# Patient Record
Sex: Female | Born: 1935 | Race: White | Hispanic: No | Smoking: Never smoker
Health system: Southern US, Community
[De-identification: ages and names within clinical notes are randomized; demographics above are authoritative.]

## PROBLEM LIST (undated history)

## (undated) DIAGNOSIS — M6281 Muscle weakness (generalized): Secondary | ICD-10-CM

## (undated) DIAGNOSIS — J9610 Chronic respiratory failure, unspecified whether with hypoxia or hypercapnia: Secondary | ICD-10-CM

## (undated) DIAGNOSIS — F0390 Unspecified dementia without behavioral disturbance: Secondary | ICD-10-CM

## (undated) DIAGNOSIS — K317 Polyp of stomach and duodenum: Secondary | ICD-10-CM

## (undated) DIAGNOSIS — F32A Depression, unspecified: Secondary | ICD-10-CM

## (undated) DIAGNOSIS — R41841 Cognitive communication deficit: Secondary | ICD-10-CM

## (undated) DIAGNOSIS — K219 Gastro-esophageal reflux disease without esophagitis: Secondary | ICD-10-CM

## (undated) DIAGNOSIS — E7800 Pure hypercholesterolemia, unspecified: Secondary | ICD-10-CM

## (undated) DIAGNOSIS — F329 Major depressive disorder, single episode, unspecified: Secondary | ICD-10-CM

## (undated) DIAGNOSIS — J159 Unspecified bacterial pneumonia: Secondary | ICD-10-CM

## (undated) DIAGNOSIS — I4891 Unspecified atrial fibrillation: Principal | ICD-10-CM

## (undated) DIAGNOSIS — G608 Other hereditary and idiopathic neuropathies: Secondary | ICD-10-CM

## (undated) DIAGNOSIS — K5790 Diverticulosis of intestine, part unspecified, without perforation or abscess without bleeding: Secondary | ICD-10-CM

## (undated) DIAGNOSIS — E559 Vitamin D deficiency, unspecified: Secondary | ICD-10-CM

## (undated) DIAGNOSIS — M0520 Rheumatoid vasculitis with rheumatoid arthritis of unspecified site: Secondary | ICD-10-CM

## (undated) DIAGNOSIS — M810 Age-related osteoporosis without current pathological fracture: Secondary | ICD-10-CM

## (undated) DIAGNOSIS — J449 Chronic obstructive pulmonary disease, unspecified: Secondary | ICD-10-CM

## (undated) DIAGNOSIS — K648 Other hemorrhoids: Secondary | ICD-10-CM

## (undated) DIAGNOSIS — I10 Essential (primary) hypertension: Secondary | ICD-10-CM

## (undated) DIAGNOSIS — Z9981 Dependence on supplemental oxygen: Secondary | ICD-10-CM

## (undated) DIAGNOSIS — F411 Generalized anxiety disorder: Secondary | ICD-10-CM

## (undated) DIAGNOSIS — G629 Polyneuropathy, unspecified: Secondary | ICD-10-CM

## (undated) DIAGNOSIS — IMO0002 Reserved for concepts with insufficient information to code with codable children: Secondary | ICD-10-CM

## (undated) DIAGNOSIS — M549 Dorsalgia, unspecified: Secondary | ICD-10-CM

## (undated) DIAGNOSIS — E119 Type 2 diabetes mellitus without complications: Secondary | ICD-10-CM

## (undated) DIAGNOSIS — G4700 Insomnia, unspecified: Secondary | ICD-10-CM

## (undated) DIAGNOSIS — C801 Malignant (primary) neoplasm, unspecified: Secondary | ICD-10-CM

## (undated) DIAGNOSIS — K760 Fatty (change of) liver, not elsewhere classified: Secondary | ICD-10-CM

## (undated) DIAGNOSIS — D649 Anemia, unspecified: Secondary | ICD-10-CM

## (undated) DIAGNOSIS — M797 Fibromyalgia: Secondary | ICD-10-CM

## (undated) HISTORY — DX: Polyp of stomach and duodenum: K31.7

## (undated) HISTORY — DX: Vitamin D deficiency, unspecified: E55.9

## (undated) HISTORY — DX: Other hereditary and idiopathic neuropathies: G60.8

## (undated) HISTORY — DX: Unspecified bacterial pneumonia: J15.9

## (undated) HISTORY — PX: ABDOMINAL SURGERY: SHX537

## (undated) HISTORY — DX: Diverticulosis of intestine, part unspecified, without perforation or abscess without bleeding: K57.90

## (undated) HISTORY — PX: ABDOMINAL HYSTERECTOMY: SHX81

## (undated) HISTORY — PX: UPPER GASTROINTESTINAL ENDOSCOPY: SHX188

## (undated) HISTORY — DX: Generalized anxiety disorder: F41.1

## (undated) HISTORY — DX: Other hemorrhoids: K64.8

## (undated) HISTORY — DX: Gastro-esophageal reflux disease without esophagitis: K21.9

## (undated) HISTORY — DX: Fatty (change of) liver, not elsewhere classified: K76.0

## (undated) HISTORY — DX: Reserved for concepts with insufficient information to code with codable children: IMO0002

---

## 2000-09-12 ENCOUNTER — Encounter (HOSPITAL_COMMUNITY): Admission: RE | Admit: 2000-09-12 | Discharge: 2000-10-12 | Payer: Self-pay | Admitting: Neurological Surgery

## 2000-10-16 ENCOUNTER — Encounter (HOSPITAL_COMMUNITY): Admission: RE | Admit: 2000-10-16 | Discharge: 2000-11-15 | Payer: Self-pay | Admitting: Neurological Surgery

## 2000-12-02 ENCOUNTER — Emergency Department (HOSPITAL_COMMUNITY): Admission: EM | Admit: 2000-12-02 | Discharge: 2000-12-02 | Payer: Self-pay | Admitting: Emergency Medicine

## 2000-12-05 ENCOUNTER — Ambulatory Visit (HOSPITAL_COMMUNITY): Admission: RE | Admit: 2000-12-05 | Discharge: 2000-12-05 | Payer: Self-pay | Admitting: Family Medicine

## 2000-12-05 ENCOUNTER — Encounter: Payer: Self-pay | Admitting: Family Medicine

## 2001-01-31 ENCOUNTER — Encounter: Payer: Self-pay | Admitting: Family Medicine

## 2001-01-31 ENCOUNTER — Ambulatory Visit (HOSPITAL_COMMUNITY): Admission: RE | Admit: 2001-01-31 | Discharge: 2001-01-31 | Payer: Self-pay | Admitting: Family Medicine

## 2001-02-12 ENCOUNTER — Other Ambulatory Visit: Admission: RE | Admit: 2001-02-12 | Discharge: 2001-02-12 | Payer: Self-pay | Admitting: Family Medicine

## 2001-04-14 HISTORY — PX: COLONOSCOPY: SHX174

## 2001-04-17 ENCOUNTER — Ambulatory Visit (HOSPITAL_COMMUNITY): Admission: RE | Admit: 2001-04-17 | Discharge: 2001-04-17 | Payer: Self-pay | Admitting: Internal Medicine

## 2002-05-08 ENCOUNTER — Ambulatory Visit (HOSPITAL_COMMUNITY): Admission: RE | Admit: 2002-05-08 | Discharge: 2002-05-08 | Payer: Self-pay | Admitting: Family Medicine

## 2002-05-08 ENCOUNTER — Encounter: Payer: Self-pay | Admitting: Family Medicine

## 2003-01-20 ENCOUNTER — Ambulatory Visit (HOSPITAL_COMMUNITY): Admission: RE | Admit: 2003-01-20 | Discharge: 2003-01-20 | Payer: Self-pay | Admitting: Family Medicine

## 2003-09-19 ENCOUNTER — Ambulatory Visit (HOSPITAL_COMMUNITY): Admission: RE | Admit: 2003-09-19 | Discharge: 2003-09-19 | Payer: Self-pay | Admitting: Family Medicine

## 2003-12-11 ENCOUNTER — Ambulatory Visit (HOSPITAL_COMMUNITY): Admission: RE | Admit: 2003-12-11 | Discharge: 2003-12-11 | Payer: Self-pay | Admitting: *Deleted

## 2006-03-06 ENCOUNTER — Ambulatory Visit (HOSPITAL_COMMUNITY): Admission: RE | Admit: 2006-03-06 | Discharge: 2006-03-06 | Payer: Self-pay | Admitting: Family Medicine

## 2006-03-09 ENCOUNTER — Ambulatory Visit (HOSPITAL_COMMUNITY): Admission: RE | Admit: 2006-03-09 | Discharge: 2006-03-09 | Payer: Self-pay | Admitting: Family Medicine

## 2006-04-25 ENCOUNTER — Ambulatory Visit (HOSPITAL_COMMUNITY): Admission: RE | Admit: 2006-04-25 | Discharge: 2006-04-25 | Payer: Self-pay | Admitting: Family Medicine

## 2006-06-29 ENCOUNTER — Emergency Department (HOSPITAL_COMMUNITY): Admission: EM | Admit: 2006-06-29 | Discharge: 2006-06-29 | Payer: Self-pay | Admitting: Emergency Medicine

## 2006-07-04 ENCOUNTER — Emergency Department (HOSPITAL_COMMUNITY): Admission: EM | Admit: 2006-07-04 | Discharge: 2006-07-04 | Payer: Self-pay | Admitting: Emergency Medicine

## 2006-07-05 ENCOUNTER — Ambulatory Visit: Payer: Self-pay | Admitting: Orthopedic Surgery

## 2006-07-26 ENCOUNTER — Ambulatory Visit (HOSPITAL_COMMUNITY): Admission: RE | Admit: 2006-07-26 | Discharge: 2006-07-26 | Payer: Self-pay | Admitting: Family Medicine

## 2007-01-04 ENCOUNTER — Ambulatory Visit (HOSPITAL_COMMUNITY): Admission: RE | Admit: 2007-01-04 | Discharge: 2007-01-04 | Payer: Self-pay | Admitting: Family Medicine

## 2007-02-11 ENCOUNTER — Emergency Department (HOSPITAL_COMMUNITY): Admission: EM | Admit: 2007-02-11 | Discharge: 2007-02-11 | Payer: Self-pay | Admitting: Emergency Medicine

## 2007-06-01 ENCOUNTER — Ambulatory Visit (HOSPITAL_COMMUNITY): Admission: RE | Admit: 2007-06-01 | Discharge: 2007-06-01 | Payer: Self-pay | Admitting: Family Medicine

## 2008-03-19 ENCOUNTER — Ambulatory Visit (HOSPITAL_COMMUNITY): Admission: RE | Admit: 2008-03-19 | Discharge: 2008-03-19 | Payer: Self-pay | Admitting: Family Medicine

## 2008-04-12 ENCOUNTER — Inpatient Hospital Stay (HOSPITAL_COMMUNITY): Admission: EM | Admit: 2008-04-12 | Discharge: 2008-04-15 | Payer: Self-pay | Admitting: Emergency Medicine

## 2008-06-11 ENCOUNTER — Ambulatory Visit (HOSPITAL_COMMUNITY): Admission: RE | Admit: 2008-06-11 | Discharge: 2008-06-11 | Payer: Self-pay | Admitting: Ophthalmology

## 2008-07-02 ENCOUNTER — Ambulatory Visit (HOSPITAL_COMMUNITY): Admission: RE | Admit: 2008-07-02 | Discharge: 2008-07-02 | Payer: Self-pay | Admitting: Ophthalmology

## 2008-11-16 ENCOUNTER — Inpatient Hospital Stay (HOSPITAL_COMMUNITY): Admission: EM | Admit: 2008-11-16 | Discharge: 2008-11-19 | Payer: Self-pay | Admitting: Pulmonary Disease

## 2008-11-16 ENCOUNTER — Ambulatory Visit: Payer: Self-pay | Admitting: Pulmonary Disease

## 2008-12-11 ENCOUNTER — Ambulatory Visit (HOSPITAL_COMMUNITY): Admission: RE | Admit: 2008-12-11 | Discharge: 2008-12-11 | Payer: Self-pay | Admitting: Rheumatology

## 2008-12-17 ENCOUNTER — Encounter: Admission: RE | Admit: 2008-12-17 | Discharge: 2008-12-17 | Payer: Self-pay | Admitting: Unknown Physician Specialty

## 2008-12-30 ENCOUNTER — Ambulatory Visit: Admission: RE | Admit: 2008-12-30 | Discharge: 2008-12-30 | Payer: Self-pay | Admitting: Internal Medicine

## 2009-07-06 ENCOUNTER — Emergency Department (HOSPITAL_COMMUNITY): Admission: EM | Admit: 2009-07-06 | Discharge: 2009-07-06 | Payer: Self-pay | Admitting: Emergency Medicine

## 2009-07-07 ENCOUNTER — Emergency Department (HOSPITAL_COMMUNITY): Admission: EM | Admit: 2009-07-07 | Discharge: 2009-07-07 | Payer: Self-pay | Admitting: Emergency Medicine

## 2009-07-23 ENCOUNTER — Ambulatory Visit (HOSPITAL_COMMUNITY): Admission: RE | Admit: 2009-07-23 | Discharge: 2009-07-23 | Payer: Self-pay | Admitting: Internal Medicine

## 2009-07-23 ENCOUNTER — Ambulatory Visit (HOSPITAL_COMMUNITY)
Admission: RE | Admit: 2009-07-23 | Discharge: 2009-07-23 | Payer: Self-pay | Source: Home / Self Care | Admitting: Internal Medicine

## 2009-08-03 ENCOUNTER — Ambulatory Visit (HOSPITAL_COMMUNITY)
Admission: RE | Admit: 2009-08-03 | Discharge: 2009-08-03 | Payer: Self-pay | Source: Home / Self Care | Admitting: Internal Medicine

## 2009-08-05 ENCOUNTER — Ambulatory Visit: Payer: Self-pay | Admitting: Thoracic Surgery

## 2009-10-06 ENCOUNTER — Encounter: Admission: RE | Admit: 2009-10-06 | Discharge: 2009-10-06 | Payer: Self-pay | Admitting: Thoracic Surgery

## 2009-10-06 ENCOUNTER — Ambulatory Visit: Payer: Self-pay | Admitting: Thoracic Surgery

## 2009-10-22 ENCOUNTER — Emergency Department (HOSPITAL_COMMUNITY): Admission: EM | Admit: 2009-10-22 | Discharge: 2009-10-22 | Payer: Self-pay | Admitting: Emergency Medicine

## 2010-02-16 ENCOUNTER — Encounter: Admission: RE | Admit: 2010-02-16 | Discharge: 2010-02-16 | Payer: Self-pay | Admitting: Thoracic Surgery

## 2010-02-16 ENCOUNTER — Ambulatory Visit: Payer: Self-pay | Admitting: Thoracic Surgery

## 2010-04-03 ENCOUNTER — Encounter: Payer: Self-pay | Admitting: Thoracic Surgery

## 2010-04-04 ENCOUNTER — Encounter: Payer: Self-pay | Admitting: Internal Medicine

## 2010-04-05 ENCOUNTER — Encounter: Payer: Self-pay | Admitting: Rheumatology

## 2010-04-11 ENCOUNTER — Inpatient Hospital Stay (HOSPITAL_COMMUNITY)
Admission: EM | Admit: 2010-04-11 | Discharge: 2010-04-16 | DRG: 871 | Disposition: A | Discharge status: Skilled Nursing Facility | Payer: MEDICARE | Attending: Emergency Medicine | Admitting: Emergency Medicine

## 2010-04-11 DIAGNOSIS — G92 Toxic encephalopathy: Secondary | ICD-10-CM | POA: Diagnosis present

## 2010-04-11 DIAGNOSIS — I10 Essential (primary) hypertension: Secondary | ICD-10-CM | POA: Diagnosis not present

## 2010-04-11 DIAGNOSIS — J449 Chronic obstructive pulmonary disease, unspecified: Secondary | ICD-10-CM | POA: Diagnosis present

## 2010-04-11 DIAGNOSIS — J189 Pneumonia, unspecified organism: Secondary | ICD-10-CM | POA: Diagnosis present

## 2010-04-11 DIAGNOSIS — R748 Abnormal levels of other serum enzymes: Secondary | ICD-10-CM | POA: Diagnosis present

## 2010-04-11 DIAGNOSIS — R791 Abnormal coagulation profile: Secondary | ICD-10-CM | POA: Diagnosis present

## 2010-04-11 DIAGNOSIS — G8929 Other chronic pain: Secondary | ICD-10-CM | POA: Diagnosis present

## 2010-04-11 DIAGNOSIS — IMO0002 Reserved for concepts with insufficient information to code with codable children: Secondary | ICD-10-CM

## 2010-04-11 DIAGNOSIS — E2749 Other adrenocortical insufficiency: Secondary | ICD-10-CM | POA: Diagnosis present

## 2010-04-11 DIAGNOSIS — M069 Rheumatoid arthritis, unspecified: Secondary | ICD-10-CM | POA: Diagnosis present

## 2010-04-11 DIAGNOSIS — D649 Anemia, unspecified: Secondary | ICD-10-CM | POA: Diagnosis not present

## 2010-04-11 DIAGNOSIS — D696 Thrombocytopenia, unspecified: Secondary | ICD-10-CM | POA: Diagnosis not present

## 2010-04-11 DIAGNOSIS — K5900 Constipation, unspecified: Secondary | ICD-10-CM | POA: Diagnosis not present

## 2010-04-11 DIAGNOSIS — R5381 Other malaise: Secondary | ICD-10-CM | POA: Diagnosis present

## 2010-04-11 DIAGNOSIS — G929 Unspecified toxic encephalopathy: Secondary | ICD-10-CM | POA: Diagnosis present

## 2010-04-11 DIAGNOSIS — E876 Hypokalemia: Secondary | ICD-10-CM | POA: Diagnosis present

## 2010-04-11 DIAGNOSIS — A419 Sepsis, unspecified organism: Principal | ICD-10-CM | POA: Diagnosis present

## 2010-04-11 DIAGNOSIS — J4489 Other specified chronic obstructive pulmonary disease: Secondary | ICD-10-CM | POA: Diagnosis present

## 2010-04-11 LAB — CBC
Hemoglobin: 13.8 g/dL (ref 12.0–15.0)
MCH: 29.9 pg (ref 26.0–34.0)
MCV: 91.8 fL (ref 78.0–100.0)
RBC: 4.61 MIL/uL (ref 3.87–5.11)

## 2010-04-11 LAB — CARDIAC PANEL(CRET KIN+CKTOT+MB+TROPI)
CK, MB: 1 ng/mL (ref 0.3–4.0)
CK, MB: 1.7 ng/mL (ref 0.3–4.0)
Relative Index: INVALID (ref 0.0–2.5)
Total CK: 38 U/L (ref 7–177)
Troponin I: 0.22 ng/mL — ABNORMAL HIGH (ref 0.00–0.06)
Troponin I: 0.17 ng/mL — ABNORMAL HIGH (ref 0.00–0.06)
Troponin I: 0.08 ng/mL — ABNORMAL HIGH (ref 0.00–0.06)

## 2010-04-11 LAB — COMPREHENSIVE METABOLIC PANEL
AST: 26 U/L (ref 0–37)
BUN: 21 mg/dL (ref 6–23)
CO2: 30 mEq/L (ref 19–32)
Chloride: 95 mEq/L — ABNORMAL LOW (ref 96–112)
Creatinine, Ser: 1.51 mg/dL — ABNORMAL HIGH (ref 0.4–1.2)
GFR calc Af Amer: 40 mL/min — ABNORMAL LOW (ref >60)
GFR calc non Af Amer: 33 mL/min — ABNORMAL LOW (ref >60)
Total Bilirubin: 1 mg/dL (ref 0.3–1.2)

## 2010-04-11 LAB — URINALYSIS, ROUTINE W REFLEX MICROSCOPIC
Nitrite: NEGATIVE
Protein, ur: NEGATIVE mg/dL
Urine Glucose, Fasting: NEGATIVE mg/dL
Urobilinogen, UA: 0.2 mg/dL (ref 0.0–1.0)

## 2010-04-11 LAB — DIFFERENTIAL
Basophils Relative: 0 % (ref 0–1)
Lymphocytes Relative: 14 % (ref 12–46)
Monocytes Relative: 3 % (ref 3–12)
Neutro Abs: 10.6 10*3/uL — ABNORMAL HIGH (ref 1.7–7.7)

## 2010-04-11 LAB — PROCALCITONIN: Procalcitonin: 6.54 ng/mL

## 2010-04-11 LAB — APTT: aPTT: 27 seconds (ref 24–37)

## 2010-04-11 LAB — PROTIME-INR
INR: 1.01 (ref 0.00–1.49)
Prothrombin Time: 13.5 seconds (ref 11.6–15.2)

## 2010-04-11 LAB — LACTIC ACID, PLASMA: Lactic Acid, Venous: 1.7 mmol/L (ref 0.5–2.2)

## 2010-04-12 LAB — DIFFERENTIAL
Basophils Absolute: 0 10*3/uL (ref 0.0–0.1)
Basophils Relative: 0 % (ref 0–1)
Eosinophils Absolute: 0 10*3/uL (ref 0.0–0.7)
Neutro Abs: 9.8 10*3/uL — ABNORMAL HIGH (ref 1.7–7.7)
Neutrophils Relative %: 91 % — ABNORMAL HIGH (ref 43–77)

## 2010-04-12 LAB — CBC
Hemoglobin: 9.9 g/dL — ABNORMAL LOW (ref 12.0–15.0)
Platelets: 112 10*3/uL — ABNORMAL LOW (ref 150–400)
RBC: 3.25 MIL/uL — ABNORMAL LOW (ref 3.87–5.11)

## 2010-04-12 LAB — BASIC METABOLIC PANEL
Chloride: 108 mEq/L (ref 96–112)
GFR calc non Af Amer: 41 mL/min — ABNORMAL LOW (ref >60)
Glucose, Bld: 159 mg/dL — ABNORMAL HIGH (ref 70–99)
Potassium: 4.1 mEq/L (ref 3.5–5.1)
Sodium: 138 mEq/L (ref 135–145)

## 2010-04-12 LAB — CARDIAC PANEL(CRET KIN+CKTOT+MB+TROPI)
CK, MB: 2.6 ng/mL (ref 0.3–4.0)
Total CK: 34 U/L (ref 7–177)

## 2010-04-12 LAB — GLUCOSE, CAPILLARY

## 2010-04-13 LAB — CBC
HCT: 29.2 % — ABNORMAL LOW (ref 36.0–46.0)
MCHC: 33.6 g/dL (ref 30.0–36.0)
MCV: 90.1 fL (ref 78.0–100.0)
RDW: 15.8 % — ABNORMAL HIGH (ref 11.5–15.5)

## 2010-04-13 LAB — DIFFERENTIAL
Eosinophils Relative: 1 % (ref 0–5)
Lymphocytes Relative: 17 % (ref 12–46)
Lymphs Abs: 1.4 10*3/uL (ref 0.7–4.0)
Monocytes Absolute: 0.5 10*3/uL (ref 0.1–1.0)
Monocytes Relative: 6 % (ref 3–12)

## 2010-04-13 LAB — BASIC METABOLIC PANEL
BUN: 15 mg/dL (ref 6–23)
Chloride: 113 mEq/L — ABNORMAL HIGH (ref 96–112)
Glucose, Bld: 118 mg/dL — ABNORMAL HIGH (ref 70–99)
Potassium: 3.2 mEq/L — ABNORMAL LOW (ref 3.5–5.1)

## 2010-04-13 LAB — GLUCOSE, CAPILLARY

## 2010-04-13 LAB — URINE CULTURE

## 2010-04-14 LAB — CBC
HCT: 31 % — ABNORMAL LOW (ref 36.0–46.0)
Hemoglobin: 10.3 g/dL — ABNORMAL LOW (ref 12.0–15.0)
MCH: 30 pg (ref 26.0–34.0)
MCHC: 33.2 g/dL (ref 30.0–36.0)
RBC: 3.43 MIL/uL — ABNORMAL LOW (ref 3.87–5.11)

## 2010-04-14 LAB — BASIC METABOLIC PANEL
CO2: 25 mEq/L (ref 19–32)
GFR calc non Af Amer: 46 mL/min — ABNORMAL LOW (ref >60)
Glucose, Bld: 87 mg/dL (ref 70–99)
Potassium: 4.1 mEq/L (ref 3.5–5.1)
Sodium: 142 mEq/L (ref 135–145)

## 2010-04-15 ENCOUNTER — Inpatient Hospital Stay (HOSPITAL_COMMUNITY)
Admission: EM | Admit: 2010-04-15 | Discharge: 2010-04-15 | Disposition: A | Discharge status: Home / Self Care | Payer: MEDICARE | Source: Home / Self Care

## 2010-04-15 ENCOUNTER — Encounter (HOSPITAL_COMMUNITY): Payer: Self-pay

## 2010-04-15 LAB — CBC
MCH: 30.4 pg (ref 26.0–34.0)
MCHC: 34 g/dL (ref 30.0–36.0)
MCV: 89.5 fL (ref 78.0–100.0)
Platelets: 118 10*3/uL — ABNORMAL LOW (ref 150–400)
RDW: 16.2 % — ABNORMAL HIGH (ref 11.5–15.5)

## 2010-04-15 LAB — DIFFERENTIAL
Basophils Relative: 0 % (ref 0–1)
Eosinophils Absolute: 0.2 10*3/uL (ref 0.0–0.7)
Eosinophils Relative: 2 % (ref 0–5)
Lymphs Abs: 1.2 10*3/uL (ref 0.7–4.0)
Monocytes Absolute: 0.5 10*3/uL (ref 0.1–1.0)
Monocytes Relative: 7 % (ref 3–12)
Neutrophils Relative %: 73 % (ref 43–77)

## 2010-04-15 LAB — BASIC METABOLIC PANEL
CO2: 28 mEq/L (ref 19–32)
Calcium: 9.2 mg/dL (ref 8.4–10.5)
Chloride: 106 mEq/L (ref 96–112)
Glucose, Bld: 96 mg/dL (ref 70–99)
Sodium: 141 mEq/L (ref 135–145)

## 2010-04-15 LAB — HEPATIC FUNCTION PANEL
ALT: 15 U/L (ref 0–35)
AST: 21 U/L (ref 0–37)
Alkaline Phosphatase: 36 U/L — ABNORMAL LOW (ref 39–117)
Bilirubin, Direct: 0.1 mg/dL (ref 0.0–0.3)
Total Bilirubin: 0.5 mg/dL (ref 0.3–1.2)

## 2010-04-15 LAB — VITAMIN B12: Vitamin B-12: 558 pg/mL (ref 211–911)

## 2010-04-16 ENCOUNTER — Inpatient Hospital Stay
Admission: AD | Admit: 2010-04-16 | Discharge: 2010-05-07 | Disposition: A | Discharge status: Home / Self Care | Payer: MEDICARE | Source: Ambulatory Visit | Attending: Internal Medicine | Admitting: Internal Medicine

## 2010-04-16 DIAGNOSIS — R52 Pain, unspecified: Principal | ICD-10-CM

## 2010-04-16 LAB — DIFFERENTIAL
Basophils Absolute: 0.1 10*3/uL (ref 0.0–0.1)
Lymphocytes Relative: 27 % (ref 12–46)
Lymphs Abs: 1.9 10*3/uL (ref 0.7–4.0)
Neutro Abs: 4.1 10*3/uL (ref 1.7–7.7)

## 2010-04-16 LAB — BASIC METABOLIC PANEL
Calcium: 9.7 mg/dL (ref 8.4–10.5)
GFR calc Af Amer: 55 mL/min — ABNORMAL LOW (ref >60)
GFR calc non Af Amer: 45 mL/min — ABNORMAL LOW (ref >60)
Glucose, Bld: 90 mg/dL (ref 70–99)
Potassium: 3.9 mEq/L (ref 3.5–5.1)
Sodium: 141 mEq/L (ref 135–145)

## 2010-04-16 LAB — CBC
HCT: 33 % — ABNORMAL LOW (ref 36.0–46.0)
Hemoglobin: 11 g/dL — ABNORMAL LOW (ref 12.0–15.0)
MCV: 89.4 fL (ref 78.0–100.0)
RBC: 3.69 MIL/uL — ABNORMAL LOW (ref 3.87–5.11)
WBC: 6.9 10*3/uL (ref 4.0–10.5)

## 2010-04-16 LAB — IRON AND TIBC
Saturation Ratios: 21 % (ref 20–55)
TIBC: 230 ug/dL — ABNORMAL LOW (ref 250–470)

## 2010-04-16 NOTE — Discharge Summary (Signed)
[name], [name]            ACCOUNT NO.:  [id number]  MEDICAL RECORD NO.:  [id number]           PATIENT TYPE:  I  LOCATION:  [id number]                          FACILITY:  [facility]  PHYSICIAN:  Rexene Alberts, M.D.    DATE OF BIRTH:  [birth year]  DATE OF ADMISSION:  04/11/2010 DATE OF DISCHARGE:  02/03/2012LH                         DISCHARGE SUMMARY-REFERRING   DISCHARGE DIAGNOSES: 1. Right lung pneumonia. 2. Septic shock. 3. Adrenal crisis. 4. Elevated troponin I, likely secondary to acute illnesses.  The 2-D     echocardiogram on March 26, 2010 revealed no regional wall motion     abnormalities. 5. Hypokalemia. 6. Constipation. 7. Hypertension. 8. Thrombocytopenia, likely secondary to the infection.  The     patient's platelet count was 164.  It fell to a nadir of 112.  It     was 140 prior to discharge. 9. Normocytic anemia.  The patient's anemia panel revealed a total     iron of 49, TIBC of 230, percent saturation of 21, vitamin B12 of     558, folate of greater than 20, and ferritin of 100.  The patient's     hemoglobin was 11.0 prior to discharge. 10.Elevated D-dimer, secondary to sepsis.  Bilateral lower extremity     venous Doppler was negative for deep vein thrombosis.  DISCHARGE MEDICATIONS: 1. Ceftin 500 mg b.i.d. for 4 more days. 2. Azithromycin 250 mg daily for 4 more days. 3. Vitamin C 500 mg daily. 4. Aspirin 81 mg daily. 5. Dulcolax suppository per rectum daily as needed for constipation. 6. Vitamin D3 100 units daily. 7. Vitamin B12 100 mcg daily. 8. Cymbalta 60 mg daily. 9. Fentanyl patch 25 mg every 72 hours. 10.Ferrous sulfate 325 mg daily. 11.Advair Diskus 250/50 one puff b.i.d. 12.Folic acid 1 mg daily. 13.HCTZ 25 mg daily. 14.Lorazepam 0.5 mg b.i.d. 15.Multivitamin 1 tablet daily. 16.Benicar 40 mg daily. 17.Protonix 40 mg b.i.d. 18.MiraLax laxative 17 grams daily. 19.Prednisone 5 mg daily. 20.Lyrica 75 mg b.i.d. 21.Crestor 20 mg at  bedtime. 22.Florastor 250 mg 1 capsule daily. 23.Methotrexate 2.5 mg 5 tablets weekly on Thursdays. 24.Trazodone 50 mg 1 or 2 tablets at bedtime p.r.n. for insomnia. 25.ProAir 2 puffs every 4 hours as needed for shortness of breath and     wheezing. 26.Hydrocodone/APAP 5/500 mg 1 to 2 tablets every 4 hours as needed     for pain. 27.Senokot-S 2 tablets at bedtime.  DISCHARGE DISPOSITION:  The patient was discharged to [facility] in improved and stable condition on April 16, 2010.  CONSULTATIONS:  None.  PROCEDURES PERFORMED: 1. Chest x-ray on April 15, 2010.  The results revealed persistent     asymmetric bibasilar infiltrates or edema, right worse than left. 2. Abdominal x-ray on April 15, 2010.  The results revealed     nonobstructive bowel gas pattern with moderate proximal colonic     fecal material.  No free air. 3. Left upper extremity PICC line on April 13, 2010.  Removed on the     date of discharge, April 16, 2010. 4. 2-D echocardiogram on March 26, 2010.  The results revealed left  ventricular cavity size was normal.  Wall thickness was increased     in the pattern of mild LVH.  Systolic function was normal.     Ejection fraction in the range of 55-60%.  Wall motion was normal.     There was no regional wall motion abnormalities.  Mild aortic valve     stenosis. 5. Bilateral lower extremity venous ultrasound on March 26, 2010.     The results revealed negative exam for deep vein thromboses in both     bilateral lower extremities.  HISTORY OF PRESENT ILLNESS:  The patient is a 77 year old woman with a past medical history significant for rheumatoid arthritis, chronic pain syndrome, hypertension, and COPD.  She presented to the emergency department on April 11, 2010 with a chief complaint of generalized weakness.  When she was initially evaluated, she was noted to be borderline febrile with a temperature of 99.2 and hypotensive with a blood pressure  of 87/47.  She was tachycardic with a heart rate of 124 beats per minute.  She was oxygenating 91%.  Her EKG revealed sinus tachycardia with PACs, nonspecific ST abnormalities, and with a heart rate of 122 beats per minute.  Her chest x-ray revealed patchy opacities in the right mid lung and right lung base, suspicious for pneumonia. Her white blood cell count was elevated at 12.8.  Her lactic acid was within normal limits at 1.7.  Her troponin I was elevated at 0.22.  She was admitted for further evaluation and management.  HOSPITAL COURSE: 1. PNEUMONIA, SEPTIC SHOCK, AND ADRENAL CRISIS.  Blood cultures were     ordered on admission.  They remained without growth.  She received     intravenous Rocephin and azithromycin in the emergency department.     She also received a bolus of IV fluids.  Following admission, she     was started on vancomycin and cefepime empirically.  Intravenous     hydrocortisone was added for probable adrenal crisis in the     setting of chronic prednisone therapy.  IV fluid hydration was     administered.  During the first 24 hours to 36 hours of the     hospitalization, the patient's blood pressure improved     significantly.  Benicar and HCTZ, which had been withheld, were     eventually restarted.  The hydrocortisone was tapered off in favor     of prednisone, which was eventually tapered down to her prehospital     dose of 5 mg daily.  A follow-up chest x-ray     revealed persistent infiltrates.  She remained     afebrile.  Her white blood cell count normalized to 6.9.  Her     strength improved.  However, she was somewhat deconditioned.  She     received a total of 5 days of antibiotic treatment with intravenous     vancomycin and cefepime initially, then azithromycin and Ceftin for     the past few days.  She was discharged on 4 more days of     Ceftin and azithromycin. 2. HYPERTENSION.  The patient was hypotensive on admission as stated     above.   However, her blood pressure quickly rebounded.  She became     hypertensive.  Benicar and HCTZ were restarted at their prehospital     doses.  The patient is still moderately hypertensive.  Over the     next several days, her blood pressure will need to be  monitored     closely.  She may require an additional antihypertensive     medication. 3. ELEVATED TROPONIN I.  The patient's troponin I was elevated on     admission.  Her CK and CK-MB were both within normal limits.  Her     troponin I did eventually normalized to 0.06.  A 2-D echocardiogram     was ordered for evaluation.  It revealed no regional wall motion     abnormalities.  Her left ventricular systolic function was well     within normal limits.  The elevated troponin I was likely the     consequence of septic shock. 4. ELEVATED D-DIMER.  The patient's D-dimer was elevated.  This was     thought to be the consequence of sepsis.  However, to rule out DVT,     bilateral lower extremity venous ultrasound was ordered.  The     results were negative for bilateral DVT. 5. THROMBOCYTOPENIA.  The patient's platelet count was 164 on     admission.  It fell to 112.  It rebounded to 140 prior to     discharge.  The thrombocytopenia was likely secondary to the     dilutional effects of the IV fluids, sepsis, and possibly     medication side effects.  Her platelet count will need to be     monitored periodically. 6. NORMOCYTIC ANEMIA.  The patient's hemoglobin was 13.8 on admission.     It fell gradually to 10.3 and then rebounded to 11.0 prior to     discharge.  An anemia panel was ordered.  The results were dictated     above.  The patient was maintained on iron and vitamin therapy. 7. RHEUMATOID ARTHRITIS.  The patient was treated with stress doses of     intravenous steroids initially.  She was tapered to prednisone 5 mg     daily, which is her usual home dose.  Methotrexate was withheld     during the hospital course.  However, it  will be restarted     following hospital discharge. 8. MILD ABDOMINAL PAIN, likely secondary to constipation.  The patient     complained of nonspecific abdominal discomfort.  Her lipase was     within normal limits at 29.  Her liver transaminases were within     normal limits.  The x-ray of her abdomen revealed constipation.     She was started on laxatives.  She was given a suppository.     Rexene Alberts, M.D.     DF/MEDQ  D:  04/16/2010  T:  04/16/2010  Job:  [id number]  cc:   Delphina Cahill, M.D. [phone]  Electronically Signed by Rexene Alberts M.D. on 04/16/2010 04:26:10 PM

## 2010-04-17 LAB — CULTURE, BLOOD (ROUTINE X 2)
Culture: NO GROWTH
Culture: NO GROWTH

## 2010-04-27 ENCOUNTER — Ambulatory Visit (HOSPITAL_COMMUNITY): Payer: MEDICARE | Attending: Internal Medicine

## 2010-04-27 DIAGNOSIS — M25519 Pain in unspecified shoulder: Secondary | ICD-10-CM | POA: Insufficient documentation

## 2010-04-27 DIAGNOSIS — M545 Low back pain, unspecified: Secondary | ICD-10-CM | POA: Insufficient documentation

## 2010-04-27 DIAGNOSIS — M51379 Other intervertebral disc degeneration, lumbosacral region without mention of lumbar back pain or lower extremity pain: Secondary | ICD-10-CM | POA: Insufficient documentation

## 2010-04-27 DIAGNOSIS — M5137 Other intervertebral disc degeneration, lumbosacral region: Secondary | ICD-10-CM | POA: Insufficient documentation

## 2010-05-09 NOTE — H&P (Signed)
[name], Becky            ACCOUNT NO.:  [id number]  MEDICAL RECORD NO.:  [id number]          PATIENT TYPE:  INP  LOCATION:  IC10                          FACILITY:  [facility]  PHYSICIAN:  Taivon Haroon L. Conley Canal, MDDATE OF BIRTH:  Jan 23, 1929  DATE OF ADMISSION:  04/11/2010 DATE OF DISCHARGE:  LH                             HISTORY & PHYSICAL   CHIEF COMPLAINT:  Weakness.  HISTORY OF PRESENT ILLNESS:  [name] is a 77 year old white female with multiple medical problems including rheumatoid arthritis, maintained on steroids and methotrexate.  She is brought to the emergency room by her daughter who provides most of the history.  Also I have reviewed E-chart and some history is gleaned from old records.  The patient reportedly was in her usual state of health until last night. The daughter reports that her mother reportedly did not feel well today. The patient lives alone.  When the daughter arrived, she was still in bed this morning which is unusual for her.  She seemed confused and sleepy.  She also had substernal chest pain that started last night and currently is gone.  The patient is currently quite groggy and unable to provide much history.  She does report that the pain is worse with inspiration currently but cannot qualify it further.  She has had a cough with productive sputum.  She is unable to tell me any further with respect to sputum quantity or quality.  She had chills starting last night.  She feels extremely weak.  She usually uses a walker but was unable to do that today.  She required assistance to even stand.  She had hypotension with systolic in the [id number] and tachycardia into 120s on arrival in the emergency room.  She had a fentanyl patch which has been removed.  She was found to have pneumonia and has received 2 liters of saline.  Her blood pressure is currently 90/40.  She takes 5 mg of prednisone daily.  She has a history of pneumonia with severe  septic shock just over a year ago.  Her code status is full.  She has received Rocephin and azithromycin in the emergency room.  She also had a slightly elevated troponin at 0.12 on cardiac markers.  Her EKG shows no acute changes.  PAST MEDICAL HISTORY:  As above.  Also hypertension, dyslipidemia, gastroesophageal reflux disease, COPD, anxiety, fibromyalgia, cataracts, chronic back and knee pain due to rheumatoid arthritis.  Hypertension.  MEDICATIONS: 1. Cymbalta 60 mg p.o. daily. 2. Benicar HCT 40/25 mg a day. 3. Crestor 20 mg a day. 4. Prednisone 5 mg a day. 5. Trazodone 100 mg nightly. 6. Lyrica 75 mg twice a day. 7. Lorazepam 0.5 mg twice a day and as needed. 8. Protonix 40 mg twice a day. 9. Methotrexate 2.5 mg 5 tablets each Thursday. 10.Advair 250/50, 1 puff b.i.d. 11.Albuterol inhaler 2 puffs every 4 hours as needed for wheezing. 12.Vitamin B12. 13.Vitamin C. 14.Vitamin D3. 15.Iron. 16.Folic acid. 17.Multivitamin. 18.Aspirin 81 mg a day. 19.Probiotics. 20.Duragesic 25 mcg every 3 days. 21.Vicodin as needed which is usually at least once a day.  She has a reported allergy  to Thor.  SOCIAL HISTORY:  Reviewed and as per previous H&P.  She still lives alone but she has elder care involved and her daughter checks on her frequently.  Her code status is full but she would not want to be maintained on life prolonging measures if terminally ill.  She does not drink or smoke.  FAMILY HISTORY:  Reviewed and as per previous.  REVIEW OF SYSTEMS:  Reviewed as above, otherwise difficult due to the patient's somnolence.  PHYSICAL EXAMINATION:  VITAL SIGNS:  Temperature is 99.2, blood pressure initially 87/47, dropped to a low of 72/44 and currently 90/40.  Heart rate initially 120, currently 95, respiratory rate 24, oxygen saturation initially 91% on room air.  Currently 97% on 2 liters nasal cannula. GENERAL:  The patient is sleepy but arousable elderly  female. HEENT:  Pupils are equal, round, reactive to light.  Slightly dry mucous membranes. NECK:  Supple.  No lymphadenopathy. LUNGS:  Clear to auscultation bilaterally without wheezes, rhonchi, or rales. CARDIOVASCULAR:  Regular rate and rhythm without murmurs, gallops, or rubs. ABDOMEN:  Obese, soft, nontender. GU:  She has Foley catheter draining clear yellow urine. RECTAL:  Deferred. EXTREMITIES:  No clubbing, cyanosis, or edema.  Pulses are intact. NEUROLOGIC:  She is somnolent but arousable.  She is moving all extremities and has no obvious cranial nerve deficits other than her chronic hearing impairment.  She will answer an occasional question and follow an occasional command, though not reliably.  SKIN:  No rash.  She does have a skin tear on her left forearm.  LABORATORY DATA:  White blood cell count is 12,800.  The rest of her CBC is unremarkable.  Sodium 141, potassium 3.4, chloride 95, bicarbonate 30, glucose 119, BUN 21, creatinine 1.51.  Liver function tests normal. Venous lactic acid normal.  Myoglobin 297, CPK-MB less than 1.  Troponin 0.12 on cardiac markers.  Urinalysis shows a specific gravity 1.020, hazy, otherwise normal.  EKG shows sinus tachycardia with a rate of 122, PACs and wandering baseline.  Nonspecific changes.  Chest x-ray shows patchy opacity in right mid lung and right lung base.  ASSESSMENT/PLAN: 1. Pneumonia:  In the setting of immunosuppression, I will give her     broad spectrum antibiotics in the form of vancomycin, cefepime, and     azithromycin.  Blood cultures have been drawn.  She will be     admitted to the intensive care unit.  I will check a pro calcitonin     level. 2. Septic shock, blood pressure currently slightly improved, though     borderline:  I will give another liter of saline and hydrocortisone     100 mg IV.  She will be monitored in the intensive care unit.  Her     antihypertensives of course will be held.  Keep her  fentanyl patch     off. 3. Rheumatoid arthritis with chronic immunosuppression and chronic     steroids, see above. 4. Toxic encephalopathy.  No evidence of focal findings. 5. Hypokalemia.  This will be repleted IV. 6. Hypertension. 7. Chest pain with abnormal troponin:  I will continue the patient's     aspirin.  She has no chest pain currently.  I will cycle cardiac     enzymes and get an echocardiogram to look for wall motion     abnormality.  Her chest pain may be due to the pneumonia.  I will     repeat an EKG.  Her  hypotension precludes the use of beta-blockers.     For now I will give DVT prophylaxis, but should she rule in, switch     to ACS dose Lovenox. 8. Gastroesophageal reflux disease.  Continue proton pump inhibitor. 9. Hyperlipidemia.  Continue Crestor. 10.Anxiety with chronic benzodiazepines:  She will get lorazepam as     needed. 11.Fibromyalgia. 12.History of cataracts.  Total critical care time is 65 minutes.     Job Holtsclaw L. Conley Canal, MD     CLS/MEDQ  D:  04/11/2010  T:  04/12/2010  Job:  [id number]  cc:   Delphina Cahill, M.D. [phone]  Electronically Signed by Doree Barthel MD on 05/09/2010 09:25:47 PM

## 2010-05-11 ENCOUNTER — Other Ambulatory Visit (HOSPITAL_COMMUNITY): Payer: Self-pay | Admitting: Internal Medicine

## 2010-05-11 ENCOUNTER — Ambulatory Visit (HOSPITAL_COMMUNITY)
Admission: RE | Admit: 2010-05-11 | Discharge: 2010-05-11 | Disposition: A | Discharge status: Home / Self Care | Payer: MEDICARE | Source: Ambulatory Visit | Attending: Internal Medicine | Admitting: Internal Medicine

## 2010-05-11 DIAGNOSIS — J189 Pneumonia, unspecified organism: Secondary | ICD-10-CM

## 2010-05-11 DIAGNOSIS — R0602 Shortness of breath: Secondary | ICD-10-CM | POA: Insufficient documentation

## 2010-05-11 DIAGNOSIS — R918 Other nonspecific abnormal finding of lung field: Secondary | ICD-10-CM | POA: Insufficient documentation

## 2010-06-01 LAB — DIFFERENTIAL
Basophils Relative: 0 % (ref 0–1)
Eosinophils Relative: 2 % (ref 0–5)
Monocytes Absolute: 0.6 10*3/uL (ref 0.1–1.0)
Monocytes Relative: 7 % (ref 3–12)
Neutro Abs: 6.9 10*3/uL (ref 1.7–7.7)

## 2010-06-01 LAB — BASIC METABOLIC PANEL
CO2: 30 mEq/L (ref 19–32)
Calcium: 9.5 mg/dL (ref 8.4–10.5)
Chloride: 104 mEq/L (ref 96–112)
GFR calc Af Amer: 48 mL/min — ABNORMAL LOW (ref >60)
Glucose, Bld: 144 mg/dL — ABNORMAL HIGH (ref 70–99)
Potassium: 3.4 mEq/L — ABNORMAL LOW (ref 3.5–5.1)
Sodium: 140 mEq/L (ref 135–145)

## 2010-06-01 LAB — CBC
HCT: 33.6 % — ABNORMAL LOW (ref 36.0–46.0)
Hemoglobin: 11.5 g/dL — ABNORMAL LOW (ref 12.0–15.0)
MCHC: 34.3 g/dL (ref 30.0–36.0)
MCV: 91.2 fL (ref 78.0–100.0)
RBC: 3.68 MIL/uL — ABNORMAL LOW (ref 3.87–5.11)
RDW: 15.4 % (ref 11.5–15.5)

## 2010-06-01 LAB — POCT CARDIAC MARKERS
CKMB, poc: <1 ng/mL — ABNORMAL LOW (ref 1.0–8.0)
Troponin i, poc: <0.05 ng/mL (ref 0.00–0.09)

## 2010-06-18 LAB — TSH: TSH: 0.618 u[IU]/mL (ref 0.350–4.500)

## 2010-06-18 LAB — BLOOD GAS, ARTERIAL
Acid-Base Excess: 3.8 mmol/L — ABNORMAL HIGH (ref 0.0–2.0)
Bicarbonate: 28.5 mEq/L — ABNORMAL HIGH (ref 20.0–24.0)
O2 Saturation: 94.4 %
TCO2: 26.1 mmol/L (ref 0–100)
pO2, Arterial: 68.6 mmHg — ABNORMAL LOW (ref 80.0–100.0)

## 2010-06-18 LAB — URINALYSIS, ROUTINE W REFLEX MICROSCOPIC
Glucose, UA: 250 mg/dL — AB
Hgb urine dipstick: NEGATIVE
Protein, ur: NEGATIVE mg/dL

## 2010-06-18 LAB — BASIC METABOLIC PANEL
BUN: 17 mg/dL (ref 6–23)
CO2: 27 mEq/L (ref 19–32)
CO2: 31 mEq/L (ref 19–32)
Calcium: 8.8 mg/dL (ref 8.4–10.5)
Calcium: 9.7 mg/dL (ref 8.4–10.5)
Creatinine, Ser: 1.19 mg/dL (ref 0.4–1.2)
Creatinine, Ser: 1.43 mg/dL — ABNORMAL HIGH (ref 0.4–1.2)
GFR calc Af Amer: 53 mL/min — ABNORMAL LOW (ref >60)
Glucose, Bld: 327 mg/dL — ABNORMAL HIGH (ref 70–99)

## 2010-06-18 LAB — COMPREHENSIVE METABOLIC PANEL
ALT: 19 U/L (ref 0–35)
ALT: 19 U/L (ref 0–35)
AST: 18 U/L (ref 0–37)
Alkaline Phosphatase: 45 U/L (ref 39–117)
BUN: 32 mg/dL — ABNORMAL HIGH (ref 6–23)
CO2: 31 mEq/L (ref 19–32)
CO2: 28 mEq/L (ref 19–32)
Calcium: 8.4 mg/dL (ref 8.4–10.5)
GFR calc Af Amer: 42 mL/min — ABNORMAL LOW (ref >60)
GFR calc non Af Amer: 28 mL/min — ABNORMAL LOW (ref >60)
GFR calc non Af Amer: 35 mL/min — ABNORMAL LOW (ref >60)
Glucose, Bld: 162 mg/dL — ABNORMAL HIGH (ref 70–99)
Potassium: 3.6 mEq/L (ref 3.5–5.1)
Sodium: 136 mEq/L (ref 135–145)
Sodium: 136 mEq/L (ref 135–145)

## 2010-06-18 LAB — CBC
HCT: 34.6 % — ABNORMAL LOW (ref 36.0–46.0)
Hemoglobin: 11.9 g/dL — ABNORMAL LOW (ref 12.0–15.0)
Hemoglobin: 10.5 g/dL — ABNORMAL LOW (ref 12.0–15.0)
MCHC: 34.3 g/dL (ref 30.0–36.0)
MCHC: 32.7 g/dL (ref 30.0–36.0)
MCHC: 33 g/dL (ref 30.0–36.0)
MCHC: 33.1 g/dL (ref 30.0–36.0)
Platelets: 159 10*3/uL (ref 150–400)
RBC: 3.81 MIL/uL — ABNORMAL LOW (ref 3.87–5.11)
RBC: 2.63 MIL/uL — ABNORMAL LOW (ref 3.87–5.11)
RBC: 3.59 MIL/uL — ABNORMAL LOW (ref 3.87–5.11)
RDW: 16.6 % — ABNORMAL HIGH (ref 11.5–15.5)
RDW: 16.7 % — ABNORMAL HIGH (ref 11.5–15.5)
WBC: 16.7 10*3/uL — ABNORMAL HIGH (ref 4.0–10.5)

## 2010-06-18 LAB — DIFFERENTIAL
Basophils Absolute: 0.1 10*3/uL (ref 0.0–0.1)
Basophils Relative: 1 % (ref 0–1)
Eosinophils Absolute: 0.1 10*3/uL (ref 0.0–0.7)
Eosinophils Absolute: 0.1 10*3/uL (ref 0.0–0.7)
Eosinophils Relative: 1 % (ref 0–5)
Lymphs Abs: 1.2 10*3/uL (ref 0.7–4.0)
Monocytes Relative: 2 % — ABNORMAL LOW (ref 3–12)
Neutrophils Relative %: 85 % — ABNORMAL HIGH (ref 43–77)

## 2010-06-18 LAB — D-DIMER, QUANTITATIVE
D-Dimer, Quant: 2.59 ug/mL-FEU — ABNORMAL HIGH (ref 0.00–0.48)
D-Dimer, Quant: 3.08 ug/mL-FEU — ABNORMAL HIGH (ref 0.00–0.48)

## 2010-06-18 LAB — GLUCOSE, CAPILLARY
Glucose-Capillary: 127 mg/dL — ABNORMAL HIGH (ref 70–99)
Glucose-Capillary: 148 mg/dL — ABNORMAL HIGH (ref 70–99)
Glucose-Capillary: 151 mg/dL — ABNORMAL HIGH (ref 70–99)
Glucose-Capillary: 186 mg/dL — ABNORMAL HIGH (ref 70–99)

## 2010-06-18 LAB — TYPE AND SCREEN
ABO/RH(D): O POS
Antibody Screen: NEGATIVE

## 2010-06-18 LAB — STREP PNEUMONIAE URINARY ANTIGEN: Strep Pneumo Urinary Antigen: NEGATIVE

## 2010-06-18 LAB — CULTURE, BLOOD (ROUTINE X 2): Culture: NO GROWTH

## 2010-06-18 LAB — LEGIONELLA ANTIGEN, URINE

## 2010-06-18 LAB — CARBOXYHEMOGLOBIN: Carboxyhemoglobin: 2.3 % — ABNORMAL HIGH (ref 0.5–1.5)

## 2010-06-18 LAB — CARDIAC PANEL(CRET KIN+CKTOT+MB+TROPI)
CK, MB: 1.1 ng/mL (ref 0.3–4.0)
Relative Index: INVALID (ref 0.0–2.5)
Total CK: 29 U/L (ref 7–177)

## 2010-06-18 LAB — URINE CULTURE: Colony Count: NO GROWTH

## 2010-06-18 LAB — LACTIC ACID, PLASMA: Lactic Acid, Venous: 1.7 mmol/L (ref 0.5–2.2)

## 2010-06-18 LAB — POCT I-STAT 3, ART BLOOD GAS (G3+)
O2 Saturation: 94 %
TCO2: 27 mmol/L (ref 0–100)

## 2010-06-24 LAB — BASIC METABOLIC PANEL
BUN: 17 mg/dL (ref 6–23)
CO2: 31 mEq/L (ref 19–32)
Chloride: 99 mEq/L (ref 96–112)
Glucose, Bld: 133 mg/dL — ABNORMAL HIGH (ref 70–99)
Potassium: 3.6 mEq/L (ref 3.5–5.1)
Sodium: 139 mEq/L (ref 135–145)

## 2010-06-28 LAB — DIFFERENTIAL
Basophils Absolute: 0 10*3/uL (ref 0.0–0.1)
Eosinophils Absolute: 0 10*3/uL (ref 0.0–0.7)
Eosinophils Relative: 1 % (ref 0–5)
Lymphocytes Relative: 9 % — ABNORMAL LOW (ref 12–46)
Lymphs Abs: 0.9 10*3/uL (ref 0.7–4.0)
Neutrophils Relative %: 86 % — ABNORMAL HIGH (ref 43–77)

## 2010-06-28 LAB — COMPREHENSIVE METABOLIC PANEL
ALT: 15 U/L (ref 0–35)
AST: 19 U/L (ref 0–37)
CO2: 25 mEq/L (ref 19–32)
Calcium: 9.2 mg/dL (ref 8.4–10.5)
Chloride: 98 mEq/L (ref 96–112)
Creatinine, Ser: 1.69 mg/dL — ABNORMAL HIGH (ref 0.4–1.2)
GFR calc non Af Amer: 29 mL/min — ABNORMAL LOW (ref >60)
Glucose, Bld: 168 mg/dL — ABNORMAL HIGH (ref 70–99)
Total Bilirubin: 1 mg/dL (ref 0.3–1.2)

## 2010-06-28 LAB — URINALYSIS, ROUTINE W REFLEX MICROSCOPIC
Bilirubin Urine: NEGATIVE
Ketones, ur: NEGATIVE mg/dL
Nitrite: NEGATIVE
Urobilinogen, UA: 0.2 mg/dL (ref 0.0–1.0)
pH: 5 (ref 5.0–8.0)

## 2010-06-28 LAB — CBC
HCT: 35.8 % — ABNORMAL LOW (ref 36.0–46.0)
Hemoglobin: 11.9 g/dL — ABNORMAL LOW (ref 12.0–15.0)
MCHC: 33.2 g/dL (ref 30.0–36.0)
MCV: 91.9 fL (ref 78.0–100.0)
RBC: 3.9 MIL/uL (ref 3.87–5.11)
WBC: 9.9 10*3/uL (ref 4.0–10.5)

## 2010-06-28 LAB — SEDIMENTATION RATE: Sed Rate: 31 mm/hr — ABNORMAL HIGH (ref 0–22)

## 2010-06-29 LAB — DIFFERENTIAL
Basophils Absolute: 0 10*3/uL (ref 0.0–0.1)
Eosinophils Relative: 0 % (ref 0–5)
Lymphocytes Relative: 7 % — ABNORMAL LOW (ref 12–46)
Lymphs Abs: 0.8 10*3/uL (ref 0.7–4.0)
Neutro Abs: 9.2 10*3/uL — ABNORMAL HIGH (ref 1.7–7.7)
Neutrophils Relative %: 88 % — ABNORMAL HIGH (ref 43–77)

## 2010-06-29 LAB — BASIC METABOLIC PANEL
BUN: 37 mg/dL — ABNORMAL HIGH (ref 6–23)
Calcium: 8.9 mg/dL (ref 8.4–10.5)
Creatinine, Ser: 1.59 mg/dL — ABNORMAL HIGH (ref 0.4–1.2)
GFR calc non Af Amer: 31 mL/min — ABNORMAL LOW (ref >60)
Glucose, Bld: 158 mg/dL — ABNORMAL HIGH (ref 70–99)

## 2010-06-29 LAB — CBC
Platelets: 162 10*3/uL (ref 150–400)
RDW: 15.5 % (ref 11.5–15.5)
WBC: 10.4 10*3/uL (ref 4.0–10.5)

## 2010-06-29 LAB — CK: Total CK: 17 U/L (ref 7–177)

## 2010-07-27 NOTE — Group Therapy Note (Signed)
[name], VANDENBOSCH            ACCOUNT NO.:  [id number]   MEDICAL RECORD NO.:  [id number]          PATIENT TYPE:  INP   LOCATION:  A322                          FACILITY:  [facility]   PHYSICIAN:  Angus G. Everette Rank, MD   DATE OF BIRTH:  27-Jan-1929   DATE OF PROCEDURE:  DATE OF DISCHARGE:                                 PROGRESS NOTE   This patient was admitted with pain in her joints, hands, shoulders, and  back.  She does have a history of rheumatoid arthritis, degenerative  disk disease, and possible fibromyalgia.  She states she did not rest  through the night, but continues to have some pain in her fingers, her  neck, and back.  She has been started on prednisone 60 mg daily.   OBJECTIVE:  VITAL SIGNS:  Blood blood pressure 127/66, respirations 18,  pulse 66, and temperature 97.4.  LUNGS:  Clear to P and A.  HEART:  Regular rhythm.  ABDOMEN:  No palpable organs or masses.  MUSCULOSKELETAL:  The patient does have tenderness and enlargement of  muscle groups with thighs, legs, joint tenderness, and hands.   LABORATORY DATA:  BUN 37, creatinine 1.59, and glucose 158.   ASSESSMENT:  The patient was admitted what was thought to be myalgia.  She does have a history of rheumatoid arthritis, recent oral  candidiasis, gastroesophageal reflux disease, insomnia, and depression.   PLAN:  To continue with physical therapy.  Continue same dose of  prednisone.  Continue her other meds.  We will obtain a CPK to rule out  possibility of side effects from Crestor.      Angus G. Everette Rank, MD  Electronically Signed     AGM/MEDQ  D:  04/14/2008  T:  04/14/2008  Job:  [id number]

## 2010-07-27 NOTE — Group Therapy Note (Signed)
[name], [name]            ACCOUNT NO.:  [id number]   MEDICAL RECORD NO.:  [id number]          PATIENT TYPE:  INP   LOCATION:  A322                          FACILITY:  [facility]   PHYSICIAN:  Angus G. Everette Rank, MD   DATE OF BIRTH:  1929/03/03   DATE OF PROCEDURE:  DATE OF DISCHARGE:                                 PROGRESS NOTE   This patient was admitted with generalized pain.  She does have  rheumatoid arthritis, fibromyalgia, and osteoporosis.  She does have a  history of hypertension and dyslipidemia.   OBJECTIVE:  VITAL SIGNS:  Blood pressure 127/66, respiration 18, pulse  66, and temperature 97.4.  LUNGS:  Diminished breath sounds bilaterally.  HEART:  Regular rhythm.  ABDOMEN:  No palpable organs or masses.   She does have slight elevation of glucose of 158, BUN of 37, and  creatinine 1.59.   ASSESSMENT:  The patient does have history of rheumatoid arthritis and  was admitted with fibromyalgia like syndrome.   PLAN:  To continue current regimen.      Angus G. Everette Rank, MD  Electronically Signed     AGM/MEDQ  D:  04/14/2008  T:  04/14/2008  Job:  [id number]

## 2010-07-27 NOTE — Letter (Signed)
October 06, 2009   Delphina Cahill, [place],  [place] [address]   Re:  [name], ROSENKRANTZ                  DOB:  [birth year]   Dear Thedore Mins,   I saw the patient back in the office today and repeated her 32-month CT  scan and the right upper lobe lesion that was mildly metabolic on PET  scan has markedly resolved.  It has at least 80-90% resolved, so this is  probably an inflammatory process.  Her blood pressure was 126/74, pulse  76, respirations 18, and sats were 92%.  I plan to see her back again in  4 months with a chest x-ray for final check.   Nicanor Alcon, M.D.  Electronically Signed   DPB/MEDQ  D:  10/06/2009  T:  10/07/2009  Job:  [id number]

## 2010-07-27 NOTE — Group Therapy Note (Signed)
[name], TALLMADGE            ACCOUNT NO.:  [id number]   MEDICAL RECORD NO.:  [id number]          PATIENT TYPE:  INP   LOCATION:  A322                          FACILITY:  [facility]   PHYSICIAN:  Angus G. McInnis, MD   DATE OF BIRTH:  1928-06-08   DATE OF PROCEDURE:  DATE OF DISCHARGE:                                 PROGRESS NOTE   This patient was admitted for pain control, had pain over most of the  body, mainly in her back, shoulders, does have rheumatoid arthritis,  remains on low-dose prednisone, had recent oral candidiasis.  Actually,  she is feeling some better.   OBJECTIVE:  VITAL SIGNS:  Blood pressure 127/74, respirations 18, pulse  69, temperature 97.6.  LUNGS:  Clear to P&A.  HEART:  Regular rhythm.  ABDOMEN:  No palpable organs or masses.  MUSCULOSKELETAL:  The patient has some tenderness over the hands and  muscle pain.   ASSESSMENT:  The patient does have a history of rheumatoid arthritis,  degenerative disk disease, osteoporosis, recent oral candidiasis.   PLAN:  Continue current regimen.      Angus G. Everette Rank, MD  Electronically Signed     AGM/MEDQ  D:  04/15/2008  T:  04/15/2008  Job:  [id number]

## 2010-07-27 NOTE — H&P (Signed)
[name], [name]            ACCOUNT NO.:  [id number]   MEDICAL RECORD NO.:  [id number]          PATIENT TYPE:  INP   LOCATION:  [id number]                          FACILITY:  [facility]   PHYSICIAN:  Delphina Cahill, M.D.        DATE OF BIRTH:  [birth year]   DATE OF ADMISSION:  04/12/2008  DATE OF DISCHARGE:  LH                              HISTORY & PHYSICAL   PRIMARY DOCTOR:  Angus G. Everette Rank, MD   RHEUMATOLOGIST:  Michael Litter, MD   ENT:  Onnie Graham, MD   CHIEF COMPLAINT:  Pain all over and difficulty with ambulation secondary  to pain.   HISTORY OF PRESENT ILLNESS:  Ms. Scordato is a pleasant 77 year old  white female who has had some chronic health issues including rheumatoid  arthritis, but states she is having muscle aches and cramping for  sometime.  This worsened over the last 2-3 days.  No acute change in her  medications, but her daughter states there have difficulty getting her  up out of bed due to pain in her legs and arms.  She has had a little  bit of swelling as well, but biggest issue was this pain.  No specific  joint pain noted per her report.  She denies any chest pains.  No  problems with shortness of breath.  No problems with bowel or bladder.  Apparently, she has recently seen Dr. Justine Null on January 14 and continues  the same low-dose of prednisone which is much lower than previous what  she was on.  Nothing has made this better or worse over the last several  days.  Her daughter was concerned about her not being able to get up and  was brought into the emergency department for further assessment.   PAST MEDICAL HISTORY:  1. Significant for rheumatoid arthritis.  2. Degenerative disk disease.  3. Osteoporosis.  4. Hoarseness felt secondary to recent diagnosis of oral candidiasis.  5. Anemia, unknown disease.  6. COPD.  7. Hypertension.  8. Hyperlipidemia.  9. Gastroesophageal reflux disease.  10.Insomnia.  11.Anxiety depression.   MEDICATIONS:  1. Lexapro  10 mg once daily.  2. Triamterene and hydrochlorothiazide 37.5/25 once daily.  3. Benicar HCT 20 mg/12.5 once daily.  4. Crestor 20 mg once daily.  5. Protonix 40 mg once daily.  6. Ambien 10 mg at night.  7. Hydrocodone and acetaminophen 5/500 1 tablet b.i.d. p.r.n.  8. Ativan 0.5 mg q.6 h p.r.n. for anxiety.  9. Advair 250/50 b.i.d.  10.Fluconazole 100 mg once daily.  11.ProAir HFA q.6 h p.r.n. for shortness of breath, wheezing.  12.Aspirin 81 mg once daily.   ALLERGIES:  To Lipitor, nystatin, and Niaspan.  Lipitor caused muscle  pains, Niaspan caused flushing.   PAST SURGICAL HISTORY:  1. History of partial hysterectomy.  2. Gangrene of stomach, status post surgery in 1983.  3. Colonoscopy done in 2003 with polyp removal.   FAMILY HISTORY:  Mother died at 44 of kidney cancer, father died at 49  of MI, 1 brother died of cancer unknown type, another brother died of  pneumonia related to leukemia.   SOCIAL HISTORY:  She is widowed as of June, lives by herself, quit  smoking in 1982.  No alcohol or other drug use.  Has 4 living children  and 2 deceased, used a walker to get around with.   REVIEW OF SYSTEMS:  The patient states while she is lying there now, she  is not having any aches and pains.  Denies any chest pain.  No problems  with breathing.  No other neurologic complaints.  Does have drooling of  her right foot occasionally has a cramping and states that she does have  cramping in her legs and arms occasionally.   PHYSICAL EXAM:  VITAL SIGNS:  Temperature is 98.7, blood pressure  110/66, pulse 65, respirations 18, sating 95% on room air.  GENERAL:  This is a obese white female lying in bed in no acute  distress.  HEENT:  Unremarkable.  No JVD.  No thyromegaly.  LUNGS:  Good.  Clear to auscultation.  No rhonchi or wheezing.  HEART:  Regular rate and rhythm.  ABDOMEN:  Protuberant, soft, nondistended, positive bowel sounds.  EXTREMITIES:  Pain to palpation in the large  muscle groups both in her  thigh and her leg as well as her upper arms and lower arms.  No specific  joint pain.  No effusions noted in joint, 2+ pulses in all extremities.  NEUROLOGIC:  The patient is alert, oriented x3.  No deficits noted.  Does have generalized weakness secondary to her pain.   LABORATORY DATA:  CBC shows white count 9.9, hemoglobin 11.9, platelet  count 154.  BMET shows sodium 134, potassium 4.0, chloride 98, CO2 25,  glucose 168, BUN 29, creatinine of 1.69, bili of 1.0, alk phos 46, SGOT  19, SGPT 15, total protein of 5.9, albumin of 3.2, calcium of 9.2, sed  rate mildly elevated at 31.  UA was negative.   IMPRESSION:  This is a 77 year old obese white female with multiple pain  in her arms, legs limiting her movement.   ASSESSMENT/PLAN:  1. Myalgia.  It appears to be this as more of the case than arthralgia      which was previously stated.  She has muscle pain to palpation.      Question whether it is related to Crestor or question of mildly low      potassium just intramuscularly, although her lab work shows that it      is normal.  We will increase her prednisone for inflammation      acutely given that the mildly elevated sed rate it seems to be this      is more related to fibromyalgia-type picture around than rheumatoid      arthritis, although rheumatoid arthritis can cause this, and she      does have acute nodularities in her muscle groups.  It does not      appear that be that is the main cause.   Rheumatoid arthritis as mentioned above and feel this is stable, has  recently seen Dr. Justine Null and not feel this is a major issue causing her  problem.   Oral candidiasis.  She apparently just saw the ENT doctor looked at her  vocal cord and noted to have significant amounts of patches of white  yeast and will start her on Diflucan at that time, but continued to have  some of the hoarseness for now.  Unclear exactly how long she needs to  be treated with  the  Diflucan per Dr. Redmond Baseman' orders.   Hypertension.  She had had mildly low blood pressure when she came to  the floor, but we will hold her triamterene and hydrochlorothiazide  since she is also on Benicar HCT as well.   Gastroesophageal reflux disease.  We will continue on the Protonix.   Insomnia.  Will continue on Ambien   Anxiety, depression.  Will continue on Lexapro and p.r.n. Ativan.  This  may be contributing to some of her issues well may need to be up  titrated on this dose.   COPD.  Appears to be under control.  Continue on the Provera and Advair  Diskus as needed.   DISPOSITION:  The patient will be admitted and get further lab test.  She does have some renal insufficiency and started on some fluids.  Question whether or not having great p.o. intake at home or more  diminished secondary to her pain.  Likely, we will need to get physical  therapy both either in home or in the hospital to work with the patient.  Unclear exactly what was causing this muscle pains, but was severely  debilitated on admission and affect her quality of life.      Delphina Cahill, M.D.  Electronically Signed     ZH/MEDQ  D:  04/13/2008  T:  04/14/2008  Job:  [id number]

## 2010-07-27 NOTE — Letter (Signed)
February 16, 2010   Delphina Cahill, MD  [address] [place],  [place] [address]   Re:  [name], VOSBURGH                  DOB:  12/29/1928   Dear [name]:   I saw the patient back in the office today.  Medically, she has been  stable according to her and her family.  Her blood pressure was 115/79,  pulse 89, respirations 18, and sats were 95%.  Chest x-ray showed  resolution of the peripheral opacity, so I think this was inflammatory  and there is no reason for [place] to see her again anymore.  I will see her  back again if any other lung problems developed.   Nicanor Alcon, M.D.  Electronically Signed   DPB/MEDQ  D:  02/16/2010  T:  02/17/2010  Job:  [id number]

## 2010-07-27 NOTE — Letter (Signed)
Aug 05, 2009   Delphina Cahill, MD  [address] [place],  [place] [address]   Re:  SAINT, GOTTFRIED                  DOB:  June 28, 1928   Dear Thedore Mins,   I saw the patient today, I appreciate the referral.   This is a 77 year old Caucasian female who is having multiple medical  problems including rheumatoid arthritis, fibromyalgia, insomnia,  depression, anxiety, multiple skin cancers, chronic obstructive  pulmonary disease.  She was recently found to have a right upper lobe  lesion that was somewhat spiculated and was thought to be a possible non-  small cell lung cancer.  PET scan was done that showed just mild  metabolic activity and thought this may be an infectious process.  He is  referred here for evaluation.  She has quit smoking in 1982.  She has  had no hemoptysis, fever, chills or excessive sputum.  In September  2010, she was admitted for sepsis and pneumonia and she also has been  treated for hoarseness by Dr. Melida Quitter with some type of fungal  infection.   MEDICATION:  Cymbalta 60 mg in the morning, Benicar 40/25 once a day,  Protonix 40 mg twice a day, lorazepam 0.5 twice a day and p.r.n.  hydrocodone 7.5/5 one every 4 hours, Klonopin 300 mg twice a day,  methotrexate 12.5 mg once a week, she was also on prednisone but that  has been stopped, Crestor 20 mg a day, trazodone 100 mg at bedtime.  She  takes Advair and ProAir.  She recently had a fall in April and according  to the family has been much more debilitated since the fall with in  times of possible confusion.  She also has acid reflux,  hypercholesterolemia, anemia, cataracts and hypertension.   FAMILY HISTORY:  Noncontributory.   SOCIAL HISTORY:  She is a widow.  Does not drink alcohol.   ALLERGIES:  Niaspan and Lipitor cause myalgias.   REVIEW OF SYSTEMS:  CONSTITUTIONAL:  She is 212,  5 foot 2 inches.  GENERAL:  Weights has been stable.  She has shortness of breath with  exertion, lying flat.  CARDIAC:   No angina.  PULMONARY:  She has asthma and wheezing.  GI:  She has peptic ulcer disease reflux, hiatal hernia, abdominal pain,  constipation.  GU:  No kidney disease, dysuria or frequent urination.  VASCULAR:  She has got pain in her legs with walking and she has no DVT  or TIAs.  NEUROLOGIC:  He has dizziness and headaches.  MUSCULOSKELETAL:  Arthritis and joint pain.  PSYCHIATRIC:  Depression or nervous.  EYE/ENT:  No change in eyesight and hearing.  She has had decreased  hearing.  HEMATOLOGIC:  She has anemia.  No problems with bleeding or clotting  disorders.   PHYSICAL EXAMINATION:  General:  She is a ill-appearing Caucasian female  that is in a wheelchair.  Vital Signs:  Her blood pressure is 112/65,  pulse 94, respirations 18, sats were 90%.  HEENT:  Head is atraumatic.  Ears with decreased hearing.  Nose, there is no septal deviation.  Throat, uvula is in the midline in the face and at the right eye, there  is a what said to be a skin cancer and another area of skin cancer above  her left lip.  Chest:  Clear to auscultation and percussion.  Heart:  Regular sinus rhythm.  Abdomen:  Soft.  There is no hepatosplenomegaly.  Extremities:  Pulses 2+.  There is 1+ edema.  No clubbing.  I think Ms.  Baskette has multiple medical problems, problems as far as the chest  lesion since he has only borderline uptake on her PET.  I would just  think we can follow this with a CT scan in 2 months and I will see her  back at that time.  I am somewhat concerned because of being on  methotrexate and makes her acceptable some types of fungal infections,  and this does not improve in 2 months.  Then, I would definitely  recommend doing either a bronchoscopy and or needle biopsy.   I appreciate the opportunity of seeing the patient.    Sincerely,   Nicanor Alcon, M.D.  Electronically Signed   DPB/MEDQ  D:  08/05/2009  T:  08/06/2009  Job:  [id number]

## 2010-07-30 NOTE — Op Note (Signed)
[facility]  Patient:    [name], [name] Visit Number: [id number] MRN: [id number]          Service Type: END Location: DAY Attending Physician:  Bridgette Habermann Dictated by:   Garfield Cornea, M.D. Proc. Date: 04/17/01 Admit Date:  04/17/2001   CC:         Marjean Donna, M.D.   Operative Report  PROCEDURE:  Colonoscopy and snare polypectomy.  INDICATIONS FOR PROCEDURE:  The patient is a 77 year old lady with occasional hematochezia.  Colonoscopy is now being done to further evaluate rectal bleeding.  This approach has been discussed with [name].  The potential risks, benefits and alternatives have been reviewed and all questions answered.  She is agreeable.  Please see documentation in the medical record for more information.  DESCRIPTION OF PROCEDURE:  Oxygen saturation, blood pressure, pulse and respirations were monitored throughout the entire procedure.  Conscious sedation:  Versed 3 mg IV in divided doses, Demerol 50 mg IV in divided doses.  Cetacaine spray for topical oropharyngeal anesthesia.  Instrument:  Olympus videochip colonoscope.  FINDINGS:  Digital rectal examination revealed no abnormalities.  ENDOSCOPIC FINDINGS:  Prep was good.  RECTAL:  Examination of the rectal mucosa including retroflexed view  of the anal verge revealed internal hemorrhoids only.  COLON:  The colonic mucosa was surveyed from the rectosigmoid junction through the left, transverse and right colon to the appendiceal orifice, ileocecal valve and cecum.  These structures were well seen and photographed for the record.  The patient was noted to have two 5 mm polyps on stalks in the mid descending and sigmoid which were removed with snare cautery and recovered. The only other abnormalities again were pancolonic diverticula.  From the level of the cecum and ileocecal valve, the scope was slowly and cautiously withdrawn and all previously mentioned  mucosal surfaces were again seen.  No other abnormalities were observed.  The patient tolerated the procedure well and was reactive to endoscopy.  IMPRESSION:  1. Internal hemorrhoids; otherwise normal rectum. 2. Pancolonic diverticula. 3. Polyps and stalks in the sigmoid and descending colon were removed with    snare cautery.  The remainder of the colonic mucosa appeared normal.  I suspect the patient has had trivial bleeding from hemorrhoids.  RECOMMENDATIONS: 1. No aspirin or arthritis medications for 10 days. 2. Diverticulosis literature. 3. Daily Metamucil or Citrucel fiber supplement. 4. Anusol HC suppositories one per rectum at bedtime x 10 days. 5. Follow up on pathology. 6. Further recommendations to follow.  RECOMMENDATIONS: Dictated by:   Garfield Cornea, M.D. Attending Physician:  Bridgette Habermann DD:  04/17/01 TD:  04/17/01 Job: [id number]

## 2010-07-30 NOTE — Discharge Summary (Signed)
[name], [name]            ACCOUNT NO.:  [id number]   MEDICAL RECORD NO.:  [id number]          PATIENT TYPE:  INP   LOCATION:  A322                          FACILITY:  [facility]   PHYSICIAN:  Angus G. Everette Rank, MD   DATE OF BIRTH:  05-23-1928   DATE OF ADMISSION:  04/12/2008  DATE OF DISCHARGE:  02/02/2010LH                               DISCHARGE SUMMARY   DIAGNOSES:  1. Fibromyalgia.  2. Rheumatoid arthritis.  3. Oral candidiasis.  4. Hypertension.  5. Gastroesophageal reflux disease.  6. Insomnia.  7. Depression.   CONDITION:  Stable and improved at the time of her discharge.   This 77 year old white female has had chronic rheumatoid arthritis,  began having muscle aches, which had worsened 2-3 days prior to  admission.  She is having difficulty getting up out of bed because of  pain in her legs and arms.  She had recently seen Dr. Justine Null on March 27, 2008, and had remained on low-dose prednisone.   PHYSICAL EXAMINATION:  GENERAL:  On admission, obese white female.  VITAL SIGNS:  Blood pressure 110/66, respiration 18, pulse 65.  HEENT:  Eyes PERRLA.  TMs negative.  Oropharynx benign.  LUNGS:  Clear to P&A.  HEART:  Regular rhythm, no murmurs.  ABDOMEN:  No palpable organs or masses.  EXTREMITIES:  Pain to palpation in large muscle groups over the thigh  and leg and in the upper extremities.  NEUROLOGIC:  No focal deficit.   LABORATORY DATA:  Admission CBC, WBC 9900, hemoglobin 11.9, hematocrit  35.8.  Subsequent CBC on April 14, 2008, WBC 10,400 with hemoglobin  10.8, and hematocrit 31.7.  Chemistries, sodium 134, potassium 4,  chloride 98, CO2 of 25, glucose 168, BUN 29, creatinine 1.69.  Subsequent chemistries on April 14, 2008, sodium 135, potassium 4.6,  chloride 103, CO2 of 25, glucose 158, BUN 37, creatinine 1.59, GFR 31.  Liver enzymes, SGOT 19, SGPT 15, alkaline phosphatase 46, bilirubin 1,  CK 17.  Urinalysis negative.   HOSPITAL COURSE:  The patient  at the time of admission was started on  low-sodium diet, normal saline at 75 mL/hour.  She was continued on  Lexapro 10 mg daily, triamterene/hydrochlorothiazide 37.5/25 mg daily,  Benicar HCT 20/12.5 mg daily, Crestor 20 mg daily, pantoprazole 40 mg  daily, Ambien 10 mg at bedtime, hydrocodone/acetaminophen 5/500 every 6  hours p.r.n. for pain, lorazepam 0.5 mg every 6 hours p.r.n. for  anxiety, albuterol inhaler 2 puffs q.6 h. p.r.n. for shortness of  breath, Advair Diskus 250/50 one b.i.d., fluconazole 100 mg daily,  aspirin 81 mg daily, prednisone 60 mg p.o. daily.  The patient did have  CK done, which was negative.  The patient symptomatically improved  during the hospital stay.  She did have some evidence of renal  insufficiency and was started on intravenous fluids, this was monitored.  Creatinine came down to 1.59 and BUN 37.  It was felt she had a known  rheumatoid arthritis and degenerative disk disease, possible  fibromyalgia, the pain lessened gradually and she was able to be  ambulated with help, states she remained  in the hospital for 3 days  before discharge.   She was discharged on the following medications,  1. Lexapro 10 mg daily.  2. Benicar HCT 20/12.5 mg daily.  3. Crestor 20 mg daily.  4. Protonix 40 mg daily.  5. Ambien 10 mg at bedtime p.r.n.  6. Ativan 0.5 mg as needed.  7. Advair Diskus 250/50 b.i.d.  8. ProAir HFA every 4 hours as needed.  9. Aspirin 81 mg daily.  10.Duke's mixture swish a tablespoon p.o. t.i.d.  11.Prednisone 10 mg daily.  12.Vicodin ES 1 every 4 hours p.r.n. for pain.   The patient was asked to return to the office for followup.  The  patient's condition was stable at the time of discharge.      Angus G. Everette Rank, MD  Electronically Signed     AGM/MEDQ  D:  04/24/2008  T:  04/24/2008  Job:  [id number]

## 2010-07-30 NOTE — Procedures (Signed)
[name], [name]            ACCOUNT NO.:  [id number]   MEDICAL RECORD NO.:  [id number]          PATIENT TYPE:  OUT   LOCATION:  RAD                           FACILITY:  [facility]   PHYSICIAN:  Jacqulyn Ducking, M.D.  DATE OF BIRTH:  1928/12/25   DATE OF PROCEDURE:  DATE OF DISCHARGE:                                  ECHOCARDIOGRAM   REFERRING PHYSICIANS:  1.  Dr. Starr Sinclair. McInnis.  2.  Dr. Scarlett Presto.   CLINICAL DATA:  A 77 year old woman with dyspnea, palpitations, and  hypertension.   M-MODE:  1.  Aorta 2.5.  2.  Left atrium 4.5.  3.  Septum 1.4.  4.  Posterior wall 1.3.  5.  LV diastole 5.0.  6.  LV systole 2.5.   FINDINGS:  1.  Technically-adequate echocardiographic study.  2.  Mild left atrial enlargement; normal right atrial size.  3.  Normal right ventricular size and function; RVH present.  4.  Mild aortic valvular sclerosis with normal function.  5.  Normal tricuspid valve.  6.  Minimal mitral valve calcifications; mild-to-moderate annular      calcification; minimal mitral regurgitation.  7.  Normal IVC.  8.  Normal abdominal aorta.  9.  Normal internal dimension of the left ventricle; mild concentric LVH.      Normal regional and global LV systolic function.     Robe   RR/MEDQ  D:  12/11/2003  T:  12/11/2003  Job:  [id number]

## 2010-09-20 ENCOUNTER — Emergency Department (HOSPITAL_COMMUNITY): Payer: Medicare Other

## 2010-09-20 ENCOUNTER — Emergency Department (HOSPITAL_COMMUNITY)
Admission: EM | Admit: 2010-09-20 | Discharge: 2010-09-20 | Disposition: A | Discharge status: Home / Self Care | Payer: Medicare Other | Attending: Emergency Medicine | Admitting: Emergency Medicine

## 2010-09-20 ENCOUNTER — Encounter (HOSPITAL_COMMUNITY): Payer: Self-pay

## 2010-09-20 DIAGNOSIS — E7800 Pure hypercholesterolemia, unspecified: Secondary | ICD-10-CM | POA: Insufficient documentation

## 2010-09-20 DIAGNOSIS — K219 Gastro-esophageal reflux disease without esophagitis: Secondary | ICD-10-CM | POA: Insufficient documentation

## 2010-09-20 DIAGNOSIS — F411 Generalized anxiety disorder: Secondary | ICD-10-CM | POA: Insufficient documentation

## 2010-09-20 DIAGNOSIS — W010XXA Fall on same level from slipping, tripping and stumbling without subsequent striking against object, initial encounter: Secondary | ICD-10-CM | POA: Insufficient documentation

## 2010-09-20 DIAGNOSIS — Z79899 Other long term (current) drug therapy: Secondary | ICD-10-CM | POA: Insufficient documentation

## 2010-09-20 DIAGNOSIS — F329 Major depressive disorder, single episode, unspecified: Secondary | ICD-10-CM | POA: Insufficient documentation

## 2010-09-20 DIAGNOSIS — S82899A Other fracture of unspecified lower leg, initial encounter for closed fracture: Secondary | ICD-10-CM | POA: Insufficient documentation

## 2010-09-20 DIAGNOSIS — J4489 Other specified chronic obstructive pulmonary disease: Secondary | ICD-10-CM | POA: Insufficient documentation

## 2010-09-20 DIAGNOSIS — F3289 Other specified depressive episodes: Secondary | ICD-10-CM | POA: Insufficient documentation

## 2010-09-20 DIAGNOSIS — J449 Chronic obstructive pulmonary disease, unspecified: Secondary | ICD-10-CM | POA: Insufficient documentation

## 2010-09-20 DIAGNOSIS — G4700 Insomnia, unspecified: Secondary | ICD-10-CM | POA: Insufficient documentation

## 2010-09-20 DIAGNOSIS — I10 Essential (primary) hypertension: Secondary | ICD-10-CM | POA: Insufficient documentation

## 2010-09-20 DIAGNOSIS — M069 Rheumatoid arthritis, unspecified: Secondary | ICD-10-CM | POA: Insufficient documentation

## 2010-09-20 DIAGNOSIS — S82839A Other fracture of upper and lower end of unspecified fibula, initial encounter for closed fracture: Secondary | ICD-10-CM | POA: Diagnosis present

## 2010-09-20 HISTORY — DX: Dorsalgia, unspecified: M54.9

## 2010-09-20 HISTORY — DX: Depression, unspecified: F32.A

## 2010-09-20 HISTORY — DX: Major depressive disorder, single episode, unspecified: F32.9

## 2010-09-20 HISTORY — DX: Chronic obstructive pulmonary disease, unspecified: J44.9

## 2010-09-20 HISTORY — DX: Malignant (primary) neoplasm, unspecified: C80.1

## 2010-09-20 HISTORY — DX: Pure hypercholesterolemia, unspecified: E78.00

## 2010-09-20 HISTORY — DX: Essential (primary) hypertension: I10

## 2010-09-20 HISTORY — DX: Fibromyalgia: M79.7

## 2010-09-20 HISTORY — DX: Insomnia, unspecified: G47.00

## 2010-09-20 HISTORY — DX: Anemia, unspecified: D64.9

## 2010-09-20 HISTORY — DX: Rheumatoid vasculitis with rheumatoid arthritis of unspecified site: M05.20

## 2010-09-20 MED ORDER — OXYCODONE-ACETAMINOPHEN 5-325 MG PO TABS: 1.0000 | ORAL_TABLET | ORAL | Status: AC | PRN

## 2010-09-20 MED ORDER — OXYCODONE-ACETAMINOPHEN 5-325 MG PO TABS
1.0000 | ORAL_TABLET | Freq: Once | ORAL | Status: AC
  Administered 2010-09-20: 1 via ORAL
  Filled 2010-09-20 (×2): qty 1

## 2010-09-20 MED ORDER — ONDANSETRON 8 MG PO TBDP
8.0000 mg | ORAL_TABLET | Freq: Once | ORAL | Status: AC
  Administered 2010-09-20: 8 mg via ORAL
  Filled 2010-09-20 (×2): qty 1

## 2010-09-20 MED ORDER — OXYCODONE-ACETAMINOPHEN 5-325 MG PO TABS: 1.0000 | ORAL_TABLET | Freq: Once | ORAL | Status: DC

## 2010-09-20 NOTE — ED Provider Notes (Addendum)
History     Chief Complaint  Patient presents with  . Fall   HPI Comments: Pt fell yesterday while stepping out of the shower onto a wet floor; c/o increased pain, bruising, and swelling to left ankle as well as mild bruising and soreness to left knee. Admits to ha currently but states it is from not sleeping well last night.  Patient is a 77 y.o. female presenting with fall. The history is provided by the patient.  Fall The accident occurred yesterday. Incident: slipped while getting out of shower. Distance fallen: from standing. She landed on a hard floor. There was no blood loss. Pain location: left ankle. The pain is moderate. She was not ambulatory at the scene. Associated symptoms include headaches. Pertinent negatives include no visual change, no numbness, no abdominal pain, no nausea, no vomiting and no loss of consciousness. Associated symptoms comments: No head injury, neck pain, back pain, hip pain, dizziness or weakness.    Past Medical History  Diagnosis Date  . Acid reflux   . Anemia   . Anxiety   . Back pain   . COPD (chronic obstructive pulmonary disease)   . Depression   . Fibromyalgia   . Hypercholesteremia   . Hypertension   . Insomnia   . Rheumatoid arteritis   . Cancer     Past Surgical History  Procedure Date  . Abdominal surgery     History reviewed. No pertinent family history.  History  Substance Use Topics  . Smoking status: Never Smoker   . Smokeless tobacco: Not on file  . Alcohol Use: No    OB History    Grav Para Term Preterm Abortions TAB SAB Ect Mult Living                  Review of Systems  HENT: Negative for neck pain.   Gastrointestinal: Negative for nausea, vomiting and abdominal pain.  Musculoskeletal: Negative for back pain.  Skin: Negative for wound.  Neurological: Positive for headaches. Negative for loss of consciousness and numbness.  All other systems reviewed and are negative.    Physical Exam  BP 112/56  Pulse  66  Temp(Src) 98.4 F (36.9 C) (Oral)  Resp 16  Ht 5\' 2"  (1.575 m)  Wt 214 lb (97.07 kg)  BMI 39.14 kg/m2  SpO2 92%  Physical Exam CONSTITUTIONAL: Well developed/well nourished HEAD AND FACE: Normocephalic/atraumatic EYES: EOMI/PERRL ENMT: Mucous membranes moist NECK: supple no meningeal signs SPINE:entire spine nontender CV: S1/S2 noted, no murmurs/rubs/gallops noted LUNGS: Lungs are clear to auscultation bilaterally, no apparent distress ABDOMEN: soft, nontender, no rebound or guarding GU:no cva tenderness NEURO: Pt is awake/alert, moves all extremitiesx4 EXTREMITIES: pulses normal; NVI; bruising and tenderness to left patella; bruising, tenderness, and swelling to left ankle; no open skin; limited ROM left ankle d/t pain; FROM left knee and hip without pain. The right LE is unremarkable SKIN: warm, color normal PSYCH: no abnormalities of mood noted.  ED Course  Procedures Written by Caryl Bis acting as scribe for Dr. Christy Gentles.   MDM Nursing notes reviewed and considered in documentation xrays reviewed and considered D/w dr Luna Glasgow, splint and will see tomorrow  Pt stable at this time, no other injuries noted/reported She has walker at home, and will remain NWB until seen by Luna Glasgow D/w family Pt feels comfortable with plan  The splint was applied by nursing to left LE      Sharyon Cable, MD 09/20/10 1321  I personally performed the  services described in this documentation, which was scribed in my presence. The recorded information has been reviewed and considered.    Sharyon Cable, MD 10/14/10 [phone]

## 2010-09-20 NOTE — ED Notes (Signed)
Pt presents with left ankle/ leg swelling. Pt fell yesterday. Pt denies LOC.

## 2010-12-21 LAB — COMPREHENSIVE METABOLIC PANEL
ALT: 13
Albumin: 2.8 — ABNORMAL LOW
Alkaline Phosphatase: 67
Chloride: 106
Glucose, Bld: 142 — ABNORMAL HIGH
Potassium: 3.9
Sodium: 136
Total Bilirubin: 0.4
Total Protein: 6.3

## 2010-12-21 LAB — URINALYSIS, ROUTINE W REFLEX MICROSCOPIC
Nitrite: NEGATIVE
Protein, ur: NEGATIVE
Specific Gravity, Urine: 1.015
Urobilinogen, UA: 0.2

## 2010-12-21 LAB — DIFFERENTIAL
Basophils Absolute: 0
Basophils Relative: 0
Eosinophils Absolute: 0.1 — ABNORMAL LOW
Neutro Abs: 7.7
Neutrophils Relative %: 83 — ABNORMAL HIGH

## 2010-12-21 LAB — CBC
Hemoglobin: 10.6 — ABNORMAL LOW
RBC: 4.06
RDW: 14.3
WBC: 9.3

## 2010-12-31 ENCOUNTER — Other Ambulatory Visit (HOSPITAL_COMMUNITY): Payer: Self-pay | Admitting: Internal Medicine

## 2010-12-31 DIAGNOSIS — N6310 Unspecified lump in the right breast, unspecified quadrant: Secondary | ICD-10-CM

## 2011-01-05 ENCOUNTER — Other Ambulatory Visit (HOSPITAL_COMMUNITY): Payer: Self-pay | Admitting: Internal Medicine

## 2011-01-05 DIAGNOSIS — R1011 Right upper quadrant pain: Secondary | ICD-10-CM

## 2011-01-07 ENCOUNTER — Ambulatory Visit (HOSPITAL_COMMUNITY)
Admission: RE | Admit: 2011-01-07 | Discharge: 2011-01-07 | Disposition: A | Discharge status: Home / Self Care | Payer: Medicare Other | Source: Ambulatory Visit | Attending: Internal Medicine | Admitting: Internal Medicine

## 2011-01-07 DIAGNOSIS — D7389 Other diseases of spleen: Secondary | ICD-10-CM | POA: Insufficient documentation

## 2011-01-07 DIAGNOSIS — R9389 Abnormal findings on diagnostic imaging of other specified body structures: Secondary | ICD-10-CM | POA: Insufficient documentation

## 2011-01-07 DIAGNOSIS — Z9884 Bariatric surgery status: Secondary | ICD-10-CM | POA: Insufficient documentation

## 2011-01-07 DIAGNOSIS — R933 Abnormal findings on diagnostic imaging of other parts of digestive tract: Secondary | ICD-10-CM | POA: Insufficient documentation

## 2011-01-07 DIAGNOSIS — R1011 Right upper quadrant pain: Secondary | ICD-10-CM

## 2011-01-07 DIAGNOSIS — R1012 Left upper quadrant pain: Secondary | ICD-10-CM | POA: Insufficient documentation

## 2011-01-07 MED ORDER — IOHEXOL 300 MG/ML  SOLN
100.0000 mL | Freq: Once | INTRAMUSCULAR | Status: AC | PRN
  Administered 2011-01-07: 100 mL via INTRAVENOUS

## 2011-01-12 ENCOUNTER — Ambulatory Visit (HOSPITAL_COMMUNITY)
Admission: RE | Admit: 2011-01-12 | Discharge: 2011-01-12 | Disposition: A | Discharge status: Home / Self Care | Payer: Medicare Other | Source: Ambulatory Visit | Attending: Internal Medicine | Admitting: Internal Medicine

## 2011-01-12 DIAGNOSIS — N6310 Unspecified lump in the right breast, unspecified quadrant: Secondary | ICD-10-CM

## 2011-01-12 DIAGNOSIS — N63 Unspecified lump in unspecified breast: Secondary | ICD-10-CM | POA: Insufficient documentation

## 2011-05-18 ENCOUNTER — Ambulatory Visit (HOSPITAL_COMMUNITY)
Admission: RE | Admit: 2011-05-18 | Discharge: 2011-05-18 | Disposition: A | Discharge status: Home / Self Care | Payer: Medicare Other | Source: Ambulatory Visit | Attending: Internal Medicine | Admitting: Internal Medicine

## 2011-05-18 ENCOUNTER — Other Ambulatory Visit (HOSPITAL_COMMUNITY): Payer: Self-pay | Admitting: Internal Medicine

## 2011-05-18 DIAGNOSIS — R05 Cough: Secondary | ICD-10-CM | POA: Insufficient documentation

## 2011-05-18 DIAGNOSIS — R0989 Other specified symptoms and signs involving the circulatory and respiratory systems: Secondary | ICD-10-CM | POA: Insufficient documentation

## 2011-05-18 DIAGNOSIS — IMO0001 Reserved for inherently not codable concepts without codable children: Secondary | ICD-10-CM

## 2011-05-18 DIAGNOSIS — R059 Cough, unspecified: Secondary | ICD-10-CM

## 2011-08-11 ENCOUNTER — Encounter: Payer: Self-pay | Admitting: Internal Medicine

## 2011-09-08 ENCOUNTER — Encounter: Payer: Self-pay | Admitting: Internal Medicine

## 2011-09-08 ENCOUNTER — Ambulatory Visit (AMBULATORY_SURGERY_CENTER): Payer: Medicare Other | Admitting: Internal Medicine

## 2011-09-08 ENCOUNTER — Ambulatory Visit (INDEPENDENT_AMBULATORY_CARE_PROVIDER_SITE_OTHER): Payer: Medicare Other | Admitting: Internal Medicine

## 2011-09-08 VITALS — BP 146/106 | HR 90 | Temp 99.4°F | Resp 25 | Ht 60.0 in | Wt 215.0 lb

## 2011-09-08 VITALS — BP 134/80 | HR 64 | Ht 60.0 in | Wt 215.0 lb

## 2011-09-08 DIAGNOSIS — R109 Unspecified abdominal pain: Secondary | ICD-10-CM

## 2011-09-08 DIAGNOSIS — R1010 Upper abdominal pain, unspecified: Secondary | ICD-10-CM

## 2011-09-08 DIAGNOSIS — K319 Disease of stomach and duodenum, unspecified: Secondary | ICD-10-CM

## 2011-09-08 DIAGNOSIS — R933 Abnormal findings on diagnostic imaging of other parts of digestive tract: Secondary | ICD-10-CM

## 2011-09-08 DIAGNOSIS — K317 Polyp of stomach and duodenum: Secondary | ICD-10-CM

## 2011-09-08 HISTORY — DX: Polyp of stomach and duodenum: K31.7

## 2011-09-08 MED ORDER — SODIUM CHLORIDE 0.9 % IV SOLN: 500.0000 mL | INTRAVENOUS | Status: DC

## 2011-09-08 NOTE — Patient Instructions (Addendum)
YOU HAD AN ENDOSCOPIC PROCEDURE TODAY AT THE [facility]: Refer to the procedure report that was given to you for any specific questions about what was found during the examination.  If the procedure report does not answer your questions, please call your gastroenterologist to clarify.  If you requested that your care partner not be given the details of your procedure findings, then the procedure report has been included in a sealed envelope for you to review at your convenience later.  YOU SHOULD EXPECT: Some feelings of bloating in the abdomen. Passage of more gas than usual.  Walking can help get rid of the air that was put into your GI tract during the procedure and reduce the bloating. If you had a lower endoscopy (such as a colonoscopy or flexible sigmoidoscopy) you may notice spotting of blood in your stool or on the toilet paper. If you underwent a bowel prep for your procedure, then you may not have a normal bowel movement for a few days.  DIET: Your first meal following the procedure should be a light meal and then it is ok to progress to your normal diet.  A half-sandwich or bowl of soup is an example of a good first meal.  Heavy or fried foods are harder to digest and may make you feel nauseous or bloated.  Likewise meals heavy in dairy and vegetables can cause extra gas to form and this can also increase the bloating.  Drink plenty of fluids but you should avoid alcoholic beverages for 24 hours.  ACTIVITY: Your care partner should take you home directly after the procedure.  You should plan to take it easy, moving slowly for the rest of the day.  You can resume normal activity the day after the procedure however you should NOT DRIVE or use heavy machinery for 24 hours (because of the sedation medicines used during the test).    SYMPTOMS TO REPORT IMMEDIATELY: A gastroenterologist can be reached at any hour.  During normal business hours, 8:30 AM to 5:00 PM Monday through Friday,  call [phone].  After hours and on weekends, please call the GI answering service at [phone] who will take a message and have the physician on call contact you.   Following lower endoscopy (colonoscopy or flexible sigmoidoscopy):  Excessive amounts of blood in the stool  Significant tenderness or worsening of abdominal pains  Swelling of the abdomen that is new, acute  Fever of 100F or higher  Following upper endoscopy (EGD)  Vomiting of blood or coffee ground material  New chest pain or pain under the shoulder blades  Painful or persistently difficult swallowing  New shortness of breath  Fever of 100F or higher  Black, tarry-looking stools  FOLLOW UP: If any biopsies were taken you will be contacted by phone or by letter within the next 1-3 weeks.  Call your gastroenterologist if you have not heard about the biopsies in 3 weeks.  Our staff will call the home number listed on your records the next business day following your procedure to check on you and address any questions or concerns that you may have at that time regarding the information given to you following your procedure. This is a courtesy call and so if there is no answer at the home number and we have not heard from you through the emergency physician on call, we will assume that you have returned to your regular daily activities without incident.  SIGNATURES/CONFIDENTIALITY: You and/or your care   partner have signed paperwork which will be entered into your electronic medical record.  These signatures attest to the fact that that the information above on your After Visit Summary has been reviewed and is understood.  Full responsibility of the confidentiality of this discharge information lies with you and/or your care-partner.    Await biopsy results.

## 2011-09-08 NOTE — Patient Instructions (Addendum)
You have been scheduled for an endoscopy with propofol. Please follow written instructions given to you at your visit today.  

## 2011-09-08 NOTE — Progress Notes (Signed)
Subjective:    Patient ID: [name], female    DOB: Oct 05, 1928, 78 y.o.   MRN: [id number] Referred by: Wende Neighbors, MD  HPI This is a very pleasant elderly white woman here with her daughter. She's had months, going back to the fall of 2012, ALT operative nominal pain. It occurs in variable spots but it is upper. It mostly seems to start in the left upper quadrant radiating over to the epigastrium and into the right upper quadrant. It can be associated with movements. She has a lot of back pain as well. It does not seem to be associated with eating but she's not sure. Many years ago she had peptic ulcer surgery, and a recent CT scan in October 2012 demonstrated thickening of the anastomosis though they have thought she had bariatric surgery says sometimes it feels like a knot tear. Pressing and will help. It can be very sharp and disturbing or more of an ache or a grabbing pain. Her appetite is off somewhat. She has chronic constipation, she uses MiraLax and stool softeners but is not taking her MiraLax every day. She moves her bowels about every other day.  She also has some intermittent heartburn or reflux and belching.  Last colonoscopy in 2003 with 2 diminutive polyps. Subsequent to that her 66 year old daughter died of colon cancer. Allergies  Allergen Reactions  . Lipitor (Atorvastatin Calcium) Other (See Comments)  . Niaspan (Niacin Er) Other (See Comments)  . Other     Band Aids    Outpatient Prescriptions Prior to Visit  Medication Sig Dispense Refill  . Albuterol Sulfate (PROAIR HFA IN) Inhale into the lungs.      Marland Kitchen aspirin 81 MG tablet Take 81 mg by mouth daily.        . cyanocobalamin 100 MCG tablet Take 100 mcg by mouth daily.      . DULoxetine (CYMBALTA) 60 MG capsule Take 60 mg by mouth daily.        . ferrous gluconate (FERGON) 325 MG tablet Take 325 mg by mouth 2 (two) times daily.      . Fluticasone-Salmeterol (ADVAIR DISKUS) 250-50 MCG/DOSE AEPB Inhale 1 puff into  the lungs every 12 (twelve) hours.      . folic acid (FOLVITE) 1 MG tablet Take 1 mg by mouth daily.      Marland Kitchen HYDROcodone-acetaminophen (VICODIN) 5-500 MG per tablet Take 1 tablet by mouth every 6 (six) hours as needed.        Marland Kitchen LORazepam (ATIVAN) 0.5 MG tablet Take 0.5 mg by mouth every 8 (eight) hours.        . methotrexate (RHEUMATREX) 2.5 MG tablet Take 2.5 mg by mouth as directed. Caution:Chemotherapy. Protect from light.      . nebivolol (BYSTOLIC) 5 MG tablet Take 5 mg by mouth daily.        . pantoprazole (PROTONIX) 40 MG tablet Take 40 mg by mouth daily.        . predniSONE (DELTASONE) 5 MG tablet Take 5 mg by mouth daily.        . pregabalin (LYRICA) 50 MG capsule Take 50 mg by mouth 3 (three) times daily.        . Probiotic Product (PROBIOTIC FORMULA PO) Take by mouth.      . rosuvastatin (CRESTOR) 20 MG tablet Take 10 mg by mouth daily.       . vitamin C (ASCORBIC ACID) 500 MG tablet Take 500 mg by mouth daily.      Marland Kitchen  Cholecalciferol (VITAMIN D3) 1000 UNITS CHEW Chew by mouth.      Mariane Baumgarten Sodium (COLACE PO) Take by mouth.       Past Medical History  Diagnosis Date  . Anemia   . Back pain   . COPD (chronic obstructive pulmonary disease)   . Depression   . Fibromyalgia   . Hypercholesteremia   . Hypertension   . Insomnia   . Rheumatoid arteritis   . Cancer     Skin   . Vitamin d deficiency   . Hereditary peripheral neuropathy   . Generalized anxiety disorder   . GERD (gastroesophageal reflux disease)   . Bacterial pneumonia   . Fatty liver   . Hx of colonic polyps   . Internal hemorrhoids   . Diverticulosis    Past Surgical History  Procedure Date  . Abdominal surgery   . Abdominal hysterectomy   . Colonoscopy 04-2001    internal hemorrhoids, panocolonic diverticulosis, and colon polyps    History   Social History  . Marital Status: Widowed    Spouse Name: N/A    Number of Children: 6  . Years of Education: N/A   Occupational History  . Retired     Social History Main Topics  . Smoking status: Never Smoker   . Smokeless tobacco: Never Used  . Alcohol Use: No  . Drug Use: No  . Sexually Active: None   Other Topics Concern  . None   Social History Narrative   Daily caffeine    Family History  Problem Relation Age of Onset  . Colon cancer Daughter   . Heart disease Father   . Kidney disease Mother      Review of Systems This is positive for anxiety, arthritis and back pain, depression symptoms, fatigue, hearing difficulty, night sweats, some dyspnea, pedal edema and muscle pains or cramps. All other symptoms were systems are reviewed negative or as reflected in the history of present illness.    Objective:   Physical Exam General Elderly chronically ill, in no acute distress Eyes:  anicteric. ENT:   Mouth and posterior pharynx free of lesions. + dentures Neck:   supple w/o thyromegaly or mass.  Lungs: Clear to auscultation bilaterally. Heart:  S1S2, no rubs, murmurs, gallops. Abdomen:  soft, tender in LUQ and xiphoid (chestwall), no hepatosplenomegaly, hernia, or mass and BS+. The abdominal wall was not tender when tensed. Lymph:  no cervical or supraclavicular adenopathy. Extremities:   Trace edema Neuro:  A&O x 3.  Psych:  appropriate mood and  Affect.   Data Reviewed: Colonoscopy report from 2003. CT scan including imaging review from October 2012. Dr. Juel Burrow recent office notes.       Assessment & Plan:   1. Upper abdominal pain   2. Abnormal CT scan, stomach with thickened gastroenterostomy October 2012   The cause of her symptoms is not entirely clear. There are some features that suggest as a musculoskeletal component I believe that is so. She may have osteochondritis. Given the thickening seen on the CT scan and the location of her pain I think an upper endoscopy is prudent. I've explained the risks benefits and indications as well as alternatives to the patient and her daughter and they agreed to  proceed. This will be done this afternoon. Further plans pending that.  She also has a family history of colon cancer. However she is 20 and a little bit debilitated. Her constipation is chronic, there are no lower GI symptoms  that would necessitate a colonoscopy but we will keep this in mind as a possibility. Neither she nor her daughter were pushing for a colonoscopy at this time.   I appreciate the opportunity to care for this patient.  CC: Wende Neighbors, MD

## 2011-09-08 NOTE — Progress Notes (Signed)
Patient did not experience any of the following events: a burn prior to discharge; a fall within the facility; wrong site/side/patient/procedure/implant event; or a hospital transfer or hospital admission upon discharge from the facility. (G8907) Patient did not have preoperative order for IV antibiotic SSI prophylaxis. (G8918)  

## 2011-09-08 NOTE — Op Note (Signed)
[facility] [address]. [place], [place]  [id number]  ENDOSCOPY PROCEDURE REPORT  PATIENT:  Becky, Dunn  MR#:  [id number] BIRTHDATE:  08/30/1928, 83 yrs. old  GENDER:  female  ENDOSCOPIST:  Gatha Mayer, MD, [facility] Referred by:  Delphina Cahill, M.D.  PROCEDURE DATE:  09/08/2011 PROCEDURE:  EGD with biopsy, 43239 ASA CLASS:  Class III INDICATIONS:  abdominal pain, abnormal imaging upper abdominal pain, thickened gastroenteric anastomosis on CT  MEDICATIONS:   MAC sedation, administered by CRNA, glycopyrrolate (Robinal) 0.2 mg IV, propofol (Diprivan) 40 mg IV TOPICAL ANESTHETIC:  Cetacaine Spray  DESCRIPTION OF PROCEDURE:   After the risks benefits and alternatives of the procedure were thoroughly explained, informed consent was obtained.  The LB [id number] endoscope was introduced through the mouth and advanced to the proximal jejunum (both limbs of Bilroth II), without limitations.  The instrument was slowly withdrawn as the mucosa was fully examined. <<PROCEDUREIMAGES>>  A sessile polyp was found at the anastomosis. 2 cm fleshy polyp on small bowel side of gastroenteric anastomosis. Multiple biopsies were obtained and sent to pathology.  This was otherwise a normal post-op examination. s/p Bilripoth II gastroenterostomy. Retroflexed views revealed no abnormalities.    The scope was then withdrawn from the patient and the procedure completed.  COMPLICATIONS:  None  ENDOSCOPIC IMPRESSION: 1) Sessile polyp at the anastomosis - biopsied 2) Normal post-op examination otherwise (s/p Bilroth II) RECOMMENDATIONS: 1) Await biopsy results 2) Consider sucralfate therapy pending results 3) will likely need therapy for musculoskeletal pain also  Gatha Mayer, MD, [facility]  CC:  Wende Neighbors MD and The Patient  n. eSIGNED:   Gatha Mayer at 09/08/2011 02:38 PM  Anastasia Pall, [id number]

## 2011-09-08 NOTE — Progress Notes (Signed)
The pt tolerated the egd very well. Maw   

## 2011-09-09 ENCOUNTER — Telehealth: Payer: Self-pay | Admitting: *Deleted

## 2011-09-09 NOTE — Telephone Encounter (Signed)
  Follow up Call-  Call back number 09/08/2011  Post procedure Call Back phone  # [phone] or [phone] ask for [name]  Permission to leave phone message No     Patient questions:  Do you have a fever, pain , or abdominal swelling? no Pain Score  0 *  Have you tolerated food without any problems? yes  Have you been able to return to your normal activities? yes  Do you have any questions about your discharge instructions: Diet   no Medications  no Follow up visit  no  Do you have questions or concerns about your Care? no  Actions: * If pain score is 4 or above: No action needed, pain <4.  Information provided by  Daughter POA, daughter stated she was impressed by all the staff care that was given.

## 2011-09-14 ENCOUNTER — Other Ambulatory Visit: Payer: Self-pay

## 2011-09-14 MED ORDER — SUCRALFATE 1 G PO TABS: 1.0000 g | ORAL_TABLET | Freq: Four times a day (QID) | ORAL | Status: DC

## 2011-09-14 NOTE — Progress Notes (Signed)
Quick Note:  Air traffic controller and explain anastomotic polyp is inflammatory and likely not related to symptoms  I think she has some musculoskeletal problems - some stomach pain possible  Lets try carafate 1 gram four times a day #120 1 refill See me in 6 weeks approx Stay on Miralax 1-2 times a day  Also needs to follow-up with PCP/rheumatologst about musculoskeletal upper abd/chest wall and back pain - ? Other Tx?  [facility] - no recall or letter ______

## 2011-10-16 ENCOUNTER — Emergency Department (HOSPITAL_COMMUNITY): Payer: Medicare Other

## 2011-10-16 ENCOUNTER — Emergency Department (HOSPITAL_COMMUNITY)
Admission: EM | Admit: 2011-10-16 | Discharge: 2011-10-16 | Disposition: A | Discharge status: Home / Self Care | Payer: Medicare Other | Attending: Emergency Medicine | Admitting: Emergency Medicine

## 2011-10-16 ENCOUNTER — Encounter (HOSPITAL_COMMUNITY): Payer: Self-pay | Admitting: Emergency Medicine

## 2011-10-16 DIAGNOSIS — M069 Rheumatoid arthritis, unspecified: Secondary | ICD-10-CM | POA: Insufficient documentation

## 2011-10-16 DIAGNOSIS — F329 Major depressive disorder, single episode, unspecified: Secondary | ICD-10-CM | POA: Insufficient documentation

## 2011-10-16 DIAGNOSIS — Z79899 Other long term (current) drug therapy: Secondary | ICD-10-CM | POA: Insufficient documentation

## 2011-10-16 DIAGNOSIS — F3289 Other specified depressive episodes: Secondary | ICD-10-CM | POA: Insufficient documentation

## 2011-10-16 DIAGNOSIS — J4489 Other specified chronic obstructive pulmonary disease: Secondary | ICD-10-CM | POA: Insufficient documentation

## 2011-10-16 DIAGNOSIS — M79609 Pain in unspecified limb: Secondary | ICD-10-CM | POA: Insufficient documentation

## 2011-10-16 DIAGNOSIS — M25519 Pain in unspecified shoulder: Secondary | ICD-10-CM | POA: Insufficient documentation

## 2011-10-16 DIAGNOSIS — E7800 Pure hypercholesterolemia, unspecified: Secondary | ICD-10-CM | POA: Insufficient documentation

## 2011-10-16 DIAGNOSIS — IMO0001 Reserved for inherently not codable concepts without codable children: Secondary | ICD-10-CM | POA: Insufficient documentation

## 2011-10-16 DIAGNOSIS — R52 Pain, unspecified: Secondary | ICD-10-CM | POA: Insufficient documentation

## 2011-10-16 DIAGNOSIS — J449 Chronic obstructive pulmonary disease, unspecified: Secondary | ICD-10-CM | POA: Insufficient documentation

## 2011-10-16 DIAGNOSIS — R079 Chest pain, unspecified: Secondary | ICD-10-CM | POA: Insufficient documentation

## 2011-10-16 DIAGNOSIS — I10 Essential (primary) hypertension: Secondary | ICD-10-CM | POA: Insufficient documentation

## 2011-10-16 LAB — BASIC METABOLIC PANEL
Chloride: 100 mEq/L (ref 96–112)
GFR calc Af Amer: 50 mL/min — ABNORMAL LOW (ref >90)
GFR calc non Af Amer: 43 mL/min — ABNORMAL LOW (ref >90)
Potassium: 3.3 mEq/L — ABNORMAL LOW (ref 3.5–5.1)
Sodium: 137 mEq/L (ref 135–145)

## 2011-10-16 LAB — URINALYSIS, ROUTINE W REFLEX MICROSCOPIC
Hgb urine dipstick: NEGATIVE
Leukocytes, UA: NEGATIVE
Nitrite: NEGATIVE
Protein, ur: NEGATIVE mg/dL
Specific Gravity, Urine: 1.015 (ref 1.005–1.030)
Urobilinogen, UA: 0.2 mg/dL (ref 0.0–1.0)

## 2011-10-16 LAB — TROPONIN I: Troponin I: <0.3 ng/mL (ref <0.30)

## 2011-10-16 LAB — CBC
HCT: 37.7 % (ref 36.0–46.0)
MCHC: 31.8 g/dL (ref 30.0–36.0)
Platelets: 129 10*3/uL — ABNORMAL LOW (ref 150–400)
RDW: 13.8 % (ref 11.5–15.5)
WBC: 8.1 10*3/uL (ref 4.0–10.5)

## 2011-10-16 MED ORDER — ACETAMINOPHEN 325 MG PO TABS
650.0000 mg | ORAL_TABLET | Freq: Once | ORAL | Status: AC
  Administered 2011-10-16: 650 mg via ORAL
  Filled 2011-10-16: qty 2

## 2011-10-16 MED ORDER — OXYCODONE-ACETAMINOPHEN 5-325 MG PO TABS: 1.0000 | ORAL_TABLET | ORAL | Status: AC | PRN

## 2011-10-16 MED ORDER — MORPHINE SULFATE 10 MG/ML IJ SOLN
INTRAMUSCULAR | Status: AC
  Administered 2011-10-16: 4 mg via INTRAVENOUS
  Filled 2011-10-16: qty 1

## 2011-10-16 MED ORDER — MORPHINE SULFATE 4 MG/ML IJ SOLN: 6.0000 mg | Freq: Once | INTRAMUSCULAR | Status: DC

## 2011-10-16 MED ORDER — KETOROLAC TROMETHAMINE 30 MG/ML IJ SOLN
30.0000 mg | Freq: Once | INTRAMUSCULAR | Status: AC
  Administered 2011-10-16: 30 mg via INTRAVENOUS
  Filled 2011-10-16: qty 1

## 2011-10-16 NOTE — ED Notes (Signed)
02 Sats dropped while giving morphine (slowly IV)   Started on 02 at 2L / min / cannula

## 2011-10-16 NOTE — ED Notes (Signed)
EKG done

## 2011-10-16 NOTE — ED Notes (Signed)
Walked Patient with assistance to the restroom. Patient did very well.

## 2011-10-16 NOTE — ED Notes (Signed)
Patient complaining of bilateral leg pain and stiffness, bilateral shoulder pain off and on "for a while but worse today." Also states she has had chest pain off and on with last episode "sometime last week."

## 2011-10-16 NOTE — ED Provider Notes (Signed)
History     CSN: [id number]  Arrival date & time 10/16/11  1908   First MD Initiated Contact with Patient 10/16/11 1931      Chief Complaint  Patient presents with  . Chest Pain  . Leg Pain  . Shoulder Pain     HPI The patient reports pain generalized her entire body.  She has a history of fibromyalgia the jugular joint disease.  She takes Vicodin at home for the pain however she'll he took one Vicodin today.  She also has a history of rheumatoid arthritis for which she is on steroids and methotrexate.  Family reports that at times she becomes "stiff".  Therefore for the last several days she's had increasing "stiffness" but became worse today to the point where she was unable to get herself out of bed secondary to severe pain.  She denies dysuria or urinary frequency.  She said no chest pain shortness of breath.  She denies abdominal pain.  She's had no nausea vomiting or diarrhea.  She's had no fevers at home.  She reports even stretching her arms to shake my hand cause her severe pain in her shoulders.   Past Medical History  Diagnosis Date  . Anemia   . Back pain   . COPD (chronic obstructive pulmonary disease)   . Depression   . Fibromyalgia   . Hypercholesteremia   . Hypertension   . Insomnia   . Rheumatoid arteritis   . Cancer     Skin   . Vitamin d deficiency   . Hereditary peripheral neuropathy   . Generalized anxiety disorder   . GERD (gastroesophageal reflux disease)   . Bacterial pneumonia   . Fatty liver   . Hx of colonic polyps   . Internal hemorrhoids   . Diverticulosis   . Ulcer     stomach    Past Surgical History  Procedure Date  . Abdominal surgery     removed patial stomach from ulcer  . Abdominal hysterectomy   . Colonoscopy 04-2001    internal hemorrhoids, panocolonic diverticulosis, and colon polyps     Family History  Problem Relation Age of Onset  . Colon cancer Daughter 69  . Heart disease Father   . Kidney disease Mother     kidney  cancer    History  Substance Use Topics  . Smoking status: Never Smoker   . Smokeless tobacco: Never Used  . Alcohol Use: No    OB History    Grav Para Term Preterm Abortions TAB SAB Ect Mult Living                  Review of Systems  All other systems reviewed and are negative.    Allergies  Other; Tape; Lipitor; and Niaspan  Home Medications   Current Outpatient Rx  Name Route Sig Dispense Refill  . ALBUTEROL SULFATE HFA 108 (90 BASE) MCG/ACT IN AERS Inhalation Inhale 2 puffs into the lungs every 6 (six) hours as needed. For rescue    . ASPIRIN 81 MG PO TABS Oral Take 81 mg by mouth daily.     Marland Kitchen VITAMIN D3 1000 UNITS PO CAPS Oral Take 1 capsule by mouth daily.    . CYANOCOBALAMIN 100 MCG PO TABS Oral Take 100 mcg by mouth daily.    . DULOXETINE HCL 60 MG PO CPEP Oral Take 60 mg by mouth daily.      Marland Kitchen FERROUS GLUCONATE 325 MG PO TABS Oral Take 325 mg  by mouth daily.     Marland Kitchen FLUTICASONE-SALMETEROL 250-50 MCG/DOSE IN AEPB Inhalation Inhale 1 puff into the lungs every 12 (twelve) hours.    Marland Kitchen FOLIC ACID 1 MG PO TABS Oral Take 1 mg by mouth daily.    Marland Kitchen HYDROCODONE-ACETAMINOPHEN 5-500 MG PO TABS Oral Take 1 tablet by mouth every 6 (six) hours as needed.      Marland Kitchen LORAZEPAM 0.5 MG PO TABS Oral Take 0.5 mg by mouth every 8 (eight) hours as needed.     . METHOTREXATE 2.5 MG PO TABS Oral Take 12.5 mg by mouth once a week. *Taken on Thursdays at 1200Caution:Chemotherapy. Protect from light.    . NEBIVOLOL HCL 5 MG PO TABS Oral Take 5 mg by mouth daily.      Marland Kitchen PANTOPRAZOLE SODIUM 40 MG PO TBEC Oral Take 40 mg by mouth 2 (two) times daily.     Marland Kitchen POLYETHYLENE GLYCOL 3350 PO PACK Oral Take 17 g by mouth as directed.    Marland Kitchen PREDNISONE 5 MG PO TABS Oral Take 5 mg by mouth daily.      Marland Kitchen PREGABALIN 50 MG PO CAPS Oral Take 50 mg by mouth 2 (two) times daily.     Marland Kitchen PROBIOTIC FORMULA PO Oral Take by mouth.    Marland Kitchen RALOXIFENE HCL 60 MG PO TABS Oral Take 60 mg by mouth daily.    Marland Kitchen ROSUVASTATIN CALCIUM  20 MG PO TABS Oral Take 20 mg by mouth daily.     . TRAZODONE HCL 50 MG PO TABS Oral Take 50 mg by mouth at bedtime.    Marland Kitchen VITAMIN C 500 MG PO TABS Oral Take 500 mg by mouth daily.    . OXYCODONE-ACETAMINOPHEN 5-325 MG PO TABS Oral Take 1 tablet by mouth every 4 (four) hours as needed for pain. 30 tablet 0    BP 150/89  Pulse 79  Temp 98.2 F (36.8 C) (Oral)  Resp 16  Ht 5\' 2"  (1.575 m)  Wt 215 lb (97.523 kg)  BMI 39.32 kg/m2  SpO2 95%  Physical Exam  Nursing note and vitals reviewed. Constitutional: She is oriented to person, place, and time. She appears well-developed and well-nourished. No distress.  HENT:  Head: Normocephalic and atraumatic.  Eyes: EOM are normal. Pupils are equal, round, and reactive to light.  Neck: Normal range of motion.  Cardiovascular: Normal rate, regular rhythm and normal heart sounds.   Pulmonary/Chest: Effort normal and breath sounds normal.  Abdominal: Soft. She exhibits no distension. There is no tenderness.  Musculoskeletal: Normal range of motion.  Neurological: She is alert and oriented to person, place, and time.       5/5 strength in major muscle groups of  bilateral upper and lower extremities. Speech normal. No facial asymetry.   Skin: Skin is warm and dry.  Psychiatric: She has a normal mood and affect. Judgment normal.    ED Course  Procedures (including critical care time)   Date: 10/16/2011  Rate: 88  Rhythm: normal sinus rhythm  QRS Axis: normal  Intervals: normal  ST/T Wave abnormalities: normal  Conduction Disutrbances: none  Narrative Interpretation:   Old EKG Reviewed: No significant changes noted     Labs Reviewed  CBC - Abnormal; Notable for the following:    Platelets 129 (*)     All other components within normal limits  BASIC METABOLIC PANEL - Abnormal; Notable for the following:    Potassium 3.3 (*)     Glucose, Bld 157 (*)  Creatinine, Ser 1.15 (*)     GFR calc non Af Amer 43 (*)     GFR calc Af Amer  50 (*)     All other components within normal limits  TROPONIN I  URINALYSIS, ROUTINE W REFLEX MICROSCOPIC  URINE CULTURE   Dg Chest 2 View  10/16/2011  *RADIOLOGY REPORT*  Clinical Data: Shortness of breath.  Weakness.  Arm pain.  Leg pain.  CHEST - 2 VIEW  Comparison: 05/18/2011  Findings: Normal heart size and pulmonary vascularity.  Tortuous aorta.  No focal airspace consolidation in the lungs.  No blunting of costophrenic angles.  No pneumothorax.  No significant change since previous study.  IMPRESSION: No evidence of active pulmonary disease.  Original Report Authenticated By: Neale Burly, M.D.    I personally reviewed the imaging tests through PACS system  I reviewed available ER/hospitalization records thought the EMR   1. Generalized pain       MDM  10:38 PM The patient feels much better after IV pain medicine the emergency department.  I think this is a pain exacerbation and that her one Vicodin today was not enough to help with what appears to be severe degenerative joint disease, chronic pain and rheumatoid arthritis.  I think she'll benefit from seeing a pain specialist.  The patient was able to ambulate from her bed to the restroom in the emergency department with assistance.  She normally walks at home with a walker which she does not have with her in the ER.  She has a low-grade temperature 100.6 but her chest and urine are normal.  She is well appearing otherwise.  Baseline mental status.  Patient and family agreeable to outpatient followup with close PCP followup.  Patient understands return to the emergency department for new or symptoms        Hoy Morn, MD 10/16/11 2239

## 2011-10-16 NOTE — ED Notes (Signed)
Daughter given discharge instructions - is making phone calls for family to bring car back and family will transport her to daughters home for tonight

## 2011-10-18 LAB — URINE CULTURE: Culture: NO GROWTH

## 2011-11-23 ENCOUNTER — Encounter: Payer: Self-pay | Admitting: Internal Medicine

## 2011-11-23 ENCOUNTER — Ambulatory Visit (INDEPENDENT_AMBULATORY_CARE_PROVIDER_SITE_OTHER): Payer: Medicare Other | Admitting: Internal Medicine

## 2011-11-23 VITALS — BP 98/70 | HR 80 | Ht 61.25 in | Wt 219.0 lb

## 2011-11-23 DIAGNOSIS — M549 Dorsalgia, unspecified: Secondary | ICD-10-CM | POA: Insufficient documentation

## 2011-11-23 DIAGNOSIS — M797 Fibromyalgia: Secondary | ICD-10-CM | POA: Insufficient documentation

## 2011-11-23 DIAGNOSIS — R1012 Left upper quadrant pain: Secondary | ICD-10-CM

## 2011-11-23 DIAGNOSIS — R071 Chest pain on breathing: Secondary | ICD-10-CM

## 2011-11-23 DIAGNOSIS — M0520 Rheumatoid vasculitis with rheumatoid arthritis of unspecified site: Secondary | ICD-10-CM | POA: Insufficient documentation

## 2011-11-23 DIAGNOSIS — R0789 Other chest pain: Secondary | ICD-10-CM

## 2011-11-23 DIAGNOSIS — G608 Other hereditary and idiopathic neuropathies: Secondary | ICD-10-CM | POA: Insufficient documentation

## 2011-11-23 DIAGNOSIS — K219 Gastro-esophageal reflux disease without esophagitis: Secondary | ICD-10-CM | POA: Insufficient documentation

## 2011-11-23 DIAGNOSIS — J449 Chronic obstructive pulmonary disease, unspecified: Secondary | ICD-10-CM | POA: Insufficient documentation

## 2011-11-23 DIAGNOSIS — G609 Hereditary and idiopathic neuropathy, unspecified: Secondary | ICD-10-CM | POA: Insufficient documentation

## 2011-11-23 MED ORDER — SUCRALFATE 1 G PO TABS: 1.0000 g | ORAL_TABLET | Freq: Four times a day (QID) | ORAL | Status: DC

## 2011-11-23 NOTE — Progress Notes (Signed)
  Subjective:    Patient ID: [name], female    DOB: 14-Sep-1928, 78 y.o.   MRN: [id number]  HPI This elderly white woman returns with her daughter and followup, because of chronic left-sided abdominal pain. An upper GI endoscopy showed a normal postoperative anatomy with inflammatory polyp at the gastrojejunal anastomosis. A trial of Carafate was prescribed. She is not sure if he really helped. It sounds like she took it for a month and then did not refill it. As we talked further, she thinks maybe it didn't help.  She continues to have an intermittent pulling left sided abdominal pain in the upper quadrant area and extending up into the chest and flank area. She went to the emergency room in early August. The physician prescribed oxycodone, and recommended possible pain management. This was discussed with Dr. Nevada Crane and he did not agree with either these recommendations because of the patient's previous problems with mental status changes when on a pain patch that was probably a fentanyl patch. She has had 2 falls as well.  Medications, allergies, past medical history, past surgical history, family history and social history are reviewed and updated in the EMR.   Review of Systems Reduced mobility, uses a walker. She is very frustrated with a chronic recurrent pain problems.    Objective:   Physical Exam Obese, elderly woman chronically ill She has tenderness in the left lateral rib area mid to lower, the abdomen itself is mildly tender in the left upper quadrant but otherwise nontender.       Assessment & Plan:   1. LUQ pain   2. Left-sided chest wall pain    EGD and CT scanning (October 2012) have not revealed problems that would lead to a GI diagnosis causing her pain. The Carafate may help her some. She will retry that, though I cautioned her and daughter not to continue it if there is not much benefit.  I think most of her problems if not all or musculoskeletal in origin,  as well as possibly neuropathic from degenerative spine disease. Given previous problems with analgesia, it may not be possible to improve things much further.  Nonsteroidal anti-inflammatories or not contraindicated if she is on protective therapy with respect to the gut but using those chronically in an elderly person does pose increased risk of GI side effects as well as renal so would try to avoid those.  Other possible pain relief measures might be topical nonsteroidal therapy, i.e. no term patch possibly, and if available injection therapy with steroids into the area of pain and tenderness in the left chest wall. I have asked the patient and daughter to discuss this with Dr. Ouida Sills.  I will be available as needed for gastrointestinal problems but she is to return to the care of Dr. Nevada Crane, and her rheumatologist, Dr. Ouida Sills.   CC: Wende Neighbors, MD A Tobie Lords, MD

## 2011-11-23 NOTE — Patient Instructions (Addendum)
Dr. Carlean Purl refilled your Carafate.  Please keep your follow up appointments with Dr. Ouida Sills and Dr. Nevada Crane.  Thank you for choosing me and [facility].  Gatha Mayer, M.D., [facility]

## 2013-05-21 ENCOUNTER — Ambulatory Visit (HOSPITAL_COMMUNITY)
Admission: RE | Admit: 2013-05-21 | Discharge: 2013-05-21 | Disposition: A | Discharge status: Home / Self Care | Payer: Medicare Other | Source: Ambulatory Visit | Attending: Internal Medicine | Admitting: Internal Medicine

## 2013-05-21 DIAGNOSIS — J449 Chronic obstructive pulmonary disease, unspecified: Secondary | ICD-10-CM | POA: Insufficient documentation

## 2013-05-21 DIAGNOSIS — IMO0001 Reserved for inherently not codable concepts without codable children: Secondary | ICD-10-CM | POA: Insufficient documentation

## 2013-05-21 DIAGNOSIS — M25561 Pain in right knee: Secondary | ICD-10-CM | POA: Insufficient documentation

## 2013-05-21 DIAGNOSIS — M25569 Pain in unspecified knee: Secondary | ICD-10-CM | POA: Insufficient documentation

## 2013-05-21 DIAGNOSIS — R262 Difficulty in walking, not elsewhere classified: Secondary | ICD-10-CM | POA: Insufficient documentation

## 2013-05-21 DIAGNOSIS — J4489 Other specified chronic obstructive pulmonary disease: Secondary | ICD-10-CM | POA: Insufficient documentation

## 2013-05-21 NOTE — Evaluation (Signed)
Physical Therapy Evaluation  Patient Details  Name: [name] MRN: [id number] Date of Birth: December 15, 1928  Today's Date: 05/21/2013 Time: [id number] PT Time Calculation (min): 48 min Charges: Evaluation 1059-1110, Gait 1110 - 1122, therapeutic exercise 1123-1147             Visit#: 1 of 6  Re-eval: 06/20/13 Assessment Diagnosis: L knee pain and difficulty walking secondary to arthritis Prior Therapy: no  Authorization: Medicare     Past Medical History:  Past Medical History  Diagnosis Date  . Anemia   . Back pain   . COPD (chronic obstructive pulmonary disease)   . Depression   . Fibromyalgia   . Hypercholesteremia   . Hypertension   . Insomnia   . Rheumatoid arteritis   . Cancer     Skin   . Vitamin d deficiency   . Hereditary peripheral neuropathy   . Generalized anxiety disorder   . GERD (gastroesophageal reflux disease)   . Bacterial pneumonia   . Fatty liver   . Internal hemorrhoids   . Diverticulosis   . Ulcer     stomach  . Gastric polyp 09/08/11    at the anastomosis-inflammatory   Past Surgical History:  Past Surgical History  Procedure Laterality Date  . Abdominal surgery      removed patial stomach from ulcer  . Abdominal hysterectomy    . Colonoscopy  04-2001    internal hemorrhoids, panocolonic diverticulosis, and colon polyps   . Upper gastrointestinal endoscopy     Subjective Symptoms/Limitations Symptoms: Patient arrives with primary c/o left knee pain that is very sensitive to touch, no N/T, stabbing pain 0/10 at rest, 9/10 with activity. patient also complains of right knee pain, low back pain and difficulty breathing secondary to COPD.  Pertinent History: Patient reports chronic knee and low back pain for several years. Patient and care take states she was referred to PT to be assessed and treated to make sure she was an appropriate canidate for knee replacement and that she could tolerate the treatment neccesary post surgery. PMH:  rheumatoid aarthritis, COPD, hyperthyroidism, hyperlipidemia, gastroesophageal reflux disease, insomnia, anxiety, depression, hysterectom., degernerative disc disease, osteoporosis, anemia. patient is currently utilizing supplemental O2 for walking in the community, in home, and when sleeping, though she does not have it when she reports to therapy. Patient states that when she has her supplemental O2 she can ambulate further, though she ambulates very slow and no faster than during this session. How long can you sit comfortably?: 1 hour, limited due to back pain How long can you stand comfortably?: 68min limited by knee pain How long can you walk comfortably?: 51min limited by difficulty breathing.  Repetition: Increases Symptoms Pain Assessment Currently in Pain?: No/denies Pain Score: 9  (with activity) Pain Location: Knee Pain Orientation: Left (patient also has pain ion R knee, though it is less than Rt) Pain Type: Chronic pain Pain Onset: More than a month ago Pain Frequency: Intermittent  Balance Screening Balance Screen Has the patient fallen in the past 6 months: Yes How many times?: 2  Prior Functioning  Prior Function Level of Independence: Needs assistance with homemaking Vocation: Retired  Cognition/Observation Cognition Overall Cognitive Status: Within Functional Limits for tasks assessed  Sensation/Coordination/Flexibility/Functional Tests Sensation Light Touch: Appears Intact Flexibility Thomas: Positive  Assessment LUE Strength Left Elbow Flexion: 4/5 Left Elbow Extension: 4/5 LLE AROM (degrees) LLE Overall AROM Comments: hip IR 10 degrees, ER 30 degrees Left Knee Extension: 0 Left Knee Flexion: 120  LLE Strength Left Hip Flexion: 3/5 Left Hip Extension: 2/5 Left Hip ABduction: 3+/5 Left Hip ADduction: 4/5 Left Knee Flexion: 4/5 Left Knee Extension: 3/5 Left Ankle Dorsiflexion: 4/5  Exercise/Treatments Mobility/Balance  Bed Mobility Bed Mobility:   (WFL, SOB with laying Supine) Ambulation/Gait Ambulation/Gait: Yes Ambulation/Gait Assistance: 5: Supervision Ambulation/Gait Assistance Details: assistance for safety, due to poor balance and fatigue from shortness of breath Ambulation Distance (Feet): 115 Feet (2x in 23min) Assistive device: 4-wheeled walker Gait velocity: very slow  Seated Long Arc Quad: 10 reps;Both;AROM Supine Heel Slides: 10 reps;AROM;Both Bridges: 10 reps;Both;AROM Straight Leg Raises: Both;AROM;10 reps  Physical Therapy Assessment and Plan PT Assessment and Plan Clinical Impression Statement: Patient displays signs and symptoms consistent with deconditioning and COPD indiacting she will not be able to tolerate the requirements necessary to undergo TKA and the subsequent skilled physical therapy necessary for her to return to her PLOF;no chest pain noted. Patient arrived to therapy without supplemental O2 and has been requested to come in for a follow-up treatment session with her O2 so that a more relevant assessment may be given  as she cannot tolerate the neccessary treatment without supplemental O2.  During this session without supplumental O2, pantient ambulates <175ft  with 4 wheel walker requiring multiple rest breaks, with O2sat dropping to 86%; after 4 minutes of rest and pursed lip breathing patient is O2sat increases to 92%.  Patient performed 5x sit to stand requiring Bilat UE support to stand (patient is unable to stand without bilat UE support) in 41.73 sec indicating increased risk of falls. During performance of typical post surgery LE exercises patient's O2 desaturates to 86% and displays increased respiration with c/o SOB. Patient displayed minor improvement in O2 saturation durign gait when educated and properly performing pursed lip breathing though the required constant cuing to perform it correctly.  It is this therapist's opinion that this patient is not currently appropriate for TKA surgery as she will  not be able to tolerate the neccesary skilled rehab to return to her PLOF. Recommending patient return for a follow up visit with her supplemental O2 to determine if activity toelrance improves when given supplemental O2.   Pt will benefit from skilled therapeutic intervention in order to improve on the following deficits: Decreased activity tolerance;Decreased balance;Decreased mobility;Decreased range of motion;Decreased strength;Difficulty walking Rehab Potential: Fair Clinical Impairments Affecting Rehab Potential: COPD, rheumatoid arthritis, insomnia, anemia PT Frequency: Min 2X/week PT Duration: 3 weeks, Patient will see doctor next week for reassessment prior to surgery  PT Treatment/Interventions: Gait training;Functional mobility training;Therapeutic activities;Therapeutic exercise;Balance training    Goals Home Exercise Program Pt/caregiver will Perform Home Exercise Program: For increased strengthening;With Supervision, verbal cues required/provided PT Goal: Perform Home Exercise Program - Progress: Goal set today PT Short Term Goals Time to Complete Short Term Goals: 2 weeks PT Short Term Goal 1: Patient will perform sit to stand <30 second  with bilat UE support to indicate improving strength needed to stand safely as required post TKA PT Short Term Goal 1 - Progress: Progressing toward goal PT Short Term Goal 2: patient will be able to walk > 322ft  with O2 >93 throughout to indicate patient is safe for surgery.  PT Short Term Goal 2 - Progress: Progressing toward goal  Problem List Patient Active Problem List   Diagnosis Date Noted  . Knee pain 05/21/2013  . Fibromyalgia   . COPD (chronic obstructive pulmonary disease)   . Hereditary peripheral neuropathy   . GERD (gastroesophageal reflux disease)   .  Rheumatoid arteritis   . Back pain   . Fracture of distal fibula 09/20/2010    PT Plan of Care PT Home Exercise Plan: Patient will perform walking as tolerated, and  perform multip bouts of sit to stand to increase strength and activity toelrance.  PT Patient Instructions: Perform mltiple bouts of walkign daily.  Consulted and Agree with Plan of Care: Patient  GP Functional Assessment Tool Used: Mobility, Functional assessment, limited gait, Five time sit to stand Functional Limitation: Mobility: Walking and moving around Mobility: Walking and Moving Around Current Status [phone]): At least 60 percent but less than 80 percent impaired, limited or restricted Mobility: Walking and Moving Around Goal Status [phone]): At least 40 percent but less than 60 percent impaired, limited or restricted  Devona Konig R DPT  05/21/2013, 2:50 PM  Physician Documentation Your signature is required to indicate approval of the treatment plan as stated above.  Please sign and either send electronically or make a copy of this report for your files and return this physician signed original.   Please mark one 1.__approve of plan  2. ___approve of plan with the following conditions.   ______________________________                                                          _____________________ Physician Signature                                                                                                             Date

## 2013-05-22 ENCOUNTER — Telehealth (HOSPITAL_COMMUNITY): Payer: Self-pay

## 2013-05-23 ENCOUNTER — Ambulatory Visit (HOSPITAL_COMMUNITY): Payer: Medicare Other | Admitting: Physical Therapy

## 2013-05-28 ENCOUNTER — Encounter (HOSPITAL_COMMUNITY): Admission: RE | Payer: Self-pay | Source: Ambulatory Visit

## 2013-05-28 ENCOUNTER — Ambulatory Visit (HOSPITAL_COMMUNITY)
Admission: RE | Admit: 2013-05-28 | Discharge: 2013-05-28 | Disposition: A | Discharge status: Home / Self Care | Payer: Medicare Other | Source: Ambulatory Visit | Attending: Internal Medicine | Admitting: Internal Medicine

## 2013-05-28 ENCOUNTER — Inpatient Hospital Stay (HOSPITAL_COMMUNITY): Admission: RE | Admit: 2013-05-28 | Payer: Medicare Other | Source: Ambulatory Visit | Admitting: Orthopedic Surgery

## 2013-05-28 SURGERY — ARTHROPLASTY, KNEE, TOTAL
Anesthesia: Spinal | Site: Knee | Laterality: Left

## 2013-05-28 NOTE — Progress Notes (Signed)
Physical Therapy Treatment Patient Details  Name: [name] MRN: [id number] Date of Birth: October 29, 1928  Today's Date: 05/28/2013 Time: 0930-1030 PT Time Calculation (min): 60 min Charges: Therapeutic Exercise [phone], Gait Training 1015-1030  Visit#: 2 of  10  Re-eval: 06/20/13 Assessment Diagnosis: L knee pain and difficulty walking secondary to arthritis Prior Therapy: no  Authorization: Medicare   Authorization Visit#: 2 of 10   Subjective: Symptoms/Limitations Symptoms: Patient arrives and has shortness of breath upon walking into clinic. Patient states that she is confused about potential for knee replacement or if she will be recieving treatment for her knee.  Pertinent History: Patient reports chronic knee and low back pain for several years. Patient and care take states she was referred to PT to be assessed and treated to make sure she was an appropriate canidate for knee replacement and that she could tolerate the treatment neccesary post surgery. PMH: rheumatoid aarthritis, COPD, hyperthyroidism, hyperlipidemia, gastroesophageal reflux disease, insomnia, anxiety, depression, hysterectom., degernerative disc disease, osteoporosis, anemia. patient is currently utilizing supplemental O2 for walking in the community, in home, and when sleeping, though she does not have it when she reports to therapy.  Pain Assessment Currently in Pain?: Yes (on Nustep) Pain Score: 5  Pain Location: Knee Pain Orientation: Left Pain Type: Chronic pain Pain Onset: More than a month ago Pain Frequency: Intermittent Pain Relieving Factors: rest from activity/weightbearing  Exercise/Treatments Mobility/Balance  Ambulation/Gait Ambulation/Gait: Yes Ambulation/Gait Assistance: 5: Supervision Ambulation/Gait Assistance Details: assistance for safety, due to poor balance and fatigue Ambulation Distance (Feet): 260 Feet Assistive device: 4-wheeled walker Gait Pattern: Decreased stride  length Gait velocity: very slow    Standing Gait Training: 6 miinute walk test: 260 ft (Age and gender norm is 335ft) ((<200 indicative of hospitalization and mortality)) Other Standing Knee Exercises: 5x sit to stand Seated Other Seated Knee Exercises: Nustep Lvl 1, 10 minutes SPM >40 Physical Therapy Assessment and Plan PT Assessment and Plan Clinical Impression Statement: Patient arrives to therapy today with O2. Patient and care taker state extreme confusion over plan of care  and do not understand whether or not patient will be recieving a knee replacement and want to know if PT will be treating her back or not. Patient was educated on inability to treat low back pain without a prescription from her physician to treat it. Patient educated on plan of car eand need for improved  activity/exercise tolerance as she is currently at significant risk of falls and hospitalization. Patient 6 minute walk test of 275ft and  5x sit to stand of 60s seconds are indicative of poor activity tolerance, increased risk of hospitalization/mortality, increased risk of falls, and poor LE power/strength. Patient was able to better tolerate treatment this session while on supplemental oxygen. Patient required supplemental Oxygen for all activities as patient was unable to walk >63ft without supplemental Oxygen due to SOB secondary to Oxygen desaturation.  Patient educated on home walking plan for which she said she would do he best. At this time patient is not appropriate to recieve an TKA  due to weakness, limited activity tolerance, and secondary factors including severe COPD, Poor balance, and poor post surgery rehabilitation potential. Patientis appropriate to continue skilled physical therapy to increase bilateral LE strength and exercise/activity tolerance so that patient may be able to tolerate the TKA surgery and then perform the necessary rehabilitation to return to her prior level of function. Therapist contacted  referring physician who indicated he was satisfied with the current plan of  care of 2x week for 4 weeks for activity tolerance training and LE strengthening to prepare patient for potential knee replacement.   Pt will benefit from skilled therapeutic intervention in order to improve on the following deficits: Decreased activity tolerance;Decreased balance;Decreased mobility;Decreased range of motion;Decreased strength;Difficulty walking  Clinical Impairments Affecting Rehab Potential: COPD, rheumatoid arthritis, insomnia, anemia PT Frequency: Min 2X/week PT Duration: 4 weeks PT Treatment/Interventions: Gait training;Functional mobility training;Therapeutic activities;Therapeutic exercise;Balance training PT Plan: Primary focus on increasing exercise tolerance via gait training and improving LE strength to prepare for potential knee replacment.     Goals PT Short Term Goals Time to Complete Short Term Goals: 3 weeks PT Short Term Goal 1: Patient will perform sit to stand <30 second  without bilat UE support to indicate improving strength needed to stand safely as required post TKA PT Short Term Goal 2: patient will be able to walk > 375ft  with O2 >93 throughout to indicate patient is safe for surgery.   Problem List Patient Active Problem List   Diagnosis Date Noted  . Knee pain 05/21/2013  . Fibromyalgia   . COPD (chronic obstructive pulmonary disease)   . Hereditary peripheral neuropathy   . GERD (gastroesophageal reflux disease)   . Rheumatoid arteritis   . Back pain   . Fracture of distal fibula 09/20/2010    PT - End of Session Activity Tolerance: Patient limited by fatigue;Patient limited by pain General Behavior During Therapy: [facility] for tasks assessed/performed PT Plan of Care PT Home Exercise Plan: Patient will perform walking as tolerated, and perform multip bouts of sit to stand to increase strength and activity toelrance.  PT Patient Instructions: Perform mltiple bouts of  walking daily.  Consulted and Agree with Plan of Care: Patient  GP Functional Assessment Tool Used: Mobility, Functional assessment, limited gait, Five time sit to stand  Anzal Bartnick R DPT 05/28/2013, 11:33 AM

## 2013-05-30 ENCOUNTER — Ambulatory Visit (HOSPITAL_COMMUNITY)
Admission: RE | Admit: 2013-05-30 | Discharge: 2013-05-30 | Disposition: A | Discharge status: Home / Self Care | Payer: Medicare Other | Source: Ambulatory Visit | Attending: Internal Medicine | Admitting: Internal Medicine

## 2013-05-30 DIAGNOSIS — M25569 Pain in unspecified knee: Secondary | ICD-10-CM

## 2013-05-30 NOTE — Progress Notes (Signed)
Physical Therapy Treatment Patient Details  Name: [name] MRN: [id number] Date of Birth: 06/12/1928  Today's Date: 05/30/2013 Time: 1015-1101 PT Time Calculation (min): 46 min Charges: 30 min therapeutic exercise, 16 min gait training  Visit#: 3 of 6  Re-eval: 06/20/13 Assessment Diagnosis: L knee pain and difficulty walking secondary to arthritis Prior Therapy: no  Authorization: Medicare  Authorization Time Period:    Authorization Visit#: 1 of 10   Subjective: Symptoms/Limitations Symptoms: Patient states tht her back and knee are hurtiing today. Patient states her back is hurting just sitting, though knees do not hurt. Knees hurt wth weight bearing.  Pertinent History: Patient reports chronic knee and low back pain for several years. Patient and care take states she was referred to PT to be assessed and treated to make sure she was an appropriate canidate for knee replacement and that she could tolerate the treatment neccesary post surgery. PMH: rheumatoid aarthritis, COPD, hyperthyroidism, hyperlipidemia, gastroesophageal reflux disease, insomnia, anxiety, depression, hysterectom., degernerative disc disease, osteoporosis, anemia. patient is currently utilizing supplemental O2 for walking in the community, in home, and when sleeping, though she does not have it when she reports to therapy.  How long can you sit comfortably?: 1 hour, limited due to back pain How long can you stand comfortably?: 82min limited by knee pain How long can you walk comfortably?: 50min limited by difficulty breathing.  Repetition: Increases Symptoms Pain Assessment Currently in Pain?: Yes (unable to rate due to difficulty finding a number that accurately describes it.) Pain Location: Back  Exercise/Treatments Walk 2 min with 1 minute rest 4x 5x sit to stand 3x 1 min rest between bouts Nu-step 10 minutes lvl 2 50spm  Physical Therapy Assessment and Plan PT Assessment and Plan Clinical  Impression Statement: Patient displays increased pain in back and knees. Patient is now able to identify when pain comes on specifically she has increased knee pain with standing, walking and activity, while she has increased back pain with sitting. Patient displays improved activity tolerance today walking 4x39min (12ft each bout). During gait patient requires cues to correct posture and walk erect which also decreases patient's shortness of breath. Patient given home walking program that utilizes short walking intervals with equal or slightly shorter rest breaks to increase patient's activity tolerance. Upon completion of session patient states agreement to perform walking program, though prior adhearance to HEP hs been rather low according to her care taker.  Pt will benefit from skilled therapeutic intervention in order to improve on the following deficits: Decreased activity tolerance;Decreased balance;Decreased mobility;Decreased range of motion;Decreased strength;Difficulty walking;Abnormal gait Rehab Potential: Fair Clinical Impairments Affecting Rehab Potential: COPD, rheumatoid arthritis, insomnia, anemia PT Frequency: Min 2X/week PT Duration: 4 weeks PT Treatment/Interventions: Gait training;Functional mobility training;Therapeutic activities;Therapeutic exercise;Balance training PT Plan: Shift focus to addressing LE and UE stregth/mobility deficits and improving posture. Continue to progress home walking program per patient's tolerance.     Problem List Patient Active Problem List   Diagnosis Date Noted  . Knee pain 05/21/2013  . Fibromyalgia   . COPD (chronic obstructive pulmonary disease)   . Hereditary peripheral neuropathy   . GERD (gastroesophageal reflux disease)   . Rheumatoid arteritis   . Back pain   . Fracture of distal fibula 09/20/2010    PT - End of Session Equipment Utilized During Treatment: Gait belt Activity Tolerance: Patient limited by fatigue;Patient limited  by pain General Behavior During Therapy: [facility] for tasks assessed/performed PT Plan of Care PT Home Exercise Plan: Patient  will perform walking as tolerated, and perform multip bouts of sit to stand to increase strength and activity toelrance.  PT Patient Instructions: Perform mltiple bouts of walking daily.  Consulted and Agree with Plan of Care: Patient;Family member/caregiver Family Member Consulted: aid  GP Functional Assessment Tool Used: Mobility, Functional assessment, limited gait, Five time sit to stand Functional Limitation: Mobility: Walking and moving around  Kimberly-Clark R 05/30/2013, 11:38 AM

## 2013-06-04 ENCOUNTER — Ambulatory Visit (HOSPITAL_COMMUNITY): Payer: Medicare Other | Admitting: Physical Therapy

## 2013-06-04 ENCOUNTER — Ambulatory Visit (HOSPITAL_COMMUNITY)
Admission: RE | Admit: 2013-06-04 | Discharge: 2013-06-04 | Disposition: A | Discharge status: Home / Self Care | Payer: Medicare Other | Source: Ambulatory Visit | Attending: Internal Medicine | Admitting: Internal Medicine

## 2013-06-04 DIAGNOSIS — M25569 Pain in unspecified knee: Secondary | ICD-10-CM

## 2013-06-04 NOTE — Progress Notes (Addendum)
Physical Therapy Treatment Patient Details  Name: [name] MRN: [id number] Date of Birth: 1928/06/07  Today's Date: 06/04/2013 Time: 1100-1146 PT Time Calculation (min): 46 min Charges: Therapeutic exercise 35, Manual 10  Visit#: 4 of 6  Re-eval: 06/20/13 Assessment Diagnosis: L knee pain and difficulty walking secondary to arthritis Prior Therapy: no  Authorization: Medicare  Authorization Time Period:    Authorization Visit#: 4 of 10   Subjective: Symptoms/Limitations Symptoms: Patient states tht her knees are hurtiing today more than last session  Pertinent History: Patient reports chronic knee and low back pain for several years. Patient and care take states she was referred to PT to be assessed and treated to make sure she was an appropriate canidate for knee replacement and that she could tolerate the treatment neccesary post surgery. PMH: rheumatoid aarthritis, COPD, hyperthyroidism, hyperlipidemia, gastroesophageal reflux disease, insomnia, anxiety, depression, hysterectom., degernerative disc disease, osteoporosis, anemia. patient is currently utilizing supplemental O2 for walking in the community, in home, and when sleeping, though she does not have it when she reports to therapy.  How long can you sit comfortably?: 1 hour, limited due to back pain How long can you stand comfortably?: 56min limited by knee pain How long can you walk comfortably?: 25min limited by difficulty breathing.  Repetition: Increases Symptoms Pain Assessment Currently in Pain?: Yes Pain Score: 8  Pain Location: Knee Pain Orientation: Left Pain Type: Chronic pain Pain Onset: More than a month ago Pain Frequency: Constant Pain Relieving Factors: NWB  Precautions/Restrictions     Exercise/Treatments Nustep: 12min UE and LE at Lvl 1 3D hip excursions 10x, 3d hip excursions with UE reaches 10x Side stepping 10x, Mini squats 10x,  scapular retraction 20x,  Red t-band rows 10x  Manual  Therapy Manual Therapy: Massage Massage: STM to ITB, hamstring, groin.   Physical Therapy Assessment and Plan PT Assessment and Plan Clinical Impression Statement: Patient displays poor weight berign tolerance especially with any actvities requiring knee flexion. Following 3 treatmeants it is clear that patient does not have exercise tolerance and activity tolerance neccesary for knee or back surgery and then subsequent therapy required for such pocedures.. PT will continue to treat patient to increase patient's hip, knee, and low back mobility to decrease pain and help patient to better tolerate walking and cardioprogram. Patient and care taker were instructed in need to bu a portable cycle ergometer to allowpatient to perform NWBing cardiovascular exercise at home to build up activity tolerance.  Patient's mobility is particularly limited by  hamstring, ITB, and glut tightness.  Pt will benefit from skilled therapeutic intervention in order to improve on the following deficits: Decreased activity tolerance;Decreased balance;Decreased mobility;Decreased range of motion;Decreased strength;Difficulty walking;Abnormal gait Rehab Potential: Fair Clinical Impairments Affecting Rehab Potential: COPD, rheumatoid arthritis, insomnia, anemia PT Duration: 4 weeks PT Treatment/Interventions: Gait training;Functional mobility training;Therapeutic activities;Therapeutic exercise;Balance training PT Plan: Shift focus to addressing spine, LE and UE stregth/mobility deficits and improving posture. Continue to progress home walking program per patient's tolerance.    Goals Home Exercise Program Pt/caregiver will Perform Home Exercise Program: For increased strengthening;With Supervision, verbal cues required/provided PT Short Term Goals Time to Complete Short Term Goals: 3 weeks PT Short Term Goal 1: Patient will perform sit to stand <30 second  without bilat UE support to indicate improving strength needed to  stand safely as required post TKA PT Short Term Goal 1 - Progress: Progressing toward goal PT Short Term Goal 2: patient will be able to walk > 327ft  with O2 >93 throughout to indicate patient is safe for surgery.  PT Short Term Goal 2 - Progress: Progressing toward goal  Problem List Patient Active Problem List   Diagnosis Date Noted  . Knee pain 05/21/2013  . Fibromyalgia   . COPD (chronic obstructive pulmonary disease)   . Hereditary peripheral neuropathy   . GERD (gastroesophageal reflux disease)   . Rheumatoid arteritis   . Back pain   . Fracture of distal fibula 09/20/2010    PT - End of Session Equipment Utilized During Treatment: Gait belt Activity Tolerance: Patient limited by fatigue;Patient limited by pain General Behavior During Therapy: [facility] for tasks assessed/performed PT Plan of Care PT Home Exercise Plan: Patient will perform walking as tolerated, and perform multip bouts of sit to stand to increase strength and activity toelrance. Also instructed in HEP for hip and back mobility including 3D hip excursions and 3D hip excursions with UE reaching.  PT Patient Instructions: Perform mltiple bouts of walking daily.  Consulted and Agree with Plan of Care: Patient;Family member/caregiver Family Member Consulted: aid  GP Functional Assessment Tool Used: Mobility, Functional assessment, limited gait, Five time sit to stand Functional Limitation: Mobility: Walking and moving around Mobility: Walking and Moving Around Current Status [phone]): At least 60 percent but less than 80 percent impaired, limited or restricted Mobility: Walking and Moving Around Goal Status [phone]): At least 40 percent but less than 60 percent impaired, limited or restricted  Rasaan Brotherton R PT DPT 06/04/2013, 12:06 PM

## 2013-06-06 ENCOUNTER — Ambulatory Visit (HOSPITAL_COMMUNITY)
Admission: RE | Admit: 2013-06-06 | Discharge: 2013-06-06 | Disposition: A | Discharge status: Home / Self Care | Payer: Medicare Other | Source: Ambulatory Visit | Attending: Internal Medicine | Admitting: Internal Medicine

## 2013-06-06 ENCOUNTER — Ambulatory Visit (HOSPITAL_COMMUNITY): Payer: Medicare Other | Admitting: Physical Therapy

## 2013-06-06 DIAGNOSIS — M25569 Pain in unspecified knee: Secondary | ICD-10-CM

## 2013-06-06 NOTE — Progress Notes (Signed)
Physical Therapy Treatment Patient Details  Name: [name] MRN: [id number] Date of Birth: February 28, 1929  Today's Date: 06/06/2013 Time: 1100-1150 PT Time Calculation (min): 50 min Charges: 1100-1130 Therapeutic exercise. [id number] Manual   Visit#: 5 of 6  Re-eval: 06/20/13 Assessment Diagnosis: L knee pain and difficulty walking secondary to arthritis Prior Therapy: no  Authorization: Medicare  Authorization Time Period:    Authorization Visit#: 4 of 10   Subjective: Symptoms/Limitations Symptoms: Patient states tht her knees and back are beco ming increasingly sore after that last 3 sessions are hurtiing today more than last session  Pertinent History: Patient reports chronic knee and low back pain for several years. Patient and care take states she was referred to PT to be assessed and treated to make sure she was an appropriate canidate for knee replacement and that she could tolerate the treatment neccesary post surgery. PMH: rheumatoid aarthritis, COPD, hyperthyroidism, hyperlipidemia, gastroesophageal reflux disease, insomnia, anxiety, depression, hysterectom., degernerative disc disease, osteoporosis, anemia. patient is currently utilizing supplemental O2 for walking in the community, in home, and when sleeping, though she does not have it when she reports to therapy.  How long can you sit comfortably?: 1 hour, limited due to back pain How long can you stand comfortably?: 23mn limited by knee pain How long can you walk comfortably?: 533m limited by difficulty breathing.  Repetition: Increases Symptoms Pain Assessment Currently in Pain?: Yes Pain Score: 9  Pain Location: Knee Pain Orientation: Left Pain Type: Chronic pain Pain Onset: More than a month ago Pain Frequency: Constant  Exercise/Treatments Mobility/Balance  Ambulation/Gait Ambulation/Gait: Yes Ambulation/Gait Assistance: 5: Supervision Ambulation/Gait Assistance Details: Patient ambulated into and our  of clinic with noted increased antalgic gait with decreased step length and increased reliance on 4w walker Assistive device: 4-wheeled walker Gait Pattern: Decreased stride length;Trunk flexed Gait velocity: very slow    Standing Other Standing Knee Exercises: 3D hip excursions 10x, 3D hip excursions with OH arm drives [id number] stepping each, 10x mini squat Seated Long Arc Quad: 10 reps;AROM;Both Supine Heel Slides: 10 reps Straight Leg Raises: AROM;10 reps;Both (supine and side laying )  Manual Therapy Manual Therapy: Joint mobilization Joint Mobilization: Grad 3-4 distraction tibia-femoral joint.  Massage: STM of quad, ITB and calf  Physical Therapy Assessment and Plan PT Assessment and Plan Clinical Impression Statement: Patient displays poor weight bering tolerance especially with any actvities requiring knee flexion. Following 4 treatmeants it is clear that patient does not have exercise tolerance or capacity neccesary for knee or back surgery and then subsequent therapy required for such pocedures.. PT will continue to treat patient to increase patient's hip, knee, and low back mobility to decrease pain and help patient to better tolerate walking and cardioprogram. Patient and care taker were instructed in need to bu a portable cycle ergometer to allowpatient to perform NWBing cardiovascular exercise at home to build up activity tolerance. Patient could only tolerate NWB activities today secondary to pain  Pt will benefit from skilled therapeutic intervention in order to improve on the following deficits: Decreased activity tolerance;Decreased balance;Decreased mobility;Decreased range of motion;Decreased strength;Difficulty walking;Abnormal gait Rehab Potential: Fair Clinical Impairments Affecting Rehab Potential: COPD, rheumatoid arthritis, insomnia, anemia PT Frequency: Min 2X/week PT Duration: 4 weeks PT Treatment/Interventions: Gait training;Functional mobility  training;Therapeutic activities;Therapeutic exercise;Balance training PT Plan: Shift focus to addressing spine, LE and UE stregth/mobility deficits and improving posture. Continue to progress home walking program per patient's tolerance.    Goals Home Exercise Program Pt/caregiver will Perform Home  Exercise Program: For increased strengthening;With Supervision, verbal cues required/provided PT Goal: Perform Home Exercise Program - Progress: Met PT Short Term Goals Time to Complete Short Term Goals: 2 weeks PT Short Term Goal 1: Patient will perform sit to stand <30 second  without bilat UE support to indicate improving strength needed to stand safely as required post TKA PT Short Term Goal 1 - Progress: Progressing toward goal PT Short Term Goal 2: patient will be able to walk > 340f  with O2 >93 throughout to indicate patient is safe for surgery.  PT Short Term Goal 2 - Progress: Progressing toward goal  Problem List Patient Active Problem List   Diagnosis Date Noted  . Knee pain 05/21/2013  . Fibromyalgia   . COPD (chronic obstructive pulmonary disease)   . Hereditary peripheral neuropathy   . GERD (gastroesophageal reflux disease)   . Rheumatoid arteritis   . Back pain   . Fracture of distal fibula 09/20/2010    PT - End of Session Equipment Utilized During Treatment: Gait belt Activity Tolerance: Patient limited by fatigue;Patient limited by pain [facility] PT Plan of Care PT Home Exercise Plan: Patient will perform walking as tolerated, and perform multip bouts of sit to stand to increase strength and activity toelrance. Also instructed in HEP for hip and back mobility including 3D hip excursions and 3D hip excursions with UE reaching.  PT Patient Instructions: Perform mltiple bouts of walking daily.  Consulted and Agree with Plan of Care: Patient;Family member/caregiver Family Member Consulted: aid  GP Functional  Assessment Tool Used: Mobility, Functional assessment, limited gait, Five time sit to stand Functional Limitation: Mobility: Walking and moving around Mobility: Walking and Moving Around Current Status [phone]: At least 60 percent but less than 80 percent impaired, limited or restricted Mobility: Walking and Moving Around Goal Status [phone]: At least 40 percent but less than 60 percent impaired, limited or restricted Mobility: Walking and Moving Around Discharge Status [phone]: At least 40 percent but less than 60 percent impaired, limited or restricted  Chiron Campione R PT DPT 06/06/2013, 12:28 PM

## 2013-06-10 ENCOUNTER — Ambulatory Visit (HOSPITAL_COMMUNITY)
Admission: RE | Admit: 2013-06-10 | Discharge: 2013-06-10 | Disposition: A | Discharge status: Home / Self Care | Payer: Medicare Other | Source: Ambulatory Visit | Attending: Internal Medicine | Admitting: Internal Medicine

## 2013-06-10 ENCOUNTER — Ambulatory Visit (HOSPITAL_COMMUNITY): Payer: Medicare Other | Admitting: Physical Therapy

## 2013-06-10 DIAGNOSIS — M25569 Pain in unspecified knee: Secondary | ICD-10-CM

## 2013-06-10 NOTE — Progress Notes (Signed)
Physical Therapy Treatment Patient Details  Name: [name] MRN: [id number] Date of Birth: 1928-11-06  Today's Date: 06/10/2013 Time: 1100-1145 PT Time Calculation (min): 45 min Charges: 1100-1135 therapeutic exercise, 1035-1045 manual   Visit#: 6 of 10  Re-eval: 06/20/13 Assessment Diagnosis: L knee pain and difficulty walking secondary to arthritis Prior Therapy: no  Authorization: Medicare  Authorization Time Period:    Authorization Visit#: 4 of     Subjective: Symptoms/Limitations Symptoms: Patient states continued pain though no more no less. Patient states she performed her exercises twice daily since last session thought she did no other physical activity. Patient states she would like to walk but has no time and it is too painful (L Knee > R knee)  Pertinent History: Patient reports chronic knee and low back pain for several years. Patient and care take states she was referred to PT to be assessed and treated to make sure she was an appropriate canidate for knee replacement and that she could tolerate the treatment neccesary post surgery. PMH: rheumatoid aarthritis, COPD, hyperthyroidism, hyperlipidemia, gastroesophageal reflux disease, insomnia, anxiety, depression, hysterectom., degernerative disc disease, osteoporosis, anemia. patient is currently utilizing supplemental O2 for walking in the community, in home, and when sleeping, though she does not have it when she reports to therapy.  How long can you sit comfortably?: 1 hour, limited due to back pain How long can you stand comfortably?: 39min limited by knee pain How long can you walk comfortably?: 59min limited by difficulty breathing.  Repetition: Increases Symptoms Pain Assessment Currently in Pain?: Yes Pain Score: 5  Pain Location: Knee Pain Orientation: Left Pain Type: Chronic pain Pain Onset: More than a month ago Pain Frequency: Constant Pain Relieving Factors: NWB, massage,    Exercise/Treatments Mobility/Balance  Ambulation/Gait Ambulation/Gait: Yes Ambulation/Gait Assistance: 5: Supervision Ambulation/Gait Assistance Details: Patient displays SOB after 9minute Ambulation Distance (Feet): 150 Feet (in 91minutes) Assistive device: 4-wheeled walker Gait Pattern: Decreased stride length;Trunk flexed Gait velocity: very slow    Standing Forward Step Up: Step Height: 4";5 sets;2 sets;Both Other Standing Knee Exercises: 3D hip excursions 10x, 3D hip excursions with OH arm drives 93Z, 16R side stepping each, 10x mini squat Seated Long Arc Quad: 20 reps;Both;Strengthening (5lb DB) Other Seated Knee Exercises: Thoracic spine excursions, Scapular distraction 10x each  Manual Therapy Manual Therapy: Joint mobilization Joint Mobilization: grade 3-4 distraction of tibia-femoral joint, medial patellar glides grade 2 Massage: STM quad  Physical Therapy Assessment and Plan PT Assessment and Plan Clinical Impression Statement: Patient displays poor weight bearing tolerance especially with any actvities requiring knee flexion and loading. PT will continue to treat patient to increase patient's hip, knee, and low back mobility to decrease pain and help patient to better tolerate walking and cardio-program. Patient and care taker were instructed in need to perform NWBing cardiovascular exercise at home to build up activity tolerance. Patient was better able to tolerate WBing activities today including step ups to 4" box. Pt will benefit from skilled therapeutic intervention in order to improve on the following deficits: Decreased activity tolerance;Decreased balance;Decreased mobility;Decreased range of motion;Decreased strength;Difficulty walking;Abnormal gait Rehab Potential: Fair Clinical Impairments Affecting Rehab Potential: COPD, rheumatoid arthritis, insomnia, anemia PT Frequency: Min 2X/week PT Duration: 4 weeks PT Treatment/Interventions: Gait training;Functional  mobility training;Therapeutic activities;Therapeutic exercise;Balance training PT Plan: Shift focus to addressing spine, LE and UE stregth/mobility deficits and improving posture. Continue to progress home walking program per patient's tolerance.    Goals Home Exercise Program Pt/caregiver will Perform Home Exercise Program: For  increased strengthening;With Supervision, verbal cues required/provided PT Goal: Perform Home Exercise Program - Progress: Met PT Short Term Goals PT Short Term Goal 1: Patient will perform sit to stand <30 second  without bilat UE support to indicate improving strength needed to stand safely as required post TKA PT Short Term Goal 1 - Progress: Progressing toward goal PT Short Term Goal 2: patient will be able to walk > 386ft  with O2 >93 throughout to indicate patient is safe for surgery.  PT Short Term Goal 2 - Progress: Progressing toward goal  Problem List Patient Active Problem List   Diagnosis Date Noted  . Knee pain 05/21/2013  . Fibromyalgia   . COPD (chronic obstructive pulmonary disease)   . Hereditary peripheral neuropathy   . GERD (gastroesophageal reflux disease)   . Rheumatoid arteritis   . Back pain   . Fracture of distal fibula 09/20/2010    PT - End of Session Equipment Utilized During Treatment: Gait belt Activity Tolerance: Patient limited by fatigue;Patient limited by pain General Behavior During Therapy: [facility] for tasks assessed/performed PT Plan of Care PT Home Exercise Plan: Patient will perform walking as tolerated, and perform multip bouts of sit to stand to increase strength and activity toelrance. Also instructed in HEP for hip and back mobility including 3D hip excursions and 3D hip excursions with UE reaching.  PT Patient Instructions: Perform mltiple bouts of walking daily.  Consulted and Agree with Plan of Care: Patient;Family member/caregiver Family Member Consulted: aid  GP    Naryiah Schley R PT DPT 06/10/2013, 12:13  PM

## 2013-06-13 ENCOUNTER — Inpatient Hospital Stay (HOSPITAL_COMMUNITY): Admission: RE | Admit: 2013-06-13 | Payer: Medicare Other | Source: Ambulatory Visit | Admitting: Physical Therapy

## 2013-06-18 ENCOUNTER — Ambulatory Visit (HOSPITAL_COMMUNITY)
Admission: RE | Admit: 2013-06-18 | Discharge: 2013-06-18 | Disposition: A | Discharge status: Home / Self Care | Payer: Medicare Other | Source: Ambulatory Visit | Attending: Internal Medicine | Admitting: Internal Medicine

## 2013-06-18 DIAGNOSIS — R262 Difficulty in walking, not elsewhere classified: Secondary | ICD-10-CM | POA: Insufficient documentation

## 2013-06-18 DIAGNOSIS — M25569 Pain in unspecified knee: Secondary | ICD-10-CM | POA: Insufficient documentation

## 2013-06-18 DIAGNOSIS — J449 Chronic obstructive pulmonary disease, unspecified: Secondary | ICD-10-CM | POA: Insufficient documentation

## 2013-06-18 DIAGNOSIS — J4489 Other specified chronic obstructive pulmonary disease: Secondary | ICD-10-CM | POA: Insufficient documentation

## 2013-06-18 DIAGNOSIS — IMO0001 Reserved for inherently not codable concepts without codable children: Secondary | ICD-10-CM | POA: Insufficient documentation

## 2013-06-18 NOTE — Progress Notes (Addendum)
Physical Therapy Treatment Patient Details  Name: [name] MRN: [id number] Date of Birth: [birth year]  Today's Date: 06/18/2013 Time: 1100-1150 PT Time Calculation (min): 50 min Charges: Therex 62min  Visit#: 7 of 10  Re-eval: 06/20/13 Assessment Diagnosis: L knee pain and difficulty walking secondary to arthritis Prior Therapy: no  Authorization: Medicare  Authorization Time Period:    Authorization Visit#: 7 of 10   Subjective: Symptoms/Limitations Symptoms: Patient states feeling better though she was sick last week and was unable to perform much of her HEP. Overall she states she feels weak from being sick but is happy to be up and moving.  How long can you sit comfortably?: 1 hour, limited due to back pain How long can you stand comfortably?: 35min limited by knee pain How long can you walk comfortably?: 61min limited by difficulty breathing.  Repetition: Increases Symptoms Pain Assessment Currently in Pain?: Yes Pain Score: 4  Pain Location: Knee Pain Orientation: Left Pain Type: Chronic pain Pain Onset: More than a month ago Pain Frequency: Constant Pain Relieving Factors: NWB, massage,   Precautions/Restrictions     Exercise/Treatments Standing Side Lunges: 10 reps;Both Other Standing Knee Exercises: 3D hip excursions 10x, 3D hip excursions with OH arm drives 68T, 41D side stepping each, 10x mini squat Other Standing Knee Exercises: Nustep 17min lvl 1 SPM 60-80, 10x marching, 10x standing hamstring curl.  Seated Long Arc Quad: 20 reps;Both;Strengthening (5lb DB) Other Seated Knee Exercises: Thoracic spine excursions, Scapular distraction 10x each   Physical Therapy Assessment and Plan PT Assessment and Plan Clinical Impression Statement: Patient displays improving though still poor weight bearing tolerance especially with any activities requiring knee flexion and loading. Patient displays improving walking tolerance with patient now able to perform 25min  walk test of 384 ft. Patient sit to stand still limited secondary to glut, hamstring, quad weakness and pain (55seconds with Bilateral UE support, 24 seconds with UE support.) Patient and care taker were re-instructed in need to perform NWBing cardiovascular exercise and strengthening exercises at home to build up activity tolerance.  Pt will benefit from skilled therapeutic intervention in order to improve on the following deficits: Decreased activity tolerance;Decreased balance;Decreased mobility;Decreased range of motion;Decreased strength;Difficulty walking;Abnormal gait Rehab Potential: Fair Clinical Impairments Affecting Rehab Potential: COPD, rheumatoid arthritis, insomnia, anemia PT Treatment/Interventions: Gait training;Functional mobility training;Therapeutic activities;Therapeutic exercise;Balance training PT Plan: Perform reassessment and education in HEP     Goals PT Short Term Goals PT Short Term Goal 1:  (Patient able to perform sit to stand 5x with UE support in 23.7 seconds, 54 seconds without UE support. ) PT Short Term Goal 1 - Progress: Progressing toward goal PT Short Term Goal 2 - Progress: Met  Problem List Patient Active Problem List   Diagnosis Date Noted  . Knee pain 05/21/2013  . Fibromyalgia   . COPD (chronic obstructive pulmonary disease)   . Hereditary peripheral neuropathy   . GERD (gastroesophageal reflux disease)   . Rheumatoid arteritis   . Back pain   . Fracture of distal fibula 09/20/2010    PT - End of Session Equipment Utilized During Treatment: Gait belt Activity Tolerance: Patient limited by fatigue;Patient limited by pain General Behavior During Therapy: [facility] for tasks assessed/performed PT Plan of Care PT Home Exercise Plan: Patient will perform walking as tolerated, and perform multip bouts of sit to stand to increase strength and activity toelrance. Also instructed in HEP for hip and back mobility including 3D hip excursions and 3D hip  excursions with  UE reaching.  PT Patient Instructions: Perform mltiple bouts of walking daily.  Consulted and Agree with Plan of Care: Patient;Family member/caregiver Family Member Consulted: aid  GP    Novalee Horsfall R PT DPT 06/18/2013, 12:08 PM

## 2013-06-20 ENCOUNTER — Ambulatory Visit (HOSPITAL_COMMUNITY)
Admission: RE | Admit: 2013-06-20 | Discharge: 2013-06-20 | Disposition: A | Discharge status: Home / Self Care | Payer: Medicare Other | Source: Ambulatory Visit | Attending: Internal Medicine | Admitting: Internal Medicine

## 2013-06-20 DIAGNOSIS — M25569 Pain in unspecified knee: Secondary | ICD-10-CM

## 2013-06-20 NOTE — Evaluation (Signed)
Physical Therapy Discharge  Patient Details  Name: [name] MRN: [id number] Date of Birth: 12/16/1928  Today's Date: 06/20/2013 Time: 1100-1150 PT Time Calculation (min): 50 min  Charges: 1100-1150 theapuetic exercise            Visit#: 8 of 10  Re-eval: 06/20/13 Assessment Diagnosis: L knee pain and difficulty walking secondary to arthritis Prior Therapy: no  Authorization: Medicare    Authorization Visit#: 8 of 8   Past Medical History:  Past Medical History  Diagnosis Date  . Anemia   . Back pain   . COPD (chronic obstructive pulmonary disease)   . Depression   . Fibromyalgia   . Hypercholesteremia   . Hypertension   . Insomnia   . Rheumatoid arteritis   . Cancer     Skin   . Vitamin d deficiency   . Hereditary peripheral neuropathy   . Generalized anxiety disorder   . GERD (gastroesophageal reflux disease)   . Bacterial pneumonia   . Fatty liver   . Internal hemorrhoids   . Diverticulosis   . Ulcer     stomach  . Gastric polyp 09/08/11    at the anastomosis-inflammatory   Past Surgical History:  Past Surgical History  Procedure Laterality Date  . Abdominal surgery      removed patial stomach from ulcer  . Abdominal hysterectomy    . Colonoscopy  04-2001    internal hemorrhoids, panocolonic diverticulosis, and colon polyps   . Upper gastrointestinal endoscopy      Subjective Symptoms/Limitations Symptoms: Patient states she continues to hae a little cold and not feel good though she states her back and knee are feeling better. Patient states that she has been performing her HEP once a day , though no more than that (despite being instructed to do it more). Patient states she will try to perform her HEP more often.  Pertinent History: Patient reports chronic knee and low back pain for several years. Patient and care take states she was referred to PT to be assessed and treated to make sure she was an appropriate canidate for knee replacement and that  she could tolerate the treatment neccesary post surgery. PMH: rheumatoid aarthritis, COPD, hyperthyroidism, hyperlipidemia, gastroesophageal reflux disease, insomnia, anxiety, depression, hysterectom., degernerative disc disease, osteoporosis, anemia. patient is currently utilizing supplemental O2 for walking in the community, in home, and when sleeping, though she does not have it when she reports to therapy.  How long can you sit comfortably?: 1 hour, limited due to back pain How long can you stand comfortably?: limited by knee pain How long can you walk comfortably?: limited by knee pain.  Repetition: Increases Symptoms Pain Assessment Currently in Pain?: Yes Pain Score: 3  Pain Location: Knee Pain Orientation: Left Pain Type: Chronic pain Pain Onset: More than a month ago Pain Frequency: Constant Pain Relieving Factors: NWB Massage, stretching Effect of Pain on Daily Activities: Difficulty walking in home, Balance limited,   Sensation/Coordination/Flexibility/Functional Tests Functional Tests Functional Tests: walk test 452ft Functional Tests: 5x dsit to stand in 24.5 seconds  Assessment LUE Strength Left Elbow Flexion: 4/5 Left Elbow Extension: 4/5 LLE AROM (degrees) Left Knee Extension: 0 Left Knee Flexion: 120 LLE Strength Left Hip Flexion: 4/5 Left Hip Extension: 2/5 Left Hip ABduction: 4/5 Left Knee Flexion: 4/5 Left Knee Extension: 4/5 Left Ankle Dorsiflexion: 4/5  Exercise/Treatments Mobility/Balance  Ambulation/Gait Ambulation/Gait: Yes Ambulation/Gait Assistance: 6: Modified independent (Device/Increase time) Ambulation/Gait Assistance Details: Patient requires cuing for breathing  and to improve gait efficency Ambulation Distance (Feet): 410 Feet Assistive device: 4-wheeled walker Gait Pattern: Decreased trunk rotation;Trunk flexed Gait velocity: slow, limited by knee pain.   Stretches   Aerobic  3x 4 min walking (243ft -  366ft) Standing Forward Step Up: Step Height: 4";Both;5 reps;3 sets (decreased speed and increased difficulty with Lt step up) Other Standing Knee Exercises: 3D hip excursions 10x, 3D hip excursions with OH arm drives 03J, 00X side stepping each, 10x mini squat Other Standing Knee Exercises: 24ft side stepping, 25ft side stepping with green tband around knees, 10x marching, 10x standing hamstring curl.  5x sit to stand, no UE support Seated Long Arc Quad: 20 reps;Both;Strengthening (5lb DB) Other Seated Knee Exercises: Thoracic spine excursions, Scapular distraction 10x each, Rows with greet t-band 10x  Physical Therapy Assessment and Plan PT Assessment and Plan Clinical Impression Statement: Patient displays improved exercise tolerance as indicated by improving 63min walk test and improved LE power and strength as indicated by patient's improved ability to perform 5x sit to stand in <25 seconds not requiring UE support to complete activity. Patient's current esercise tolerance and LE strength and power appear to be limited by several factor  with the primary limiting factor being her knee for which a knee replaceemnt would rovide a likely solution to improve. Other limiting factors include patient's low back pain, limited hip mobility resultign in limited gait mechanics and continued LE weakness. It is this therapist's opinion that assuming patient contineus to perform HEP that includes strengthening exercises and a walking program that the patient will maintain recent gains and be ready for Lt TKA.    Clinical Impairments Affecting Rehab Potential: COPD, rheumatoid arthritis, insomnia, anemia PT Treatment/Interventions: Gait training;Functional mobility training;Therapeutic activities;Therapeutic exercise;Balance training;Patient/family education;Manual techniques PT Plan: Perform reassessment and education in HEP     Goals Home Exercise Program Pt/caregiver will Perform Home Exercise Program:  For increased strengthening;With Supervision, verbal cues required/provided PT Goal: Perform Home Exercise Program - Progress: Met PT Short Term Goals PT Short Term Goal 1: PAtient will be able to perform 5x sit to stand without UE support in <15 seconds. (Patient no longer requires UE support and time improved to 24.3 seconds from >50 seconds. ) PT Short Term Goal 1 - Progress: Partly met PT Short Term Goal 2: patient will be able to walk > 350ft  with O2 >93 throughout to indicate patient is safe for surgery.  PT Short Term Goal 2 - Progress: Met  Problem List Patient Active Problem List   Diagnosis Date Noted  . Knee pain 05/21/2013  . Fibromyalgia   . COPD (chronic obstructive pulmonary disease)   . Hereditary peripheral neuropathy   . GERD (gastroesophageal reflux disease)   . Rheumatoid arteritis   . Back pain   . Fracture of distal fibula 09/20/2010    PT - End of Session Equipment Utilized During Treatment: Gait belt Activity Tolerance: Patient limited by fatigue;Patient limited by pain General Behavior During Therapy: [facility] for tasks assessed/performed PT Plan of Care PT Home Exercise Plan: Patient will perform walking as tolerated, and perform multip bouts of sit to stand to increase strength and activity toelrance. Also instructed in HEP for hip and back mobility including 3D hip excursions and 3D thoracic spine excursions with UE reaching.  PT Patient Instructions: Perform mltiple bouts of walking daily.  Consulted and Agree with Plan of Care: Patient;Family member/caregiver Family Member Consulted: aid  GP Functional Assessment Tool Used: Mobility, Functional assessment, limited gait,  Five time sit to stand, FOTO:  Mobility: Walking and Moving Around Current Status [phone]): At least 60 percent but less than 80 percent impaired, limited or restricted Mobility: Walking and Moving Around Goal Status [phone]): At least 40 percent but less than 60 percent impaired, limited or  restricted Mobility: Walking and Moving Around Discharge Status [phone]): At least 60 percent but less than 80 percent impaired, limited or restricted  Suzette Battiest Kashten Gowin PT DPT 06/20/2013, 12:33 PM  Physician Documentation Your signature is required to indicate approval of the treatment plan as stated above.  Please sign and either send electronically or make a copy of this report for your files and return this physician signed original.   Please mark one 1.__approve of plan  2. ___approve of plan with the following conditions.   ______________________________                                                          _____________________ Physician Signature                                                                                                             Date

## 2013-07-21 ENCOUNTER — Emergency Department (HOSPITAL_COMMUNITY): Payer: Medicare Other

## 2013-07-21 ENCOUNTER — Encounter (HOSPITAL_COMMUNITY): Payer: Self-pay | Admitting: Emergency Medicine

## 2013-07-21 ENCOUNTER — Inpatient Hospital Stay (HOSPITAL_COMMUNITY)
Admission: EM | Admit: 2013-07-21 | Discharge: 2013-07-24 | DRG: 309 | Disposition: A | Discharge status: Home Health | Payer: Medicare Other | Attending: Internal Medicine | Admitting: Internal Medicine

## 2013-07-21 ENCOUNTER — Inpatient Hospital Stay (HOSPITAL_COMMUNITY): Payer: Medicare Other

## 2013-07-21 DIAGNOSIS — IMO0001 Reserved for inherently not codable concepts without codable children: Secondary | ICD-10-CM | POA: Diagnosis present

## 2013-07-21 DIAGNOSIS — E559 Vitamin D deficiency, unspecified: Secondary | ICD-10-CM | POA: Diagnosis present

## 2013-07-21 DIAGNOSIS — K219 Gastro-esophageal reflux disease without esophagitis: Secondary | ICD-10-CM | POA: Diagnosis present

## 2013-07-21 DIAGNOSIS — E7800 Pure hypercholesterolemia, unspecified: Secondary | ICD-10-CM | POA: Diagnosis present

## 2013-07-21 DIAGNOSIS — Z7982 Long term (current) use of aspirin: Secondary | ICD-10-CM

## 2013-07-21 DIAGNOSIS — IMO0002 Reserved for concepts with insufficient information to code with codable children: Secondary | ICD-10-CM

## 2013-07-21 DIAGNOSIS — I4891 Unspecified atrial fibrillation: Principal | ICD-10-CM | POA: Diagnosis present

## 2013-07-21 DIAGNOSIS — D649 Anemia, unspecified: Secondary | ICD-10-CM | POA: Diagnosis present

## 2013-07-21 DIAGNOSIS — F411 Generalized anxiety disorder: Secondary | ICD-10-CM | POA: Diagnosis present

## 2013-07-21 DIAGNOSIS — Z66 Do not resuscitate: Secondary | ICD-10-CM | POA: Diagnosis present

## 2013-07-21 DIAGNOSIS — Z79899 Other long term (current) drug therapy: Secondary | ICD-10-CM

## 2013-07-21 DIAGNOSIS — K7689 Other specified diseases of liver: Secondary | ICD-10-CM | POA: Diagnosis present

## 2013-07-21 DIAGNOSIS — J9611 Chronic respiratory failure with hypoxia: Secondary | ICD-10-CM | POA: Diagnosis present

## 2013-07-21 DIAGNOSIS — E876 Hypokalemia: Secondary | ICD-10-CM | POA: Diagnosis present

## 2013-07-21 DIAGNOSIS — J9610 Chronic respiratory failure, unspecified whether with hypoxia or hypercapnia: Secondary | ICD-10-CM | POA: Diagnosis present

## 2013-07-21 DIAGNOSIS — F329 Major depressive disorder, single episode, unspecified: Secondary | ICD-10-CM | POA: Diagnosis present

## 2013-07-21 DIAGNOSIS — Z8051 Family history of malignant neoplasm of kidney: Secondary | ICD-10-CM

## 2013-07-21 DIAGNOSIS — J4489 Other specified chronic obstructive pulmonary disease: Secondary | ICD-10-CM | POA: Diagnosis present

## 2013-07-21 DIAGNOSIS — R079 Chest pain, unspecified: Secondary | ICD-10-CM | POA: Diagnosis present

## 2013-07-21 DIAGNOSIS — Z8249 Family history of ischemic heart disease and other diseases of the circulatory system: Secondary | ICD-10-CM

## 2013-07-21 DIAGNOSIS — I10 Essential (primary) hypertension: Secondary | ICD-10-CM | POA: Diagnosis present

## 2013-07-21 DIAGNOSIS — M069 Rheumatoid arthritis, unspecified: Secondary | ICD-10-CM | POA: Diagnosis present

## 2013-07-21 DIAGNOSIS — J449 Chronic obstructive pulmonary disease, unspecified: Secondary | ICD-10-CM | POA: Diagnosis present

## 2013-07-21 DIAGNOSIS — M0520 Rheumatoid vasculitis with rheumatoid arthritis of unspecified site: Secondary | ICD-10-CM

## 2013-07-21 DIAGNOSIS — N289 Disorder of kidney and ureter, unspecified: Secondary | ICD-10-CM | POA: Diagnosis present

## 2013-07-21 DIAGNOSIS — F3289 Other specified depressive episodes: Secondary | ICD-10-CM | POA: Diagnosis present

## 2013-07-21 DIAGNOSIS — Z800 Family history of malignant neoplasm of digestive organs: Secondary | ICD-10-CM

## 2013-07-21 LAB — URINALYSIS, ROUTINE W REFLEX MICROSCOPIC
Bilirubin Urine: NEGATIVE
Glucose, UA: NEGATIVE mg/dL
Hgb urine dipstick: NEGATIVE
Ketones, ur: NEGATIVE mg/dL
LEUKOCYTES UA: NEGATIVE
Nitrite: NEGATIVE
PROTEIN: NEGATIVE mg/dL
Specific Gravity, Urine: 1.02 (ref 1.005–1.030)
Urobilinogen, UA: 0.2 mg/dL (ref 0.0–1.0)
pH: 6.5 (ref 5.0–8.0)

## 2013-07-21 LAB — BASIC METABOLIC PANEL
BUN: 12 mg/dL (ref 6–23)
CHLORIDE: 101 meq/L (ref 96–112)
CO2: 30 mEq/L (ref 19–32)
Calcium: 9.7 mg/dL (ref 8.4–10.5)
Creatinine, Ser: 1.03 mg/dL (ref 0.50–1.10)
GFR calc Af Amer: 57 mL/min — ABNORMAL LOW (ref >90)
GFR calc non Af Amer: 49 mL/min — ABNORMAL LOW (ref >90)
GLUCOSE: 140 mg/dL — AB (ref 70–99)
POTASSIUM: 3.5 meq/L — AB (ref 3.7–5.3)
SODIUM: 143 meq/L (ref 137–147)

## 2013-07-21 LAB — PRO B NATRIURETIC PEPTIDE: Pro B Natriuretic peptide (BNP): 458.1 pg/mL — ABNORMAL HIGH (ref 0–450)

## 2013-07-21 LAB — CBC
HCT: 44.6 % (ref 36.0–46.0)
HEMOGLOBIN: 14.3 g/dL (ref 12.0–15.0)
MCH: 31.2 pg (ref 26.0–34.0)
MCHC: 32.1 g/dL (ref 30.0–36.0)
MCV: 97.2 fL (ref 78.0–100.0)
Platelets: 128 10*3/uL — ABNORMAL LOW (ref 150–400)
RBC: 4.59 MIL/uL (ref 3.87–5.11)
RDW: 14.7 % (ref 11.5–15.5)
WBC: 10.3 10*3/uL (ref 4.0–10.5)

## 2013-07-21 LAB — TROPONIN I
Troponin I: <0.3 ng/mL (ref <0.30)
Troponin I: <0.3 ng/mL (ref <0.30)

## 2013-07-21 LAB — MRSA PCR SCREENING: MRSA by PCR: NEGATIVE

## 2013-07-21 LAB — HEMOGLOBIN A1C
HEMOGLOBIN A1C: 6.8 % — AB (ref <5.7)
Mean Plasma Glucose: 148 mg/dL — ABNORMAL HIGH (ref <117)

## 2013-07-21 LAB — MAGNESIUM: MAGNESIUM: 1.7 mg/dL (ref 1.5–2.5)

## 2013-07-21 LAB — TSH: TSH: 2.01 u[IU]/mL (ref 0.350–4.500)

## 2013-07-21 MED ORDER — PREDNISONE 10 MG PO TABS
5.0000 mg | ORAL_TABLET | Freq: Every day | ORAL | Status: DC
  Administered 2013-07-21 – 2013-07-24 (×4): 5 mg via ORAL
  Filled 2013-07-21 (×4): qty 1

## 2013-07-21 MED ORDER — DULOXETINE HCL 60 MG PO CPEP
60.0000 mg | ORAL_CAPSULE | Freq: Every day | ORAL | Status: DC
  Administered 2013-07-21 – 2013-07-24 (×4): 60 mg via ORAL
  Filled 2013-07-21 (×4): qty 1

## 2013-07-21 MED ORDER — PREGABALIN 50 MG PO CAPS
50.0000 mg | ORAL_CAPSULE | Freq: Two times a day (BID) | ORAL | Status: DC
  Administered 2013-07-21 – 2013-07-24 (×6): 50 mg via ORAL
  Filled 2013-07-21: qty 2
  Filled 2013-07-21: qty 1
  Filled 2013-07-21: qty 2
  Filled 2013-07-21: qty 1
  Filled 2013-07-21 (×2): qty 2

## 2013-07-21 MED ORDER — POTASSIUM CHLORIDE CRYS ER 20 MEQ PO TBCR
40.0000 meq | EXTENDED_RELEASE_TABLET | Freq: Once | ORAL | Status: AC
  Administered 2013-07-21: 40 meq via ORAL
  Filled 2013-07-21: qty 2

## 2013-07-21 MED ORDER — SODIUM CHLORIDE 0.9 % IJ SOLN
3.0000 mL | Freq: Two times a day (BID) | INTRAMUSCULAR | Status: DC
  Administered 2013-07-21 – 2013-07-24 (×7): 3 mL via INTRAVENOUS

## 2013-07-21 MED ORDER — DILTIAZEM HCL 100 MG IV SOLR
5.0000 mg/h | INTRAVENOUS | Status: DC
  Administered 2013-07-21: 5 mg/h via INTRAVENOUS
  Filled 2013-07-21: qty 100

## 2013-07-21 MED ORDER — FERROUS GLUCONATE 324 (38 FE) MG PO TABS
324.0000 mg | ORAL_TABLET | Freq: Every day | ORAL | Status: DC
  Administered 2013-07-22 – 2013-07-24 (×3): 324 mg via ORAL
  Filled 2013-07-21 (×6): qty 1

## 2013-07-21 MED ORDER — ENOXAPARIN SODIUM 40 MG/0.4ML ~~LOC~~ SOLN
40.0000 mg | Freq: Every day | SUBCUTANEOUS | Status: DC
  Administered 2013-07-21 – 2013-07-24 (×4): 40 mg via SUBCUTANEOUS
  Filled 2013-07-21 (×5): qty 0.4

## 2013-07-21 MED ORDER — NITROGLYCERIN 0.4 MG SL SUBL: 0.4000 mg | SUBLINGUAL_TABLET | SUBLINGUAL | Status: DC | PRN

## 2013-07-21 MED ORDER — SODIUM CHLORIDE 0.9 % IV SOLN
250.0000 mL | INTRAVENOUS | Status: DC | PRN
  Administered 2013-07-21: 250 mL via INTRAVENOUS

## 2013-07-21 MED ORDER — ALBUTEROL SULFATE (2.5 MG/3ML) 0.083% IN NEBU: 2.5000 mg | INHALATION_SOLUTION | Freq: Four times a day (QID) | RESPIRATORY_TRACT | Status: DC | PRN

## 2013-07-21 MED ORDER — METHOTREXATE 2.5 MG PO TABS
12.5000 mg | ORAL_TABLET | ORAL | Status: DC
  Filled 2013-07-21: qty 5

## 2013-07-21 MED ORDER — ONDANSETRON HCL 4 MG PO TABS
4.0000 mg | ORAL_TABLET | Freq: Four times a day (QID) | ORAL | Status: DC | PRN
Start: 2013-07-21 — End: 2013-07-24

## 2013-07-21 MED ORDER — ACETAMINOPHEN 650 MG RE SUPP: 650.0000 mg | Freq: Four times a day (QID) | RECTAL | Status: DC | PRN

## 2013-07-21 MED ORDER — MOMETASONE FURO-FORMOTEROL FUM 100-5 MCG/ACT IN AERO
2.0000 | INHALATION_SPRAY | Freq: Two times a day (BID) | RESPIRATORY_TRACT | Status: DC
  Filled 2013-07-21: qty 8.8

## 2013-07-21 MED ORDER — FUROSEMIDE 10 MG/ML IJ SOLN
20.0000 mg | Freq: Two times a day (BID) | INTRAMUSCULAR | Status: DC
  Administered 2013-07-21 – 2013-07-22 (×3): 20 mg via INTRAVENOUS
  Filled 2013-07-21 (×3): qty 2

## 2013-07-21 MED ORDER — FOLIC ACID 1 MG PO TABS
1.0000 mg | ORAL_TABLET | Freq: Every day | ORAL | Status: DC
  Administered 2013-07-21 – 2013-07-24 (×4): 1 mg via ORAL
  Filled 2013-07-21 (×4): qty 1

## 2013-07-21 MED ORDER — LOSARTAN POTASSIUM 50 MG PO TABS
25.0000 mg | ORAL_TABLET | Freq: Every day | ORAL | Status: DC
  Administered 2013-07-21 – 2013-07-24 (×4): 25 mg via ORAL
  Filled 2013-07-21 (×4): qty 1

## 2013-07-21 MED ORDER — PANTOPRAZOLE SODIUM 40 MG PO TBEC
40.0000 mg | DELAYED_RELEASE_TABLET | Freq: Two times a day (BID) | ORAL | Status: DC
  Administered 2013-07-21 – 2013-07-24 (×7): 40 mg via ORAL
  Filled 2013-07-21 (×7): qty 1

## 2013-07-21 MED ORDER — LORAZEPAM 0.5 MG PO TABS
0.5000 mg | ORAL_TABLET | Freq: Four times a day (QID) | ORAL | Status: DC | PRN
  Administered 2013-07-21 – 2013-07-23 (×3): 0.5 mg via ORAL
  Filled 2013-07-21 (×3): qty 1

## 2013-07-21 MED ORDER — ONDANSETRON HCL 4 MG/2ML IJ SOLN: 4.0000 mg | Freq: Four times a day (QID) | INTRAMUSCULAR | Status: DC | PRN

## 2013-07-21 MED ORDER — ACETAMINOPHEN 325 MG PO TABS
650.0000 mg | ORAL_TABLET | Freq: Four times a day (QID) | ORAL | Status: DC | PRN
  Administered 2013-07-21: 650 mg via ORAL
  Filled 2013-07-21: qty 2

## 2013-07-21 MED ORDER — ASPIRIN EC 325 MG PO TBEC
325.0000 mg | DELAYED_RELEASE_TABLET | Freq: Every day | ORAL | Status: DC
  Administered 2013-07-21 – 2013-07-24 (×4): 325 mg via ORAL
  Filled 2013-07-21 (×4): qty 1

## 2013-07-21 MED ORDER — VITAMIN B-12 100 MCG PO TABS
100.0000 ug | ORAL_TABLET | Freq: Every day | ORAL | Status: DC
  Administered 2013-07-22 – 2013-07-24 (×3): 100 ug via ORAL
  Filled 2013-07-21 (×6): qty 1

## 2013-07-21 MED ORDER — MOMETASONE FURO-FORMOTEROL FUM 100-5 MCG/ACT IN AERO
2.0000 | INHALATION_SPRAY | Freq: Two times a day (BID) | RESPIRATORY_TRACT | Status: DC
  Administered 2013-07-21 – 2013-07-24 (×6): 2 via RESPIRATORY_TRACT
  Filled 2013-07-21: qty 8.8

## 2013-07-21 MED ORDER — DILTIAZEM HCL 30 MG PO TABS
30.0000 mg | ORAL_TABLET | Freq: Four times a day (QID) | ORAL | Status: DC
  Administered 2013-07-21 – 2013-07-22 (×4): 30 mg via ORAL
  Filled 2013-07-21 (×4): qty 1

## 2013-07-21 MED ORDER — SODIUM CHLORIDE 0.9 % IJ SOLN: 3.0000 mL | INTRAMUSCULAR | Status: DC | PRN

## 2013-07-21 MED ORDER — IOHEXOL 350 MG/ML SOLN: 100.0000 mL | Freq: Once | INTRAVENOUS | Status: AC | PRN

## 2013-07-21 NOTE — H&P (Addendum)
[facility] History and Physical  [name] [id number] DOB: 19-Aug-1928 DOA: 07/21/2013  Referring physician: Dr. Vanita Panda PCP: Delphina Cahill, MD   Chief Complaint: chest pain  HPI: [name] is a 80 y.o. female with multiple medical problems who presents to the emergency room with complaints of chest pain. Patient is a poor historian therefore history is somewhat limited. She reports waking up this morning around 5:30 with complaints of substernal chest pain. She feels it may be radiating to her neck. She feels is worse with deep breathing. She may have some shortness of breath, although she is not sure. She chronically wears oxygen during the night. She has a chronic cough. Denies any fever. She didn't feel nauseous this morning but did not have any vomiting. Her discomfort has been present since this morning. She reports having a similar episode approximately one week ago which self resolved. She was evaluated in the emergency room and noted to be in rapid atrial fibrillation. Chest x-ray indicated possible pulmonary edema, BNP was mildly elevated. She's been referred for admission   Review of Systems:  Pertinent positives as per HPI, otherwise negative  Past Medical History  Diagnosis Date  . Anemia   . Back pain   . COPD (chronic obstructive pulmonary disease)   . Depression   . Fibromyalgia   . Hypercholesteremia   . Hypertension   . Insomnia   . Rheumatoid arteritis   . Cancer     Skin   . Vitamin D deficiency   . 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 Generalized anxiety disorder   . GERD (gastroesophageal reflux disease)   . Bacterial pneumonia   . Fatty liver   . Internal hemorrhoids   . Diverticulosis   . Ulcer     stomach  . Gastric polyp 09/08/11    at the anastomosis-inflammatory   Past Surgical History  Procedure Laterality Date  . Abdominal surgery      removed patial stomach from ulcer  . Abdominal hysterectomy    .  Colonoscopy  04-2001    internal hemorrhoids, panocolonic diverticulosis, and colon polyps   . Upper gastrointestinal endoscopy     Social History:  reports that she has never smoked. She has never used smokeless tobacco. She reports that she does not drink alcohol or use illicit drugs.  Allergies  Allergen Reactions  . Other     Band Aids   . Tape   . Lipitor [Atorvastatin Calcium] Rash and Other (See Comments)    Muscle pain  . Niaspan [Niacin Er] Rash and Other (See Comments)    Muscle pain    Family History  Problem Relation Age of Onset  . Colon cancer Daughter 1  . Heart disease Father   . Kidney disease Mother     kidney cancer     Prior to Admission medications   Medication Sig Start Date End Date Taking? Authorizing Provider  albuterol (PROAIR HFA) 108 (90 BASE) MCG/ACT inhaler Inhale 2 puffs into the lungs every 6 (six) hours as needed. For rescue   Yes Historical Provider, MD  aspirin 81 MG tablet Take 81 mg by mouth daily.    Yes Historical Provider, MD  Cholecalciferol (VITAMIN D3) 1000 UNITS CAPS Take 1 capsule by mouth daily.   Yes Historical Provider, MD  cyanocobalamin 100 MCG tablet Take 100 mcg by mouth daily.   Yes Historical Provider, MD  DULoxetine (CYMBALTA) 60 MG capsule Take 60 mg by mouth daily.  Yes Historical Provider, MD  ferrous gluconate (FERGON) 325 MG tablet Take 325 mg by mouth daily.    Yes Historical Provider, MD  Fluticasone-Salmeterol (ADVAIR DISKUS) 250-50 MCG/DOSE AEPB Inhale 1 puff into the lungs every 12 (twelve) hours.   Yes Historical Provider, MD  folic acid (FOLVITE) 1 MG tablet Take 1 mg by mouth daily.   Yes Historical Provider, MD  LORazepam (ATIVAN) 0.5 MG tablet Take 0.5 mg by mouth every 6 (six) hours as needed for anxiety.    Yes Historical Provider, MD  losartan (COZAAR) 25 MG tablet Take 25 mg by mouth daily. 06/03/13  Yes Historical Provider, MD  methotrexate (RHEUMATREX) 2.5 MG tablet Take 12.5 mg by mouth once a week.  *Taken on Thursdays at 1200Caution:Chemotherapy. Protect from light.   Yes Historical Provider, MD  oxyCODONE-acetaminophen (PERCOCET) 7.5-325 MG per tablet Take 1 tablet by mouth 4 (four) times daily as needed. 06/18/13  Yes Historical Provider, MD  pantoprazole (PROTONIX) 40 MG tablet Take 40 mg by mouth 2 (two) times daily.    Yes Historical Provider, MD  polyethylene glycol (MIRALAX / GLYCOLAX) packet Take 17 g by mouth as directed.   Yes Historical Provider, MD  predniSONE (DELTASONE) 5 MG tablet Take 5 mg by mouth daily.     Yes Historical Provider, MD  pregabalin (LYRICA) 50 MG capsule Take 50 mg by mouth 2 (two) times daily.    Yes Historical Provider, MD  Probiotic Product (PROBIOTIC FORMULA PO) Take by mouth.   Yes Historical Provider, MD  raloxifene (EVISTA) 60 MG tablet Take 60 mg by mouth daily.   Yes Historical Provider, MD  rosuvastatin (CRESTOR) 20 MG tablet Take 20 mg by mouth daily.    Yes Historical Provider, MD  vitamin C (ASCORBIC ACID) 500 MG tablet Take 500 mg by mouth daily.   Yes Historical Provider, MD   Physical Exam: Filed Vitals:   07/21/13 1145  BP: 124/66  Pulse: 110  Temp:   Resp:     BP 124/66  Pulse 110  Temp(Src) 98.1 F (36.7 C) (Oral)  Resp 16  Ht 5' (1.524 m)  Wt 98.1 kg (216 lb 4.3 oz)  BMI 42.24 kg/m2  SpO2 98%  General:  Laying in bed, no acute distress, appears anxious Eyes: PERRL, normal lids, irises & conjunctiva ENT: grossly normal hearing, lips & tongue Neck: no LAD, masses or thyromegaly Cardiovascular: S1, S2 irregular, no m/r/g. No LE edema. Respiratory: CTA bilaterally, no w/r/r. Normal respiratory effort. Abdomen: soft, ntnd Skin: no rash or induration seen on limited exam Musculoskeletal: grossly normal tone BUE/BLE Psychiatric: grossly normal mood and affect, speech fluent and appropriate Neurologic: grossly non-focal.          Labs on Admission:  Basic Metabolic Panel:  Recent Labs Lab 07/21/13 0805  NA 143  K 3.5*   CL 101  CO2 30  GLUCOSE 140*  BUN 12  CREATININE 1.03  CALCIUM 9.7  MG 1.7   Liver Function Tests: No results found for this basename: AST, ALT, ALKPHOS, BILITOT, PROT, ALBUMIN,  in the last 168 hours No results found for this basename: LIPASE, AMYLASE,  in the last 168 hours No results found for this basename: AMMONIA,  in the last 168 hours CBC:  Recent Labs Lab 07/21/13 0805  WBC 10.3  HGB 14.3  HCT 44.6  MCV 97.2  PLT 128*   Cardiac Enzymes:  Recent Labs Lab 07/21/13 0805  TROPONINI <0.30    BNP (last 3 results)  Recent Labs  07/21/13 0805  PROBNP 458.1*   CBG: No results found for this basename: GLUCAP,  in the last 168 hours  Radiological Exams on Admission: Dg Chest Port 1 View  07/21/2013   CLINICAL DATA:  Chest pain weakness  EXAM: PORTABLE CHEST - 1 VIEW  COMPARISON:  DG CHEST 2 VIEW dated 10/16/2011; DG CHEST 2 VIEW dated 05/18/2011; CT ABDOMEN W/CM dated 01/07/2011  FINDINGS: Moderate cardiac enlargement. Mild vascular congestion. Mild perihilar peribronchial wall thickening and mild interstitial prominence.  IMPRESSION: Cardiac enlargement and vascular congestion. Suspect mild interstitial pulmonary edema.   Electronically Signed   By: Skipper Cliche M.D.   On: 07/21/2013 08:40    EKG: Independently reviewed. Rapid atrial fibrillation  Assessment/Plan Principal Problem:   Atrial fibrillation with rapid ventricular response Active Problems:   COPD (chronic obstructive pulmonary disease)   GERD (gastroesophageal reflux disease)   Chest pain   Chronic respiratory failure   Hypokalemia   Atrial fibrillation with RVR   1. Chest pain. Possibly related to rapid atrial fibrillation. With acute onset of symptoms and pleuritic nature of the pain, we'll pursue CT angiogram chest to rule out any underlying pulmonary embolus. We'll cycle cardiac markers. Check echocardiogram. Continue on aspirin. If pain persists, she may need cardiology consult to pursue  further workup. She does not report any prior cardiac workup in the past. 2. Rapid atrial fibrillation. Currently on Cardizem drip. Will check to transition to oral Cardizem once heart rate is better controlled. 3. possible pulmonary edema. Patient had evidence of mild pulmonary edema on her chest x-ray. Her shortness of breath and chest pain are likely more related to her rapid atrial fibrillation. BNP is only mildly elevated in the 400s. We'll start low-dose Lasix and observe.  4. COPD. Appears stable. No evidence of wheezing. 5. GERD. Continue proton pump inhibitors. 6. Hypokalemia. Replace 7. History of rheumatoid arthritis. Continue prednisone and methotrexate.   Code Status: DNR Family Communication: Discussed with daughter [name] over the phone.  She confirmed patient's code status. Disposition Plan: pending hospital course  Time spent: 84mins  Mycah Mcdougall [facility] Pager [phone]

## 2013-07-21 NOTE — Progress Notes (Addendum)
DR MEMON NOTIFIED OF IV INFILTRATION OF CT CONTRAST MEDIA, AND INABILITY TO COMPLETE CHEST CT. PT'S AFFECTED LT ARM ELEVATED AND ICE PACK APPLIED. [name] [place] ALSO NOTIFIED . NO NEW ORDERS .

## 2013-07-21 NOTE — ED Provider Notes (Signed)
CSN: [id number]     Arrival date & time 07/21/13  0737 History  This chart was scribed for Carmin Muskrat, MD by Zettie Pho, ED Scribe. This patient was seen in room APA02/APA02 and the patient's care was started at 7:56 AM.    Chief Complaint  Patient presents with  . Chest Pain   The history is provided by the patient. No language interpreter was used.   HPI Comments: LATICE SHELLENBERGER is a 80 y.o. Female with a history of COPD, HTN, hypercholesterolemia brought in by EMS who presents to the Emergency Department complaining of a constant pain to the substernal region of the chest with associated shortness of breath onset just PTA. Patient is unable to specify the onset of her symptoms. She reports some increased leg swelling. EMS administered 324 mg of aspirin and 0.4 mg nitroglycerin en route with some relief. She denies any symptoms at this time. She denies any recent changes in medications. She denies nausea, weakness. Patient has allergies to Lipitor and Niaspan. Patient also has a history of fibromyalgia, rheumatoid arthritis, GERD, pneumonia, fatty liver, stomach ulcer, gastric polyp.   Past Medical History  Diagnosis Date  . Anemia   . Back pain   . COPD (chronic obstructive pulmonary disease)   . Depression   . Fibromyalgia   . Hypercholesteremia   . Hypertension   . Insomnia   . Rheumatoid arteritis   . Cancer     Skin   . Vitamin d deficiency   . Hereditary peripheral neuropathy   . Generalized anxiety disorder   . GERD (gastroesophageal reflux disease)   . Bacterial pneumonia   . Fatty liver   . Internal hemorrhoids   . Diverticulosis   . Ulcer     stomach  . Gastric polyp 09/08/11    at the anastomosis-inflammatory   Past Surgical History  Procedure Laterality Date  . Abdominal surgery      removed patial stomach from ulcer  . Abdominal hysterectomy    . Colonoscopy  04-2001    internal hemorrhoids, panocolonic diverticulosis, and colon polyps   . Upper  gastrointestinal endoscopy     Family History  Problem Relation Age of Onset  . Colon cancer Daughter 20  . Heart disease Father   . Kidney disease Mother     kidney cancer   History  Substance Use Topics  . Smoking status: Never Smoker   . Smokeless tobacco: Never Used  . Alcohol Use: No   OB History   Grav Para Term Preterm Abortions TAB SAB Ect Mult Living                 Review of Systems  Constitutional:       Per HPI, otherwise negative  HENT:       Per HPI, otherwise negative  Respiratory: Positive for shortness of breath.        Per HPI, otherwise negative  Cardiovascular: Positive for chest pain and leg swelling.       Per HPI, otherwise negative  Gastrointestinal: Negative for vomiting.  Endocrine:       Negative aside from HPI  Genitourinary:       Neg aside from HPI   Musculoskeletal:       Per HPI, otherwise negative  Skin: Negative.   Neurological: Negative for syncope and weakness.      Allergies  Other; Tape; Lipitor; and Niaspan  Home Medications   Prior to Admission medications   Medication Sig  Start Date End Date Taking? Authorizing Provider  albuterol (PROAIR HFA) 108 (90 BASE) MCG/ACT inhaler Inhale 2 puffs into the lungs every 6 (six) hours as needed. For rescue    Historical Provider, MD  aspirin 81 MG tablet Take 81 mg by mouth daily.     Historical Provider, MD  Cholecalciferol (VITAMIN D3) 1000 UNITS CAPS Take 1 capsule by mouth daily.    Historical Provider, MD  cyanocobalamin 100 MCG tablet Take 100 mcg by mouth daily.    Historical Provider, MD  DULoxetine (CYMBALTA) 60 MG capsule Take 60 mg by mouth daily.      Historical Provider, MD  ferrous gluconate (FERGON) 325 MG tablet Take 325 mg by mouth daily.     Historical Provider, MD  Fluticasone-Salmeterol (ADVAIR DISKUS) 250-50 MCG/DOSE AEPB Inhale 1 puff into the lungs every 12 (twelve) hours.    Historical Provider, MD  folic acid (FOLVITE) 1 MG tablet Take 1 mg by mouth daily.     Historical Provider, MD  HYDROcodone-acetaminophen (VICODIN) 5-500 MG per tablet Take 1 tablet by mouth every 6 (six) hours as needed.      Historical Provider, MD  LORazepam (ATIVAN) 0.5 MG tablet Take 0.5 mg by mouth every 8 (eight) hours as needed.     Historical Provider, MD  methotrexate (RHEUMATREX) 2.5 MG tablet Take 12.5 mg by mouth once a week. *Taken on Thursdays at 1200Caution:Chemotherapy. Protect from light.    Historical Provider, MD  nebivolol (BYSTOLIC) 5 MG tablet Take 5 mg by mouth daily.      Historical Provider, MD  pantoprazole (PROTONIX) 40 MG tablet Take 40 mg by mouth 2 (two) times daily.     Historical Provider, MD  polyethylene glycol (MIRALAX / GLYCOLAX) packet Take 17 g by mouth as directed.    Historical Provider, MD  predniSONE (DELTASONE) 5 MG tablet Take 5 mg by mouth daily.      Historical Provider, MD  pregabalin (LYRICA) 50 MG capsule Take 50 mg by mouth 2 (two) times daily.     Historical Provider, MD  Probiotic Product (PROBIOTIC FORMULA PO) Take by mouth.    Historical Provider, MD  raloxifene (EVISTA) 60 MG tablet Take 60 mg by mouth daily.    Historical Provider, MD  rosuvastatin (CRESTOR) 20 MG tablet Take 20 mg by mouth daily.     Historical Provider, MD  sucralfate (CARAFATE) 1 G tablet Take 1 tablet (1 g total) by mouth 4 (four) times daily. 11/23/11   Gatha Mayer, MD  traZODone (DESYREL) 50 MG tablet Take 50 mg by mouth at bedtime.    Historical Provider, MD  vitamin C (ASCORBIC ACID) 500 MG tablet Take 500 mg by mouth daily.    Historical Provider, MD   Triage Vitals: BP 147/80  Pulse 130  Temp(Src) 98.1 F (36.7 C) (Oral)  Resp 24  Ht 5\' 2"  (1.575 m)  Wt 230 lb (104.327 kg)  BMI 42.06 kg/m2  SpO2 94%  Physical Exam  Nursing note and vitals reviewed. Constitutional: She is oriented to person, place, and time. She appears well-developed and well-nourished. No distress.  HENT:  Head: Normocephalic and atraumatic.  Eyes: Conjunctivae and EOM  are normal.  Cardiovascular: An irregularly irregular rhythm present. Tachycardia present.   Tachycardic. Irregularly irregular.   Pulmonary/Chest: Effort normal and breath sounds normal. No stridor. No respiratory distress.  Abdominal: She exhibits no distension.  Musculoskeletal: She exhibits edema.  Bilateral lower extremity edema, non-pitting.   Neurological: She is alert  and oriented to person, place, and time. No cranial nerve deficit.  Skin: Skin is warm and dry.  Psychiatric: She has a normal mood and affect.    ED Course  Procedures (including critical care time)  DIAGNOSTIC STUDIES: Oxygen Saturation is 94% on nasal cannulation, adequate by my interpretation.    COORDINATION OF CARE: 7:59 AM- Ordered EKG, chest x-ray, CBC, BMP, BNP, TSH, magnesium, and troponin. Discussed treatment plan with patient at bedside and patient verbalized agreement.   8:02 AM- Reviewed EMS rhythym strip - afib/rvr rate 124, abnormal   Labs Review Labs Reviewed  CBC - Abnormal; Notable for the following:    Platelets 128 (*)    All other components within normal limits  BASIC METABOLIC PANEL - Abnormal; Notable for the following:    Potassium 3.5 (*)    Glucose, Bld 140 (*)    GFR calc non Af Amer 49 (*)    GFR calc Af Amer 57 (*)    All other components within normal limits  PRO B NATRIURETIC PEPTIDE - Abnormal; Notable for the following:    Pro B Natriuretic peptide (BNP) 458.1 (*)    All other components within normal limits  TROPONIN I  MAGNESIUM  TSH    Imaging Review Dg Chest Port 1 View  07/21/2013   CLINICAL DATA:  Chest pain weakness  EXAM: PORTABLE CHEST - 1 VIEW  COMPARISON:  DG CHEST 2 VIEW dated 10/16/2011; DG CHEST 2 VIEW dated 05/18/2011; CT ABDOMEN W/CM dated 01/07/2011  FINDINGS: Moderate cardiac enlargement. Mild vascular congestion. Mild perihilar peribronchial wall thickening and mild interstitial prominence.  IMPRESSION: Cardiac enlargement and vascular congestion.  Suspect mild interstitial pulmonary edema.   Electronically Signed   By: Skipper Cliche M.D.   On: 07/21/2013 08:40     EKG Interpretation   Date/Time:  Sunday Jul 21 2013 07:46:37 EDT Ventricular Rate:  129 PR Interval:  149 QRS Duration: 83 QT Interval:  319 QTC Calculation: 467 R Axis:   -9 Text Interpretation:  Sinus tachycardia with irregular rate Low voltage,  precordial leads Baseline wander in lead(s) V4 Atrial fibrillation with  rapid ventricular response Abnormal ekg Confirmed by Carmin Muskrat  MD  ([id number]) on 07/21/2013 8:06:26 AM       Update: Patient remains tachycardic. I reviewed the XR - pulm congestion.   10:08 AM HR 105 Patient appears calm.    MDM   I personally performed the services described in this documentation, which was scribed in my presence. The recorded information has been reviewed and is accurate.  Patient presents with chest pain, dyspnea. Patient is a multiple medical problems, but no history of afib.  Patient's initial evaluation demonstrates atrial fibrillation with rapid ventricular response, and x-ray that pulmonary congestion. Patient's blood pressure remained stable during initiation of diltiazem drip. Following initiation of the trip patient's heart rate diminished substantially and she remained calm. Patient's EKG, and a troponin does not suggest ongoing coronary ischemia concurrently. Patient required admission to the step down unit given the new dysrhythmia, active Cardizem drip.   CRITICAL CARE Performed by: Carmin Muskrat Total critical care time: 35 Critical care time was exclusive of separately billable procedures and treating other patients. Critical care was necessary to treat or prevent imminent or life-threatening deterioration. Critical care was time spent personally by me on the following activities: development of treatment plan with patient and/or surrogate as well as nursing, discussions with consultants,  evaluation of patient's response to treatment, examination of patient, obtaining  history from patient or surrogate, ordering and performing treatments and interventions, ordering and review of laboratory studies, ordering and review of radiographic studies, pulse oximetry and re-evaluation of patient's condition.     Carmin Muskrat, MD 07/21/13 1011

## 2013-07-21 NOTE — ED Notes (Signed)
Central-mid cp starting this morning with sob.  Per EMS - given ASA 324mg  and nitro 0.4mg  SL x 1 in route with some relief.

## 2013-07-22 ENCOUNTER — Inpatient Hospital Stay (HOSPITAL_COMMUNITY): Payer: Medicare Other

## 2013-07-22 DIAGNOSIS — I517 Cardiomegaly: Secondary | ICD-10-CM

## 2013-07-22 DIAGNOSIS — I7789 Other specified disorders of arteries and arterioles: Secondary | ICD-10-CM

## 2013-07-22 LAB — CBC
HEMATOCRIT: 44.7 % (ref 36.0–46.0)
HEMOGLOBIN: 14.5 g/dL (ref 12.0–15.0)
MCH: 31.3 pg (ref 26.0–34.0)
MCHC: 32.4 g/dL (ref 30.0–36.0)
MCV: 96.5 fL (ref 78.0–100.0)
Platelets: 148 10*3/uL — ABNORMAL LOW (ref 150–400)
RBC: 4.63 MIL/uL (ref 3.87–5.11)
RDW: 14.9 % (ref 11.5–15.5)
WBC: 11.2 10*3/uL — ABNORMAL HIGH (ref 4.0–10.5)

## 2013-07-22 LAB — COMPREHENSIVE METABOLIC PANEL
ALT: 26 U/L (ref 0–35)
AST: 27 U/L (ref 0–37)
Albumin: 3.2 g/dL — ABNORMAL LOW (ref 3.5–5.2)
Alkaline Phosphatase: 59 U/L (ref 39–117)
BUN: 15 mg/dL (ref 6–23)
CALCIUM: 10.1 mg/dL (ref 8.4–10.5)
CO2: 30 mEq/L (ref 19–32)
Chloride: 102 mEq/L (ref 96–112)
Creatinine, Ser: 1.01 mg/dL (ref 0.50–1.10)
GFR calc Af Amer: 58 mL/min — ABNORMAL LOW (ref >90)
GFR calc non Af Amer: 50 mL/min — ABNORMAL LOW (ref >90)
Glucose, Bld: 156 mg/dL — ABNORMAL HIGH (ref 70–99)
Potassium: 3.6 mEq/L — ABNORMAL LOW (ref 3.7–5.3)
Sodium: 144 mEq/L (ref 137–147)
TOTAL PROTEIN: 6.4 g/dL (ref 6.0–8.3)
Total Bilirubin: 1.1 mg/dL (ref 0.3–1.2)

## 2013-07-22 LAB — TROPONIN I

## 2013-07-22 MED ORDER — DILTIAZEM HCL 60 MG PO TABS
60.0000 mg | ORAL_TABLET | Freq: Four times a day (QID) | ORAL | Status: DC
  Administered 2013-07-22 – 2013-07-23 (×3): 60 mg via ORAL
  Filled 2013-07-22 (×3): qty 1

## 2013-07-22 MED ORDER — IOHEXOL 350 MG/ML SOLN
100.0000 mL | Freq: Once | INTRAVENOUS | Status: AC | PRN
  Administered 2013-07-22: 100 mL via INTRAVENOUS

## 2013-07-22 MED ORDER — POTASSIUM CHLORIDE CRYS ER 20 MEQ PO TBCR
40.0000 meq | EXTENDED_RELEASE_TABLET | Freq: Once | ORAL | Status: AC
  Administered 2013-07-22: 40 meq via ORAL
  Filled 2013-07-22: qty 2

## 2013-07-22 MED ORDER — SODIUM CHLORIDE 0.9 % IJ SOLN
INTRAMUSCULAR | Status: AC
  Filled 2013-07-22: qty 500

## 2013-07-22 NOTE — Consult Note (Signed)
Consulting cardiologist:Debbera Wolken MD  Clinical Summary Ms. Callery is a 80 y.o.female with HTN, HL, fibromylagia, ulcer based on EGD 2012, admitted with chest pain and SOB. Found to be in Afib with RVR in ER. This is a new diagnosis for this patient.  Started on cardizem drip with improvement in rates.    EKG afib rate 130, CXR with mild pulm congestion, CT PE negative for PE. TSH 2, Mg 1.7, K 3.5,  Trop neg, BNP 458, Plt 128, Hgb 14.3.  Allergies  Allergen Reactions  . Other     Band Aids   . Tape   . Lipitor [Atorvastatin Calcium] Rash and Other (See Comments)    Muscle pain  . Niaspan [Niacin Er] Rash and Other (See Comments)    Muscle pain    Medications Scheduled Medications: . aspirin EC  325 mg Oral Daily  . diltiazem  60 mg Oral 4 times per day  . DULoxetine  60 mg Oral Daily  . enoxaparin (LOVENOX) injection  40 mg Subcutaneous Daily  . ferrous gluconate  324 mg Oral Daily  . folic acid  1 mg Oral Daily  . losartan  25 mg Oral Daily  . [START ON 07/25/2013] methotrexate  12.5 mg Oral Weekly  . mometasone-formoterol  2 puff Inhalation BID  . pantoprazole  40 mg Oral BID  . potassium chloride  40 mEq Oral Once  . predniSONE  5 mg Oral Daily  . pregabalin  50 mg Oral BID  . sodium chloride  3 mL Intravenous Q12H  . sodium chloride      . cyanocobalamin  100 mcg Oral Daily     Infusions: . diltiazem (CARDIZEM) infusion Stopped (07/21/13 1700)     PRN Medications:  sodium chloride, acetaminophen, acetaminophen, albuterol, LORazepam, nitroGLYCERIN, ondansetron (ZOFRAN) IV, ondansetron, sodium chloride   Past Medical History  Diagnosis Date  . Anemia   . Back pain   . COPD (chronic obstructive pulmonary disease)   . Depression   . Fibromyalgia   . Hypercholesteremia   . Hypertension   . Insomnia   . Rheumatoid arteritis   . Cancer     Skin   . Vitamin D deficiency   . 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 Generalized anxiety disorder    . GERD (gastroesophageal reflux disease)   . Bacterial pneumonia   . Fatty liver   . Internal hemorrhoids   . Diverticulosis   . Ulcer     stomach  . Gastric polyp 09/08/11    at the anastomosis-inflammatory    Past Surgical History  Procedure Laterality Date  . Abdominal surgery      removed patial stomach from ulcer  . Abdominal hysterectomy    . Colonoscopy  04-2001    internal hemorrhoids, panocolonic diverticulosis, and colon polyps   . Upper gastrointestinal endoscopy      Family History  Problem Relation Age of Onset  . Colon cancer Daughter 27  . Heart disease Father   . Kidney disease Mother     kidney cancer    Social History Ms. Scully reports that she has never smoked. She has never used smokeless tobacco. Ms. Degennaro reports that she does not drink alcohol.  Review of Systems CONSTITUTIONAL: No weight loss, fever, chills, weakness or fatigue.  HEENT: Eyes: No visual loss, blurred vision, double vision or yellow sclerae. No hearing loss, sneezing, congestion, runny nose or sore throat.  SKIN: No rash or itching.  CARDIOVASCULAR: per HPI RESPIRATORY:  No shortness of breath, cough or sputum.  GASTROINTESTINAL: No anorexia, nausea, vomiting or diarrhea. No abdominal pain or blood.  GENITOURINARY: no polyuria, no dysuria NEUROLOGICAL: No headache, dizziness, syncope, paralysis, ataxia, numbness or tingling in the extremities. No change in bowel or bladder control.  MUSCULOSKELETAL: No muscle, back pain, joint pain or stiffness.  HEMATOLOGIC: No anemia, bleeding or bruising.  LYMPHATICS: No enlarged nodes. No history of splenectomy.  PSYCHIATRIC: No history of depression or anxiety.      Physical Examination Blood pressure 175/105, pulse 96, temperature 98.1 F (36.7 C), temperature source Oral, resp. rate 11, height 5' (1.524 m), weight 222 lb 0.1 oz (100.7 kg), SpO2 95.00%.  Intake/Output Summary (Last 24 hours) at 07/22/13 1420 Last data filed at  07/22/13 1057  Gross per 24 hour  Intake    480 ml  Output   3750 ml  Net  -3270 ml    Cardiovascular: irreg, 2/6 systolic murmur LLSB, no JVD  Respiratory: clear anteriorally  GI: abdomen soft, NT, ND  MSK; no LE edema  Neuro: no focal deficits  Psych: appropriate affect   Lab Results  Basic Metabolic Panel:  Recent Labs Lab 07/21/13 0805 07/22/13 0512  NA 143 144  K 3.5* 3.6*  CL 101 102  CO2 30 30  GLUCOSE 140* 156*  BUN 12 15  CREATININE 1.03 1.01  CALCIUM 9.7 10.1  MG 1.7  --     Liver Function Tests:  Recent Labs Lab 07/22/13 0512  AST 27  ALT 26  ALKPHOS 59  BILITOT 1.1  PROT 6.4  ALBUMIN 3.2*    CBC:  Recent Labs Lab 07/21/13 0805 07/22/13 0512  WBC 10.3 11.2*  HGB 14.3 14.5  HCT 44.6 44.7  MCV 97.2 96.5  PLT 128* 148*    Cardiac Enzymes:  Recent Labs Lab 07/21/13 0805 07/21/13 1243 07/21/13 1837 07/21/13 2358  TROPONINI <0.30 <0.30 <0.30 <0.30    BNP: No components found with this basename: POCBNP,    ECG Afib rate 130   Impression/Recommendations 1. Afib - new diagnosis for patient, started on dilt gtt and now transitioned to oral dilt.  - CHADS2Vasc score is 4 consistent with 4% annual risk of CVA - discussed risks and benefits of anticoag. Patient reports she often feels dizzy and stumbles with her gait, she has had at least 4 fairly significant falls over the last 4 years. Discussed with patient and daugthers increased risk of bleeding given falls and fall risk. Will continue ASA for now, readdress with follow up. Currently they are undecided on anticoag.  2. Chest pain - atypical, occurred in the setting of afib with RVR. Now resolved, no evidence of ACS - follow up echo, do not see indication for ischemic eval at this point.     Carlyle Dolly, M.D., F.A.C.C.

## 2013-07-22 NOTE — Progress Notes (Signed)
  Echocardiogram 2D Echocardiogram has been performed.  Su Grand Archer Moist 07/22/2013, 3:52 PM

## 2013-07-22 NOTE — Progress Notes (Signed)
[facility] PROGRESS NOTE  [name] [id number] DOB: [birth year] DOA: 07/21/2013 PCP: Delphina Cahill, MD  Assessment/Plan: 1. Chest pain, she has ruled out for ACS with negative cardiac markers.  CT angio chest is negative for PE.  I suspect this may have been related to her rapid atrial fib vs. Musculoskeletal. The patient has not had any cardiac work up in the past.  With her new onset Atrial fib, will ask cardiology to see if further work up is needed. 2. Rapid atrial fib. Heart rate appears to have improved. She has been weaned off the cardizem infusion.  Now on oral cardizem.  Will increase dosing. TSH is normal. Echo pending. Elevated BNP likely related to rapid heart rate. 3. COPD.  Appears stable. No wheezing 4. Chronic respiratory failure, wears oxygen at night. Appears to be near baseline. CT chest does not show any infiltrates or edema, just atelectasis.  Will encourage incentive spirometry 5. GERD. On PPI 6. History of rheumatoid arthritis. On chronic prednisone and methotrexate 7. Hypokalemia, replace.  Code Status: DNR Family Communication: no family present Disposition Plan: pending hospital course   Consultants:    Procedures:    Antibiotics:    HPI/Subjective: Feeling a little better, chest pain appears to be improving, breathing improving  Objective: Filed Vitals:   07/22/13 0832  BP:   Pulse:   Temp: 98.4 F (36.9 C)  Resp:     Intake/Output Summary (Last 24 hours) at 07/22/13 1103 Last data filed at 07/22/13 1057  Gross per 24 hour  Intake    855 ml  Output   3750 ml  Net  -2895 ml   Filed Weights   07/21/13 0744 07/21/13 1113 07/22/13 0443  Weight: 104.327 kg (230 lb) 98.1 kg (216 lb 4.3 oz) 100.7 kg (222 lb 0.1 oz)    Exam:   General:  NAD  Cardiovascular: S1, S2 irregular  Respiratory: crackles at bases  Abdomen: soft, nt, obese, bs+  Musculoskeletal: no significant pitting edema   Data Reviewed: Basic Metabolic  Panel:  Recent Labs Lab 07/21/13 0805 07/22/13 0512  NA 143 144  K 3.5* 3.6*  CL 101 102  CO2 30 30  GLUCOSE 140* 156*  BUN 12 15  CREATININE 1.03 1.01  CALCIUM 9.7 10.1  MG 1.7  --    Liver Function Tests:  Recent Labs Lab 07/22/13 0512  AST 27  ALT 26  ALKPHOS 59  BILITOT 1.1  PROT 6.4  ALBUMIN 3.2*   No results found for this basename: LIPASE, AMYLASE,  in the last 168 hours No results found for this basename: AMMONIA,  in the last 168 hours CBC:  Recent Labs Lab 07/21/13 0805 07/22/13 0512  WBC 10.3 11.2*  HGB 14.3 14.5  HCT 44.6 44.7  MCV 97.2 96.5  PLT 128* 148*   Cardiac Enzymes:  Recent Labs Lab 07/21/13 0805 07/21/13 1243 07/21/13 1837 07/21/13 2358  TROPONINI <0.30 <0.30 <0.30 <0.30   BNP (last 3 results)  Recent Labs  07/21/13 0805  PROBNP 458.1*   CBG: No results found for this basename: GLUCAP,  in the last 168 hours  Recent Results (from the past 240 hour(s))  MRSA PCR SCREENING     Status: None   Collection Time    07/21/13 12:50 PM      Result Value Ref Range Status   MRSA by PCR NEGATIVE  NEGATIVE Final   Comment:            The GeneXpert  MRSA Assay (FDA     approved for NASAL specimens     only), is one component of a     comprehensive MRSA colonization     surveillance program. It is not     intended to diagnose MRSA     infection nor to guide or     monitor treatment for     MRSA infections.     Studies: Ct Angio Chest Pe W/cm &/or Wo Cm  07/22/2013   CLINICAL DATA:  Chest pain, shortness of breath, tachycardia.  EXAM: CT ANGIOGRAPHY CHEST WITH CONTRAST  TECHNIQUE: Multidetector CT imaging of the chest was performed using the standard protocol during bolus administration of intravenous contrast. Multiplanar CT image reconstructions and MIPs were obtained to evaluate the vascular anatomy.  CONTRAST:  157mL OMNIPAQUE IOHEXOL 350 MG/ML SOLN  COMPARISON:  Chest CT 10/06/2009 and 07/23/2009.  FINDINGS: No filling defects  in the pulmonary arteries to suggest pulmonary emboli. Heart is mildly enlarged. Aortic calcifications without aneurysm or dissection. No mediastinal, hilar, or axillary adenopathy. Chest wall soft tissues are unremarkable.  Minimal dependent atelectasis in the lung bases bilaterally. Lungs are otherwise clear. No pleural effusions.  Imaging into the upper abdomen shows no acute findings. Mild degenerative spurring throughout the thoracic spine.  Review of the MIP images confirms the above findings.  IMPRESSION: No evidence of pulmonary embolus.  Mild cardiomegaly.  Dependent bibasilar atelectasis.   Electronically Signed   By: Rolm Baptise M.D.   On: 07/22/2013 10:02   Dg Chest Port 1 View  07/21/2013   CLINICAL DATA:  Chest pain weakness  EXAM: PORTABLE CHEST - 1 VIEW  COMPARISON:  DG CHEST 2 VIEW dated 10/16/2011; DG CHEST 2 VIEW dated 05/18/2011; CT ABDOMEN W/CM dated 01/07/2011  FINDINGS: Moderate cardiac enlargement. Mild vascular congestion. Mild perihilar peribronchial wall thickening and mild interstitial prominence.  IMPRESSION: Cardiac enlargement and vascular congestion. Suspect mild interstitial pulmonary edema.   Electronically Signed   By: Skipper Cliche M.D.   On: 07/21/2013 08:40    Scheduled Meds: . aspirin EC  325 mg Oral Daily  . diltiazem  30 mg Oral 4 times per day  . DULoxetine  60 mg Oral Daily  . enoxaparin (LOVENOX) injection  40 mg Subcutaneous Daily  . ferrous gluconate  324 mg Oral Daily  . folic acid  1 mg Oral Daily  . losartan  25 mg Oral Daily  . [START ON 07/25/2013] methotrexate  12.5 mg Oral Weekly  . mometasone-formoterol  2 puff Inhalation BID  . pantoprazole  40 mg Oral BID  . predniSONE  5 mg Oral Daily  . pregabalin  50 mg Oral BID  . sodium chloride  3 mL Intravenous Q12H  . sodium chloride      . cyanocobalamin  100 mcg Oral Daily   Continuous Infusions: . diltiazem (CARDIZEM) infusion Stopped (07/21/13 1700)    Principal Problem:   Atrial  fibrillation with rapid ventricular response Active Problems:   COPD (chronic obstructive pulmonary disease)   GERD (gastroesophageal reflux disease)   Chest pain   Chronic respiratory failure   Hypokalemia   Atrial fibrillation with RVR    Time spent: 9mins     Karis Emig  [facility] Pager [phone]. If 7PM-7AM, please contact night-coverage at [email or web address], password [facility] 07/22/2013, 11:03 AM  LOS: 1 day

## 2013-07-22 NOTE — Progress Notes (Signed)
UR chart review completed.  

## 2013-07-23 LAB — BASIC METABOLIC PANEL
BUN: 15 mg/dL (ref 6–23)
CO2: 29 mEq/L (ref 19–32)
Calcium: 10.1 mg/dL (ref 8.4–10.5)
Chloride: 104 mEq/L (ref 96–112)
Creatinine, Ser: 1.04 mg/dL (ref 0.50–1.10)
GFR calc Af Amer: 56 mL/min — ABNORMAL LOW (ref >90)
GFR calc non Af Amer: 48 mL/min — ABNORMAL LOW (ref >90)
GLUCOSE: 179 mg/dL — AB (ref 70–99)
POTASSIUM: 3.9 meq/L (ref 3.7–5.3)
Sodium: 142 mEq/L (ref 137–147)

## 2013-07-23 LAB — CBC
HCT: 40.9 % (ref 36.0–46.0)
HEMOGLOBIN: 13.2 g/dL (ref 12.0–15.0)
MCH: 31.1 pg (ref 26.0–34.0)
MCHC: 32.3 g/dL (ref 30.0–36.0)
MCV: 96.5 fL (ref 78.0–100.0)
Platelets: 172 10*3/uL (ref 150–400)
RBC: 4.24 MIL/uL (ref 3.87–5.11)
RDW: 14.8 % (ref 11.5–15.5)
WBC: 8.2 10*3/uL (ref 4.0–10.5)

## 2013-07-23 MED ORDER — FUROSEMIDE 10 MG/ML IJ SOLN
20.0000 mg | Freq: Once | INTRAMUSCULAR | Status: AC
  Administered 2013-07-23: 20 mg via INTRAVENOUS
  Filled 2013-07-23: qty 2

## 2013-07-23 MED ORDER — DILTIAZEM HCL ER COATED BEADS 120 MG PO CP24
120.0000 mg | ORAL_CAPSULE | Freq: Two times a day (BID) | ORAL | Status: DC
  Administered 2013-07-23 – 2013-07-24 (×3): 120 mg via ORAL
  Filled 2013-07-23 (×3): qty 1

## 2013-07-23 NOTE — Progress Notes (Signed)
[facility] PROGRESS NOTE  [name] [id number] DOB: 03/24/1928 DOA: 07/21/2013 PCP: Delphina Cahill, MD  Assessment/Plan: 1. Chest pain, she has ruled out for ACS with negative cardiac markers.  CT angio chest is negative for PE.  I suspect this may have been related to her rapid atrial fib vs. Musculoskeletal pain. Appreciate cardiology assistance.  No plans on ischemic work up at this time. 2. Rapid atrial fib. Heart rate appears to have improved. She has been weaned off the cardizem infusion.  Now on oral cardizem.  TSH is normal. Echo done with results noted as below. Elevated BNP likely related to rapid heart rate. Patient is unsure regarding starting anticoagulation.  She will be continued on aspirin for now, until she follows up with cardiology in the outpatient setting. 3. COPD.  Appears stable. No wheezing 4. Chronic respiratory failure, wears oxygen at night. Appears to be near baseline. CT chest does not show any infiltrates or edema, just atelectasis.  Will encourage incentive spirometry 5. GERD. On PPI 6. History of rheumatoid arthritis. On chronic prednisone and methotrexate 7. Hypokalemia, replace.  Code Status: DNR Family Communication: no family present Disposition Plan: pending physical therapy evaluation, can likely discharge in the next 24 hours.   Consultants:  Cardiology  Procedures: Echo: - Left ventricle: The cavity size was normal. Wall thickness was increased in a pattern of mild LVH. Systolic function was normal. The estimated ejection fraction was in the range of 60% to 65%. The study was not technically sufficient to allow evaluation of LV diastolic dysfunction due to atrial fibrillation. - Aortic valve: Mildly calcified annulus. Mildly thickened leaflets. Valve area: 1.57cm^2 (Vmax). - Mitral valve: Mildly to moderately calcified annulus. Mildly thickened leaflets . - Left atrium: The atrium was severely dilated. - Right ventricle: The  cavity size was mildly dilated. - Right atrium: The atrium was moderately dilated.    Antibiotics:    HPI/Subjective: Sitting up in chair.  Feeling better. Chest pain resolved.  Breathing improving.  Objective: Filed Vitals:   07/23/13 0800  BP: 158/63  Pulse: 85  Temp: 98 F (36.7 C)  Resp: 16    Intake/Output Summary (Last 24 hours) at 07/23/13 1006 Last data filed at 07/23/13 0600  Gross per 24 hour  Intake    450 ml  Output   3075 ml  Net  -2625 ml   Filed Weights   07/21/13 1113 07/22/13 0443 07/23/13 0500  Weight: 98.1 kg (216 lb 4.3 oz) 100.7 kg (222 lb 0.1 oz) 98.2 kg (216 lb 7.9 oz)    Exam:   General:  NAD  Cardiovascular: S1, S2 irregular  Respiratory: crackles at bases  Abdomen: soft, nt, obese, bs+  Musculoskeletal: no significant pitting edema   Data Reviewed: Basic Metabolic Panel:  Recent Labs Lab 07/21/13 0805 07/22/13 0512 07/23/13 0548  NA 143 144 142  K 3.5* 3.6* 3.9  CL 101 102 104  CO2 30 30 29   GLUCOSE 140* 156* 179*  BUN 12 15 15   CREATININE 1.03 1.01 1.04  CALCIUM 9.7 10.1 10.1  MG 1.7  --   --    Liver Function Tests:  Recent Labs Lab 07/22/13 0512  AST 27  ALT 26  ALKPHOS 59  BILITOT 1.1  PROT 6.4  ALBUMIN 3.2*   No results found for this basename: LIPASE, AMYLASE,  in the last 168 hours No results found for this basename: AMMONIA,  in the last 168 hours CBC:  Recent Labs Lab 07/21/13 0805  07/22/13 0512 07/23/13 0548  WBC 10.3 11.2* 8.2  HGB 14.3 14.5 13.2  HCT 44.6 44.7 40.9  MCV 97.2 96.5 96.5  PLT 128* 148* 172   Cardiac Enzymes:  Recent Labs Lab 07/21/13 0805 07/21/13 1243 07/21/13 1837 07/21/13 2358  TROPONINI <0.30 <0.30 <0.30 <0.30   BNP (last 3 results)  Recent Labs  07/21/13 0805  PROBNP 458.1*   CBG: No results found for this basename: GLUCAP,  in the last 168 hours  Recent Results (from the past 240 hour(s))  MRSA PCR SCREENING     Status: None   Collection Time     07/21/13 12:50 PM      Result Value Ref Range Status   MRSA by PCR NEGATIVE  NEGATIVE Final   Comment:            The GeneXpert MRSA Assay (FDA     approved for NASAL specimens     only), is one component of a     comprehensive MRSA colonization     surveillance program. It is not     intended to diagnose MRSA     infection nor to guide or     monitor treatment for     MRSA infections.     Studies: Ct Angio Chest Pe W/cm &/or Wo Cm  07/22/2013   CLINICAL DATA:  Chest pain, shortness of breath, tachycardia.  EXAM: CT ANGIOGRAPHY CHEST WITH CONTRAST  TECHNIQUE: Multidetector CT imaging of the chest was performed using the standard protocol during bolus administration of intravenous contrast. Multiplanar CT image reconstructions and MIPs were obtained to evaluate the vascular anatomy.  CONTRAST:  139mL OMNIPAQUE IOHEXOL 350 MG/ML SOLN  COMPARISON:  Chest CT 10/06/2009 and 07/23/2009.  FINDINGS: No filling defects in the pulmonary arteries to suggest pulmonary emboli. Heart is mildly enlarged. Aortic calcifications without aneurysm or dissection. No mediastinal, hilar, or axillary adenopathy. Chest wall soft tissues are unremarkable.  Minimal dependent atelectasis in the lung bases bilaterally. Lungs are otherwise clear. No pleural effusions.  Imaging into the upper abdomen shows no acute findings. Mild degenerative spurring throughout the thoracic spine.  Review of the MIP images confirms the above findings.  IMPRESSION: No evidence of pulmonary embolus.  Mild cardiomegaly.  Dependent bibasilar atelectasis.   Electronically Signed   By: Rolm Baptise M.D.   On: 07/22/2013 10:02    Scheduled Meds: . aspirin EC  325 mg Oral Daily  . diltiazem  120 mg Oral BID  . DULoxetine  60 mg Oral Daily  . enoxaparin (LOVENOX) injection  40 mg Subcutaneous Daily  . ferrous gluconate  324 mg Oral Daily  . folic acid  1 mg Oral Daily  . losartan  25 mg Oral Daily  . [START ON 07/25/2013] methotrexate  12.5 mg  Oral Weekly  . mometasone-formoterol  2 puff Inhalation BID  . pantoprazole  40 mg Oral BID  . predniSONE  5 mg Oral Daily  . pregabalin  50 mg Oral BID  . sodium chloride  3 mL Intravenous Q12H  . cyanocobalamin  100 mcg Oral Daily   Continuous Infusions:    Principal Problem:   Atrial fibrillation with rapid ventricular response Active Problems:   COPD (chronic obstructive pulmonary disease)   GERD (gastroesophageal reflux disease)   Chest pain   Chronic respiratory failure   Hypokalemia   Atrial fibrillation with RVR    Time spent: 28mins     Cristiana Yochim  [facility] Pager [phone]. If 7PM-7AM, please  contact night-coverage at [email or web address], password [facility] 07/23/2013, 10:06 AM  LOS: 2 days

## 2013-07-23 NOTE — Progress Notes (Signed)
Patient ID: [name], female   DOB: 12-Sep-1928, 80 y.o.   MRN: [id number]     Subjective:    No complaints overnight   Objective:   Temp:  [97.7 F (36.5 C)-98.7 F (37.1 C)] 97.9 F (36.6 C) (05/12 0400) Pulse Rate:  [81-99] 88 (05/12 0500) Resp:  [14-23] 15 (05/12 0500) BP: (136-148)/(81-105) 145/86 mmHg (05/12 0642) SpO2:  [92 %-100 %] 97 % (05/12 0500) Weight:  [216 lb 7.9 oz (98.2 kg)] 216 lb 7.9 oz (98.2 kg) (05/12 0500) Last BM Date: 07/20/13  Filed Weights   07/21/13 1113 07/22/13 0443 07/23/13 0500  Weight: 216 lb 4.3 oz (98.1 kg) 222 lb 0.1 oz (100.7 kg) 216 lb 7.9 oz (98.2 kg)    Intake/Output Summary (Last 24 hours) at 07/23/13 0825 Last data filed at 07/23/13 0600  Gross per 24 hour  Intake    450 ml  Output   3075 ml  Net  -2625 ml    Telemetry: afib rate 90s  Exam:  General:NAD  Resp: clear anterirally  Cardiac: irreg, no m/r/g, no JVD  GI: abdomen soft, NT, ND  MSK: no LE edema  Neuro: no focal deficits  Psych: appropriate affect  Lab Results:  Basic Metabolic Panel:  Recent Labs Lab 07/21/13 0805 07/22/13 0512 07/23/13 0548  NA 143 144 142  K 3.5* 3.6* 3.9  CL 101 102 104  CO2 30 30 29   GLUCOSE 140* 156* 179*  BUN 12 15 15   CREATININE 1.03 1.01 1.04  CALCIUM 9.7 10.1 10.1  MG 1.7  --   --     Liver Function Tests:  Recent Labs Lab 07/22/13 0512  AST 27  ALT 26  ALKPHOS 59  BILITOT 1.1  PROT 6.4  ALBUMIN 3.2*    CBC:  Recent Labs Lab 07/21/13 0805 07/22/13 0512 07/23/13 0548  WBC 10.3 11.2* 8.2  HGB 14.3 14.5 13.2  HCT 44.6 44.7 40.9  MCV 97.2 96.5 96.5  PLT 128* 148* 172    Cardiac Enzymes:  Recent Labs Lab 07/21/13 1243 07/21/13 1837 07/21/13 2358  TROPONINI <0.30 <0.30 <0.30    BNP:  Recent Labs  07/21/13 0805  PROBNP 458.1*    Coagulation: No results found for this basename: INR,  in the last 168 hours  ECG:   Medications:   Scheduled Medications: . aspirin EC  325  mg Oral Daily  . diltiazem  60 mg Oral 4 times per day  . DULoxetine  60 mg Oral Daily  . enoxaparin (LOVENOX) injection  40 mg Subcutaneous Daily  . ferrous gluconate  324 mg Oral Daily  . folic acid  1 mg Oral Daily  . losartan  25 mg Oral Daily  . [START ON 07/25/2013] methotrexate  12.5 mg Oral Weekly  . mometasone-formoterol  2 puff Inhalation BID  . pantoprazole  40 mg Oral BID  . predniSONE  5 mg Oral Daily  . pregabalin  50 mg Oral BID  . sodium chloride  3 mL Intravenous Q12H  . cyanocobalamin  100 mcg Oral Daily     Infusions: . diltiazem (CARDIZEM) infusion Stopped (07/21/13 1700)     PRN Medications:  sodium chloride, acetaminophen, acetaminophen, albuterol, LORazepam, nitroGLYCERIN, ondansetron (ZOFRAN) IV, ondansetron, sodium chloride    07/22/13 Echo  Study Conclusions - Study data: Technically difficult study. - Left ventricle: The cavity size was normal. Wall thickness was increased in a pattern of mild LVH. Systolic function was normal. The estimated ejection fraction was in  the range of 60% to 65%. The study was not technically sufficient to allow evaluation of LV diastolic dysfunction due to atrial fibrillation. - Aortic valve: Mildly calcified annulus. Mildly thickened leaflets. Valve area: 1.57cm^2 (Vmax). - Mitral valve: Mildly to moderately calcified annulus. Mildly thickened leaflets . - Left atrium: The atrium was severely dilated. - Right ventricle: The cavity size was mildly dilated. - Right atrium: The atrium was moderately dilated.     Assessment/Plan    1. Afib  - new diagnosis for patient, started on dilt gtt and now transitioned to oral dilt. Heart rates by vitals 80 to 90s with stable blood pressure. Tele shows afib rates in 90s - will change dilt to long acting 120mg  bid.  - CHADS2Vasc score is 4 consistent with 4% annual risk of CVA  - discussed risks and benefits of anticoag. Patient reports she often feels dizzy and stumbles  with her gait, she has had at least 4 fairly significant falls over the last 4 years. Discussed with patient and daugthers increased risk of bleeding given falls and fall risk. Will continue ASA for now, readdress with follow up.  - consider transfer to tele floor with one more day of monitoring, potential discharge tomorrow.   2. Chest pain  - atypical, occurred in the setting of afib with RVR. Now resolved, no evidence of ACS  - echo shows normal LVEF, 60-65%.         Carlyle Dolly, M.D., F.A.C.C.

## 2013-07-23 NOTE — Progress Notes (Signed)
PT TRANSRFERING TO ROOM 327. ALERT AND ORIENTED. HR 80'S TO 90'S IN SR. LT AC NSL PATENT. O2 AT 2L/MIN VIA [place]. DIMINISHED BREATH SOUNDS.ANXIETY IS RESOLVED. FOLEY CATH PATENT DRAINING CLEAR TRANSFER REPORT CALLED TO CECILY ALSTON RN ON 300.

## 2013-07-24 LAB — BASIC METABOLIC PANEL
BUN: 23 mg/dL (ref 6–23)
CALCIUM: 10.6 mg/dL — AB (ref 8.4–10.5)
CO2: 29 mEq/L (ref 19–32)
CREATININE: 1.27 mg/dL — AB (ref 0.50–1.10)
Chloride: 104 mEq/L (ref 96–112)
GFR calc Af Amer: 44 mL/min — ABNORMAL LOW (ref >90)
GFR calc non Af Amer: 38 mL/min — ABNORMAL LOW (ref >90)
GLUCOSE: 146 mg/dL — AB (ref 70–99)
Potassium: 4.1 mEq/L (ref 3.7–5.3)
SODIUM: 143 meq/L (ref 137–147)

## 2013-07-24 MED ORDER — DILTIAZEM HCL ER COATED BEADS 120 MG PO CP24: 120.0000 mg | ORAL_CAPSULE | Freq: Two times a day (BID) | ORAL | Status: DC

## 2013-07-24 MED ORDER — ASPIRIN 325 MG PO TBEC: 325.0000 mg | DELAYED_RELEASE_TABLET | Freq: Every day | ORAL | Status: DC

## 2013-07-24 NOTE — Evaluation (Signed)
Physical Therapy Evaluation Patient Details Name: [name] MRN: [id number] DOB: 1929/03/07 Today's Date: 07/24/2013   History of Present Illness  [name] is a 80 y.o. female with multiple medical problems who presents to the emergency room with complaints of chest pain. Patient is a poor historian therefore history is somewhat limited. She reports waking up this morning around 5:30 with complaints of substernal chest pain. She feels it may be radiating to her neck. She feels is worse with deep breathing. She may have some shortness of breath, although she is not sure. She chronically wears oxygen during the night. She has a chronic cough. Denies any fever. She didn't feel nauseous this morning but did not have any vomiting. Her discomfort has been present since this morning. She reports having a similar episode approximately one week ago which self resolved. She was evaluated in the emergency room and noted to be in rapid atrial fibrillation. Chest x-ray indicated possible pulmonary edema, BNP was mildly elevated. She's been referred for admission  Clinical Impression  Pt states her home is rather small and she would be able to walk from her bedroom to the kitchen and to her living room in the distance that we had walked.    Follow Up Recommendations Home health PT    Equipment Recommendations    none   Recommendations for Other Services   none    Precautions / Restrictions Precautions Precautions: None Restrictions Weight Bearing Restrictions: No      Mobility  Bed Mobility Overal bed mobility: Modified Independent                Transfers Overall transfer level: Modified independent Equipment used: Rolling walker (2 wheeled)                Ambulation/Gait Ambulation/Gait assistance: Modified independent (Device/Increase time) Ambulation Distance (Feet): 80 Feet Assistive device: Rolling walker (2 wheeled)     Gait velocity interpretation:  at or above normal speed for age/gender              Pertinent Vitals/Pain None noted    Home Living Family/patient expects to be discharged to:: Unsure Living Arrangements: Alone               Additional Comments: Pt normally has an aide come in everyday to make meals but the aide is currently in the hospital as well    Prior Function Level of Independence: Independent with assistive device(s);Needs assistance   Gait / Transfers Assistance Needed: ambulates with rollalator  ADL's / Homemaking Assistance Needed: assist           Extremity/Trunk Assessment          Lower Extremity Assessment: Overall WFL for tasks assessed         Communication   Communication: No difficulties  Cognition Arousal/Alertness: Awake/alert Behavior During Therapy: WFL for tasks assessed/performed Overall Cognitive Status: Within Functional Limits for tasks assessed                               Assessment/Plan    PT Assessment Patient needs continued PT services  PT Diagnosis Generalized weakness   PT Problem List Decreased activity tolerance  PT Treatment Interventions Gait training;Therapeutic activities;Therapeutic exercise   PT Goals (Current goals can be found in the Care Plan section)      Frequency Min 3X/week      Barriers to discharge:  According to pt the individual who comes in to make her meals has recently been admitted to the hospital.     End of Session Equipment Utilized During Treatment: Gait belt Activity Tolerance: Patient tolerated treatment well (slightly SOB replaced O2 at end of ambulation) Patient left: in chair;with call bell/phone within reach;with chair alarm set           Time: 1034-1100 PT Time Calculation (min): 26 min   Charges:   PT Evaluation $Initial PT Evaluation Tier I: 1 Procedure     PT G Codes:          Leeroy Cha 07/24/2013, 11:06 AM

## 2013-07-24 NOTE — Discharge Summary (Signed)
Physician Discharge Summary  [name] XBJ:[id number] DOB: [birth year] DOA: 07/21/2013  PCP: Delphina Cahill, MD  Admit date: 07/21/2013 Discharge date: 07/24/2013  Time spent: 35 minutes  Recommendations for Outpatient Follow-up:  1. Needs to follow up with cardiologist for further evaluation treatment of a fib. 2. Consider resume lisinopril if renal function stable. Need a B-met.   Discharge Diagnoses:    Atrial fibrillation with rapid ventricular response   COPD (chronic obstructive pulmonary disease)   GERD (gastroesophageal reflux disease)   Chest pain   Chronic respiratory failure   Hypokalemia   Atrial fibrillation with RVR   Discharge Condition: Stable.  Diet recommendation: Heart Healthy  Filed Weights   07/21/13 1113 07/22/13 0443 07/23/13 0500  Weight: 98.1 kg (216 lb 4.3 oz) 100.7 kg (222 lb 0.1 oz) 98.2 kg (216 lb 7.9 oz)    History of present illness:  [name] is a 80 y.o. female with multiple medical problems who presents to the emergency room with complaints of chest pain. Patient is a poor historian therefore history is somewhat limited. She reports waking up this morning around 5:30 with complaints of substernal chest pain. She feels it may be radiating to her neck. She feels is worse with deep breathing. She may have some shortness of breath, although she is not sure. She chronically wears oxygen during the night. She has a chronic cough. Denies any fever. She didn't feel nauseous this morning but did not have any vomiting. Her discomfort has been present since this morning. She reports having a similar episode approximately one week ago which self resolved. She was evaluated in the emergency room and noted to be in rapid atrial fibrillation. Chest x-ray indicated possible pulmonary edema, BNP was mildly elevated. She's been referred for admission   Hospital Course:  Chest pain, she has ruled out for ACS with negative cardiac markers. CT angio chest  is negative for PE. I suspect this may have been related to her rapid atrial fib vs. Musculoskeletal pain. Appreciate cardiology assistance. No plans on ischemic work up at this time.  Rapid atrial fib. Heart rate appears to have improved. She has been weaned off the cardizem infusion. Now on oral cardizem. TSH is normal. Echo done with results noted as below. Elevated BNP likely related to rapid heart rate. Patient is unsure regarding starting anticoagulation. She will be continued on aspirin for now, until she follows up with cardiology in the outpatient setting. COPD. Appears stable. No wheezing Chronic respiratory failure, wears oxygen at night. Appears to be near baseline. CT chest does not show any infiltrates or edema, just atelectasis. Will encourage incentive spirometry GERD. On PPI History of rheumatoid arthritis. On chronic prednisone and methotrexate Hypokalemia, replace. Mild renal insufficiency; hold lisinopril at discharge.   Procedures: Echo: - Left ventricle: The cavity size was normal. Wall thickness was increased in a pattern of mild LVH. Systolic function was normal. The estimated ejection fraction was in the range of 60% to 65%. The study was not technically sufficient to allow evaluation of LV diastolic dysfunction due to atrial fibrillation. - Aortic valve: Mildly calcified annulus. Mildly thickened leaflets. Valve area: 1.57cm^2 (Vmax). - Mitral valve: Mildly to moderately calcified annulus. Mildly thickened leaflets . - Left atrium: The atrium was severely dilated. - Right ventricle: The cavity size was mildly dilated. - Right atrium: The atrium was moderately dilated.   Consultations:  Cardiology  Discharge Exam: Filed Vitals:   07/24/13 0700  BP: 131/61  Pulse: 94  Temp: 97.6 F (36.4 C)  Resp: 14    General: no distress.  Cardiovascular: S 1, S 2 IRR Respiratory: CTA  Discharge Instructions You were cared for by a hospitalist during your hospital  stay. If you have any questions about your discharge medications or the care you received while you were in the hospital after you are discharged, you can call the unit and asked to speak with the hospitalist on call if the hospitalist that took care of you is not available. Once you are discharged, your primary care physician will handle any further medical issues. Please note that NO REFILLS for any discharge medications will be authorized once you are discharged, as it is imperative that you return to your primary care physician (or establish a relationship with a primary care physician if you do not have one) for your aftercare needs so that they can reassess your need for medications and monitor your lab values.  Discharge Orders   Future Orders Complete By Expires   Diet - low sodium heart healthy  As directed    Increase activity slowly  As directed        Medication List    STOP taking these medications       aspirin 81 MG tablet  Replaced by:  aspirin 325 MG EC tablet     losartan 25 MG tablet  Commonly known as:  COZAAR      TAKE these medications       ADVAIR DISKUS 250-50 MCG/DOSE Aepb  Generic drug:  Fluticasone-Salmeterol  Inhale 1 puff into the lungs every 12 (twelve) hours.     aspirin 325 MG EC tablet  Take 1 tablet (325 mg total) by mouth daily.     cyanocobalamin 100 MCG tablet  Take 100 mcg by mouth daily.     diltiazem 120 MG 24 hr capsule  Commonly known as:  CARDIZEM CD  Take 1 capsule (120 mg total) by mouth 2 (two) times daily.     DULoxetine 60 MG capsule  Commonly known as:  CYMBALTA  Take 60 mg by mouth daily.     ferrous gluconate 325 MG tablet  Commonly known as:  FERGON  Take 325 mg by mouth daily.     folic acid 1 MG tablet  Commonly known as:  FOLVITE  Take 1 mg by mouth daily.     LORazepam 0.5 MG tablet  Commonly known as:  ATIVAN  Take 0.5 mg by mouth every 6 (six) hours as needed for anxiety.     methotrexate 2.5 MG tablet   Commonly known as:  RHEUMATREX  Take 12.5 mg by mouth once a week. *Taken on Thursdays at 1200Caution:Chemotherapy. Protect from light.     oxyCODONE-acetaminophen 7.5-325 MG per tablet  Commonly known as:  PERCOCET  Take 1 tablet by mouth 4 (four) times daily as needed.     pantoprazole 40 MG tablet  Commonly known as:  PROTONIX  Take 40 mg by mouth 2 (two) times daily.     polyethylene glycol packet  Commonly known as:  MIRALAX / GLYCOLAX  Take 17 g by mouth as directed.     predniSONE 5 MG tablet  Commonly known as:  DELTASONE  Take 5 mg by mouth daily.     pregabalin 50 MG capsule  Commonly known as:  LYRICA  Take 50 mg by mouth 2 (two) times daily.     PROAIR HFA 108 (90 BASE) MCG/ACT inhaler  Generic drug:  albuterol  Inhale 2 puffs into the lungs every 6 (six) hours as needed. For rescue     PROBIOTIC FORMULA PO  Take by mouth.     raloxifene 60 MG tablet  Commonly known as:  EVISTA  Take 60 mg by mouth daily.     rosuvastatin 20 MG tablet  Commonly known as:  CRESTOR  Take 20 mg by mouth daily.     vitamin C 500 MG tablet  Commonly known as:  ASCORBIC ACID  Take 500 mg by mouth daily.     Vitamin D3 1000 UNITS Caps  Take 1 capsule by mouth daily.       Allergies  Allergen Reactions  . Other     Band Aids   . Tape   . Lipitor [Atorvastatin Calcium] Rash and Other (See Comments)    Muscle pain  . Niaspan [Niacin Er] Rash and Other (See Comments)    Muscle pain       Follow-up Information   Follow up with Delphina Cahill, MD In 1 week.   Specialty:  Internal Medicine   Contact information:    [place] [address] [phone]       Follow up with Carlyle Dolly, F, MD In 2 weeks.   Specialty:  Cardiology   Contact information:   [address] [place] [address] [phone]        The results of significant diagnostics from this hospitalization (including imaging, microbiology, ancillary and laboratory)  are listed below for reference.    Significant Diagnostic Studies: Ct Angio Chest Pe W/cm &/or Wo Cm  07/22/2013   CLINICAL DATA:  Chest pain, shortness of breath, tachycardia.  EXAM: CT ANGIOGRAPHY CHEST WITH CONTRAST  TECHNIQUE: Multidetector CT imaging of the chest was performed using the standard protocol during bolus administration of intravenous contrast. Multiplanar CT image reconstructions and MIPs were obtained to evaluate the vascular anatomy.  CONTRAST:  147mL OMNIPAQUE IOHEXOL 350 MG/ML SOLN  COMPARISON:  Chest CT 10/06/2009 and 07/23/2009.  FINDINGS: No filling defects in the pulmonary arteries to suggest pulmonary emboli. Heart is mildly enlarged. Aortic calcifications without aneurysm or dissection. No mediastinal, hilar, or axillary adenopathy. Chest wall soft tissues are unremarkable.  Minimal dependent atelectasis in the lung bases bilaterally. Lungs are otherwise clear. No pleural effusions.  Imaging into the upper abdomen shows no acute findings. Mild degenerative spurring throughout the thoracic spine.  Review of the MIP images confirms the above findings.  IMPRESSION: No evidence of pulmonary embolus.  Mild cardiomegaly.  Dependent bibasilar atelectasis.   Electronically Signed   By: Rolm Baptise M.D.   On: 07/22/2013 10:02   Dg Chest Port 1 View  07/21/2013   CLINICAL DATA:  Chest pain weakness  EXAM: PORTABLE CHEST - 1 VIEW  COMPARISON:  DG CHEST 2 VIEW dated 10/16/2011; DG CHEST 2 VIEW dated 05/18/2011; CT ABDOMEN W/CM dated 01/07/2011  FINDINGS: Moderate cardiac enlargement. Mild vascular congestion. Mild perihilar peribronchial wall thickening and mild interstitial prominence.  IMPRESSION: Cardiac enlargement and vascular congestion. Suspect mild interstitial pulmonary edema.   Electronically Signed   By: Skipper Cliche M.D.   On: 07/21/2013 08:40    Microbiology: Recent Results (from the past 240 hour(s))  MRSA PCR SCREENING     Status: None   Collection Time    07/21/13 12:50  PM      Result Value Ref Range Status   MRSA by PCR NEGATIVE  NEGATIVE Final  Comment:            The GeneXpert MRSA Assay (FDA     approved for NASAL specimens     only), is one component of a     comprehensive MRSA colonization     surveillance program. It is not     intended to diagnose MRSA     infection nor to guide or     monitor treatment for     MRSA infections.     Labs: Basic Metabolic Panel:  Recent Labs Lab 07/21/13 0805 07/22/13 0512 07/23/13 0548 07/24/13 0616  NA 143 144 142 143  K 3.5* 3.6* 3.9 4.1  CL 101 102 104 104  CO2 $Re'30 30 29 29  'NJB$ GLUCOSE 140* 156* 179* 146*  BUN $Re'12 15 15 23  'Kfy$ CREATININE 1.03 1.01 1.04 1.27*  CALCIUM 9.7 10.1 10.1 10.6*  MG 1.7  --   --   --    Liver Function Tests:  Recent Labs Lab 07/22/13 0512  AST 27  ALT 26  ALKPHOS 59  BILITOT 1.1  PROT 6.4  ALBUMIN 3.2*   No results found for this basename: LIPASE, AMYLASE,  in the last 168 hours No results found for this basename: AMMONIA,  in the last 168 hours CBC:  Recent Labs Lab 07/21/13 0805 07/22/13 0512 07/23/13 0548  WBC 10.3 11.2* 8.2  HGB 14.3 14.5 13.2  HCT 44.6 44.7 40.9  MCV 97.2 96.5 96.5  PLT 128* 148* 172   Cardiac Enzymes:  Recent Labs Lab 07/21/13 0805 07/21/13 1243 07/21/13 1837 07/21/13 2358  TROPONINI <0.30 <0.30 <0.30 <0.30   BNP: BNP (last 3 results)  Recent Labs  07/21/13 0805  PROBNP 458.1*   CBG: No results found for this basename: GLUCAP,  in the last 168 hours     Signed:  Belkys A Regalado  [facility] 07/24/2013, 11:15 AM

## 2013-07-24 NOTE — Progress Notes (Signed)
Patient ID: [name], female   DOB: October 29, 1928, 80 y.o.   MRN: [id number]    Subjective:    No complaints this morning  Objective:   Temp:  [97.3 F (36.3 C)-98.5 F (36.9 C)] 97.6 F (36.4 C) (05/13 0700) Pulse Rate:  [75-94] 94 (05/13 0700) Resp:  [14-27] 14 (05/13 0700) BP: (126-132)/(48-77) 131/61 mmHg (05/13 0700) SpO2:  [84 %-97 %] 97 % (05/13 0816) Last BM Date: 07/22/13  Filed Weights   07/21/13 1113 07/22/13 0443 07/23/13 0500  Weight: 216 lb 4.3 oz (98.1 kg) 222 lb 0.1 oz (100.7 kg) 216 lb 7.9 oz (98.2 kg)    Intake/Output Summary (Last 24 hours) at 07/24/13 0941 Last data filed at 07/24/13 0700  Gross per 24 hour  Intake    240 ml  Output   1750 ml  Net  -1510 ml    Telemetry: None  Exam:  General: NAD  Resp: clear anteriorally  Cardiac: irreg, reg rate, no m/r/g, no JVD  GI: abdomen soft, NT, ND  MSK: no LE edema  Neuro: no focal deficits  Psych: appropriate affect  Lab Results:  Basic Metabolic Panel:  Recent Labs Lab 07/21/13 0805 07/22/13 0512 07/23/13 0548 07/24/13 0616  NA 143 144 142 143  K 3.5* 3.6* 3.9 4.1  CL 101 102 104 104  CO2 30 30 29 29   GLUCOSE 140* 156* 179* 146*  BUN 12 15 15 23   CREATININE 1.03 1.01 1.04 1.27*  CALCIUM 9.7 10.1 10.1 10.6*  MG 1.7  --   --   --     Liver Function Tests:  Recent Labs Lab 07/22/13 0512  AST 27  ALT 26  ALKPHOS 59  BILITOT 1.1  PROT 6.4  ALBUMIN 3.2*    CBC:  Recent Labs Lab 07/21/13 0805 07/22/13 0512 07/23/13 0548  WBC 10.3 11.2* 8.2  HGB 14.3 14.5 13.2  HCT 44.6 44.7 40.9  MCV 97.2 96.5 96.5  PLT 128* 148* 172    Cardiac Enzymes:  Recent Labs Lab 07/21/13 1243 07/21/13 1837 07/21/13 2358  TROPONINI <0.30 <0.30 <0.30    BNP:  Recent Labs  07/21/13 0805  PROBNP 458.1*    Coagulation: No results found for this basename: INR,  in the last 168 hours  ECG:   Medications:   Scheduled Medications: . aspirin EC  325 mg Oral Daily  .  diltiazem  120 mg Oral BID  . DULoxetine  60 mg Oral Daily  . enoxaparin (LOVENOX) injection  40 mg Subcutaneous Daily  . ferrous gluconate  324 mg Oral Daily  . folic acid  1 mg Oral Daily  . losartan  25 mg Oral Daily  . [START ON 07/25/2013] methotrexate  12.5 mg Oral Weekly  . mometasone-formoterol  2 puff Inhalation BID  . pantoprazole  40 mg Oral BID  . predniSONE  5 mg Oral Daily  . pregabalin  50 mg Oral BID  . sodium chloride  3 mL Intravenous Q12H  . cyanocobalamin  100 mcg Oral Daily     Infusions:     PRN Medications:  sodium chloride, acetaminophen, acetaminophen, albuterol, LORazepam, nitroGLYCERIN, ondansetron (ZOFRAN) IV, ondansetron, sodium chloride     Assessment/Plan    1. Afib  - new diagnosis for patient, started on dilt gtt and now transitioned to oral dilt. Heart rates by vitals 80 to 90s with stable blood pressure. Currently on dilt CD 120mg  bid.  - CHADS2Vasc score is 4 consistent with 4% annual risk of  CVA  - discussed risks and benefits of anticoag. Patient reports she often feels dizzy and stumbles with her gait, she has had at least 4 fairly significant falls over the last 4 years. Discussed with patient and daugthers increased risk of bleeding given falls and fall risk. Will continue ASA for now, readdress with follow up.    2. Chest pain  - atypical, occurred in the setting of afib with RVR. Now resolved, no evidence of ACS  - echo shows normal LVEF, 60-65%.    Will sign off of inpatient care. Plan as described above. Patient will need follow up 2-3 weeks with [place] with NP Purcell Nails after discharge. Please call with questions.     Carlyle Dolly, M.D., F.A.C.C.

## 2013-07-24 NOTE — Care Management Note (Signed)
    Page 1 of 1   07/24/2013     2:05:14 PM CARE MANAGEMENT NOTE 07/24/2013  Patient:  [name], [name]   Account Number:  [id number]  Date Initiated:  07/24/2013  Documentation initiated by:  Claretha Cooper  Subjective/Objective Assessment:   pt admitted from home where she lives alone in a senior appt. Pt has an aide but the aide is also hospitalized. [name] is arranging another aide and requesting extended hours. Tye [place], daughter is taking pt home today with [facility] RN and PT.     Action/Plan:   Anticipated DC Date:  07/24/2013   Anticipated DC Plan:  [place]  CM consult      Choice offered to / List presented to:          [facility] arranged  HH-1 RN  [place].   Status of service:  Completed, signed off Medicare Important Message given?  YES (If response is "NO", the following Medicare IM given date fields will be blank) Date Medicare IM given:  07/24/2013 Date Additional Medicare IM given:    Discharge Disposition:  [place]  Per UR Regulation:    If discussed at Long Length of Stay Meetings, dates discussed:    Comments:  07/24/13 Claretha Cooper RN BSN CM

## 2013-07-24 NOTE — Progress Notes (Addendum)
NURSING PROGRESS NOTE  [name] [id number] Discharge Data: 07/24/2013 3:38 PM Attending Provider: No att. providers found [id number], [facility], MD   [name] to be D/C'd Home per MD order.    All IV's and catheter will be discontinued and monitored for bleeding.  All belongings will be returned to patient for patient to take home.  Educational handout was discussed and provided to patient regarding atrial fibrillation. AVS was reviewed and patient verbalized understanding of discharge instructions. Family at bedside during education. Instructed to follow-up with advanced home health for RN and PT care.  The earliest follow-up appointment available with internal medicine MD, Delphina Cahill is 08/19/2013. Dr. Tyrell Antonio made aware and stated that was fine as long as patient was able to have follow-up with cardiologist (Branch)--scheduled for 08/07/13.   Last Documented Vital Signs:  Blood pressure 131/61, pulse 94, temperature 97.6 F (36.4 C), temperature source Oral, resp. rate 14, height 5' (1.524 m), weight 98.2 kg (216 lb 7.9 oz), SpO2 97.00%.  [name]

## 2013-07-24 NOTE — Progress Notes (Signed)
Patient's IV sites (2) removed.  Sites both WNL.  Patient's foley catheter removed.  Patient voided after catheter removal.

## 2013-07-24 NOTE — Progress Notes (Signed)
Patient to be discharged today.  Daughter, Catarina Hartshorn, has questions for the doctor.  Dr. Tyrell Antonio notified.

## 2013-07-26 ENCOUNTER — Encounter (HOSPITAL_COMMUNITY): Payer: Self-pay | Admitting: Emergency Medicine

## 2013-07-26 ENCOUNTER — Inpatient Hospital Stay (HOSPITAL_COMMUNITY)
Admission: EM | Admit: 2013-07-26 | Discharge: 2013-07-30 | DRG: 309 | Disposition: A | Discharge status: Skilled Nursing Facility | Payer: Medicare Other | Attending: Internal Medicine | Admitting: Internal Medicine

## 2013-07-26 ENCOUNTER — Emergency Department (HOSPITAL_COMMUNITY): Payer: Medicare Other

## 2013-07-26 DIAGNOSIS — E876 Hypokalemia: Secondary | ICD-10-CM

## 2013-07-26 DIAGNOSIS — Z66 Do not resuscitate: Secondary | ICD-10-CM | POA: Diagnosis present

## 2013-07-26 DIAGNOSIS — Z87891 Personal history of nicotine dependence: Secondary | ICD-10-CM

## 2013-07-26 DIAGNOSIS — IMO0002 Reserved for concepts with insufficient information to code with codable children: Secondary | ICD-10-CM | POA: Diagnosis not present

## 2013-07-26 DIAGNOSIS — M25569 Pain in unspecified knee: Secondary | ICD-10-CM

## 2013-07-26 DIAGNOSIS — I4891 Unspecified atrial fibrillation: Principal | ICD-10-CM | POA: Diagnosis present

## 2013-07-26 DIAGNOSIS — E559 Vitamin D deficiency, unspecified: Secondary | ICD-10-CM | POA: Diagnosis present

## 2013-07-26 DIAGNOSIS — Z85828 Personal history of other malignant neoplasm of skin: Secondary | ICD-10-CM | POA: Diagnosis not present

## 2013-07-26 DIAGNOSIS — D72829 Elevated white blood cell count, unspecified: Secondary | ICD-10-CM | POA: Diagnosis present

## 2013-07-26 DIAGNOSIS — Z79899 Other long term (current) drug therapy: Secondary | ICD-10-CM | POA: Diagnosis not present

## 2013-07-26 DIAGNOSIS — I129 Hypertensive chronic kidney disease with stage 1 through stage 4 chronic kidney disease, or unspecified chronic kidney disease: Secondary | ICD-10-CM | POA: Diagnosis present

## 2013-07-26 DIAGNOSIS — IMO0001 Reserved for inherently not codable concepts without codable children: Secondary | ICD-10-CM | POA: Diagnosis present

## 2013-07-26 DIAGNOSIS — G608 Other hereditary and idiopathic neuropathies: Secondary | ICD-10-CM

## 2013-07-26 DIAGNOSIS — E785 Hyperlipidemia, unspecified: Secondary | ICD-10-CM | POA: Diagnosis present

## 2013-07-26 DIAGNOSIS — J9611 Chronic respiratory failure with hypoxia: Secondary | ICD-10-CM | POA: Diagnosis present

## 2013-07-26 DIAGNOSIS — R5383 Other fatigue: Secondary | ICD-10-CM

## 2013-07-26 DIAGNOSIS — I248 Other forms of acute ischemic heart disease: Secondary | ICD-10-CM | POA: Diagnosis present

## 2013-07-26 DIAGNOSIS — F3289 Other specified depressive episodes: Secondary | ICD-10-CM | POA: Diagnosis present

## 2013-07-26 DIAGNOSIS — R5381 Other malaise: Secondary | ICD-10-CM | POA: Diagnosis present

## 2013-07-26 DIAGNOSIS — K219 Gastro-esophageal reflux disease without esophagitis: Secondary | ICD-10-CM | POA: Diagnosis present

## 2013-07-26 DIAGNOSIS — I2489 Other forms of acute ischemic heart disease: Secondary | ICD-10-CM | POA: Diagnosis present

## 2013-07-26 DIAGNOSIS — F329 Major depressive disorder, single episode, unspecified: Secondary | ICD-10-CM | POA: Diagnosis present

## 2013-07-26 DIAGNOSIS — Z6839 Body mass index (BMI) 39.0-39.9, adult: Secondary | ICD-10-CM | POA: Diagnosis not present

## 2013-07-26 DIAGNOSIS — R079 Chest pain, unspecified: Secondary | ICD-10-CM | POA: Diagnosis present

## 2013-07-26 DIAGNOSIS — E669 Obesity, unspecified: Secondary | ICD-10-CM | POA: Diagnosis present

## 2013-07-26 DIAGNOSIS — D649 Anemia, unspecified: Secondary | ICD-10-CM | POA: Diagnosis not present

## 2013-07-26 DIAGNOSIS — J4489 Other specified chronic obstructive pulmonary disease: Secondary | ICD-10-CM | POA: Diagnosis present

## 2013-07-26 DIAGNOSIS — N179 Acute kidney failure, unspecified: Secondary | ICD-10-CM | POA: Diagnosis present

## 2013-07-26 DIAGNOSIS — J449 Chronic obstructive pulmonary disease, unspecified: Secondary | ICD-10-CM | POA: Diagnosis present

## 2013-07-26 DIAGNOSIS — J9610 Chronic respiratory failure, unspecified whether with hypoxia or hypercapnia: Secondary | ICD-10-CM | POA: Diagnosis present

## 2013-07-26 DIAGNOSIS — M549 Dorsalgia, unspecified: Secondary | ICD-10-CM

## 2013-07-26 DIAGNOSIS — M069 Rheumatoid arthritis, unspecified: Secondary | ICD-10-CM | POA: Diagnosis present

## 2013-07-26 DIAGNOSIS — N189 Chronic kidney disease, unspecified: Secondary | ICD-10-CM | POA: Diagnosis present

## 2013-07-26 DIAGNOSIS — R7309 Other abnormal glucose: Secondary | ICD-10-CM | POA: Diagnosis present

## 2013-07-26 DIAGNOSIS — S82839A Other fracture of upper and lower end of unspecified fibula, initial encounter for closed fracture: Secondary | ICD-10-CM

## 2013-07-26 DIAGNOSIS — M0520 Rheumatoid vasculitis with rheumatoid arthritis of unspecified site: Secondary | ICD-10-CM | POA: Diagnosis present

## 2013-07-26 DIAGNOSIS — Z7982 Long term (current) use of aspirin: Secondary | ICD-10-CM | POA: Diagnosis not present

## 2013-07-26 DIAGNOSIS — M797 Fibromyalgia: Secondary | ICD-10-CM | POA: Diagnosis present

## 2013-07-26 HISTORY — DX: Dependence on supplemental oxygen: Z99.81

## 2013-07-26 HISTORY — DX: Chronic respiratory failure, unspecified whether with hypoxia or hypercapnia: J96.10

## 2013-07-26 HISTORY — DX: Unspecified atrial fibrillation: I48.91

## 2013-07-26 LAB — PROTIME-INR
INR: 1.02 (ref 0.00–1.49)
Prothrombin Time: 13.2 seconds (ref 11.6–15.2)

## 2013-07-26 LAB — CBG MONITORING, ED: Glucose-Capillary: 236 mg/dL — ABNORMAL HIGH (ref 70–99)

## 2013-07-26 LAB — CBC
HCT: 45.3 % (ref 36.0–46.0)
Hemoglobin: 14.6 g/dL (ref 12.0–15.0)
MCH: 31.2 pg (ref 26.0–34.0)
MCHC: 32.2 g/dL (ref 30.0–36.0)
MCV: 96.8 fL (ref 78.0–100.0)
Platelets: 204 10*3/uL (ref 150–400)
RBC: 4.68 MIL/uL (ref 3.87–5.11)
RDW: 14.8 % (ref 11.5–15.5)
WBC: 13.9 10*3/uL — ABNORMAL HIGH (ref 4.0–10.5)

## 2013-07-26 LAB — TROPONIN I
Troponin I: <0.3 ng/mL (ref <0.30)
Troponin I: <0.3 ng/mL (ref <0.30)

## 2013-07-26 LAB — URINALYSIS, ROUTINE W REFLEX MICROSCOPIC
Bilirubin Urine: NEGATIVE
GLUCOSE, UA: NEGATIVE mg/dL
Hgb urine dipstick: NEGATIVE
Ketones, ur: NEGATIVE mg/dL
LEUKOCYTES UA: NEGATIVE
NITRITE: NEGATIVE
PROTEIN: NEGATIVE mg/dL
Specific Gravity, Urine: >1.03 — ABNORMAL HIGH (ref 1.005–1.030)
UROBILINOGEN UA: 0.2 mg/dL (ref 0.0–1.0)
pH: 5.5 (ref 5.0–8.0)

## 2013-07-26 LAB — BASIC METABOLIC PANEL
BUN: 24 mg/dL — ABNORMAL HIGH (ref 6–23)
CALCIUM: 9.9 mg/dL (ref 8.4–10.5)
CO2: 27 mEq/L (ref 19–32)
CREATININE: 1.14 mg/dL — AB (ref 0.50–1.10)
Chloride: 99 mEq/L (ref 96–112)
GFR calc Af Amer: 50 mL/min — ABNORMAL LOW (ref >90)
GFR, EST NON AFRICAN AMERICAN: 43 mL/min — AB (ref >90)
GLUCOSE: 148 mg/dL — AB (ref 70–99)
Potassium: 3.9 mEq/L (ref 3.7–5.3)
SODIUM: 138 meq/L (ref 137–147)

## 2013-07-26 LAB — PRO B NATRIURETIC PEPTIDE: Pro B Natriuretic peptide (BNP): 425 pg/mL (ref 0–450)

## 2013-07-26 MED ORDER — DILTIAZEM HCL ER COATED BEADS 120 MG PO CP24
ORAL_CAPSULE | ORAL | Status: AC
  Filled 2013-07-26: qty 1

## 2013-07-26 MED ORDER — DILTIAZEM HCL ER COATED BEADS 120 MG PO CP24
120.0000 mg | ORAL_CAPSULE | Freq: Every day | ORAL | Status: DC
  Administered 2013-07-26: 120 mg via ORAL
  Filled 2013-07-26 (×3): qty 1

## 2013-07-26 MED ORDER — HEPARIN (PORCINE) IN NACL 100-0.45 UNIT/ML-% IJ SOLN: 950.0000 [IU]/h | INTRAMUSCULAR | Status: DC

## 2013-07-26 MED ORDER — METOPROLOL TARTRATE 1 MG/ML IV SOLN
5.0000 mg | Freq: Once | INTRAVENOUS | Status: AC
  Administered 2013-07-26: 5 mg via INTRAVENOUS
  Filled 2013-07-26: qty 5

## 2013-07-26 MED ORDER — ASPIRIN EC 325 MG PO TBEC
325.0000 mg | DELAYED_RELEASE_TABLET | Freq: Once | ORAL | Status: DC
  Administered 2013-07-26: 325 mg via ORAL
  Filled 2013-07-26: qty 1

## 2013-07-26 MED ORDER — ASPIRIN EC 325 MG PO TBEC: 325.0000 mg | DELAYED_RELEASE_TABLET | Freq: Every day | ORAL | Status: DC

## 2013-07-26 MED ORDER — SODIUM CHLORIDE 0.9 % IV BOLUS (SEPSIS)
500.0000 mL | INTRAVENOUS | Status: DC | PRN
  Administered 2013-07-26: 500 mL via INTRAVENOUS

## 2013-07-26 NOTE — ED Notes (Signed)
Unable to tolerate orthostatics while standing

## 2013-07-26 NOTE — Progress Notes (Signed)
ANTICOAGULATION CONSULT NOTE - Initial Consult  Pharmacy Consult for Hearin Indication: atrial fibrillation  Allergies  Allergen Reactions  . Other     Band Aids   . Tape   . Lipitor [Atorvastatin Calcium] Rash and Other (See Comments)    Muscle pain  . Niaspan [Niacin Er] Rash and Other (See Comments)    Muscle pain    Patient Measurements:   Heparin Dosing Weight: 68.9 kg  Vital Signs: Temp: 97.9 F (36.6 C) (05/15 2018) Temp src: Oral (05/15 2018) BP: 101/80 mmHg (05/15 2101) Pulse Rate: 122 (05/15 2018)  Labs:  Recent Labs  07/24/13 0616 07/26/13 1400 07/26/13 1423 07/26/13 1544  HGB  --   --  14.6  --   HCT  --   --  45.3  --   PLT  --   --  204  --   CREATININE 1.27*  --  1.14*  --   TROPONINI  --  <0.30  --  <0.30    The CrCl is unknown because both a height and weight (above a minimum accepted value) are required for this calculation.   Medical History: Past Medical History  Diagnosis Date  . Anemia   . Back pain   . COPD (chronic obstructive pulmonary disease)   . Depression   . Fibromyalgia   . Hypercholesteremia   . Hypertension   . Insomnia   . Rheumatoid arteritis   . Cancer     Skin   . Vitamin D deficiency   . 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 Generalized anxiety disorder   . GERD (gastroesophageal reflux disease)   . Bacterial pneumonia   . Fatty liver   . Internal hemorrhoids   . Diverticulosis   . Ulcer     stomach  . Gastric polyp 09/08/11    at the anastomosis-inflammatory  . Atrial fibrillation   . Chronic respiratory failure   . On home O2     qhs    Medications:  Scheduled:  . diltiazem  120 mg Oral Daily    Assessment: Patient recently diagnosed with AFIB Tonight in ED, pharmacy heparin protocol for AFIB, no bolus Most recent weight 98.2 kg Heparin dosing weight 68.9 kg Labs reviewed PTA medications reviewed  Goal of Therapy:  Heparin level 0.3-0.7 units/ml Monitor platelets by anticoagulation  protocol: Yes   Plan:  Start heparin infusion at 950 units/hr Check anti-Xa level in 8 hours and daily while on heparin Continue to monitor H&H and platelets  Azharia Surratt [employer] 07/26/2013,9:04 PM

## 2013-07-26 NOTE — ED Notes (Signed)
Pt co rt sided chest pain, started upon awakening. Pt diagnosed recently with new onset of A-fib and placed on medication for same.

## 2013-07-26 NOTE — ED Notes (Signed)
Pt states she is unable to ambulate, HR consistently 120 or higher lying down, EDP aware.

## 2013-07-26 NOTE — H&P (Signed)
PATIENT DETAILS Name: [name] Age: 80 y.o. Sex: female Date of Birth: June 02, 1928 Admit Date: 07/26/2013 [id number], [facility], MD   CHIEF COMPLAINT:  Chest pain  HPI: [name] is a 80 y.o. female with a Past Medical History of COPD on nocturnal oxygen, hypertension, rheumatoid arthritis on chronic prednisone and methotrexate therapy, History of atrial fibrillation who is his discharge from this facility a few days back who presents today with the above noted complaint. His note, patient is sleepy and is not interested 7 The patient, she was in her usual state of health until early this afternoon when she started having left-sided chest pain. Patient describes the pain as "an elephant sitting on my chest", around a 9/10 at its worst, without any radiation, no associated nausea, vomiting or shortness of breath. She was brought to the hospital and found to be in atrial fibrillation with rapid ventricular response. I was subsequently asked to admit this patient for further evaluation and treatment During my evaluation, patient's heart rate was persistently in the 130s-140s range, rhythm was atrial fibrillation. She was complaining of left-sided chest pain that was persistent a month she was given 5 mg of IV Lopressor, heart rate came down to the low 100s range, chest pain significantly improved following that. Patient denies any headache, fever, cough. She denies any nausea or vomiting. She denies any abdominal pain. She claims that she is significantly weak, and is currently unable to ambulate.  ALLERGIES:   Allergies  Allergen Reactions  . Other     Band Aids   . Tape   . Lipitor [Atorvastatin Calcium] Rash and Other (See Comments)    Muscle pain  . Niaspan [Niacin Er] Rash and Other (See Comments)    Muscle pain    PAST MEDICAL HISTORY: Past Medical History  Diagnosis Date  . Anemia   . Back pain   . COPD (chronic obstructive pulmonary disease)   . Depression    . Fibromyalgia   . Hypercholesteremia   . Hypertension   . Insomnia   . Rheumatoid arteritis   . Cancer     Skin   . Vitamin D deficiency   . 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 Generalized anxiety disorder   . GERD (gastroesophageal reflux disease)   . Bacterial pneumonia   . Fatty liver   . Internal hemorrhoids   . Diverticulosis   . Ulcer     stomach  . Gastric polyp 09/08/11    at the anastomosis-inflammatory  . Atrial fibrillation   . Chronic respiratory failure   . On home O2     qhs    PAST SURGICAL HISTORY: Past Surgical History  Procedure Laterality Date  . Abdominal surgery      removed patial stomach from ulcer  . Abdominal hysterectomy    . Colonoscopy  04-2001    internal hemorrhoids, panocolonic diverticulosis, and colon polyps   . Upper gastrointestinal endoscopy      MEDICATIONS AT HOME: Prior to Admission medications   Medication Sig Start Date End Date Taking? Authorizing Provider  albuterol (PROAIR HFA) 108 (90 BASE) MCG/ACT inhaler Inhale 2 puffs into the lungs every 6 (six) hours as needed. For rescue   Yes Historical Provider, MD  aspirin EC 325 MG EC tablet Take 1 tablet (325 mg total) by mouth daily. 07/24/13  Yes Belkys A Regalado, MD  Cholecalciferol (VITAMIN D3) 1000 UNITS CAPS Take 1 capsule by mouth daily.  Yes Historical Provider, MD  cyanocobalamin 100 MCG tablet Take 100 mcg by mouth daily.   Yes Historical Provider, MD  diltiazem (CARDIZEM CD) 120 MG 24 hr capsule Take 1 capsule (120 mg total) by mouth 2 (two) times daily. 07/24/13  Yes Belkys A Regalado, MD  DULoxetine (CYMBALTA) 60 MG capsule Take 60 mg by mouth daily.     Yes Historical Provider, MD  ferrous gluconate (FERGON) 325 MG tablet Take 325 mg by mouth daily.    Yes Historical Provider, MD  Fluticasone-Salmeterol (ADVAIR DISKUS) 250-50 MCG/DOSE AEPB Inhale 1 puff into the lungs every 12 (twelve) hours.   Yes Historical Provider, MD  folic acid (FOLVITE) 1 MG tablet  Take 1 mg by mouth daily.   Yes Historical Provider, MD  methotrexate (RHEUMATREX) 2.5 MG tablet Take 12.5 mg by mouth once a week. *Taken on Thursdays at 1200Caution:Chemotherapy. Protect from light.   Yes Historical Provider, MD  oxyCODONE-acetaminophen (PERCOCET) 7.5-325 MG per tablet Take 1 tablet by mouth 4 (four) times daily as needed. 06/18/13  Yes Historical Provider, MD  pantoprazole (PROTONIX) 40 MG tablet Take 40 mg by mouth 2 (two) times daily.    Yes Historical Provider, MD  polyethylene glycol (MIRALAX / GLYCOLAX) packet Take 17 g by mouth daily as needed for mild constipation or moderate constipation.    Yes Historical Provider, MD  predniSONE (DELTASONE) 5 MG tablet Take 5 mg by mouth daily.     Yes Historical Provider, MD  pregabalin (LYRICA) 50 MG capsule Take 50 mg by mouth 2 (two) times daily.    Yes Historical Provider, MD  Probiotic Product (PROBIOTIC FORMULA PO) Take 1 tablet by mouth daily.    Yes Historical Provider, MD  raloxifene (EVISTA) 60 MG tablet Take 60 mg by mouth daily.   Yes Historical Provider, MD  rosuvastatin (CRESTOR) 20 MG tablet Take 20 mg by mouth daily.    Yes Historical Provider, MD  vitamin C (ASCORBIC ACID) 500 MG tablet Take 500 mg by mouth daily.   Yes Historical Provider, MD  LORazepam (ATIVAN) 0.5 MG tablet Take 0.5 mg by mouth every 6 (six) hours as needed for anxiety.     Historical Provider, MD    FAMILY HISTORY: Family History  Problem Relation Age of Onset  . Colon cancer Daughter 66  . Heart disease Father   . Kidney disease Mother     kidney cancer    SOCIAL HISTORY:  reports that she has never smoked. She has never used smokeless tobacco. She reports that she does not drink alcohol or use illicit drugs.  REVIEW OF SYSTEMS:  Constitutional:   No  weight loss, night sweats,  Fevers, chills, fatigue.  HEENT:    No headaches, Difficulty swallowing,Tooth/dental problems,Sore throat,  No sneezing, itching, ear ache, nasal congestion,  post nasal drip,   Cardio-vascular: No Orthopnea, PND, swelling in lower extremities, anasarca,   dizziness, palpitations  GI:  No heartburn, indigestion, abdominal pain, nausea, vomiting, diarrhea, change in       bowel habits, loss of appetite  Resp: No shortness of breath with exertion or at rest.  No excess mucus, no productive cough, No non-productive cough,  No coughing up of blood.No change in color of mucus.No wheezing.No chest wall deformity  Skin:  no rash or lesions.  GU:  no dysuria, change in color of urine, no urgency or frequency.  No flank pain.  Musculoskeletal: No joint pain or swelling.  No decreased range of motion.  No  back pain.  Psych: No change in mood or affect. No depression or anxiety.  No memory loss.   PHYSICAL EXAM: Blood pressure 108/68, pulse 122, temperature 97.9 F (36.6 C), temperature source Oral, resp. rate 21, SpO2 92.00%.  General appearance :Awake, alert, in mild distress due to pain. Speech Clear. Not toxic Looking HEENT: Atraumatic and Normocephalic, pupils equally reactive to light and accomodation Neck: supple, no JVD. No cervical lymphadenopathy.  Chest:Good air entry bilaterally, no added sounds  CVS: S1 S2 irregular and tachycardic Abdomen: Bowel sounds present, Non tender and not distended with no gaurding, rigidity or rebound. Extremities: B/L Lower Ext shows no edema, both legs are warm to touch Neurology: Awake alert, and oriented X 3, CN II-XII intact, Non focal Skin:No Rash Wounds:N/A  LABS ON ADMISSION:   Recent Labs  07/24/13 0616 07/26/13 1423  NA 143 138  K 4.1 3.9  CL 104 99  CO2 29 27  GLUCOSE 146* 148*  BUN 23 24*  CREATININE 1.27* 1.14*  CALCIUM 10.6* 9.9   No results found for this basename: AST, ALT, ALKPHOS, BILITOT, PROT, ALBUMIN,  in the last 72 hours No results found for this basename: LIPASE, AMYLASE,  in the last 72 hours  Recent Labs  07/26/13 1423  WBC 13.9*  HGB 14.6  HCT 45.3  MCV  96.8  PLT 204    Recent Labs  07/26/13 1400 07/26/13 1544  TROPONINI <0.30 <0.30   No results found for this basename: DDIMER,  in the last 72 hours No components found with this basename: POCBNP,    RADIOLOGIC STUDIES ON ADMISSION: Dg Chest Port 1 View  07/26/2013   CLINICAL DATA:  Chest pain  EXAM: PORTABLE CHEST - 1 VIEW  COMPARISON:  Jul 21, 2013 chest radiograph and chest CT  FINDINGS: There is no edema or consolidation. Heart is enlarged with pulmonary vascularity within normal limits. No adenopathy. No pneumothorax. No bone lesions.  IMPRESSION: Stable cardiac enlargement.  No edema or consolidation.   Electronically Signed   By: Lowella Grip M.D.   On: 07/26/2013 14:27     EKG: Independently reviewed. A. fib with RVR  ASSESSMENT AND PLAN: Present on Admission:  . Atrial fibrillation with rapid ventricular response - Heart rate during my evaluation in the 130s-140s,rate now controlled with one dose of 5 mg of IV Lopressor.  - Continue Cardizem orally, if rate becomes uncontrolled again, will start Cardizem infusion  - Spoke with cardiology on call Dr. Elias Else - who advised not to anticoagulate at this time, as the patient has been deemed not to be anticoagulation candidate on recent cardiology evaluation. Continue aspirin. Will transfer patient to [facility] for medical optimization by cardiology- second admission for the similar issues.  . Chest pain - Likely secondary to rate-related demand ischemia. Currently chest pain-free-once heart rate back down in the 90s.  - Cycle cardiac enzymes.  - Spoke with cardiology on call-Dr. Elias Else, we'll defer further workup if needed to the cardiology service.   . Chronic respiratory failure - In the home O2- nocturnal   . COPD (chronic obstructive pulmonary disease) - Currently stable without any evidence of exacerbation, lungs clear on exam   . Fibromyalgia - Continue with supportive care   . GERD  (gastroesophageal reflux disease) - PPI   . Rheumatoid arteritis - Stable, continue methotrexate and prednisone  Further plan will depend as patient's clinical course evolves and further radiologic and laboratory data become available. Patient will be monitored closely.  Above noted plan was discussed with patient and daughter-Sheila (over the phone), they were in agreement.   DVT Prophylaxis: Prophylactic Heparin  Code Status: DNR-during last admission, reconfirmed with [name] over the phone   Total time spent for admission equals 55 minutes.  Jonetta Osgood [facility] Pager [phone]  If 7PM-7AM, please contact night-coverage [email or web address] Password [facility] 07/26/2013, 8:57 PM  **Disclaimer: This note may have been dictated with voice recognition software. Similar sounding words can inadvertently be transcribed and this note may contain transcription errors which may not have been corrected upon publication of note.**

## 2013-07-26 NOTE — ED Provider Notes (Signed)
CSN: [id number]     Arrival date & time 07/26/13  1351 History   First MD Initiated Contact with Patient 07/26/13 1421     Chief Complaint  Patient presents with  . Chest Pain      HPI Pt was seen at 1420. Per pt and her family, c/o gradual onset and persistence of constant right sided chest "pain" since this morning "when I woke up."  Pt's caregiver told family that pt's "HR increased after she got worried about who was going to stay with her over the weekend." Pt states she has had "chest pain on and off ever since I was in the hospital." Pt was discharged from the hospital 2 days ago for new afib/RVR and started on several new medications. States she feels "too tired" and "can't get up." Denies palpitations, no SOB/cough, no abd pain, no N/V/D, no back pain, no falls.      Past Medical History  Diagnosis Date  . Anemia   . Back pain   . COPD (chronic obstructive pulmonary disease)   . Depression   . Fibromyalgia   . Hypercholesteremia   . Hypertension   . Insomnia   . Rheumatoid arteritis   . Cancer     Skin   . Vitamin D deficiency   . 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 Generalized anxiety disorder   . GERD (gastroesophageal reflux disease)   . Bacterial pneumonia   . Fatty liver   . Internal hemorrhoids   . Diverticulosis   . Ulcer     stomach  . Gastric polyp 09/08/11    at the anastomosis-inflammatory  . Atrial fibrillation   . Chronic respiratory failure   . On home O2     qhs   Past Surgical History  Procedure Laterality Date  . Abdominal surgery      removed patial stomach from ulcer  . Abdominal hysterectomy    . Colonoscopy  04-2001    internal hemorrhoids, panocolonic diverticulosis, and colon polyps   . Upper gastrointestinal endoscopy     Family History  Problem Relation Age of Onset  . Colon cancer Daughter 20  . Heart disease Father   . Kidney disease Mother     kidney cancer   History  Substance Use Topics  . Smoking status:  Never Smoker   . Smokeless tobacco: Never Used  . Alcohol Use: No    Review of Systems ROS: Statement: All systems negative except as marked or noted in the HPI; Constitutional: Negative for fever and chills. +generalized weakness.; ; Eyes: Negative for eye pain, redness and discharge. ; ; ENMT: Negative for ear pain, hoarseness, nasal congestion, sinus pressure and sore throat. ; ; Cardiovascular: +CP. Negative for palpitations, diaphoresis, dyspnea and peripheral edema. ; ; Respiratory: Negative for cough, wheezing and stridor. ; ; Gastrointestinal: Negative for nausea, vomiting, diarrhea, abdominal pain, blood in stool, hematemesis, jaundice and rectal bleeding. . ; ; Genitourinary: Negative for dysuria, flank pain and hematuria. ; ; Musculoskeletal: Negative for back pain and neck pain. Negative for swelling and trauma.; ; Skin: Negative for pruritus, rash, abrasions, blisters, bruising and skin lesion.; ; Neuro: Negative for headache, lightheadedness and neck stiffness. Negative for altered level of consciousness , altered mental status, extremity weakness, paresthesias, involuntary movement, seizure and syncope.      Allergies  Other; Tape; Lipitor; and Niaspan  Home Medications   Prior to Admission medications   Medication Sig Start Date End Date Taking? Authorizing Provider  diltiazem (  CARDIZEM CD) 120 MG 24 hr capsule Take 1 capsule (120 mg total) by mouth 2 (two) times daily. 07/24/13  Yes Belkys A Regalado, MD  albuterol (PROAIR HFA) 108 (90 BASE) MCG/ACT inhaler Inhale 2 puffs into the lungs every 6 (six) hours as needed. For rescue    Historical Provider, MD  aspirin EC 325 MG EC tablet Take 1 tablet (325 mg total) by mouth daily. 07/24/13   Belkys A Regalado, MD  Cholecalciferol (VITAMIN D3) 1000 UNITS CAPS Take 1 capsule by mouth daily.    Historical Provider, MD  cyanocobalamin 100 MCG tablet Take 100 mcg by mouth daily.    Historical Provider, MD  DULoxetine (CYMBALTA) 60 MG  capsule Take 60 mg by mouth daily.      Historical Provider, MD  ferrous gluconate (FERGON) 325 MG tablet Take 325 mg by mouth daily.     Historical Provider, MD  Fluticasone-Salmeterol (ADVAIR DISKUS) 250-50 MCG/DOSE AEPB Inhale 1 puff into the lungs every 12 (twelve) hours.    Historical Provider, MD  folic acid (FOLVITE) 1 MG tablet Take 1 mg by mouth daily.    Historical Provider, MD  LORazepam (ATIVAN) 0.5 MG tablet Take 0.5 mg by mouth every 6 (six) hours as needed for anxiety.     Historical Provider, MD  methotrexate (RHEUMATREX) 2.5 MG tablet Take 12.5 mg by mouth once a week. *Taken on Thursdays at 1200Caution:Chemotherapy. Protect from light.    Historical Provider, MD  oxyCODONE-acetaminophen (PERCOCET) 7.5-325 MG per tablet Take 1 tablet by mouth 4 (four) times daily as needed. 06/18/13   Historical Provider, MD  pantoprazole (PROTONIX) 40 MG tablet Take 40 mg by mouth 2 (two) times daily.     Historical Provider, MD  polyethylene glycol (MIRALAX / GLYCOLAX) packet Take 17 g by mouth as directed.    Historical Provider, MD  predniSONE (DELTASONE) 5 MG tablet Take 5 mg by mouth daily.      Historical Provider, MD  pregabalin (LYRICA) 50 MG capsule Take 50 mg by mouth 2 (two) times daily.     Historical Provider, MD  Probiotic Product (PROBIOTIC FORMULA PO) Take by mouth.    Historical Provider, MD  raloxifene (EVISTA) 60 MG tablet Take 60 mg by mouth daily.    Historical Provider, MD  rosuvastatin (CRESTOR) 20 MG tablet Take 20 mg by mouth daily.     Historical Provider, MD  vitamin C (ASCORBIC ACID) 500 MG tablet Take 500 mg by mouth daily.    Historical Provider, MD   BP 124/65  Pulse 91  Temp(Src) 98.1 F (36.7 C) (Oral)  SpO2 95% Physical Exam 1425: Physical examination:  Nursing notes reviewed; Vital signs and O2 SAT reviewed;  Constitutional: Well developed, Well nourished, Well hydrated, In no acute distress; Head:  Normocephalic, atraumatic; Eyes: EOMI, PERRL, No scleral  icterus; ENMT: Mouth and pharynx normal, Mucous membranes moist; Neck: Supple, Full range of motion, No lymphadenopathy; Cardiovascular: Irregular irregular rate and rhythm, No gallop; Respiratory: Breath sounds clear & equal bilaterally, No wheezes.  Speaking full sentences with ease, Normal respiratory effort/excursion; Chest: Nontender, Movement normal; Abdomen: Soft, Nontender, Nondistended, Normal bowel sounds; Genitourinary: No CVA tenderness; Extremities: Pulses normal, No tenderness, No edema, No calf edema or asymmetry.; Neuro: AA&Ox3, vague historian. Major CN grossly intact.  Speech clear. No gross focal motor or sensory deficits in extremities.; Skin: Color normal, Warm, Dry.   ED Course  Procedures     EKG Interpretation   Date/Time:  Friday Jul 26 2013  14:08:34 EDT Ventricular Rate:  87 PR Interval:    QRS Duration: 87 QT Interval:  442 QTC Calculation: 532 R Axis:   -3 Text Interpretation:  Atrial fibrillation Ventricular premature complex  Low voltage, precordial leads Borderline T wave abnormalities Prolonged QT  interval Confirmed by Jeneen Rinks  MD, [place] ([id number]) on 07/26/2013 2:19:18 PM      MDM  MDM Reviewed: previous chart, nursing note and vitals Reviewed previous: labs and ECG Interpretation: labs, ECG and x-ray    Results for orders placed during the hospital encounter of 07/26/13  CBC      Result Value Ref Range   WBC 13.9 (*) 4.0 - 10.5 K/uL   RBC 4.68  3.87 - 5.11 MIL/uL   Hemoglobin 14.6  12.0 - 15.0 g/dL   HCT 45.3  36.0 - 46.0 %   MCV 96.8  78.0 - 100.0 fL   MCH 31.2  26.0 - 34.0 pg   MCHC 32.2  30.0 - 36.0 g/dL   RDW 14.8  11.5 - 15.5 %   Platelets 204  150 - 400 K/uL  BASIC METABOLIC PANEL      Result Value Ref Range   Sodium 138  137 - 147 mEq/L   Potassium 3.9  3.7 - 5.3 mEq/L   Chloride 99  96 - 112 mEq/L   CO2 27  19 - 32 mEq/L   Glucose, Bld 148 (*) 70 - 99 mg/dL   BUN 24 (*) 6 - 23 mg/dL   Creatinine, Ser 1.14 (*) 0.50 - 1.10 mg/dL    Calcium 9.9  8.4 - 10.5 mg/dL   GFR calc non Af Amer 43 (*) >90 mL/min   GFR calc Af Amer 50 (*) >90 mL/min  TROPONIN I      Result Value Ref Range   Troponin I <0.30  <0.30 ng/mL  PRO B NATRIURETIC PEPTIDE      Result Value Ref Range   Pro B Natriuretic peptide (BNP) 425.0  0 - 450 pg/mL  URINALYSIS, ROUTINE W REFLEX MICROSCOPIC      Result Value Ref Range   Color, Urine YELLOW  YELLOW   APPearance CLEAR  CLEAR   Specific Gravity, Urine >1.030 (*) 1.005 - 1.030   pH 5.5  5.0 - 8.0   Glucose, UA NEGATIVE  NEGATIVE mg/dL   Hgb urine dipstick NEGATIVE  NEGATIVE   Bilirubin Urine NEGATIVE  NEGATIVE   Ketones, ur NEGATIVE  NEGATIVE mg/dL   Protein, ur NEGATIVE  NEGATIVE mg/dL   Urobilinogen, UA 0.2  0.0 - 1.0 mg/dL   Nitrite NEGATIVE  NEGATIVE   Leukocytes, UA NEGATIVE  NEGATIVE  TROPONIN I      Result Value Ref Range   Troponin I <0.30  <0.30 ng/mL   Dg Chest Port 1 View 07/26/2013   CLINICAL DATA:  Chest pain  EXAM: PORTABLE CHEST - 1 VIEW  COMPARISON:  Jul 21, 2013 chest radiograph and chest CT  FINDINGS: There is no edema or consolidation. Heart is enlarged with pulmonary vascularity within normal limits. No adenopathy. No pneumothorax. No bone lesions.  IMPRESSION: Stable cardiac enlargement.  No edema or consolidation.   Electronically Signed   By: Lowella Grip M.D.   On: 07/26/2013 14:27    1850:  Pt refuses to ambulate while in the ED. At baseline, pt was able to walk while she was in the hospital, and must because she lives alone at home, especially at night. On orthostatic VS, pt's HR increases from 90  to 140's and pt c/o generalized weakness. ED RN stated pt was unable to stand other than briefly/with assist. Pt's HR decreases again to 100-120's when back laying on stretcher again. Will dose pt's cardizem. Dx and testing d/w pt and family.  Questions answered.  Verb understanding, agreeable to observation admit.  T/C to [facility] Dr. Sloan Leiter, case discussed, including:  HPI,  pertinent PM/SHx, VS/PE, dx testing, ED course and treatment:  Agreeable to admit, requests to write temporary orders, obtain observation tele bed.     Alfonzo Feller, DO 07/28/13 2006

## 2013-07-26 NOTE — ED Notes (Signed)
Dr. Sloan Leiter in to see patient.  12 Lead obtained

## 2013-07-27 DIAGNOSIS — I4891 Unspecified atrial fibrillation: Principal | ICD-10-CM

## 2013-07-27 DIAGNOSIS — I509 Heart failure, unspecified: Secondary | ICD-10-CM

## 2013-07-27 LAB — CBC
HCT: 43.7 % (ref 36.0–46.0)
Hemoglobin: 13.8 g/dL (ref 12.0–15.0)
MCH: 31.1 pg (ref 26.0–34.0)
MCHC: 31.6 g/dL (ref 30.0–36.0)
MCV: 98.4 fL (ref 78.0–100.0)
Platelets: 213 10*3/uL (ref 150–400)
RBC: 4.44 MIL/uL (ref 3.87–5.11)
RDW: 15.1 % (ref 11.5–15.5)
WBC: 17.2 10*3/uL — ABNORMAL HIGH (ref 4.0–10.5)

## 2013-07-27 LAB — BASIC METABOLIC PANEL
BUN: 31 mg/dL — AB (ref 6–23)
CHLORIDE: 99 meq/L (ref 96–112)
CO2: 24 mEq/L (ref 19–32)
Calcium: 9.9 mg/dL (ref 8.4–10.5)
Creatinine, Ser: 1.95 mg/dL — ABNORMAL HIGH (ref 0.50–1.10)
GFR, EST AFRICAN AMERICAN: 26 mL/min — AB (ref >90)
GFR, EST NON AFRICAN AMERICAN: 23 mL/min — AB (ref >90)
GLUCOSE: 204 mg/dL — AB (ref 70–99)
Potassium: 4.5 mEq/L (ref 3.7–5.3)
Sodium: 137 mEq/L (ref 137–147)

## 2013-07-27 LAB — MRSA PCR SCREENING: MRSA by PCR: NEGATIVE

## 2013-07-27 LAB — TROPONIN I: Troponin I: <0.3 ng/mL (ref <0.30)

## 2013-07-27 LAB — GLUCOSE, CAPILLARY: GLUCOSE-CAPILLARY: 172 mg/dL — AB (ref 70–99)

## 2013-07-27 MED ORDER — GUAIFENESIN-DM 100-10 MG/5ML PO SYRP
5.0000 mL | ORAL_SOLUTION | Freq: Four times a day (QID) | ORAL | Status: DC
  Administered 2013-07-27 – 2013-07-30 (×13): 5 mL via ORAL
  Filled 2013-07-27 (×18): qty 5

## 2013-07-27 MED ORDER — HEPARIN SODIUM (PORCINE) 5000 UNIT/ML IJ SOLN
5000.0000 [IU] | Freq: Three times a day (TID) | INTRAMUSCULAR | Status: DC
  Administered 2013-07-27 – 2013-07-30 (×11): 5000 [IU] via SUBCUTANEOUS
  Filled 2013-07-27 (×13): qty 1

## 2013-07-27 MED ORDER — ALUM & MAG HYDROXIDE-SIMETH 200-200-20 MG/5ML PO SUSP: 30.0000 mL | Freq: Four times a day (QID) | ORAL | Status: DC | PRN

## 2013-07-27 MED ORDER — SODIUM CHLORIDE 0.9 % IV SOLN: 250.0000 mL | INTRAVENOUS | Status: DC | PRN

## 2013-07-27 MED ORDER — ACETAMINOPHEN 325 MG PO TABS
650.0000 mg | ORAL_TABLET | Freq: Four times a day (QID) | ORAL | Status: DC | PRN
  Administered 2013-07-27 – 2013-07-29 (×4): 650 mg via ORAL
  Filled 2013-07-27 (×4): qty 2

## 2013-07-27 MED ORDER — PREGABALIN 50 MG PO CAPS
50.0000 mg | ORAL_CAPSULE | Freq: Two times a day (BID) | ORAL | Status: DC
  Administered 2013-07-27 – 2013-07-30 (×7): 50 mg via ORAL
  Filled 2013-07-27 (×4): qty 1
  Filled 2013-07-27: qty 2
  Filled 2013-07-27: qty 1

## 2013-07-27 MED ORDER — DILTIAZEM HCL ER COATED BEADS 300 MG PO CP24
300.0000 mg | ORAL_CAPSULE | Freq: Every day | ORAL | Status: DC
  Administered 2013-07-27 – 2013-07-30 (×4): 300 mg via ORAL
  Filled 2013-07-27 (×4): qty 1

## 2013-07-27 MED ORDER — PREDNISONE 5 MG PO TABS
5.0000 mg | ORAL_TABLET | Freq: Every day | ORAL | Status: DC
Start: 2013-07-27 — End: 2013-07-30
  Administered 2013-07-27 – 2013-07-30 (×4): 5 mg via ORAL
  Filled 2013-07-27 (×5): qty 1

## 2013-07-27 MED ORDER — MORPHINE SULFATE 2 MG/ML IJ SOLN
1.0000 mg | INTRAMUSCULAR | Status: DC | PRN
  Administered 2013-07-27: 1 mg via INTRAVENOUS
  Filled 2013-07-27: qty 1

## 2013-07-27 MED ORDER — POLYETHYLENE GLYCOL 3350 17 G PO PACK
17.0000 g | PACK | Freq: Every day | ORAL | Status: DC | PRN
  Filled 2013-07-27: qty 1

## 2013-07-27 MED ORDER — VITAMIN B-12 100 MCG PO TABS
100.0000 ug | ORAL_TABLET | Freq: Every day | ORAL | Status: DC
  Administered 2013-07-27 – 2013-07-30 (×4): 100 ug via ORAL
  Filled 2013-07-27 (×4): qty 1

## 2013-07-27 MED ORDER — DILTIAZEM HCL ER COATED BEADS 120 MG PO CP24
120.0000 mg | ORAL_CAPSULE | Freq: Two times a day (BID) | ORAL | Status: DC
  Filled 2013-07-27: qty 1

## 2013-07-27 MED ORDER — RALOXIFENE HCL 60 MG PO TABS
60.0000 mg | ORAL_TABLET | Freq: Every day | ORAL | Status: DC
  Administered 2013-07-27 – 2013-07-30 (×4): 60 mg via ORAL
  Filled 2013-07-27 (×4): qty 1

## 2013-07-27 MED ORDER — LORAZEPAM 0.5 MG PO TABS: 0.5000 mg | ORAL_TABLET | Freq: Four times a day (QID) | ORAL | Status: DC | PRN

## 2013-07-27 MED ORDER — ASPIRIN EC 325 MG PO TBEC
325.0000 mg | DELAYED_RELEASE_TABLET | Freq: Every day | ORAL | Status: DC
  Administered 2013-07-27 – 2013-07-30 (×4): 325 mg via ORAL
  Filled 2013-07-27 (×4): qty 1

## 2013-07-27 MED ORDER — SODIUM CHLORIDE 0.9 % IV SOLN: INTRAVENOUS | Status: DC

## 2013-07-27 MED ORDER — ONDANSETRON HCL 4 MG PO TABS: 4.0000 mg | ORAL_TABLET | Freq: Four times a day (QID) | ORAL | Status: DC | PRN

## 2013-07-27 MED ORDER — OXYCODONE HCL 5 MG PO TABS
5.0000 mg | ORAL_TABLET | ORAL | Status: DC | PRN
  Administered 2013-07-28 (×2): 5 mg via ORAL
  Filled 2013-07-27 (×2): qty 1

## 2013-07-27 MED ORDER — SODIUM CHLORIDE 0.9 % IV SOLN
INTRAVENOUS | Status: DC
  Administered 2013-07-28: 75 mL via INTRAVENOUS

## 2013-07-27 MED ORDER — METHOTREXATE 2.5 MG PO TABS: 12.5000 mg | ORAL_TABLET | ORAL | Status: DC

## 2013-07-27 MED ORDER — LEVALBUTEROL HCL 0.63 MG/3ML IN NEBU: 0.6300 mg | INHALATION_SOLUTION | RESPIRATORY_TRACT | Status: DC | PRN

## 2013-07-27 MED ORDER — VITAMIN C 500 MG PO TABS
500.0000 mg | ORAL_TABLET | Freq: Every day | ORAL | Status: DC
  Administered 2013-07-27 – 2013-07-30 (×4): 500 mg via ORAL
  Filled 2013-07-27 (×4): qty 1

## 2013-07-27 MED ORDER — ONDANSETRON HCL 4 MG/2ML IJ SOLN
4.0000 mg | Freq: Four times a day (QID) | INTRAMUSCULAR | Status: DC | PRN
  Administered 2013-07-27: 4 mg via INTRAVENOUS
  Filled 2013-07-27: qty 2

## 2013-07-27 MED ORDER — ACETAMINOPHEN 650 MG RE SUPP: 650.0000 mg | Freq: Four times a day (QID) | RECTAL | Status: DC | PRN

## 2013-07-27 MED ORDER — SODIUM CHLORIDE 0.9 % IJ SOLN: 3.0000 mL | INTRAMUSCULAR | Status: DC | PRN

## 2013-07-27 MED ORDER — SODIUM CHLORIDE 0.9 % IV BOLUS (SEPSIS)
250.0000 mL | Freq: Once | INTRAVENOUS | Status: AC
  Administered 2013-07-27: 250 mL via INTRAVENOUS

## 2013-07-27 MED ORDER — ATORVASTATIN CALCIUM 40 MG PO TABS
40.0000 mg | ORAL_TABLET | Freq: Every day | ORAL | Status: DC
Start: 2013-07-27 — End: 2013-07-30
  Administered 2013-07-27 – 2013-07-29 (×3): 40 mg via ORAL
  Filled 2013-07-27 (×4): qty 1

## 2013-07-27 MED ORDER — FOLIC ACID 1 MG PO TABS
1.0000 mg | ORAL_TABLET | Freq: Every day | ORAL | Status: DC
  Administered 2013-07-27 – 2013-07-30 (×4): 1 mg via ORAL
  Filled 2013-07-27 (×4): qty 1

## 2013-07-27 MED ORDER — PANTOPRAZOLE SODIUM 40 MG PO TBEC
40.0000 mg | DELAYED_RELEASE_TABLET | Freq: Two times a day (BID) | ORAL | Status: DC
  Administered 2013-07-27 – 2013-07-30 (×7): 40 mg via ORAL
  Filled 2013-07-27 (×6): qty 1

## 2013-07-27 MED ORDER — FERROUS GLUCONATE 324 (38 FE) MG PO TABS
325.0000 mg | ORAL_TABLET | Freq: Every day | ORAL | Status: DC
  Administered 2013-07-27 – 2013-07-30 (×4): 325 mg via ORAL
  Filled 2013-07-27 (×5): qty 1

## 2013-07-27 MED ORDER — GUAIFENESIN-DM 100-10 MG/5ML PO SYRP: 5.0000 mL | ORAL_SOLUTION | ORAL | Status: DC | PRN

## 2013-07-27 MED ORDER — SODIUM CHLORIDE 0.9 % IJ SOLN
3.0000 mL | Freq: Two times a day (BID) | INTRAMUSCULAR | Status: DC
  Administered 2013-07-27 – 2013-07-30 (×6): 3 mL via INTRAVENOUS

## 2013-07-27 MED ORDER — SODIUM CHLORIDE 0.9 % IJ SOLN
3.0000 mL | Freq: Two times a day (BID) | INTRAMUSCULAR | Status: DC
  Administered 2013-07-27: 3 mL via INTRAVENOUS

## 2013-07-27 MED ORDER — DULOXETINE HCL 60 MG PO CPEP
60.0000 mg | ORAL_CAPSULE | Freq: Every day | ORAL | Status: DC
  Administered 2013-07-27 – 2013-07-30 (×4): 60 mg via ORAL
  Filled 2013-07-27 (×4): qty 1

## 2013-07-27 MED ORDER — MOMETASONE FURO-FORMOTEROL FUM 100-5 MCG/ACT IN AERO
2.0000 | INHALATION_SPRAY | Freq: Two times a day (BID) | RESPIRATORY_TRACT | Status: DC
Start: 2013-07-27 — End: 2013-07-30
  Administered 2013-07-27 – 2013-07-30 (×7): 2 via RESPIRATORY_TRACT
  Filled 2013-07-27: qty 8.8

## 2013-07-27 MED ORDER — ALBUTEROL SULFATE (2.5 MG/3ML) 0.083% IN NEBU: 2.5000 mg | INHALATION_SOLUTION | RESPIRATORY_TRACT | Status: DC | PRN

## 2013-07-27 NOTE — Progress Notes (Signed)
[place] TEAM 1 - Stepdown/ICU TEAM Progress Note  [name] [id number] DOB: April 18, 1928 DOA: 07/26/2013 PCP: Delphina Cahill, MD  Admit HPI / Brief Narrative: 80 yo F with a hx of COPD on nocturnal oxygen, hypertension, rheumatoid arthritis on chronic prednisone/methotrexate therapy, chronic atrial fibrillation who was discharged from [facility] a few days prior to this readmit who presented with a c/o cp. Reportedly she was in her usual state of health until early the afternoon of her admit when she started having left-sided chest pain. Patient described the pain as "an elephant sitting on my chest", around a 9/10 at its worst, without any radiation, no associated nausea, vomiting or shortness of breath. She was brought to the hospital and found to be in atrial fibrillation with rapid ventricular response.   HPI/Subjective: Pt is distracted during my visit, but has no new complaints.  She if fixated on the idea that she "is just falling apart."    Assessment/Plan:  Recently diagnosed Chronic atrial fibrillation with acute rapid ventricular response patient has been deemed not to be anticoagulation candidate on recent cardiology evaluation - prior workup included negative troponins, normal TSH, negative PE CT and no sig valvular dz, nl EF on TTE ([facility] 5/10-13) - severely dilated LA noted on recent TTE - Cards following   Chest pain On both this and prior admit pain has coincided w/ episodes of RVR - Cards to comment   Acute renal failure Cr 1.9 today, which is a significant decline in crt - renal insuff was noted during prior admit, leading to stopping of ACE - crt previously peaked at 1.3 - hydrate - avoid nephrotoxins - follow   HTN BP currently well controlled  Chronic hypoxic resp failure - COPD Requires home oxygen each night, and with exertion during day - some productive cough at present - no signif wheeze   Fibromyalgia  GERD  Rheumatoid arteritis on chronic steroid tx No  need for stress dose steroids at present   Hyperglycemia A1c noted to be 6.8 07/21/13 - no prior hx of DM - discussed new diagnosis w/ pt and daughter at length - begin education - restrict diet   Code Status: NO CODE Family Communication: spoke w/ daughter at bedside at length  Disposition Plan: SDU until RVR controlled   Consultants: Cardiology  Procedures: none  Antibiotics: none  DVT prophylaxis: SQ heparin   Objective: Blood pressure 123/65, pulse 87, temperature 99.7 F (37.6 C), temperature source Oral, resp. rate 21, height 5\' 2"  (1.575 m), weight 97.2 kg (214 lb 4.6 oz), SpO2 93.00%.  Intake/Output Summary (Last 24 hours) at 07/27/13 1114 Last data filed at 07/27/13 0900  Gross per 24 hour  Intake    200 ml  Output    200 ml  Net      0 ml   Exam: General: No acute respiratory distress at rest in bed  Lungs: Clear to auscultation bilaterally without wheezes or crackles - occasional productive cough noted  Cardiovascular: Regular rate without murmur gallop or rub Abdomen: Nontender, soft, bowel sounds positive, no rebound, no ascites, no appreciable mass Extremities: No significant cyanosis, clubbing, or edema bilateral lower extremities  Data Reviewed: Basic Metabolic Panel:  Recent Labs Lab 07/21/13 0805 07/22/13 0512 07/23/13 0548 07/24/13 0616 07/26/13 1423 07/27/13 0120  NA 143 144 142 143 138 137  K 3.5* 3.6* 3.9 4.1 3.9 4.5  CL 101 102 104 104 99 99  CO2 30 30 29 29 27 24   GLUCOSE 140*  156* 179* 146* 148* 204*  BUN 12 15 15 23  24* 31*  CREATININE 1.03 1.01 1.04 1.27* 1.14* 1.95*  CALCIUM 9.7 10.1 10.1 10.6* 9.9 9.9  MG 1.7  --   --   --   --   --    Liver Function Tests:  Recent Labs Lab 07/22/13 0512  AST 27  ALT 26  ALKPHOS 59  BILITOT 1.1  PROT 6.4  ALBUMIN 3.2*   CBC:  Recent Labs Lab 07/21/13 0805 07/22/13 0512 07/23/13 0548 07/26/13 1423 07/27/13 0120  WBC 10.3 11.2* 8.2 13.9* 17.2*  HGB 14.3 14.5 13.2 14.6 13.8    HCT 44.6 44.7 40.9 45.3 43.7  MCV 97.2 96.5 96.5 96.8 98.4  PLT 128* 148* 172 204 213   Cardiac Enzymes:  Recent Labs Lab 07/26/13 1400 07/26/13 1544 07/26/13 2109 07/27/13 0119 07/27/13 0836  TROPONINI <0.30 <0.30 <0.30 <0.30 <0.30   BNP (last 3 results)  Recent Labs  07/21/13 0805 07/26/13 1400  PROBNP 458.1* 425.0   CBG:  Recent Labs Lab 07/26/13 2117 07/27/13 0730  GLUCAP 236* 172*    Recent Results (from the past 240 hour(s))  MRSA PCR SCREENING     Status: None   Collection Time    07/21/13 12:50 PM      Result Value Ref Range Status   MRSA by PCR NEGATIVE  NEGATIVE Final   Comment:            The GeneXpert MRSA Assay (FDA     approved for NASAL specimens     only), is one component of a     comprehensive MRSA colonization     surveillance program. It is not     intended to diagnose MRSA     infection nor to guide or     monitor treatment for     MRSA infections.  MRSA PCR SCREENING     Status: None   Collection Time    07/27/13 12:54 AM      Result Value Ref Range Status   MRSA by PCR NEGATIVE  NEGATIVE Final   Comment:            The GeneXpert MRSA Assay (FDA     approved for NASAL specimens     only), is one component of a     comprehensive MRSA colonization     surveillance program. It is not     intended to diagnose MRSA     infection nor to guide or     monitor treatment for     MRSA infections.     Studies:  Recent x-ray studies have been reviewed in detail by the Attending Physician  Scheduled Meds:  Scheduled Meds: . aspirin EC  325 mg Oral Daily  . atorvastatin  40 mg Oral q1800  . diltiazem  300 mg Oral Daily  . DULoxetine  60 mg Oral Daily  . ferrous gluconate  325 mg Oral Q breakfast  . folic acid  1 mg Oral Daily  . heparin subcutaneous  5,000 Units Subcutaneous 3 times per day  . [START ON 08/01/2013] methotrexate  12.5 mg Oral Q Thu  . mometasone-formoterol  2 puff Inhalation BID  . pantoprazole  40 mg Oral BID   . predniSONE  5 mg Oral Q breakfast  . pregabalin  50 mg Oral BID  . raloxifene  60 mg Oral Daily  . sodium chloride  3 mL Intravenous Q12H  . sodium chloride  3 mL Intravenous  Q12H  . cyanocobalamin  100 mcg Oral Daily  . vitamin C  500 mg Oral Daily    Time spent on care of this patient: 35 mins   Cherene Altes , MD   [facility] Office  [phone] Pager - Text Page per Amion as per below:  On-Call/Text Page:      [email or web address]      password [email or web address]  If 7PM-7AM, please contact night-coverage [email or web address] Password [email or web address] 07/27/2013, 11:14 AM   LOS: 1 day

## 2013-07-27 NOTE — Consult Note (Signed)
CARDIOLOGY CONSULT NOTE  Patient ID: [name], MRN: [id number], DOB/AGE: 78-May-1930 78 y.o. Admit date: 07/26/2013 Date of Consult: 07/27/2013  Primary Physician: Delphina Cahill, MD Primary Cardiologist: seen by Dr. Harl Bowie at [facility]  Chief Complaint: chest pain Reason for Consultation: afib with RVR  HPI: 80 y.o. female w/ PMHx significant for HTN, hyperlipidema who presented initially to [facility] and then transferred to to [facility] on 07/27/2013 with complaints of chest pain. Pt was recently admitted to [facility] with chest pain on 5/10 and was found to be in afib with RVR. On diltiazem gtt and symptoms resolved with better rate control. Workup include echo (dilated LA), negative pulm CTA and serial troponins (negative), nl TSH. Discharged on diltiazem and just aspirin for anticoagulation due to possible risk of falls though further anticoagulation to be re-addressed.  On day of presentation, on onset of chest pain. Found to be in afib with RVR at [facility]. Given 5 mg of IV metoprolol and rates improved to the 100s and symptoms improved.  Currently, reports no symptoms of chest pain. Sleeping. Reports that at home, chest pain worsened on day of admission which is why she re-presented. Tolerating diltizem without problems.    Past Medical History  Diagnosis Date  . Anemia   . Back pain   . COPD (chronic obstructive pulmonary disease)   . Depression   . Fibromyalgia   . Hypercholesteremia   . Hypertension   . Insomnia   . Rheumatoid arteritis   . Cancer     Skin   . Vitamin D deficiency   . 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 Generalized anxiety disorder   . GERD (gastroesophageal reflux disease)   . Bacterial pneumonia   . Fatty liver   . Internal hemorrhoids   . Diverticulosis   . Ulcer     stomach  . Gastric polyp 09/08/11    at the anastomosis-inflammatory  . Atrial fibrillation   . Chronic respiratory failure   . On home O2     qhs       Surgical History:  Past Surgical History  Procedure Laterality Date  . Abdominal surgery      removed patial stomach from ulcer  . Abdominal hysterectomy    . Colonoscopy  04-2001    internal hemorrhoids, panocolonic diverticulosis, and colon polyps   . Upper gastrointestinal endoscopy       Home Meds: Prior to Admission medications   Medication Sig Start Date End Date Taking? Authorizing Provider  albuterol (PROAIR HFA) 108 (90 BASE) MCG/ACT inhaler Inhale 2 puffs into the lungs every 6 (six) hours as needed. For rescue   Yes Historical Provider, MD  aspirin EC 325 MG EC tablet Take 1 tablet (325 mg total) by mouth daily. 07/24/13  Yes Belkys A Regalado, MD  Cholecalciferol (VITAMIN D3) 1000 UNITS CAPS Take 1 capsule by mouth daily.   Yes Historical Provider, MD  cyanocobalamin 100 MCG tablet Take 100 mcg by mouth daily.   Yes Historical Provider, MD  diltiazem (CARDIZEM CD) 120 MG 24 hr capsule Take 1 capsule (120 mg total) by mouth 2 (two) times daily. 07/24/13  Yes Belkys A Regalado, MD  DULoxetine (CYMBALTA) 60 MG capsule Take 60 mg by mouth daily.     Yes Historical Provider, MD  ferrous gluconate (FERGON) 325 MG tablet Take 325 mg by mouth daily.    Yes Historical Provider, MD  Fluticasone-Salmeterol (ADVAIR DISKUS) 250-50 MCG/DOSE AEPB Inhale 1 puff into  the lungs every 12 (twelve) hours.   Yes Historical Provider, MD  folic acid (FOLVITE) 1 MG tablet Take 1 mg by mouth daily.   Yes Historical Provider, MD  methotrexate (RHEUMATREX) 2.5 MG tablet Take 12.5 mg by mouth once a week. *Taken on Thursdays at 1200Caution:Chemotherapy. Protect from light.   Yes Historical Provider, MD  oxyCODONE-acetaminophen (PERCOCET) 7.5-325 MG per tablet Take 1 tablet by mouth 4 (four) times daily as needed. 06/18/13  Yes Historical Provider, MD  pantoprazole (PROTONIX) 40 MG tablet Take 40 mg by mouth 2 (two) times daily.    Yes Historical Provider, MD  polyethylene glycol (MIRALAX / GLYCOLAX) packet  Take 17 g by mouth daily as needed for mild constipation or moderate constipation.    Yes Historical Provider, MD  predniSONE (DELTASONE) 5 MG tablet Take 5 mg by mouth daily.     Yes Historical Provider, MD  pregabalin (LYRICA) 50 MG capsule Take 50 mg by mouth 2 (two) times daily.    Yes Historical Provider, MD  Probiotic Product (PROBIOTIC FORMULA PO) Take 1 tablet by mouth daily.    Yes Historical Provider, MD  raloxifene (EVISTA) 60 MG tablet Take 60 mg by mouth daily.   Yes Historical Provider, MD  rosuvastatin (CRESTOR) 20 MG tablet Take 20 mg by mouth daily.    Yes Historical Provider, MD  vitamin C (ASCORBIC ACID) 500 MG tablet Take 500 mg by mouth daily.   Yes Historical Provider, MD  LORazepam (ATIVAN) 0.5 MG tablet Take 0.5 mg by mouth every 6 (six) hours as needed for anxiety.     Historical Provider, MD    Inpatient Medications:  . aspirin EC  325 mg Oral Daily  . atorvastatin  40 mg Oral q1800  . diltiazem  120 mg Oral BID  . DULoxetine  60 mg Oral Daily  . ferrous gluconate  325 mg Oral Q breakfast  . folic acid  1 mg Oral Daily  . heparin subcutaneous  5,000 Units Subcutaneous 3 times per day  . [START ON 08/01/2013] methotrexate  12.5 mg Oral Q Thu  . mometasone-formoterol  2 puff Inhalation BID  . pantoprazole  40 mg Oral BID  . predniSONE  5 mg Oral Q breakfast  . pregabalin  50 mg Oral BID  . raloxifene  60 mg Oral Daily  . sodium chloride  3 mL Intravenous Q12H  . sodium chloride  3 mL Intravenous Q12H  . cyanocobalamin  100 mcg Oral Daily  . vitamin C  500 mg Oral Daily      Allergies:  Allergies  Allergen Reactions  . Other     Band Aids   . Tape   . Lipitor [Atorvastatin Calcium] Rash and Other (See Comments)    Muscle pain  . Niaspan [Niacin Er] Rash and Other (See Comments)    Muscle pain    History   Social History  . Marital Status: Widowed    Spouse Name: N/A    Number of Children: 6  . Years of Education: N/A   Occupational History  .  Retired    Social History Main Topics  . Smoking status: Never Smoker   . Smokeless tobacco: Never Used  . Alcohol Use: No  . Drug Use: No  . Sexual Activity: Not on file   Other Topics Concern  . Not on file   Social History Narrative   Daily caffeine      Family History  Problem Relation Age of Onset  .  Colon cancer Daughter 61  . Heart disease Father   . Kidney disease Mother     kidney cancer     Review of Systems: General: negative for chills, fever, night sweats or weight changes.  Cardiovascular: see HPI Dermatological: negative for rash, +bruising Respiratory: negative for cough or wheezing Urologic: negative for hematuria Abdominal: negative for nausea, vomiting, diarrhea, bright red blood per rectum, melena, or hematemesis Neurologic: negative for visual changes, syncope, or dizziness All other systems reviewed and are otherwise negative except as noted above.  Labs:  Recent Labs  07/26/13 1400 07/26/13 1544 07/26/13 2109 07/27/13 0119  TROPONINI <0.30 <0.30 <0.30 <0.30   Lab Results  Component Value Date   WBC 17.2* 07/27/2013   HGB 13.8 07/27/2013   HCT 43.7 07/27/2013   MCV 98.4 07/27/2013   PLT 213 07/27/2013    Recent Labs Lab 07/22/13 0512  07/27/13 0120  NA 144  < > 137  K 3.6*  < > 4.5  CL 102  < > 99  CO2 30  < > 24  BUN 15  < > 31*  CREATININE 1.01  < > 1.95*  CALCIUM 10.1  < > 9.9  PROT 6.4  --   --   BILITOT 1.1  --   --   ALKPHOS 59  --   --   ALT 26  --   --   AST 27  --   --   GLUCOSE 156*  < > 204*  < > = values in this interval not displayed. No results found for this basename: CHOL, HDL, LDLCALC, TRIG    Radiology/Studies:  Ct Angio Chest Pe W/cm &/or Wo Cm  07/22/2013   CLINICAL DATA:  Chest pain, shortness of breath, tachycardia.  EXAM: CT ANGIOGRAPHY CHEST WITH CONTRAST  TECHNIQUE: Multidetector CT imaging of the chest was performed using the standard protocol during bolus administration of intravenous contrast.  Multiplanar CT image reconstructions and MIPs were obtained to evaluate the vascular anatomy.  CONTRAST:  169mL OMNIPAQUE IOHEXOL 350 MG/ML SOLN  COMPARISON:  Chest CT 10/06/2009 and 07/23/2009.  FINDINGS: No filling defects in the pulmonary arteries to suggest pulmonary emboli. Heart is mildly enlarged. Aortic calcifications without aneurysm or dissection. No mediastinal, hilar, or axillary adenopathy. Chest wall soft tissues are unremarkable.  Minimal dependent atelectasis in the lung bases bilaterally. Lungs are otherwise clear. No pleural effusions.  Imaging into the upper abdomen shows no acute findings. Mild degenerative spurring throughout the thoracic spine.  Review of the MIP images confirms the above findings.  IMPRESSION: No evidence of pulmonary embolus.  Mild cardiomegaly.  Dependent bibasilar atelectasis.   Electronically Signed   By: Rolm Baptise M.D.   On: 07/22/2013 10:02   Dg Chest Port 1 View  07/26/2013   CLINICAL DATA:  Chest pain  EXAM: PORTABLE CHEST - 1 VIEW  COMPARISON:  Jul 21, 2013 chest radiograph and chest CT  FINDINGS: There is no edema or consolidation. Heart is enlarged with pulmonary vascularity within normal limits. No adenopathy. No pneumothorax. No bone lesions.  IMPRESSION: Stable cardiac enlargement.  No edema or consolidation.   Electronically Signed   By: Lowella Grip M.D.   On: 07/26/2013 14:27   Dg Chest Port 1 View  07/21/2013   CLINICAL DATA:  Chest pain weakness  EXAM: PORTABLE CHEST - 1 VIEW  COMPARISON:  DG CHEST 2 VIEW dated 10/16/2011; DG CHEST 2 VIEW dated 05/18/2011; CT ABDOMEN W/CM dated 01/07/2011  FINDINGS: Moderate cardiac  enlargement. Mild vascular congestion. Mild perihilar peribronchial wall thickening and mild interstitial prominence.  IMPRESSION: Cardiac enlargement and vascular congestion. Suspect mild interstitial pulmonary edema.   Electronically Signed   By: Skipper Cliche M.D.   On: 07/21/2013 08:40    EKG: afib with rates in the 80s.    Physical Exam: Blood pressure 100/79, pulse 96, temperature 99.1 F (37.3 C), temperature source Oral, resp. rate 20, height 5\' 2"  (1.575 m), weight 97.2 kg (214 lb 4.6 oz), SpO2 94.00%. General: Well developed, well nourished, in no acute distress. Head: Normocephalic, atraumatic, sclera non-icteric, no xanthomas, nares are without discharge.  Neck: Supple. Negative for carotid bruits. JVD not elevated. Lungs: Clear bilaterally to auscultation without wheezes, rales, or rhonchi. Breathing is unlabored. Heart: irreg, irreg. No murmurs, rubs, or gallops appreciated. Abdomen: Soft, non-tender, non-distended with normoactive bowel sounds. No hepatomegaly. No rebound/guarding. No obvious abdominal masses. Msk:  Strength and tone appear normal for age. Extremities: No clubbing or cyanosis. No edema.  Distal pedal pulses are 2+ and equal bilaterally. Neuro: Alert and oriented X 3. Moves all extremities spontaneously. Psych:  Responds to questions appropriately with a normal affect.   Assessment and Plan:  Problem List 1. Atrial fibrillation, symptomatic with RVR 2. CHF with pEF, euvolemic 3. Acute renal failure, Cr 1.9 4. Elevated WBC. 5. De-conditioned  80 y.o. female w/ PMHx significant for HTN, hyperlipidema and recent new diagnosis of afib who presented initially to [facility] and then transferred to to [facility] on 07/27/2013 with complaints of chest pain and found to be in RVR again.   Prior workup included negative troponins, negative PE CT and no sig valvular dz, nl EF of echo.  Symptoms of chest pain seem to be clearly associated with episodes of RVR. When controlled in the 80s to 90s, appears to be chest pain free but with ambulation, rates may increase leading to symptoms. Will gently increase diltiazem from 240 to 300 qday (ordered). Re-assess rate response with ambulation. Based upon echo report of large left atrium, rate control approach favored. Anti-coagulation to be  addressed in the future as there were concerns of falls given age, history of arthritis, easy bruising. Continue aspirin.  Acute renal failure, ?pre-renal. Recheck in the AM.  Elevated WBC: not localizing. On chronic steroids.  Deconditioned. Anticipate need for PT/OT.  Thank you for this consult. Will follow up in the AM. Please call with questions.   Kassie Mends MD 07/27/2013, 4:06 AM

## 2013-07-27 NOTE — Progress Notes (Signed)
Dr.Whitlock paged and notified of patient's arrival to unit.

## 2013-07-27 NOTE — Progress Notes (Signed)
SUBJECTIVE:  Ms. Picone was seen and examined at bedside this morning. She was transferred [facility] to [facility] this morning, and initially admitted to [facility] on 07/26/13 for chest pain after similar admission on 07/21/13 for chest pain and afib rvr controlled with dilt gtt and discharged on asa.   She returned yesterday with complaints of worsening chest pain, treated with 5mg  IV metoprolol with improved rate and symptoms. We are consulted for further management of afib with rvr.   Today, her HR is improved <100, no chest pain except when she coughs.  She reports sleeping well.  Her daughter is present in the room as well who says she has been very short of breath with walking lately and does not always use her walker which can lead to falls. Afib is noted to be new these past hospitalizations. Daughter is HCPOA and reports patient to be DNR and that is included in the living will as well. Ms Belveal lives alone but refuses SNF or living with family members. She does have an aide that comes to the come  [place]:   07/27/13 0500 07/27/13 0600 07/27/13 0730 07/27/13 0903  BP: 109/54 106/61 123/65   Pulse: 83 95 87   Temp:   99.7 F (37.6 C)   TempSrc:   Oral   Resp: 25 17 21    Height:      Weight:      SpO2: 90% 94% 93% 93%   General:  Lying in bed, NAD, on [place] O2 Lungs:  Coarse breath sounds but no wheezing. Coughing with deep inspiration  Heart:  Irregularly irregular, murmur heard LUSB Abdomen:  Soft, obese, non-tender to palpation, +bs Extremities:  Moving all 4 extremities, tender to palpation, trace edema, scattered ecchymosis   LABS: Lab Results  Component Value Date   TROPONINI <0.30 07/27/2013   Results for orders placed during the hospital encounter of 07/26/13 (from the past 24 hour(s))  TROPONIN I     Status: None   Collection Time    07/26/13  2:00 PM      Result Value Ref Range   Troponin I <0.30  <0.30 ng/mL  PRO B NATRIURETIC PEPTIDE     Status: None   Collection Time    07/26/13  2:00 PM      Result Value Ref Range   Pro B Natriuretic peptide (BNP) 425.0  0 - 450 pg/mL  CBC     Status: Abnormal   Collection Time    07/26/13  2:23 PM      Result Value Ref Range   WBC 13.9 (*) 4.0 - 10.5 K/uL   RBC 4.68  3.87 - 5.11 MIL/uL   Hemoglobin 14.6  12.0 - 15.0 g/dL   HCT 45.3  36.0 - 46.0 %   MCV 96.8  78.0 - 100.0 fL   MCH 31.2  26.0 - 34.0 pg   MCHC 32.2  30.0 - 36.0 g/dL   RDW 14.8  11.5 - 15.5 %   Platelets 204  150 - 400 K/uL  BASIC METABOLIC PANEL     Status: Abnormal   Collection Time    07/26/13  2:23 PM      Result Value Ref Range   Sodium 138  137 - 147 mEq/L   Potassium 3.9  3.7 - 5.3 mEq/L   Chloride 99  96 - 112 mEq/L   CO2 27  19 - 32 mEq/L   Glucose, Bld 148 (*) 70 -  99 mg/dL   BUN 24 (*) 6 - 23 mg/dL   Creatinine, Ser 1.14 (*) 0.50 - 1.10 mg/dL   Calcium 9.9  8.4 - 10.5 mg/dL   GFR calc non Af Amer 43 (*) >90 mL/min   GFR calc Af Amer 50 (*) >90 mL/min  TROPONIN I     Status: None   Collection Time    07/26/13  3:44 PM      Result Value Ref Range   Troponin I <0.30  <0.30 ng/mL  URINALYSIS, ROUTINE W REFLEX MICROSCOPIC     Status: Abnormal   Collection Time    07/26/13  5:00 PM      Result Value Ref Range   Color, Urine YELLOW  YELLOW   APPearance CLEAR  CLEAR   Specific Gravity, Urine >1.030 (*) 1.005 - 1.030   pH 5.5  5.0 - 8.0   Glucose, UA NEGATIVE  NEGATIVE mg/dL   Hgb urine dipstick NEGATIVE  NEGATIVE   Bilirubin Urine NEGATIVE  NEGATIVE   Ketones, ur NEGATIVE  NEGATIVE mg/dL   Protein, ur NEGATIVE  NEGATIVE mg/dL   Urobilinogen, UA 0.2  0.0 - 1.0 mg/dL   Nitrite NEGATIVE  NEGATIVE   Leukocytes, UA NEGATIVE  NEGATIVE  PROTIME-INR     Status: None   Collection Time    07/26/13  9:00 PM      Result Value Ref Range   Prothrombin Time 13.2  11.6 - 15.2 seconds   INR 1.02  0.00 - 1.49  TROPONIN I     Status: None   Collection Time    07/26/13  9:09 PM      Result Value Ref Range   Troponin I  <0.30  <0.30 ng/mL  CBG MONITORING, ED     Status: Abnormal   Collection Time    07/26/13  9:17 PM      Result Value Ref Range   Glucose-Capillary 236 (*) 70 - 99 mg/dL  MRSA PCR SCREENING     Status: None   Collection Time    07/27/13 12:54 AM      Result Value Ref Range   MRSA by PCR NEGATIVE  NEGATIVE  TROPONIN I     Status: None   Collection Time    07/27/13  1:19 AM      Result Value Ref Range   Troponin I <0.30  <0.30 ng/mL  BASIC METABOLIC PANEL     Status: Abnormal   Collection Time    07/27/13  1:20 AM      Result Value Ref Range   Sodium 137  137 - 147 mEq/L   Potassium 4.5  3.7 - 5.3 mEq/L   Chloride 99  96 - 112 mEq/L   CO2 24  19 - 32 mEq/L   Glucose, Bld 204 (*) 70 - 99 mg/dL   BUN 31 (*) 6 - 23 mg/dL   Creatinine, Ser 1.95 (*) 0.50 - 1.10 mg/dL   Calcium 9.9  8.4 - 10.5 mg/dL   GFR calc non Af Amer 23 (*) >90 mL/min   GFR calc Af Amer 26 (*) >90 mL/min  CBC     Status: Abnormal   Collection Time    07/27/13  1:20 AM      Result Value Ref Range   WBC 17.2 (*) 4.0 - 10.5 K/uL   RBC 4.44  3.87 - 5.11 MIL/uL   Hemoglobin 13.8  12.0 - 15.0 g/dL   HCT 43.7  36.0 - 46.0 %  MCV 98.4  78.0 - 100.0 fL   MCH 31.1  26.0 - 34.0 pg   MCHC 31.6  30.0 - 36.0 g/dL   RDW 15.1  11.5 - 15.5 %   Platelets 213  150 - 400 K/uL  GLUCOSE, CAPILLARY     Status: Abnormal   Collection Time    07/27/13  7:30 AM      Result Value Ref Range   Glucose-Capillary 172 (*) 70 - 99 mg/dL  TROPONIN I     Status: None   Collection Time    07/27/13  8:36 AM      Result Value Ref Range   Troponin I <0.30  <0.30 ng/mL    Intake/Output Summary (Last 24 hours) at 07/27/13 1104 Last data filed at 07/27/13 0900  Gross per 24 hour  Intake    200 ml  Output    200 ml  Net      0 ml   EKG:  Afib, HR 105, non-specific t wave changes  ASSESSMENT AND PLAN:  Principal Problem:   Atrial fibrillation with rapid ventricular response Active Problems:   Fibromyalgia   COPD (chronic  obstructive pulmonary disease)   GERD (gastroesophageal reflux disease)   Rheumatoid arteritis   Chest pain   Chronic respiratory failure  Ms. Reynold is a 80 y.o. female w/ PMHx significant for HTN, hyperlipidema and recent new diagnosis of afib, complicated by symptomatic RVR with chest pain and recent hospital admissions who was [facility] on 07/27/2013 for recurrent chest pain and afib rvr.   Afib with RVR associated with CP--HR improved today <110, however noted to have symptoms and increase in HR with ambulation. On admission, noted to have severe left sided chest pain with HR 130-140's which improved after IV Lopressor x1.  On ASA. Cardiac enzymes x5 have been negative. Prior echo with EF 60-65%, mild LVH. CHADSVASc 4, 4%/year adjusted stroke rate with HAS-BLED score of 3 with estimated 34.74% bleeding rate/year.   -continue ASA for now (on 325mg  qd), concern of falls per family.  -cardizem 300mg  qd (increased on admission from 240mg ) -evaluate HR with ambulation   Acute on chronic renal failure--Cr up to 1.95 today. Baseline ~1-1.2. Unclear etiology.  Continue to monitor.   Leukocytosis in setting of chronic steroid use and recent cough: uptrending but on chronic steroids. not localizing. On chronic steroids. Afebrile with no edema on consolidation seen on xray.  -will schedule robitussin dm (did not receive any prn's)  Deconditioned: refuses SNF or living with family. Lives alone with dog, does not have room at home for family members to stay. Family concerned with living situation, have arranged for aide that can try to be full time coverage along with family support.  -PT/OT  Hyperlipidemia -continue Lipitor 40mg   COPD--former smoker but quit in 1980's. On home oxygen, mainly at night and with ambulation. Home medications include albuterol prn, advair, prednisone 5mg . -on dulera -albuterol prn -will schedule robitussin dm to help with cough -continue supplemental  oxygen support as needed  RA and fibromyalgia--on chronic prednisone, lyrica, cymbalta, and methotrexate -continued on home meds  Case discussed and patient seen with Dr. Percival Spanish  Signed: Jerene Pitch, MD PGY-2, Internal Medicine Resident Pager: [phone]  07/27/2013,11:17 AM  History and all data above reviewed.  Patient examined. Seen in consultation early AM.    I agree with the findings as above.  No chest pain at present.  She has a headache.  The patient exam reveals  [id number]  ,  Lungs: Decreased breath sounds  ,  Abd: Positive bowel sounds, no rebound no guarding, Ext No edeam   .  All available labs, radiology testing, previous records reviewed. Agree with documented assessment and plan. Plan as outlined in consult note this AM.   [name]  12:05 PM  07/27/2013

## 2013-07-27 NOTE — Progress Notes (Signed)
PT Cancellation Note  Patient Details Name: [name] MRN: [id number] DOB: 1928/07/10   Cancelled Treatment:    Reason Eval/Treat Not Completed: Patient not medically ready.  Patient with current bedrest orders.  MD* Please write activity orders when appropriate for patient.  Will initiate PT evaluation at that time. Thank you.   Despina Pole 07/27/2013, 7:52 AM Carita Pian. Sanjuana Kava, [place] Pager [phone]

## 2013-07-28 LAB — CBC
HCT: 37 % (ref 36.0–46.0)
Hemoglobin: 12 g/dL (ref 12.0–15.0)
MCH: 30.9 pg (ref 26.0–34.0)
MCHC: 32.4 g/dL (ref 30.0–36.0)
MCV: 95.4 fL (ref 78.0–100.0)
Platelets: 182 10*3/uL (ref 150–400)
RBC: 3.88 MIL/uL (ref 3.87–5.11)
RDW: 15 % (ref 11.5–15.5)
WBC: 11.1 10*3/uL — ABNORMAL HIGH (ref 4.0–10.5)

## 2013-07-28 LAB — URINE CULTURE
Colony Count: NO GROWTH
Culture: NO GROWTH

## 2013-07-28 LAB — BASIC METABOLIC PANEL
BUN: 30 mg/dL — ABNORMAL HIGH (ref 6–23)
CALCIUM: 9.5 mg/dL (ref 8.4–10.5)
CO2: 23 mEq/L (ref 19–32)
Chloride: 98 mEq/L (ref 96–112)
Creatinine, Ser: 1.57 mg/dL — ABNORMAL HIGH (ref 0.50–1.10)
GFR, EST AFRICAN AMERICAN: 34 mL/min — AB (ref >90)
GFR, EST NON AFRICAN AMERICAN: 29 mL/min — AB (ref >90)
Glucose, Bld: 187 mg/dL — ABNORMAL HIGH (ref 70–99)
POTASSIUM: 3.9 meq/L (ref 3.7–5.3)
Sodium: 134 mEq/L — ABNORMAL LOW (ref 137–147)

## 2013-07-28 NOTE — Progress Notes (Signed)
[facility] TEAM 1 - Stepdown/ICU TEAM Progress Note  [name] [id number] DOB: 10-29-1928 DOA: 07/26/2013 PCP: Delphina Cahill, MD  Admit HPI / Brief Narrative: 80 yo F with a hx of COPD on nocturnal oxygen, hypertension, rheumatoid arthritis on chronic prednisone/methotrexate therapy, chronic atrial fibrillation who was discharged from [facility] a few days prior to this readmit who presented with a c/o cp. Reportedly she was in her usual state of health until early the afternoon of her admit when she started having left-sided chest pain. Patient described the pain as "an elephant sitting on my chest", around a 9/10 at its worst, without any radiation, no associated nausea, vomiting or shortness of breath. She was brought to the hospital and found to be in atrial fibrillation with rapid ventricular response.   HPI/Subjective: Pt has numerous varied complaints.  States her R ear hurts, her jaw hurts at times when she chews, she was "pushed too hard" by therapy, she is nauseated, has occasional HA, is "not getting any better," doesn't want to sit up in chair but also c/o "getting weaker and weaker."  Denis cp or sob at present.    Assessment/Plan:  Recently diagnosed Chronic atrial fibrillation with acute rapid ventricular response patient has been deemed not to be anticoagulation candidate on recent cardiology evaluation - prior workup included negative troponins, normal TSH, negative PE CT and no sig valvular dz, nl EF on TTE ([facility] 5/10-13) - severely dilated LA noted on recent TTE - Cards following - rate currently well controlled   Chest pain On both this and prior admit pain has coincided w/ episodes of RVR - Cards does not feel further investigation currently warranted   Acute renal failure Cr peaked at 1.9 - renal insuff was noted during prior admit, leading to stopping of ACE - crt previously peaked at 1.3 - hydrate - avoid nephrotoxins - slowly improving   HTN BP currently well  controlled  Chronic hypoxic resp failure - COPD Requires home oxygen each night, and with exertion during day - some productive cough at present - no signif wheeze   Fibromyalgia  GERD  Rheumatoid arteritis on chronic steroid tx No need for stress dose steroids at present   Hyperglycemia A1c noted to be 6.8 07/21/13 - no prior hx of DM - discussed new diagnosis w/ pt and daughter at length 5/16 - begin education - restrict diet - follow CBG without meds at this time - may require initiation of oral meds at minimum  Obesity - Body mass index is 39.75 kg/(m^2).  Code Status: NO CODE Family Communication: no family present at time of exam today  Disposition Plan: stable for transfer to tele bed today - cont PT/OT - pursue SNF if pt agrees   Consultants: Cardiology  Procedures: none  Antibiotics: none  DVT prophylaxis: SQ heparin   Objective: Blood pressure 126/62, pulse 80, temperature 98.4 F (36.9 C), temperature source Oral, resp. rate 12, height 5\' 2"  (1.575 m), weight 98.6 kg (217 lb 6 oz), SpO2 96.00%.  Intake/Output Summary (Last 24 hours) at 07/28/13 1146 Last data filed at 07/28/13 0800  Gross per 24 hour  Intake   2110 ml  Output    885 ml  Net   1225 ml   Exam: General: No acute respiratory distress - appears stable despite numerous complaints  Lungs: distant bs th/o but clear to auscultation bilaterally without wheezes or crackles Cardiovascular: Regular rate without murmur gallop or rub - distant HS  Abdomen: obese,  nontender, soft, bowel sounds positive, no rebound, no ascites, no appreciable mass Extremities: No significant cyanosis, or clubbing; trace edema bilateral lower extremities  Data Reviewed: Basic Metabolic Panel:  Recent Labs Lab 07/23/13 0548 07/24/13 0616 07/26/13 1423 07/27/13 0120 07/28/13 0300  NA 142 143 138 137 134*  K 3.9 4.1 3.9 4.5 3.9  CL 104 104 99 99 98  CO2 29 29 27 24 23   GLUCOSE 179* 146* 148* 204* 187*  BUN 15 23  24* 31* 30*  CREATININE 1.04 1.27* 1.14* 1.95* 1.57*  CALCIUM 10.1 10.6* 9.9 9.9 9.5   Liver Function Tests:  Recent Labs Lab 07/22/13 0512  AST 27  ALT 26  ALKPHOS 59  BILITOT 1.1  PROT 6.4  ALBUMIN 3.2*   CBC:  Recent Labs Lab 07/22/13 0512 07/23/13 0548 07/26/13 1423 07/27/13 0120 07/28/13 0300  WBC 11.2* 8.2 13.9* 17.2* 11.1*  HGB 14.5 13.2 14.6 13.8 12.0  HCT 44.7 40.9 45.3 43.7 37.0  MCV 96.5 96.5 96.8 98.4 95.4  PLT 148* 172 204 213 182   Cardiac Enzymes:  Recent Labs Lab 07/26/13 1544 07/26/13 2109 07/27/13 0119 07/27/13 0836 07/27/13 1316  TROPONINI <0.30 <0.30 <0.30 <0.30 <0.30   BNP (last 3 results)  Recent Labs  07/21/13 0805 07/26/13 1400  PROBNP 458.1* 425.0   CBG:  Recent Labs Lab 07/26/13 2117 07/27/13 0730  GLUCAP 236* 172*    Recent Results (from the past 240 hour(s))  MRSA PCR SCREENING     Status: None   Collection Time    07/21/13 12:50 PM      Result Value Ref Range Status   MRSA by PCR NEGATIVE  NEGATIVE Final   Comment:            The GeneXpert MRSA Assay (FDA     approved for NASAL specimens     only), is one component of a     comprehensive MRSA colonization     surveillance program. It is not     intended to diagnose MRSA     infection nor to guide or     monitor treatment for     MRSA infections.  URINE CULTURE     Status: None   Collection Time    07/26/13  5:00 PM      Result Value Ref Range Status   Specimen Description URINE, CATHETERIZED   Final   Special Requests NONE   Final   Culture  Setup Time     Final   Value: 07/27/2013 00:38     Performed at [facility]     Final   Value: NO GROWTH     Performed at [facility]   Culture     Final   Value: NO GROWTH     Performed at [facility]   Report Status 07/28/2013 FINAL   Final  MRSA PCR SCREENING     Status: None   Collection Time    07/27/13 12:54 AM      Result Value Ref Range Status   MRSA by PCR  NEGATIVE  NEGATIVE Final   Comment:            The GeneXpert MRSA Assay (FDA     approved for NASAL specimens     only), is one component of a     comprehensive MRSA colonization     surveillance program. It is not     intended to diagnose MRSA  infection nor to guide or     monitor treatment for     MRSA infections.     Studies:  Recent x-ray studies have been reviewed in detail by the Attending Physician  Scheduled Meds:  Scheduled Meds: . aspirin EC  325 mg Oral Daily  . atorvastatin  40 mg Oral q1800  . diltiazem  300 mg Oral Daily  . DULoxetine  60 mg Oral Daily  . ferrous gluconate  325 mg Oral Q breakfast  . folic acid  1 mg Oral Daily  . guaiFENesin-dextromethorphan  5 mL Oral Q6H  . heparin subcutaneous  5,000 Units Subcutaneous 3 times per day  . [START ON 08/01/2013] methotrexate  12.5 mg Oral Q Thu  . mometasone-formoterol  2 puff Inhalation BID  . pantoprazole  40 mg Oral BID  . predniSONE  5 mg Oral Q breakfast  . pregabalin  50 mg Oral BID  . raloxifene  60 mg Oral Daily  . sodium chloride  3 mL Intravenous Q12H  . cyanocobalamin  100 mcg Oral Daily  . vitamin C  500 mg Oral Daily    Time spent on care of this patient: 35 mins   Cherene Altes , MD   [facility] Office  [phone] Pager - Text Page per Amion as per below:  On-Call/Text Page:      [email or web address]      password [email or web address]  If 7PM-7AM, please contact night-coverage [email or web address] Password [email or web address] 07/28/2013, 11:46 AM   LOS: 2 days

## 2013-07-28 NOTE — Progress Notes (Signed)
Patient transferred to 2W at 1500pm, Tele placed on patient and patient [name] is HOH. Patient comfortable, c/o sore throat and right ear pain, tylenol given by previous RN. Patient and family oriented to unit and call light, advised to call for bathroom. Patient verbalized understanding. Will continue to monitor.  Clovis Riley, RN

## 2013-07-28 NOTE — Progress Notes (Signed)
Physical Therapy Note  SATURATION QUALIFICATIONS: (This note is used to comply with regulatory documentation for home oxygen)  Patient Saturations on Room Air at Rest = 93%  Patient Saturations on Room Air while Ambulating = 85%  Patient Saturations on 2 Liters of oxygen while Ambulating = 94%  Please briefly explain why patient needs home oxygen: Patient requires supplemental oxygen to maintain oxygen saturations at acceptable, safe levels.  Roney Marion, [place]  Acute Rehabilitation Services Pager [phone] Office [phone]

## 2013-07-28 NOTE — Progress Notes (Signed)
SUBJECTIVE:  No chest.  No SOB.   However, she has multiple complaints.  Her jaw hurts when she opens it.  She feels sweaty and has a headache.    PHYSICAL EXAM Filed Vitals:   07/27/13 2102 07/27/13 2315 07/28/13 0351 07/28/13 0735  BP:  123/51 132/65 126/62  Pulse:  83 92   Temp:  98.7 F (37.1 C) 98.9 F (37.2 C) 98.4 F (36.9 C)  TempSrc:  Oral Oral Oral  Resp:  19 18 19   Height:      Weight:   217 lb 6 oz (98.6 kg)   SpO2: 98% 93% 92% 95%   General:  No acute distress Lungs:  Clear Heart:  Irregular Abdomen:  Positive bowel sounds, no rebound no guarding Extremities:  No edema  LABS: Lab Results  Component Value Date   TROPONINI <0.30 07/27/2013   Results for orders placed during the hospital encounter of 07/26/13 (from the past 24 hour(s))  TROPONIN I     Status: None   Collection Time    07/27/13  8:36 AM      Result Value Ref Range   Troponin I <0.30  <0.30 ng/mL  TROPONIN I     Status: None   Collection Time    07/27/13  1:16 PM      Result Value Ref Range   Troponin I <0.30  <0.30 ng/mL  BASIC METABOLIC PANEL     Status: Abnormal   Collection Time    07/28/13  3:00 AM      Result Value Ref Range   Sodium 134 (*) 137 - 147 mEq/L   Potassium 3.9  3.7 - 5.3 mEq/L   Chloride 98  96 - 112 mEq/L   CO2 23  19 - 32 mEq/L   Glucose, Bld 187 (*) 70 - 99 mg/dL   BUN 30 (*) 6 - 23 mg/dL   Creatinine, Ser 1.57 (*) 0.50 - 1.10 mg/dL   Calcium 9.5  8.4 - 10.5 mg/dL   GFR calc non Af Amer 29 (*) >90 mL/min   GFR calc Af Amer 34 (*) >90 mL/min  CBC     Status: Abnormal   Collection Time    07/28/13  3:00 AM      Result Value Ref Range   WBC 11.1 (*) 4.0 - 10.5 K/uL   RBC 3.88  3.87 - 5.11 MIL/uL   Hemoglobin 12.0  12.0 - 15.0 g/dL   HCT 37.0  36.0 - 46.0 %   MCV 95.4  78.0 - 100.0 fL   MCH 30.9  26.0 - 34.0 pg   MCHC 32.4  30.0 - 36.0 g/dL   RDW 15.0  11.5 - 15.5 %   Platelets 182  150 - 400 K/uL    Intake/Output Summary (Last 24 hours) at 07/28/13  0831 Last data filed at 07/28/13 0800  Gross per 24 hour  Intake   2310 ml  Output   1085 ml  Net   1225 ml     ASSESSMENT AND PLAN:  CHEST PAIN:  No objective evidence of ischemia.  Enzymes negative.  No further pain. This pain was very atypical.  No further cardiac workup is planned  ATRIAL FIB:  The decision was made by Dr. Harl Bowie at the last hospitalization not to use ASA because of risk of falls.  He had this discussion with the patient and the family.  He will review this with them again at follow up appts.  Telemetry  reviewed and the rate is controlled. Continue current meds.   CKD:  Creat is slightly improved.  Continue with current meds.    [name] 07/28/2013 8:31 AM

## 2013-07-28 NOTE — Evaluation (Addendum)
Physical Therapy Evaluation Patient Details Name: [name] MRN: [id number] DOB: 1928-05-24 Today's Date: 07/28/2013   History of Present Illness  Admitted with chest pain; Found to be in a-fib with RVR; Cardiac workup revealed no evidence of ischemia -- atypical chest pain  Past Medical History  Diagnosis Date  . Anemia   . Back pain   . COPD (chronic obstructive pulmonary disease)   . Depression   . Fibromyalgia   . Hypercholesteremia   . Hypertension   . Insomnia   . Rheumatoid arteritis   . Cancer     Skin   . Vitamin D deficiency   . 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 Generalized anxiety disorder   . GERD (gastroesophageal reflux disease)   . Bacterial pneumonia   . Fatty liver   . Internal hemorrhoids   . Diverticulosis   . Ulcer     stomach  . Gastric polyp 09/08/11    at the anastomosis-inflammatory  . Atrial fibrillation   . Chronic respiratory failure   . On home O2     qhs     Clinical Impression  Pt admitted with above, and presenting to PT with nausea, weakness limiting amb. Pt currently with functional limitations due to the deficits listed below (see PT Problem List).  Pt will benefit from skilled PT to increase their independence and safety with mobility to allow discharge to the venue listed below.   Would hope that with resolution of medical issues, pt will show good progress, and be able to dc home modified independently; Still, at current functional level, recommend postacute rehab to maximize independence and safety with mobility prior to dc home; Will follow and monitor for progress      Follow Up Recommendations SNF    Equipment Recommendations  3in1 (PT)    Recommendations for Other Services OT consult     Precautions / Restrictions Precautions Precautions: Fall Restrictions Weight Bearing Restrictions: No      Mobility  Bed Mobility               General bed mobility comments: in recliner upon  arrival  Transfers Overall transfer level: Needs assistance Equipment used: Rolling walker (2 wheeled) Transfers: Sit to/from Stand Sit to Stand: Mod assist         General transfer comment: Cues for safety , hand placement, and mod physical assist to power-up  Ambulation/Gait Ambulation/Gait assistance: Min assist Ambulation Distance (Feet): 6 Feet Assistive device: Rolling walker (2 wheeled) Gait Pattern/deviations: Decreased stride length;Trunk flexed Gait velocity: quite slow   General Gait Details: short steps, trunk flexed; cues for posture and to self-monitor for activity tolerance; Ultimately amb limited by nausea; At one point, pt stated she felt like she was going to fall  [employer] (Stroke Patients Only)       Balance Overall balance assessment: Needs assistance         Standing balance support: Bilateral upper extremity supported Standing balance-Leahy Scale: Poor                               Pertinent Vitals/Pain No specific reports of pain, but nausea limiting amb    Home Living Family/patient expects to be discharged to:: Unsure Living Arrangements: Alone  Prior Function Level of Independence: Independent with assistive device(s);Needs assistance   Gait / Transfers Assistance Needed: ambulates with rollalator     Comments: has an aide assist her M-F for a large part of the day; adult children help her on teh weekend; Pt reports they are looking into getting more help     Hand Dominance        Extremity/Trunk Assessment   Upper Extremity Assessment: Generalized weakness           Lower Extremity Assessment: Generalized weakness         Communication   Communication: No difficulties  Cognition Arousal/Alertness: Awake/alert Behavior During Therapy: WFL for tasks assessed/performed Overall Cognitive Status: Within Functional Limits for  tasks assessed                      General Comments      Exercises        Assessment/Plan    PT Assessment Patient needs continued PT services  PT Diagnosis Generalized weakness;Difficulty walking   PT Problem List Decreased strength;Decreased activity tolerance;Decreased balance;Decreased mobility;Decreased knowledge of use of DME;Pain  PT Treatment Interventions DME instruction;Gait training;Functional mobility training;Therapeutic activities;Therapeutic exercise;Patient/family education   PT Goals (Current goals can be found in the Care Plan section) Acute Rehab PT Goals Patient Stated Goal: Wants to feel better PT Goal Formulation: With patient Time For Goal Achievement: 08/04/13 Potential to Achieve Goals: Fair    Frequency Min 3X/week   Barriers to discharge Decreased caregiver support Does not have 24 hour assist, and at current functional level, will need it     Co-evaluation               End of Session Equipment Utilized During Treatment: Gait belt;Oxygen Activity Tolerance: Other (comment) (limited by nausea) Patient left: in chair;with call bell/phone within reach Nurse Communication: Mobility status         Time: [id number] PT Time Calculation (min): 22 min   Charges:   PT Evaluation $Initial PT Evaluation Tier I: 1 Procedure PT Treatments $Gait Training: 8-22 mins   PT G Codes:          [facility] [place] 07/28/2013, 9:46 AM Roney Marion, [place] Pager [phone] Office [phone]

## 2013-07-29 DIAGNOSIS — IMO0001 Reserved for inherently not codable concepts without codable children: Secondary | ICD-10-CM

## 2013-07-29 LAB — BASIC METABOLIC PANEL
BUN: 22 mg/dL (ref 6–23)
CALCIUM: 9.9 mg/dL (ref 8.4–10.5)
CHLORIDE: 99 meq/L (ref 96–112)
CO2: 25 mEq/L (ref 19–32)
CREATININE: 1.23 mg/dL — AB (ref 0.50–1.10)
GFR calc Af Amer: 46 mL/min — ABNORMAL LOW (ref >90)
GFR calc non Af Amer: 39 mL/min — ABNORMAL LOW (ref >90)
GLUCOSE: 154 mg/dL — AB (ref 70–99)
Potassium: 4.2 mEq/L (ref 3.7–5.3)
Sodium: 134 mEq/L — ABNORMAL LOW (ref 137–147)

## 2013-07-29 LAB — CBC
HEMATOCRIT: 33.1 % — AB (ref 36.0–46.0)
HEMOGLOBIN: 10.6 g/dL — AB (ref 12.0–15.0)
MCH: 30.7 pg (ref 26.0–34.0)
MCHC: 32 g/dL (ref 30.0–36.0)
MCV: 95.9 fL (ref 78.0–100.0)
Platelets: 188 10*3/uL (ref 150–400)
RBC: 3.45 MIL/uL — AB (ref 3.87–5.11)
RDW: 14.9 % (ref 11.5–15.5)
WBC: 8.4 10*3/uL (ref 4.0–10.5)

## 2013-07-29 LAB — GLUCOSE, CAPILLARY: GLUCOSE-CAPILLARY: 207 mg/dL — AB (ref 70–99)

## 2013-07-29 MED ORDER — OXYCODONE-ACETAMINOPHEN 7.5-325 MG PO TABS: 1.0000 | ORAL_TABLET | Freq: Four times a day (QID) | ORAL | Status: DC | PRN

## 2013-07-29 MED ORDER — GUAIFENESIN-DM 100-10 MG/5ML PO SYRP: 5.0000 mL | ORAL_SOLUTION | Freq: Four times a day (QID) | ORAL | Status: DC

## 2013-07-29 MED ORDER — LORAZEPAM 0.5 MG PO TABS: 0.5000 mg | ORAL_TABLET | Freq: Four times a day (QID) | ORAL | Status: DC | PRN

## 2013-07-29 MED ORDER — DILTIAZEM HCL ER COATED BEADS 300 MG PO CP24: 300.0000 mg | ORAL_CAPSULE | Freq: Every day | ORAL | Status: DC

## 2013-07-29 NOTE — Discharge Summary (Addendum)
Physician Discharge Summary  KALESHIA MUELLER [id number] DOB: February 04, 1929 DOA: 07/26/2013  PCP: Delphina Cahill, MD  Admit date: 07/26/2013 Discharge date: 07/29/2013  Time spent: 35 minutes  Recommendations for Outpatient Follow-up:  1. SNF 2. O2 as needed 3. Periodic check of blood sugar- may need addition of meds 4. Cbc, bmp 1 week    Discharge Diagnoses:  Principal Problem:   Atrial fibrillation with rapid ventricular response Active Problems:   Fibromyalgia   COPD (chronic obstructive pulmonary disease)   GERD (gastroesophageal reflux disease)   Rheumatoid arteritis   Chest pain   Chronic respiratory failure   Discharge Condition: improved  Diet recommendation: cardiac/diabetic  Filed Weights   07/27/13 0055 07/28/13 0351 07/29/13 0430  Weight: 97.2 kg (214 lb 4.6 oz) 98.6 kg (217 lb 6 oz) 98.521 kg (217 lb 3.2 oz)    History of present illness:  [name] is a 80 y.o. female with a Past Medical History of COPD on nocturnal oxygen, hypertension, rheumatoid arthritis on chronic prednisone and methotrexate therapy, History of atrial fibrillation who is his discharge from this facility a few days back who presents today with the above noted complaint. His note, patient is sleepy and is not interested 7 The patient, she was in her usual state of health until early this afternoon when she started having left-sided chest pain. Patient describes the pain as "an elephant sitting on my chest", around a 9/10 at its worst, without any radiation, no associated nausea, vomiting or shortness of breath. She was brought to the hospital and found to be in atrial fibrillation with rapid ventricular response. I was subsequently asked to admit this patient for further evaluation and treatment  During my evaluation, patient's heart rate was persistently in the 130s-140s range, rhythm was atrial fibrillation. She was complaining of left-sided chest pain that was persistent a month she was  given 5 mg of IV Lopressor, heart rate came down to the low 100s range, chest pain significantly improved following that.  Patient denies any headache, fever, cough. She denies any nausea or vomiting. She denies any abdominal pain. She claims that she is significantly weak, and is currently unable to ambulate.   Hospital Course:  Recently diagnosed Chronic atrial fibrillation with acute rapid ventricular response  patient has been deemed not to be anticoagulation candidate on recent cardiology evaluation - prior workup included negative troponins, normal TSH, negative PE CT and no sig valvular dz, nl EF on TTE (AP 5/10-13) - severely dilated LA noted on recent TTE - Cards following - rate currently well controlled   Chest pain  On both this and prior admit pain has coincided w/ episodes of RVR - Cards does not feel further investigation currently warranted   Acute renal failure  Cr peaked at 1.9 - renal insuff was noted during prior admit, leading to stopping of ACE - crt previously peaked at 1.3 - hydrate - avoid nephrotoxins - slowly improving   HTN  BP currently well controlled   Chronic hypoxic resp failure - COPD  Requires home oxygen each night, and with exertion during day - some productive cough at present - no signif wheeze   Fibromyalgia   GERD   Rheumatoid arteritis on chronic steroid tx  No need for stress dose steroids at present   Hyperglycemia  A1c noted to be 6.8 07/21/13 - no prior hx of DM - discussed new diagnosis w/ pt and daughter at length 5/16 - begin education - restrict diet -  follow CBG without meds at this time - may require initiation of oral meds at minimum   Obesity - Body mass index is 39.75 kg/(m^2).   Procedures:  none  Consultations:  cardiology  Discharge Exam: Filed Vitals:   07/29/13 0430  BP: 139/59  Pulse: 85  Temp: 97.4 F (36.3 C)  Resp: 19    General: pleasant/cooperative Cardiovascular: rrr Respiratory: clear but  decreased  Discharge Instructions You were cared for by a hospitalist during your hospital stay. If you have any questions about your discharge medications or the care you received while you were in the hospital after you are discharged, you can call the unit and asked to speak with the hospitalist on call if the hospitalist that took care of you is not available. Once you are discharged, your primary care physician will handle any further medical issues. Please note that NO REFILLS for any discharge medications will be authorized once you are discharged, as it is imperative that you return to your primary care physician (or establish a relationship with a primary care physician if you do not have one) for your aftercare needs so that they can reassess your need for medications and monitor your lab values.     Medication List    ASK your doctor about these medications       ADVAIR DISKUS 250-50 MCG/DOSE Aepb  Generic drug:  Fluticasone-Salmeterol  Inhale 1 puff into the lungs every 12 (twelve) hours.     aspirin 325 MG EC tablet  Take 1 tablet (325 mg total) by mouth daily.     cyanocobalamin 100 MCG tablet  Take 100 mcg by mouth daily.     diltiazem 120 MG 24 hr capsule  Commonly known as:  CARDIZEM CD  Take 1 capsule (120 mg total) by mouth 2 (two) times daily.     DULoxetine 60 MG capsule  Commonly known as:  CYMBALTA  Take 60 mg by mouth daily.     ferrous gluconate 325 MG tablet  Commonly known as:  FERGON  Take 325 mg by mouth daily.     folic acid 1 MG tablet  Commonly known as:  FOLVITE  Take 1 mg by mouth daily.     LORazepam 0.5 MG tablet  Commonly known as:  ATIVAN  Take 0.5 mg by mouth every 6 (six) hours as needed for anxiety.     methotrexate 2.5 MG tablet  Commonly known as:  RHEUMATREX  Take 12.5 mg by mouth once a week. *Taken on Thursdays at 1200Caution:Chemotherapy. Protect from light.     oxyCODONE-acetaminophen 7.5-325 MG per tablet  Commonly known  as:  PERCOCET  Take 1 tablet by mouth 4 (four) times daily as needed.     pantoprazole 40 MG tablet  Commonly known as:  PROTONIX  Take 40 mg by mouth 2 (two) times daily.     polyethylene glycol packet  Commonly known as:  MIRALAX / GLYCOLAX  Take 17 g by mouth daily as needed for mild constipation or moderate constipation.     predniSONE 5 MG tablet  Commonly known as:  DELTASONE  Take 5 mg by mouth daily.     pregabalin 50 MG capsule  Commonly known as:  LYRICA  Take 50 mg by mouth 2 (two) times daily.     PROAIR HFA 108 (90 BASE) MCG/ACT inhaler  Generic drug:  albuterol  Inhale 2 puffs into the lungs every 6 (six) hours as needed. For rescue  PROBIOTIC FORMULA PO  Take 1 tablet by mouth daily.     raloxifene 60 MG tablet  Commonly known as:  EVISTA  Take 60 mg by mouth daily.     rosuvastatin 20 MG tablet  Commonly known as:  CRESTOR  Take 20 mg by mouth daily.     vitamin C 500 MG tablet  Commonly known as:  ASCORBIC ACID  Take 500 mg by mouth daily.     Vitamin D3 1000 UNITS Caps  Take 1 capsule by mouth daily.       Allergies  Allergen Reactions  . Other     Band Aids   . Tape   . Lipitor [Atorvastatin Calcium] Rash and Other (See Comments)    Muscle pain  . Niaspan [Niacin Er] Rash and Other (See Comments)    Muscle pain      The results of significant diagnostics from this hospitalization (including imaging, microbiology, ancillary and laboratory) are listed below for reference.    Significant Diagnostic Studies: Ct Angio Chest Pe W/cm &/or Wo Cm  07/22/2013   CLINICAL DATA:  Chest pain, shortness of breath, tachycardia.  EXAM: CT ANGIOGRAPHY CHEST WITH CONTRAST  TECHNIQUE: Multidetector CT imaging of the chest was performed using the standard protocol during bolus administration of intravenous contrast. Multiplanar CT image reconstructions and MIPs were obtained to evaluate the vascular anatomy.  CONTRAST:  144mL OMNIPAQUE IOHEXOL 350 MG/ML  SOLN  COMPARISON:  Chest CT 10/06/2009 and 07/23/2009.  FINDINGS: No filling defects in the pulmonary arteries to suggest pulmonary emboli. Heart is mildly enlarged. Aortic calcifications without aneurysm or dissection. No mediastinal, hilar, or axillary adenopathy. Chest wall soft tissues are unremarkable.  Minimal dependent atelectasis in the lung bases bilaterally. Lungs are otherwise clear. No pleural effusions.  Imaging into the upper abdomen shows no acute findings. Mild degenerative spurring throughout the thoracic spine.  Review of the MIP images confirms the above findings.  IMPRESSION: No evidence of pulmonary embolus.  Mild cardiomegaly.  Dependent bibasilar atelectasis.   Electronically Signed   By: Rolm Baptise M.D.   On: 07/22/2013 10:02   Dg Chest Port 1 View  07/26/2013   CLINICAL DATA:  Chest pain  EXAM: PORTABLE CHEST - 1 VIEW  COMPARISON:  Jul 21, 2013 chest radiograph and chest CT  FINDINGS: There is no edema or consolidation. Heart is enlarged with pulmonary vascularity within normal limits. No adenopathy. No pneumothorax. No bone lesions.  IMPRESSION: Stable cardiac enlargement.  No edema or consolidation.   Electronically Signed   By: Lowella Grip M.D.   On: 07/26/2013 14:27   Dg Chest Port 1 View  07/21/2013   CLINICAL DATA:  Chest pain weakness  EXAM: PORTABLE CHEST - 1 VIEW  COMPARISON:  DG CHEST 2 VIEW dated 10/16/2011; DG CHEST 2 VIEW dated 05/18/2011; CT ABDOMEN W/CM dated 01/07/2011  FINDINGS: Moderate cardiac enlargement. Mild vascular congestion. Mild perihilar peribronchial wall thickening and mild interstitial prominence.  IMPRESSION: Cardiac enlargement and vascular congestion. Suspect mild interstitial pulmonary edema.   Electronically Signed   By: Skipper Cliche M.D.   On: 07/21/2013 08:40    Microbiology: Recent Results (from the past 240 hour(s))  MRSA PCR SCREENING     Status: None   Collection Time    07/21/13 12:50 PM      Result Value Ref Range Status    MRSA by PCR NEGATIVE  NEGATIVE Final   Comment:  The GeneXpert MRSA Assay (FDA     approved for NASAL specimens     only), is one component of a     comprehensive MRSA colonization     surveillance program. It is not     intended to diagnose MRSA     infection nor to guide or     monitor treatment for     MRSA infections.  URINE CULTURE     Status: None   Collection Time    07/26/13  5:00 PM      Result Value Ref Range Status   Specimen Description URINE, CATHETERIZED   Final   Special Requests NONE   Final   Culture  Setup Time     Final   Value: 07/27/2013 00:38     Performed at [place]     Final   Value: NO GROWTH     Performed at [facility]   Culture     Final   Value: NO GROWTH     Performed at [facility]   Report Status 07/28/2013 FINAL   Final  MRSA PCR SCREENING     Status: None   Collection Time    07/27/13 12:54 AM      Result Value Ref Range Status   MRSA by PCR NEGATIVE  NEGATIVE Final   Comment:            The GeneXpert MRSA Assay (FDA     approved for NASAL specimens     only), is one component of a     comprehensive MRSA colonization     surveillance program. It is not     intended to diagnose MRSA     infection nor to guide or     monitor treatment for     MRSA infections.     Labs: Basic Metabolic Panel:  Recent Labs Lab 07/24/13 0616 07/26/13 1423 07/27/13 0120 07/28/13 0300 07/29/13 0407  NA 143 138 137 134* 134*  K 4.1 3.9 4.5 3.9 4.2  CL 104 99 99 98 99  CO2 29 27 24 23 25   GLUCOSE 146* 148* 204* 187* 154*  BUN 23 24* 31* 30* 22  CREATININE 1.27* 1.14* 1.95* 1.57* 1.23*  CALCIUM 10.6* 9.9 9.9 9.5 9.9   Liver Function Tests: No results found for this basename: AST, ALT, ALKPHOS, BILITOT, PROT, ALBUMIN,  in the last 168 hours No results found for this basename: LIPASE, AMYLASE,  in the last 168 hours No results found for this basename: AMMONIA,  in the last 168  hours CBC:  Recent Labs Lab 07/23/13 0548 07/26/13 1423 07/27/13 0120 07/28/13 0300 07/29/13 0407  WBC 8.2 13.9* 17.2* 11.1* 8.4  HGB 13.2 14.6 13.8 12.0 10.6*  HCT 40.9 45.3 43.7 37.0 33.1*  MCV 96.5 96.8 98.4 95.4 95.9  PLT 172 204 213 182 188   Cardiac Enzymes:  Recent Labs Lab 07/26/13 1544 07/26/13 2109 07/27/13 0119 07/27/13 0836 07/27/13 1316  TROPONINI <0.30 <0.30 <0.30 <0.30 <0.30   BNP: BNP (last 3 results)  Recent Labs  07/21/13 0805 07/26/13 1400  PROBNP 458.1* 425.0   CBG:  Recent Labs Lab 07/26/13 2117 07/27/13 0730  GLUCAP 236* 172*       Signed:  Geradine Girt  [facility] 07/29/2013, 10:27 AM

## 2013-07-29 NOTE — Progress Notes (Signed)
Nutrition Consult / Brief Note  RD consulted for nutrition education regarding diabetes.   Lab Results  Component Value Date   HGBA1C 6.8* 07/21/2013    Patient not appropriate for education; not responding appropriately to RD interview.  RD left "Carbohydrate Counting for People with Diabetes" handout from the Academy of Nutrition and Dietetics on patient's vanity area.  Body mass index is 39.72 kg/(m^2). Pt meets criteria for Obesity Class II based on current BMI.  Current diet order is Low Sodium/Heart Heathly, patient is consuming approximately 80% of meals at this time. Labs and medications reviewed. No further nutrition interventions warranted at this time. If additional nutrition issues arise, please re-consult RD.  Arthur Holms, RD, LDN Pager #: [phone] After-Hours Pager #: [phone]

## 2013-07-29 NOTE — Care Management Note (Signed)
    Page 1 of 1   07/30/2013     4:51:02 PM CARE MANAGEMENT NOTE 07/30/2013  Patient:  [name]   Account Number:  [id number]  Date Initiated:  07/29/2013  Documentation initiated by:  Rodriques Badie  Subjective/Objective Assessment:   Pt adm on 5/15 with chest pain.  PTA, pt resides at home with daughter.     Action/Plan:   PT recommending SNF at dc.  CSW consulted to facilitate dc to poss SNF when medically stable for dc.   Anticipated DC Date:  07/30/2013   Anticipated DC Plan:  SKILLED NURSING FACILITY  In-house referral  Clinical Social Worker      DC Planning Services  CM consult  CM consult      Choice offered to / List presented to:             Status of service:  Completed, signed off Medicare Important Message given?   (If response is "NO", the following Medicare IM given date fields will be blank) Date Medicare IM given:   Date Additional Medicare IM given:  07/30/2013  Discharge Disposition:  [facility]  Per UR Regulation:  Reviewed for med. necessity/level of care/duration of stay  If discussed at [place] of Stay Meetings, dates discussed:    Comments:  07/30/13 Ellan Lambert, RN, BSN [phone] Pt discharged to SNF today, per CSW arrangements.

## 2013-07-30 NOTE — Clinical Social Work Placement (Addendum)
    Clinical Social Work Department CLINICAL SOCIAL WORK PLACEMENT NOTE 07/30/2013  Patient:  [name], [name]  Account Number:  [id number] Admit date:  07/26/2013  Clinical Social Worker:  Butch Penny CROWDER, LCSWA  Date/time:  07/29/2013 05:30 PM  Clinical Social Work is seeking post-discharge placement for this patient at the following level of care:   SKILLED NURSING   (*CSW will update this form in Epic as items are completed)   07/29/2013  Patient/family provided with [place] Department of Clinical Social Work's list of facilities offering this level of care within the geographic area requested by the patient (or if unable, by the patient's family).  07/29/2013  Patient/family informed of their freedom to choose among providers that offer the needed level of care, that participate in Medicare, Medicaid or managed care program needed by the patient, have an available bed and are willing to accept the patient.  07/29/2013  Patient/family informed of [facility]' ownership interest in [facility], as well as of the fact that they are under no obligation to receive care at this facility.  PASARR submitted to EDS on  PASARR number received from [place] on   FL2 transmitted to all facilities in geographic area requested by pt/family on  07/30/2013 FL2 transmitted to all facilities within larger geographic area on   Patient informed that his/her managed care company has contracts with or will negotiate with  certain facilities, including the following:   [facility]- Medicare Complete     Patient/family informed of bed offers received:  07/30/2013 Patient chooses bed at [facility] Physician recommends and patient chooses bed at    Patient to be transferred to  on  07/30/2013 Patient to be transferred to facility by Ambulance  [facility])  The following physician request were entered in Epic:   Additional Comments:

## 2013-07-30 NOTE — Progress Notes (Signed)
Report called to [facility] at [facility], RN

## 2013-07-30 NOTE — Clinical Social Work Psychosocial (Signed)
     Clinical Social Work Department BRIEF PSYCHOSOCIAL ASSESSMENT 07/30/2013  Patient:  [name], [name]     Account Number:  [id number]     Admit date:  07/26/2013  Clinical Social Worker:  Iona Coach  Date/Time:  07/29/2013 05:45 PM  Referred by:  Physician  Date Referred:  07/29/2013 Referred for  SNF Placement   Other Referral:   Interview type:  Other - See comment Other interview type:   Pateint and daugther [name]    PSYCHOSOCIAL DATA Living Status:  ALONE Admitted from facility:   Level of care:   Primary support name:  [name]  [phone] Primary support relationship to patient:  CHILD, ADULT Degree of support available:   Strong support    CURRENT CONCERNS Current Concerns  Post-Acute Placement   Other Concerns:   Has  PCS from 8:15am to 2:15 pm M-F    SOCIAL WORK ASSESSMENT / PLAN 80 year old female referred to CSW for SNF short term rehab. Per Dr. Eliseo Squires- patient is medically stable for d/c today.  CSW met with patient and discussed SNF placement procedure. She is willing to agree to go to SNF but requested that her daughter [name] be her contact person as she "gets things mixed up."  Patient currently recieves in home care from 8:15 am - 2:15 pm and her daughter is looking into getting her hours increased. Patient lives alone and daughter is very supportive. Fl2 will be completed for MD's signature.  CSW spoke to daughter [name]. She stated that her mother lives in [place] and prefers placement at the [facility] if possible. Her next choice would be [facility]. Active bed search will be initated.   Assessment/plan status:  Psychosocial Support/Ongoing Assessment of Needs Other assessment/ plan:   Information/referral to community resources:   SNF bed list provided    PATIENTS/FAMILYS RESPONSE TO PLAN OF CARE: Patient is alert and oriented to person and place. She states that her "memory is bad" and that she gets things  confused at times. She is agreeable to SNF placement and hopes to return home soon.  Patient relates that her daughter is her primary support person and that she trusts her to help her to make d/c plans.  Patient was noted to have a very positiv attitude about rehab placement. Her daughter was extremely pleasant on the phone and verbalized her SNF choices and is supportive of her mother. Plan d/c to SNF tomorrow once final plans are made.  Lorie Phenix. Monie Good, [place]

## 2013-07-30 NOTE — Progress Notes (Addendum)
CSW spoke to patient and called patient's daughter over the phone to discuss bed offers. Daughter stated [facility] was fine and they would like to go with that. Patient agreeable. CSW will arrange transportation by EMS for 2pm this afternoon. Daughter agreeable to plan and thanked Education officer, museum for assistance. CSW signing off at this time.   Jeanette Caprice, MSW, [place]

## 2013-07-30 NOTE — Progress Notes (Signed)
Progress Note  [name] [id number] DOB: 05-08-1928 DOA: 07/26/2013 PCP: Delphina Cahill, MD  Admit HPI / Brief Narrative: 80 yo F with a hx of COPD on nocturnal oxygen, hypertension, rheumatoid arthritis on chronic prednisone/methotrexate therapy, chronic atrial fibrillation who was discharged from [facility] a few days prior to this readmit who presented with a c/o cp. Reportedly she was in her usual state of health until early the afternoon of her admit when she started having left-sided chest pain. Patient described the pain as "an elephant sitting on my chest", around a 9/10 at its worst, without any radiation, no associated nausea, vomiting or shortness of breath. She was brought to the hospital and found to be in atrial fibrillation with rapid ventricular response.   HPI/Subjective: Pleasantly confused No overnight events    Assessment/Plan:  Recently diagnosed Chronic atrial fibrillation with acute rapid ventricular response patient has been deemed not to be anticoagulation candidate on recent cardiology evaluation - prior workup included negative troponins, normal TSH, negative PE CT and no sig valvular dz, nl EF on TTE ([facility] 5/10-13) - severely dilated LA noted on recent TTE - Cards following - rate currently well controlled   Chest pain On both this and prior admit pain has coincided w/ episodes of RVR - Cards does not feel further investigation currently warranted   Acute renal failure Cr peaked at 1.9 - renal insuff was noted during prior admit, leading to stopping of ACE - crt previously peaked at 1.3 - hydrate - avoid nephrotoxins - slowly improving   HTN BP currently well controlled  Chronic hypoxic resp failure - COPD Requires home oxygen each night, and with exertion during day - some productive cough at present - no signif wheeze   Fibromyalgia  GERD  Rheumatoid arteritis on chronic steroid tx No need for stress dose steroids at present   Hyperglycemia A1c noted  to be 6.8 07/21/13 - no prior hx of DM - discussed new diagnosis w/ pt and daughter at length 5/16 - begin education - restrict diet - follow CBG without meds at this time - may require initiation of oral meds at minimum  Obesity - Body mass index is 39.26 kg/(m^2).  Code Status: NO CODE Family Communication: no family present at time of exam today  Disposition Plan: await SNF bed, D/c summ done 5/18  Consultants: Cardiology  Procedures: none  Antibiotics: none  DVT prophylaxis: SQ heparin   Objective: Blood pressure 153/51, pulse 71, temperature 97.6 F (36.4 C), temperature source Oral, resp. rate 17, height 5\' 2"  (1.575 m), weight 97.4 kg (214 lb 11.7 oz), SpO2 96.00%.  Intake/Output Summary (Last 24 hours) at 07/30/13 0914 Last data filed at 07/30/13 0906  Gross per 24 hour  Intake    240 ml  Output   1100 ml  Net   -860 ml   Exam: General: pleasant/cooperative Lungs: distant bs th/o but clear to auscultation bilaterally without wheezes or crackles Cardiovascular: Regular rate without murmur gallop or rub - distant HS  Abdomen: obese, nontender, soft, bowel sounds positive, no rebound, no ascites, no appreciable mass Extremities: No significant cyanosis, or clubbing; trace edema bilateral lower extremities  Data Reviewed: Basic Metabolic Panel:  Recent Labs Lab 07/24/13 0616 07/26/13 1423 07/27/13 0120 07/28/13 0300 07/29/13 0407  NA 143 138 137 134* 134*  K 4.1 3.9 4.5 3.9 4.2  CL 104 99 99 98 99  CO2 29 27 24 23 25   GLUCOSE 146* 148* 204* 187* 154*  BUN 23 24* 31* 30* 22  CREATININE 1.27* 1.14* 1.95* 1.57* 1.23*  CALCIUM 10.6* 9.9 9.9 9.5 9.9   Liver Function Tests: No results found for this basename: AST, ALT, ALKPHOS, BILITOT, PROT, ALBUMIN,  in the last 168 hours CBC:  Recent Labs Lab 07/26/13 1423 07/27/13 0120 07/28/13 0300 07/29/13 0407  WBC 13.9* 17.2* 11.1* 8.4  HGB 14.6 13.8 12.0 10.6*  HCT 45.3 43.7 37.0 33.1*  MCV 96.8 98.4 95.4  95.9  PLT 204 213 182 188   Cardiac Enzymes:  Recent Labs Lab 07/26/13 1544 07/26/13 2109 07/27/13 0119 07/27/13 0836 07/27/13 1316  TROPONINI <0.30 <0.30 <0.30 <0.30 <0.30   BNP (last 3 results)  Recent Labs  07/21/13 0805 07/26/13 1400  PROBNP 458.1* 425.0   CBG:  Recent Labs Lab 07/26/13 2117 07/27/13 0021 07/27/13 0730  GLUCAP 236* 207* 172*    Recent Results (from the past 240 hour(s))  MRSA PCR SCREENING     Status: None   Collection Time    07/21/13 12:50 PM      Result Value Ref Range Status   MRSA by PCR NEGATIVE  NEGATIVE Final   Comment:            The GeneXpert MRSA Assay (FDA     approved for NASAL specimens     only), is one component of a     comprehensive MRSA colonization     surveillance program. It is not     intended to diagnose MRSA     infection nor to guide or     monitor treatment for     MRSA infections.  URINE CULTURE     Status: None   Collection Time    07/26/13  5:00 PM      Result Value Ref Range Status   Specimen Description URINE, CATHETERIZED   Final   Special Requests NONE   Final   Culture  Setup Time     Final   Value: 07/27/2013 00:38     Performed at [place] [facility]     Final   Value: NO GROWTH     Performed at [facility]   Culture     Final   Value: NO GROWTH     Performed at [facility]   Report Status 07/28/2013 FINAL   Final  MRSA PCR SCREENING     Status: None   Collection Time    07/27/13 12:54 AM      Result Value Ref Range Status   MRSA by PCR NEGATIVE  NEGATIVE Final   Comment:            The GeneXpert MRSA Assay (FDA     approved for NASAL specimens     only), is one component of a     comprehensive MRSA colonization     surveillance program. It is not     intended to diagnose MRSA     infection nor to guide or     monitor treatment for     MRSA infections.     Studies:  Recent x-ray studies have been reviewed in detail by the Attending  Physician  Scheduled Meds:  Scheduled Meds: . aspirin EC  325 mg Oral Daily  . atorvastatin  40 mg Oral q1800  . diltiazem  300 mg Oral Daily  . DULoxetine  60 mg Oral Daily  . ferrous gluconate  325 mg Oral Q breakfast  . folic acid  1 mg Oral Daily  . guaiFENesin-dextromethorphan  5 mL Oral Q6H  . heparin subcutaneous  5,000 Units Subcutaneous 3 times per day  . [START ON 08/01/2013] methotrexate  12.5 mg Oral Q Thu  . mometasone-formoterol  2 puff Inhalation BID  . pantoprazole  40 mg Oral BID  . predniSONE  5 mg Oral Q breakfast  . pregabalin  50 mg Oral BID  . raloxifene  60 mg Oral Daily  . sodium chloride  3 mL Intravenous Q12H  . cyanocobalamin  100 mcg Oral Daily  . vitamin C  500 mg Oral Daily    Time spent on care of this patient: 35 mins   [facility] [phone]   If 7PM-7AM, please contact night-coverage [email or web address] Password [email or web address] 07/30/2013, 9:14 AM   LOS: 4 days

## 2013-08-07 ENCOUNTER — Encounter: Payer: Self-pay | Admitting: *Deleted

## 2013-08-07 ENCOUNTER — Encounter: Payer: Self-pay | Admitting: Adult Health

## 2013-08-07 ENCOUNTER — Encounter: Payer: Medicare Other | Admitting: Adult Health

## 2013-08-07 VITALS — BP 130/70 | HR 89 | Ht 62.0 in | Wt 211.0 lb

## 2013-08-07 DIAGNOSIS — E876 Hypokalemia: Secondary | ICD-10-CM

## 2013-08-07 DIAGNOSIS — I4891 Unspecified atrial fibrillation: Secondary | ICD-10-CM

## 2013-08-07 DIAGNOSIS — J449 Chronic obstructive pulmonary disease, unspecified: Secondary | ICD-10-CM

## 2013-08-07 NOTE — Assessment & Plan Note (Addendum)
Now rate controlled on diltiazem 300 mg daily. I have had a lengthy discussion with the patient and her daughter concerning risk benefit of beginning anticoagulation with CHADs score of 4.  The daughter thinks that she will do better with Novel agent vs coumadin as the frequent dose adjustments may be too confusing for her. She is willing to try Eliquis.  I have checked recent creatinine on 07/29/2013 1.23.  Will begin 2.5 mg BID. She will have CBC and BMET completed in one week. She will be seen on close follow up in one month. She is due to be at [facility] for another 3 weeks and then return home to assisted living. We will see her the week she returns home.

## 2013-08-07 NOTE — Progress Notes (Signed)
    ERROR-No Show

## 2013-08-07 NOTE — Progress Notes (Signed)
Name: [name]    DOB: [birth year]  Age: 80 y.o.  MR#: [id number]       PCP:  Delphina Cahill, MD      Insurance: Payor: [employer] MEDICARE / Plan: AARP MEDICARE COMPLETE / Product Type: *No Product type* /   CC:    Chief Complaint  Patient presents with  . Atrial Fibrillation    VS Filed Vitals:   08/07/13 1410  BP: 130/70  Pulse: 89  Height: 5\' 2"  (1.575 m)  Weight: 211 lb (95.709 kg)    Weights Current Weight  08/07/13 211 lb (95.709 kg)  07/30/13 214 lb 11.7 oz (97.4 kg)  07/23/13 216 lb 7.9 oz (98.2 kg)    Blood Pressure  BP Readings from Last 3 Encounters:  08/07/13 130/70  07/30/13 153/51  07/24/13 131/61     Admit date:  (Not on file) Last encounter with RMR:  Visit date not found   Allergy Other; Tape; Lipitor; and Niaspan  Current Outpatient Prescriptions  Medication Sig Dispense Refill  . albuterol (PROAIR HFA) 108 (90 BASE) MCG/ACT inhaler Inhale 2 puffs into the lungs every 6 (six) hours as needed. For rescue      . aspirin EC 325 MG EC tablet Take 1 tablet (325 mg total) by mouth daily.  30 tablet  0  . Cholecalciferol (VITAMIN D3) 1000 UNITS CAPS Take 1 capsule by mouth daily.      . cyanocobalamin 100 MCG tablet Take 100 mcg by mouth daily.      Marland Kitchen diltiazem (CARDIZEM CD) 300 MG 24 hr capsule Take 1 capsule (300 mg total) by mouth daily.      . DULoxetine (CYMBALTA) 60 MG capsule Take 60 mg by mouth daily.        . ferrous gluconate (FERGON) 325 MG tablet Take 325 mg by mouth daily.       . Fluticasone-Salmeterol (ADVAIR DISKUS) 250-50 MCG/DOSE AEPB Inhale 1 puff into the lungs every 12 (twelve) hours.      . folic acid (FOLVITE) 1 MG tablet Take 1 mg by mouth daily.      Marland Kitchen guaiFENesin-dextromethorphan (ROBITUSSIN DM) 100-10 MG/5ML syrup Take 5 mLs by mouth every 6 (six) hours.  118 mL  0  . LORazepam (ATIVAN) 0.5 MG tablet Take 1 tablet (0.5 mg total) by mouth every 6 (six) hours as needed for anxiety.  10 tablet  0  . methotrexate  (RHEUMATREX) 2.5 MG tablet Take 12.5 mg by mouth once a week. *Taken on Thursdays at 1200Caution:Chemotherapy. Protect from light.      Marland Kitchen oxyCODONE-acetaminophen (PERCOCET) 7.5-325 MG per tablet Take 1 tablet by mouth 4 (four) times daily as needed.  10 tablet  0  . pantoprazole (PROTONIX) 40 MG tablet Take 40 mg by mouth 2 (two) times daily.       . polyethylene glycol (MIRALAX / GLYCOLAX) packet Take 17 g by mouth daily as needed for mild constipation or moderate constipation.       . predniSONE (DELTASONE) 5 MG tablet Take 5 mg by mouth daily.        . pregabalin (LYRICA) 50 MG capsule Take 50 mg by mouth 2 (two) times daily.       . Probiotic Product (PROBIOTIC FORMULA PO) Take 1 tablet by mouth daily.       . raloxifene (EVISTA) 60 MG tablet Take 60 mg by mouth daily.      . rosuvastatin (CRESTOR) 20 MG tablet Take 20 mg by mouth  daily.       . vitamin C (ASCORBIC ACID) 500 MG tablet Take 500 mg by mouth daily.       No current facility-administered medications for this visit.    Discontinued Meds:   There are no discontinued medications.  Patient Active Problem List   Diagnosis Date Noted  . Atrial fibrillation with rapid ventricular response 07/21/2013  . Chest pain 07/21/2013  . Chronic respiratory failure 07/21/2013  . Hypokalemia 07/21/2013  . Atrial fibrillation with RVR 07/21/2013  . Knee pain 05/21/2013  . Fibromyalgia   . COPD (chronic obstructive pulmonary disease)   . Hereditary peripheral neuropathy   . GERD (gastroesophageal reflux disease)   . Rheumatoid arteritis   . Back pain   . Fracture of distal fibula 09/20/2010    LABS    Component Value Date/Time   NA 134* 07/29/2013 0407   NA 134* 07/28/2013 0300   NA 137 07/27/2013 0120   K 4.2 07/29/2013 0407   K 3.9 07/28/2013 0300   K 4.5 07/27/2013 0120   CL 99 07/29/2013 0407   CL 98 07/28/2013 0300   CL 99 07/27/2013 0120   CO2 25 07/29/2013 0407   CO2 23 07/28/2013 0300   CO2 24 07/27/2013 0120   GLUCOSE 154*  07/29/2013 0407   GLUCOSE 187* 07/28/2013 0300   GLUCOSE 204* 07/27/2013 0120   BUN 22 07/29/2013 0407   BUN 30* 07/28/2013 0300   BUN 31* 07/27/2013 0120   CREATININE 1.23* 07/29/2013 0407   CREATININE 1.57* 07/28/2013 0300   CREATININE 1.95* 07/27/2013 0120   CALCIUM 9.9 07/29/2013 0407   CALCIUM 9.5 07/28/2013 0300   CALCIUM 9.9 07/27/2013 0120   GFRNONAA 39* 07/29/2013 0407   GFRNONAA 29* 07/28/2013 0300   GFRNONAA 23* 07/27/2013 0120   GFRAA 46* 07/29/2013 0407   GFRAA 34* 07/28/2013 0300   GFRAA 26* 07/27/2013 0120   CMP     Component Value Date/Time   NA 134* 07/29/2013 0407   K 4.2 07/29/2013 0407   CL 99 07/29/2013 0407   CO2 25 07/29/2013 0407   GLUCOSE 154* 07/29/2013 0407   BUN 22 07/29/2013 0407   CREATININE 1.23* 07/29/2013 0407   CALCIUM 9.9 07/29/2013 0407   PROT 6.4 07/22/2013 0512   ALBUMIN 3.2* 07/22/2013 0512   AST 27 07/22/2013 0512   ALT 26 07/22/2013 0512   ALKPHOS 59 07/22/2013 0512   BILITOT 1.1 07/22/2013 0512   GFRNONAA 39* 07/29/2013 0407   GFRAA 46* 07/29/2013 0407       Component Value Date/Time   WBC 8.4 07/29/2013 0407   WBC 11.1* 07/28/2013 0300   WBC 17.2* 07/27/2013 0120   HGB 10.6* 07/29/2013 0407   HGB 12.0 07/28/2013 0300   HGB 13.8 07/27/2013 0120   HCT 33.1* 07/29/2013 0407   HCT 37.0 07/28/2013 0300   HCT 43.7 07/27/2013 0120   MCV 95.9 07/29/2013 0407   MCV 95.4 07/28/2013 0300   MCV 98.4 07/27/2013 0120    Lipid Panel  No results found for this basename: chol, trig, hdl, cholhdl, vldl, ldlcalc    ABG    Component Value Date/Time   PHART 7.377 11/16/2008 2228   PCO2ART 43.0 11/16/2008 2228   PO2ART 71.0* 11/16/2008 2228   HCO3 25.3* 11/16/2008 2228   TCO2 27 11/16/2008 2228   O2SAT 94.0 11/16/2008 2228     Lab Results  Component Value Date   TSH 2.010 07/21/2013   BNP (last 3 results)  Recent Labs  07/21/13 0805 07/26/13 1400  PROBNP 458.1* 425.0   Cardiac Panel (last 3 results) No results found for this basename: CKTOTAL, CKMB, TROPONINI, RELINDX,   in the last 72 hours  Iron/TIBC/Ferritin    Component Value Date/Time   IRON 49 04/15/2010 1614   TIBC 230* 04/15/2010 1614   FERRITIN 100 04/15/2010 1614     EKG Orders placed during the hospital encounter of 07/26/13  . EKG 12-LEAD  . EKG 12-LEAD  . EKG 12-LEAD  . EKG 12-LEAD  . EKG 12-LEAD  . EKG 12-LEAD  . EKG 12-LEAD  . EKG 12-LEAD  . EKG 12-LEAD  . EKG 12-LEAD  . EKG     Prior Assessment and Plan Problem List as of 08/07/2013     Cardiovascular and Mediastinum   Rheumatoid arteritis   Atrial fibrillation with rapid ventricular response   Atrial fibrillation with RVR     Respiratory   COPD (chronic obstructive pulmonary disease)   Chronic respiratory failure     Digestive   GERD (gastroesophageal reflux disease)     Nervous and Auditory   Hereditary peripheral neuropathy     Musculoskeletal and Integument   Fracture of distal fibula   Fibromyalgia     Other   Back pain   Knee pain   Chest pain   Hypokalemia       Imaging: Ct Angio Chest Pe W/cm &/or Wo Cm  07/22/2013   CLINICAL DATA:  Chest pain, shortness of breath, tachycardia.  EXAM: CT ANGIOGRAPHY CHEST WITH CONTRAST  TECHNIQUE: Multidetector CT imaging of the chest was performed using the standard protocol during bolus administration of intravenous contrast. Multiplanar CT image reconstructions and MIPs were obtained to evaluate the vascular anatomy.  CONTRAST:  159mL OMNIPAQUE IOHEXOL 350 MG/ML SOLN  COMPARISON:  Chest CT 10/06/2009 and 07/23/2009.  FINDINGS: No filling defects in the pulmonary arteries to suggest pulmonary emboli. Heart is mildly enlarged. Aortic calcifications without aneurysm or dissection. No mediastinal, hilar, or axillary adenopathy. Chest wall soft tissues are unremarkable.  Minimal dependent atelectasis in the lung bases bilaterally. Lungs are otherwise clear. No pleural effusions.  Imaging into the upper abdomen shows no acute findings. Mild degenerative spurring throughout the  thoracic spine.  Review of the MIP images confirms the above findings.  IMPRESSION: No evidence of pulmonary embolus.  Mild cardiomegaly.  Dependent bibasilar atelectasis.   Electronically Signed   By: Rolm Baptise M.D.   On: 07/22/2013 10:02   Dg Chest Port 1 View  07/26/2013   CLINICAL DATA:  Chest pain  EXAM: PORTABLE CHEST - 1 VIEW  COMPARISON:  Jul 21, 2013 chest radiograph and chest CT  FINDINGS: There is no edema or consolidation. Heart is enlarged with pulmonary vascularity within normal limits. No adenopathy. No pneumothorax. No bone lesions.  IMPRESSION: Stable cardiac enlargement.  No edema or consolidation.   Electronically Signed   By: Lowella Grip M.D.   On: 07/26/2013 14:27   Dg Chest Port 1 View  07/21/2013   CLINICAL DATA:  Chest pain weakness  EXAM: PORTABLE CHEST - 1 VIEW  COMPARISON:  DG CHEST 2 VIEW dated 10/16/2011; DG CHEST 2 VIEW dated 05/18/2011; CT ABDOMEN W/CM dated 01/07/2011  FINDINGS: Moderate cardiac enlargement. Mild vascular congestion. Mild perihilar peribronchial wall thickening and mild interstitial prominence.  IMPRESSION: Cardiac enlargement and vascular congestion. Suspect mild interstitial pulmonary edema.   Electronically Signed   By: Skipper Cliche M.D.   On: 07/21/2013 08:40

## 2013-08-07 NOTE — Assessment & Plan Note (Signed)
Most recent lab completed on 5.18/2015 potassium of 4.2.

## 2013-08-07 NOTE — Patient Instructions (Signed)
Your physician recommends that you schedule a follow-up appointment in: 1 month  Your physician has recommended you make the following change in your medication:   Start Eliquis 5 mg twice a day

## 2013-08-07 NOTE — Assessment & Plan Note (Signed)
No short of breath or wheezing. She is still sedentary with maximum assistance.

## 2013-08-08 NOTE — Assessment & Plan Note (Signed)
See discussion with the daughter and the patient concerning risks and benefits of anticoagulation therapy. The daughter prepares her medications when she is at home, but she is currently at [facility] facility. Discussion of a novel anticoagulation agent vs. Coumadin. Patient's CHADS score  is 4.  After lengthy discussion, we will begin the patient on Eliquis 2.5 mg twice a day as she is greater than age 80, heart most recent creatinine was found to be 1.23. Her weight is 211 pounds.. We will recheck a BMET and a CBC. Will have close followup in one month to evaluate her status. She will be at [facility] facility for the next 3 weeks. She had not been started on anticoagulation with a history of falls in the past, but with rehabilitation and skilled nursing at home when she is released from [facility] will increase the likelihood that she will be more safe and have better supervision.

## 2013-08-08 NOTE — Progress Notes (Signed)
HPI: She we are following posthospitalization after admission for atrial fibrillation with RVR. Patient other history includes fibromyalgia COPD GERD rheumatoid arthritis with episode of chest pain during hospitalization.    The patient was found to have atrial fib with RVR, and was given IV Cardizem with transitioned to by mouth Cardizem. The patient had an echocardiogram  Left ventricle: The cavity size was normal. Wall thickness was increased in a pattern of mild LVH. Systolic function was normal. The estimated ejection fraction was in the range of 60% to 65%. The study was not technically sufficient to allow evaluation of LV diastolic dysfunction due to atrial fibrillation. - Aortic valve: Mildly calcified annulus. Mildly thickened leaflets. Valve area: 1.57cm^2 (Vmax). - Mitral valve: Mildly to moderately calcified annulus. Mildly thickened leaflets . - Left atrium: The atrium was severely dilated. - Right ventricle: The cavity size was mildly dilated. - Right atrium: The atrium was moderately dilated.  She comes today with her daughter who is concerned about her current status. She was not started on anticoagulation due to history of falls at home. [facility] and is receiving physical therapy. She is continues to be unsteady on her feet but is using a walker and is progressing slowly. There was discussion during our visit with need to begin anticoagulation therapy. The patient has had no anticoagulation in the past, and would now like to discuss this issue. Her CHADS score  is 4.  She denies recurrent chest pain dizziness or increase work of breathing.  Allergies  Allergen Reactions  . Other     Band Aids   . Tape   . Lipitor [Atorvastatin Calcium] Rash and Other (See Comments)    Muscle pain  . Niaspan [Niacin Er] Rash and Other (See Comments)    Muscle pain    Current Outpatient Prescriptions  Medication Sig Dispense Refill  . albuterol  (PROAIR HFA) 108 (90 BASE) MCG/ACT inhaler Inhale 2 puffs into the lungs every 6 (six) hours as needed. For rescue      . aspirin EC 325 MG EC tablet Take 1 tablet (325 mg total) by mouth daily.  30 tablet  0  . Cholecalciferol (VITAMIN D3) 1000 UNITS CAPS Take 1 capsule by mouth daily.      . cyanocobalamin 100 MCG tablet Take 100 mcg by mouth daily.      Marland Kitchen diltiazem (CARDIZEM CD) 300 MG 24 hr capsule Take 1 capsule (300 mg total) by mouth daily.      . DULoxetine (CYMBALTA) 60 MG capsule Take 60 mg by mouth daily.        . ferrous gluconate (FERGON) 325 MG tablet Take 325 mg by mouth daily.       . Fluticasone-Salmeterol (ADVAIR DISKUS) 250-50 MCG/DOSE AEPB Inhale 1 puff into the lungs every 12 (twelve) hours.      . folic acid (FOLVITE) 1 MG tablet Take 1 mg by mouth daily.      Marland Kitchen guaiFENesin-dextromethorphan (ROBITUSSIN DM) 100-10 MG/5ML syrup Take 5 mLs by mouth every 6 (six) hours.  118 mL  0  . LORazepam (ATIVAN) 0.5 MG tablet Take 1 tablet (0.5 mg total) by mouth every 6 (six) hours as needed for anxiety.  10 tablet  0  . methotrexate (RHEUMATREX) 2.5 MG tablet Take 12.5 mg by mouth once a week. *Taken on Thursdays at 1200Caution:Chemotherapy. Protect from light.      Marland Kitchen oxyCODONE-acetaminophen (PERCOCET) 7.5-325 MG per tablet Take 1  tablet by mouth 4 (four) times daily as needed.  10 tablet  0  . pantoprazole (PROTONIX) 40 MG tablet Take 40 mg by mouth 2 (two) times daily.       . polyethylene glycol (MIRALAX / GLYCOLAX) packet Take 17 g by mouth daily as needed for mild constipation or moderate constipation.       . predniSONE (DELTASONE) 5 MG tablet Take 5 mg by mouth daily.        . pregabalin (LYRICA) 50 MG capsule Take 50 mg by mouth 2 (two) times daily.       . Probiotic Product (PROBIOTIC FORMULA PO) Take 1 tablet by mouth daily.       . raloxifene (EVISTA) 60 MG tablet Take 60 mg by mouth daily.      . rosuvastatin (CRESTOR) 20 MG tablet Take 20 mg by mouth daily.       . vitamin  C (ASCORBIC ACID) 500 MG tablet Take 500 mg by mouth daily.       No current facility-administered medications for this visit.    Past Medical History  Diagnosis Date  . Anemia   . Back pain   . COPD (chronic obstructive pulmonary disease)   . Depression   . Fibromyalgia   . Hypercholesteremia   . Hypertension   . Insomnia   . Rheumatoid arteritis   . Cancer     Skin   . Vitamin D deficiency   . 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 Generalized anxiety disorder   . GERD (gastroesophageal reflux disease)   . Bacterial pneumonia   . Fatty liver   . Internal hemorrhoids   . Diverticulosis   . Ulcer     stomach  . Gastric polyp 09/08/11    at the anastomosis-inflammatory  . Atrial fibrillation   . Chronic respiratory failure   . On home O2     qhs    Past Surgical History  Procedure Laterality Date  . Abdominal surgery      removed patial stomach from ulcer  . Abdominal hysterectomy    . Colonoscopy  04-2001    internal hemorrhoids, panocolonic diverticulosis, and colon polyps   . Upper gastrointestinal endoscopy      ROS:  Review of systems complete and found to be negative unless listed above  PHYSICAL EXAM BP 130/70  Pulse 89  Ht 5\' 2"  (1.575 m)  Wt 211 lb (95.709 kg)  BMI 38.58 kg/m2 \\General : Well developed, well nourished, in no acute distress, obese sitting in a wheelchair. Head: Eyes PERRLA, No xanthomas.   Normal cephalic and atramatic  Lungs: Clear bilaterally to auscultation and percussion. Heart: HIRR S1 S2, without MRG.  Pulses are 2+ & equal.            No carotid bruit. No JVD.  No abdominal bruits. No femoral bruits. Abdomen: Bowel sounds are positive, abdomen soft and non-tender without masses or                  Hernia's noted. Msk:  Back normal, normal gait. Normal strength and tone for age. Extremities: No clubbing, cyanosis or edema.  DP +1 Neuro: Alert and oriented X 3. Psych:  Good affect, responds appropriately   EKG: Atrial  fibrillation rate of 80 beats per minute.  ASSESSMENT AND PLAN

## 2013-08-09 ENCOUNTER — Encounter: Payer: Self-pay | Admitting: Adult Health

## 2013-09-11 ENCOUNTER — Ambulatory Visit (INDEPENDENT_AMBULATORY_CARE_PROVIDER_SITE_OTHER): Payer: Medicare Other | Admitting: Cardiology

## 2013-09-11 ENCOUNTER — Encounter: Payer: Self-pay | Admitting: Cardiology

## 2013-09-11 VITALS — BP 134/80 | HR 90 | Ht 62.0 in | Wt 211.0 lb

## 2013-09-11 DIAGNOSIS — I4891 Unspecified atrial fibrillation: Secondary | ICD-10-CM

## 2013-09-11 LAB — CBC WITH DIFFERENTIAL/PLATELET
BASOS PCT: 0 % (ref 0–1)
Basophils Absolute: 0 10*3/uL (ref 0.0–0.1)
EOS PCT: 1 % (ref 0–5)
Eosinophils Absolute: 0.1 10*3/uL (ref 0.0–0.7)
HEMATOCRIT: 41.6 % (ref 36.0–46.0)
HEMOGLOBIN: 13.6 g/dL (ref 12.0–15.0)
Lymphocytes Relative: 31 % (ref 12–46)
Lymphs Abs: 2.6 10*3/uL (ref 0.7–4.0)
MCH: 29.5 pg (ref 26.0–34.0)
MCHC: 32.7 g/dL (ref 30.0–36.0)
MCV: 90.2 fL (ref 78.0–100.0)
MONO ABS: 0.4 10*3/uL (ref 0.1–1.0)
MONOS PCT: 5 % (ref 3–12)
Neutro Abs: 5.4 10*3/uL (ref 1.7–7.7)
Neutrophils Relative %: 63 % (ref 43–77)
Platelets: 232 10*3/uL (ref 150–400)
RBC: 4.61 MIL/uL (ref 3.87–5.11)
RDW: 14.5 % (ref 11.5–15.5)
WBC: 8.5 10*3/uL (ref 4.0–10.5)

## 2013-09-11 LAB — BASIC METABOLIC PANEL
BUN: 17 mg/dL (ref 6–23)
CHLORIDE: 103 meq/L (ref 96–112)
CO2: 33 mEq/L — ABNORMAL HIGH (ref 19–32)
CREATININE: 1.14 mg/dL — AB (ref 0.50–1.10)
Calcium: 9.8 mg/dL (ref 8.4–10.5)
Glucose, Bld: 137 mg/dL — ABNORMAL HIGH (ref 70–99)
Potassium: 3.9 mEq/L (ref 3.5–5.3)
Sodium: 142 mEq/L (ref 135–145)

## 2013-09-11 NOTE — Progress Notes (Signed)
Clinical Summary [name] is a 80 y.o.female seen today for follow up of the following medical problems. This is a focused visit on her afib.   1. Afib - prior admissions in May 2015 with afib with RVR and  atypical chest pain  - patient denies any recent symptoms of palpitations - started on eliquis last visit, since starting has not noted any significant bleeding.     Past Medical History  Diagnosis Date  . Anemia   . Back pain   . COPD (chronic obstructive pulmonary disease)   . Depression   . Fibromyalgia   . Hypercholesteremia   . Hypertension   . Insomnia   . Rheumatoid arteritis   . Cancer     Skin   . Vitamin D deficiency   . 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 Generalized anxiety disorder   . GERD (gastroesophageal reflux disease)   . Bacterial pneumonia   . Fatty liver   . Internal hemorrhoids   . Diverticulosis   . Ulcer     stomach  . Gastric polyp 09/08/11    at the anastomosis-inflammatory  . Atrial fibrillation   . Chronic respiratory failure   . On home O2     qhs     Allergies  Allergen Reactions  . Other     Band Aids   . Tape   . Lipitor [Atorvastatin Calcium] Rash and Other (See Comments)    Muscle pain  . Niaspan [Niacin Er] Rash and Other (See Comments)    Muscle pain     Current Outpatient Prescriptions  Medication Sig Dispense Refill  . albuterol (PROAIR HFA) 108 (90 BASE) MCG/ACT inhaler Inhale 2 puffs into the lungs every 6 (six) hours as needed. For rescue      . aspirin EC 325 MG EC tablet Take 1 tablet (325 mg total) by mouth daily.  30 tablet  0  . Cholecalciferol (VITAMIN D3) 1000 UNITS CAPS Take 1 capsule by mouth daily.      . cyanocobalamin 100 MCG tablet Take 100 mcg by mouth daily.      Marland Kitchen diltiazem (CARDIZEM CD) 300 MG 24 hr capsule Take 1 capsule (300 mg total) by mouth daily.      . DULoxetine (CYMBALTA) 60 MG capsule Take 60 mg by mouth daily.        . ferrous gluconate (FERGON) 325 MG tablet Take  325 mg by mouth daily.       . Fluticasone-Salmeterol (ADVAIR DISKUS) 250-50 MCG/DOSE AEPB Inhale 1 puff into the lungs every 12 (twelve) hours.      . folic acid (FOLVITE) 1 MG tablet Take 1 mg by mouth daily.      Marland Kitchen guaiFENesin-dextromethorphan (ROBITUSSIN DM) 100-10 MG/5ML syrup Take 5 mLs by mouth every 6 (six) hours.  118 mL  0  . LORazepam (ATIVAN) 0.5 MG tablet Take 1 tablet (0.5 mg total) by mouth every 6 (six) hours as needed for anxiety.  10 tablet  0  . methotrexate (RHEUMATREX) 2.5 MG tablet Take 12.5 mg by mouth once a week. *Taken on Thursdays at 1200Caution:Chemotherapy. Protect from light.      Marland Kitchen oxyCODONE-acetaminophen (PERCOCET) 7.5-325 MG per tablet Take 1 tablet by mouth 4 (four) times daily as needed.  10 tablet  0  . pantoprazole (PROTONIX) 40 MG tablet Take 40 mg by mouth 2 (two) times daily.       . polyethylene glycol (MIRALAX / GLYCOLAX) packet Take 17 g by  mouth daily as needed for mild constipation or moderate constipation.       . predniSONE (DELTASONE) 5 MG tablet Take 5 mg by mouth daily.        . pregabalin (LYRICA) 50 MG capsule Take 50 mg by mouth 2 (two) times daily.       . Probiotic Product (PROBIOTIC FORMULA PO) Take 1 tablet by mouth daily.       . raloxifene (EVISTA) 60 MG tablet Take 60 mg by mouth daily.      . rosuvastatin (CRESTOR) 20 MG tablet Take 20 mg by mouth daily.       . vitamin C (ASCORBIC ACID) 500 MG tablet Take 500 mg by mouth daily.       No current facility-administered medications for this visit.     Past Surgical History  Procedure Laterality Date  . Abdominal surgery      removed patial stomach from ulcer  . Abdominal hysterectomy    . Colonoscopy  04-2001    internal hemorrhoids, panocolonic diverticulosis, and colon polyps   . Upper gastrointestinal endoscopy       Allergies  Allergen Reactions  . Other     Band Aids   . Tape   . Lipitor [Atorvastatin Calcium] Rash and Other (See Comments)    Muscle pain  . Niaspan  [Niacin Er] Rash and Other (See Comments)    Muscle pain      Family History  Problem Relation Age of Onset  . Colon cancer Daughter 73  . Heart disease Father   . Kidney disease Mother     kidney cancer     Social History [name] reports that she has never smoked. She has never used smokeless tobacco. [name] reports that she does not drink alcohol.   Review of Systems CONSTITUTIONAL: No weight loss, fever, chills, weakness or fatigue.  HEENT: Eyes: No visual loss, blurred vision, double vision or yellow sclerae.No hearing loss, sneezing, congestion, runny nose or sore throat.  SKIN: No rash or itching.  CARDIOVASCULAR: per HPI RESPIRATORY: No shortness of breath, cough or sputum.  GASTROINTESTINAL: No anorexia, nausea, vomiting or diarrhea. No abdominal pain or blood.  GENITOURINARY: No burning on urination, no polyuria NEUROLOGICAL: No headache, dizziness, syncope, paralysis, ataxia, numbness or tingling in the extremities. No change in bowel or bladder control.  MUSCULOSKELETAL: + leg pain.  LYMPHATICS: No enlarged nodes. No history of splenectomy.  PSYCHIATRIC: No history of depression or anxiety.  ENDOCRINOLOGIC: No reports of sweating, cold or heat intolerance. No polyuria or polydipsia.  Marland Kitchen   Physical Examination p 90 bp 134/80 Wt 211 lbs BMI 38 Gen: resting comfortably, no acute distress HEENT: no scleral icterus, pupils equal round and reactive, no palptable cervical adenopathy,  CV: irreg, no m/r/g, no JVD Resp: Clear to auscultation bilaterally GI: abdomen is soft, non-tender, non-distended, normal bowel sounds, no hepatosplenomegaly MSK: extremities are warm, no edema.  Skin: warm, no rash Neuro:  no focal deficits Psych: appropriate affect   Assessment and Plan  1. Afib - no current symptoms, continue dilt for rate contorl - continue eluqis for stroke prevention, CHADS2Vasc score is 4.  - repeat CBC and BMET on eliquis   F/u 4  months  Arnoldo Lenis, M.D., F.A.C.C.

## 2013-09-11 NOTE — Patient Instructions (Signed)
Your physician recommends that you schedule a follow-up appointment in: 4 months with Dr Harl Bowie  Your physician has recommended you make the following change in your medication:   [place]   Your physician recommends that you return for lab work today (BMET CBC)  Thank you for choosing [facility]!!

## 2013-09-16 ENCOUNTER — Telehealth: Payer: Self-pay | Admitting: *Deleted

## 2013-09-16 NOTE — Telephone Encounter (Signed)
Pt notified of results

## 2013-09-16 NOTE — Telephone Encounter (Signed)
Message copied by Desma Mcgregor on Mon Sep 16, 2013  3:21 PM ------      Message from: [place] F      Created: Mon Sep 16, 2013  2:21 PM       Labs look good ------

## 2013-09-27 ENCOUNTER — Telehealth: Payer: Self-pay | Admitting: Cardiology

## 2013-09-27 MED ORDER — DILTIAZEM HCL ER COATED BEADS 300 MG PO CP24: 300.0000 mg | ORAL_CAPSULE | Freq: Every day | ORAL | Status: DC

## 2013-09-27 NOTE — Telephone Encounter (Signed)
Refill request complete 

## 2013-09-27 NOTE — Telephone Encounter (Signed)
Patient needs reifill on Diltiazem sent to [facility] here in [place] / tgs

## 2014-09-11 ENCOUNTER — Ambulatory Visit (HOSPITAL_COMMUNITY)
Admission: RE | Admit: 2014-09-11 | Discharge: 2014-09-11 | Disposition: A | Discharge status: Home / Self Care | Payer: Medicare Other | Source: Ambulatory Visit | Attending: Internal Medicine | Admitting: Internal Medicine

## 2014-09-11 ENCOUNTER — Other Ambulatory Visit (HOSPITAL_COMMUNITY): Payer: Self-pay | Admitting: Internal Medicine

## 2014-09-11 DIAGNOSIS — M25562 Pain in left knee: Secondary | ICD-10-CM | POA: Diagnosis not present

## 2014-09-11 DIAGNOSIS — M533 Sacrococcygeal disorders, not elsewhere classified: Secondary | ICD-10-CM | POA: Insufficient documentation

## 2014-09-11 DIAGNOSIS — M8580 Other specified disorders of bone density and structure, unspecified site: Secondary | ICD-10-CM | POA: Insufficient documentation

## 2014-09-11 DIAGNOSIS — M25561 Pain in right knee: Secondary | ICD-10-CM | POA: Insufficient documentation

## 2014-09-11 DIAGNOSIS — M5418 Radiculopathy, sacral and sacrococcygeal region: Secondary | ICD-10-CM

## 2014-09-11 DIAGNOSIS — M545 Low back pain: Secondary | ICD-10-CM | POA: Diagnosis present

## 2014-09-11 DIAGNOSIS — M5136 Other intervertebral disc degeneration, lumbar region: Secondary | ICD-10-CM | POA: Diagnosis not present

## 2014-09-12 ENCOUNTER — Emergency Department (HOSPITAL_COMMUNITY): Payer: Medicare Other

## 2014-09-12 ENCOUNTER — Observation Stay (HOSPITAL_COMMUNITY): Payer: Medicare Other

## 2014-09-12 ENCOUNTER — Observation Stay (HOSPITAL_COMMUNITY)
Admission: EM | Admit: 2014-09-12 | Discharge: 2014-09-13 | Disposition: A | Discharge status: Home / Self Care | Payer: Medicare Other | Attending: Internal Medicine | Admitting: Internal Medicine

## 2014-09-12 ENCOUNTER — Encounter (HOSPITAL_COMMUNITY): Payer: Self-pay | Admitting: Emergency Medicine

## 2014-09-12 DIAGNOSIS — K760 Fatty (change of) liver, not elsewhere classified: Secondary | ICD-10-CM | POA: Diagnosis not present

## 2014-09-12 DIAGNOSIS — R410 Disorientation, unspecified: Secondary | ICD-10-CM

## 2014-09-12 DIAGNOSIS — R55 Syncope and collapse: Principal | ICD-10-CM | POA: Insufficient documentation

## 2014-09-12 DIAGNOSIS — J449 Chronic obstructive pulmonary disease, unspecified: Secondary | ICD-10-CM | POA: Diagnosis not present

## 2014-09-12 DIAGNOSIS — K317 Polyp of stomach and duodenum: Secondary | ICD-10-CM | POA: Insufficient documentation

## 2014-09-12 DIAGNOSIS — Z8701 Personal history of pneumonia (recurrent): Secondary | ICD-10-CM | POA: Diagnosis not present

## 2014-09-12 DIAGNOSIS — I4891 Unspecified atrial fibrillation: Secondary | ICD-10-CM | POA: Insufficient documentation

## 2014-09-12 DIAGNOSIS — G4700 Insomnia, unspecified: Secondary | ICD-10-CM | POA: Diagnosis not present

## 2014-09-12 DIAGNOSIS — E876 Hypokalemia: Secondary | ICD-10-CM | POA: Diagnosis present

## 2014-09-12 DIAGNOSIS — K219 Gastro-esophageal reflux disease without esophagitis: Secondary | ICD-10-CM | POA: Insufficient documentation

## 2014-09-12 DIAGNOSIS — D649 Anemia, unspecified: Secondary | ICD-10-CM | POA: Insufficient documentation

## 2014-09-12 DIAGNOSIS — I00 Rheumatic fever without heart involvement: Secondary | ICD-10-CM | POA: Diagnosis not present

## 2014-09-12 DIAGNOSIS — K5790 Diverticulosis of intestine, part unspecified, without perforation or abscess without bleeding: Secondary | ICD-10-CM | POA: Insufficient documentation

## 2014-09-12 DIAGNOSIS — I10 Essential (primary) hypertension: Secondary | ICD-10-CM | POA: Diagnosis not present

## 2014-09-12 DIAGNOSIS — F411 Generalized anxiety disorder: Secondary | ICD-10-CM | POA: Diagnosis not present

## 2014-09-12 DIAGNOSIS — E780 Pure hypercholesterolemia: Secondary | ICD-10-CM | POA: Diagnosis not present

## 2014-09-12 DIAGNOSIS — G9340 Encephalopathy, unspecified: Secondary | ICD-10-CM | POA: Diagnosis not present

## 2014-09-12 DIAGNOSIS — J9610 Chronic respiratory failure, unspecified whether with hypoxia or hypercapnia: Secondary | ICD-10-CM | POA: Diagnosis not present

## 2014-09-12 DIAGNOSIS — E559 Vitamin D deficiency, unspecified: Secondary | ICD-10-CM | POA: Insufficient documentation

## 2014-09-12 DIAGNOSIS — G608 Other hereditary and idiopathic neuropathies: Secondary | ICD-10-CM | POA: Insufficient documentation

## 2014-09-12 DIAGNOSIS — R4182 Altered mental status, unspecified: Secondary | ICD-10-CM | POA: Diagnosis present

## 2014-09-12 DIAGNOSIS — R739 Hyperglycemia, unspecified: Secondary | ICD-10-CM | POA: Diagnosis present

## 2014-09-12 DIAGNOSIS — M797 Fibromyalgia: Secondary | ICD-10-CM | POA: Diagnosis not present

## 2014-09-12 DIAGNOSIS — Z9981 Dependence on supplemental oxygen: Secondary | ICD-10-CM | POA: Insufficient documentation

## 2014-09-12 DIAGNOSIS — R0902 Hypoxemia: Secondary | ICD-10-CM

## 2014-09-12 DIAGNOSIS — J9611 Chronic respiratory failure with hypoxia: Secondary | ICD-10-CM | POA: Diagnosis present

## 2014-09-12 DIAGNOSIS — M549 Dorsalgia, unspecified: Secondary | ICD-10-CM | POA: Insufficient documentation

## 2014-09-12 DIAGNOSIS — N189 Chronic kidney disease, unspecified: Secondary | ICD-10-CM | POA: Diagnosis present

## 2014-09-12 DIAGNOSIS — K648 Other hemorrhoids: Secondary | ICD-10-CM | POA: Diagnosis not present

## 2014-09-12 LAB — CBC
HCT: 43.6 % (ref 36.0–46.0)
HCT: 41.9 % (ref 36.0–46.0)
HEMOGLOBIN: 13.9 g/dL (ref 12.0–15.0)
Hemoglobin: 13.2 g/dL (ref 12.0–15.0)
MCH: 30.9 pg (ref 26.0–34.0)
MCH: 30.3 pg (ref 26.0–34.0)
MCHC: 31.9 g/dL (ref 30.0–36.0)
MCHC: 31.5 g/dL (ref 30.0–36.0)
MCV: 96.9 fL (ref 78.0–100.0)
MCV: 96.3 fL (ref 78.0–100.0)
PLATELETS: 107 10*3/uL — AB (ref 150–400)
Platelets: 111 10*3/uL — ABNORMAL LOW (ref 150–400)
RBC: 4.5 MIL/uL (ref 3.87–5.11)
RBC: 4.35 MIL/uL (ref 3.87–5.11)
RDW: 15.5 % (ref 11.5–15.5)
RDW: 15.6 % — AB (ref 11.5–15.5)
WBC: 10.4 10*3/uL (ref 4.0–10.5)
WBC: 9.1 10*3/uL (ref 4.0–10.5)

## 2014-09-12 LAB — URINALYSIS, ROUTINE W REFLEX MICROSCOPIC
BILIRUBIN URINE: NEGATIVE
GLUCOSE, UA: NEGATIVE mg/dL
Hgb urine dipstick: NEGATIVE
Ketones, ur: NEGATIVE mg/dL
LEUKOCYTES UA: NEGATIVE
Nitrite: NEGATIVE
Protein, ur: NEGATIVE mg/dL
Specific Gravity, Urine: 1.025 (ref 1.005–1.030)
Urobilinogen, UA: 0.2 mg/dL (ref 0.0–1.0)
pH: 6 (ref 5.0–8.0)

## 2014-09-12 LAB — COMPREHENSIVE METABOLIC PANEL
ALBUMIN: 3.3 g/dL — AB (ref 3.5–5.0)
ALT: 38 U/L (ref 14–54)
AST: 63 U/L — AB (ref 15–41)
Alkaline Phosphatase: 64 U/L (ref 38–126)
Anion gap: 11 (ref 5–15)
BUN: 16 mg/dL (ref 6–20)
CALCIUM: 9.1 mg/dL (ref 8.9–10.3)
CHLORIDE: 100 mmol/L — AB (ref 101–111)
CO2: 29 mmol/L (ref 22–32)
Creatinine, Ser: 1.16 mg/dL — ABNORMAL HIGH (ref 0.44–1.00)
GFR calc Af Amer: 49 mL/min — ABNORMAL LOW (ref >60)
GFR, EST NON AFRICAN AMERICAN: 42 mL/min — AB (ref >60)
GLUCOSE: 179 mg/dL — AB (ref 65–99)
Potassium: 3.2 mmol/L — ABNORMAL LOW (ref 3.5–5.1)
SODIUM: 140 mmol/L (ref 135–145)
Total Bilirubin: 0.8 mg/dL (ref 0.3–1.2)
Total Protein: 5.9 g/dL — ABNORMAL LOW (ref 6.5–8.1)

## 2014-09-12 LAB — DIFFERENTIAL
BASOS PCT: 0 % (ref 0–1)
Basophils Absolute: 0 10*3/uL (ref 0.0–0.1)
Eosinophils Absolute: 0 10*3/uL (ref 0.0–0.7)
Eosinophils Relative: 0 % (ref 0–5)
LYMPHS ABS: 1.6 10*3/uL (ref 0.7–4.0)
Lymphocytes Relative: 16 % (ref 12–46)
Monocytes Absolute: 1.4 10*3/uL — ABNORMAL HIGH (ref 0.1–1.0)
Monocytes Relative: 14 % — ABNORMAL HIGH (ref 3–12)
NEUTROS ABS: 7.1 10*3/uL (ref 1.7–7.7)
Neutrophils Relative %: 70 % (ref 43–77)

## 2014-09-12 LAB — CREATININE, SERUM
Creatinine, Ser: 1.08 mg/dL — ABNORMAL HIGH (ref 0.44–1.00)
GFR, EST AFRICAN AMERICAN: 53 mL/min — AB (ref >60)
GFR, EST NON AFRICAN AMERICAN: 46 mL/min — AB (ref >60)

## 2014-09-12 LAB — MAGNESIUM: MAGNESIUM: 1.8 mg/dL (ref 1.7–2.4)

## 2014-09-12 LAB — GLUCOSE, CAPILLARY
GLUCOSE-CAPILLARY: 167 mg/dL — AB (ref 65–99)
Glucose-Capillary: 189 mg/dL — ABNORMAL HIGH (ref 65–99)

## 2014-09-12 LAB — PROTIME-INR
INR: 1.23 (ref 0.00–1.49)
Prothrombin Time: 15.7 seconds — ABNORMAL HIGH (ref 11.6–15.2)

## 2014-09-12 LAB — TSH: TSH: 1.177 u[IU]/mL (ref 0.350–4.500)

## 2014-09-12 LAB — APTT: aPTT: 27 seconds (ref 24–37)

## 2014-09-12 LAB — LIPASE, BLOOD: LIPASE: 17 U/L — AB (ref 22–51)

## 2014-09-12 LAB — VITAMIN B12: VITAMIN B 12: 771 pg/mL (ref 180–914)

## 2014-09-12 LAB — TROPONIN I: Troponin I: <0.03 ng/mL (ref <0.031)

## 2014-09-12 LAB — CBG MONITORING, ED: Glucose-Capillary: 172 mg/dL — ABNORMAL HIGH (ref 65–99)

## 2014-09-12 LAB — I-STAT TROPONIN, ED: Troponin i, poc: 0.01 ng/mL (ref 0.00–0.08)

## 2014-09-12 MED ORDER — OXYCODONE-ACETAMINOPHEN 7.5-325 MG PO TABS: 1.0000 | ORAL_TABLET | Freq: Four times a day (QID) | ORAL | Status: DC | PRN

## 2014-09-12 MED ORDER — PREGABALIN 50 MG PO CAPS
50.0000 mg | ORAL_CAPSULE | Freq: Two times a day (BID) | ORAL | Status: DC
  Administered 2014-09-12 – 2014-09-13 (×3): 50 mg via ORAL
  Filled 2014-09-12 (×3): qty 1

## 2014-09-12 MED ORDER — LORAZEPAM 0.5 MG PO TABS
0.5000 mg | ORAL_TABLET | Freq: Four times a day (QID) | ORAL | Status: DC | PRN
  Administered 2014-09-13: 0.5 mg via ORAL
  Filled 2014-09-12: qty 1

## 2014-09-12 MED ORDER — INSULIN ASPART 100 UNIT/ML ~~LOC~~ SOLN: 0.0000 [IU] | Freq: Every day | SUBCUTANEOUS | Status: DC

## 2014-09-12 MED ORDER — ROSUVASTATIN CALCIUM 20 MG PO TABS
20.0000 mg | ORAL_TABLET | Freq: Every day | ORAL | Status: DC
  Administered 2014-09-12: 20 mg via ORAL
  Filled 2014-09-12: qty 1

## 2014-09-12 MED ORDER — IPRATROPIUM-ALBUTEROL 0.5-2.5 (3) MG/3ML IN SOLN
3.0000 mL | Freq: Three times a day (TID) | RESPIRATORY_TRACT | Status: DC
  Administered 2014-09-13 (×2): 3 mL via RESPIRATORY_TRACT
  Filled 2014-09-12 (×2): qty 3

## 2014-09-12 MED ORDER — DULOXETINE HCL 60 MG PO CPEP
60.0000 mg | ORAL_CAPSULE | Freq: Every day | ORAL | Status: DC
  Administered 2014-09-12 – 2014-09-13 (×2): 60 mg via ORAL
  Filled 2014-09-12 (×2): qty 1

## 2014-09-12 MED ORDER — ACETAMINOPHEN 325 MG PO TABS
650.0000 mg | ORAL_TABLET | Freq: Four times a day (QID) | ORAL | Status: DC | PRN
  Administered 2014-09-12: 650 mg via ORAL
  Filled 2014-09-12: qty 2

## 2014-09-12 MED ORDER — ALBUTEROL SULFATE HFA 108 (90 BASE) MCG/ACT IN AERS: 2.0000 | INHALATION_SPRAY | Freq: Four times a day (QID) | RESPIRATORY_TRACT | Status: DC | PRN

## 2014-09-12 MED ORDER — FERROUS GLUCONATE 324 (38 FE) MG PO TABS
325.0000 mg | ORAL_TABLET | Freq: Every day | ORAL | Status: DC
  Administered 2014-09-12 – 2014-09-13 (×2): 325 mg via ORAL
  Filled 2014-09-12 (×4): qty 1

## 2014-09-12 MED ORDER — GUAIFENESIN-DM 100-10 MG/5ML PO SYRP
5.0000 mL | ORAL_SOLUTION | Freq: Four times a day (QID) | ORAL | Status: DC
  Administered 2014-09-12 – 2014-09-13 (×3): 5 mL via ORAL
  Filled 2014-09-12 (×3): qty 5

## 2014-09-12 MED ORDER — ONDANSETRON HCL 4 MG PO TABS: 4.0000 mg | ORAL_TABLET | Freq: Four times a day (QID) | ORAL | Status: DC | PRN

## 2014-09-12 MED ORDER — IPRATROPIUM-ALBUTEROL 0.5-2.5 (3) MG/3ML IN SOLN
3.0000 mL | Freq: Four times a day (QID) | RESPIRATORY_TRACT | Status: DC
  Administered 2014-09-12: 3 mL via RESPIRATORY_TRACT
  Filled 2014-09-12: qty 3

## 2014-09-12 MED ORDER — POLYETHYLENE GLYCOL 3350 17 G PO PACK: 17.0000 g | PACK | Freq: Every day | ORAL | Status: DC | PRN

## 2014-09-12 MED ORDER — IOHEXOL 350 MG/ML SOLN
100.0000 mL | Freq: Once | INTRAVENOUS | Status: AC | PRN
  Administered 2014-09-12: 100 mL via INTRAVENOUS

## 2014-09-12 MED ORDER — ALBUTEROL SULFATE (2.5 MG/3ML) 0.083% IN NEBU: 2.5000 mg | INHALATION_SOLUTION | Freq: Four times a day (QID) | RESPIRATORY_TRACT | Status: DC | PRN

## 2014-09-12 MED ORDER — ALUM & MAG HYDROXIDE-SIMETH 200-200-20 MG/5ML PO SUSP: 30.0000 mL | Freq: Four times a day (QID) | ORAL | Status: DC | PRN

## 2014-09-12 MED ORDER — PANTOPRAZOLE SODIUM 40 MG PO TBEC
40.0000 mg | DELAYED_RELEASE_TABLET | Freq: Two times a day (BID) | ORAL | Status: DC
  Administered 2014-09-12 – 2014-09-13 (×3): 40 mg via ORAL
  Filled 2014-09-12 (×3): qty 1

## 2014-09-12 MED ORDER — INSULIN ASPART 100 UNIT/ML ~~LOC~~ SOLN
0.0000 [IU] | Freq: Three times a day (TID) | SUBCUTANEOUS | Status: DC
  Administered 2014-09-12: 3 [IU] via SUBCUTANEOUS
  Administered 2014-09-13: 5 [IU] via SUBCUTANEOUS
  Administered 2014-09-13: 2 [IU] via SUBCUTANEOUS

## 2014-09-12 MED ORDER — RALOXIFENE HCL 60 MG PO TABS
60.0000 mg | ORAL_TABLET | Freq: Every day | ORAL | Status: DC
  Administered 2014-09-12 – 2014-09-13 (×2): 60 mg via ORAL
  Filled 2014-09-12 (×2): qty 1

## 2014-09-12 MED ORDER — ACETAMINOPHEN 650 MG RE SUPP: 650.0000 mg | Freq: Four times a day (QID) | RECTAL | Status: DC | PRN

## 2014-09-12 MED ORDER — ONDANSETRON HCL 4 MG/2ML IJ SOLN: 4.0000 mg | Freq: Four times a day (QID) | INTRAMUSCULAR | Status: DC | PRN

## 2014-09-12 MED ORDER — FOLIC ACID 1 MG PO TABS
1.0000 mg | ORAL_TABLET | Freq: Every day | ORAL | Status: DC
  Administered 2014-09-12 – 2014-09-13 (×2): 1 mg via ORAL
  Filled 2014-09-12 (×2): qty 1

## 2014-09-12 MED ORDER — PREDNISONE 10 MG PO TABS
5.0000 mg | ORAL_TABLET | Freq: Every day | ORAL | Status: DC
  Administered 2014-09-12 – 2014-09-13 (×2): 5 mg via ORAL
  Filled 2014-09-12 (×2): qty 1

## 2014-09-12 MED ORDER — SODIUM CHLORIDE 0.9 % IV SOLN
INTRAVENOUS | Status: DC
  Administered 2014-09-12 (×2): via INTRAVENOUS

## 2014-09-12 MED ORDER — POTASSIUM CHLORIDE CRYS ER 20 MEQ PO TBCR
40.0000 meq | EXTENDED_RELEASE_TABLET | ORAL | Status: AC
  Administered 2014-09-12 (×2): 40 meq via ORAL
  Filled 2014-09-12 (×2): qty 2

## 2014-09-12 MED ORDER — VITAMIN B-12 100 MCG PO TABS
100.0000 ug | ORAL_TABLET | Freq: Every day | ORAL | Status: DC
  Administered 2014-09-12 – 2014-09-13 (×2): 100 ug via ORAL
  Filled 2014-09-12 (×4): qty 1

## 2014-09-12 MED ORDER — APIXABAN 5 MG PO TABS
2.5000 mg | ORAL_TABLET | Freq: Two times a day (BID) | ORAL | Status: DC
  Administered 2014-09-12 – 2014-09-13 (×3): 2.5 mg via ORAL
  Filled 2014-09-12 (×3): qty 1

## 2014-09-12 MED ORDER — DILTIAZEM HCL ER COATED BEADS 180 MG PO CP24
300.0000 mg | ORAL_CAPSULE | Freq: Every day | ORAL | Status: DC
  Administered 2014-09-12 – 2014-09-13 (×2): 300 mg via ORAL
  Filled 2014-09-12 (×4): qty 1

## 2014-09-12 MED ORDER — MOMETASONE FURO-FORMOTEROL FUM 100-5 MCG/ACT IN AERO
2.0000 | INHALATION_SPRAY | Freq: Two times a day (BID) | RESPIRATORY_TRACT | Status: DC
  Administered 2014-09-12 – 2014-09-13 (×2): 2 via RESPIRATORY_TRACT
  Filled 2014-09-12: qty 8.8

## 2014-09-12 NOTE — Evaluation (Addendum)
Physical Therapy Evaluation Patient Details Name: SHAHZODA Dunn MRN: [id number] DOB: 1928-08-05 Today's Date: 09/12/2014   History of Present Illness  [name] is a 81 y.o. female who presents to the Emergency Department by ambulance complaining of a Possible fall that occurred earlier this morning. Pt's daughter notes that the caregiver arrived this morning and found the patient on the floor of the bathroom. Pt's daughter notes the caregiver said she was generally confused, complaining of generalized pain, and had vomited. The caregiver additionally notes that she was wearing different clothes from yesterday which led her to believe the fall didn't occur yesterday. Her daughter notes they all ate together last night and ate the same food and no one else experienced any nausea or vomiting. Pt complains of generalized pain across her body. Pt's daughter notes that she came to [facility] yesterday for an x-ray to her backside due to a fall that occurred 6/16. Pt's daughter denies any recent fevers or other associated symptoms. The patient complains of hurting all over.  Clinical Impression   Pt was seen for evaluation.  She is alert and cooperative, oriented to place and situation.  Her memory is poor and her daughter states that she is getting more and more forgetful.  She is found to be very close to prior functional level.  It is concerning to me that pt is alone a good part of the day and with progressive cognitive decline this is not an ideal situation.  I would recommend that she ultimately move into [facility] or with a family member.    Follow Up Recommendations No PT follow up    Equipment Recommendations  None recommended by PT    Recommendations for Other Services   none    Precautions / Restrictions Precautions Precautions: Fall Restrictions Weight Bearing Restrictions: No      Mobility  Bed Mobility Overal bed mobility: Needs Assistance Bed Mobility: Supine to  Sit     Supine to sit: Min assist;HOB elevated;Min guard     General bed mobility comments: HOB to 30 degrees, transfer is effortful but she is able to slowly complete  Transfers Overall transfer level: Needs assistance   Transfers: Sit to/from Stand Sit to Stand: Supervision         General transfer comment: Pt was able to stand at bedside with 2 hand support on the scale...she was able to stand about 30 seconds before tiring  Ambulation/Gait    unable           General Gait Details: not ambulatory  Stairs            Wheelchair Mobility    Modified Rankin (Stroke Patients Only)       Balance Overall balance assessment: Needs assistance Sitting-balance support: No upper extremity supported;Feet supported Sitting balance-Leahy Scale: Fair Sitting balance - Comments: pt is currently tired from the admission process.Marland KitchenMarland KitchenI anticipate that balance is normally WNL   Standing balance support: Bilateral upper extremity supported Standing balance-Leahy Scale: Fair                               Pertinent Vitals/Pain      Home Living Family/patient expects to be discharged to:: Private residence Living Arrangements: Alone Available Help at Discharge: Family;Personal care attendant Type of Home: House Home Access: Level entry     Home Layout: [place]: Environmental consultant - 4 wheels;Bedside commode;Shower seat;Wheelchair - manual Additional  Comments: has an aide from 8-2PM daily    Prior Function Level of Independence: Needs assistance   Gait / Transfers Assistance Needed: not ambulatory, able to transfer to w/c and self propel  ADL's / Homemaking Assistance Needed: assist with all homemaking and grooming        Hand Dominance        Extremity/Trunk Assessment               Lower Extremity Assessment: RLE deficits/detail;LLE deficits/detail RLE Deficits / Details: DJD of the knee but ROM is WNL LLE Deficits / Details: DJD  of the knee but ROM is WNL  Cervical / Trunk Assessment: Kyphotic  Communication   Communication: HOH  Cognition Arousal/Alertness: Awake/alert Behavior During Therapy: WFL for tasks assessed/performed Overall Cognitive Status:  (daughter states that pt has become more and more confused over time)                      General Comments      Exercises        Assessment/Plan    PT Assessment Patent does not need any further PT services  PT Diagnosis     PT Problem List    PT Treatment Interventions     PT Goals (Current goals can be found in the Care Plan section) Acute Rehab PT Goals PT Goal Formulation: All assessment and education complete, DC therapy    Frequency     Barriers to discharge        Co-evaluation               End of Session Equipment Utilized During Treatment: Gait belt Activity Tolerance: Patient limited by fatigue Patient left: in bed;with call bell/phone within reach;with bed alarm set Nurse Communication: Mobility status         Time: 1530-1559 PT Time Calculation (min) (ACUTE ONLY): 29 min   Charges:   PT Evaluation $Initial PT Evaluation Tier I: 1 Procedure     PT G Codes:    Clinical Judgement  Mobility  Evaluation:  CJ  Goal:  CJ  Discharge:  Shelbie Hutching  PT 09/12/2014, 4:12 PM [phone]

## 2014-09-12 NOTE — ED Notes (Signed)
Patient's O2 saturation 89% on room air. Placed patient on 2L O2, increased to 93%.

## 2014-09-12 NOTE — ED Notes (Addendum)
Patient brought in by EMS, states caregiver found patient in floor this morning. Last known seen well yesterday. States patient did not take evening medications. Patient complaining of left shoulder pain. Patient also complaining of lower back pain.

## 2014-09-12 NOTE — ED Provider Notes (Signed)
CSN: [id number]     Arrival date & time 09/12/14  0905 History  This chart was scribed for Becky Rank, MD by [name], ED scribe. This patient was seen in room APA18/APA18 and the patient's care was started at 9:12 AM.    Chief Complaint  Patient presents with  . Altered Mental Status  . Fall   Patient is a 81 y.o. female presenting with fall. The history is provided by the patient and a relative. The history is limited by the condition of the patient. No language interpreter was used.  Fall    HPI Comments: [name] is a 81 y.o. female who presents to the Emergency Department by ambulance complaining of a  Possible fall that occurred earlier this morning. Pt's daughter notes that the caregiver arrived this morning and found the patient on the floor of the bathroom. Pt's daughter notes the caregiver said she was generally confused, complaining of generalized pain, and had vomited. The caregiver additionally notes that she was wearing different clothes from yesterday which led her to believe the fall didn't occur yesterday. Her daughter notes they all ate together last night and ate the same food and no one else experienced any nausea or vomiting. Pt complains of generalized pain across her body. Pt's daughter notes that she came to [facility] yesterday for an x-ray to her backside due to a fall that occurred 6/16. Pt's daughter denies any recent fevers or other associated symptoms.  The patient complains of hurting all over.    Past Medical History  Diagnosis Date  . Anemia   . Back pain   . COPD (chronic obstructive pulmonary disease)   . Depression   . Fibromyalgia   . Hypercholesteremia   . Hypertension   . Insomnia   . Rheumatoid arteritis   . Cancer     Skin   . Vitamin D deficiency   . 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 Generalized anxiety disorder   . GERD (gastroesophageal reflux disease)   . Bacterial pneumonia   . Fatty liver   . Internal  hemorrhoids   . Diverticulosis   . Ulcer     stomach  . Gastric polyp 09/08/11    at the anastomosis-inflammatory  . Atrial fibrillation   . Chronic respiratory failure   . On home O2     qhs   Past Surgical History  Procedure Laterality Date  . Abdominal surgery      removed patial stomach from ulcer  . Abdominal hysterectomy    . Colonoscopy  04-2001    internal hemorrhoids, panocolonic diverticulosis, and colon polyps   . Upper gastrointestinal endoscopy     Family History  Problem Relation Age of Onset  . Colon cancer Daughter 57  . Heart disease Father   . Kidney disease Mother     kidney cancer   History  Substance Use Topics  . Smoking status: Never Smoker   . Smokeless tobacco: Never Used  . Alcohol Use: No   OB History    No data available     Review of Systems  Constitutional: Negative for fever and chills.  Gastrointestinal: Positive for nausea and vomiting.  Musculoskeletal: Positive for myalgias and back pain.  Psychiatric/Behavioral: Positive for confusion.  All other systems reviewed and are negative.     Allergies  Other; Tape; Lipitor; and Niaspan  Home Medications   Prior to Admission medications   Medication Sig Start Date End Date Taking? Authorizing Provider  albuterol (PROAIR HFA) 108 (90 BASE) MCG/ACT inhaler Inhale 2 puffs into the lungs every 6 (six) hours as needed. For rescue   Yes Historical Provider, MD  apixaban (ELIQUIS) 2.5 MG TABS tablet Take 2.5 mg by mouth 2 (two) times daily.   Yes Historical Provider, MD  Cholecalciferol (VITAMIN D3) 1000 UNITS CAPS Take 1 capsule by mouth daily.   Yes Historical Provider, MD  cyanocobalamin 100 MCG tablet Take 100 mcg by mouth daily.   Yes Historical Provider, MD  diltiazem (CARDIZEM CD) 300 MG 24 hr capsule Take 1 capsule (300 mg total) by mouth daily. 09/27/13  Yes Arnoldo Lenis, MD  DULoxetine (CYMBALTA) 60 MG capsule Take 60 mg by mouth daily.     Yes Historical Provider, MD   ferrous gluconate (FERGON) 325 MG tablet Take 325 mg by mouth daily.    Yes Historical Provider, MD  Fluticasone-Salmeterol (ADVAIR DISKUS) 250-50 MCG/DOSE AEPB Inhale 1 puff into the lungs every 12 (twelve) hours.   Yes Historical Provider, MD  folic acid (FOLVITE) 1 MG tablet Take 1 mg by mouth daily.   Yes Historical Provider, MD  LORazepam (ATIVAN) 0.5 MG tablet Take 1 tablet (0.5 mg total) by mouth every 6 (six) hours as needed for anxiety. 07/29/13  Yes Geradine Girt, DO  methotrexate (RHEUMATREX) 2.5 MG tablet Take 12.5 mg by mouth once a week. *Taken on Thursdays at 1200Caution:Chemotherapy. Protect from light.   Yes Historical Provider, MD  oxyCODONE-acetaminophen (PERCOCET) 7.5-325 MG per tablet Take 1 tablet by mouth 4 (four) times daily as needed. 07/29/13  Yes Jessica U Vann, DO  pantoprazole (PROTONIX) 40 MG tablet Take 40 mg by mouth 2 (two) times daily.    Yes Historical Provider, MD  polyethylene glycol (MIRALAX / GLYCOLAX) packet Take 17 g by mouth daily as needed for mild constipation or moderate constipation.    Yes Historical Provider, MD  predniSONE (DELTASONE) 5 MG tablet Take 5 mg by mouth daily.     Yes Historical Provider, MD  pregabalin (LYRICA) 50 MG capsule Take 50 mg by mouth 2 (two) times daily.    Yes Historical Provider, MD  Probiotic Product (PROBIOTIC FORMULA PO) Take 1 tablet by mouth daily.    Yes Historical Provider, MD  raloxifene (EVISTA) 60 MG tablet Take 60 mg by mouth daily.   Yes Historical Provider, MD  rosuvastatin (CRESTOR) 20 MG tablet Take 20 mg by mouth daily.    Yes Historical Provider, MD  vitamin C (ASCORBIC ACID) 500 MG tablet Take 500 mg by mouth daily.   Yes Historical Provider, MD  guaiFENesin-dextromethorphan (ROBITUSSIN DM) 100-10 MG/5ML syrup Take 5 mLs by mouth every 6 (six) hours. Patient not taking: Reported on 09/12/2014 07/29/13   Becky Bamberger Vann, DO   BP 125/63 mmHg  Pulse 100  Temp(Src) 99.3 F (37.4 C) (Oral)  Resp 18  Ht 5\' 3"   (1.6 m)  Wt 220 lb (99.791 kg)  BMI 38.98 kg/m2  SpO2 93% Physical Exam  Constitutional: No distress.  Elderly   HENT:  Head: Normocephalic and atraumatic.  Right Ear: External ear normal.  Left Ear: External ear normal.  Mouth/Throat: No oropharyngeal exudate.  Eyes: Conjunctivae are normal. Right eye exhibits no discharge. Left eye exhibits no discharge. No scleral icterus.  Neck: Neck supple. No tracheal deviation present.  Cardiovascular: Normal rate and intact distal pulses.  An irregularly irregular rhythm present.  Pulmonary/Chest: Effort normal and breath sounds normal. No stridor. No respiratory distress. She has no  wheezes. She has no rales.  Abdominal: Soft. Bowel sounds are normal. She exhibits no distension. There is no tenderness. There is no rebound and no guarding.  Musculoskeletal: She exhibits no edema.       Lumbar back: She exhibits tenderness and bony tenderness.  Neurological: She is disoriented. No cranial nerve deficit (no facial droop, extraocular movements intact, no slurred speech) or sensory deficit. She exhibits normal muscle tone. She displays no seizure activity. GCS eye subscore is 4. GCS verbal subscore is 4. GCS motor subscore is 6.  Equal grip and plantar strength bilaterally, generally weak  Skin: Skin is warm and dry. No rash noted. She is not diaphoretic.  Psychiatric: She has a normal mood and affect.  Nursing note and vitals reviewed.   ED Course  Procedures  DIAGNOSTIC STUDIES: Oxygen Saturation is 89% on RA, adequate by my interpretation.    COORDINATION OF CARE: 9:19 AM Discussed treatment plan with pt at bedside and pt agreed to plan.  Labs Review Labs Reviewed  CBC - Abnormal; Notable for the following:    Platelets 111 (*)    All other components within normal limits  COMPREHENSIVE METABOLIC PANEL - Abnormal; Notable for the following:    Potassium 3.2 (*)    Chloride 100 (*)    Glucose, Bld 179 (*)    Creatinine, Ser 1.16 (*)     Total Protein 5.9 (*)    Albumin 3.3 (*)    AST 63 (*)    GFR calc non Af Amer 42 (*)    GFR calc Af Amer 49 (*)    All other components within normal limits  PROTIME-INR - Abnormal; Notable for the following:    Prothrombin Time 15.7 (*)    All other components within normal limits  DIFFERENTIAL - Abnormal; Notable for the following:    Monocytes Relative 14 (*)    Monocytes Absolute 1.4 (*)    All other components within normal limits  LIPASE, BLOOD - Abnormal; Notable for the following:    Lipase 17 (*)    All other components within normal limits  CBG MONITORING, ED - Abnormal; Notable for the following:    Glucose-Capillary 172 (*)    All other components within normal limits  APTT  URINALYSIS, ROUTINE W REFLEX MICROSCOPIC (NOT AT [facility])  Randolm Idol, ED    Imaging Review Dg Sacrum/coccyx  09/11/2014   CLINICAL DATA:  Status post fall with low back and bilateral knee pain  EXAM: SACRUM AND COCCYX - 2+ VIEW  COMPARISON:  Limited views of the sacrum from a lumbar spine series of April 27, 2010  FINDINGS: There degenerative disc space narrowing at L4-5 and L5-S1. The sacrum is osteopenic. There are at least 3 intact sacral struts observed. The SI joints are unremarkable. The lateral film reveals no evidence of acute fracture or dislocation of the sacral or coccygeal segments. The observed portions of the bony pelvis are intact.  IMPRESSION: There is diffuse osteopenia. There is no acute sacral or coccygeal fracture. There are degenerative changes of the lower lumbar discs.   Electronically Signed   By: David  Martinique M.D.   On: 09/11/2014 14:34   Ct Head Wo Contrast  09/12/2014   CLINICAL DATA:  POSSIBLE FALL THIS AM, PT WAS FOUND ON FLOOR BY CAREGIVER, C/O ALL OVER GENERALIZED PAIN, PT HAD RECENT FALL ON 6.16.2016, PT C/O NAUSEA  EXAM: CT HEAD WITHOUT CONTRAST  CT CERVICAL SPINE WITHOUT CONTRAST  TECHNIQUE: Multidetector CT imaging of  the head and cervical spine was performed  following the standard protocol without intravenous contrast. Multiplanar CT image reconstructions of the cervical spine were also generated.  COMPARISON:  07/06/2009  FINDINGS: CT HEAD FINDINGS  Ventricles normal configuration. There is ventricular and sulcal enlargement reflecting age related volume loss. No hydrocephalus.  There are no parenchymal masses or mass effect. There is no evidence of a cortical infarct. Mild patchy white matter hypoattenuation is noted consistent with chronic microvascular ischemic change.  There are no extra-axial masses or abnormal fluid collections.  No intracranial hemorrhage.  No skull fracture. Visualized sinuses and mastoid air cells are clear.  CT CERVICAL SPINE FINDINGS  No fracture. Mild reversal of the normal cervical lordosis centered at C5. Grade 1 anterolisthesis of C3 on C4. Moderate loss of disc height at C4-C5 and C5-C6. Facet spurring is noted most prominently at C3-C4 on the left.  Bones are demineralized.  There are hypo attenuating left thyroid nodules similar to the prior CT, largest measuring 15 mm.  Lung apices are clear.  IMPRESSION: HEAD CT:  No acute intracranial abnormalities.  No skull fracture.  CERVICAL CT:  No fracture or acute finding.   Electronically Signed   By: Lajean Manes M.D.   On: 09/12/2014 10:41   Ct Cervical Spine Wo Contrast  09/12/2014   CLINICAL DATA:  POSSIBLE FALL THIS AM, PT WAS FOUND ON FLOOR BY CAREGIVER, C/O ALL OVER GENERALIZED PAIN, PT HAD RECENT FALL ON 6.16.2016, PT C/O NAUSEA  EXAM: CT HEAD WITHOUT CONTRAST  CT CERVICAL SPINE WITHOUT CONTRAST  TECHNIQUE: Multidetector CT imaging of the head and cervical spine was performed following the standard protocol without intravenous contrast. Multiplanar CT image reconstructions of the cervical spine were also generated.  COMPARISON:  07/06/2009  FINDINGS: CT HEAD FINDINGS  Ventricles normal configuration. There is ventricular and sulcal enlargement reflecting age related volume loss.  No hydrocephalus.  There are no parenchymal masses or mass effect. There is no evidence of a cortical infarct. Mild patchy white matter hypoattenuation is noted consistent with chronic microvascular ischemic change.  There are no extra-axial masses or abnormal fluid collections.  No intracranial hemorrhage.  No skull fracture. Visualized sinuses and mastoid air cells are clear.  CT CERVICAL SPINE FINDINGS  No fracture. Mild reversal of the normal cervical lordosis centered at C5. Grade 1 anterolisthesis of C3 on C4. Moderate loss of disc height at C4-C5 and C5-C6. Facet spurring is noted most prominently at C3-C4 on the left.  Bones are demineralized.  There are hypo attenuating left thyroid nodules similar to the prior CT, largest measuring 15 mm.  Lung apices are clear.  IMPRESSION: HEAD CT:  No acute intracranial abnormalities.  No skull fracture.  CERVICAL CT:  No fracture or acute finding.   Electronically Signed   By: Lajean Manes M.D.   On: 09/12/2014 10:41   Dg Abd Acute W/chest  09/12/2014   CLINICAL DATA:  Fall.  Confusion and vomiting.  EXAM: DG ABDOMEN ACUTE W/ 1V CHEST  COMPARISON:  04/15/2010  FINDINGS: Normal bowel gas pattern. No evidence of obstruction or generalized adynamic ileus. No free air.  No evidence of renal or ureteral stones.  Lungs essentially clear.  Degenerative changes noted throughout visualized spine. Bones are demineralized.  IMPRESSION: No acute findings in the abdomen or pelvis. No evidence of bowel obstruction or free intraperitoneal air.  No acute cardiopulmonary disease.   Electronically Signed   By: Lajean Manes M.D.   On: 09/12/2014 10:33  EKG Interpretation   Date/Time:  Friday September 12 2014 09:06:46 EDT Ventricular Rate:  109 PR Interval:    QRS Duration: 85 QT Interval:  428 QTC Calculation: 576 R Axis:   41 Text Interpretation:  Atrial fibrillation Borderline low voltage,  extremity leads Minimal ST depression, lateral leads Prolonged QT interval  No  significant change since last tracing Confirmed by Ambrose Wile  MD-J, Hadleigh Felber  [id number]) on 09/12/2014 9:13:05 AM      MDM   Final diagnoses:  Syncope, unspecified syncope type  Confusion   Pt does not have any sign of fracture or serious injury associated with the presumed fall.  Pt has had vomiting and confusion.  No acute stroke or hemorrhage noted on CT but cannot exclude occult cva.  Will continue to monitor cardiac rhyrhm.  Mild hypoxia but patient does have COPD.  NO pna noted on xray.  Will consult with hospitalist regarding admission. I personally performed the services described in this documentation, which was scribed in my presence.  The recorded information has been reviewed and is accurate.    Becky Rank, MD 09/12/14 [phone]

## 2014-09-12 NOTE — H&P (Signed)
[facility] History and Physical  [name] [id number] DOB: February 24, 1929 DOA: 09/12/2014  Referring physician: Tomi Bamberger PCP: Delphina Cahill, MD   Chief Complaint: syncope  HPI: [name] is a 81 y.o. female with a past medical history that includes chronic respiratory failure related to COPD on home O2, chronic pain, GERD, A. fib on elaquis presents to the emergency department chief complaint of fall/syncope. Information obtained from the daughter who reports caregiver arrived at patient's house and palpation on the bathroom floor. Patient was weak confused complained of generalized pain and had vomited. Daughter reports she saw the patient last night patient was in her usual state of health no complaints of nausea vomiting worsening discomfort. Of note patient was seen in the emergency department yesterday after a fall that occurred 2 weeks ago. Patient complained of low back pain these x-rays were negative. No report of fever chills worsening cough difficulty swallowing. Workup in the emergency department significant for platelets of 111 potassium 3.2 chloride 100 creatinine 1.16 serum glucose 179. KG with A. fib at a rate of 109 She has low-grade temperature 99.4 she's hemodynamically stable mildly tachycardic with hypoxia on room air   Review of Systems:  10 point review of systems complete and all systems negative as reported by caregiver and noted in the history of present illness Past Medical History  Diagnosis Date  . Anemia   . Back pain   . COPD (chronic obstructive pulmonary disease)   . Depression   . Fibromyalgia   . Hypercholesteremia   . Hypertension   . Insomnia   . Rheumatoid arteritis   . Cancer     Skin   . Vitamin D deficiency   . 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 Generalized anxiety disorder   . GERD (gastroesophageal reflux disease)   . Bacterial pneumonia   . Fatty liver   . Internal hemorrhoids   . Diverticulosis   . Ulcer    stomach  . Gastric polyp 09/08/11    at the anastomosis-inflammatory  . Atrial fibrillation   . Chronic respiratory failure   . On home O2     qhs   Past Surgical History  Procedure Laterality Date  . Abdominal surgery      removed patial stomach from ulcer  . Abdominal hysterectomy    . Colonoscopy  04-2001    internal hemorrhoids, panocolonic diverticulosis, and colon polyps   . Upper gastrointestinal endoscopy     Social History:  reports that she has never smoked. She has never used smokeless tobacco. She reports that she does not drink alcohol or use illicit drugs.  Allergies  Allergen Reactions  . Other     Band Aids   . Tape   . Lipitor [Atorvastatin Calcium] Rash and Other (See Comments)    Muscle pain  . Niaspan [Niacin Er] Rash and Other (See Comments)    Muscle pain    Family History  Problem Relation Age of Onset  . Colon cancer Daughter 40  . Heart disease Father   . Kidney disease Mother     kidney cancer     Prior to Admission medications   Medication Sig Start Date End Date Taking? Authorizing Provider  albuterol (PROAIR HFA) 108 (90 BASE) MCG/ACT inhaler Inhale 2 puffs into the lungs every 6 (six) hours as needed. For rescue   Yes Historical Provider, MD  apixaban (ELIQUIS) 2.5 MG TABS tablet Take 2.5 mg by mouth 2 (two) times daily.   Yes  Historical Provider, MD  Cholecalciferol (VITAMIN D3) 1000 UNITS CAPS Take 1 capsule by mouth daily.   Yes Historical Provider, MD  cyanocobalamin 100 MCG tablet Take 100 mcg by mouth daily.   Yes Historical Provider, MD  diltiazem (CARDIZEM CD) 300 MG 24 hr capsule Take 1 capsule (300 mg total) by mouth daily. 09/27/13  Yes Arnoldo Lenis, MD  DULoxetine (CYMBALTA) 60 MG capsule Take 60 mg by mouth daily.     Yes Historical Provider, MD  ferrous gluconate (FERGON) 325 MG tablet Take 325 mg by mouth daily.    Yes Historical Provider, MD  Fluticasone-Salmeterol (ADVAIR DISKUS) 250-50 MCG/DOSE AEPB Inhale 1 puff into  the lungs every 12 (twelve) hours.   Yes Historical Provider, MD  folic acid (FOLVITE) 1 MG tablet Take 1 mg by mouth daily.   Yes Historical Provider, MD  LORazepam (ATIVAN) 0.5 MG tablet Take 1 tablet (0.5 mg total) by mouth every 6 (six) hours as needed for anxiety. 07/29/13  Yes Geradine Girt, DO  methotrexate (RHEUMATREX) 2.5 MG tablet Take 12.5 mg by mouth once a week. *Taken on Thursdays at 1200Caution:Chemotherapy. Protect from light.   Yes Historical Provider, MD  oxyCODONE-acetaminophen (PERCOCET) 7.5-325 MG per tablet Take 1 tablet by mouth 4 (four) times daily as needed. 07/29/13  Yes Jessica U Vann, DO  pantoprazole (PROTONIX) 40 MG tablet Take 40 mg by mouth 2 (two) times daily.    Yes Historical Provider, MD  polyethylene glycol (MIRALAX / GLYCOLAX) packet Take 17 g by mouth daily as needed for mild constipation or moderate constipation.    Yes Historical Provider, MD  predniSONE (DELTASONE) 5 MG tablet Take 5 mg by mouth daily.     Yes Historical Provider, MD  pregabalin (LYRICA) 50 MG capsule Take 50 mg by mouth 2 (two) times daily.    Yes Historical Provider, MD  Probiotic Product (PROBIOTIC FORMULA PO) Take 1 tablet by mouth daily.    Yes Historical Provider, MD  raloxifene (EVISTA) 60 MG tablet Take 60 mg by mouth daily.   Yes Historical Provider, MD  rosuvastatin (CRESTOR) 20 MG tablet Take 20 mg by mouth daily.    Yes Historical Provider, MD  vitamin C (ASCORBIC ACID) 500 MG tablet Take 500 mg by mouth daily.   Yes Historical Provider, MD  guaiFENesin-dextromethorphan (ROBITUSSIN DM) 100-10 MG/5ML syrup Take 5 mLs by mouth every 6 (six) hours. Patient not taking: Reported on 09/12/2014 07/29/13   Geradine Girt, DO   Physical Exam: Filed Vitals:   09/12/14 0901 09/12/14 1030 09/12/14 1034 09/12/14 1226  BP: 144/79 125/63    Pulse: 99  100   Temp: 99.3 F (37.4 C)   99.4 F (37.4 C)  TempSrc: Oral     Resp: 18     Height: 5\' 3"  (1.6 m)     Weight: 99.791 kg (220 lb)       SpO2: 89%  93%     Wt Readings from Last 3 Encounters:  09/12/14 99.791 kg (220 lb)  09/11/13 95.709 kg (211 lb)  08/07/13 95.709 kg (211 lb)    General:  Appears highly anxious and slightly uncomfortable obese Eyes: PERRL, normal lids, irises & conjunctiva ENT: grossly normal hearing, lips & tongue, his membranes of her mouth pink but slightly dry Neck: no LAD, masses or thyromegaly Cardiovascular: Irregularly irregular no murmur no gallop no lower extremity edema. Telemetry: SR, no arrhythmias  Respiratory: CTA bilaterally, no w/r/r. Normal respiratory effort. Abdomen: soft, ntnd Skin:  no rash or induration seen on limited exam Musculoskeletal: grossly normal tone BUE/BLE Psychiatric: grossly normal mood and affect, speech fluent and appropriate Neurologic: grossly non-focal.          Labs on Admission:  Basic Metabolic Panel:  Recent Labs Lab 09/12/14 0942  NA 140  K 3.2*  CL 100*  CO2 29  GLUCOSE 179*  BUN 16  CREATININE 1.16*  CALCIUM 9.1   Liver Function Tests:  Recent Labs Lab 09/12/14 0942  AST 63*  ALT 38  ALKPHOS 64  BILITOT 0.8  PROT 5.9*  ALBUMIN 3.3*    Recent Labs Lab 09/12/14 0942  LIPASE 17*   No results for input(s): AMMONIA in the last 168 hours. CBC:  Recent Labs Lab 09/12/14 0942  WBC 10.4  NEUTROABS 7.1  HGB 13.9  HCT 43.6  MCV 96.9  PLT 111*   Cardiac Enzymes: No results for input(s): CKTOTAL, CKMB, CKMBINDEX, TROPONINI in the last 168 hours.  BNP (last 3 results) No results for input(s): BNP in the last 8760 hours.  ProBNP (last 3 results) No results for input(s): PROBNP in the last 8760 hours.  CBG:  Recent Labs Lab 09/12/14 0913  GLUCAP 172*    Radiological Exams on Admission: Dg Sacrum/coccyx  09/11/2014   CLINICAL DATA:  Status post fall with low back and bilateral knee pain  EXAM: SACRUM AND COCCYX - 2+ VIEW  COMPARISON:  Limited views of the sacrum from a lumbar spine series of April 27, 2010   FINDINGS: There degenerative disc space narrowing at L4-5 and L5-S1. The sacrum is osteopenic. There are at least 3 intact sacral struts observed. The SI joints are unremarkable. The lateral film reveals no evidence of acute fracture or dislocation of the sacral or coccygeal segments. The observed portions of the bony pelvis are intact.  IMPRESSION: There is diffuse osteopenia. There is no acute sacral or coccygeal fracture. There are degenerative changes of the lower lumbar discs.   Electronically Signed   By: David  Martinique M.D.   On: 09/11/2014 14:34   Ct Head Wo Contrast  09/12/2014   CLINICAL DATA:  POSSIBLE FALL THIS AM, PT WAS FOUND ON FLOOR BY CAREGIVER, C/O ALL OVER GENERALIZED PAIN, PT HAD RECENT FALL ON 6.16.2016, PT C/O NAUSEA  EXAM: CT HEAD WITHOUT CONTRAST  CT CERVICAL SPINE WITHOUT CONTRAST  TECHNIQUE: Multidetector CT imaging of the head and cervical spine was performed following the standard protocol without intravenous contrast. Multiplanar CT image reconstructions of the cervical spine were also generated.  COMPARISON:  07/06/2009  FINDINGS: CT HEAD FINDINGS  Ventricles normal configuration. There is ventricular and sulcal enlargement reflecting age related volume loss. No hydrocephalus.  There are no parenchymal masses or mass effect. There is no evidence of a cortical infarct. Mild patchy white matter hypoattenuation is noted consistent with chronic microvascular ischemic change.  There are no extra-axial masses or abnormal fluid collections.  No intracranial hemorrhage.  No skull fracture. Visualized sinuses and mastoid air cells are clear.  CT CERVICAL SPINE FINDINGS  No fracture. Mild reversal of the normal cervical lordosis centered at C5. Grade 1 anterolisthesis of C3 on C4. Moderate loss of disc height at C4-C5 and C5-C6. Facet spurring is noted most prominently at C3-C4 on the left.  Bones are demineralized.  There are hypo attenuating left thyroid nodules similar to the prior CT,  largest measuring 15 mm.  Lung apices are clear.  IMPRESSION: HEAD CT:  No acute intracranial abnormalities.  No skull  fracture.  CERVICAL CT:  No fracture or acute finding.   Electronically Signed   By: Lajean Manes M.D.   On: 09/12/2014 10:41   Ct Cervical Spine Wo Contrast  09/12/2014   CLINICAL DATA:  POSSIBLE FALL THIS AM, PT WAS FOUND ON FLOOR BY CAREGIVER, C/O ALL OVER GENERALIZED PAIN, PT HAD RECENT FALL ON 6.16.2016, PT C/O NAUSEA  EXAM: CT HEAD WITHOUT CONTRAST  CT CERVICAL SPINE WITHOUT CONTRAST  TECHNIQUE: Multidetector CT imaging of the head and cervical spine was performed following the standard protocol without intravenous contrast. Multiplanar CT image reconstructions of the cervical spine were also generated.  COMPARISON:  07/06/2009  FINDINGS: CT HEAD FINDINGS  Ventricles normal configuration. There is ventricular and sulcal enlargement reflecting age related volume loss. No hydrocephalus.  There are no parenchymal masses or mass effect. There is no evidence of a cortical infarct. Mild patchy white matter hypoattenuation is noted consistent with chronic microvascular ischemic change.  There are no extra-axial masses or abnormal fluid collections.  No intracranial hemorrhage.  No skull fracture. Visualized sinuses and mastoid air cells are clear.  CT CERVICAL SPINE FINDINGS  No fracture. Mild reversal of the normal cervical lordosis centered at C5. Grade 1 anterolisthesis of C3 on C4. Moderate loss of disc height at C4-C5 and C5-C6. Facet spurring is noted most prominently at C3-C4 on the left.  Bones are demineralized.  There are hypo attenuating left thyroid nodules similar to the prior CT, largest measuring 15 mm.  Lung apices are clear.  IMPRESSION: HEAD CT:  No acute intracranial abnormalities.  No skull fracture.  CERVICAL CT:  No fracture or acute finding.   Electronically Signed   By: Lajean Manes M.D.   On: 09/12/2014 10:41   Dg Abd Acute W/chest  09/12/2014   CLINICAL DATA:  Fall.   Confusion and vomiting.  EXAM: DG ABDOMEN ACUTE W/ 1V CHEST  COMPARISON:  04/15/2010  FINDINGS: Normal bowel gas pattern. No evidence of obstruction or generalized adynamic ileus. No free air.  No evidence of renal or ureteral stones.  Lungs essentially clear.  Degenerative changes noted throughout visualized spine. Bones are demineralized.  IMPRESSION: No acute findings in the abdomen or pelvis. No evidence of bowel obstruction or free intraperitoneal air.  No acute cardiopulmonary disease.   Electronically Signed   By: Lajean Manes M.D.   On: 09/12/2014 10:33    EKG: Independently reviewed atrial fibrillation  Assessment/Plan Principal Problem:   Acute encephalopathy: Etiology uncertain. CT of head and cervical spine no acute abnormalities. Does not seem to be an infectious process. May be related to worsening respiratory failure versus metabolic versus medication. Will admit to telemetry. Will continue IV fluids as she seems slightly dehydrated. We will cycle troponins. Will repeat EKG in the morning. Will obtain a TSH RPR folate and B-12. She wears oxygen at home at night only and was hypoxic on room air on admission. Chest x-ray without acute abnormality Will obtain ct chest rule out PE. Continue home inhalers.  In addition home medications include Cymbalta, Ativan, Percocet, Lyrica. Minimize these as able.  Active Problems: Syncope/fall: Unwitnessed. Will monitor on telemetry. Check a TSH. Cycle cardiac enzymes. CT of the head without acute abnormalities. Will check orthostatics and obtain a PT consult.    CKD (chronic kidney disease): Appears stable at baseline. Continue home medications    Hyperglycemia; likely related to medications. Will monitor CBGs and use sliding scale insulin as indicated. A1c    Hypoxia; see above.  Will add nebulizers. Continue supplemental oxygen and wean as able   Hypokalemia: Likely related decreased by mouth intake. Will replete and recheck. Will obtain magnesium  level as well      Fibromyalgia: continue home meds as able    COPD (chronic obstructive pulmonary disease); see #4. Will add nebulizers.    GERD (gastroesophageal reflux disease): Stable at baseline.    Chronic respiratory failure; his oxygen at night. See above      if consultant consulted, please document name and whether formally or informally consulted  Code Status: full (must indicate code status--if unknown or must be presumed, indicate so) DVT Prophylaxis: Family Communication: none present Disposition Plan: home when ready may need placement (indicate anticipated LOS)  Time spent: 65 minutes  [facility]

## 2014-09-13 DIAGNOSIS — G9340 Encephalopathy, unspecified: Secondary | ICD-10-CM | POA: Diagnosis not present

## 2014-09-13 DIAGNOSIS — R55 Syncope and collapse: Secondary | ICD-10-CM | POA: Diagnosis not present

## 2014-09-13 LAB — BASIC METABOLIC PANEL
Anion gap: 7 (ref 5–15)
BUN: 13 mg/dL (ref 6–20)
CO2: 28 mmol/L (ref 22–32)
CREATININE: 1 mg/dL (ref 0.44–1.00)
Calcium: 8.8 mg/dL — ABNORMAL LOW (ref 8.9–10.3)
Chloride: 108 mmol/L (ref 101–111)
GFR calc Af Amer: 58 mL/min — ABNORMAL LOW (ref >60)
GFR calc non Af Amer: 50 mL/min — ABNORMAL LOW (ref >60)
Glucose, Bld: 142 mg/dL — ABNORMAL HIGH (ref 65–99)
Potassium: 3.5 mmol/L (ref 3.5–5.1)
Sodium: 143 mmol/L (ref 135–145)

## 2014-09-13 LAB — HEMOGLOBIN A1C
HEMOGLOBIN A1C: 6.7 % — AB (ref 4.8–5.6)
Mean Plasma Glucose: 146 mg/dL

## 2014-09-13 LAB — RPR: RPR Ser Ql: NONREACTIVE

## 2014-09-13 LAB — CBC
HCT: 42.8 % (ref 36.0–46.0)
Hemoglobin: 13.3 g/dL (ref 12.0–15.0)
MCH: 30.2 pg (ref 26.0–34.0)
MCHC: 31.1 g/dL (ref 30.0–36.0)
MCV: 97.1 fL (ref 78.0–100.0)
Platelets: 111 10*3/uL — ABNORMAL LOW (ref 150–400)
RBC: 4.41 MIL/uL (ref 3.87–5.11)
RDW: 15.8 % — ABNORMAL HIGH (ref 11.5–15.5)
WBC: 5.1 10*3/uL (ref 4.0–10.5)

## 2014-09-13 LAB — GLUCOSE, CAPILLARY
Glucose-Capillary: 127 mg/dL — ABNORMAL HIGH (ref 65–99)
Glucose-Capillary: 207 mg/dL — ABNORMAL HIGH (ref 65–99)

## 2014-09-13 LAB — HIV ANTIBODY (ROUTINE TESTING W REFLEX): HIV SCREEN 4TH GENERATION: NONREACTIVE

## 2014-09-13 LAB — TROPONIN I: Troponin I: <0.03 ng/mL (ref <0.031)

## 2014-09-13 NOTE — Discharge Summary (Signed)
Physician Discharge Summary  [name] [id number] DOB: [birth year] DOA: 09/12/2014  PCP: Delphina Cahill, MD  Admit date: 09/12/2014 Discharge date: 09/13/2014  Time spent: 45 minutes  Recommendations for Outpatient Follow-up:  -Will be discharged home today. -Advised to follow-up with primary care provider in 2 weeks.   Discharge Diagnoses:  Principal Problem:   Acute encephalopathy Active Problems:   Fibromyalgia   COPD (chronic obstructive pulmonary disease)   GERD (gastroesophageal reflux disease)   Chronic respiratory failure   Hypokalemia   Syncope   CKD (chronic kidney disease)   Hyperglycemia   Hypoxia   Discharge Condition: Stable and improved  Filed Weights   09/12/14 1606 09/13/14 0600 09/13/14 0649  Weight: 97.569 kg (215 lb 1.6 oz) 97.1 kg (214 lb 1.1 oz) 97.3 kg (214 lb 8.1 oz)    History of present illness:  [name] is a 81 y.o. female with a past medical history that includes chronic respiratory failure related to COPD on home O2, chronic pain, GERD, A. fib on elaquis presents to the emergency department chief complaint of fall/syncope. Information obtained from the daughter who reports caregiver arrived at patient's house and palpation on the bathroom floor. Patient was weak confused complained of generalized pain and had vomited. Daughter reports she saw the patient last night patient was in her usual state of health no complaints of nausea vomiting worsening discomfort. Of note patient was seen in the emergency department yesterday after a fall that occurred 2 weeks ago. Patient complained of low back pain these x-rays were negative. No report of fever chills worsening cough difficulty swallowing. Workup in the emergency department significant for platelets of 111 potassium 3.2 chloride 100 creatinine 1.16 serum glucose 179. KG with A. fib at a rate of 109 She has low-grade temperature 99.4 she's hemodynamically stable mildly tachycardic with  hypoxia on room air  Hospital Course:   Syncope/frequent falls -TSH/vitamin B-12 within normal limits. -Orthostatic vital signs within normal limits. -Has ruled out for acute coronary syndrome with negative troponins and EKG without acute ischemic abnormality. -No arrhythmias noted on telemetry. -CT scan of the head without acute intracranial abnormalities, no focal neurologic deficits to make me suspect CVA. -See no reason to perform 2-D echo and carotid Dopplers at this time. -Because of mild hypoxemia and mild tachycardia decision was made to rule out PE with CT angiogram of the chest which was negative. -Physical therapy has assessed patient does not believe that further physical therapy would be beneficial. -Suspect polypharmacy may be playing a role and recommend close discussion in near future with PCP regarding pain medication and anxiety medications.  Rest of chronic medical conditions are stable and home medications have not changed  Procedures:  None   Consultations:  None  Discharge Instructions  Discharge Instructions    Diet - low sodium heart healthy    Complete by:  As directed      Increase activity slowly    Complete by:  As directed             Medication List    TAKE these medications        ADVAIR DISKUS 250-50 MCG/DOSE Aepb  Generic drug:  Fluticasone-Salmeterol  Inhale 1 puff into the lungs every 12 (twelve) hours.     cyanocobalamin 100 MCG tablet  Take 100 mcg by mouth daily.     diltiazem 300 MG 24 hr capsule  Commonly known as:  CARDIZEM CD  Take 1 capsule (300  mg total) by mouth daily.     DULoxetine 60 MG capsule  Commonly known as:  CYMBALTA  Take 60 mg by mouth daily.     ELIQUIS 2.5 MG Tabs tablet  Generic drug:  apixaban  Take 2.5 mg by mouth 2 (two) times daily.     ferrous gluconate 325 MG tablet  Commonly known as:  FERGON  Take 325 mg by mouth daily.     folic acid 1 MG tablet  Commonly known as:  FOLVITE  Take 1 mg  by mouth daily.     guaiFENesin-dextromethorphan 100-10 MG/5ML syrup  Commonly known as:  ROBITUSSIN DM  Take 5 mLs by mouth every 6 (six) hours.     LORazepam 0.5 MG tablet  Commonly known as:  ATIVAN  Take 1 tablet (0.5 mg total) by mouth every 6 (six) hours as needed for anxiety.     methotrexate 2.5 MG tablet  Commonly known as:  RHEUMATREX  Take 12.5 mg by mouth once a week. *Taken on Thursdays at 1200Caution:Chemotherapy. Protect from light.     oxyCODONE-acetaminophen 7.5-325 MG per tablet  Commonly known as:  PERCOCET  Take 1 tablet by mouth 4 (four) times daily as needed.     pantoprazole 40 MG tablet  Commonly known as:  PROTONIX  Take 40 mg by mouth 2 (two) times daily.     polyethylene glycol packet  Commonly known as:  MIRALAX / GLYCOLAX  Take 17 g by mouth daily as needed for mild constipation or moderate constipation.     predniSONE 5 MG tablet  Commonly known as:  DELTASONE  Take 5 mg by mouth daily.     pregabalin 50 MG capsule  Commonly known as:  LYRICA  Take 50 mg by mouth 2 (two) times daily.     PROAIR HFA 108 (90 BASE) MCG/ACT inhaler  Generic drug:  albuterol  Inhale 2 puffs into the lungs every 6 (six) hours as needed. For rescue     PROBIOTIC FORMULA PO  Take 1 tablet by mouth daily.     raloxifene 60 MG tablet  Commonly known as:  EVISTA  Take 60 mg by mouth daily.     rosuvastatin 20 MG tablet  Commonly known as:  CRESTOR  Take 20 mg by mouth daily.     vitamin C 500 MG tablet  Commonly known as:  ASCORBIC ACID  Take 500 mg by mouth daily.     Vitamin D3 1000 UNITS Caps  Take 1 capsule by mouth daily.       Allergies  Allergen Reactions  . Other     Band Aids   . Tape   . Lipitor [Atorvastatin Calcium] Rash and Other (See Comments)    Muscle pain  . Niaspan [Niacin Er] Rash and Other (See Comments)    Muscle pain       Follow-up Information    Follow up with Delphina Cahill, MD. Schedule an appointment as soon as possible  for a visit in 1 week.   Specialty:  Internal Medicine   Contact information:    [place] [address] [phone]        The results of significant diagnostics from this hospitalization (including imaging, microbiology, ancillary and laboratory) are listed below for reference.    Significant Diagnostic Studies: Dg Sacrum/coccyx  09/11/2014   CLINICAL DATA:  Status post fall with low back and bilateral knee pain  EXAM: SACRUM AND COCCYX - 2+ VIEW  COMPARISON:  Limited views of the sacrum from a lumbar spine series of April 27, 2010  FINDINGS: There degenerative disc space narrowing at L4-5 and L5-S1. The sacrum is osteopenic. There are at least 3 intact sacral struts observed. The SI joints are unremarkable. The lateral film reveals no evidence of acute fracture or dislocation of the sacral or coccygeal segments. The observed portions of the bony pelvis are intact.  IMPRESSION: There is diffuse osteopenia. There is no acute sacral or coccygeal fracture. There are degenerative changes of the lower lumbar discs.   Electronically Signed   By: David  Martinique M.D.   On: 09/11/2014 14:34   Ct Head Wo Contrast  09/12/2014   CLINICAL DATA:  POSSIBLE FALL THIS AM, PT WAS FOUND ON FLOOR BY CAREGIVER, C/O ALL OVER GENERALIZED PAIN, PT HAD RECENT FALL ON 6.16.2016, PT C/O NAUSEA  EXAM: CT HEAD WITHOUT CONTRAST  CT CERVICAL SPINE WITHOUT CONTRAST  TECHNIQUE: Multidetector CT imaging of the head and cervical spine was performed following the standard protocol without intravenous contrast. Multiplanar CT image reconstructions of the cervical spine were also generated.  COMPARISON:  07/06/2009  FINDINGS: CT HEAD FINDINGS  Ventricles normal configuration. There is ventricular and sulcal enlargement reflecting age related volume loss. No hydrocephalus.  There are no parenchymal masses or mass effect. There is no evidence of a cortical infarct. Mild patchy white matter hypoattenuation is noted  consistent with chronic microvascular ischemic change.  There are no extra-axial masses or abnormal fluid collections.  No intracranial hemorrhage.  No skull fracture. Visualized sinuses and mastoid air cells are clear.  CT CERVICAL SPINE FINDINGS  No fracture. Mild reversal of the normal cervical lordosis centered at C5. Grade 1 anterolisthesis of C3 on C4. Moderate loss of disc height at C4-C5 and C5-C6. Facet spurring is noted most prominently at C3-C4 on the left.  Bones are demineralized.  There are hypo attenuating left thyroid nodules similar to the prior CT, largest measuring 15 mm.  Lung apices are clear.  IMPRESSION: HEAD CT:  No acute intracranial abnormalities.  No skull fracture.  CERVICAL CT:  No fracture or acute finding.   Electronically Signed   By: Lajean Manes M.D.   On: 09/12/2014 10:41   Ct Angio Chest Pe W/cm &/or Wo Cm  09/12/2014   CLINICAL DATA:  Chronic respiratory failure. Home oxygen. Fall this a.m. Concern for pulmonary embolism.  EXAM: CT ANGIOGRAPHY CHEST WITH CONTRAST  TECHNIQUE: Multidetector CT imaging of the chest was performed using the standard protocol during bolus administration of intravenous contrast. Multiplanar CT image reconstructions and MIPs were obtained to evaluate the vascular anatomy.  CONTRAST:  171mL OMNIPAQUE IOHEXOL 350 MG/ML SOLN  COMPARISON:  CT thorax 07/21/2013  FINDINGS: Mediastinum/Nodes: No filling defects the pulmonary arteries to suggest acute pulmonary embolism. No acute findings of the aorta or great vessels. There is calcification of the thoracic aorta. Pericardial fluid.  No mediastinal adenopathy.  Esophagus normal supraclavicular  Lungs/Pleura: Centrilobular emphysema with an upper lobe predominance. Small focus of atelectasis in the right lung base of the diaphragm. No pneumothorax. No pulmonary edema.  Upper abdomen: Serve limited abdomen  Musculoskeletal: Multiples levels of endplate spurring. No acute osseous findings.  Review of the MIP  images confirms the above findings.  IMPRESSION: 1. No evidence of acute pulmonary embolism. 2. Atherosclerotic calcification aorta. 3. Centrilobular emphysema.  No acute cardiopulmonary findings. 4. Mild atelectasis at the right lung base.   Electronically Signed   By: Suzy Bouchard  M.D.   On: 09/12/2014 17:06   Ct Cervical Spine Wo Contrast  09/12/2014   CLINICAL DATA:  POSSIBLE FALL THIS AM, PT WAS FOUND ON FLOOR BY CAREGIVER, C/O ALL OVER GENERALIZED PAIN, PT HAD RECENT FALL ON 6.16.2016, PT C/O NAUSEA  EXAM: CT HEAD WITHOUT CONTRAST  CT CERVICAL SPINE WITHOUT CONTRAST  TECHNIQUE: Multidetector CT imaging of the head and cervical spine was performed following the standard protocol without intravenous contrast. Multiplanar CT image reconstructions of the cervical spine were also generated.  COMPARISON:  07/06/2009  FINDINGS: CT HEAD FINDINGS  Ventricles normal configuration. There is ventricular and sulcal enlargement reflecting age related volume loss. No hydrocephalus.  There are no parenchymal masses or mass effect. There is no evidence of a cortical infarct. Mild patchy white matter hypoattenuation is noted consistent with chronic microvascular ischemic change.  There are no extra-axial masses or abnormal fluid collections.  No intracranial hemorrhage.  No skull fracture. Visualized sinuses and mastoid air cells are clear.  CT CERVICAL SPINE FINDINGS  No fracture. Mild reversal of the normal cervical lordosis centered at C5. Grade 1 anterolisthesis of C3 on C4. Moderate loss of disc height at C4-C5 and C5-C6. Facet spurring is noted most prominently at C3-C4 on the left.  Bones are demineralized.  There are hypo attenuating left thyroid nodules similar to the prior CT, largest measuring 15 mm.  Lung apices are clear.  IMPRESSION: HEAD CT:  No acute intracranial abnormalities.  No skull fracture.  CERVICAL CT:  No fracture or acute finding.   Electronically Signed   By: Lajean Manes M.D.   On: 09/12/2014  10:41   Dg Abd Acute W/chest  09/12/2014   CLINICAL DATA:  Fall.  Confusion and vomiting.  EXAM: DG ABDOMEN ACUTE W/ 1V CHEST  COMPARISON:  04/15/2010  FINDINGS: Normal bowel gas pattern. No evidence of obstruction or generalized adynamic ileus. No free air.  No evidence of renal or ureteral stones.  Lungs essentially clear.  Degenerative changes noted throughout visualized spine. Bones are demineralized.  IMPRESSION: No acute findings in the abdomen or pelvis. No evidence of bowel obstruction or free intraperitoneal air.  No acute cardiopulmonary disease.   Electronically Signed   By: Lajean Manes M.D.   On: 09/12/2014 10:33    Microbiology: No results found for this or any previous visit (from the past 240 hour(s)).   Labs: Basic Metabolic Panel:  Recent Labs Lab 09/12/14 0942 09/12/14 1526 09/13/14 0644  NA 140  --  143  K 3.2*  --  3.5  CL 100*  --  108  CO2 29  --  28  GLUCOSE 179*  --  142*  BUN 16  --  13  CREATININE 1.16* 1.08* 1.00  CALCIUM 9.1  --  8.8*  MG  --  1.8  --    Liver Function Tests:  Recent Labs Lab 09/12/14 0942  AST 63*  ALT 38  ALKPHOS 64  BILITOT 0.8  PROT 5.9*  ALBUMIN 3.3*    Recent Labs Lab 09/12/14 0942  LIPASE 17*   No results for input(s): AMMONIA in the last 168 hours. CBC:  Recent Labs Lab 09/12/14 0942 09/12/14 1526 09/13/14 0644  WBC 10.4 9.1 5.1  NEUTROABS 7.1  --   --   HGB 13.9 13.2 13.3  HCT 43.6 41.9 42.8  MCV 96.9 96.3 97.1  PLT 111* 107* 111*   Cardiac Enzymes:  Recent Labs Lab 09/12/14 1526 09/12/14 2130 09/13/14 0644  TROPONINI <0.03 <  0.03 <0.03   BNP: BNP (last 3 results) No results for input(s): BNP in the last 8760 hours.  ProBNP (last 3 results) No results for input(s): PROBNP in the last 8760 hours.  CBG:  Recent Labs Lab 09/12/14 0913 09/12/14 1710 09/12/14 2203 09/13/14 0803 09/13/14 1204  GLUCAP 172* 167* 189* 127* 207*       Signed:  HERNANDEZ ACOSTA,ESTELA  [facility] Pager: [phone] 09/13/2014, 2:16 PM

## 2014-09-13 NOTE — Discharge Instructions (Signed)

## 2014-09-13 NOTE — Progress Notes (Signed)
NURSING PROGRESS NOTE  MIRLANDE DESCHAINE [id number] Discharge Data: 09/13/2014 3:56 PM Attending Provider: No att. providers found [id number], [facility], MD   [name] to be D/C'd Home per MD order.    All IV's discontinued and monitored for bleeding.  All belongings returned to patient for patient to take home.  Cardiac monitor discontinued.   AVS Summary reviewed with patient and her family.  Patient left floor via wheelchair, escorted by NT.  Last Documented Vital Signs:  Blood pressure 120/86, pulse 76, temperature 98 F (36.7 C), temperature source Oral, resp. rate 18, height 5\' 3"  (1.6 m), weight 97.3 kg (214 lb 8.1 oz), SpO2 94 %.  Cecilie Kicks D

## 2014-09-17 LAB — FOLATE RBC
Folate, RBC: >1594 ng/mL (ref >498)
HEMATOCRIT: 38.9 % (ref 34.0–46.6)

## 2014-09-25 ENCOUNTER — Other Ambulatory Visit: Payer: Self-pay | Admitting: Cardiology

## 2014-10-11 ENCOUNTER — Emergency Department (HOSPITAL_COMMUNITY)
Admission: EM | Admit: 2014-10-11 | Discharge: 2014-10-11 | Disposition: A | Discharge status: Home / Self Care | Payer: Medicare Other | Attending: Emergency Medicine | Admitting: Emergency Medicine

## 2014-10-11 ENCOUNTER — Encounter (HOSPITAL_COMMUNITY): Payer: Self-pay | Admitting: *Deleted

## 2014-10-11 ENCOUNTER — Emergency Department (HOSPITAL_COMMUNITY): Payer: Medicare Other

## 2014-10-11 DIAGNOSIS — K219 Gastro-esophageal reflux disease without esophagitis: Secondary | ICD-10-CM | POA: Insufficient documentation

## 2014-10-11 DIAGNOSIS — Z8709 Personal history of other diseases of the respiratory system: Secondary | ICD-10-CM | POA: Insufficient documentation

## 2014-10-11 DIAGNOSIS — D649 Anemia, unspecified: Secondary | ICD-10-CM | POA: Insufficient documentation

## 2014-10-11 DIAGNOSIS — F329 Major depressive disorder, single episode, unspecified: Secondary | ICD-10-CM | POA: Diagnosis not present

## 2014-10-11 DIAGNOSIS — Z8719 Personal history of other diseases of the digestive system: Secondary | ICD-10-CM | POA: Diagnosis not present

## 2014-10-11 DIAGNOSIS — F411 Generalized anxiety disorder: Secondary | ICD-10-CM | POA: Insufficient documentation

## 2014-10-11 DIAGNOSIS — G8929 Other chronic pain: Secondary | ICD-10-CM | POA: Insufficient documentation

## 2014-10-11 DIAGNOSIS — I499 Cardiac arrhythmia, unspecified: Secondary | ICD-10-CM | POA: Diagnosis not present

## 2014-10-11 DIAGNOSIS — Z85828 Personal history of other malignant neoplasm of skin: Secondary | ICD-10-CM | POA: Diagnosis not present

## 2014-10-11 DIAGNOSIS — M75101 Unspecified rotator cuff tear or rupture of right shoulder, not specified as traumatic: Secondary | ICD-10-CM | POA: Insufficient documentation

## 2014-10-11 DIAGNOSIS — E559 Vitamin D deficiency, unspecified: Secondary | ICD-10-CM | POA: Diagnosis not present

## 2014-10-11 DIAGNOSIS — R079 Chest pain, unspecified: Secondary | ICD-10-CM | POA: Insufficient documentation

## 2014-10-11 DIAGNOSIS — J449 Chronic obstructive pulmonary disease, unspecified: Secondary | ICD-10-CM | POA: Diagnosis not present

## 2014-10-11 DIAGNOSIS — R61 Generalized hyperhidrosis: Secondary | ICD-10-CM | POA: Diagnosis not present

## 2014-10-11 DIAGNOSIS — G608 Other hereditary and idiopathic neuropathies: Secondary | ICD-10-CM | POA: Insufficient documentation

## 2014-10-11 DIAGNOSIS — M7581 Other shoulder lesions, right shoulder: Secondary | ICD-10-CM

## 2014-10-11 DIAGNOSIS — Z7902 Long term (current) use of antithrombotics/antiplatelets: Secondary | ICD-10-CM | POA: Diagnosis not present

## 2014-10-11 DIAGNOSIS — E780 Pure hypercholesterolemia: Secondary | ICD-10-CM | POA: Diagnosis not present

## 2014-10-11 DIAGNOSIS — Z79899 Other long term (current) drug therapy: Secondary | ICD-10-CM | POA: Diagnosis not present

## 2014-10-11 DIAGNOSIS — Z9981 Dependence on supplemental oxygen: Secondary | ICD-10-CM | POA: Insufficient documentation

## 2014-10-11 DIAGNOSIS — M25511 Pain in right shoulder: Secondary | ICD-10-CM

## 2014-10-11 DIAGNOSIS — I10 Essential (primary) hypertension: Secondary | ICD-10-CM | POA: Insufficient documentation

## 2014-10-11 LAB — CBC
HEMATOCRIT: 46 % (ref 36.0–46.0)
Hemoglobin: 15.1 g/dL — ABNORMAL HIGH (ref 12.0–15.0)
MCH: 31.1 pg (ref 26.0–34.0)
MCHC: 32.8 g/dL (ref 30.0–36.0)
MCV: 94.7 fL (ref 78.0–100.0)
Platelets: 156 10*3/uL (ref 150–400)
RBC: 4.86 MIL/uL (ref 3.87–5.11)
RDW: 15.4 % (ref 11.5–15.5)
WBC: 10.8 10*3/uL — AB (ref 4.0–10.5)

## 2014-10-11 LAB — BASIC METABOLIC PANEL
ANION GAP: 10 (ref 5–15)
BUN: 20 mg/dL (ref 6–20)
CHLORIDE: 98 mmol/L — AB (ref 101–111)
CO2: 30 mmol/L (ref 22–32)
Calcium: 9.6 mg/dL (ref 8.9–10.3)
Creatinine, Ser: 1.21 mg/dL — ABNORMAL HIGH (ref 0.44–1.00)
GFR, EST AFRICAN AMERICAN: 46 mL/min — AB (ref >60)
GFR, EST NON AFRICAN AMERICAN: 40 mL/min — AB (ref >60)
Glucose, Bld: 178 mg/dL — ABNORMAL HIGH (ref 65–99)
POTASSIUM: 3.2 mmol/L — AB (ref 3.5–5.1)
SODIUM: 138 mmol/L (ref 135–145)

## 2014-10-11 LAB — URINALYSIS, ROUTINE W REFLEX MICROSCOPIC
BILIRUBIN URINE: NEGATIVE
Glucose, UA: NEGATIVE mg/dL
Hgb urine dipstick: NEGATIVE
Leukocytes, UA: NEGATIVE
NITRITE: NEGATIVE
PH: 5.5 (ref 5.0–8.0)
PROTEIN: NEGATIVE mg/dL
Specific Gravity, Urine: >1.03 — ABNORMAL HIGH (ref 1.005–1.030)
Urobilinogen, UA: 1 mg/dL (ref 0.0–1.0)

## 2014-10-11 LAB — PROTIME-INR
INR: 1.28 (ref 0.00–1.49)
Prothrombin Time: 16.2 seconds — ABNORMAL HIGH (ref 11.6–15.2)

## 2014-10-11 LAB — TROPONIN I

## 2014-10-11 MED ORDER — OXYCODONE-ACETAMINOPHEN 5-325 MG PO TABS
1.0000 | ORAL_TABLET | Freq: Once | ORAL | Status: AC
Start: 2014-10-11 — End: 2014-10-11
  Administered 2014-10-11: 1 via ORAL
  Filled 2014-10-11: qty 1

## 2014-10-11 MED ORDER — MORPHINE SULFATE 2 MG/ML IJ SOLN: 2.0000 mg | INTRAMUSCULAR | Status: DC | PRN

## 2014-10-11 MED ORDER — ONDANSETRON HCL 4 MG/2ML IJ SOLN
4.0000 mg | Freq: Once | INTRAMUSCULAR | Status: AC
  Administered 2014-10-11: 4 mg via INTRAVENOUS
  Filled 2014-10-11: qty 2

## 2014-10-11 MED ORDER — DICLOFENAC EPOLAMINE 1.3 % TD PTCH: 1.0000 | MEDICATED_PATCH | Freq: Two times a day (BID) | TRANSDERMAL | Status: DC

## 2014-10-11 MED ORDER — LIDOCAINE 5 % EX PTCH: 1.0000 | MEDICATED_PATCH | CUTANEOUS | Status: DC

## 2014-10-11 NOTE — ED Notes (Signed)
Pt with right shoulder pain that started last night, pt denies any injury, reported that pt lives at home alone, daughter brought pt in for evaluation

## 2014-10-11 NOTE — Discharge Instructions (Signed)
Lidocaine patch to 3 hours prior to bedtime. Remove in the morning. If not getting symptomatic relief with lidocaine after several days, she may fill and try diclofenac patch.  Arthralgia Arthralgia is joint pain. A joint is a place where two bones meet. Joint pain can happen for many reasons. The joint can be bruised, stiff, infected, or weak from aging. Pain usually goes away after resting and taking medicine for soreness.  HOME CARE  Rest the joint as told by your doctor.  Keep the sore joint raised (elevated) for the first 24 hours.  Put ice on the joint area.  Put ice in a plastic bag.  Place a towel between your skin and the bag.  Leave the ice on for 15-20 minutes, 03-04 times a day.  Wear your splint, casting, elastic bandage, or sling as told by your doctor.  Only take medicine as told by your doctor. Do not take aspirin.  Use crutches as told by your doctor. Do not put weight on the joint until told to by your doctor. GET HELP RIGHT AWAY IF:   You have bruising, puffiness (swelling), or more pain.  Your fingers or toes turn blue or start to lose feeling (numb).  Your medicine does not lessen the pain.  Your pain becomes severe.  You have a temperature by mouth above 102 F (38.9 C), not controlled by medicine.  You cannot move or use the joint. MAKE SURE YOU:   Understand these instructions.  Will watch your condition.  Will get help right away if you are not doing well or get worse. Document Released: 02/16/2009 Document Revised: 05/23/2011 Document Reviewed: 02/16/2009 [facility] Patient Information 2015 [place], [place]. This information is not intended to replace advice given to you by your health care provider. Make sure you discuss any questions you have with your health care provider.  Rotator Cuff Tendinitis  Rotator cuff tendinitis is inflammation of the tough, cord-like bands that connect muscle to bone (tendons) in your rotator cuff. Your rotator  cuff is the collection of all the muscles and tendons that connect your arm to your shoulder. Your rotator cuff holds the head of your upper arm bone (humerus) in the cup (fossa) of your shoulder blade (scapula). CAUSES Rotator cuff tendinitis is usually caused by overusing the joint involved.  SIGNS AND SYMPTOMS  Deep ache in the shoulder also felt on the outside upper arm over the shoulder muscle.  Point tenderness over the area that is injured.  Pain comes on gradually and becomes worse with lifting the arm to the side (abduction) or turning it inward (internal rotation).  May lead to a chronic tear: When a rotator cuff tendon becomes inflamed, it runs the risk of losing its blood supply, causing some tendon fibers to die. This increases the risk that the tendon can fray and partially or completely tear. DIAGNOSIS Rotator cuff tendinitis is diagnosed by taking a medical history, performing a physical exam, and reviewing results of imaging exams. The medical history is useful to help determine the type of rotator cuff injury. The physical exam will include looking at the injured shoulder, feeling the injured area, and watching you do range-of-motion exercises. X-ray exams are typically done to rule out other causes of shoulder pain, such as fractures. MRI is the imaging exam usually used for significant shoulder injuries. Sometimes a dye study called CT arthrogram is done, but it is not as widely used as MRI. In some institutions, special ultrasound tests may also be  used to aid in the diagnosis. TREATMENT  Less Severe Cases  Use of a sling to rest the shoulder for a short period of time. Prolonged use of the sling can cause stiffness, weakness, and loss of motion of the shoulder joint.  Anti-inflammatory medicines, such as ibuprofen or naproxen sodium, may be prescribed. More Severe Cases  Physical therapy.  Use of steroid injections into the shoulder joint.  Surgery. HOME CARE  INSTRUCTIONS   Use a sling or splint until the pain decreases. Prolonged use of the sling can cause stiffness, weakness, and loss of motion of the shoulder joint.  Apply ice to the injured area:  Put ice in a plastic bag.  Place a towel between your skin and the bag.  Leave the ice on for 20 minutes, 2-3 times a day.  Try to avoid use other than gentle range of motion while your shoulder is painful. Use the shoulder and exercise only as directed by your health care provider. Stop exercises or range of motion if pain or discomfort increases, unless directed otherwise by your health care provider.  Only take over-the-counter or prescription medicines for pain, discomfort, or fever as directed by your health care provider.  If you were given a shoulder sling and straps (immobilizer), do not remove it except as directed, or until you see a health care provider for a follow-up exam. If you need to remove it, move your arm as little as possible or as directed.  You may want to sleep on several pillows at night to lessen swelling and pain. SEEK IMMEDIATE MEDICAL CARE IF:   Your shoulder pain increases or new pain develops in your arm, hand, or fingers and is not relieved with medicines.  You have new, unexplained symptoms, especially increased numbness in the hands or loss of strength.  You develop any worsening of the problems that brought you in for care.  Your arm, hand, or fingers are numb or tingling.  Your arm, hand, or fingers are swollen, painful, or turn white or blue. MAKE SURE YOU:  Understand these instructions.  Will watch your condition.  Will get help right away if you are not doing well or get worse. Document Released: 05/21/2003 Document Revised: 12/19/2012 Document Reviewed: 10/10/2012 [facility] Patient Information 2015 [place], [place]. This information is not intended to replace advice given to you by your health care provider. Make sure you discuss any questions you  have with your health care provider.

## 2014-10-11 NOTE — ED Provider Notes (Signed)
CSN: [id number]     Arrival date & time 10/11/14  1340 History   First MD Initiated Contact with Patient 10/11/14 1407     Chief Complaint  Patient presents with  . Shoulder Pain      HPI  Patient [place] from [place] country by her daughter. Patient lives independently. That has home health care nursing each morning and family to stay with her in the afternoon. No fall or injury. However is complaining of right shoulder pain for the last 2 days per primarily at night.  Daughter states that her mother has complained of intermittent chest pain and sweats and chills for the last several years. This is evidenced in her most recent cardiology visit.  She has chronic rate-controlled atrial fibrillation.   Past Medical History  Diagnosis Date  . Anemia   . Back pain   . COPD (chronic obstructive pulmonary disease)   . Depression   . Fibromyalgia   . Hypercholesteremia   . Hypertension   . Insomnia   . Rheumatoid arteritis   . Cancer     Skin   . Vitamin D deficiency   . 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 Generalized anxiety disorder   . GERD (gastroesophageal reflux disease)   . Bacterial pneumonia   . Fatty liver   . Internal hemorrhoids   . Diverticulosis   . Ulcer     stomach  . Gastric polyp 09/08/11    at the anastomosis-inflammatory  . Atrial fibrillation   . Chronic respiratory failure   . On home O2     qhs   Past Surgical History  Procedure Laterality Date  . Abdominal surgery      removed patial stomach from ulcer  . Abdominal hysterectomy    . Colonoscopy  04-2001    internal hemorrhoids, panocolonic diverticulosis, and colon polyps   . Upper gastrointestinal endoscopy     Family History  Problem Relation Age of Onset  . Colon cancer Daughter 41  . Heart disease Father   . Kidney disease Mother     kidney cancer   History  Substance Use Topics  . Smoking status: Never Smoker   . Smokeless tobacco: Never Used  . Alcohol Use: No   OB  History    No data available     Review of Systems  Constitutional: Positive for diaphoresis. Negative for fever, chills, appetite change and fatigue.  HENT: Negative for mouth sores, sore throat and trouble swallowing.   Eyes: Negative for visual disturbance.  Respiratory: Negative for cough, chest tightness, shortness of breath and wheezing.   Cardiovascular: Positive for chest pain.  Gastrointestinal: Negative for nausea, vomiting, abdominal pain, diarrhea and abdominal distention.  Endocrine: Negative for polydipsia, polyphagia and polyuria.  Genitourinary: Negative for dysuria, frequency and hematuria.  Musculoskeletal: Positive for arthralgias. Negative for gait problem.  Skin: Negative for color change, pallor and rash.  Neurological: Negative for dizziness, syncope, light-headedness and headaches.  Hematological: Does not bruise/bleed easily.  Psychiatric/Behavioral: Negative for behavioral problems and confusion.      Allergies  Other; Tape; Lipitor; and Niaspan  Home Medications   Prior to Admission medications   Medication Sig Start Date End Date Taking? Authorizing Provider  albuterol (PROAIR HFA) 108 (90 BASE) MCG/ACT inhaler Inhale 2 puffs into the lungs every 6 (six) hours as needed. For rescue    Historical Provider, MD  apixaban (ELIQUIS) 2.5 MG TABS tablet Take 2.5 mg by mouth 2 (two) times daily.  Historical Provider, MD  CARTIA XT 300 MG 24 hr capsule TAKE ONE CAPSULE BY MOUTH EVERY DAY 09/25/14   Arnoldo Lenis, MD  Cholecalciferol (VITAMIN D3) 1000 UNITS CAPS Take 1 capsule by mouth daily.    Historical Provider, MD  cyanocobalamin 100 MCG tablet Take 100 mcg by mouth daily.    Historical Provider, MD  DULoxetine (CYMBALTA) 60 MG capsule Take 60 mg by mouth daily.      Historical Provider, MD  ferrous gluconate (FERGON) 325 MG tablet Take 325 mg by mouth daily.     Historical Provider, MD  Fluticasone-Salmeterol (ADVAIR DISKUS) 250-50 MCG/DOSE AEPB Inhale  1 puff into the lungs every 12 (twelve) hours.    Historical Provider, MD  folic acid (FOLVITE) 1 MG tablet Take 1 mg by mouth daily.    Historical Provider, MD  guaiFENesin-dextromethorphan (ROBITUSSIN DM) 100-10 MG/5ML syrup Take 5 mLs by mouth every 6 (six) hours. Patient not taking: Reported on 09/12/2014 07/29/13   Geradine Girt, DO  LORazepam (ATIVAN) 0.5 MG tablet Take 1 tablet (0.5 mg total) by mouth every 6 (six) hours as needed for anxiety. 07/29/13   Geradine Girt, DO  methotrexate (RHEUMATREX) 2.5 MG tablet Take 12.5 mg by mouth once a week. *Taken on Thursdays at 1200Caution:Chemotherapy. Protect from light.    Historical Provider, MD  oxyCODONE-acetaminophen (PERCOCET) 7.5-325 MG per tablet Take 1 tablet by mouth 4 (four) times daily as needed. 07/29/13   Geradine Girt, DO  pantoprazole (PROTONIX) 40 MG tablet Take 40 mg by mouth 2 (two) times daily.     Historical Provider, MD  polyethylene glycol (MIRALAX / GLYCOLAX) packet Take 17 g by mouth daily as needed for mild constipation or moderate constipation.     Historical Provider, MD  predniSONE (DELTASONE) 5 MG tablet Take 5 mg by mouth daily.      Historical Provider, MD  pregabalin (LYRICA) 50 MG capsule Take 50 mg by mouth 2 (two) times daily.     Historical Provider, MD  Probiotic Product (PROBIOTIC FORMULA PO) Take 1 tablet by mouth daily.     Historical Provider, MD  raloxifene (EVISTA) 60 MG tablet Take 60 mg by mouth daily.    Historical Provider, MD  rosuvastatin (CRESTOR) 20 MG tablet Take 20 mg by mouth daily.     Historical Provider, MD  vitamin C (ASCORBIC ACID) 500 MG tablet Take 500 mg by mouth daily.    Historical Provider, MD   BP 138/81 mmHg  Pulse 98  Temp(Src) 97.9 F (36.6 C) (Oral)  Resp 20  Ht 5\' 6"  (1.676 m)  Wt 215 lb (97.523 kg)  BMI 34.72 kg/m2  SpO2 94% Physical Exam  Constitutional: She is oriented to person, place, and time. She appears well-developed and well-nourished. No distress.  HENT:    Head: Normocephalic.  Eyes: Conjunctivae are normal. Pupils are equal, round, and reactive to light. No scleral icterus.  Neck: Normal range of motion. Neck supple. No thyromegaly present.  Cardiovascular: Normal rate.  An irregularly irregular rhythm present. Exam reveals no gallop and no friction rub.   No murmur heard. Pulmonary/Chest: Effort normal and breath sounds normal. No respiratory distress. She has no wheezes. She has no rales.  Abdominal: Soft. Bowel sounds are normal. She exhibits no distension. There is no tenderness. There is no rebound.  Musculoskeletal: Normal range of motion.       Arms: Tenderness along the supraspinatus fossa to the tip of the acromion. Pain with biceps  flexion, or any external rotation.  Neurological: She is alert and oriented to person, place, and time.  Skin: Skin is warm and dry. No rash noted.  Psychiatric: She has a normal mood and affect. Her behavior is normal.    ED Course  Procedures (including critical care time) Labs Review Labs Reviewed  CBC - Abnormal; Notable for the following:    WBC 10.8 (*)    Hemoglobin 15.1 (*)    All other components within normal limits  BASIC METABOLIC PANEL - Abnormal; Notable for the following:    Potassium 3.2 (*)    Chloride 98 (*)    Glucose, Bld 178 (*)    Creatinine, Ser 1.21 (*)    GFR calc non Af Amer 40 (*)    GFR calc Af Amer 46 (*)    All other components within normal limits  PROTIME-INR - Abnormal; Notable for the following:    Prothrombin Time 16.2 (*)    All other components within normal limits  URINALYSIS, ROUTINE W REFLEX MICROSCOPIC (NOT AT [facility]) - Abnormal; Notable for the following:    Color, Urine AMBER (*)    APPearance CLOUDY (*)    Specific Gravity, Urine >1.030 (*)    Ketones, ur TRACE (*)    All other components within normal limits  TROPONIN I    Imaging Review Dg Shoulder Right  10/11/2014   CLINICAL DATA:  Acute right shoulder pain for 1 day. No known injury.   EXAM: RIGHT SHOULDER - 2+ VIEW  COMPARISON:  07/07/2009  FINDINGS: There is no evidence of acute fracture, subluxation or dislocation.  Degenerative changes at the [facility] joint are again noted with undersurface spurring.  Mild glenohumeral joint degenerative changes are noted.  No focal bony lesions are present.  IMPRESSION: No evidence of acute abnormality.  Degenerative changes as described.   Electronically Signed   By: Margarette Canada M.D.   On: 10/11/2014 15:08   Dg Chest Port 1 View  10/11/2014   CLINICAL DATA:  Chest pain and RIGHT shoulder pain  EXAM: PORTABLE CHEST - 1 VIEW  COMPARISON:  Chest CT 09/12/2014  FINDINGS: Normal cardiac silhouette. There is mild central venous pulmonary congestion. No effusion, infiltrate, pneumothorax. No acute osseous abnormality.  IMPRESSION: Mild central venous congestion.   Electronically Signed   By: Suzy Bouchard M.D.   On: 10/11/2014 15:07     EKG Interpretation None      MDM   Final diagnoses:  Right shoulder pain  Chest pain   Normal/unchanged EKG. Chronic A. fib. Not in congestive heart failure clinically. Well oxygenated. Degenerative changes to the shoulder on x-ray. Clinical exam consistent with a rotator cuff tendinitis. No UTI.  Considering her age and hesitant to prescribe additional narcotics. She does take oxycodone a.m. and p.m. Occasionally will take a midday dose when necessary. We discussed using lidocaine patch, or diclofenac patch over the area of discomfort. Given prescriptions for both. Follow up with her primary care physician.    Tanna Furry, MD 10/11/14 [phone]

## 2015-06-11 ENCOUNTER — Other Ambulatory Visit: Payer: Self-pay | Admitting: Cardiology

## 2016-01-20 IMAGING — CR DG CHEST 1V PORT
1 series · 1 of 1 positions shown · non-contrast
Comparison: Chest CT 09/12/2014

CLINICAL DATA: Chest pain and RIGHT shoulder pain

EXAM:
PORTABLE CHEST - 1 VIEW

[ap]
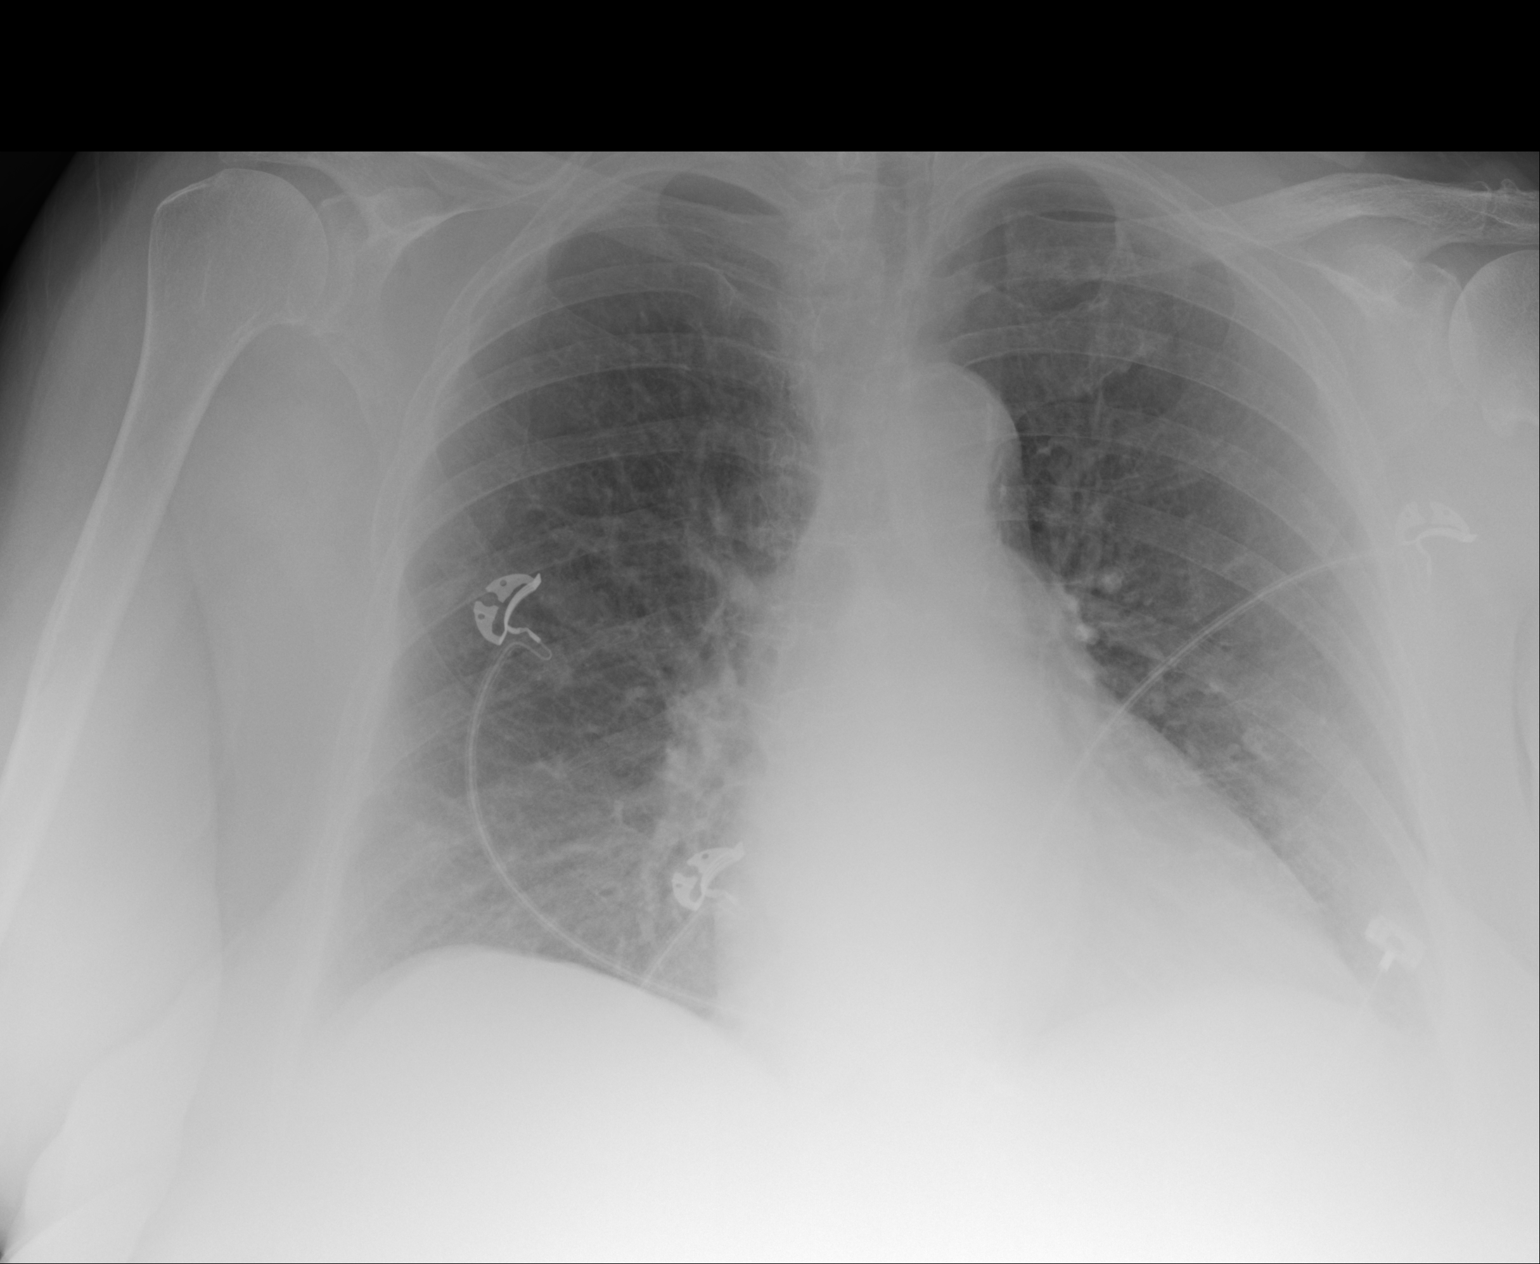

[1 of 1 positions shown; findings below may reference images not displayed]

FINDINGS: Normal cardiac silhouette. There is mild central venous pulmonary
congestion. No effusion, infiltrate, pneumothorax. No acute osseous
abnormality.
IMPRESSION: Mild central venous congestion.

## 2016-02-16 ENCOUNTER — Emergency Department (HOSPITAL_COMMUNITY): Payer: Medicare Other

## 2016-02-16 ENCOUNTER — Emergency Department (HOSPITAL_COMMUNITY)
Admission: EM | Admit: 2016-02-16 | Discharge: 2016-02-16 | Disposition: A | Discharge status: Home / Self Care | Payer: Medicare Other | Attending: Emergency Medicine | Admitting: Emergency Medicine

## 2016-02-16 ENCOUNTER — Encounter (HOSPITAL_COMMUNITY): Payer: Self-pay

## 2016-02-16 DIAGNOSIS — I129 Hypertensive chronic kidney disease with stage 1 through stage 4 chronic kidney disease, or unspecified chronic kidney disease: Secondary | ICD-10-CM | POA: Diagnosis not present

## 2016-02-16 DIAGNOSIS — S8001XA Contusion of right knee, initial encounter: Secondary | ICD-10-CM | POA: Diagnosis not present

## 2016-02-16 DIAGNOSIS — Y929 Unspecified place or not applicable: Secondary | ICD-10-CM | POA: Insufficient documentation

## 2016-02-16 DIAGNOSIS — W06XXXA Fall from bed, initial encounter: Secondary | ICD-10-CM | POA: Insufficient documentation

## 2016-02-16 DIAGNOSIS — S8002XA Contusion of left knee, initial encounter: Secondary | ICD-10-CM | POA: Insufficient documentation

## 2016-02-16 DIAGNOSIS — G9389 Other specified disorders of brain: Secondary | ICD-10-CM | POA: Diagnosis not present

## 2016-02-16 DIAGNOSIS — Z79899 Other long term (current) drug therapy: Secondary | ICD-10-CM | POA: Insufficient documentation

## 2016-02-16 DIAGNOSIS — Y9389 Activity, other specified: Secondary | ICD-10-CM | POA: Diagnosis not present

## 2016-02-16 DIAGNOSIS — S8991XA Unspecified injury of right lower leg, initial encounter: Secondary | ICD-10-CM | POA: Diagnosis present

## 2016-02-16 DIAGNOSIS — J449 Chronic obstructive pulmonary disease, unspecified: Secondary | ICD-10-CM | POA: Diagnosis not present

## 2016-02-16 DIAGNOSIS — N390 Urinary tract infection, site not specified: Secondary | ICD-10-CM | POA: Diagnosis not present

## 2016-02-16 DIAGNOSIS — Z85820 Personal history of malignant melanoma of skin: Secondary | ICD-10-CM | POA: Insufficient documentation

## 2016-02-16 DIAGNOSIS — S7001XA Contusion of right hip, initial encounter: Secondary | ICD-10-CM | POA: Diagnosis not present

## 2016-02-16 DIAGNOSIS — W19XXXA Unspecified fall, initial encounter: Secondary | ICD-10-CM

## 2016-02-16 DIAGNOSIS — N189 Chronic kidney disease, unspecified: Secondary | ICD-10-CM | POA: Insufficient documentation

## 2016-02-16 DIAGNOSIS — Y999 Unspecified external cause status: Secondary | ICD-10-CM | POA: Insufficient documentation

## 2016-02-16 LAB — COMPREHENSIVE METABOLIC PANEL
ALT: 33 U/L (ref 14–54)
ANION GAP: 10 (ref 5–15)
AST: 52 U/L — ABNORMAL HIGH (ref 15–41)
Albumin: 3.1 g/dL — ABNORMAL LOW (ref 3.5–5.0)
Alkaline Phosphatase: 51 U/L (ref 38–126)
BILIRUBIN TOTAL: 0.7 mg/dL (ref 0.3–1.2)
BUN: 10 mg/dL (ref 6–20)
CO2: 30 mmol/L (ref 22–32)
Calcium: 9.6 mg/dL (ref 8.9–10.3)
Chloride: 102 mmol/L (ref 101–111)
Creatinine, Ser: 1.06 mg/dL — ABNORMAL HIGH (ref 0.44–1.00)
GFR calc Af Amer: 54 mL/min — ABNORMAL LOW (ref >60)
GFR, EST NON AFRICAN AMERICAN: 47 mL/min — AB (ref >60)
Glucose, Bld: 180 mg/dL — ABNORMAL HIGH (ref 65–99)
POTASSIUM: 3.3 mmol/L — AB (ref 3.5–5.1)
Sodium: 142 mmol/L (ref 135–145)
TOTAL PROTEIN: 6.2 g/dL — AB (ref 6.5–8.1)

## 2016-02-16 LAB — CBC WITH DIFFERENTIAL/PLATELET
Basophils Absolute: 0.1 10*3/uL (ref 0.0–0.1)
Basophils Relative: 1 %
Eosinophils Absolute: 0.1 10*3/uL (ref 0.0–0.7)
Eosinophils Relative: 2 %
HEMATOCRIT: 44.2 % (ref 36.0–46.0)
Hemoglobin: 14.1 g/dL (ref 12.0–15.0)
LYMPHS PCT: 26 %
Lymphs Abs: 2.1 10*3/uL (ref 0.7–4.0)
MCH: 30.6 pg (ref 26.0–34.0)
MCHC: 31.9 g/dL (ref 30.0–36.0)
MCV: 95.9 fL (ref 78.0–100.0)
MONO ABS: 0.7 10*3/uL (ref 0.1–1.0)
MONOS PCT: 9 %
NEUTROS ABS: 5.3 10*3/uL (ref 1.7–7.7)
Neutrophils Relative %: 62 %
Platelets: 136 10*3/uL — ABNORMAL LOW (ref 150–400)
RBC: 4.61 MIL/uL (ref 3.87–5.11)
RDW: 15.2 % (ref 11.5–15.5)
WBC: 8.3 10*3/uL (ref 4.0–10.5)

## 2016-02-16 LAB — URINALYSIS, ROUTINE W REFLEX MICROSCOPIC
Bilirubin Urine: NEGATIVE
Glucose, UA: NEGATIVE mg/dL
HGB URINE DIPSTICK: NEGATIVE
KETONES UR: NEGATIVE mg/dL
Nitrite: NEGATIVE
PROTEIN: NEGATIVE mg/dL
Specific Gravity, Urine: 1.01 (ref 1.005–1.030)
pH: 7 (ref 5.0–8.0)

## 2016-02-16 LAB — URINALYSIS, MICROSCOPIC (REFLEX): RBC / HPF: NONE SEEN RBC/hpf (ref 0–5)

## 2016-02-16 MED ORDER — CEPHALEXIN 500 MG PO CAPS: 500.0000 mg | ORAL_CAPSULE | Freq: Four times a day (QID) | ORAL | 0 refills | Status: DC

## 2016-02-16 NOTE — ED Triage Notes (Signed)
EMS reports pt fell last night around midnight trying to get from wheel chair to bed.  Reports she couldn't get up to the bed so she just pulled her covers off of the bed and slept in the floor.  EMS says the call came threw life alert.  EMS says pt's daughter was at her house when they arrived.  Pt c/o pain in lower back and r hip.  Reports chronic lower back pain but says hurts worse than usual today.

## 2016-02-16 NOTE — ED Provider Notes (Signed)
[facility] DEPT Provider Note   CSN: [id number] Arrival date & time: 02/16/16  5631     History   Chief Complaint Chief Complaint  Patient presents with  . Fall    HPI [name] is a 82 y.o. female.  Patient states she slipped off the bed onto her buttocks and has some right hip pain   The history is provided by the patient. No language interpreter was used.  Fall  This is a new problem. The current episode started 12 to 24 hours ago. The problem occurs rarely. The problem has been resolved. Pertinent negatives include no chest pain, no abdominal pain and no headaches. Nothing aggravates the symptoms. Nothing relieves the symptoms.    Past Medical History:  Diagnosis Date  . Anemia   . Atrial fibrillation ([place])   . Back pain   . Bacterial pneumonia   . Cancer (HCC)    Skin   . Chronic respiratory failure ([place])   . COPD (chronic obstructive pulmonary disease) ([place])   . Depression   . Diverticulosis   . Fatty liver   . Fibromyalgia   . Gastric polyp 09/08/11   at the anastomosis-inflammatory  . Generalized anxiety disorder   . GERD (gastroesophageal reflux disease)   . 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 Hypercholesteremia   . Hypertension   . Insomnia   . Internal hemorrhoids   . On home O2    qhs  . Rheumatoid arteritis   . Ulcer ([place])    stomach  . Vitamin D deficiency     Patient Active Problem List   Diagnosis Date Noted  . Syncope 09/12/2014  . CKD (chronic kidney disease) 09/12/2014  . Hyperglycemia 09/12/2014  . Hypoxia 09/12/2014  . Acute encephalopathy 09/12/2014  . Chest pain 07/21/2013  . Chronic respiratory failure ([place]) 07/21/2013  . Hypokalemia 07/21/2013  . Atrial fibrillation with RVR ([place]) 07/21/2013  . Knee pain 05/21/2013  . Fibromyalgia   . COPD (chronic obstructive pulmonary disease) ([place])   . 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 GERD (gastroesophageal reflux disease)   . Rheumatoid arteritis   .  Back pain   . Fracture of distal fibula 09/20/2010    Past Surgical History:  Procedure Laterality Date  . ABDOMINAL HYSTERECTOMY    . ABDOMINAL SURGERY     removed patial stomach from ulcer  . COLONOSCOPY  04-2001   internal hemorrhoids, panocolonic diverticulosis, and colon polyps   . UPPER GASTROINTESTINAL ENDOSCOPY      OB History    No data available       Home Medications    Prior to Admission medications   Medication Sig Start Date End Date Taking? Authorizing Provider  apixaban (ELIQUIS) 2.5 MG TABS tablet Take 2.5 mg by mouth 2 (two) times daily.   Yes Historical Provider, MD  CARTIA XT 300 MG 24 hr capsule TAKE ONE CAPSULE BY MOUTH EVERY DAY 09/25/14  Yes Arnoldo Lenis, MD  Cholecalciferol (VITAMIN D3) 1000 UNITS CAPS Take 1 capsule by mouth daily.   Yes Historical Provider, MD  cyanocobalamin 100 MCG tablet Take 100 mcg by mouth daily.   Yes Historical Provider, MD  DULoxetine (CYMBALTA) 60 MG capsule Take 60 mg by mouth daily.     Yes Historical Provider, MD  ferrous gluconate (FERGON) 325 MG tablet Take 325 mg by mouth daily.    Yes Historical Provider, MD  Fluticasone-Salmeterol (ADVAIR DISKUS) 250-50 MCG/DOSE AEPB Inhale 1 puff into the lungs every 12 (twelve)  hours.   Yes Historical Provider, MD  folic acid (FOLVITE) 1 MG tablet Take 1 mg by mouth daily.   Yes Historical Provider, MD  guaifenesin (ROBITUSSIN) 100 MG/5ML syrup Take 200 mg by mouth 3 (three) times daily as needed for cough.   Yes Historical Provider, MD  LORazepam (ATIVAN) 0.5 MG tablet Take 1 tablet (0.5 mg total) by mouth every 6 (six) hours as needed for anxiety. 07/29/13  Yes Jessica U Vann, DO  LYRICA 100 MG capsule Take 100 mg by mouth 3 (three) times daily. 12/07/15  Yes Historical Provider, MD  methotrexate (RHEUMATREX) 2.5 MG tablet Take 12.5 mg by mouth once a week. *Taken on Thursdays at 1200Caution:Chemotherapy. Protect from light.   Yes Historical Provider, MD  Multiple Vitamins-Minerals  (MULTIVITAMIN WITH MINERALS) tablet Take 1 tablet by mouth daily.   Yes Historical Provider, MD  NUCYNTA ER 50 MG 12 hr tablet Take 50 mg by mouth 2 (two) times daily. 01/28/16  Yes Historical Provider, MD  pantoprazole (PROTONIX) 40 MG tablet Take 40 mg by mouth 2 (two) times daily.    Yes Historical Provider, MD  polyethylene glycol (MIRALAX / GLYCOLAX) packet Take 17 g by mouth daily as needed for mild constipation or moderate constipation.    Yes Historical Provider, MD  predniSONE (DELTASONE) 5 MG tablet Take 5 mg by mouth daily.     Yes Historical Provider, MD  pregabalin (LYRICA) 50 MG capsule Take 50 mg by mouth 2 (two) times daily.    Yes Historical Provider, MD  Probiotic Product (PROBIOTIC FORMULA PO) Take 1 tablet by mouth daily.    Yes Historical Provider, MD  raloxifene (EVISTA) 60 MG tablet Take 60 mg by mouth daily.   Yes Historical Provider, MD  rosuvastatin (CRESTOR) 20 MG tablet Take 20 mg by mouth daily.    Yes Historical Provider, MD  vitamin C (ASCORBIC ACID) 500 MG tablet Take 500 mg by mouth daily.   Yes Historical Provider, MD  albuterol (PROAIR HFA) 108 (90 BASE) MCG/ACT inhaler Inhale 2 puffs into the lungs every 6 (six) hours as needed for wheezing or shortness of breath. For rescue     Historical Provider, MD  cephALEXin (KEFLEX) 500 MG capsule Take 1 capsule (500 mg total) by mouth 4 (four) times daily. 02/16/16   Milton Ferguson, MD  diclofenac (FLECTOR) 1.3 % PTCH Place 1 patch onto the skin 2 (two) times daily. Patient not taking: Reported on 02/16/2016 10/11/14   Tanna Furry, MD  guaiFENesin-dextromethorphan [facility] DM) 100-10 MG/5ML syrup Take 5 mLs by mouth every 6 (six) hours. Patient not taking: Reported on 09/12/2014 07/29/13   Geradine Girt, DO  lidocaine (LIDODERM) 5 % Place 1 patch onto the skin daily. Remove & Discard patch within 12 hours or as directed by MD Patient not taking: Reported on 02/16/2016 10/11/14   Tanna Furry, MD  oxyCODONE-acetaminophen  (PERCOCET) 7.5-325 MG per tablet Take 1 tablet by mouth 4 (four) times daily as needed. 07/29/13   Geradine Girt, DO    Family History Family History  Problem Relation Age of Onset  . Heart disease Father   . Kidney disease Mother     kidney cancer  . Colon cancer Daughter 36    Social History Social History  Substance Use Topics  . Smoking status: Never Smoker  . Smokeless tobacco: Never Used  . Alcohol use No     Allergies   Other; Tape; Lipitor [atorvastatin calcium]; and Niaspan [niacin er]  Review of Systems Review of Systems  Constitutional: Negative for appetite change and fatigue.  HENT: Negative for congestion, ear discharge and sinus pressure.   Eyes: Negative for discharge.  Respiratory: Negative for cough.   Cardiovascular: Negative for chest pain.  Gastrointestinal: Negative for abdominal pain and diarrhea.  Genitourinary: Negative for frequency and hematuria.  Musculoskeletal: Negative for back pain.       Right hip pain  Skin: Negative for rash.  Neurological: Negative for seizures and headaches.  Psychiatric/Behavioral: Negative for hallucinations.     Physical Exam Updated Vital Signs BP 144/82   Pulse 96   Temp 98.4 F (36.9 C) (Oral)   Resp 21   Wt 215 lb (97.5 kg)   SpO2 94%   BMI 34.70 kg/m   Physical Exam  Constitutional: She is oriented to person, place, and time. She appears well-developed.  HENT:  Head: Normocephalic.  Eyes: Conjunctivae and EOM are normal. No scleral icterus.  Neck: Neck supple. No thyromegaly present.  Cardiovascular: Normal rate and regular rhythm.  Exam reveals no gallop and no friction rub.   No murmur heard. Pulmonary/Chest: No stridor. She has no wheezes. She has no rales. She exhibits no tenderness.  Abdominal: She exhibits no distension. There is no tenderness. There is no rebound.  Musculoskeletal: Normal range of motion. She exhibits no edema.  Mild tenderness to right hip. Bruising to both knees    Lymphadenopathy:    She has no cervical adenopathy.  Neurological: She is oriented to person, place, and time. She exhibits normal muscle tone. Coordination normal.  Skin: No rash noted. No erythema.  Psychiatric: She has a normal mood and affect. Her behavior is normal.     ED Treatments / Results  Labs (all labs ordered are listed, but only abnormal results are displayed) Labs Reviewed  CBC WITH DIFFERENTIAL/PLATELET - Abnormal; Notable for the following:       Result Value   Platelets 136 (*)    All other components within normal limits  COMPREHENSIVE METABOLIC PANEL - Abnormal; Notable for the following:    Potassium 3.3 (*)    Glucose, Bld 180 (*)    Creatinine, Ser 1.06 (*)    Total Protein 6.2 (*)    Albumin 3.1 (*)    AST 52 (*)    GFR calc non Af Amer 47 (*)    GFR calc Af Amer 54 (*)    All other components within normal limits  URINALYSIS, ROUTINE W REFLEX MICROSCOPIC - Abnormal; Notable for the following:    Color, Urine YELLOW (*)    APPearance HAZY (*)    Leukocytes, UA TRACE (*)    All other components within normal limits  URINALYSIS, MICROSCOPIC (REFLEX) - Abnormal; Notable for the following:    Bacteria, UA MANY (*)    Squamous Epithelial / LPF 6-30 (*)    All other components within normal limits  URINE CULTURE    EKG  EKG Interpretation  Date/Time:  Tuesday February 16 2016 08:33:46 EST Ventricular Rate:  88 PR Interval:    QRS Duration: 95 QT Interval:  377 QTC Calculation: 457 R Axis:   11 Text Interpretation:  Atrial fibrillation Borderline low voltage, extremity leads Minimal ST depression, lateral leads Confirmed by Laterrian Hevener  MD, Catha Ontko [phone]) on 02/16/2016 1:36:40 PM       Radiology Ct Head Wo Contrast  Result Date: 02/16/2016 CLINICAL DATA:  [place] last night remaining on the floor all night, some right-sided pain EXAM: CT HEAD  WITHOUT CONTRAST TECHNIQUE: Contiguous axial images were obtained from the base of the skull through the  vertex without intravenous contrast. COMPARISON:  CT brain scan of 09/12/2014 FINDINGS: Brain: The ventricular system remains slightly prominent, as are the cortical sulci, indicative of diffuse atrophy. Moderate small vessel ischemic change has progressed somewhat throughout the periventricular white matter. No hemorrhage, mass lesion, or acute infarction is seen. Vascular: No vascular abnormality is seen on this unenhanced study. Skull: No calvarial abnormality is seen. Sinuses/Orbits: The paranasal sinuses are well pneumatized. Other: None IMPRESSION: Some progression of small vessel ischemic change throughout the periventricular white matter. No acute interval change. Diffuse atrophy. Electronically Signed   By: Ivar Drape M.D.   On: 02/16/2016 10:36   Dg Knee Complete 4 Views Left  Result Date: 02/16/2016 CLINICAL DATA:  Fall. EXAM: LEFT KNEE - COMPLETE 4+ VIEW COMPARISON:  09/20/2010 . FINDINGS: Left knee joint effusion is noted. No evidence of fracture or dislocation. No acute bony abnormality identified. IMPRESSION: Left knee joint effusion. Diffuse degenerative change left knee. No evidence of fracture or dislocation . Electronically Signed   By: Marcello Moores  Register   On: 02/16/2016 10:23   Dg Knee Complete 4 Views Right  Result Date: 02/16/2016 CLINICAL DATA:  Fall.  Initial evaluation . EXAM: RIGHT KNEE - COMPLETE 4+ VIEW COMPARISON:  02/16/2016. FINDINGS: Diffuse osteopenia and degenerative change. Tiny corticated bony density noted posterior to the patella, possibly tiny loose body. No evidence of fracture or dislocation. No evidence of effusion. IMPRESSION: Diffuse osteopenia and degenerative change. No acute abnormality identified. Electronically Signed   By: Marcello Moores  Register   On: 02/16/2016 10:22   Dg Hip Unilat W Or Wo Pelvis 2-3 Views Right  Result Date: 02/16/2016 CLINICAL DATA:  Fall. EXAM: DG HIP (WITH OR WITHOUT PELVIS) 2-3V RIGHT COMPARISON:  No recent prior. FINDINGS: Degenerative  changes lumbar spine and both hips left femoral neck fracture cannot be excluded left hip series suggested for further evaluation. Left hip series suggested for further evaluation. Diffuse osteopenia. Pelvic calcifications consistent phleboliths. IMPRESSION: 1. Right hip appears unremarkable.  No evidence fracture. 2. Left femoral neck fracture cannot be excluded, left hip series suggested for further evaluation . Electronically Signed   By: Marcello Moores  Register   On: 02/16/2016 10:20    Procedures Procedures (including critical care time)  Medications Ordered in ED Medications - No data to display   Initial Impression / Assessment and Plan / ED Course  I have reviewed the triage vital signs and the nursing notes.  Pertinent labs & imaging results that were available during my care of the patient were reviewed by me and considered in my medical decision making (see chart for details).  Clinical Course     X-rays do not show any fractures or dislocations. Urine shows urinary tract infection. Patient diagnosis is fall contusion to right hip and both knees and a UTI. She has pain medicine she will take over own. Patient given prescription for Keflex and will follow-up with her doctor in 2-3 days  Final Clinical Impressions(s) / ED Diagnoses   Final diagnoses:  Fall, initial encounter    New Prescriptions New Prescriptions   CEPHALEXIN (KEFLEX) 500 MG CAPSULE    Take 1 capsule (500 mg total) by mouth 4 (four) times daily.     Milton Ferguson, MD 02/16/16 [phone]

## 2016-02-16 NOTE — ED Notes (Signed)
cbg 173 per ems

## 2016-02-16 NOTE — Discharge Instructions (Signed)
Follow-up with your family doctor later this week for recheck

## 2016-02-18 LAB — URINE CULTURE

## 2016-07-26 ENCOUNTER — Encounter (HOSPITAL_COMMUNITY): Payer: Self-pay | Admitting: *Deleted

## 2016-07-26 ENCOUNTER — Emergency Department (HOSPITAL_COMMUNITY): Payer: Medicare Other

## 2016-07-26 ENCOUNTER — Inpatient Hospital Stay (HOSPITAL_COMMUNITY)
Admission: EM | Admit: 2016-07-26 | Discharge: 2016-07-28 | DRG: 684 | Disposition: A | Discharge status: Skilled Nursing Facility | Payer: Medicare Other | Attending: Family Medicine | Admitting: Family Medicine

## 2016-07-26 DIAGNOSIS — W19XXXA Unspecified fall, initial encounter: Secondary | ICD-10-CM

## 2016-07-26 DIAGNOSIS — M549 Dorsalgia, unspecified: Secondary | ICD-10-CM | POA: Diagnosis present

## 2016-07-26 DIAGNOSIS — Z79891 Long term (current) use of opiate analgesic: Secondary | ICD-10-CM

## 2016-07-26 DIAGNOSIS — N179 Acute kidney failure, unspecified: Secondary | ICD-10-CM | POA: Diagnosis present

## 2016-07-26 DIAGNOSIS — R112 Nausea with vomiting, unspecified: Secondary | ICD-10-CM

## 2016-07-26 DIAGNOSIS — I482 Chronic atrial fibrillation, unspecified: Secondary | ICD-10-CM

## 2016-07-26 DIAGNOSIS — E559 Vitamin D deficiency, unspecified: Secondary | ICD-10-CM | POA: Diagnosis present

## 2016-07-26 DIAGNOSIS — K760 Fatty (change of) liver, not elsewhere classified: Secondary | ICD-10-CM | POA: Diagnosis present

## 2016-07-26 DIAGNOSIS — E785 Hyperlipidemia, unspecified: Secondary | ICD-10-CM | POA: Diagnosis present

## 2016-07-26 DIAGNOSIS — Z7952 Long term (current) use of systemic steroids: Secondary | ICD-10-CM

## 2016-07-26 DIAGNOSIS — K219 Gastro-esophageal reflux disease without esophagitis: Secondary | ICD-10-CM | POA: Diagnosis present

## 2016-07-26 DIAGNOSIS — R197 Diarrhea, unspecified: Secondary | ICD-10-CM

## 2016-07-26 DIAGNOSIS — R410 Disorientation, unspecified: Secondary | ICD-10-CM | POA: Diagnosis not present

## 2016-07-26 DIAGNOSIS — I10 Essential (primary) hypertension: Secondary | ICD-10-CM | POA: Diagnosis present

## 2016-07-26 DIAGNOSIS — Z888 Allergy status to other drugs, medicaments and biological substances status: Secondary | ICD-10-CM

## 2016-07-26 DIAGNOSIS — F411 Generalized anxiety disorder: Secondary | ICD-10-CM | POA: Diagnosis present

## 2016-07-26 DIAGNOSIS — M069 Rheumatoid arthritis, unspecified: Secondary | ICD-10-CM | POA: Diagnosis present

## 2016-07-26 DIAGNOSIS — Z8711 Personal history of peptic ulcer disease: Secondary | ICD-10-CM | POA: Diagnosis not present

## 2016-07-26 DIAGNOSIS — I517 Cardiomegaly: Secondary | ICD-10-CM | POA: Diagnosis present

## 2016-07-26 DIAGNOSIS — Z7901 Long term (current) use of anticoagulants: Secondary | ICD-10-CM | POA: Diagnosis not present

## 2016-07-26 DIAGNOSIS — K529 Noninfective gastroenteritis and colitis, unspecified: Secondary | ICD-10-CM | POA: Diagnosis present

## 2016-07-26 DIAGNOSIS — F329 Major depressive disorder, single episode, unspecified: Secondary | ICD-10-CM | POA: Diagnosis present

## 2016-07-26 DIAGNOSIS — R9431 Abnormal electrocardiogram [ECG] [EKG]: Secondary | ICD-10-CM | POA: Diagnosis not present

## 2016-07-26 DIAGNOSIS — Y92009 Unspecified place in unspecified non-institutional (private) residence as the place of occurrence of the external cause: Secondary | ICD-10-CM | POA: Diagnosis not present

## 2016-07-26 DIAGNOSIS — I4581 Long QT syndrome: Secondary | ICD-10-CM | POA: Diagnosis present

## 2016-07-26 DIAGNOSIS — E869 Volume depletion, unspecified: Secondary | ICD-10-CM | POA: Diagnosis present

## 2016-07-26 DIAGNOSIS — Z9981 Dependence on supplemental oxygen: Secondary | ICD-10-CM | POA: Diagnosis not present

## 2016-07-26 DIAGNOSIS — R4182 Altered mental status, unspecified: Secondary | ICD-10-CM | POA: Diagnosis present

## 2016-07-26 DIAGNOSIS — I159 Secondary hypertension, unspecified: Secondary | ICD-10-CM | POA: Diagnosis not present

## 2016-07-26 DIAGNOSIS — J449 Chronic obstructive pulmonary disease, unspecified: Secondary | ICD-10-CM | POA: Diagnosis present

## 2016-07-26 DIAGNOSIS — E876 Hypokalemia: Secondary | ICD-10-CM | POA: Diagnosis present

## 2016-07-26 DIAGNOSIS — I4891 Unspecified atrial fibrillation: Secondary | ICD-10-CM | POA: Diagnosis present

## 2016-07-26 DIAGNOSIS — Z91048 Other nonmedicinal substance allergy status: Secondary | ICD-10-CM

## 2016-07-26 DIAGNOSIS — Z79899 Other long term (current) drug therapy: Secondary | ICD-10-CM

## 2016-07-26 DIAGNOSIS — I360 Nonrheumatic tricuspid (valve) stenosis: Secondary | ICD-10-CM | POA: Diagnosis not present

## 2016-07-26 DIAGNOSIS — M797 Fibromyalgia: Secondary | ICD-10-CM | POA: Diagnosis present

## 2016-07-26 LAB — URINALYSIS, ROUTINE W REFLEX MICROSCOPIC
Bilirubin Urine: NEGATIVE
Glucose, UA: NEGATIVE mg/dL
Hgb urine dipstick: NEGATIVE
Ketones, ur: NEGATIVE mg/dL
Leukocytes, UA: NEGATIVE
Nitrite: NEGATIVE
PH: 5 (ref 5.0–8.0)
Protein, ur: 30 mg/dL — AB
RBC / HPF: NONE SEEN RBC/hpf (ref 0–5)
SPECIFIC GRAVITY, URINE: 1.018 (ref 1.005–1.030)

## 2016-07-26 LAB — CBC WITH DIFFERENTIAL/PLATELET
BASOS ABS: 0.1 10*3/uL (ref 0.0–0.1)
BASOS PCT: 0 %
Eosinophils Absolute: 0.4 10*3/uL (ref 0.0–0.7)
Eosinophils Relative: 4 %
HEMATOCRIT: 40.9 % (ref 36.0–46.0)
Hemoglobin: 13.7 g/dL (ref 12.0–15.0)
Lymphocytes Relative: 18 %
Lymphs Abs: 2.2 10*3/uL (ref 0.7–4.0)
MCH: 30.9 pg (ref 26.0–34.0)
MCHC: 33.5 g/dL (ref 30.0–36.0)
MCV: 92.1 fL (ref 78.0–100.0)
MONO ABS: 1.6 10*3/uL — AB (ref 0.1–1.0)
Monocytes Relative: 13 %
NEUTROS ABS: 7.9 10*3/uL — AB (ref 1.7–7.7)
Neutrophils Relative %: 65 %
Platelets: 181 10*3/uL (ref 150–400)
RBC: 4.44 MIL/uL (ref 3.87–5.11)
RDW: 15.4 % (ref 11.5–15.5)
WBC: 12.1 10*3/uL — AB (ref 4.0–10.5)

## 2016-07-26 LAB — CK: CK TOTAL: 39 U/L (ref 38–234)

## 2016-07-26 LAB — COMPREHENSIVE METABOLIC PANEL
ALT: 18 U/L (ref 14–54)
ANION GAP: 10 (ref 5–15)
AST: 23 U/L (ref 15–41)
Albumin: 2.9 g/dL — ABNORMAL LOW (ref 3.5–5.0)
Alkaline Phosphatase: 57 U/L (ref 38–126)
BILIRUBIN TOTAL: 0.8 mg/dL (ref 0.3–1.2)
BUN: 18 mg/dL (ref 6–20)
CO2: 25 mmol/L (ref 22–32)
Calcium: 9.2 mg/dL (ref 8.9–10.3)
Chloride: 102 mmol/L (ref 101–111)
Creatinine, Ser: 1.55 mg/dL — ABNORMAL HIGH (ref 0.44–1.00)
GFR, EST AFRICAN AMERICAN: 34 mL/min — AB (ref >60)
GFR, EST NON AFRICAN AMERICAN: 29 mL/min — AB (ref >60)
Glucose, Bld: 145 mg/dL — ABNORMAL HIGH (ref 65–99)
POTASSIUM: 2.7 mmol/L — AB (ref 3.5–5.1)
Sodium: 137 mmol/L (ref 135–145)
TOTAL PROTEIN: 5.8 g/dL — AB (ref 6.5–8.1)

## 2016-07-26 LAB — I-STAT TROPONIN, ED: Troponin i, poc: 0.01 ng/mL (ref 0.00–0.08)

## 2016-07-26 LAB — TSH: TSH: 2.992 u[IU]/mL (ref 0.350–4.500)

## 2016-07-26 LAB — I-STAT CG4 LACTIC ACID, ED: LACTIC ACID, VENOUS: 1.2 mmol/L (ref 0.5–1.9)

## 2016-07-26 LAB — LIPASE, BLOOD: LIPASE: 19 U/L (ref 11–51)

## 2016-07-26 LAB — MAGNESIUM: Magnesium: 2 mg/dL (ref 1.7–2.4)

## 2016-07-26 MED ORDER — SODIUM CHLORIDE 0.9 % IV BOLUS (SEPSIS)
1000.0000 mL | Freq: Once | INTRAVENOUS | Status: AC
  Administered 2016-07-26: 1000 mL via INTRAVENOUS

## 2016-07-26 MED ORDER — POTASSIUM CHLORIDE 10 MEQ/100ML IV SOLN
10.0000 meq | Freq: Once | INTRAVENOUS | Status: AC
  Administered 2016-07-26: 10 meq via INTRAVENOUS
  Filled 2016-07-26: qty 100

## 2016-07-26 MED ORDER — FAMOTIDINE IN NACL 20-0.9 MG/50ML-% IV SOLN
20.0000 mg | INTRAVENOUS | Status: DC
  Administered 2016-07-26 – 2016-07-28 (×3): 20 mg via INTRAVENOUS
  Filled 2016-07-26 (×3): qty 50

## 2016-07-26 MED ORDER — METRONIDAZOLE IN NACL 5-0.79 MG/ML-% IV SOLN: 500.0000 mg | Freq: Three times a day (TID) | INTRAVENOUS | Status: DC

## 2016-07-26 MED ORDER — ONDANSETRON HCL 4 MG/2ML IJ SOLN
4.0000 mg | Freq: Once | INTRAMUSCULAR | Status: AC
  Administered 2016-07-26: 4 mg via INTRAVENOUS
  Filled 2016-07-26: qty 2

## 2016-07-26 MED ORDER — PROCHLORPERAZINE EDISYLATE 5 MG/ML IJ SOLN
5.0000 mg | INTRAMUSCULAR | Status: DC | PRN
  Administered 2016-07-28: 5 mg via INTRAVENOUS
  Filled 2016-07-26: qty 2

## 2016-07-26 MED ORDER — IOPAMIDOL (ISOVUE-300) INJECTION 61%: 100.0000 mL | Freq: Once | INTRAVENOUS | Status: DC | PRN

## 2016-07-26 MED ORDER — POTASSIUM CHLORIDE IN NACL 40-0.9 MEQ/L-% IV SOLN
INTRAVENOUS | Status: AC
  Administered 2016-07-26: 88 mL/h via INTRAVENOUS

## 2016-07-26 MED ORDER — MAGNESIUM SULFATE 2 GM/50ML IV SOLN
2.0000 g | Freq: Once | INTRAVENOUS | Status: AC
  Administered 2016-07-26: 2 g via INTRAVENOUS
  Filled 2016-07-26: qty 50

## 2016-07-26 MED ORDER — CIPROFLOXACIN IN D5W 400 MG/200ML IV SOLN: 400.0000 mg | Freq: Two times a day (BID) | INTRAVENOUS | Status: DC

## 2016-07-26 NOTE — ED Notes (Signed)
Date and time results received: 07/26/16  0524 (use smartphrase ".now" to insert current time)  Test: potassium  Critical Value: 2.7  Name of Provider Notified: Roxanne Mins  Orders Received? Or Actions Taken?:

## 2016-07-26 NOTE — ED Provider Notes (Signed)
[facility] DEPT Provider Note   CSN: [id number] Arrival date & time: 07/26/16  0353     History   Chief Complaint Chief Complaint  Patient presents with  . Emesis  . Diarrhea    HPI [name] is a 83 y.o. female.  The history is provided by the patient.  Emesis   Associated symptoms include diarrhea.  Diarrhea   Associated symptoms include vomiting.  She is an exceedingly poor historian. She is complaining of vomiting and diarrhea for the past 2 weeks. She states she fell today, but is unable to tell me any details of how she fell. She states that she got hurt when she fell but will not tell me what is hurting. She has had subjective fevers as well as chills and sweats. She has not treated herself with anything. There've been no known sick contacts. She claims ongoing compliance with her medications.  Past Medical History:  Diagnosis Date  . Anemia   . Atrial fibrillation ([place])   . Back pain   . Bacterial pneumonia   . Cancer (HCC)    Skin   . Chronic respiratory failure ([place])   . COPD (chronic obstructive pulmonary disease) ([place])   . Depression   . Diverticulosis   . Fatty liver   . Fibromyalgia   . Gastric polyp 09/08/11   at the anastomosis-inflammatory  . Generalized anxiety disorder   . GERD (gastroesophageal reflux disease)   . 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 Hypercholesteremia   . Hypertension   . Insomnia   . Internal hemorrhoids   . On home O2    qhs  . Rheumatoid arteritis   . Ulcer    stomach  . Vitamin D deficiency     Patient Active Problem List   Diagnosis Date Noted  . Syncope 09/12/2014  . CKD (chronic kidney disease) 09/12/2014  . Hyperglycemia 09/12/2014  . Hypoxia 09/12/2014  . Acute encephalopathy 09/12/2014  . Chest pain 07/21/2013  . Chronic respiratory failure ([place]) 07/21/2013  . Hypokalemia 07/21/2013  . Atrial fibrillation with RVR ([place]) 07/21/2013  . Knee pain 05/21/2013  . Fibromyalgia   . COPD  (chronic obstructive pulmonary disease) ([place])   . 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 GERD (gastroesophageal reflux disease)   . Rheumatoid arteritis   . Back pain   . Fracture of distal fibula 09/20/2010    Past Surgical History:  Procedure Laterality Date  . ABDOMINAL HYSTERECTOMY    . ABDOMINAL SURGERY     removed patial stomach from ulcer  . COLONOSCOPY  04-2001   internal hemorrhoids, panocolonic diverticulosis, and colon polyps   . UPPER GASTROINTESTINAL ENDOSCOPY      OB History    No data available       Home Medications    Prior to Admission medications   Medication Sig Start Date End Date Taking? Authorizing Provider  albuterol (PROAIR HFA) 108 (90 BASE) MCG/ACT inhaler Inhale 2 puffs into the lungs every 6 (six) hours as needed for wheezing or shortness of breath. For rescue     [provider]  apixaban (ELIQUIS) 2.5 MG TABS tablet Take 2.5 mg by mouth 2 (two) times daily.    [provider]  CARTIA XT 300 MG 24 hr capsule TAKE ONE CAPSULE BY MOUTH EVERY DAY 09/25/14   Arnoldo Lenis, MD  cephALEXin (KEFLEX) 500 MG capsule Take 1 capsule (500 mg total) by mouth 4 (four) times daily. 02/16/16   Milton Ferguson, MD  Cholecalciferol (VITAMIN D3) 1000 UNITS CAPS Take 1 capsule by mouth daily.    [provider]  cyanocobalamin 100 MCG tablet Take 100 mcg by mouth daily.    [provider]  diclofenac (FLECTOR) 1.3 % PTCH Place 1 patch onto the skin 2 (two) times daily. Patient not taking: Reported on 02/16/2016 10/11/14   Tanna Furry, MD  DULoxetine (CYMBALTA) 60 MG capsule Take 60 mg by mouth daily.      [provider]  ferrous gluconate (FERGON) 325 MG tablet Take 325 mg by mouth daily.     [provider]  Fluticasone-Salmeterol (ADVAIR DISKUS) 250-50 MCG/DOSE AEPB Inhale 1 puff into the lungs every 12 (twelve) hours.    [provider]  folic acid (FOLVITE) 1 MG tablet Take 1 mg by mouth daily.     [provider]  guaifenesin (ROBITUSSIN) 100 MG/5ML syrup Take 200 mg by mouth 3 (three) times daily as needed for cough.    [provider]  guaiFENesin-dextromethorphan (ROBITUSSIN DM) 100-10 MG/5ML syrup Take 5 mLs by mouth every 6 (six) hours. Patient not taking: Reported on 09/12/2014 07/29/13   Geradine Girt, DO  lidocaine (LIDODERM) 5 % Place 1 patch onto the skin daily. Remove & Discard patch within 12 hours or as directed by MD Patient not taking: Reported on 02/16/2016 10/11/14   Tanna Furry, MD  LORazepam (ATIVAN) 0.5 MG tablet Take 1 tablet (0.5 mg total) by mouth every 6 (six) hours as needed for anxiety. 07/29/13   Eulogio Bear U, DO  LYRICA 100 MG capsule Take 100 mg by mouth 3 (three) times daily. 12/07/15   [provider]  methotrexate (RHEUMATREX) 2.5 MG tablet Take 12.5 mg by mouth once a week. *Taken on Thursdays at 1200Caution:Chemotherapy. Protect from light.    [provider]  Multiple Vitamins-Minerals (MULTIVITAMIN WITH MINERALS) tablet Take 1 tablet by mouth daily.    [provider]  NUCYNTA ER 50 MG 12 hr tablet Take 50 mg by mouth 2 (two) times daily. 01/28/16   [provider]  oxyCODONE-acetaminophen (PERCOCET) 7.5-325 MG per tablet Take 1 tablet by mouth 4 (four) times daily as needed. 07/29/13   Geradine Girt, DO  pantoprazole (PROTONIX) 40 MG tablet Take 40 mg by mouth 2 (two) times daily.     [provider]  polyethylene glycol (MIRALAX / GLYCOLAX) packet Take 17 g by mouth daily as needed for mild constipation or moderate constipation.     [provider]  predniSONE (DELTASONE) 5 MG tablet Take 5 mg by mouth daily.      [provider]  pregabalin (LYRICA) 50 MG capsule Take 50 mg by mouth 2 (two) times daily.     [provider]  Probiotic Product (PROBIOTIC FORMULA PO) Take 1 tablet by mouth daily.     [provider]  raloxifene (EVISTA) 60 MG tablet Take 60 mg  by mouth daily.    [provider]  rosuvastatin (CRESTOR) 20 MG tablet Take 20 mg by mouth daily.     [provider]  vitamin C (ASCORBIC ACID) 500 MG tablet Take 500 mg by mouth daily.    [provider]    Family History Family History  Problem Relation Age of Onset  . Heart disease Father   . Kidney disease Mother        kidney cancer  . Colon cancer Daughter 79    Social History Social History  Substance Use Topics  .  Smoking status: Never Smoker  . Smokeless tobacco: Never Used  . Alcohol use No     Allergies   Other; Tape; Lipitor [atorvastatin calcium]; and Niaspan [niacin er]   Review of Systems Review of Systems  Gastrointestinal: Positive for diarrhea and vomiting.  All other systems reviewed and are negative.    Physical Exam Updated Vital Signs BP 121/65 (BP Location: Right Arm)   Pulse 82   Temp 97.9 F (36.6 C) (Oral)   Resp (!) 22   Ht 5\' 2"  (1.575 m)   Wt 215 lb (97.5 kg)   SpO2 92%   BMI 39.32 kg/m   Physical Exam  Nursing note and vitals reviewed.  83 year old female, resting comfortably and in no acute distress. Vital signs are significant for tachypnea. Oxygen saturation is 92%, which is normal. Head is normocephalic and atraumatic. PERRLA, EOMI. Oropharynx is clear. Neck is nontender and supple without adenopathy or JVD. Back is nontender and there is no CVA tenderness. Lungs are clear without rales, wheezes, or rhonchi. Chest is nontender. Heart has regular rate and rhythm without murmur. Abdomen is soft, flat, with moderate tenderness in the right mid and upper abdomen. There is no rebound or guarding. There are no masses or hepatosplenomegaly and peristalsis is hypoactive. Extremities have no cyanosis or edema, full range of motion is present. Skin is warm and dry without rash. Neurologic: She is awake, but slow to answer questions, Not oriented to place or time, cranial nerves are intact, there are no  motor or sensory deficits.  ED Treatments / Results  Labs (all labs ordered are listed, but only abnormal results are displayed) Labs Reviewed  COMPREHENSIVE METABOLIC PANEL - Abnormal; Notable for the following:       Result Value   Potassium 2.7 (*)    Glucose, Bld 145 (*)    Creatinine, Ser 1.55 (*)    Total Protein 5.8 (*)    Albumin 2.9 (*)    GFR calc non Af Amer 29 (*)    GFR calc Af Amer 34 (*)    All other components within normal limits  CBC WITH DIFFERENTIAL/PLATELET - Abnormal; Notable for the following:    WBC 12.1 (*)    Neutro Abs 7.9 (*)    Monocytes Absolute 1.6 (*)    All other components within normal limits  URINALYSIS, ROUTINE W REFLEX MICROSCOPIC - Abnormal; Notable for the following:    Color, Urine AMBER (*)    APPearance HAZY (*)    Protein, ur 30 (*)    Bacteria, UA RARE (*)    Squamous Epithelial / LPF 0-5 (*)    All other components within normal limits  GASTROINTESTINAL PANEL BY PCR, STOOL (REPLACES STOOL CULTURE)  LIPASE, BLOOD  CK  MAGNESIUM  I-STAT CG4 LACTIC ACID, ED  I-STAT TROPOININ, ED    EKG  EKG Interpretation  Date/Time:  Tuesday Jul 26 2016 04:09:43 EDT Ventricular Rate:  88 PR Interval:    QRS Duration: 112 QT Interval:  512 QTC Calculation: 602 R Axis:   -55 Text Interpretation:  Atrial fibrillation Borderline IVCD with LAD Inferior infarct, old Lateral leads are also involved Prolonged QT interval When compared with ECG of 02/16/2016, QT has lengthened Confirmed by Delora Fuel ([id number]) on 07/26/2016 4:24:02 AM       Radiology Ct Abdomen Pelvis Wo Contrast  Result Date: 07/26/2016 CLINICAL DATA:  83 year old female with nausea, vomiting and diarrhea for the past week. Initial encounter. EXAM: CT ABDOMEN AND  PELVIS WITHOUT CONTRAST TECHNIQUE: Multidetector CT imaging of the abdomen and pelvis was performed following the standard protocol without IV contrast. COMPARISON:  09/12/2014 abdominal series.  08/03/2009 PET-CT.  FINDINGS: Lower chest: Basilar subsegmental atelectasis. Cardiomegaly. Mitral and aortic valve calcifications. Hepatobiliary: Taking into account limitation by non contrast imaging, no worrisome hepatic lesion. Slightly lobulated contour mild fatty infiltration. No calcified gallstones. Pancreas: Taking into account limitation by non contrast imaging, no mass or focal inflammation. Spleen: Taking into account limitation by non contrast imaging no mass. Top-normal length. Adrenals/Urinary Tract: No obstruction. Right renal 1.5 cm cyst. 1 cm left angiomyelolipoma. No adrenal mass. Noncontrast filled views of the urinary bladder unremarkable. Stomach/Bowel: Post gastric bypass procedure. Diverticula throughout the colon most notable descending colon and sigmoid colon. Mild pericolonic inflammation portions of the transverse colon, descending colon and proximal sigmoid colon with a distribution more suspicious for colitis than diverticulitis without free intraperitoneal air or abscess collection. Evaluation of colon and stomach for detection of mass limited by degree of distention. Vascular/Lymphatic: Atherosclerotic changes aorta are and iliac artery is without aneurysmal dilation. No adenopathy. Reproductive: Post hysterectomy. Other: No bowel containing hernia. Musculoskeletal: Scoliosis with prominent degenerative changes of the lumbar spine. No obvious fracture or destructive lesion. IMPRESSION: Mild pericolonic inflammation portions of the transverse colon, descending colon and proximal sigmoid colon with a distribution more suspicious for colitis than diverticulitis without free intraperitoneal air or abscess collection. Post gastric bypass procedure. Cardiomegaly. Right renal cyst and small left angiomyolipoma. Scoliosis lumbar spine with prominent degenerative changes. Post hysterectomy. Electronically Signed   By: Genia Del M.D.   On: 07/26/2016 06:26   Dg Chest 1 View  Result Date: 07/26/2016 CLINICAL  DATA:  Weakness, nausea and vomiting EXAM: CHEST 1 VIEW COMPARISON:  Chest radiograph 10/11/2014 FINDINGS: Cardiomediastinal contours remain enlarged. There is calcific aortic atherosclerosis. There is central pulmonary vascular congestion without overt pulmonary edema. No pleural effusion or pneumothorax. IMPRESSION: Cardiomegaly and aortic atherosclerosis with pulmonary vascular congestion but no overt edema. Electronically Signed   By: Ulyses Jarred M.D.   On: 07/26/2016 06:23   Ct Head Wo Contrast  Result Date: 07/26/2016 CLINICAL DATA:  Fall, found on floor. EXAM: CT HEAD WITHOUT CONTRAST CT CERVICAL SPINE WITHOUT CONTRAST TECHNIQUE: Multidetector CT imaging of the head and cervical spine was performed following the standard protocol without intravenous contrast. Multiplanar CT image reconstructions of the cervical spine were also generated. COMPARISON:  Head CT 02/16/2016 head and cervical spine CT 09/12/2014 FINDINGS: CT HEAD FINDINGS Brain: No intracranial hemorrhage. No subdural or extra-axial fluid collection. No evidence of acute ischemia. Stable atrophy and chronic small vessel ischemic change from prior exam. No hydrocephalus, mass effect, or midline shift. Vascular: Atherosclerosis of skullbase vasculature without hyperdense vessel or abnormal calcification. Skull: No fracture or focal lesion. Sinuses/Orbits: Mastoid air cells are well-aerated. No fluid levels in the paranasal sinuses. Chronic opacification of left frontal sinus. Presumed bilateral cataract resection. Other: None. CT CERVICAL SPINE FINDINGS Alignment: There is minimal anterolisthesis of C3 on C4, C4 on C5, and C6 and C7. Minimal retrolisthesis of C5 on C6. This is unchanged from prior exam and appears degenerative. No jumped or perched facets. Lateral masses of C1 are well aligned on C2. Skull base and vertebrae: No acute fracture. The dens and skull base are intact. Soft tissues and spinal canal: No prevertebral fluid or swelling.  No visible canal hematoma. Disc levels: Multilevel disc space narrowing endplate spurring is most prominent at C4-C5 and C5-C6. Multilevel facet  arthropathy. Degenerative changes have mildly progressed at C4-C5 from prior. Upper chest: No acute abnormality. Other: None. IMPRESSION: 1. Atrophy and chronic small vessel ischemia. No acute intracranial abnormality. No skull fracture. 2. Multilevel degenerative change in the cervical spine without acute fracture or subluxation. Electronically Signed   By: Jeb Levering M.D.   On: 07/26/2016 06:18   Ct Cervical Spine Wo Contrast  Result Date: 07/26/2016 CLINICAL DATA:  Fall, found on floor. EXAM: CT HEAD WITHOUT CONTRAST CT CERVICAL SPINE WITHOUT CONTRAST TECHNIQUE: Multidetector CT imaging of the head and cervical spine was performed following the standard protocol without intravenous contrast. Multiplanar CT image reconstructions of the cervical spine were also generated. COMPARISON:  Head CT 02/16/2016 head and cervical spine CT 09/12/2014 FINDINGS: CT HEAD FINDINGS Brain: No intracranial hemorrhage. No subdural or extra-axial fluid collection. No evidence of acute ischemia. Stable atrophy and chronic small vessel ischemic change from prior exam. No hydrocephalus, mass effect, or midline shift. Vascular: Atherosclerosis of skullbase vasculature without hyperdense vessel or abnormal calcification. Skull: No fracture or focal lesion. Sinuses/Orbits: Mastoid air cells are well-aerated. No fluid levels in the paranasal sinuses. Chronic opacification of left frontal sinus. Presumed bilateral cataract resection. Other: None. CT CERVICAL SPINE FINDINGS Alignment: There is minimal anterolisthesis of C3 on C4, C4 on C5, and C6 and C7. Minimal retrolisthesis of C5 on C6. This is unchanged from prior exam and appears degenerative. No jumped or perched facets. Lateral masses of C1 are well aligned on C2. Skull base and vertebrae: No acute fracture. The dens and skull base  are intact. Soft tissues and spinal canal: No prevertebral fluid or swelling. No visible canal hematoma. Disc levels: Multilevel disc space narrowing endplate spurring is most prominent at C4-C5 and C5-C6. Multilevel facet arthropathy. Degenerative changes have mildly progressed at C4-C5 from prior. Upper chest: No acute abnormality. Other: None. IMPRESSION: 1. Atrophy and chronic small vessel ischemia. No acute intracranial abnormality. No skull fracture. 2. Multilevel degenerative change in the cervical spine without acute fracture or subluxation. Electronically Signed   By: Jeb Levering M.D.   On: 07/26/2016 06:18    Procedures Procedures (including critical care time)  Medications Ordered in ED Medications  iopamidol (ISOVUE-300) 61 % injection 100 mL (not administered)  potassium chloride 10 mEq in 100 mL IVPB (not administered)  potassium chloride 10 mEq in 100 mL IVPB (10 mEq Intravenous New Bag/Given 07/26/16 0558)  0.9 % NaCl with KCl 40 mEq / L  infusion (not administered)  ciprofloxacin (CIPRO) IVPB 400 mg (not administered)  metroNIDAZOLE (FLAGYL) IVPB 500 mg (not administered)  sodium chloride 0.9 % bolus 1,000 mL (0 mLs Intravenous Stopped 07/26/16 0526)  ondansetron (ZOFRAN) injection 4 mg (4 mg Intravenous Given 07/26/16 0449)  magnesium sulfate IVPB 2 g 50 mL (0 g Intravenous Stopped 07/26/16 0558)     Initial Impression / Assessment and Plan / ED Course  I have reviewed the triage vital signs and the nursing notes.  Pertinent labs & imaging results that were available during my care of the patient were reviewed by me and considered in my medical decision making (see chart for details).  Vomiting and diarrhea of. Apparent fall. Abdominal tenderness. Old records are reviewed, and she has prior ED visits for falls as well as a hospitalization for syncope. Given uncertainty about her mechanism of fall, CT will be obtained of head and cervical spine. With abdominal tenderness,  will send for CT of abdomen and pelvis. Old records to show prior atrial  fibrillation. She shows atrial fibrillation on monitor. We'll need to check ECG. Medication list includes apixaban. The patient is not sure of the medications that she is taking.  Laboratory workup shows severe hypokalemia as well as worsening renal function. Lactate is normal. CT does show evidence of colitis. Family members arrived and states patient has been more confused than normal. Family states symptoms started 5 days ago. She had eaten some remain lettuce that may have been from a doctor was contaminated. She is started on metronidazole and ciprofloxacin and asked stool sample sent for pathogen panel. CT of head and cervical spine and chest x-ray did not show acute process. Case is discussed with Dr. Olevia Bowens of [facility] who agrees to admit the patient.  CHA2DS2/VAS Stroke Risk Points      4 >= 2 Points: High Risk  1 - 1.99 Points: Medium Risk  0 Points: Low Risk    The previous score was 3 on 10/07/2015.:  Change:         Details    Note: External data might be a factor in metrics not marked with    Points Metrics   This score determines the patient's risk of having a stroke if the  patient has atrial fibrillation.       1 Has Congestive Heart Failure:  Yes   0 Has Vascular Disease:  No   0 Has Hypertension:  No   2 Age:  56   0 Has Diabetes:  No   0 Had Stroke:  No Had TIA:  No Had thromboembolism:  No   1 Female:  Yes          Final Clinical Impressions(s) / ED Diagnoses   Final diagnoses:  Nausea vomiting and diarrhea  Hypokalemia  Fall at home, initial encounter  Confusion  Acute kidney injury (nontraumatic) ([place])  Chronic atrial fibrillation ([place])  Chronic anticoagulation    New Prescriptions New Prescriptions   No medications on file     Delora Fuel, MD 21/30/86 [phone]

## 2016-07-26 NOTE — ED Triage Notes (Signed)
Pt arrived by EMS from home. Complaining of N/V/D for the past week. EMS stated pt was found in the floor. Pt also complaining of right shoulder pain.

## 2016-07-26 NOTE — Progress Notes (Signed)
FOLLOW UP NOTE FROM EARLIER ADMISSION TODAY:  83 yo female lives at home alone, with multiple medical problems including chronic afib on Eliquis, OCPD, depression, fibromyalgia, HTN, HLD, RA, PUD, admitted with 2 weeks of diarrhea, found to have hypokalemia with K of 2.7, AKI with cr of 1.5, normal Mag, Hypotensive, and CT showed colitis of the transverse, descending and prox sigmoid colon.  She was also found to have QTc of 602 ms, and was given supplemental Magnesium.   She was started on IV antibiotics, and her stool was sent for studies, and she was given gentle limited IVF, as he CXR showed some vascular congestion.  On exam, she appeared tired, but alert, orient, and answer questions appropriately.  She appears chronically ill. Her abdominal exam is benign.  She has no significant complaint of abdominal pain. We will continue with gentle IVF, continue with antibiotics, and follow up on stool studies.  If she is still having diarrhea, should consult GI tomorrow. She has no evidence of ischemic bowel or intestinal angina.  Replete K, and repeat EKG tomorrow for her QT prolongation to see if it corrects.   Her AKI, QTc, hypokalemia, can all be explained by her having diarrhea.  The exact etiology of her diarrhea is not certain but appears benign.  Orvan Falconer MD FACP. Hospitalist.

## 2016-07-26 NOTE — H&P (Signed)
History and Physical    [name] EXB:[id number] DOB: 1929/02/22 DOA: 07/26/2016  PCP: Celene Squibb, MD   Patient coming from: Home.  I have personally briefly reviewed patient's old medical records in [place]  Chief Complaint: Nausea vomiting and diarrhea.  HPI: [name] is a 83 y.o. female with medical history significant of anemia, chronic atrial fibrillation, chronic back pain, COPD, depression, diverticulosis, fatty liver, fibromyalgia, generalized anxiety disorder, GERD, peripheral neuropathy, hyperlipidemia, hypertension, hemorrhoids, rheumatoid arthritis, PUD, vitamin D deficiency who is brought to the emergency department via EMS with complaints of nausea vomiting and diarrhea for the past week. However, she told Dr. Roxanne Mins that she had the symptoms for 2 weeks instead of 1. EMS found her on the floor, the patient is altered and unable to provide further information at this time. She is only oriented to self.  ED Course: The patient received IV fluids, potassium supplementation, magnesium sulfate in the emergency department. WBC 12.1, hemoglobin 13.7 g/dL and platelets 181. Troponin level was 0.01 and EKG show atrial fibrillation with new prolongation of QT segment. Sodium 137, potassium 2.7, chloride 102, lactic acid 1.2 and bicarbonate 25 mmol/L. Lipase level was 19, total CK 39 and troponin 0.01 ng/mL. BUN 18, creatinine 1.55, glucose 145 and magnesium 2.0 mg/dL.  Imaging: chest radiograph showed cardiomegaly with mild vascular congestion, CT scan head, cervical spine did not show any acute abnormalities. CT scan abdomen/pelvis shows findings consistent with colitis. Please see full report for further detail.  Review of Systems: As per HPI otherwise 10 point review of systems negative.    Past Medical History:  Diagnosis Date  . Anemia   . Atrial fibrillation ([place])   . Back pain   . Bacterial pneumonia   . Cancer (HCC)    Skin   . Chronic  respiratory failure ([place])   . COPD (chronic obstructive pulmonary disease) ([place])   . Depression   . Diverticulosis   . Fatty liver   . Fibromyalgia   . Gastric polyp 09/08/11   at the anastomosis-inflammatory  . Generalized anxiety disorder   . GERD (gastroesophageal reflux disease)   . 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 Hypercholesteremia   . Hypertension   . Insomnia   . Internal hemorrhoids   . On home O2    qhs  . Rheumatoid arteritis   . Ulcer    stomach  . Vitamin D deficiency     Past Surgical History:  Procedure Laterality Date  . ABDOMINAL HYSTERECTOMY    . ABDOMINAL SURGERY     removed patial stomach from ulcer  . COLONOSCOPY  04-2001   internal hemorrhoids, panocolonic diverticulosis, and colon polyps   . UPPER GASTROINTESTINAL ENDOSCOPY       reports that she has never smoked. She has never used smokeless tobacco. She reports that she does not drink alcohol or use drugs.  Allergies  Allergen Reactions  . Other     Band Aids   . Tape   . Lipitor [Atorvastatin Calcium] Rash and Other (See Comments)    Muscle pain  . Niaspan [Niacin Er] Rash and Other (See Comments)    Muscle pain    Family History  Problem Relation Age of Onset  . Heart disease Father   . Kidney disease Mother        kidney cancer  . Colon cancer Daughter 66    Prior to Admission medications   Medication Sig Start Date End Date Taking? Authorizing  Provider  albuterol (PROAIR HFA) 108 (90 BASE) MCG/ACT inhaler Inhale 2 puffs into the lungs every 6 (six) hours as needed for wheezing or shortness of breath. For rescue     [provider]  apixaban (ELIQUIS) 2.5 MG TABS tablet Take 2.5 mg by mouth 2 (two) times daily.    [provider]  CARTIA XT 300 MG 24 hr capsule TAKE ONE CAPSULE BY MOUTH EVERY DAY 09/25/14   Arnoldo Lenis, MD  cephALEXin (KEFLEX) 500 MG capsule Take 1 capsule (500 mg total) by mouth 4 (four) times daily. 02/16/16   Milton Ferguson, MD    Cholecalciferol (VITAMIN D3) 1000 UNITS CAPS Take 1 capsule by mouth daily.    [provider]  cyanocobalamin 100 MCG tablet Take 100 mcg by mouth daily.    [provider]  diclofenac (FLECTOR) 1.3 % PTCH Place 1 patch onto the skin 2 (two) times daily. Patient not taking: Reported on 02/16/2016 10/11/14   Tanna Furry, MD  DULoxetine (CYMBALTA) 60 MG capsule Take 60 mg by mouth daily.      [provider]  ferrous gluconate (FERGON) 325 MG tablet Take 325 mg by mouth daily.     [provider]  Fluticasone-Salmeterol (ADVAIR DISKUS) 250-50 MCG/DOSE AEPB Inhale 1 puff into the lungs every 12 (twelve) hours.    [provider]  folic acid (FOLVITE) 1 MG tablet Take 1 mg by mouth daily.    [provider]  guaifenesin (ROBITUSSIN) 100 MG/5ML syrup Take 200 mg by mouth 3 (three) times daily as needed for cough.    [provider]  guaiFENesin-dextromethorphan (ROBITUSSIN DM) 100-10 MG/5ML syrup Take 5 mLs by mouth every 6 (six) hours. Patient not taking: Reported on 09/12/2014 07/29/13   Geradine Girt, DO  lidocaine (LIDODERM) 5 % Place 1 patch onto the skin daily. Remove & Discard patch within 12 hours or as directed by MD Patient not taking: Reported on 02/16/2016 10/11/14   Tanna Furry, MD  LORazepam (ATIVAN) 0.5 MG tablet Take 1 tablet (0.5 mg total) by mouth every 6 (six) hours as needed for anxiety. 07/29/13   Eulogio Bear U, DO  LYRICA 100 MG capsule Take 100 mg by mouth 3 (three) times daily. 12/07/15   [provider]  methotrexate (RHEUMATREX) 2.5 MG tablet Take 12.5 mg by mouth once a week. *Taken on Thursdays at 1200Caution:Chemotherapy. Protect from light.    [provider]  Multiple Vitamins-Minerals (MULTIVITAMIN WITH MINERALS) tablet Take 1 tablet by mouth daily.    [provider]  NUCYNTA ER 50 MG 12 hr tablet Take 50 mg by mouth 2 (two) times daily. 01/28/16   [provider]   oxyCODONE-acetaminophen (PERCOCET) 7.5-325 MG per tablet Take 1 tablet by mouth 4 (four) times daily as needed. 07/29/13   Geradine Girt, DO  pantoprazole (PROTONIX) 40 MG tablet Take 40 mg by mouth 2 (two) times daily.     [provider]  polyethylene glycol (MIRALAX / GLYCOLAX) packet Take 17 g by mouth daily as needed for mild constipation or moderate constipation.     [provider]  predniSONE (DELTASONE) 5 MG tablet Take 5 mg by mouth daily.      [provider]  pregabalin (LYRICA) 50 MG capsule Take 50 mg by mouth 2 (two) times daily.     [provider]  Probiotic Product (PROBIOTIC FORMULA PO) Take 1 tablet by mouth daily.     [provider]  raloxifene (EVISTA) 60 MG tablet Take 60 mg by mouth daily.    [provider]  rosuvastatin (CRESTOR) 20 MG tablet Take 20 mg by mouth daily.     [provider]  vitamin C (ASCORBIC ACID) 500 MG tablet Take 500 mg by mouth daily.    [provider]    Physical Exam: Vitals:   07/26/16 0356 07/26/16 0402  BP:  121/65  Pulse:  82  Resp:  (!) 22  Temp:  97.9 F (36.6 C)  TempSrc:  Oral  SpO2:  92%  Weight: 97.5 kg (215 lb)   Height: 5\' 2"  (1.575 m)     Constitutional: NAD, calm, comfortable Eyes: PERRL, lids and conjunctivae are mildly injected  ENMT: Mucous membranes are mildly dry. Posterior pharynx clear of any exudate or lesions. Absent  dentition.  Neck: normal, supple, no masses, no thyromegaly Respiratory: clear to auscultation bilaterally, no wheezing, no crackles. Normal respiratory effort. No accessory muscle use.  Cardiovascular:Irregularly irregular, no murmurs / rubs / gallops. No extremity edema. 2+ pedal pulses. No carotid bruits.  Abdomen:Soft, mild epigastric  tenderness, no  guarding/rebound/masses palpated. No hepatosplenomegaly. Bowel sounds positive.  Musculoskeletal: no clubbing / cyanosis. Good ROM, no contractures. Normal muscle tone.   Skin: no rashes, lesions, ulcers on limited skin exam. Unable to evaluate back.  Neurologic: CN 2-12 grossly intact.Moves all extremities. Unable to fully evaluate.  Psychiatric:Confused, only oriented to name.   Labs on Admission: I have personally reviewed following labs and imaging studies  CBC:  Recent Labs Lab 07/26/16 0421  WBC 12.1*  NEUTROABS 7.9*  HGB 13.7  HCT 40.9  MCV 92.1  PLT 562   Basic Metabolic Panel:  Recent Labs Lab 07/26/16 0421  NA 137  K 2.7*  CL 102  CO2 25  GLUCOSE 145*  BUN 18  CREATININE 1.55*  CALCIUM 9.2  MG 2.0   GFR: Estimated Creatinine Clearance: 28.4 mL/min (A) (by C-G formula based on SCr of 1.55 mg/dL (H)). Liver Function Tests:  Recent Labs Lab 07/26/16 0421  AST 23  ALT 18  ALKPHOS 57  BILITOT 0.8  PROT 5.8*  ALBUMIN 2.9*    Recent Labs Lab 07/26/16 0421  LIPASE 19   No results for input(s): AMMONIA in the last 168 hours. Coagulation Profile: No results for input(s): INR, PROTIME in the last 168 hours. Cardiac Enzymes:  Recent Labs Lab 07/26/16 0421  CKTOTAL 39   BNP (last 3 results) No results for input(s): PROBNP in the last 8760 hours. HbA1C: No results for input(s): HGBA1C in the last 72 hours. CBG: No results for input(s): GLUCAP in the last 168 hours. Lipid Profile: No results for input(s): CHOL, HDL, LDLCALC, TRIG, CHOLHDL, LDLDIRECT in the last 72 hours. Thyroid Function Tests: No results for input(s): TSH, T4TOTAL, FREET4, T3FREE, THYROIDAB in the last 72 hours. Anemia Panel: No results for input(s): VITAMINB12, FOLATE, FERRITIN, TIBC, IRON, RETICCTPCT in the last 72 hours. Urine analysis:    Component Value Date/Time   COLORURINE AMBER (A) 07/26/2016 0413   APPEARANCEUR HAZY (A) 07/26/2016 0413   LABSPEC 1.018 07/26/2016 0413   PHURINE 5.0 07/26/2016 0413   GLUCOSEU NEGATIVE 07/26/2016 0413   HGBUR NEGATIVE 07/26/2016 0413   BILIRUBINUR NEGATIVE 07/26/2016 0413   KETONESUR NEGATIVE  07/26/2016 0413   PROTEINUR 30 (A) 07/26/2016 0413   UROBILINOGEN 1.0 10/11/2014 1428   NITRITE NEGATIVE 07/26/2016 0413   LEUKOCYTESUR NEGATIVE 07/26/2016 0413    Radiological Exams on Admission: Ct Abdomen Pelvis  Wo Contrast  Result Date: 07/26/2016 CLINICAL DATA:  83 year old female with nausea, vomiting and diarrhea for the past week. Initial encounter. EXAM: CT ABDOMEN AND PELVIS WITHOUT CONTRAST TECHNIQUE: Multidetector CT imaging of the abdomen and pelvis was performed following the standard protocol without IV contrast. COMPARISON:  09/12/2014 abdominal series.  08/03/2009 PET-CT. FINDINGS: Lower chest: Basilar subsegmental atelectasis. Cardiomegaly. Mitral and aortic valve calcifications. Hepatobiliary: Taking into account limitation by non contrast imaging, no worrisome hepatic lesion. Slightly lobulated contour mild fatty infiltration. No calcified gallstones. Pancreas: Taking into account limitation by non contrast imaging, no mass or focal inflammation. Spleen: Taking into account limitation by non contrast imaging no mass. Top-normal length. Adrenals/Urinary Tract: No obstruction. Right renal 1.5 cm cyst. 1 cm left angiomyelolipoma. No adrenal mass. Noncontrast filled views of the urinary bladder unremarkable. Stomach/Bowel: Post gastric bypass procedure. Diverticula throughout the colon most notable descending colon and sigmoid colon. Mild pericolonic inflammation portions of the transverse colon, descending colon and proximal sigmoid colon with a distribution more suspicious for colitis than diverticulitis without free intraperitoneal air or abscess collection. Evaluation of colon and stomach for detection of mass limited by degree of distention. Vascular/Lymphatic: Atherosclerotic changes aorta are and iliac artery is without aneurysmal dilation. No adenopathy. Reproductive: Post hysterectomy. Other: No bowel containing hernia. Musculoskeletal: Scoliosis with prominent degenerative changes  of the lumbar spine. No obvious fracture or destructive lesion. IMPRESSION: Mild pericolonic inflammation portions of the transverse colon, descending colon and proximal sigmoid colon with a distribution more suspicious for colitis than diverticulitis without free intraperitoneal air or abscess collection. Post gastric bypass procedure. Cardiomegaly. Right renal cyst and small left angiomyolipoma. Scoliosis lumbar spine with prominent degenerative changes. Post hysterectomy. Electronically Signed   By: Genia Del M.D.   On: 07/26/2016 06:26   Dg Chest 1 View  Result Date: 07/26/2016 CLINICAL DATA:  Weakness, nausea and vomiting EXAM: CHEST 1 VIEW COMPARISON:  Chest radiograph 10/11/2014 FINDINGS: Cardiomediastinal contours remain enlarged. There is calcific aortic atherosclerosis. There is central pulmonary vascular congestion without overt pulmonary edema. No pleural effusion or pneumothorax. IMPRESSION: Cardiomegaly and aortic atherosclerosis with pulmonary vascular congestion but no overt edema. Electronically Signed   By: Ulyses Jarred M.D.   On: 07/26/2016 06:23   Ct Head Wo Contrast  Result Date: 07/26/2016 CLINICAL DATA:  Fall, found on floor. EXAM: CT HEAD WITHOUT CONTRAST CT CERVICAL SPINE WITHOUT CONTRAST TECHNIQUE: Multidetector CT imaging of the head and cervical spine was performed following the standard protocol without intravenous contrast. Multiplanar CT image reconstructions of the cervical spine were also generated. COMPARISON:  Head CT 02/16/2016 head and cervical spine CT 09/12/2014 FINDINGS: CT HEAD FINDINGS Brain: No intracranial hemorrhage. No subdural or extra-axial fluid collection. No evidence of acute ischemia. Stable atrophy and chronic small vessel ischemic change from prior exam. No hydrocephalus, mass effect, or midline shift. Vascular: Atherosclerosis of skullbase vasculature without hyperdense vessel or abnormal calcification. Skull: No fracture or focal lesion.  Sinuses/Orbits: Mastoid air cells are well-aerated. No fluid levels in the paranasal sinuses. Chronic opacification of left frontal sinus. Presumed bilateral cataract resection. Other: None. CT CERVICAL SPINE FINDINGS Alignment: There is minimal anterolisthesis of C3 on C4, C4 on C5, and C6 and C7. Minimal retrolisthesis of C5 on C6. This is unchanged from prior exam and appears degenerative. No jumped or perched facets. Lateral masses of C1 are well aligned on C2. Skull base and vertebrae: No acute fracture. The dens and skull base are intact. Soft tissues and spinal canal: No  prevertebral fluid or swelling. No visible canal hematoma. Disc levels: Multilevel disc space narrowing endplate spurring is most prominent at C4-C5 and C5-C6. Multilevel facet arthropathy. Degenerative changes have mildly progressed at C4-C5 from prior. Upper chest: No acute abnormality. Other: None. IMPRESSION: 1. Atrophy and chronic small vessel ischemia. No acute intracranial abnormality. No skull fracture. 2. Multilevel degenerative change in the cervical spine without acute fracture or subluxation. Electronically Signed   By: Jeb Levering M.D.   On: 07/26/2016 06:18   Ct Cervical Spine Wo Contrast  Result Date: 07/26/2016 CLINICAL DATA:  Fall, found on floor. EXAM: CT HEAD WITHOUT CONTRAST CT CERVICAL SPINE WITHOUT CONTRAST TECHNIQUE: Multidetector CT imaging of the head and cervical spine was performed following the standard protocol without intravenous contrast. Multiplanar CT image reconstructions of the cervical spine were also generated. COMPARISON:  Head CT 02/16/2016 head and cervical spine CT 09/12/2014 FINDINGS: CT HEAD FINDINGS Brain: No intracranial hemorrhage. No subdural or extra-axial fluid collection. No evidence of acute ischemia. Stable atrophy and chronic small vessel ischemic change from prior exam. No hydrocephalus, mass effect, or midline shift. Vascular: Atherosclerosis of skullbase vasculature without  hyperdense vessel or abnormal calcification. Skull: No fracture or focal lesion. Sinuses/Orbits: Mastoid air cells are well-aerated. No fluid levels in the paranasal sinuses. Chronic opacification of left frontal sinus. Presumed bilateral cataract resection. Other: None. CT CERVICAL SPINE FINDINGS Alignment: There is minimal anterolisthesis of C3 on C4, C4 on C5, and C6 and C7. Minimal retrolisthesis of C5 on C6. This is unchanged from prior exam and appears degenerative. No jumped or perched facets. Lateral masses of C1 are well aligned on C2. Skull base and vertebrae: No acute fracture. The dens and skull base are intact. Soft tissues and spinal canal: No prevertebral fluid or swelling. No visible canal hematoma. Disc levels: Multilevel disc space narrowing endplate spurring is most prominent at C4-C5 and C5-C6. Multilevel facet arthropathy. Degenerative changes have mildly progressed at C4-C5 from prior. Upper chest: No acute abnormality. Other: None. IMPRESSION: 1. Atrophy and chronic small vessel ischemia. No acute intracranial abnormality. No skull fracture. 2. Multilevel degenerative change in the cervical spine without acute fracture or subluxation. Electronically Signed   By: Jeb Levering M.D.   On: 07/26/2016 06:18    EKG: Independently reviewed. Vent. rate 88 BPM PR interval * ms QRS duration 112 ms QT/QTc 512/602 ms P-R-T axes * -55 123 Atrial fibrillation Borderline IVCD with LAD Inferior infarct, old Lateral leads are also involved Prolonged QT interval There is QT prolongation when compared to previous EKG.  Assessment/Plan Principal Problem:   AKI (acute kidney injury) ([place]) Admit to telemetry/inpatient. Continue gentle and time-limited IV hydration. Replace electrolytes. Monitor intake and output. Follow-up renal function and electrolytes. Consider nephrology evaluation if no improvement.  Active Problems:   Acute gastroenteritis Continue IV fluids. Follow-up gastric  panel by PCR and stool. Consider IV antibiotics depending on results.    Prolonged QT interval. Receive magnesium sulfate in the emergency department. Avoid medications that would worsen condition. Monitor electrolytes.      GERD (gastroesophageal reflux disease) Famotidine 20 mg IVP every 12 hours.    Hypokalemia Replacing. Follow-up potassium level.    Atrial fibrillation (HCC) CHA2DS2-VASc Score of at least 5. Resume Cartia XT 300 mg by mouth daily once tolerating oral intake.    Hypertension Hold antihypertensives for now, since the patient is volume depleted.    COPD (chronic obstructive pulmonary disease) (HCC) Supplemental oxygen and bronchodilators as needed.  Altered mental status CT scan Brain Was Negative. Likely secondary to enteritis and volume depletion    DVT prophylaxis: On Eliquis. Code Status: Full code. Family Communication:  Disposition Plan: Admit for IV hydration, electrolyte replacement and IV antibiotics. Consults called:  Admission status: Telemetry/inpatient   Reubin Milan MD [facility] Pager [phone].  If 7PM-7AM, please contact night-coverage [email or web address] Password [facility]  07/26/2016, 6:43 AM

## 2016-07-27 ENCOUNTER — Inpatient Hospital Stay (HOSPITAL_COMMUNITY): Payer: Medicare Other

## 2016-07-27 DIAGNOSIS — I360 Nonrheumatic tricuspid (valve) stenosis: Secondary | ICD-10-CM

## 2016-07-27 DIAGNOSIS — N179 Acute kidney failure, unspecified: Principal | ICD-10-CM

## 2016-07-27 DIAGNOSIS — Z7901 Long term (current) use of anticoagulants: Secondary | ICD-10-CM

## 2016-07-27 DIAGNOSIS — K529 Noninfective gastroenteritis and colitis, unspecified: Secondary | ICD-10-CM

## 2016-07-27 DIAGNOSIS — E876 Hypokalemia: Secondary | ICD-10-CM

## 2016-07-27 DIAGNOSIS — R410 Disorientation, unspecified: Secondary | ICD-10-CM

## 2016-07-27 DIAGNOSIS — R112 Nausea with vomiting, unspecified: Secondary | ICD-10-CM

## 2016-07-27 DIAGNOSIS — I482 Chronic atrial fibrillation: Secondary | ICD-10-CM

## 2016-07-27 DIAGNOSIS — R197 Diarrhea, unspecified: Secondary | ICD-10-CM

## 2016-07-27 DIAGNOSIS — W19XXXA Unspecified fall, initial encounter: Secondary | ICD-10-CM

## 2016-07-27 DIAGNOSIS — R9431 Abnormal electrocardiogram [ECG] [EKG]: Secondary | ICD-10-CM

## 2016-07-27 LAB — CBC
HEMATOCRIT: 39.8 % (ref 36.0–46.0)
Hemoglobin: 12.9 g/dL (ref 12.0–15.0)
MCH: 30.4 pg (ref 26.0–34.0)
MCHC: 32.4 g/dL (ref 30.0–36.0)
MCV: 93.6 fL (ref 78.0–100.0)
Platelets: 147 10*3/uL — ABNORMAL LOW (ref 150–400)
RBC: 4.25 MIL/uL (ref 3.87–5.11)
RDW: 15.9 % — AB (ref 11.5–15.5)
WBC: 7.3 10*3/uL (ref 4.0–10.5)

## 2016-07-27 LAB — COMPREHENSIVE METABOLIC PANEL
ALBUMIN: 2.5 g/dL — AB (ref 3.5–5.0)
ALK PHOS: 52 U/L (ref 38–126)
ALT: 15 U/L (ref 14–54)
AST: 20 U/L (ref 15–41)
Anion gap: 7 (ref 5–15)
BILIRUBIN TOTAL: 0.6 mg/dL (ref 0.3–1.2)
BUN: 12 mg/dL (ref 6–20)
CALCIUM: 8.7 mg/dL — AB (ref 8.9–10.3)
CO2: 25 mmol/L (ref 22–32)
CREATININE: 1.23 mg/dL — AB (ref 0.44–1.00)
Chloride: 108 mmol/L (ref 101–111)
GFR calc Af Amer: 45 mL/min — ABNORMAL LOW (ref >60)
GFR, EST NON AFRICAN AMERICAN: 39 mL/min — AB (ref >60)
GLUCOSE: 118 mg/dL — AB (ref 65–99)
Potassium: 3.1 mmol/L — ABNORMAL LOW (ref 3.5–5.1)
Sodium: 140 mmol/L (ref 135–145)
TOTAL PROTEIN: 5.3 g/dL — AB (ref 6.5–8.1)

## 2016-07-27 LAB — GASTROINTESTINAL PANEL BY PCR, STOOL (REPLACES STOOL CULTURE)
ADENOVIRUS F40/41: NOT DETECTED
Astrovirus: NOT DETECTED
CRYPTOSPORIDIUM: NOT DETECTED
Campylobacter species: NOT DETECTED
Cyclospora cayetanensis: NOT DETECTED
ENTEROAGGREGATIVE E COLI (EAEC): NOT DETECTED
ENTEROPATHOGENIC E COLI (EPEC): NOT DETECTED
Entamoeba histolytica: NOT DETECTED
Enterotoxigenic E coli (ETEC): NOT DETECTED
GIARDIA LAMBLIA: NOT DETECTED
NOROVIRUS GI/GII: NOT DETECTED
Plesimonas shigelloides: NOT DETECTED
ROTAVIRUS A: NOT DETECTED
Salmonella species: NOT DETECTED
Sapovirus (I, II, IV, and V): NOT DETECTED
Shiga like toxin producing E coli (STEC): NOT DETECTED
Shigella/Enteroinvasive E coli (EIEC): NOT DETECTED
Vibrio cholerae: NOT DETECTED
Vibrio species: NOT DETECTED
YERSINIA ENTEROCOLITICA: NOT DETECTED

## 2016-07-27 LAB — C DIFFICILE QUICK SCREEN W PCR REFLEX
C DIFFICILE (CDIFF) TOXIN: NEGATIVE
C Diff antigen: NEGATIVE
C Diff interpretation: NOT DETECTED

## 2016-07-27 MED ORDER — POTASSIUM CHLORIDE CRYS ER 20 MEQ PO TBCR
20.0000 meq | EXTENDED_RELEASE_TABLET | Freq: Two times a day (BID) | ORAL | Status: DC
  Administered 2016-07-27 – 2016-07-28 (×3): 20 meq via ORAL
  Filled 2016-07-27 (×3): qty 1

## 2016-07-27 NOTE — Evaluation (Signed)
Physical Therapy Evaluation Patient Details Name: [name] MRN: [id number] DOB: 1929/01/26 Today's Date: 07/27/2016   History of Present Illness   83 y.o. female with medical history significant of anemia, chronic atrial fibrillation, chronic back pain, COPD, depression, diverticulosis, fatty liver, fibromyalgia, generalized anxiety disorder, GERD, peripheral neuropathy, hyperlipidemia, hypertension, hemorrhoids, rheumatoid arthritis, PUD, vitamin D deficiency who is brought to the emergency department via EMS with complaints of nausea vomiting and diarrhea for the past week. However, she told Dr. Roxanne Mins that she had the symptoms for 2 weeks instead of 1. EMS found her on the floor, the patient is altered and unable to provide further information at this time.   chest radiograph showed cardiomegaly with mild vascular congestion, CT scan head, cervical spine did not show any acute abnormalities. CT scan abdomen/pelvis shows findings consistent with colitis.  Clinical Impression  Pt received in bed, and was agreeable to PT evaluation.  Pt expressed that she normally is independent with transfers bed<>w/c, and w/c<>commode at home.  She is able to mobilize independently using UE's to propel the w/c.  She has had multiple falls in the past 6 months.  During today's PT evaluation, she required Min A for SPT bed<>BSC, and BSC<>chair.  She was very fatigued with this transfer.  She is recommended for SNF at this time to increase her strength, endurance, and optimize her return to PLOF.     Follow Up Recommendations SNF    Equipment Recommendations  None recommended by PT    Recommendations for Other Services       Precautions / Restrictions Precautions Precautions: Fall Precaution Comments: Pt reports that she has had multiple falls, but cannot tell me how many.  Dtr confirms this and states that she has never allowed them to take her to the hospital. Pt states she was trying to get onto the  bathroom commode and when she stood, her foot was on something, and she slid and got caught between the commode and wall.  Restrictions Weight Bearing Restrictions: No      Mobility  Bed Mobility Overal bed mobility: Needs Assistance Bed Mobility: Supine to Sit     Supine to sit: Supervision;HOB elevated        Transfers Overall transfer level: Needs assistance Equipment used: None Transfers: Stand Pivot Transfers   Stand pivot transfers: Min assist       General transfer comment: Upon standing, pt becomes incontinent of stool.  She states she normally wears a brief at home.  She was able to transfer onto the [facility] where she finished having her BM.  Pt requires assistance to for hygiene. She then required assistance for transferring BSC<>chair.   Ambulation/Gait Ambulation/Gait assistance:  (NA - non-ambulatory at baseline. )              Stairs            Wheelchair Mobility    Modified Rankin (Stroke Patients Only)       Balance Overall balance assessment: History of Falls;Needs assistance Sitting-balance support: Bilateral upper extremity supported;Feet supported Sitting balance-Leahy Scale: Good     Standing balance support: Bilateral upper extremity supported Standing balance-Leahy Scale: Fair                               Pertinent Vitals/Pain Pain Assessment: No/denies pain    Home Living   Living Arrangements: Alone Available Help at Discharge: Personal care attendant (Pt states  that she has an aide that comes on a daily basis from 8am to 2pm.   She also has a life alert button. ) Type of Home: House Home Access: Level entry     Home Layout: One level Home Equipment: Bedside commode;Shower seat;Hospital bed;Walker - 4 wheels;Other (comment) (O2)      Prior Function     Gait / Transfers Assistance Needed: Pt is not ambulatory, and mobilizes in her w/c.  she is able to self propel using UE's.  She performs stand pivot  transfers independently to get into her w/c or into the bathroom on the commode.  Pt states she does not use the [facility] because her aide will not empty it.   ADL's / Homemaking Assistance Needed: Aide to assist with cooking, cleaning, laundry, grocery shopping, and bathing.  Pt is independent with dressing.         Hand Dominance        Extremity/Trunk Assessment   Upper Extremity Assessment Upper Extremity Assessment: Generalized weakness    Lower Extremity Assessment Lower Extremity Assessment: Generalized weakness       Communication   Communication: HOH  Cognition Arousal/Alertness: Awake/alert Behavior During Therapy: WFL for tasks assessed/performed Overall Cognitive Status: Within Functional Limits for tasks assessed                                 General Comments: Some forgetfulness noted - states that she only wears O2 at night, but dtr confirms she wears it all the time.       General Comments      Exercises     Assessment/Plan    PT Assessment Patient needs continued PT services  PT Problem List Decreased strength;Decreased activity tolerance;Decreased balance;Decreased mobility;Obesity       PT Treatment Interventions DME instruction;Functional mobility training;Therapeutic activities;Therapeutic exercise;Balance training;Patient/family education;Wheelchair mobility training    PT Goals (Current goals can be found in the Care Plan section)  Acute Rehab PT Goals Patient Stated Goal: To get stronger  PT Goal Formulation: With patient/family Time For Goal Achievement: 08/10/16 Potential to Achieve Goals: Fair    Frequency Min 3X/week   Barriers to discharge Decreased caregiver support Pt lives alone, and has an aide during the day, but will need 24/7    Co-evaluation               AM-PAC PT "6 Clicks" Daily Activity  Outcome Measure Difficulty turning over in bed (including adjusting bedclothes, sheets and blankets)?:  None Difficulty moving from lying on back to sitting on the side of the bed? : A Little Difficulty sitting down on and standing up from a chair with arms (e.g., wheelchair, bedside commode, etc,.)?: A Little Help needed moving to and from a bed to chair (including a wheelchair)?: A Little Help needed walking in hospital room?: A Lot Help needed climbing 3-5 steps with a railing? : Total 6 Click Score: 16    End of Session Equipment Utilized During Treatment: Gait belt;Oxygen Activity Tolerance: Patient limited by fatigue Patient left: in chair;with call bell/phone within reach;with family/visitor present Nurse Communication: Mobility status (mobility sheet left up in the room, RN present) PT Visit Diagnosis: Unsteadiness on feet (R26.81);History of falling (Z91.81);Muscle weakness (generalized) (M62.81);Repeated falls (R29.6);Other abnormalities of gait and mobility (R26.89)    Time: 8413-2440 PT Time Calculation (min) (ACUTE ONLY): 43 min   Charges:   PT Evaluation $PT Eval Low Complexity: 1  Procedure PT Treatments $Therapeutic Activity: 23-37 mins   PT G Codes:   PT G-Codes **NOT FOR INPATIENT CLASS** Functional Assessment Tool Used: AM-PAC 6 Clicks Basic Mobility;Clinical judgement Functional Limitation: Mobility: Walking and moving around Mobility: Walking and Moving Around Current Status (T7322): At least 40 percent but less than 60 percent impaired, limited or restricted Mobility: Walking and Moving Around Goal Status [phone]): At least 20 percent but less than 40 percent impaired, limited or restricted    Beth Scharlene Catalina, PT, DPT X: [phone]

## 2016-07-27 NOTE — NC FL2 (Signed)
[place] MEDICAID FL2 LEVEL OF CARE SCREENING TOOL     IDENTIFICATION  Patient Name: [name] Birthdate: 11-Dec-1928 Sex: female Admission Date (Current Location): 07/26/2016  [facility] and [place] Number:  [name] [id number] Jenetta Downer ([id number]) Facility and Address:  [facility] [address] [place]      Provider Number: [id number]  Attending Physician Name and Address:  Eber Jones, MD  Relative Name and Phone Number:       Current Level of Care: Hospital Recommended Level of Care: [place] Prior Approval Number:    Date Approved/Denied:   PASRR Number: [id number]  Discharge Plan: SNF    Current Diagnoses: Patient Active Problem List   Diagnosis Date Noted  . AKI (acute kidney injury) ([place]) 07/26/2016  . Atrial fibrillation ([place]) 07/26/2016  . Hypertension 07/26/2016  . Acute gastroenteritis 07/26/2016  . Altered mental status 07/26/2016  . Prolonged QT interval 07/26/2016  . Syncope 09/12/2014  . CKD (chronic kidney disease) 09/12/2014  . Hyperglycemia 09/12/2014  . Hypoxia 09/12/2014  . Acute encephalopathy 09/12/2014  . Chest pain 07/21/2013  . Chronic respiratory failure ([place]) 07/21/2013  . Hypokalemia 07/21/2013  . Atrial fibrillation with RVR ([place]) 07/21/2013  . Knee pain 05/21/2013  . Fibromyalgia   . COPD (chronic obstructive pulmonary disease) ([place])   . 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 GERD (gastroesophageal reflux disease)   . Rheumatoid arteritis   . Back pain   . Fracture of distal fibula 09/20/2010    Orientation RESPIRATION BLADDER Height & Weight     Self, Time, Situation, Place  O2 (2L) Incontinent Weight: 215 lb (97.5 kg) Height:  5\' 2"  (157.5 cm)  BEHAVIORAL SYMPTOMS/MOOD NEUROLOGICAL BOWEL NUTRITION STATUS      Incontinent  (see discharge summary)  AMBULATORY STATUS COMMUNICATION OF NEEDS Skin   Limited Assist Verbally Normal                        Personal Care Assistance Level of Assistance  Bathing, Feeding, Dressing Bathing Assistance: Limited assistance Feeding assistance: Independent Dressing Assistance: Limited assistance     Functional Limitations Info  Sight, Hearing, Speech Sight Info: Adequate Hearing Info: Adequate Speech Info: Adequate    SPECIAL CARE FACTORS FREQUENCY  PT (By licensed PT)     PT Frequency: 5x/week              Contractures Contractures Info: Not present    Additional Factors Info  Code Status, Allergies, Psychotropic, Isolation Precautions Code Status Info: Full Code Allergies Info: Other, tape, Lipitor, Niaspan Psychotropic Info: Cymbalta, Ativan   Isolation Precautions Info: Enteric precautions     Current Medications (07/27/2016):  This is the current hospital active medication list Current Facility-Administered Medications  Medication Dose Route Frequency Provider Last Rate Last Dose  . famotidine (PEPCID) IVPB 20 mg premix  20 mg Intravenous Q24H Reubin Milan, MD   Stopped at 07/26/16 1140  . iopamidol (ISOVUE-300) 61 % injection 100 mL  100 mL Intravenous Once PRN Delora Fuel, MD      . potassium chloride SA (K-DUR,KLOR-CON) CR tablet 20 mEq  20 mEq Oral BID Eber Jones, MD      . prochlorperazine (COMPAZINE) injection 5 mg  5 mg Intravenous Q4H PRN Reubin Milan, MD         Discharge Medications: Please see discharge summary for a list of discharge medications.  Relevant Imaging Results:  Relevant Lab Results:   Additional  Information SSN [id number] [place], [place]

## 2016-07-27 NOTE — Clinical Social Work Placement (Signed)
   CLINICAL SOCIAL WORK PLACEMENT  NOTE  Date:  07/27/2016  Patient Details  Name: [name] MRN: [id number] Date of Birth: November 09, 1928  Clinical Social Work is seeking post-discharge placement for this patient at the [place] level of care (*CSW will initial, date and re-position this form in  chart as items are completed):  Yes   Patient/family provided with [place] Work Department's list of facilities offering this level of care within the geographic area requested by the patient (or if unable, by the patient's family).  Yes   Patient/family informed of their freedom to choose among providers that offer the needed level of care, that participate in Medicare, Medicaid or managed care program needed by the patient, have an available bed and are willing to accept the patient.  Yes   Patient/family informed of [facility]'s ownership interest in [facility] and [facility], as well as of the fact that they are under no obligation to receive care at these facilities.  PASRR submitted to EDS on       PASRR number received on       Existing PASRR number confirmed on 07/27/16     FL2 transmitted to all facilities in geographic area requested by pt/family on 07/27/16     FL2 transmitted to all facilities within larger geographic area on       Patient informed that his/her managed care company has contracts with or will negotiate with certain facilities, including the following:        Yes   Patient/family informed of bed offers received.  Patient chooses bed at [facility]     Physician recommends and patient chooses bed at      Patient to be transferred to   on  .  Patient to be transferred to facility by       Patient family notified on   of transfer.  Name of family member notified:        PHYSICIAN       Additional Comment:    _______________________________________________ Ihor Gully, LCSW 07/27/2016,  2:34 PM

## 2016-07-27 NOTE — Clinical Social Work Note (Signed)
Clinical Social Work Assessment  Patient Details  Name: [name] MRN: [id number] Date of Birth: [birth year]  Date of referral:  07/27/16               Reason for consult:  Discharge Planning                Permission sought to share information with:    Permission granted to share information::     Name::        Agency::     Relationship::     Contact Information:  Dtr, [name], [name], dtr.   Housing/Transportation Living arrangements for the past 2 months:  [place] of Information:  Adult Children Patient Interpreter Needed:  None Criminal Activity/Legal Involvement Pertinent to Current Situation/Hospitalization:  No - Comment as needed Significant Relationships:  Adult Children Lives with:  Self Do you feel safe going back to the place where you live?  No Need for family participation in patient care:  Yes (Comment)  Care giving concerns:  Patient has a care giver daily until 2:15 Monday -Friday and Sunday 9-1.    Social Worker assessment / plan:  Patient uses a wheelchair. Has a caregiver several hours. Patient uses a wheelchair but can walk by guiding herself with support from her furniture.  Family is agreeable to SNF and prefers the [facility].     Employment status:  Retired Nurse, adult PT Recommendations:  [place] / Referral to community resources:  [place]  Patient/Family's Response to care:  Agreeable to SNF. Patient/Family's Understanding of and Emotional Response to Diagnosis, Current Treatment, and Prognosis:  Family understand patient's diagnosis, treatment and prognosis.   Emotional Assessment Appearance:  Appears stated age Attitude/Demeanor/Rapport:  Unable to Assess Affect (typically observed):  Unable to Assess Orientation:  Oriented to Self, Oriented to Place, Oriented to Situation, Oriented to  Time Alcohol / Substance use:  Not Applicable Psych  involvement (Current and /or in the community):  No (Comment)  Discharge Needs  Concerns to be addressed:  Discharge Planning Concerns Readmission within the last 30 days:  No Current discharge risk:  None Barriers to Discharge:  No Barriers Identified   Ihor Gully, LCSW 07/27/2016, 2:28 PM

## 2016-07-27 NOTE — Progress Notes (Signed)
PROGRESS NOTE    [name]  PFX:[id number] DOB: [birth year] DOA: 07/26/2016 PCP: Celene Squibb, MD    Brief Narrative:   [name] is a 83 y.o. female with medical history significant of anemia, chronic atrial fibrillation, chronic back pain, COPD, depression, diverticulosis, fatty liver, fibromyalgia, generalized anxiety disorder, GERD, peripheral neuropathy, hyperlipidemia, hypertension, hemorrhoids, rheumatoid arthritis, PUD, vitamin D deficiency who is brought to the emergency department via EMS with complaints of nausea vomiting and diarrhea for the past week. However, she told Dr. Roxanne Mins that she had the symptoms for 2 weeks instead of 1. EMS found her on the floor, the patient is altered and unable to provide further information at this time. She is only oriented to self.    Assessment & Plan:   Principal Problem:   AKI (acute kidney injury) ([place]) Active Problems:   COPD (chronic obstructive pulmonary disease) (HCC)   GERD (gastroesophageal reflux disease)   Hypokalemia   Atrial fibrillation (HCC)   Hypertension   Acute gastroenteritis   Altered mental status   Prolonged QT interval   AKI (acute kidney injury) ([place]) Cr improving Repeat BMP in am PO fluids only  Acute gastroenteritis gastric panel by PCR negative C. Diff negative  Prolonged QT interval. Improved with electrolyte replacement    GERD (gastroesophageal reflux disease) Famotidine 20 mg IVP every 12 hours.  Hypokalemia PO replacement Repeat BMP in am  Atrial fibrillation (HCC) CHA2DS2-VASc Score of at least 5. Resume Cartia XT 300 mg by mouth daily once tolerating oral intake- likely in am  Hypertension Hold antihypertensives for now  COPD (chronic obstructive pulmonary disease) (HCC) Supplemental oxygen and bronchodilators as needed.  Altered mental status CT scan Brain Was Negative. Likely secondary to enteritis and volume depletion Per daughter patient is at  baseline    DVT prophylaxis: On Eliquis. Code Status: Full code. Family Communication: daughter is bedside Disposition Plan: [facility]   Consultants:   None  Procedures:   None  Antimicrobials:   None    Subjective: Patient reports that she is still having significant diarrhea (only two recorded bowel movements yesterday).  GI panel pending.  Daughter voices family is concerned about patient's dementia.  Objective: Vitals:   07/26/16 1400 07/26/16 2034 07/27/16 0415 07/27/16 1100  BP: (!) 99/46 121/65 129/60   Pulse: 67 85 78   Resp: 18 20 18    Temp: 98.2 F (36.8 C) 97.6 F (36.4 C) 97.8 F (36.6 C)   TempSrc:  Oral Oral   SpO2: 96% 98% 99% (!) 87%  Weight:      Height:        Intake/Output Summary (Last 24 hours) at 07/27/16 1624 Last data filed at 07/27/16 0331  Gross per 24 hour  Intake           902.83 ml  Output                0 ml  Net           902.83 ml   Filed Weights   07/26/16 0356  Weight: 97.5 kg (215 lb)    Examination:  General exam: Appears calm and comfortable  Respiratory system: Clear to auscultation. Respiratory effort normal. Cardiovascular system: S1 & S2 heard, irregularly irregular. No JVD, murmurs, rubs, gallops or clicks. No pedal edema. Gastrointestinal system: Abdomen is nondistended, soft and diffusely tender. No organomegaly or masses felt. Normal bowel sounds heard. Central nervous system: Alert and oriented to  person, place. No focal neurological deficits but could not get patient up from chair Extremities: able to move all extremities Skin: No rashes, lesions or ulcers, numerous scattered ecchymoses Psychiatry: Mood & affect appropriate.     Data Reviewed: I have personally reviewed following labs and imaging studies  CBC:  Recent Labs Lab 07/26/16 0421 07/27/16 0509  WBC 12.1* 7.3  NEUTROABS 7.9*  --   HGB 13.7 12.9  HCT 40.9 39.8  MCV 92.1 93.6  PLT 181 147*   Basic  Metabolic Panel:  Recent Labs Lab 07/26/16 0421 07/27/16 0509  NA 137 140  K 2.7* 3.1*  CL 102 108  CO2 25 25  GLUCOSE 145* 118*  BUN 18 12  CREATININE 1.55* 1.23*  CALCIUM 9.2 8.7*  MG 2.0  --    GFR: Estimated Creatinine Clearance: 35.8 mL/min (A) (by C-G formula based on SCr of 1.23 mg/dL (H)). Liver Function Tests:  Recent Labs Lab 07/26/16 0421 07/27/16 0509  AST 23 20  ALT 18 15  ALKPHOS 57 52  BILITOT 0.8 0.6  PROT 5.8* 5.3*  ALBUMIN 2.9* 2.5*    Recent Labs Lab 07/26/16 0421  LIPASE 19   No results for input(s): AMMONIA in the last 168 hours. Coagulation Profile: No results for input(s): INR, PROTIME in the last 168 hours. Cardiac Enzymes:  Recent Labs Lab 07/26/16 0421  CKTOTAL 39   BNP (last 3 results) No results for input(s): PROBNP in the last 8760 hours. HbA1C: No results for input(s): HGBA1C in the last 72 hours. CBG: No results for input(s): GLUCAP in the last 168 hours. Lipid Profile: No results for input(s): CHOL, HDL, LDLCALC, TRIG, CHOLHDL, LDLDIRECT in the last 72 hours. Thyroid Function Tests:  Recent Labs  07/26/16 0421  TSH 2.992   Anemia Panel: No results for input(s): VITAMINB12, FOLATE, FERRITIN, TIBC, IRON, RETICCTPCT in the last 72 hours. Sepsis Labs:  Recent Labs Lab 07/26/16 0442  LATICACIDVEN 1.20    Recent Results (from the past 240 hour(s))  Gastrointestinal Panel by PCR , Stool     Status: None   Collection Time: 07/26/16  6:11 AM  Result Value Ref Range Status   Campylobacter species NOT DETECTED NOT DETECTED Final   Plesimonas shigelloides NOT DETECTED NOT DETECTED Final   Salmonella species NOT DETECTED NOT DETECTED Final   Yersinia enterocolitica NOT DETECTED NOT DETECTED Final   Vibrio species NOT DETECTED NOT DETECTED Final   Vibrio cholerae NOT DETECTED NOT DETECTED Final   Enteroaggregative E coli (EAEC) NOT DETECTED NOT DETECTED Final   Enteropathogenic E coli (EPEC) NOT DETECTED NOT DETECTED  Final   Enterotoxigenic E coli (ETEC) NOT DETECTED NOT DETECTED Final   Shiga like toxin producing E coli (STEC) NOT DETECTED NOT DETECTED Final   Shigella/Enteroinvasive E coli (EIEC) NOT DETECTED NOT DETECTED Final   Cryptosporidium NOT DETECTED NOT DETECTED Final   Cyclospora cayetanensis NOT DETECTED NOT DETECTED Final   Entamoeba histolytica NOT DETECTED NOT DETECTED Final   Giardia lamblia NOT DETECTED NOT DETECTED Final   Adenovirus F40/41 NOT DETECTED NOT DETECTED Final   Astrovirus NOT DETECTED NOT DETECTED Final   Norovirus GI/GII NOT DETECTED NOT DETECTED Final   Rotavirus A NOT DETECTED NOT DETECTED Final   Sapovirus (I, II, IV, and V) NOT DETECTED NOT DETECTED Final  C difficile quick scan w PCR reflex     Status: None   Collection Time: 07/27/16 11:39 AM  Result Value Ref Range Status   C Diff antigen  NEGATIVE NEGATIVE Final   C Diff toxin NEGATIVE NEGATIVE Final   C Diff interpretation No C. difficile detected.  Final         Radiology Studies: Ct Abdomen Pelvis Wo Contrast  Result Date: 07/26/2016 CLINICAL DATA:  83 year old female with nausea, vomiting and diarrhea for the past week. Initial encounter. EXAM: CT ABDOMEN AND PELVIS WITHOUT CONTRAST TECHNIQUE: Multidetector CT imaging of the abdomen and pelvis was performed following the standard protocol without IV contrast. COMPARISON:  09/12/2014 abdominal series.  08/03/2009 PET-CT. FINDINGS: Lower chest: Basilar subsegmental atelectasis. Cardiomegaly. Mitral and aortic valve calcifications. Hepatobiliary: Taking into account limitation by non contrast imaging, no worrisome hepatic lesion. Slightly lobulated contour mild fatty infiltration. No calcified gallstones. Pancreas: Taking into account limitation by non contrast imaging, no mass or focal inflammation. Spleen: Taking into account limitation by non contrast imaging no mass. Top-normal length. Adrenals/Urinary Tract: No obstruction. Right renal 1.5 cm cyst. 1 cm  left angiomyelolipoma. No adrenal mass. Noncontrast filled views of the urinary bladder unremarkable. Stomach/Bowel: Post gastric bypass procedure. Diverticula throughout the colon most notable descending colon and sigmoid colon. Mild pericolonic inflammation portions of the transverse colon, descending colon and proximal sigmoid colon with a distribution more suspicious for colitis than diverticulitis without free intraperitoneal air or abscess collection. Evaluation of colon and stomach for detection of mass limited by degree of distention. Vascular/Lymphatic: Atherosclerotic changes aorta are and iliac artery is without aneurysmal dilation. No adenopathy. Reproductive: Post hysterectomy. Other: No bowel containing hernia. Musculoskeletal: Scoliosis with prominent degenerative changes of the lumbar spine. No obvious fracture or destructive lesion. IMPRESSION: Mild pericolonic inflammation portions of the transverse colon, descending colon and proximal sigmoid colon with a distribution more suspicious for colitis than diverticulitis without free intraperitoneal air or abscess collection. Post gastric bypass procedure. Cardiomegaly. Right renal cyst and small left angiomyolipoma. Scoliosis lumbar spine with prominent degenerative changes. Post hysterectomy. Electronically Signed   By: Genia Del M.D.   On: 07/26/2016 06:26   Dg Chest 1 View  Result Date: 07/26/2016 CLINICAL DATA:  Weakness, nausea and vomiting EXAM: CHEST 1 VIEW COMPARISON:  Chest radiograph 10/11/2014 FINDINGS: Cardiomediastinal contours remain enlarged. There is calcific aortic atherosclerosis. There is central pulmonary vascular congestion without overt pulmonary edema. No pleural effusion or pneumothorax. IMPRESSION: Cardiomegaly and aortic atherosclerosis with pulmonary vascular congestion but no overt edema. Electronically Signed   By: Ulyses Jarred M.D.   On: 07/26/2016 06:23   Ct Head Wo Contrast  Result Date: 07/26/2016 CLINICAL  DATA:  Fall, found on floor. EXAM: CT HEAD WITHOUT CONTRAST CT CERVICAL SPINE WITHOUT CONTRAST TECHNIQUE: Multidetector CT imaging of the head and cervical spine was performed following the standard protocol without intravenous contrast. Multiplanar CT image reconstructions of the cervical spine were also generated. COMPARISON:  Head CT 02/16/2016 head and cervical spine CT 09/12/2014 FINDINGS: CT HEAD FINDINGS Brain: No intracranial hemorrhage. No subdural or extra-axial fluid collection. No evidence of acute ischemia. Stable atrophy and chronic small vessel ischemic change from prior exam. No hydrocephalus, mass effect, or midline shift. Vascular: Atherosclerosis of skullbase vasculature without hyperdense vessel or abnormal calcification. Skull: No fracture or focal lesion. Sinuses/Orbits: Mastoid air cells are well-aerated. No fluid levels in the paranasal sinuses. Chronic opacification of left frontal sinus. Presumed bilateral cataract resection. Other: None. CT CERVICAL SPINE FINDINGS Alignment: There is minimal anterolisthesis of C3 on C4, C4 on C5, and C6 and C7. Minimal retrolisthesis of C5 on C6. This is unchanged from prior exam and appears  degenerative. No jumped or perched facets. Lateral masses of C1 are well aligned on C2. Skull base and vertebrae: No acute fracture. The dens and skull base are intact. Soft tissues and spinal canal: No prevertebral fluid or swelling. No visible canal hematoma. Disc levels: Multilevel disc space narrowing endplate spurring is most prominent at C4-C5 and C5-C6. Multilevel facet arthropathy. Degenerative changes have mildly progressed at C4-C5 from prior. Upper chest: No acute abnormality. Other: None. IMPRESSION: 1. Atrophy and chronic small vessel ischemia. No acute intracranial abnormality. No skull fracture. 2. Multilevel degenerative change in the cervical spine without acute fracture or subluxation. Electronically Signed   By: Jeb Levering M.D.   On: 07/26/2016  06:18   Ct Cervical Spine Wo Contrast  Result Date: 07/26/2016 CLINICAL DATA:  Fall, found on floor. EXAM: CT HEAD WITHOUT CONTRAST CT CERVICAL SPINE WITHOUT CONTRAST TECHNIQUE: Multidetector CT imaging of the head and cervical spine was performed following the standard protocol without intravenous contrast. Multiplanar CT image reconstructions of the cervical spine were also generated. COMPARISON:  Head CT 02/16/2016 head and cervical spine CT 09/12/2014 FINDINGS: CT HEAD FINDINGS Brain: No intracranial hemorrhage. No subdural or extra-axial fluid collection. No evidence of acute ischemia. Stable atrophy and chronic small vessel ischemic change from prior exam. No hydrocephalus, mass effect, or midline shift. Vascular: Atherosclerosis of skullbase vasculature without hyperdense vessel or abnormal calcification. Skull: No fracture or focal lesion. Sinuses/Orbits: Mastoid air cells are well-aerated. No fluid levels in the paranasal sinuses. Chronic opacification of left frontal sinus. Presumed bilateral cataract resection. Other: None. CT CERVICAL SPINE FINDINGS Alignment: There is minimal anterolisthesis of C3 on C4, C4 on C5, and C6 and C7. Minimal retrolisthesis of C5 on C6. This is unchanged from prior exam and appears degenerative. No jumped or perched facets. Lateral masses of C1 are well aligned on C2. Skull base and vertebrae: No acute fracture. The dens and skull base are intact. Soft tissues and spinal canal: No prevertebral fluid or swelling. No visible canal hematoma. Disc levels: Multilevel disc space narrowing endplate spurring is most prominent at C4-C5 and C5-C6. Multilevel facet arthropathy. Degenerative changes have mildly progressed at C4-C5 from prior. Upper chest: No acute abnormality. Other: None. IMPRESSION: 1. Atrophy and chronic small vessel ischemia. No acute intracranial abnormality. No skull fracture. 2. Multilevel degenerative change in the cervical spine without acute fracture or  subluxation. Electronically Signed   By: Jeb Levering M.D.   On: 07/26/2016 06:18        Scheduled Meds: . potassium chloride  20 mEq Oral BID   Continuous Infusions: . famotidine (PEPCID) IV Stopped (07/27/16 1030)     LOS: 1 day    Time spent: 30 minutes    Loretha Stapler, MD [facility] Pager [phone]  If 7PM-7AM, please contact night-coverage [email or web address] Password [email or web address] 07/27/2016, 4:24 PM

## 2016-07-27 NOTE — Care Management Note (Signed)
Case Management Note  Patient Details  Name: [name] MRN: [id number] Date of Birth: 05-07-1928  Subjective/Objective:                  Pt admitted with AKI. She is from home, lives alone and has aid 6hrs per day. She is mostly in the wheelchair and has had frequent falls per her daughter. Pt has daughter (shila) who is her POA and 3 other children. She reports her children not being very involved. She has supplemental oxygen pta. PT has recommended SNF and pt is agreeable.   Action/Plan: Anticipate DC to SNF tomorrow, CSW has made arrangements for placement. No CM needs anticipated.   Expected Discharge Date:  07/29/16               Expected Discharge Plan:  Home/Self Care  In-House Referral:  NA  Discharge planning Services  CM Consult  Post Acute Care Choice:  NA Choice offered to:  NA  Status of Service:  Completed, signed off  Sherald Barge, RN 07/27/2016, 3:05 PM

## 2016-07-28 ENCOUNTER — Inpatient Hospital Stay
Admission: RE | Admit: 2016-07-28 | Discharge: 2016-08-29 | Disposition: A | Discharge status: Skilled Nursing Facility | Payer: Medicare Other | Source: Ambulatory Visit | Attending: Internal Medicine | Admitting: Internal Medicine

## 2016-07-28 DIAGNOSIS — Y92009 Unspecified place in unspecified non-institutional (private) residence as the place of occurrence of the external cause: Secondary | ICD-10-CM

## 2016-07-28 DIAGNOSIS — I159 Secondary hypertension, unspecified: Secondary | ICD-10-CM

## 2016-07-28 LAB — BASIC METABOLIC PANEL
Anion gap: 5 (ref 5–15)
BUN: 6 mg/dL (ref 6–20)
CALCIUM: 8.9 mg/dL (ref 8.9–10.3)
CHLORIDE: 109 mmol/L (ref 101–111)
CO2: 25 mmol/L (ref 22–32)
CREATININE: 1.13 mg/dL — AB (ref 0.44–1.00)
GFR calc Af Amer: 50 mL/min — ABNORMAL LOW (ref >60)
GFR calc non Af Amer: 43 mL/min — ABNORMAL LOW (ref >60)
Glucose, Bld: 183 mg/dL — ABNORMAL HIGH (ref 65–99)
Potassium: 3.8 mmol/L (ref 3.5–5.1)
SODIUM: 139 mmol/L (ref 135–145)

## 2016-07-28 LAB — ECHOCARDIOGRAM COMPLETE
HEIGHTINCHES: 62 in
Weight: 3440 oz

## 2016-07-28 NOTE — Clinical Social Work Placement (Signed)
   CLINICAL SOCIAL WORK PLACEMENT  NOTE  Date:  07/28/2016  Patient Details  Name: [name] MRN: [id number] Date of Birth: April 02, 1928  Clinical Social Work is seeking post-discharge placement for this patient at the [place] level of care (*CSW will initial, date and re-position this form in  chart as items are completed):  Yes   Patient/family provided with [place] Work Department's list of facilities offering this level of care within the geographic area requested by the patient (or if unable, by the patient's family).  Yes   Patient/family informed of their freedom to choose among providers that offer the needed level of care, that participate in Medicare, Medicaid or managed care program needed by the patient, have an available bed and are willing to accept the patient.  Yes   Patient/family informed of [facility]'s ownership interest in [facility] and [facility], as well as of the fact that they are under no obligation to receive care at these facilities.  PASRR submitted to EDS on       PASRR number received on       Existing PASRR number confirmed on 07/27/16     FL2 transmitted to all facilities in geographic area requested by pt/family on 07/27/16     FL2 transmitted to all facilities within larger geographic area on       Patient informed that his/her managed care company has contracts with or will negotiate with certain facilities, including the following:        Yes   Patient/family informed of bed offers received.  Patient chooses bed at [facility]     Physician recommends and patient chooses bed at      Patient to be transferred to [facility] on 07/28/16.  Patient to be transferred to facility by [facility] staff     Patient family notified on 07/28/16 of transfer.  Name of family member notified:  Catarina Hartshorn, dtr     PHYSICIAN       Additional Comment:     _______________________________________________ Ihor Gully, LCSW 07/28/2016, 1:04 PM

## 2016-07-28 NOTE — Clinical Social Work Note (Signed)
LCSW spoke with Marianna Fuss at [facility] and advised that patient was discharging today.   LCSW left a message for patient's daughter, Catarina Hartshorn, advising of discharge.   LCSW signing off.       Philopateer Strine, Clydene Pugh, LCSW

## 2016-07-28 NOTE — Discharge Summary (Signed)
Physician Discharge Summary  [name] NFA:[id number] DOB: 15-Jun-1928 DOA: 07/26/2016  PCP: Celene Squibb, MD  Admit date: 07/26/2016 Discharge date: 07/28/2016  Admitted From: Home  Disposition:  [facility]  Recommendations for Outpatient Follow-up:  1. Follow up with facility MD at first visit 2. Please obtain BMP/CBC in one week 3. Take medications as prescribed  Home Health: No Equipment/Devices: None   Discharge Condition: Stable CODE STATUS: Full code Diet recommendation: Heart Healthy  Brief/Interim Summary: Becky Melland Reynoldsis a 83 y.o.femalewith medical history significant of anemia, chronic atrial fibrillation, chronic back pain, COPD, depression, diverticulosis, fatty liver, fibromyalgia, generalized anxiety disorder, GERD, peripheral neuropathy, hyperlipidemia, hypertension, hemorrhoids, rheumatoid arthritis, PUD, vitamin D deficiency who is brought to the emergency department via EMS with complaints of nauseavomiting and diarrhea for the past week. However, she told Dr. Roxanne Mins that she had the symptoms for 2 weeks instead of 1. EMS found her on the floor, the patient is altered and unable to provide further information at this time. Patient was admitted and monitored.  C. Diff testing and gastrointestinal panel both negative.  She tolerated a diet and electrolytes were replaced.  She was stable for discharge on 5/17 to [facility] for rehab.    Discharge Diagnoses:  Principal Problem:   AKI (acute kidney injury) ([facility]) Active Problems:   COPD (chronic obstructive pulmonary disease) (HCC)   GERD (gastroesophageal reflux disease)   Hypokalemia   Atrial fibrillation (HCC)   Hypertension   Acute gastroenteritis   Altered mental status   Prolonged QT interval    Discharge Instructions  Discharge Instructions    Call MD for:  difficulty breathing, headache or visual disturbances    Complete by:  As directed    Call MD for:  extreme fatigue     Complete by:  As directed    Call MD for:  hives    Complete by:  As directed    Call MD for:  persistant dizziness or light-headedness    Complete by:  As directed    Call MD for:  persistant nausea and vomiting    Complete by:  As directed    Call MD for:  severe uncontrolled pain    Complete by:  As directed    Call MD for:  temperature >100.4    Complete by:  As directed    Diet - low sodium heart healthy    Complete by:  As directed    Discharge instructions    Complete by:  As directed    Take medications as prescribed BMP in 4 days PO as tolerated Therapy per facility protocol   Increase activity slowly    Complete by:  As directed      Allergies as of 07/28/2016      Reactions   Other    Band Aids    Tape    Lipitor [atorvastatin Calcium] Rash, Other (See Comments)   Muscle pain   Niaspan [niacin Er] Rash, Other (See Comments)   Muscle pain      Medication List    TAKE these medications   CARTIA XT 300 MG 24 hr capsule Generic drug:  diltiazem TAKE ONE CAPSULE BY MOUTH EVERY DAY   cyanocobalamin 100 MCG tablet Take 100 mcg by mouth daily.   donepezil 5 MG tablet Commonly known as:  ARICEPT Take 5 mg by mouth at bedtime.   DULoxetine 60 MG capsule Commonly known as:  CYMBALTA Take 60 mg by mouth daily.  ELIQUIS 2.5 MG Tabs tablet Generic drug:  apixaban Take 2.5 mg by mouth 2 (two) times daily.   EYE DROPS OP Place 1 drop into both eyes daily as needed (dry eyes). OTC   ferrous gluconate 325 MG tablet Commonly known as:  FERGON Take 325 mg by mouth daily.   folic acid 1 MG tablet Commonly known as:  FOLVITE Take 1 mg by mouth daily.   LORazepam 0.5 MG tablet Commonly known as:  ATIVAN Take 0.25 mg by mouth 2 (two) times daily.   LYRICA 100 MG capsule Generic drug:  pregabalin Take 100 mg by mouth 3 (three) times daily.   methotrexate 2.5 MG tablet Commonly known as:  RHEUMATREX Take 12.5 mg by mouth once a week. *Taken on Thursdays  at 1200Caution:Chemotherapy. Protect from light.   multivitamin with minerals tablet Take 1 tablet by mouth daily.   oxyCODONE-acetaminophen 7.5-325 MG tablet Commonly known as:  PERCOCET Take 1 tablet by mouth 4 (four) times daily as needed.   pantoprazole 40 MG tablet Commonly known as:  PROTONIX Take 40 mg by mouth 2 (two) times daily.   polyethylene glycol packet Commonly known as:  MIRALAX / GLYCOLAX Take 17 g by mouth daily as needed for mild constipation or moderate constipation.   predniSONE 5 MG tablet Commonly known as:  DELTASONE Take 2.5 mg by mouth daily.   PROAIR HFA 108 (90 Base) MCG/ACT inhaler Generic drug:  albuterol Inhale 2 puffs into the lungs every 6 (six) hours as needed for wheezing or shortness of breath. For rescue   PROBIOTIC FORMULA PO Take 1 tablet by mouth daily.   raloxifene 60 MG tablet Commonly known as:  EVISTA Take 60 mg by mouth daily.   rosuvastatin 20 MG tablet Commonly known as:  CRESTOR Take 20 mg by mouth daily.   vitamin C 500 MG tablet Commonly known as:  ASCORBIC ACID Take 500 mg by mouth daily.   Vitamin D3 1000 units Caps Take 1 capsule by mouth daily.      Contact information for after-discharge care    [facility] .   Specialty:  Skilled Nursing Facility Contact information: [address] [place] [address] [phone]             Allergies  Allergen Reactions  . Other     Band Aids   . Tape   . Lipitor [Atorvastatin Calcium] Rash and Other (See Comments)    Muscle pain  . Niaspan [Niacin Er] Rash and Other (See Comments)    Muscle pain    Consultations:  Physical Therapy   Procedures/Studies: Ct Abdomen Pelvis Wo Contrast  Result Date: 07/26/2016 CLINICAL DATA:  83 year old female with nausea, vomiting and diarrhea for the past week. Initial encounter. EXAM: CT ABDOMEN AND PELVIS WITHOUT CONTRAST TECHNIQUE: Multidetector CT imaging of the  abdomen and pelvis was performed following the standard protocol without IV contrast. COMPARISON:  09/12/2014 abdominal series.  08/03/2009 PET-CT. FINDINGS: Lower chest: Basilar subsegmental atelectasis. Cardiomegaly. Mitral and aortic valve calcifications. Hepatobiliary: Taking into account limitation by non contrast imaging, no worrisome hepatic lesion. Slightly lobulated contour mild fatty infiltration. No calcified gallstones. Pancreas: Taking into account limitation by non contrast imaging, no mass or focal inflammation. Spleen: Taking into account limitation by non contrast imaging no mass. Top-normal length. Adrenals/Urinary Tract: No obstruction. Right renal 1.5 cm cyst. 1 cm left angiomyelolipoma. No adrenal mass. Noncontrast filled views of the urinary bladder unremarkable. Stomach/Bowel:  Post gastric bypass procedure. Diverticula throughout the colon most notable descending colon and sigmoid colon. Mild pericolonic inflammation portions of the transverse colon, descending colon and proximal sigmoid colon with a distribution more suspicious for colitis than diverticulitis without free intraperitoneal air or abscess collection. Evaluation of colon and stomach for detection of mass limited by degree of distention. Vascular/Lymphatic: Atherosclerotic changes aorta are and iliac artery is without aneurysmal dilation. No adenopathy. Reproductive: Post hysterectomy. Other: No bowel containing hernia. Musculoskeletal: Scoliosis with prominent degenerative changes of the lumbar spine. No obvious fracture or destructive lesion. IMPRESSION: Mild pericolonic inflammation portions of the transverse colon, descending colon and proximal sigmoid colon with a distribution more suspicious for colitis than diverticulitis without free intraperitoneal air or abscess collection. Post gastric bypass procedure. Cardiomegaly. Right renal cyst and small left angiomyolipoma. Scoliosis lumbar spine with prominent degenerative  changes. Post hysterectomy. Electronically Signed   By: Genia Del M.D.   On: 07/26/2016 06:26   Dg Chest 1 View  Result Date: 07/26/2016 CLINICAL DATA:  Weakness, nausea and vomiting EXAM: CHEST 1 VIEW COMPARISON:  Chest radiograph 10/11/2014 FINDINGS: Cardiomediastinal contours remain enlarged. There is calcific aortic atherosclerosis. There is central pulmonary vascular congestion without overt pulmonary edema. No pleural effusion or pneumothorax. IMPRESSION: Cardiomegaly and aortic atherosclerosis with pulmonary vascular congestion but no overt edema. Electronically Signed   By: Ulyses Jarred M.D.   On: 07/26/2016 06:23   Ct Head Wo Contrast  Result Date: 07/26/2016 CLINICAL DATA:  Fall, found on floor. EXAM: CT HEAD WITHOUT CONTRAST CT CERVICAL SPINE WITHOUT CONTRAST TECHNIQUE: Multidetector CT imaging of the head and cervical spine was performed following the standard protocol without intravenous contrast. Multiplanar CT image reconstructions of the cervical spine were also generated. COMPARISON:  Head CT 02/16/2016 head and cervical spine CT 09/12/2014 FINDINGS: CT HEAD FINDINGS Brain: No intracranial hemorrhage. No subdural or extra-axial fluid collection. No evidence of acute ischemia. Stable atrophy and chronic small vessel ischemic change from prior exam. No hydrocephalus, mass effect, or midline shift. Vascular: Atherosclerosis of skullbase vasculature without hyperdense vessel or abnormal calcification. Skull: No fracture or focal lesion. Sinuses/Orbits: Mastoid air cells are well-aerated. No fluid levels in the paranasal sinuses. Chronic opacification of left frontal sinus. Presumed bilateral cataract resection. Other: None. CT CERVICAL SPINE FINDINGS Alignment: There is minimal anterolisthesis of C3 on C4, C4 on C5, and C6 and C7. Minimal retrolisthesis of C5 on C6. This is unchanged from prior exam and appears degenerative. No jumped or perched facets. Lateral masses of C1 are well aligned  on C2. Skull base and vertebrae: No acute fracture. The dens and skull base are intact. Soft tissues and spinal canal: No prevertebral fluid or swelling. No visible canal hematoma. Disc levels: Multilevel disc space narrowing endplate spurring is most prominent at C4-C5 and C5-C6. Multilevel facet arthropathy. Degenerative changes have mildly progressed at C4-C5 from prior. Upper chest: No acute abnormality. Other: None. IMPRESSION: 1. Atrophy and chronic small vessel ischemia. No acute intracranial abnormality. No skull fracture. 2. Multilevel degenerative change in the cervical spine without acute fracture or subluxation. Electronically Signed   By: Jeb Levering M.D.   On: 07/26/2016 06:18   Ct Cervical Spine Wo Contrast  Result Date: 07/26/2016 CLINICAL DATA:  Fall, found on floor. EXAM: CT HEAD WITHOUT CONTRAST CT CERVICAL SPINE WITHOUT CONTRAST TECHNIQUE: Multidetector CT imaging of the head and cervical spine was performed following the standard protocol without intravenous contrast. Multiplanar CT image reconstructions of the cervical spine were also  generated. COMPARISON:  Head CT 02/16/2016 head and cervical spine CT 09/12/2014 FINDINGS: CT HEAD FINDINGS Brain: No intracranial hemorrhage. No subdural or extra-axial fluid collection. No evidence of acute ischemia. Stable atrophy and chronic small vessel ischemic change from prior exam. No hydrocephalus, mass effect, or midline shift. Vascular: Atherosclerosis of skullbase vasculature without hyperdense vessel or abnormal calcification. Skull: No fracture or focal lesion. Sinuses/Orbits: Mastoid air cells are well-aerated. No fluid levels in the paranasal sinuses. Chronic opacification of left frontal sinus. Presumed bilateral cataract resection. Other: None. CT CERVICAL SPINE FINDINGS Alignment: There is minimal anterolisthesis of C3 on C4, C4 on C5, and C6 and C7. Minimal retrolisthesis of C5 on C6. This is unchanged from prior exam and appears  degenerative. No jumped or perched facets. Lateral masses of C1 are well aligned on C2. Skull base and vertebrae: No acute fracture. The dens and skull base are intact. Soft tissues and spinal canal: No prevertebral fluid or swelling. No visible canal hematoma. Disc levels: Multilevel disc space narrowing endplate spurring is most prominent at C4-C5 and C5-C6. Multilevel facet arthropathy. Degenerative changes have mildly progressed at C4-C5 from prior. Upper chest: No acute abnormality. Other: None. IMPRESSION: 1. Atrophy and chronic small vessel ischemia. No acute intracranial abnormality. No skull fracture. 2. Multilevel degenerative change in the cervical spine without acute fracture or subluxation. Electronically Signed   By: Jeb Levering M.D.   On: 07/26/2016 06:18   Echocardiogram: Mild LVH with LVEF 55-60% and indeterminate diastolic function. Upper normal left atrial chamber size. Calcified mitral annulus with mild mitral regurgitation. Mild to moderate calcific aortic stenosis as described above. There is moderate, nonmobile ascending aortic calcification. Mild tricuspid regurgitation with PASP estimated 33 mmHg   Subjective: Patient reports this morning she is hungry and is asking for food.  States her belly feels better.    Discharge Exam: Vitals:   07/28/16 0451 07/28/16 1300  BP: (!) 148/77 138/68  Pulse: 81 80  Resp: 20 20  Temp: 98.7 F (37.1 C) 98.5 F (36.9 C)   Vitals:   07/27/16 1300 07/27/16 2141 07/28/16 0451 07/28/16 1300  BP: 131/64 140/66 (!) 148/77 138/68  Pulse: 76 83 81 80  Resp: 20 20 20 20   Temp: 98.1 F (36.7 C) 98 F (36.7 C) 98.7 F (37.1 C) 98.5 F (36.9 C)  TempSrc: Oral Oral Oral Oral  SpO2: 96% 97% 99% 98%  Weight:      Height:        General: Pt is alert, awake, not in acute distress Cardiovascular: RRR, S1/S2 +, no rubs, no gallops Respiratory: CTA bilaterally, no wheezing, no rhonchi Abdominal: Soft, obese, NT, ND, bowel sounds  + Extremities: no edema, no cyanosis    The results of significant diagnostics from this hospitalization (including imaging, microbiology, ancillary and laboratory) are listed below for reference.     Microbiology: Recent Results (from the past 240 hour(s))  Gastrointestinal Panel by PCR , Stool     Status: None   Collection Time: 07/26/16  6:11 AM  Result Value Ref Range Status   Campylobacter species NOT DETECTED NOT DETECTED Final   Plesimonas shigelloides NOT DETECTED NOT DETECTED Final   Salmonella species NOT DETECTED NOT DETECTED Final   Yersinia enterocolitica NOT DETECTED NOT DETECTED Final   Vibrio species NOT DETECTED NOT DETECTED Final   Vibrio cholerae NOT DETECTED NOT DETECTED Final   Enteroaggregative E coli (EAEC) NOT DETECTED NOT DETECTED Final   Enteropathogenic E coli (EPEC) NOT DETECTED NOT  DETECTED Final   Enterotoxigenic E coli (ETEC) NOT DETECTED NOT DETECTED Final   Shiga like toxin producing E coli (STEC) NOT DETECTED NOT DETECTED Final   Shigella/Enteroinvasive E coli (EIEC) NOT DETECTED NOT DETECTED Final   Cryptosporidium NOT DETECTED NOT DETECTED Final   Cyclospora cayetanensis NOT DETECTED NOT DETECTED Final   Entamoeba histolytica NOT DETECTED NOT DETECTED Final   Giardia lamblia NOT DETECTED NOT DETECTED Final   Adenovirus F40/41 NOT DETECTED NOT DETECTED Final   Astrovirus NOT DETECTED NOT DETECTED Final   Norovirus GI/GII NOT DETECTED NOT DETECTED Final   Rotavirus A NOT DETECTED NOT DETECTED Final   Sapovirus (I, II, IV, and V) NOT DETECTED NOT DETECTED Final  C difficile quick scan w PCR reflex     Status: None   Collection Time: 07/27/16 11:39 AM  Result Value Ref Range Status   C Diff antigen NEGATIVE NEGATIVE Final   C Diff toxin NEGATIVE NEGATIVE Final   C Diff interpretation No C. difficile detected.  Final     Labs: BNP (last 3 results) No results for input(s): BNP in the last 8760 hours. Basic Metabolic Panel:  Recent Labs Lab  07/26/16 0421 07/27/16 0509 07/28/16 0957  NA 137 140 139  K 2.7* 3.1* 3.8  CL 102 108 109  CO2 25 25 25   GLUCOSE 145* 118* 183*  BUN 18 12 6   CREATININE 1.55* 1.23* 1.13*  CALCIUM 9.2 8.7* 8.9  MG 2.0  --   --    Liver Function Tests:  Recent Labs Lab 07/26/16 0421 07/27/16 0509  AST 23 20  ALT 18 15  ALKPHOS 57 52  BILITOT 0.8 0.6  PROT 5.8* 5.3*  ALBUMIN 2.9* 2.5*    Recent Labs Lab 07/26/16 0421  LIPASE 19   No results for input(s): AMMONIA in the last 168 hours. CBC:  Recent Labs Lab 07/26/16 0421 07/27/16 0509  WBC 12.1* 7.3  NEUTROABS 7.9*  --   HGB 13.7 12.9  HCT 40.9 39.8  MCV 92.1 93.6  PLT 181 147*   Cardiac Enzymes:  Recent Labs Lab 07/26/16 0421  CKTOTAL 39   BNP: Invalid input(s): POCBNP CBG: No results for input(s): GLUCAP in the last 168 hours. D-Dimer No results for input(s): DDIMER in the last 72 hours. Hgb A1c No results for input(s): HGBA1C in the last 72 hours. Lipid Profile No results for input(s): CHOL, HDL, LDLCALC, TRIG, CHOLHDL, LDLDIRECT in the last 72 hours. Thyroid function studies  Recent Labs  07/26/16 0421  TSH 2.992   Anemia work up No results for input(s): VITAMINB12, FOLATE, FERRITIN, TIBC, IRON, RETICCTPCT in the last 72 hours. Urinalysis    Component Value Date/Time   COLORURINE AMBER (A) 07/26/2016 0413   APPEARANCEUR HAZY (A) 07/26/2016 0413   LABSPEC 1.018 07/26/2016 0413   PHURINE 5.0 07/26/2016 0413   GLUCOSEU NEGATIVE 07/26/2016 0413   HGBUR NEGATIVE 07/26/2016 0413   BILIRUBINUR NEGATIVE 07/26/2016 0413   KETONESUR NEGATIVE 07/26/2016 0413   PROTEINUR 30 (A) 07/26/2016 0413   UROBILINOGEN 1.0 10/11/2014 1428   NITRITE NEGATIVE 07/26/2016 0413   LEUKOCYTESUR NEGATIVE 07/26/2016 0413   Sepsis Labs Invalid input(s): PROCALCITONIN,  WBC,  LACTICIDVEN Microbiology Recent Results (from the past 240 hour(s))  Gastrointestinal Panel by PCR , Stool     Status: None   Collection Time:  07/26/16  6:11 AM  Result Value Ref Range Status   Campylobacter species NOT DETECTED NOT DETECTED Final   Plesimonas shigelloides NOT DETECTED NOT DETECTED  Final   Salmonella species NOT DETECTED NOT DETECTED Final   Yersinia enterocolitica NOT DETECTED NOT DETECTED Final   Vibrio species NOT DETECTED NOT DETECTED Final   Vibrio cholerae NOT DETECTED NOT DETECTED Final   Enteroaggregative E coli (EAEC) NOT DETECTED NOT DETECTED Final   Enteropathogenic E coli (EPEC) NOT DETECTED NOT DETECTED Final   Enterotoxigenic E coli (ETEC) NOT DETECTED NOT DETECTED Final   Shiga like toxin producing E coli (STEC) NOT DETECTED NOT DETECTED Final   Shigella/Enteroinvasive E coli (EIEC) NOT DETECTED NOT DETECTED Final   Cryptosporidium NOT DETECTED NOT DETECTED Final   Cyclospora cayetanensis NOT DETECTED NOT DETECTED Final   Entamoeba histolytica NOT DETECTED NOT DETECTED Final   Giardia lamblia NOT DETECTED NOT DETECTED Final   Adenovirus F40/41 NOT DETECTED NOT DETECTED Final   Astrovirus NOT DETECTED NOT DETECTED Final   Norovirus GI/GII NOT DETECTED NOT DETECTED Final   Rotavirus A NOT DETECTED NOT DETECTED Final   Sapovirus (I, II, IV, and V) NOT DETECTED NOT DETECTED Final  C difficile quick scan w PCR reflex     Status: None   Collection Time: 07/27/16 11:39 AM  Result Value Ref Range Status   C Diff antigen NEGATIVE NEGATIVE Final   C Diff toxin NEGATIVE NEGATIVE Final   C Diff interpretation No C. difficile detected.  Final     Time coordinating discharge: 35 minutes  SIGNED:   Loretha Stapler, MD  [facility] 07/28/2016, 1:36 PM Pager [phone] If 7PM-7AM, please contact night-coverage [email or web address] Password [email or web address]

## 2016-07-28 NOTE — Care Management Important Message (Signed)
Important Message  Patient Details  Name: [name] MRN: [id number] Date of Birth: 1928-10-22   Medicare Important Message Given:  Yes    Sherald Barge, RN 07/28/2016, 1:41 PM

## 2016-07-28 NOTE — Progress Notes (Signed)
*  PRELIMINARY RESULTS* Echocardiogram 2D Echocardiogram has been performed.  Leavy Cella 07/28/2016, 8:15 AM

## 2016-07-28 NOTE — Progress Notes (Signed)
Physical Therapy Treatment Patient Details Name: [name] MRN: [id number] DOB: Mar 31, 1928 Today's Date: 07/28/2016    History of Present Illness  83 y.o. female with medical history significant of anemia, chronic atrial fibrillation, chronic back pain, COPD, depression, diverticulosis, fatty liver, fibromyalgia, generalized anxiety disorder, GERD, peripheral neuropathy, hyperlipidemia, hypertension, hemorrhoids, rheumatoid arthritis, PUD, vitamin D deficiency who is brought to the emergency department via EMS with complaints of nausea vomiting and diarrhea for the past week. However, she told Dr. Roxanne Mins that she had the symptoms for 2 weeks instead of 1. EMS found her on the floor, the patient is altered and unable to provide further information at this time.   chest radiograph showed cardiomegaly with mild vascular congestion, CT scan head, cervical spine did not show any acute abnormalities. CT scan abdomen/pelvis shows findings consistent with colitis.    PT Comments    Pt received in bed, states that she was very tired today, but was agreeable to PT tx.  She required increased time for bed mobility today, but only required min guard for SPT from the bed<>chair.  Continue to recommend SNF due to pt's high risk for falling.       Follow Up Recommendations  SNF     Equipment Recommendations  None recommended by PT    Recommendations for Other Services       Precautions / Restrictions Precautions Precautions: Fall Precaution Comments: Pt reports that she has had multiple falls, but cannot tell me how many.  Dtr confirms this and states that she has never allowed them to take her to the hospital. Pt states she was trying to get onto the bathroom commode and when she stood, her foot was on something, and she slid and got caught between the commode and wall.  Restrictions Weight Bearing Restrictions: No    Mobility  Bed Mobility Overal bed mobility: Needs Assistance Bed  Mobility: Supine to Sit     Supine to sit: Supervision;HOB elevated     General bed mobility comments: Pt requires increased time to perform bed mobility  Transfers Overall transfer level: Needs assistance Equipment used: None Transfers: Stand Pivot Transfers   Stand pivot transfers: Min guard       General transfer comment: Pt was able to pivot over to the chair   Ambulation/Gait                 Stairs            Wheelchair Mobility    Modified Rankin (Stroke Patients Only)       Balance Overall balance assessment: History of Falls;Needs assistance Sitting-balance support: Bilateral upper extremity supported;Feet supported Sitting balance-Leahy Scale: Good     Standing balance support: Bilateral upper extremity supported Standing balance-Leahy Scale: Fair                              Cognition Arousal/Alertness: Awake/alert Behavior During Therapy: WFL for tasks assessed/performed Overall Cognitive Status: Within Functional Limits for tasks assessed                                        Exercises      General Comments        Pertinent Vitals/Pain Pain Assessment:  (Pt states that she is just uncomfortable today because of having to be on the commode so long.)  Home Living                      Prior Function            PT Goals (current goals can now be found in the care plan section) Acute Rehab PT Goals Patient Stated Goal: To get stronger  PT Goal Formulation: With patient/family Time For Goal Achievement: 08/10/16 Potential to Achieve Goals: Fair Progress towards PT goals: Progressing toward goals    Frequency    Min 3X/week      PT Plan Current plan remains appropriate    Co-evaluation              AM-PAC PT "6 Clicks" Daily Activity  Outcome Measure  Difficulty turning over in bed (including adjusting bedclothes, sheets and blankets)?: None Difficulty moving from  lying on back to sitting on the side of the bed? : A Little Difficulty sitting down on and standing up from a chair with arms (e.g., wheelchair, bedside commode, etc,.)?: A Little Help needed moving to and from a bed to chair (including a wheelchair)?: A Little Help needed walking in hospital room?: A Lot Help needed climbing 3-5 steps with a railing? : Total 6 Click Score: 16    End of Session Equipment Utilized During Treatment: Gait belt;Oxygen Activity Tolerance: Patient limited by fatigue Patient left: in chair;with call bell/phone within reach Nurse Communication: Mobility status PT Visit Diagnosis: Unsteadiness on feet (R26.81);History of falling (Z91.81);Muscle weakness (generalized) (M62.81);Repeated falls (R29.6);Other abnormalities of gait and mobility (R26.89)     Time: 9191-6606 PT Time Calculation (min) (ACUTE ONLY): 17 min  Charges:  $Therapeutic Activity: 8-22 mins                    G Codes:       Beth Jaice Lague, PT, DPT X: [phone]

## 2016-08-01 ENCOUNTER — Encounter (HOSPITAL_COMMUNITY)
Admission: RE | Admit: 2016-08-01 | Discharge: 2016-08-01 | Disposition: A | Discharge status: Home / Self Care | Payer: Medicare Other | Source: Skilled Nursing Facility | Attending: Internal Medicine | Admitting: Internal Medicine

## 2016-08-01 ENCOUNTER — Non-Acute Institutional Stay (SKILLED_NURSING_FACILITY): Payer: Medicare Other | Admitting: Internal Medicine

## 2016-08-01 ENCOUNTER — Encounter: Payer: Self-pay | Admitting: Internal Medicine

## 2016-08-01 DIAGNOSIS — F329 Major depressive disorder, single episode, unspecified: Secondary | ICD-10-CM | POA: Insufficient documentation

## 2016-08-01 DIAGNOSIS — K219 Gastro-esophageal reflux disease without esophagitis: Secondary | ICD-10-CM

## 2016-08-01 DIAGNOSIS — G9009 Other idiopathic peripheral autonomic neuropathy: Secondary | ICD-10-CM | POA: Insufficient documentation

## 2016-08-01 DIAGNOSIS — I00 Rheumatic fever without heart involvement: Secondary | ICD-10-CM

## 2016-08-01 DIAGNOSIS — K529 Noninfective gastroenteritis and colitis, unspecified: Secondary | ICD-10-CM | POA: Diagnosis not present

## 2016-08-01 DIAGNOSIS — F411 Generalized anxiety disorder: Secondary | ICD-10-CM | POA: Insufficient documentation

## 2016-08-01 DIAGNOSIS — M0520 Rheumatoid vasculitis with rheumatoid arthritis of unspecified site: Secondary | ICD-10-CM

## 2016-08-01 DIAGNOSIS — M7989 Other specified soft tissue disorders: Secondary | ICD-10-CM | POA: Diagnosis not present

## 2016-08-01 DIAGNOSIS — D509 Iron deficiency anemia, unspecified: Secondary | ICD-10-CM | POA: Insufficient documentation

## 2016-08-01 DIAGNOSIS — I482 Chronic atrial fibrillation, unspecified: Secondary | ICD-10-CM

## 2016-08-01 DIAGNOSIS — G629 Polyneuropathy, unspecified: Secondary | ICD-10-CM | POA: Insufficient documentation

## 2016-08-01 DIAGNOSIS — R9431 Abnormal electrocardiogram [ECG] [EKG]: Secondary | ICD-10-CM

## 2016-08-01 DIAGNOSIS — E785 Hyperlipidemia, unspecified: Secondary | ICD-10-CM | POA: Insufficient documentation

## 2016-08-01 DIAGNOSIS — E559 Vitamin D deficiency, unspecified: Secondary | ICD-10-CM | POA: Insufficient documentation

## 2016-08-01 LAB — CBC
HEMATOCRIT: 44.7 % (ref 36.0–46.0)
Hemoglobin: 14 g/dL (ref 12.0–15.0)
MCH: 29.9 pg (ref 26.0–34.0)
MCHC: 31.3 g/dL (ref 30.0–36.0)
MCV: 95.5 fL (ref 78.0–100.0)
Platelets: 135 10*3/uL — ABNORMAL LOW (ref 150–400)
RBC: 4.68 MIL/uL (ref 3.87–5.11)
RDW: 15.5 % (ref 11.5–15.5)
WBC: 8.2 10*3/uL (ref 4.0–10.5)

## 2016-08-01 LAB — BASIC METABOLIC PANEL
ANION GAP: 7 (ref 5–15)
BUN: 13 mg/dL (ref 6–20)
CALCIUM: 9.4 mg/dL (ref 8.9–10.3)
CO2: 32 mmol/L (ref 22–32)
Chloride: 101 mmol/L (ref 101–111)
Creatinine, Ser: 1.2 mg/dL — ABNORMAL HIGH (ref 0.44–1.00)
GFR calc Af Amer: 46 mL/min — ABNORMAL LOW (ref >60)
GFR calc non Af Amer: 40 mL/min — ABNORMAL LOW (ref >60)
GLUCOSE: 153 mg/dL — AB (ref 65–99)
POTASSIUM: 3.1 mmol/L — AB (ref 3.5–5.1)
Sodium: 140 mmol/L (ref 135–145)

## 2016-08-01 NOTE — Progress Notes (Signed)
Provider:  Veleta Miners Location:   [facility] Room Number: 130/P Place of Service:  SNF (31)  PCP: Celene Squibb, MD Patient Care Team: Celene Squibb, MD as PCP - General  Extended Emergency Contact Information Primary Emergency Contact: Nance,Sheila Address: [address]          [place], [place] [address] [place] Phone: [phone] Relation: Daughter Secondary Emergency Contact: Charlynne Pander, [place] [address] [place] Phone: [phone] Relation: Grandaughter  Code Status: Full Code Goals of Care: Advanced Directive information Advanced Directives 08/01/2016  Does Patient Have a Medical Advance Directive? Yes  Type of Advance Directive (No Data)  Does patient want to make changes to medical advance directive? No - Patient declined  Copy of [place] in Chart? -  Pre-existing out of facility DNR order (yellow form or pink MOST form) -      Chief Complaint  Patient presents with  . New Admit To SNF    HPI: Patient is a 83 y.o. female seen today for admission to SNF for therapy  Patient has multiple medical problems including Chronic Atrial fibrillation on Eliquis, Rheumatoid Arthritis, COPD, Fibromyalgia, Peripheral Neuropathy, Hyperlipidemia, Anxiety disorder, and PUD  She was admitted to hospital after she was found on the floor by EMS with  Nausea , vomiting and Diarrhea going on for more then a week.  In the ED her Potassium was 2.7 and Creat was 1.55. She was admitted for Dehydration and also had prolong QT interval. Her CT scan of abdomen showed Colitis. Her Stool was negative for C.Diff and Any other Organism. Her CT scan of head showed Atrophy and chronic small vessel ischemia no Acute process. Patient responded to IV fluids and Her QT interval Normalize with electrolytes repletion. She was discharged to facility for Therapy. She is doing well. Her Diarrhea has  stopped. She is c/o some swelling in her both her legs. Has some dry Cough but No chest pain , Fever or SOB. She lives by herself with a lady who comes and helps with her ADLS She has daughter who lives close to her. She is wheel chair bound at home. But is able  to transfer herself to bed.     Past Medical History:  Diagnosis Date  . Anemia   . Atrial fibrillation ([place])   . Back pain   . Bacterial pneumonia   . Cancer (HCC)    Skin   . Chronic respiratory failure ([place])   . COPD (chronic obstructive pulmonary disease) ([place])   . Depression   . Diverticulosis   . Fatty liver   . Fibromyalgia   . Gastric polyp 09/08/11   at the anastomosis-inflammatory  . Generalized anxiety disorder   . GERD (gastroesophageal reflux disease)   . 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 Hypercholesteremia   . Hypertension   . Insomnia   . Internal hemorrhoids   . On home O2    qhs  . Rheumatoid arteritis   . Ulcer    stomach  . Vitamin D deficiency    Past Surgical History:  Procedure Laterality Date  . ABDOMINAL HYSTERECTOMY    . ABDOMINAL SURGERY     removed patial stomach from ulcer  . COLONOSCOPY  04-2001   internal hemorrhoids, panocolonic diverticulosis, and colon polyps   . UPPER GASTROINTESTINAL ENDOSCOPY      reports that she has never  smoked. She has never used smokeless tobacco. She reports that she does not drink alcohol or use drugs. Social History   Social History  . Marital status: Widowed    Spouse name: N/A  . Number of children: 6  . Years of education: N/A   Occupational History  . Retired    Social History Main Topics  . Smoking status: Never Smoker  . Smokeless tobacco: Never Used  . Alcohol use No  . Drug use: No  . Sexual activity: Not on file   Other Topics Concern  . Not on file   Social History Narrative   Daily caffeine     Functional Status Survey:    Family History  Problem Relation Age of Onset  . Heart disease Father   .  Kidney disease Mother        kidney cancer  . Colon cancer Daughter 47    Health Maintenance  Topic Date Due  . Samul Dada  06/05/1947  . DEXA SCAN  06/06/1995  . PNA vac Low Risk Adult (1 of 2 - PCV13) 02/11/2017 (Originally 06/06/1995)  . INFLUENZA VACCINE  10/12/2016    Allergies  Allergen Reactions  . Other     Band Aids   . Tape   . Lipitor [Atorvastatin Calcium] Rash and Other (See Comments)    Muscle pain  . Niaspan [Niacin Er] Rash and Other (See Comments)    Muscle pain    Outpatient Encounter Prescriptions as of 08/01/2016  Medication Sig  . albuterol (PROAIR HFA) 108 (90 BASE) MCG/ACT inhaler Inhale 2 puffs into the lungs every 6 (six) hours as needed for wheezing or shortness of breath. For rescue   . apixaban (ELIQUIS) 2.5 MG TABS tablet Take 2.5 mg by mouth 2 (two) times daily.  Marland Kitchen CARTIA XT 300 MG 24 hr capsule TAKE ONE CAPSULE BY MOUTH EVERY DAY  . Cholecalciferol (VITAMIN D3) 1000 UNITS CAPS Take 1 capsule by mouth daily.  . cyanocobalamin 100 MCG tablet Take 100 mcg by mouth daily.  Marland Kitchen donepezil (ARICEPT) 5 MG tablet Take 5 mg by mouth at bedtime.  . DULoxetine (CYMBALTA) 60 MG capsule Take 60 mg by mouth daily.    . ferrous gluconate (FERGON) 325 MG tablet Take 325 mg by mouth daily.   . folic acid (FOLVITE) 1 MG tablet Take 1 mg by mouth daily.  Marland Kitchen LORazepam (ATIVAN) 0.5 MG tablet Take 0.25 mg by mouth 2 (two) times daily.  Marland Kitchen LYRICA 100 MG capsule Take 100 mg by mouth 3 (three) times daily.  . methotrexate (RHEUMATREX) 2.5 MG tablet Take 12.5 mg by mouth once a week. *Taken on Thursdays at 1200Caution:Chemotherapy. Protect from light.  . Multiple Vitamins-Minerals (MULTIVITAMIN WITH MINERALS) tablet Take 1 tablet by mouth daily.  Marland Kitchen oxyCODONE-acetaminophen (PERCOCET) 7.5-325 MG per tablet Take 1 tablet by mouth 4 (four) times daily as needed.  . pantoprazole (PROTONIX) 40 MG tablet Take 40 mg by mouth 2 (two) times daily.   . polyethylene glycol (MIRALAX /  GLYCOLAX) packet Take 17 g by mouth daily as needed for mild constipation or moderate constipation.   . predniSONE (DELTASONE) 5 MG tablet Take 2.5 mg by mouth daily.  . Probiotic Product (RISA-BID PROBIOTIC) TABS Give 1 tablet by mouth once a day  . raloxifene (EVISTA) 60 MG tablet Take 60 mg by mouth daily.  . rosuvastatin (CRESTOR) 20 MG tablet Take 20 mg by mouth daily.   . vitamin C (ASCORBIC ACID) 500 MG tablet Take 500  mg by mouth daily.  . [DISCONTINUED] Probiotic Product (PROBIOTIC FORMULA PO) Take 1 tablet by mouth daily.   . [DISCONTINUED] Tetrahydrozoline HCl (EYE DROPS OP) Place 1 drop into both eyes daily as needed (dry eyes). OTC   No facility-administered encounter medications on file as of 08/01/2016.      Review of Systems  Constitutional: Positive for fatigue. Negative for activity change, appetite change, chills, diaphoresis and fever.  HENT: Negative.   Respiratory: Positive for cough. Negative for apnea, choking, chest tightness, shortness of breath, wheezing and stridor.   Cardiovascular: Positive for leg swelling. Negative for chest pain and palpitations.  Gastrointestinal: Positive for abdominal pain. Negative for abdominal distention, constipation, diarrhea, nausea and vomiting.  Genitourinary: Negative.   Musculoskeletal: Positive for arthralgias, gait problem, joint swelling, myalgias and neck pain.  Skin: Positive for color change.  Neurological: Positive for tremors and weakness. Negative for dizziness.  Psychiatric/Behavioral: Positive for confusion, dysphoric mood and sleep disturbance. The patient is nervous/anxious.     Vitals:   08/01/16 1105  BP: (!) 142/85  Pulse: 80  Resp: 20  Temp: 98.1 F (36.7 C)  TempSrc: Oral   There is no height or weight on file to calculate BMI. Physical Exam  Constitutional: She is oriented to person, place, and time. She appears well-developed and well-nourished.  HENT:  Head: Normocephalic.  Mouth/Throat:  Oropharynx is clear and moist.  Eyes: Pupils are equal, round, and reactive to light.  Neck: Neck supple.  Cardiovascular: Normal rate and normal heart sounds.  An irregularly irregular rhythm present.  No murmur heard. Pulmonary/Chest: Effort normal. She has no wheezes.  Patient has fine crackles B/L Left more then right.  Abdominal: Soft. Bowel sounds are normal. She exhibits no distension. There is no tenderness. There is no rebound.  Musculoskeletal:  Moderate edema b/l  Neurological: She is alert and oriented to person, place, and time.  Has no focal deficit. 4/5 in both UE 3/5 in Both LE  Skin: Skin is warm and dry. There is erythema.  Psychiatric: She has a normal mood and affect. Her behavior is normal. Thought content normal.    Labs reviewed: Basic Metabolic Panel:  Recent Labs  07/26/16 0421 07/27/16 0509 07/28/16 0957 08/01/16 0615  NA 137 140 139 140  K 2.7* 3.1* 3.8 3.1*  CL 102 108 109 101  CO2 25 25 25  32  GLUCOSE 145* 118* 183* 153*  BUN 18 12 6 13   CREATININE 1.55* 1.23* 1.13* 1.20*  CALCIUM 9.2 8.7* 8.9 9.4  MG 2.0  --   --   --    Liver Function Tests:  Recent Labs  02/16/16 1039 07/26/16 0421 07/27/16 0509  AST 52* 23 20  ALT 33 18 15  ALKPHOS 51 57 52  BILITOT 0.7 0.8 0.6  PROT 6.2* 5.8* 5.3*  ALBUMIN 3.1* 2.9* 2.5*    Recent Labs  07/26/16 0421  LIPASE 19   No results for input(s): AMMONIA in the last 8760 hours. CBC:  Recent Labs  02/16/16 1039 07/26/16 0421 07/27/16 0509 08/01/16 0615  WBC 8.3 12.1* 7.3 8.2  NEUTROABS 5.3 7.9*  --   --   HGB 14.1 13.7 12.9 14.0  HCT 44.2 40.9 39.8 44.7  MCV 95.9 92.1 93.6 95.5  PLT 136* 181 147* 135*   Cardiac Enzymes:  Recent Labs  07/26/16 0421  CKTOTAL 39   BNP: Invalid input(s): POCBNP Lab Results  Component Value Date   HGBA1C 6.7 (H) 09/12/2014   Lab  Results  Component Value Date   TSH 2.992 07/26/2016   Lab Results  Component Value Date   YIFOYDXA12 878  09/12/2014   Lab Results  Component Value Date   FOLATE  04/15/2010    >20.0 (NOTE)  Reference Ranges        Deficient:       0.4 - 3.3 ng/mL        Indeterminate:   3.4 - 5.4 ng/mL        Normal:              > 5.4 ng/mL   Lab Results  Component Value Date   IRON 49 04/15/2010   TIBC 230 (L) 04/15/2010   FERRITIN 100 04/15/2010    Imaging and Procedures obtained prior to SNF admission: No results found.  Assessment/Plan  Colitis Symptoms are resolved. Patient still weak and not close to her baseline. Will work with therapy . Plan is for her to go home with help of her daughter.  Swelling of lower extremity Patient weighed 209 lbs initially and today she weighs 213 lbs Will start her on low dose of lasix for few days. It can be volume overload i due to IV fluids in hospital. Repeat labs and follow BUN and Creat  Chronic atrial fibrillation  Rate controlled on Cardizem On Eliquis  Acute renal Failure Creat back to baseline will follow Labs.  Rheumatoid arthritis Continue Methotrexate Also on Low dose Prednisone.  GERD Patient has some epigastric Pain and reflux especially at night.  Will continue Protonix BID.  Prolonged QT interval Resolved per Discharge Note Has some hypokalemia in labs will supplement  Peripheral polyneuropathy Continue Lyrica. Fibromyalgia Continue Cymbalta Anxiety On low dose ativan   Family/ staff Communication:   Labs/tests ordered: repeat BMP and CBC Total time spent in this patient care encounter was 45_ minutes; greater than 50% of the visit spent counseling patient and coordinating care for problems addressed at this encounter.

## 2016-08-04 ENCOUNTER — Encounter (HOSPITAL_COMMUNITY)
Admission: RE | Admit: 2016-08-04 | Discharge: 2016-08-04 | Disposition: A | Discharge status: Home / Self Care | Payer: Medicare Other | Source: Skilled Nursing Facility | Attending: Internal Medicine | Admitting: Internal Medicine

## 2016-08-04 DIAGNOSIS — F411 Generalized anxiety disorder: Secondary | ICD-10-CM | POA: Insufficient documentation

## 2016-08-04 DIAGNOSIS — F329 Major depressive disorder, single episode, unspecified: Secondary | ICD-10-CM | POA: Insufficient documentation

## 2016-08-04 DIAGNOSIS — D509 Iron deficiency anemia, unspecified: Secondary | ICD-10-CM | POA: Insufficient documentation

## 2016-08-04 DIAGNOSIS — E785 Hyperlipidemia, unspecified: Secondary | ICD-10-CM | POA: Insufficient documentation

## 2016-08-04 DIAGNOSIS — G9009 Other idiopathic peripheral autonomic neuropathy: Secondary | ICD-10-CM | POA: Insufficient documentation

## 2016-08-04 DIAGNOSIS — E559 Vitamin D deficiency, unspecified: Secondary | ICD-10-CM | POA: Insufficient documentation

## 2016-08-04 DIAGNOSIS — I10 Essential (primary) hypertension: Secondary | ICD-10-CM | POA: Insufficient documentation

## 2016-08-04 LAB — CBC WITH DIFFERENTIAL/PLATELET
BASOS ABS: 0 10*3/uL (ref 0.0–0.1)
BASOS PCT: 0 %
EOS PCT: 3 %
Eosinophils Absolute: 0.3 10*3/uL (ref 0.0–0.7)
HCT: 47.6 % — ABNORMAL HIGH (ref 36.0–46.0)
HEMOGLOBIN: 14.8 g/dL (ref 12.0–15.0)
LYMPHS ABS: 2.4 10*3/uL (ref 0.7–4.0)
LYMPHS PCT: 25 %
MCH: 30 pg (ref 26.0–34.0)
MCHC: 31.1 g/dL (ref 30.0–36.0)
MCV: 96.6 fL (ref 78.0–100.0)
Monocytes Absolute: 1 10*3/uL (ref 0.1–1.0)
Monocytes Relative: 10 %
Neutro Abs: 5.9 10*3/uL (ref 1.7–7.7)
Neutrophils Relative %: 62 %
PLATELETS: 130 10*3/uL — AB (ref 150–400)
RBC: 4.93 MIL/uL (ref 3.87–5.11)
RDW: 15.4 % (ref 11.5–15.5)
WBC: 9.6 10*3/uL (ref 4.0–10.5)

## 2016-08-04 LAB — BASIC METABOLIC PANEL
Anion gap: 8 (ref 5–15)
BUN: 15 mg/dL (ref 6–20)
CO2: 31 mmol/L (ref 22–32)
CREATININE: 1.22 mg/dL — AB (ref 0.44–1.00)
Calcium: 9.6 mg/dL (ref 8.9–10.3)
Chloride: 101 mmol/L (ref 101–111)
GFR, EST AFRICAN AMERICAN: 45 mL/min — AB (ref >60)
GFR, EST NON AFRICAN AMERICAN: 39 mL/min — AB (ref >60)
Glucose, Bld: 156 mg/dL — ABNORMAL HIGH (ref 65–99)
Potassium: 3.9 mmol/L (ref 3.5–5.1)
SODIUM: 140 mmol/L (ref 135–145)

## 2016-08-08 ENCOUNTER — Non-Acute Institutional Stay (SKILLED_NURSING_FACILITY): Payer: Medicare Other | Admitting: Internal Medicine

## 2016-08-08 ENCOUNTER — Encounter (HOSPITAL_COMMUNITY)
Admission: AD | Admit: 2016-08-08 | Discharge: 2016-08-08 | Disposition: A | Discharge status: Home / Self Care | Payer: Medicare Other | Source: Skilled Nursing Facility

## 2016-08-08 DIAGNOSIS — J209 Acute bronchitis, unspecified: Secondary | ICD-10-CM | POA: Diagnosis not present

## 2016-08-08 DIAGNOSIS — R059 Cough, unspecified: Secondary | ICD-10-CM

## 2016-08-08 DIAGNOSIS — R635 Abnormal weight gain: Secondary | ICD-10-CM

## 2016-08-08 DIAGNOSIS — J440 Chronic obstructive pulmonary disease with acute lower respiratory infection: Secondary | ICD-10-CM

## 2016-08-08 DIAGNOSIS — R05 Cough: Secondary | ICD-10-CM | POA: Diagnosis not present

## 2016-08-08 LAB — CBC WITH DIFFERENTIAL/PLATELET
BASOS ABS: 0.1 10*3/uL (ref 0.0–0.1)
BASOS PCT: 1 %
Eosinophils Absolute: 0.2 10*3/uL (ref 0.0–0.7)
Eosinophils Relative: 2 %
HEMATOCRIT: 43.5 % (ref 36.0–46.0)
HEMOGLOBIN: 13.6 g/dL (ref 12.0–15.0)
Lymphocytes Relative: 22 %
Lymphs Abs: 2.1 10*3/uL (ref 0.7–4.0)
MCH: 30.1 pg (ref 26.0–34.0)
MCHC: 31.3 g/dL (ref 30.0–36.0)
MCV: 96.2 fL (ref 78.0–100.0)
Monocytes Absolute: 0.7 10*3/uL (ref 0.1–1.0)
Monocytes Relative: 7 %
NEUTROS ABS: 6.4 10*3/uL (ref 1.7–7.7)
NEUTROS PCT: 68 %
Platelets: 132 10*3/uL — ABNORMAL LOW (ref 150–400)
RBC: 4.52 MIL/uL (ref 3.87–5.11)
RDW: 14.5 % (ref 11.5–15.5)
WBC: 9.5 10*3/uL (ref 4.0–10.5)

## 2016-08-08 LAB — BASIC METABOLIC PANEL
ANION GAP: 6 (ref 5–15)
BUN: 16 mg/dL (ref 6–20)
CHLORIDE: 100 mmol/L — AB (ref 101–111)
CO2: 32 mmol/L (ref 22–32)
Calcium: 9.9 mg/dL (ref 8.9–10.3)
Creatinine, Ser: 1.18 mg/dL — ABNORMAL HIGH (ref 0.44–1.00)
GFR calc non Af Amer: 41 mL/min — ABNORMAL LOW (ref >60)
GFR, EST AFRICAN AMERICAN: 47 mL/min — AB (ref >60)
Glucose, Bld: 197 mg/dL — ABNORMAL HIGH (ref 65–99)
POTASSIUM: 4.1 mmol/L (ref 3.5–5.1)
SODIUM: 138 mmol/L (ref 135–145)

## 2016-08-08 NOTE — Progress Notes (Signed)
This is an acute visit.  Level care skilled.  Facility is [facility].  Chief complaint acute visit secondary to productive cough-  History of present illness.  Patient is a pleasant 83 year old female here for rehabilitation after a hospitalization for hypokalemia dehydration prolonged QT interval and colitis.  She was treated with IV fluids her QT interval normalized with electrolyte replenishment-and has been doing well   She is complaining today however of productive  cough of thick yellow phlegm also is complaining of hemorrhoid pain  Last week Dr. Lyndel Safe did start her on a short course of Lasix as well as potassium secondary to some mild weight gain-she is completing a course of potassium Lasix is been completed her weight appears to be stabilized now at around 213 pounds.  She did have a cardiac echo done earlier this month which showed ejection fraction of 95-09% diastolic function apparently could not be assessed.  She does have a listed history COPD and continues onPro -Airl      Her vital signs are stable O2 saturation is 90% on room air  She tells me at home she actually had been on oxygen  She does not really complain of increased shortness of breath beyond baseline but is worried by her cough In apparently with exertion at times has increased shortness of breath Past Medical History:  Diagnosis Date  . Anemia   . Atrial fibrillation ([place])   . Back pain   . Bacterial pneumonia   . Cancer (HCC)    Skin   . Chronic respiratory failure ([place])   . COPD (chronic obstructive pulmonary disease) ([place])   . Depression   . Diverticulosis   . Fatty liver   . Fibromyalgia   . Gastric polyp 09/08/11   at the anastomosis-inflammatory  . Generalized anxiety disorder   . GERD (gastroesophageal reflux disease)   . 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 Hypercholesteremia   . Hypertension   . Insomnia   . Internal hemorrhoids   . On home O2     qhs  . Rheumatoid arteritis   . Ulcer    stomach  . Vitamin D deficiency         Past Surgical History:  Procedure Laterality Date  . ABDOMINAL HYSTERECTOMY    . ABDOMINAL SURGERY     removed patial stomach from ulcer  . COLONOSCOPY  04-2001   internal hemorrhoids, panocolonic diverticulosis, and colon polyps   . UPPER GASTROINTESTINAL ENDOSCOPY      reports that she has never smoked. She has never used smokeless tobacco. She reports that she does not drink alcohol or use drugs. Social History        Social History  . Marital status: Widowed    Spouse name: N/A  . Number of children: 6  . Years of education: N/A       Occupational History  . Retired        Social History Main Topics  . Smoking status: Never Smoker  . Smokeless tobacco: Never Used  . Alcohol use No  . Drug use: No  . Sexual activity: Not on file       Other Topics Concern  . Not on file      Social History Narrative   Daily caffeine     Functional Status Survey:  Family History  Problem Relation Age of Onset  . Heart disease Father   . Kidney disease Mother        kidney cancer  .  Colon cancer Daughter 59        Health Maintenance  Topic Date Due  . Samul Dada  06/05/1947  . DEXA SCAN  06/06/1995  . PNA vac Low Risk Adult (1 of 2 - PCV13) 02/11/2017 (Originally 06/06/1995)  . INFLUENZA VACCINE  10/12/2016         Allergies  Allergen Reactions  . Other     Band Aids   . Tape   . Lipitor [Atorvastatin Calcium] Rash and Other (See Comments)    Muscle pain  . Niaspan [Niacin Er] Rash and Other (See Comments)    Muscle pain        Outpatient Encounter Prescriptions as of 08/01/2016  Medication Sig  . albuterol (PROAIR HFA) 108 (90 BASE) MCG/ACT inhaler Inhale 2 puffs into the lungs every 6 (six) hours as needed for wheezing or shortness of breath. For rescue   . apixaban (ELIQUIS) 2.5 MG TABS tablet Take 2.5 mg by mouth 2 (two)  times daily.  Marland Kitchen CARTIA XT 300 MG 24 hr capsule TAKE ONE CAPSULE BY MOUTH EVERY DAY  . Cholecalciferol (VITAMIN D3) 1000 UNITS CAPS Take 1 capsule by mouth daily.  . cyanocobalamin 100 MCG tablet Take 100 mcg by mouth daily.  Marland Kitchen donepezil (ARICEPT) 5 MG tablet Take 5 mg by mouth at bedtime.  . DULoxetine (CYMBALTA) 60 MG capsule Take 60 mg by mouth daily.    . ferrous gluconate (FERGON) 325 MG tablet Take 325 mg by mouth daily.   . folic acid (FOLVITE) 1 MG tablet Take 1 mg by mouth daily.  Marland Kitchen LORazepam (ATIVAN) 0.5 MG tablet Take 0.25 mg by mouth 2 (two) times daily.  Marland Kitchen LYRICA 100 MG capsule Take 100 mg by mouth 3 (three) times daily.  . methotrexate (RHEUMATREX) 2.5 MG tablet Take 12.5 mg by mouth once a week. *Taken on Thursdays at 1200Caution:Chemotherapy. Protect from light.  . Multiple Vitamins-Minerals (MULTIVITAMIN WITH MINERALS) tablet Take 1 tablet by mouth daily.  Marland Kitchen oxyCODONE-acetaminophen (PERCOCET) 7.5-325 MG per tablet Take 1 tablet by mouth 4 (four) times daily as needed.  . pantoprazole (PROTONIX) 40 MG tablet Take 40 mg by mouth 2 (two) times daily.   . polyethylene glycol (MIRALAX / GLYCOLAX) packet Take 17 g by mouth daily as needed for mild constipation or moderate constipation.   . predniSONE (DELTASONE) 5 MG tablet Take 2.5 mg by mouth daily.  . Probiotic Product (RISA-BID PROBIOTIC) TABS Give 1 tablet by mouth once a day  . raloxifene (EVISTA) 60 MG tablet Take 60 mg by mouth daily.  . rosuvastatin (CRESTOR) 20 MG tablet Take 20 mg by mouth daily.   . vitamin C (ASCORBIC ACID) 500 MG tablet Take 500 mg by mouth daily.  . [DISCONTINUED] Probiotic Product (PROBIOTIC FORMULA PO) Take 1 tablet by mouth daily.   . [DISCONTINUED] Tetrahydrozoline HCl (EYE DROPS OP) Place 1 drop into both eyes daily as needed (dry eyes). OTC   No facility-administered encounter medications on file as of 08/01/2016.    Review of systems.  In general is not complaining of fever chills still  complains weakness.  Skin does not complain of rashes or itching.  Head ears eyes nose mouth and throat says occasionally she'll have an intermittent headache and sore throat but not persistent.  Respiratory does have a cough productive of yellow phlegm does not complain of shortness of breath beyond baseline says at one point she been on oxygen at home.  Cardiac does not complaining of chest pain or  palpitations continues with her baseline lower extremity edema.  GI is not complaining of nausea vomiting diarrhea constipation or abdominal discomfort.  Diarrhea apparently has resolved.  Musculoskeletal complains of joint weakness times in the pain she says is not new.  Neurologic is not complaining of dizziness or syncope says occasionally she will have headache.  Psych does have some history of nervousness and anxiety.    Physical exam.  Temperature 98.7 pulse 98 respirations 20 blood pressure 144/87-O2 saturation is 90% on room air.  In general this is a somewhat frail elderly female in no distress lying comfortably in bed.  Her skin is warm and dry.  Her oropharynx is clear mucous membranes moist.  Chest she has somewhat shallow air entry there is no labored breathing she has some  Crackles/wheezing expiratory on her lower lobes bilaterally   Heart is irregular irregular rate and rhythm without murmur gallop or rub  Abdomen is soft nontender with positive bowel sounds.  Musculoskeletal does have lower extremity weakness with moderate edema of her lower extremities bilaterally upper extremity strength bilaterally appears unremarkable.  Neurologic is grossly intact her speech is clear no lateralizing findings.  Psych she is alert and oriented pleasant and appropriate    Labs.  08/04/2016.  Sodium 140 potassium 3.9 BUN 15 creatinine 1.22.  WBC 9.6 hemoglobin 14.8 platelets 130,000.  Assessment and plan.  Cough productive of yellowish phlegm-Will start Mucinex  600 mg twice a day for 7 days-also will order a 2 view chest x-ray.  Also will increase her prednisone slightly secondary to some mild wheezing with her history COPD-will give her prednisone 20 mg by mouth daily for 2 days then 10 mg daily for 2 days--I'd base by mouth daily 2 days and then back to her baseline 2.5 mg of prednisone which she is on with her history rheumatoid arthritis.  Marcelina Morel will order Atrovent nebulizers routine every 6 hours 48 hours and when necessary for 5 additional days  Also will update a CBC with differential and metabolic panel  Also monitor vital signs pulse ox every 4 hours 2 and then every shift keep an eye on her status.  #2 weight gain this appears to have moderated she has finished a short course of Lasix is finishing a course of potassium supplementation Will update a metabolic panel as well keep an eye on her electrolytes.  [id number]

## 2016-08-18 ENCOUNTER — Other Ambulatory Visit: Payer: Self-pay | Admitting: *Deleted

## 2016-08-18 MED ORDER — OXYCODONE-ACETAMINOPHEN 7.5-325 MG PO TABS
ORAL_TABLET | ORAL | 0 refills | Status: DC
Start: 2016-08-18 — End: 2017-04-03

## 2016-08-18 NOTE — Telephone Encounter (Signed)
[facility] #[phone] Fax: [phone]   

## 2016-08-26 ENCOUNTER — Other Ambulatory Visit (HOSPITAL_COMMUNITY)
Admission: RE | Admit: 2016-08-26 | Discharge: 2016-08-26 | Disposition: A | Discharge status: Home / Self Care | Payer: Medicare Other | Source: Skilled Nursing Facility | Attending: Internal Medicine | Admitting: Internal Medicine

## 2016-08-26 ENCOUNTER — Non-Acute Institutional Stay (SKILLED_NURSING_FACILITY): Payer: Medicare Other | Admitting: Internal Medicine

## 2016-08-26 ENCOUNTER — Encounter: Payer: Self-pay | Admitting: Internal Medicine

## 2016-08-26 DIAGNOSIS — I10 Essential (primary) hypertension: Secondary | ICD-10-CM | POA: Insufficient documentation

## 2016-08-26 DIAGNOSIS — I482 Chronic atrial fibrillation, unspecified: Secondary | ICD-10-CM

## 2016-08-26 DIAGNOSIS — E876 Hypokalemia: Secondary | ICD-10-CM | POA: Diagnosis not present

## 2016-08-26 DIAGNOSIS — N179 Acute kidney failure, unspecified: Secondary | ICD-10-CM

## 2016-08-26 DIAGNOSIS — I00 Rheumatic fever without heart involvement: Secondary | ICD-10-CM | POA: Diagnosis not present

## 2016-08-26 DIAGNOSIS — J449 Chronic obstructive pulmonary disease, unspecified: Secondary | ICD-10-CM

## 2016-08-26 DIAGNOSIS — M0520 Rheumatoid vasculitis with rheumatoid arthritis of unspecified site: Secondary | ICD-10-CM

## 2016-08-26 LAB — CBC WITH DIFFERENTIAL/PLATELET
BASOS ABS: 0 10*3/uL (ref 0.0–0.1)
Basophils Relative: 0 %
EOS PCT: 2 %
Eosinophils Absolute: 0.1 10*3/uL (ref 0.0–0.7)
HCT: 42.8 % (ref 36.0–46.0)
Hemoglobin: 13.5 g/dL (ref 12.0–15.0)
Lymphocytes Relative: 19 %
Lymphs Abs: 1.6 10*3/uL (ref 0.7–4.0)
MCH: 30.2 pg (ref 26.0–34.0)
MCHC: 31.5 g/dL (ref 30.0–36.0)
MCV: 95.7 fL (ref 78.0–100.0)
Monocytes Absolute: 0.6 10*3/uL (ref 0.1–1.0)
Monocytes Relative: 7 %
Neutro Abs: 5.9 10*3/uL (ref 1.7–7.7)
Neutrophils Relative %: 72 %
PLATELETS: 129 10*3/uL — AB (ref 150–400)
RBC: 4.47 MIL/uL (ref 3.87–5.11)
RDW: 15.2 % (ref 11.5–15.5)
WBC: 8.2 10*3/uL (ref 4.0–10.5)

## 2016-08-26 LAB — BASIC METABOLIC PANEL
ANION GAP: 7 (ref 5–15)
BUN: 21 mg/dL — AB (ref 6–20)
CO2: 32 mmol/L (ref 22–32)
Calcium: 9.8 mg/dL (ref 8.9–10.3)
Chloride: 102 mmol/L (ref 101–111)
Creatinine, Ser: 1.32 mg/dL — ABNORMAL HIGH (ref 0.44–1.00)
GFR calc Af Amer: 41 mL/min — ABNORMAL LOW (ref >60)
GFR, EST NON AFRICAN AMERICAN: 35 mL/min — AB (ref >60)
GLUCOSE: 277 mg/dL — AB (ref 65–99)
Potassium: 3.9 mmol/L (ref 3.5–5.1)
Sodium: 141 mmol/L (ref 135–145)

## 2016-08-26 NOTE — Progress Notes (Signed)
Location:   [facility] Room Number: 130/P Place of Service:  SNF (31)  Provider: Alene Mires.Oscar La  PCP: Celene Squibb, MD Patient Care Team: Celene Squibb, MD as PCP - General  Extended Emergency Contact Information Primary Emergency Contact: Nance,Sheila Address: [address]          [place], [place] [address] [place] Phone: [phone] Relation: Daughter Secondary Emergency Contact: Charlynne Pander, [place] [address] [place] Phone: [phone] Relation: Grandaughter  Code Status: Full Code Goals of care:  Advanced Directive information Advanced Directives 08/26/2016  Does Patient Have a Medical Advance Directive? Yes  Type of Advance Directive (No Data)  Does patient want to make changes to medical advance directive? No - Patient declined  Copy of [place] in Chart? -  Pre-existing out of facility DNR order (yellow form or pink MOST form) -     Allergies  Allergen Reactions  . Other     Band Aids   . Tape   . Lipitor [Atorvastatin Calcium] Rash and Other (See Comments)    Muscle pain  . Niaspan [Niacin Er] Rash and Other (See Comments)    Muscle pain    Chief Complaint  Patient presents with  . Discharge Note    HPI:  83 y.o. female  seen today for discharge from facility on Monday, June 18.  She has multiple medical problems including atrial fibrillation rheumatoid arthritis-COPD-fibromyalgia-neuropathy-hyperlipidemia-anxiety-and peptic ulcer disease.  She was admitted the hospital after being found on the floor had nausea vomiting diarrhea.  Her potassium was noted to be 2.7 creatinine was 1.55-she had a prolonged QT interval.  CT scan showed colitis negative C. difficile culture.  CT of the head showed atrophy and chronic small vessel ischemia no acute process.  She received IV fluids are QT interval normalized with electrolyte replacement---she was here for  therapy and has done relatively well continues to be quite weak.  She continues to complain of a cough we did do a chest x-ray recently which showed possible bronchitis-she was treated with nebulizers and Mucinex she says the cough is still somewhat persistent but is not gotten any worse apparently ebbs and flows.  She will be going to an assisted living facility previously lived by herself with someone would come in and help with her activities of daily living.  She continues to be wheelchair bound but is able to transfer to bed apparently.  Her other medical issues appear to be relatively stable-A. fib appears to be controlled on Cardizem she is on Eliquis for anticoagulation.  She does continue at times a somewhat diffuse joint complaints or so her right hand the day she does have a history of neuropathy on Lyrica as well as fibromyalgia on Cymbalta-in addition to rheumatoid arthritis she is on methotrexate once a week as well as low-dose prednisone.  She did receive a short course of Lasix for some increased edema-this has since been discontinued she is also no longer on potassium supplementation as Lasix was discontinued.  We will update this before discharge    Past Medical History:  Diagnosis Date  . Anemia   . Atrial fibrillation ([place])   . Back pain   . Bacterial pneumonia   . Cancer (HCC)    Skin   . Chronic respiratory failure ([place])   . COPD (chronic obstructive pulmonary disease) ([place])   . Depression   .  Diverticulosis   . Fatty liver   . Fibromyalgia   . Gastric polyp 09/08/11   at the anastomosis-inflammatory  . Generalized anxiety disorder   . GERD (gastroesophageal reflux disease)   . 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 Hypercholesteremia   . Hypertension   . Insomnia   . Internal hemorrhoids   . On home O2    qhs  . Rheumatoid arteritis   . Ulcer    stomach  . Vitamin D deficiency     Past Surgical History:  Procedure Laterality Date  .  ABDOMINAL HYSTERECTOMY    . ABDOMINAL SURGERY     removed patial stomach from ulcer  . COLONOSCOPY  04-2001   internal hemorrhoids, panocolonic diverticulosis, and colon polyps   . UPPER GASTROINTESTINAL ENDOSCOPY        reports that she has never smoked. She has never used smokeless tobacco. She reports that she does not drink alcohol or use drugs. Social History   Social History  . Marital status: Widowed    Spouse name: N/A  . Number of children: 6  . Years of education: N/A   Occupational History  . Retired    Social History Main Topics  . Smoking status: Never Smoker  . Smokeless tobacco: Never Used  . Alcohol use No  . Drug use: No  . Sexual activity: Not on file   Other Topics Concern  . Not on file   Social History Narrative   Daily caffeine    Functional Status Survey:    Allergies  Allergen Reactions  . Other     Band Aids   . Tape   . Lipitor [Atorvastatin Calcium] Rash and Other (See Comments)    Muscle pain  . Niaspan [Niacin Er] Rash and Other (See Comments)    Muscle pain    Pertinent  Health Maintenance Due  Topic Date Due  . DEXA SCAN  06/06/1995  . PNA vac Low Risk Adult (1 of 2 - PCV13) 02/11/2017 (Originally 06/06/1995)  . INFLUENZA VACCINE  10/12/2016    Medications: Outpatient Encounter Prescriptions as of 08/26/2016  Medication Sig  . albuterol (PROAIR HFA) 108 (90 BASE) MCG/ACT inhaler Inhale 2 puffs into the lungs every 6 (six) hours as needed for wheezing or shortness of breath. For rescue   . apixaban (ELIQUIS) 2.5 MG TABS tablet Take 2.5 mg by mouth 2 (two) times daily.  Marland Kitchen CARTIA XT 300 MG 24 hr capsule TAKE ONE CAPSULE BY MOUTH EVERY DAY  . Cholecalciferol (VITAMIN D3) 1000 UNITS CAPS Take 1 capsule by mouth daily.  . cyanocobalamin 100 MCG tablet Take 100 mcg by mouth daily.  Marland Kitchen donepezil (ARICEPT) 5 MG tablet Take 5 mg by mouth at bedtime.  . DULoxetine (CYMBALTA) 60 MG capsule Take 60 mg by mouth daily.    . ferrous gluconate  (FERGON) 325 MG tablet Take 325 mg by mouth daily.   . folic acid (FOLVITE) 1 MG tablet Take 1 mg by mouth daily.  Marland Kitchen LORazepam (ATIVAN) 0.5 MG tablet Take 0.25 mg by mouth 2 (two) times daily.  Marland Kitchen LYRICA 100 MG capsule Take 100 mg by mouth 3 (three) times daily.  . methotrexate (RHEUMATREX) 2.5 MG tablet Take 12.5 mg by mouth once a week. *Taken on Thursdays at 1200Caution:Chemotherapy. Protect from light.  . Multiple Vitamins-Minerals (MULTIVITAMIN WITH MINERALS) tablet Take 1 tablet by mouth daily.  Marland Kitchen oxyCODONE-acetaminophen (PERCOCET) 7.5-325 MG tablet Take one tablet by mouth every 4 hours as needed for pain.  Max APAP 3gm/24hrs  . pantoprazole (PROTONIX) 40 MG tablet Take 40 mg by mouth 2 (two) times daily.   . phenylephrine-shark liver oil-mineral oil-petrolatum (PREPARATION H) 0.25-3-14-71.9 % rectal ointment Place 1 application rectally 2 (two) times daily as needed for hemorrhoids.  . polyethylene glycol (MIRALAX / GLYCOLAX) packet Take 17 g by mouth daily as needed for mild constipation or moderate constipation.   . predniSONE (DELTASONE) 5 MG tablet Take 2.5 mg by mouth daily.  . Probiotic Product (RISA-BID PROBIOTIC) TABS Give 1 tablet by mouth once a day  . raloxifene (EVISTA) 60 MG tablet Take 60 mg by mouth daily.  . rosuvastatin (CRESTOR) 20 MG tablet Take 20 mg by mouth daily.   . vitamin C (ASCORBIC ACID) 500 MG tablet Take 500 mg by mouth daily.   No facility-administered encounter medications on file as of 08/26/2016.      Review of Systems  In general is not complaining of fever chills .  Skin does not complain of rashes or itching.  Head ears eyes nose mouth and throat does not complain of sore throat or visual changes  Respiratory does have a cough  But is not complaining of increased shortness of breath she does have a history COPD  Cardiac does not complaining of chest pain or palpitations continues with her baseline lower extremity edema.  GI is not  complaining of nausea vomiting diarrhea constipation or abdominal discomfort.  Diarrhea has resolved.  Musculoskeletal complains of joint weakness times in the pain she says is not new. He is complaining of some right hand discomfort denies any trauma  Neurologic is not complaining of dizziness or syncope says occasionally she will have headache.  Psych does have some history of nervousness and anxiety. This appears to be controlled recently    She is afebrile pulse of 80 respirations 20 blood pressure recently noted 120/75-143/72-120/81 Physical Exam In general this is a somewhat frail elderly female in no distress lying comfortably in her Geri chair  Her skin is warm and dry.  Her oropharynx is clear mucous membranes moist.  Chest she has somewhat shallow air entry there is no labored breathing or overt congestion  Heart is irregular irregular rate and rhythm without murmur gallop or rub--continues with chronic lower extremity edema which appears relatively unchanged  Abdomen is soft  obese nontender with positive bowel sounds.  Musculoskeletal does have lower extremity weakness with moderate edema of her lower extremities bilaterally upper extremity strength bilaterally appears unremarkable. Her strength appears to be strong bilaterally I could not really note any acute tenderness to palpation of the right hand or deformity-radial pulses intact.    Neurologic is grossly intact her speech is clear no lateralizing findings.  Psych she is alert and oriented pleasant and appropriate  Labs reviewed: Basic Metabolic Panel:  Recent Labs  07/26/16 0421  08/01/16 0615 08/04/16 0722 08/08/16 1420  NA 137  < > 140 140 138  K 2.7*  < > 3.1* 3.9 4.1  CL 102  < > 101 101 100*  CO2 25  < > 32 31 32  GLUCOSE 145*  < > 153* 156* 197*  BUN 18  < > 13 15 16   CREATININE 1.55*  < > 1.20* 1.22* 1.18*  CALCIUM 9.2  < > 9.4 9.6 9.9  MG 2.0  --   --   --   --   < > =  values in this interval not displayed. Liver Function Tests:  Recent Labs  02/16/16 1039 07/26/16 0421 07/27/16 0509  AST 52* 23 20  ALT 33 18 15  ALKPHOS 51 57 52  BILITOT 0.7 0.8 0.6  PROT 6.2* 5.8* 5.3*  ALBUMIN 3.1* 2.9* 2.5*    Recent Labs  07/26/16 0421  LIPASE 19   No results for input(s): AMMONIA in the last 8760 hours. CBC:  Recent Labs  07/26/16 0421  08/01/16 0615 08/04/16 0722 08/08/16 1420  WBC 12.1*  < > 8.2 9.6 9.5  NEUTROABS 7.9*  --   --  5.9 6.4  HGB 13.7  < > 14.0 14.8 13.6  HCT 40.9  < > 44.7 47.6* 43.5  MCV 92.1  < > 95.5 96.6 96.2  PLT 181  < > 135* 130* 132*  < > = values in this interval not displayed. Cardiac Enzymes:  Recent Labs  07/26/16 0421  CKTOTAL 39   BNP: Invalid input(s): POCBNP CBG: No results for input(s): GLUCAP in the last 8760 hours.  Procedures and Imaging Studies During Stay: No results found.  Assessment/Plan:    #1-history COPD she continues on proton where and low-dose prednisone continues to complain of a cough will restart Atrovent nebulizers every 6 hours routine for 48 hours and then when necessary-also will add Mucinex 60 mg twice a day for 5 days-and obtain a two-view chest x-ray to follow up here.  #2 history of atrial fibrillation this appears rate controlled on Cardizem she is on Eliquis for anticoagulation.  #3 history of rheumatoid arthritis continues on methotrexate every weekly and low-dose prednisone-this appears relatively stable.  #4 history of fibromyalgia she is on Cymbalta.  #5 history of neuropathy-continues on Lyrica.  #6 history of anxiety she is on Ativan this appears to be stable she receives this twice a day.  #7 history of GERD-peptic ulcer disease she is on Protonix this has been fairly asymptomatic during her stay here.  #8 history of colitis this appears to be essentially resolved diarrhea resolved she is not complaining of abdominal discomfort says she has a pretty decent  appetite.  #9 history of acute renal failure this is stabilized during her stay here with a creatinine of 1.180 most recent lab in late May we will update this especially in light of a history of hypokalemia in the past although this appears to have normalized as well.  #10 history of prolonged QT interval and this was thought to be electrolyte related which was replenished.  #11 history of mild thrombocytopenia and last platelet count 132,000 relatively baseline Will update this before discharge as well.  #12 history of right hand discomfort will order an x-ray did not see any acute abnormalities she does at times have somewhat generalized complaints of pain Will check an x-ray-I could not really appreciate evidence of a rheumatoid arthritis flare this will bear follow-up if pain persists.  She will be going into an assisted living facility.  Again will obtain a chest x-ray add nebulizers and Mucinex for now regards respiratory issues-also will update a CBC and metabolic panel before discharge.  [id number] note greater than 30 minutes spent on this discharge summary greater than 50% of time spent coordinating plan of care for numerous diagnoses

## 2016-10-21 ENCOUNTER — Emergency Department (HOSPITAL_COMMUNITY)
Admission: EM | Admit: 2016-10-21 | Discharge: 2016-10-22 | Disposition: A | Discharge status: Skilled Nursing Facility | Payer: Medicare Other | Attending: Emergency Medicine | Admitting: Emergency Medicine

## 2016-10-21 ENCOUNTER — Encounter (HOSPITAL_COMMUNITY): Payer: Self-pay | Admitting: Emergency Medicine

## 2016-10-21 DIAGNOSIS — Y999 Unspecified external cause status: Secondary | ICD-10-CM | POA: Diagnosis not present

## 2016-10-21 DIAGNOSIS — Z79899 Other long term (current) drug therapy: Secondary | ICD-10-CM | POA: Diagnosis not present

## 2016-10-21 DIAGNOSIS — Y9389 Activity, other specified: Secondary | ICD-10-CM | POA: Diagnosis not present

## 2016-10-21 DIAGNOSIS — I129 Hypertensive chronic kidney disease with stage 1 through stage 4 chronic kidney disease, or unspecified chronic kidney disease: Secondary | ICD-10-CM | POA: Diagnosis not present

## 2016-10-21 DIAGNOSIS — N189 Chronic kidney disease, unspecified: Secondary | ICD-10-CM | POA: Diagnosis not present

## 2016-10-21 DIAGNOSIS — W230XXA Caught, crushed, jammed, or pinched between moving objects, initial encounter: Secondary | ICD-10-CM | POA: Diagnosis not present

## 2016-10-21 DIAGNOSIS — S61419A Laceration without foreign body of unspecified hand, initial encounter: Secondary | ICD-10-CM | POA: Diagnosis not present

## 2016-10-21 DIAGNOSIS — Z23 Encounter for immunization: Secondary | ICD-10-CM | POA: Diagnosis not present

## 2016-10-21 DIAGNOSIS — J449 Chronic obstructive pulmonary disease, unspecified: Secondary | ICD-10-CM | POA: Insufficient documentation

## 2016-10-21 DIAGNOSIS — Y92129 Unspecified place in nursing home as the place of occurrence of the external cause: Secondary | ICD-10-CM | POA: Diagnosis not present

## 2016-10-21 DIAGNOSIS — Z7901 Long term (current) use of anticoagulants: Secondary | ICD-10-CM | POA: Insufficient documentation

## 2016-10-21 DIAGNOSIS — Z85828 Personal history of other malignant neoplasm of skin: Secondary | ICD-10-CM | POA: Insufficient documentation

## 2016-10-21 DIAGNOSIS — S6991XA Unspecified injury of right wrist, hand and finger(s), initial encounter: Secondary | ICD-10-CM | POA: Diagnosis present

## 2016-10-21 MED ORDER — TETANUS-DIPHTH-ACELL PERTUSSIS 5-2.5-18.5 LF-MCG/0.5 IM SUSP
0.5000 mL | Freq: Once | INTRAMUSCULAR | Status: AC
  Administered 2016-10-21: 0.5 mL via INTRAMUSCULAR

## 2016-10-21 MED ORDER — TETANUS-DIPHTH-ACELL PERTUSSIS 5-2.5-18.5 LF-MCG/0.5 IM SUSP
INTRAMUSCULAR | Status: AC
  Administered 2016-10-21: 0.5 mL via INTRAMUSCULAR
  Filled 2016-10-21: qty 0.5

## 2016-10-21 NOTE — ED Triage Notes (Signed)
Pt c/o laceration that will not quit bleeding. Pt states her right hand got caught between wheelchair and ramp rail.

## 2016-10-21 NOTE — ED Triage Notes (Signed)
Pt was in a practice fire drill today Her R Hand was hit and is not bruised and has a tear   Pt is on Blood thinner  Unknown DT  Dr Nevada Crane PCP

## 2016-10-21 NOTE — Discharge Instructions (Signed)
Keep you hand clean and dry and try to avoid full flexion of your finger (making a fist) to help reduce the stress on these wounds as they heal. Get rechecked for any signs of infection (redness, drainage, swelling or worsened pain).

## 2016-10-21 NOTE — ED Provider Notes (Signed)
[facility] DEPT Provider Note   CSN: [id number] Arrival date & time: 10/21/16  2209     History   Chief Complaint Chief Complaint  Patient presents with  . Hand Pain    HPI [name] is a 83 y.o. 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  The wound was cleaned and dressed but she continues to have bleeding from the site.  She is unsure of her tetanus status.  She denies pain, numbness or decreased ability to flex/ext her fingers or wrist.   The history is provided by the patient and a relative.    Past Medical History:  Diagnosis Date  . Anemia   . Atrial fibrillation ([place])   . Back pain   . Bacterial pneumonia   . Cancer (HCC)    Skin   . Chronic respiratory failure ([place])   . COPD (chronic obstructive pulmonary disease) ([place])   . Depression   . Diverticulosis   . Fatty liver   . Fibromyalgia   . Gastric polyp 09/08/11   at the anastomosis-inflammatory  . Generalized anxiety disorder   . GERD (gastroesophageal reflux disease)   . 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 Hypercholesteremia   . Hypertension   . Insomnia   . Internal hemorrhoids   . On home O2    qhs  . Rheumatoid arteritis   . Ulcer    stomach  . Vitamin D deficiency     Patient Active Problem List   Diagnosis Date Noted  . Peripheral neuropathy 08/01/2016  . AKI (acute kidney injury) ([place]) 07/26/2016  . Atrial fibrillation ([place]) 07/26/2016  . Hypertension 07/26/2016  . Acute gastroenteritis 07/26/2016  . Altered mental status 07/26/2016  . Prolonged QT interval 07/26/2016  . Syncope 09/12/2014  . CKD (chronic kidney disease) 09/12/2014  . Hyperglycemia 09/12/2014  . Hypoxia 09/12/2014  . Acute encephalopathy 09/12/2014  . Chest pain 07/21/2013  . Chronic respiratory failure ([place]) 07/21/2013  . Hypokalemia 07/21/2013  . Atrial  fibrillation with RVR ([place]) 07/21/2013  . Knee pain 05/21/2013  . Fibromyalgia   . COPD (chronic obstructive pulmonary disease) ([place])   . 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 GERD (gastroesophageal reflux disease)   . Rheumatoid arteritis   . Back pain   . Fracture of distal fibula 09/20/2010    Past Surgical History:  Procedure Laterality Date  . ABDOMINAL HYSTERECTOMY    . ABDOMINAL SURGERY     removed patial stomach from ulcer  . COLONOSCOPY  04-2001   internal hemorrhoids, panocolonic diverticulosis, and colon polyps   . UPPER GASTROINTESTINAL ENDOSCOPY      OB History    No data available       Home Medications    Prior to Admission medications   Medication Sig Start Date End Date Taking? Authorizing Provider  albuterol (PROAIR HFA) 108 (90 BASE) MCG/ACT inhaler Inhale 2 puffs into the lungs every 6 (six) hours as needed for wheezing or shortness of breath. For rescue     [provider]  apixaban (ELIQUIS) 2.5 MG TABS tablet Take 2.5 mg by mouth 2 (two) times daily.    [provider]  CARTIA XT 300 MG 24 hr capsule TAKE ONE CAPSULE BY MOUTH EVERY DAY 09/25/14   Arnoldo Lenis, MD  Cholecalciferol (VITAMIN D3) 1000 UNITS CAPS Take 1 capsule by mouth  daily.    [provider]  cyanocobalamin 100 MCG tablet Take 100 mcg by mouth daily.    [provider]  donepezil (ARICEPT) 5 MG tablet Take 5 mg by mouth at bedtime. 05/06/16   [provider]  DULoxetine (CYMBALTA) 60 MG capsule Take 60 mg by mouth daily.      [provider]  ferrous gluconate (FERGON) 325 MG tablet Take 325 mg by mouth daily.     [provider]  folic acid (FOLVITE) 1 MG tablet Take 1 mg by mouth daily.    [provider]  LORazepam (ATIVAN) 0.5 MG tablet Take 0.25 mg by mouth 2 (two) times daily.    [provider]  LYRICA 100 MG capsule Take 100 mg by mouth 3 (three) times daily. 12/07/15   [provider]  methotrexate (RHEUMATREX) 2.5 MG tablet Take 12.5 mg by mouth once a week. *Taken on Thursdays at 1200Caution:Chemotherapy. Protect from light.    [provider]  Multiple Vitamins-Minerals (MULTIVITAMIN WITH MINERALS) tablet Take 1 tablet by mouth daily.    [provider]  oxyCODONE-acetaminophen (PERCOCET) 7.5-325 MG tablet Take one tablet by mouth every 4 hours as needed for pain. Max APAP 3gm/24hrs 08/18/16   Reed, Tiffany L, DO  pantoprazole (PROTONIX) 40 MG tablet Take 40 mg by mouth 2 (two) times daily.     [provider]  phenylephrine-shark liver oil-mineral oil-petrolatum (PREPARATION H) 0.25-3-14-71.9 % rectal ointment Place 1 application rectally 2 (two) times daily as needed for hemorrhoids.    [provider]  polyethylene glycol (MIRALAX / GLYCOLAX) packet Take 17 g by mouth daily as needed for mild constipation or moderate constipation.     [provider]  predniSONE (DELTASONE) 5 MG tablet Take 2.5 mg by mouth daily.    [provider]  Probiotic Product (RISA-BID PROBIOTIC) TABS Give 1 tablet by mouth once a day    [provider]  raloxifene (EVISTA) 60 MG tablet Take 60 mg by mouth daily.    [provider]  rosuvastatin (CRESTOR) 20 MG tablet Take 20 mg by mouth daily.     [provider]  vitamin C (ASCORBIC ACID) 500 MG tablet Take 500 mg by mouth daily.    [provider]    Family History Family History  Problem Relation Age of Onset  . Heart disease Father   . Kidney disease Mother        kidney cancer  . Colon cancer Daughter 58    Social History Social History  Substance Use Topics  . Smoking status: Never Smoker  . Smokeless tobacco: Never Used  . Alcohol use No     Allergies   Other; Tape; Lipitor [atorvastatin calcium]; and Niaspan [niacin er]   Review of Systems Review of Systems  Constitutional: Negative for chills and fever.  Musculoskeletal:  Negative.  Negative for arthralgias.  Skin: Positive for wound.  Neurological: Negative for numbness.     Physical Exam Updated Vital Signs BP 121/60   Pulse 80   Temp 98.3 F (36.8 C)   Resp 20   Wt 97.5 kg (215 lb)   SpO2 93%   BMI 39.32 kg/m   Physical Exam  Constitutional: She is oriented to person, place, and time. She appears well-developed and well-nourished.  HENT:  Head: Normocephalic.  Cardiovascular: Normal rate.   Pulmonary/Chest: Effort normal.  Musculoskeletal: She exhibits no tenderness.  Hand nontender. Distal sensation intact with FROM of fingers  and wrist.  No deformity. Less than 2 sec cap refill in fingers.  Neurological: She is alert and oriented to person, place, and time. No sensory deficit.  Skin: Laceration noted.  3 small superficial skin tears right dorsal hand, hemostatic.     ED Treatments / Results  Labs (all labs ordered are listed, but only abnormal results are displayed) Labs Reviewed - No data to display  EKG  EKG Interpretation None       Radiology No results found.  Procedures Procedures (including critical care time)  LACERATION REPAIR Performed by: Evalee Jefferson Authorized by: Evalee Jefferson Consent: Verbal consent obtained. Risks and benefits: risks, benefits and alternatives were discussed Consent given by: patient Patient identity confirmed: provided demographic data Prepped and Draped in normal sterile fashion Wound explored  Laceration Location: right hand  Laceration Length: 3 near linear skin tears, each approx 1 cm  No Foreign Bodies seen or palpated  Anesthesia: none  Local anesthetic: none Anesthetic total: none  Irrigation method: syringe using saline after betadine wash Amount of cleaning: standard  Skin closure: sterile strips  Number of sutures: strips Technique: strips  Patient tolerance: Patient tolerated the procedure well with no immediate complications.   Medications Ordered in  ED Medications  Tdap (BOOSTRIX) injection 0.5 mL (not administered)     Initial Impression / Assessment and Plan / ED Course  I have reviewed the triage vital signs and the nursing notes.  Pertinent labs & imaging results that were available during my care of the patient were reviewed by me and considered in my medical decision making (see chart for details).     Dressing applied.  Prn f/u anticipated. Tetanus updated.  Final Clinical Impressions(s) / ED Diagnoses   Final diagnoses:  Skin tear of hand without complication, initial encounter    New Prescriptions New Prescriptions   No medications on file     Landis Martins 10/21/16 2343    Ezequiel Essex, MD 10/22/16 [phone]

## 2016-10-25 ENCOUNTER — Emergency Department (HOSPITAL_COMMUNITY)
Admission: EM | Admit: 2016-10-25 | Discharge: 2016-10-26 | Disposition: A | Discharge status: Home / Self Care | Payer: Medicare Other | Attending: Emergency Medicine | Admitting: Emergency Medicine

## 2016-10-25 ENCOUNTER — Emergency Department (HOSPITAL_COMMUNITY): Payer: Medicare Other

## 2016-10-25 ENCOUNTER — Encounter (HOSPITAL_COMMUNITY): Payer: Self-pay | Admitting: *Deleted

## 2016-10-25 DIAGNOSIS — I129 Hypertensive chronic kidney disease with stage 1 through stage 4 chronic kidney disease, or unspecified chronic kidney disease: Secondary | ICD-10-CM | POA: Diagnosis not present

## 2016-10-25 DIAGNOSIS — Z7901 Long term (current) use of anticoagulants: Secondary | ICD-10-CM | POA: Diagnosis not present

## 2016-10-25 DIAGNOSIS — N189 Chronic kidney disease, unspecified: Secondary | ICD-10-CM | POA: Insufficient documentation

## 2016-10-25 DIAGNOSIS — M545 Low back pain, unspecified: Secondary | ICD-10-CM

## 2016-10-25 DIAGNOSIS — Z85828 Personal history of other malignant neoplasm of skin: Secondary | ICD-10-CM | POA: Diagnosis not present

## 2016-10-25 DIAGNOSIS — J449 Chronic obstructive pulmonary disease, unspecified: Secondary | ICD-10-CM | POA: Diagnosis not present

## 2016-10-25 HISTORY — DX: Age-related osteoporosis without current pathological fracture: M81.0

## 2016-10-25 HISTORY — DX: Unspecified dementia, unspecified severity, without behavioral disturbance, psychotic disturbance, mood disturbance, and anxiety: F03.90

## 2016-10-25 LAB — COMPREHENSIVE METABOLIC PANEL
ALBUMIN: 3.1 g/dL — AB (ref 3.5–5.0)
ALT: 39 U/L (ref 14–54)
AST: 47 U/L — AB (ref 15–41)
Alkaline Phosphatase: 75 U/L (ref 38–126)
Anion gap: 6 (ref 5–15)
BUN: 18 mg/dL (ref 6–20)
CHLORIDE: 102 mmol/L (ref 101–111)
CO2: 31 mmol/L (ref 22–32)
CREATININE: 1.18 mg/dL — AB (ref 0.44–1.00)
Calcium: 9.4 mg/dL (ref 8.9–10.3)
GFR calc Af Amer: 47 mL/min — ABNORMAL LOW (ref >60)
GFR, EST NON AFRICAN AMERICAN: 41 mL/min — AB (ref >60)
GLUCOSE: 146 mg/dL — AB (ref 65–99)
POTASSIUM: 4 mmol/L (ref 3.5–5.1)
Sodium: 139 mmol/L (ref 135–145)
Total Bilirubin: 0.6 mg/dL (ref 0.3–1.2)
Total Protein: 6.4 g/dL — ABNORMAL LOW (ref 6.5–8.1)

## 2016-10-25 LAB — URINALYSIS, ROUTINE W REFLEX MICROSCOPIC
BILIRUBIN URINE: NEGATIVE
GLUCOSE, UA: 50 mg/dL — AB
HGB URINE DIPSTICK: NEGATIVE
Ketones, ur: NEGATIVE mg/dL
Leukocytes, UA: NEGATIVE
Nitrite: NEGATIVE
PROTEIN: NEGATIVE mg/dL
Specific Gravity, Urine: 1.015 (ref 1.005–1.030)
pH: 6 (ref 5.0–8.0)

## 2016-10-25 LAB — CBC WITH DIFFERENTIAL/PLATELET
Basophils Absolute: 0 10*3/uL (ref 0.0–0.1)
Basophils Relative: 0 %
EOS ABS: 0.2 10*3/uL (ref 0.0–0.7)
EOS PCT: 2 %
HCT: 45.3 % (ref 36.0–46.0)
Hemoglobin: 14 g/dL (ref 12.0–15.0)
LYMPHS ABS: 2.2 10*3/uL (ref 0.7–4.0)
LYMPHS PCT: 30 %
MCH: 29.2 pg (ref 26.0–34.0)
MCHC: 30.9 g/dL (ref 30.0–36.0)
MCV: 94.4 fL (ref 78.0–100.0)
MONO ABS: 0.7 10*3/uL (ref 0.1–1.0)
MONOS PCT: 9 %
Neutro Abs: 4.4 10*3/uL (ref 1.7–7.7)
Neutrophils Relative %: 59 %
PLATELETS: 130 10*3/uL — AB (ref 150–400)
RBC: 4.8 MIL/uL (ref 3.87–5.11)
RDW: 14.6 % (ref 11.5–15.5)
WBC: 7.5 10*3/uL (ref 4.0–10.5)

## 2016-10-25 MED ORDER — METHOCARBAMOL 500 MG PO TABS
500.0000 mg | ORAL_TABLET | Freq: Once | ORAL | Status: AC
  Administered 2016-10-25: 500 mg via ORAL
  Filled 2016-10-25: qty 1

## 2016-10-25 MED ORDER — PREDNISONE 10 MG (21) PO TBPK: ORAL_TABLET | Freq: Every day | ORAL | 0 refills | Status: DC

## 2016-10-25 MED ORDER — OXYCODONE-ACETAMINOPHEN 5-325 MG PO TABS
1.0000 | ORAL_TABLET | Freq: Once | ORAL | Status: AC
  Administered 2016-10-25: 1 via ORAL
  Filled 2016-10-25: qty 1

## 2016-10-25 MED ORDER — METHOCARBAMOL 500 MG PO TABS: 500.0000 mg | ORAL_TABLET | Freq: Two times a day (BID) | ORAL | 0 refills | Status: DC

## 2016-10-25 NOTE — ED Triage Notes (Addendum)
Pt c/o lower back pain x 2 days. Denies dysuria, hematuria, fever. LBM today. Denies injury. Pt reports being given pain medication today at 1600 at [facility], but pt is unsure what she was given.

## 2016-10-25 NOTE — ED Provider Notes (Signed)
Emergency Department Provider Note   I have reviewed the triage vital signs and the nursing notes.   HISTORY  Chief Complaint Back Pain   HPI [name] is a 83 y.o. female with PMH of back pain, COPD, a-fib on Eliquis, Fibromyalgia, HTN, and HLD presents to the emergency department for evaluation of acute on chronic lower back pain. Patient has had 2 days of gradually worsening symptoms. No numbness or tingling in the legs. No fevers or chills. Patient denies any injury. She was seen in the emergency department 4 days ago for hand injury but this was not the result of the fall. She began complaining of more pain today at her assisted living facility and received a dose of her Percocet with no significant relief in symptoms. Denies radiation of pain. Pain worse with movement. No bowel or bladder symptoms. Patient denies any dysuria, hesitancy, urgency.   Past Medical History:  Diagnosis Date  . Anemia   . Atrial fibrillation ([place])   . Back pain   . Bacterial pneumonia   . Cancer (HCC)    Skin   . Chronic respiratory failure ([place])   . COPD (chronic obstructive pulmonary disease) ([place])   . Dementia   . Depression   . Diverticulosis   . Diverticulosis   . Fatty liver   . Fibromyalgia   . Fibromyalgia   . Gastric polyp 09/08/11   at the anastomosis-inflammatory  . Generalized anxiety disorder   . Generalized anxiety disorder   . GERD (gastroesophageal reflux disease)   . 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 Hypercholesteremia   . Hypertension   . Insomnia   . Internal hemorrhoids   . On home O2    qhs  . Osteoporosis   . Rheumatoid arteritis   . Ulcer    stomach  . Vitamin D deficiency     Patient Active Problem List   Diagnosis Date Noted  . Peripheral neuropathy 08/01/2016  . AKI (acute kidney injury) ([place]) 07/26/2016  . Atrial fibrillation ([place]) 07/26/2016  . Hypertension 07/26/2016  . Acute gastroenteritis 07/26/2016  . Altered mental  status 07/26/2016  . Prolonged QT interval 07/26/2016  . Syncope 09/12/2014  . CKD (chronic kidney disease) 09/12/2014  . Hyperglycemia 09/12/2014  . Hypoxia 09/12/2014  . Acute encephalopathy 09/12/2014  . Chest pain 07/21/2013  . Chronic respiratory failure ([place]) 07/21/2013  . Hypokalemia 07/21/2013  . Atrial fibrillation with RVR ([place]) 07/21/2013  . Knee pain 05/21/2013  . Fibromyalgia   . COPD (chronic obstructive pulmonary disease) ([place])   . 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 GERD (gastroesophageal reflux disease)   . Rheumatoid arteritis   . Back pain   . Fracture of distal fibula 09/20/2010    Past Surgical History:  Procedure Laterality Date  . ABDOMINAL HYSTERECTOMY    . ABDOMINAL SURGERY     removed patial stomach from ulcer  . COLONOSCOPY  04-2001   internal hemorrhoids, panocolonic diverticulosis, and colon polyps   . UPPER GASTROINTESTINAL ENDOSCOPY      Current Outpatient Rx  . Order #: [id number] Class: Historical Med  . Order #: [id number] Class: Historical Med  . Order #: [id number] Class: Normal  . Order #: [id number] Class: Historical Med  . Order #: [id number] Class: Historical Med  . Order #: [id number] Class: Historical Med  . Order #: [id number] Class: Historical Med  . Order #: [id number] Class: Historical Med  . Order #: [id number] Class: Historical Med  . Order #: [id number] Class: Historical Med  . Order #: [id number] Class: Historical  Med  . Order #: [id number] Class: Historical Med  . Order #: [id number] Class: Historical Med  . Order #: [id number] Class: Historical Med  . Order #: [id number] Class: Historical Med  . Order #: [id number] Class: Historical Med  . Order #: [id number] Class: Historical Med  . Order #: [id number] Class: Historical Med  . Order #: [id number] Class: Historical Med  . Order #: [id number] Class: Print  . Order #: [id number] Class: Historical Med  . Order #: [id number] Class: Print  . Order #: [id number] Class: Print    Allergies Other; Tape; Lipitor  [atorvastatin calcium]; and Niaspan [niacin er]  Family History  Problem Relation Age of Onset  . Heart disease Father   . Kidney disease Mother        kidney cancer  . Colon cancer Daughter 13    Social History Social History  Substance Use Topics  . Smoking status: Never Smoker  . Smokeless tobacco: Never Used  . Alcohol use No    Review of Systems  Constitutional: No fever/chills Eyes: No visual changes. ENT: No sore throat. Cardiovascular: Denies chest pain. Respiratory: Denies shortness of breath. Gastrointestinal: No abdominal pain.  No nausea, no vomiting.  No diarrhea.  No constipation. Genitourinary: Negative for dysuria. Musculoskeletal: Positive for back pain. Skin: Negative for rash. Neurological: Negative for headaches, focal weakness or numbness.  10-point ROS otherwise negative.  ____________________________________________   PHYSICAL EXAM:  VITAL SIGNS: ED Triage Vitals  Enc Vitals Group     BP 10/25/16 1653 140/60     Pulse Rate 10/25/16 1653 73     Resp 10/25/16 1653 20     Temp 10/25/16 1653 98.3 F (36.8 C)     Temp Source 10/25/16 1653 Oral     SpO2 10/25/16 1653 (!) 86 %     Weight 10/25/16 1648 215 lb (97.5 kg)     Height 10/25/16 1648 5' 2.5" (1.588 m)     Pain Score 10/25/16 1645 10   Constitutional: Alert and oriented. Well appearing and in no acute distress. Eyes: Conjunctivae are normal.  Head: Atraumatic. Nose: No congestion/rhinnorhea. Mouth/Throat: Mucous membranes are moist. Neck: No stridor.   Cardiovascular: Normal rate, regular rhythm. Good peripheral circulation. Grossly normal heart sounds.   Respiratory: Normal respiratory effort.  No retractions. Lungs CTAB. Gastrointestinal: Soft and nontender. No distention.  Musculoskeletal: No lower extremity tenderness nor edema. No gross deformities of extremities. No pain with passive ROM of bilateral hips.  Neurologic:  Normal speech and language. No gross focal neurologic  deficits are appreciated.  Skin:  Skin is warm, dry and intact. No rash noted.  ____________________________________________   LABS (all labs ordered are listed, but only abnormal results are displayed)  Labs Reviewed  COMPREHENSIVE METABOLIC PANEL - Abnormal; Notable for the following:       Result Value   Glucose, Bld 146 (*)    Creatinine, Ser 1.18 (*)    Total Protein 6.4 (*)    Albumin 3.1 (*)    AST 47 (*)    GFR calc non Af Amer 41 (*)    GFR calc Af Amer 47 (*)    All other components within normal limits  CBC WITH DIFFERENTIAL/PLATELET - Abnormal; Notable for the following:    Platelets 130 (*)    All other components within normal limits  URINALYSIS, ROUTINE W REFLEX MICROSCOPIC - Abnormal; Notable for the following:    APPearance HAZY (*)    Glucose, UA 50 (*)    All other components within normal limits  URINE CULTURE  ____________________________________________  RADIOLOGY  Dg Lumbar Spine 2-3 Views  Result Date: 10/25/2016 CLINICAL DATA:  Low back pain.  No known injury. EXAM: LUMBAR SPINE - 2-3 VIEW COMPARISON:  None. FINDINGS: Mild rightward scoliosis in the mid to lower lumbar spine. Diffuse degenerative disc and facet disease. No fracture or subluxation. Aortic atherosclerosis. IMPRESSION: Scoliosis. Diffuse degenerative disc and facet disease. No acute findings. Electronically Signed   By: Rolm Baptise M.D.   On: 10/25/2016 19:13    ____________________________________________   PROCEDURES  Procedure(s) performed:   Procedures  None ____________________________________________   INITIAL IMPRESSION / ASSESSMENT AND PLAN / ED COURSE  Pertinent labs & imaging results that were available during my care of the patient were reviewed by me and considered in my medical decision making (see chart for details).  Patient resents to the emergency department for evaluation of lower back pain. She took oxycodone today with no improvement. No red flag signs  or symptoms to suggest cord compression or cauda equina. Patient with normal neurovascular exam in the lower extremities. No associated abdominal pain. Has been compliant with medications including Eliquis. Given the patient's age plan for plain film of the lumbar spine along with labs and urinalysis. We will give Robaxin for muscle spasm and reassess.   Labs and imaging unremarkable. Patient is not ambulatory at baseline. She is taken by wheelchair to meals. Currently working with PT three times per week. Added brief steroid course and muscle relaxer for pain. Will discuss pain mgmt and further imaging with PCP. No indication at this time for MRI. Discussed plan with patient and daughter in [place].   At this time, I do not feel there is any life-threatening condition present. I have reviewed and discussed all results (EKG, imaging, lab, urine as appropriate), exam findings with patient. I have reviewed nursing notes and appropriate previous records.  I feel the patient is safe to be discharged home without further emergent workup. Discussed usual and customary return precautions. Patient and family (if present) verbalize understanding and are comfortable with this plan.  Patient will follow-up with their primary care provider. If they do not have a primary care provider, information for follow-up has been provided to them. All questions have been answered.  ____________________________________________  FINAL CLINICAL IMPRESSION(S) / ED DIAGNOSES  Final diagnoses:  Acute midline low back pain without sciatica     MEDICATIONS GIVEN DURING THIS VISIT:  Medications  methocarbamol (ROBAXIN) tablet 500 mg (500 mg Oral Given 10/25/16 1918)  oxyCODONE-acetaminophen (PERCOCET/ROXICET) 5-325 MG per tablet 1 tablet (1 tablet Oral Given 10/25/16 2228)     NEW OUTPATIENT MEDICATIONS STARTED DURING THIS VISIT:  Discharge Medication List as of 10/25/2016  9:36 PM    START taking these medications    Details  methocarbamol (ROBAXIN) 500 MG tablet Take 1 tablet (500 mg total) by mouth 2 (two) times daily., Starting Tue 10/25/2016, Print    predniSONE (STERAPRED UNI-PAK 21 TAB) 10 MG (21) TBPK tablet Take by mouth daily. Take 6 tabs by mouth daily  for 2 days, then 5 tabs for 2 days, then 4 tabs for 2 days, then 3 tabs for 2 days, 2 tabs for 2 days, then 1 tab by mouth daily for 2 days, Starting Tue 10/25/2016, Print          Note:  This document was prepared using Dragon voice recognition software and may include unintentional dictation errors.  Nanda Quinton, MD Emergency Medicine    Rayyan Burley, [facility], MD 10/26/16 [phone]

## 2016-10-25 NOTE — ED Notes (Signed)
Pt was placed on 2L in the waiting room.

## 2016-10-25 NOTE — ED Notes (Signed)
Pt dressed by NT and this RN and was wheeled out to the car and daughter will take the pt back to [facility].

## 2016-10-25 NOTE — Discharge Instructions (Signed)

## 2016-10-27 LAB — URINE CULTURE: SPECIAL REQUESTS: NORMAL

## 2016-12-05 DIAGNOSIS — B351 Tinea unguium: Secondary | ICD-10-CM | POA: Diagnosis not present

## 2016-12-05 DIAGNOSIS — M79674 Pain in right toe(s): Secondary | ICD-10-CM | POA: Diagnosis not present

## 2016-12-05 DIAGNOSIS — M79675 Pain in left toe(s): Secondary | ICD-10-CM | POA: Diagnosis not present

## 2017-01-06 DIAGNOSIS — Z23 Encounter for immunization: Secondary | ICD-10-CM | POA: Diagnosis not present

## 2017-02-13 DIAGNOSIS — M79674 Pain in right toe(s): Secondary | ICD-10-CM | POA: Diagnosis not present

## 2017-02-13 DIAGNOSIS — B351 Tinea unguium: Secondary | ICD-10-CM | POA: Diagnosis not present

## 2017-02-13 DIAGNOSIS — M79675 Pain in left toe(s): Secondary | ICD-10-CM | POA: Diagnosis not present

## 2017-03-31 ENCOUNTER — Other Ambulatory Visit: Payer: Self-pay

## 2017-03-31 ENCOUNTER — Encounter (HOSPITAL_COMMUNITY): Payer: Self-pay | Admitting: Emergency Medicine

## 2017-03-31 ENCOUNTER — Inpatient Hospital Stay (HOSPITAL_COMMUNITY)
Admission: EM | Admit: 2017-03-31 | Discharge: 2017-04-03 | DRG: 190 | Disposition: A | Discharge status: Nursing Home (Includes ICF) | Payer: Medicare Other | Attending: Internal Medicine | Admitting: Internal Medicine

## 2017-03-31 ENCOUNTER — Emergency Department (HOSPITAL_COMMUNITY): Payer: Medicare Other

## 2017-03-31 DIAGNOSIS — K760 Fatty (change of) liver, not elsewhere classified: Secondary | ICD-10-CM | POA: Diagnosis present

## 2017-03-31 DIAGNOSIS — Z91048 Other nonmedicinal substance allergy status: Secondary | ICD-10-CM

## 2017-03-31 DIAGNOSIS — G608 Other hereditary and idiopathic neuropathies: Secondary | ICD-10-CM | POA: Diagnosis present

## 2017-03-31 DIAGNOSIS — J449 Chronic obstructive pulmonary disease, unspecified: Secondary | ICD-10-CM | POA: Diagnosis present

## 2017-03-31 DIAGNOSIS — E7800 Pure hypercholesterolemia, unspecified: Secondary | ICD-10-CM | POA: Diagnosis present

## 2017-03-31 DIAGNOSIS — F329 Major depressive disorder, single episode, unspecified: Secondary | ICD-10-CM | POA: Diagnosis present

## 2017-03-31 DIAGNOSIS — N183 Chronic kidney disease, stage 3 (moderate): Secondary | ICD-10-CM | POA: Diagnosis present

## 2017-03-31 DIAGNOSIS — J9621 Acute and chronic respiratory failure with hypoxia: Secondary | ICD-10-CM | POA: Diagnosis not present

## 2017-03-31 DIAGNOSIS — R739 Hyperglycemia, unspecified: Secondary | ICD-10-CM | POA: Diagnosis present

## 2017-03-31 DIAGNOSIS — E1165 Type 2 diabetes mellitus with hyperglycemia: Secondary | ICD-10-CM | POA: Diagnosis present

## 2017-03-31 DIAGNOSIS — F0390 Unspecified dementia without behavioral disturbance: Secondary | ICD-10-CM | POA: Diagnosis present

## 2017-03-31 DIAGNOSIS — I129 Hypertensive chronic kidney disease with stage 1 through stage 4 chronic kidney disease, or unspecified chronic kidney disease: Secondary | ICD-10-CM | POA: Diagnosis not present

## 2017-03-31 DIAGNOSIS — Z7901 Long term (current) use of anticoagulants: Secondary | ICD-10-CM

## 2017-03-31 DIAGNOSIS — G4700 Insomnia, unspecified: Secondary | ICD-10-CM | POA: Diagnosis present

## 2017-03-31 DIAGNOSIS — Z7951 Long term (current) use of inhaled steroids: Secondary | ICD-10-CM

## 2017-03-31 DIAGNOSIS — M797 Fibromyalgia: Secondary | ICD-10-CM | POA: Diagnosis present

## 2017-03-31 DIAGNOSIS — Z6841 Body Mass Index (BMI) 40.0 and over, adult: Secondary | ICD-10-CM

## 2017-03-31 DIAGNOSIS — K219 Gastro-esophageal reflux disease without esophagitis: Secondary | ICD-10-CM | POA: Diagnosis present

## 2017-03-31 DIAGNOSIS — E1122 Type 2 diabetes mellitus with diabetic chronic kidney disease: Secondary | ICD-10-CM

## 2017-03-31 DIAGNOSIS — F32A Depression, unspecified: Secondary | ICD-10-CM | POA: Diagnosis present

## 2017-03-31 DIAGNOSIS — M810 Age-related osteoporosis without current pathological fracture: Secondary | ICD-10-CM | POA: Diagnosis present

## 2017-03-31 DIAGNOSIS — I4581 Long QT syndrome: Secondary | ICD-10-CM | POA: Diagnosis present

## 2017-03-31 DIAGNOSIS — J441 Chronic obstructive pulmonary disease with (acute) exacerbation: Principal | ICD-10-CM | POA: Diagnosis present

## 2017-03-31 DIAGNOSIS — I10 Essential (primary) hypertension: Secondary | ICD-10-CM | POA: Diagnosis present

## 2017-03-31 DIAGNOSIS — I482 Chronic atrial fibrillation: Secondary | ICD-10-CM | POA: Diagnosis not present

## 2017-03-31 DIAGNOSIS — F411 Generalized anxiety disorder: Secondary | ICD-10-CM | POA: Diagnosis present

## 2017-03-31 DIAGNOSIS — Z85828 Personal history of other malignant neoplasm of skin: Secondary | ICD-10-CM

## 2017-03-31 DIAGNOSIS — Z9981 Dependence on supplemental oxygen: Secondary | ICD-10-CM

## 2017-03-31 DIAGNOSIS — J9611 Chronic respiratory failure with hypoxia: Secondary | ICD-10-CM | POA: Diagnosis not present

## 2017-03-31 DIAGNOSIS — Z79899 Other long term (current) drug therapy: Secondary | ICD-10-CM

## 2017-03-31 DIAGNOSIS — Z90710 Acquired absence of both cervix and uterus: Secondary | ICD-10-CM

## 2017-03-31 DIAGNOSIS — R05 Cough: Secondary | ICD-10-CM | POA: Diagnosis not present

## 2017-03-31 DIAGNOSIS — Z7952 Long term (current) use of systemic steroids: Secondary | ICD-10-CM

## 2017-03-31 DIAGNOSIS — I4891 Unspecified atrial fibrillation: Secondary | ICD-10-CM | POA: Diagnosis present

## 2017-03-31 DIAGNOSIS — Z888 Allergy status to other drugs, medicaments and biological substances status: Secondary | ICD-10-CM

## 2017-03-31 DIAGNOSIS — Z8249 Family history of ischemic heart disease and other diseases of the circulatory system: Secondary | ICD-10-CM

## 2017-03-31 DIAGNOSIS — Z903 Acquired absence of stomach [part of]: Secondary | ICD-10-CM

## 2017-03-31 DIAGNOSIS — R9431 Abnormal electrocardiogram [ECG] [EKG]: Secondary | ICD-10-CM | POA: Diagnosis present

## 2017-03-31 LAB — PHOSPHORUS: PHOSPHORUS: 3 mg/dL (ref 2.5–4.6)

## 2017-03-31 LAB — COMPREHENSIVE METABOLIC PANEL
ALK PHOS: 70 U/L (ref 38–126)
ALT: 17 U/L (ref 14–54)
AST: 30 U/L (ref 15–41)
Albumin: 3.2 g/dL — ABNORMAL LOW (ref 3.5–5.0)
Anion gap: 11 (ref 5–15)
BUN: 17 mg/dL (ref 6–20)
CO2: 29 mmol/L (ref 22–32)
CREATININE: 1.29 mg/dL — AB (ref 0.44–1.00)
Calcium: 9.7 mg/dL (ref 8.9–10.3)
Chloride: 102 mmol/L (ref 101–111)
GFR calc Af Amer: 42 mL/min — ABNORMAL LOW (ref >60)
GFR calc non Af Amer: 36 mL/min — ABNORMAL LOW (ref >60)
GLUCOSE: 165 mg/dL — AB (ref 65–99)
Potassium: 3.5 mmol/L (ref 3.5–5.1)
SODIUM: 142 mmol/L (ref 135–145)
Total Bilirubin: 0.7 mg/dL (ref 0.3–1.2)
Total Protein: 6.9 g/dL (ref 6.5–8.1)

## 2017-03-31 LAB — CBC WITH DIFFERENTIAL/PLATELET
Basophils Absolute: 0 10*3/uL (ref 0.0–0.1)
Basophils Relative: 0 %
Eosinophils Absolute: 0.2 10*3/uL (ref 0.0–0.7)
Eosinophils Relative: 3 %
HCT: 44.7 % (ref 36.0–46.0)
HEMOGLOBIN: 13.4 g/dL (ref 12.0–15.0)
LYMPHS ABS: 2 10*3/uL (ref 0.7–4.0)
LYMPHS PCT: 21 %
MCH: 26.8 pg (ref 26.0–34.0)
MCHC: 30 g/dL (ref 30.0–36.0)
MCV: 89.4 fL (ref 78.0–100.0)
Monocytes Absolute: 0.6 10*3/uL (ref 0.1–1.0)
Monocytes Relative: 7 %
NEUTROS ABS: 6.4 10*3/uL (ref 1.7–7.7)
NEUTROS PCT: 69 %
Platelets: 206 10*3/uL (ref 150–400)
RBC: 5 MIL/uL (ref 3.87–5.11)
RDW: 16.1 % — ABNORMAL HIGH (ref 11.5–15.5)
WBC: 9.2 10*3/uL (ref 4.0–10.5)

## 2017-03-31 LAB — LACTIC ACID, PLASMA
Lactic Acid, Venous: 1.6 mmol/L (ref 0.5–1.9)
Lactic Acid, Venous: 1.7 mmol/L (ref 0.5–1.9)

## 2017-03-31 LAB — BRAIN NATRIURETIC PEPTIDE: B Natriuretic Peptide: 117 pg/mL — ABNORMAL HIGH (ref 0.0–100.0)

## 2017-03-31 LAB — MAGNESIUM: Magnesium: 2 mg/dL (ref 1.7–2.4)

## 2017-03-31 LAB — TROPONIN I: Troponin I: <0.03 ng/mL (ref <0.03)

## 2017-03-31 MED ORDER — DOXYCYCLINE HYCLATE 100 MG PO TABS
100.0000 mg | ORAL_TABLET | Freq: Once | ORAL | Status: AC
  Administered 2017-03-31: 100 mg via ORAL
  Filled 2017-03-31: qty 1

## 2017-03-31 MED ORDER — VITAMIN D 1000 UNITS PO TABS
1000.0000 [IU] | ORAL_TABLET | Freq: Every day | ORAL | Status: DC
  Administered 2017-04-01 – 2017-04-02 (×2): 1000 [IU] via ORAL
  Filled 2017-03-31 (×2): qty 1

## 2017-03-31 MED ORDER — POTASSIUM CHLORIDE CRYS ER 20 MEQ PO TBCR
20.0000 meq | EXTENDED_RELEASE_TABLET | Freq: Once | ORAL | Status: AC
Start: 2017-03-31 — End: 2017-03-31
  Administered 2017-03-31: 20 meq via ORAL
  Filled 2017-03-31: qty 1

## 2017-03-31 MED ORDER — ALBUTEROL (5 MG/ML) CONTINUOUS INHALATION SOLN
10.0000 mg/h | INHALATION_SOLUTION | Freq: Once | RESPIRATORY_TRACT | Status: AC
  Administered 2017-03-31: 10 mg/h via RESPIRATORY_TRACT
  Filled 2017-03-31: qty 20

## 2017-03-31 MED ORDER — PREGABALIN 50 MG PO CAPS
100.0000 mg | ORAL_CAPSULE | Freq: Three times a day (TID) | ORAL | Status: DC
  Administered 2017-04-01 – 2017-04-03 (×8): 100 mg via ORAL
  Filled 2017-03-31: qty 2
  Filled 2017-03-31: qty 4
  Filled 2017-03-31 (×6): qty 2

## 2017-03-31 MED ORDER — PANTOPRAZOLE SODIUM 40 MG PO TBEC
40.0000 mg | DELAYED_RELEASE_TABLET | Freq: Two times a day (BID) | ORAL | Status: DC
Start: 2017-04-01 — End: 2017-04-03
  Administered 2017-04-01 – 2017-04-03 (×6): 40 mg via ORAL
  Filled 2017-03-31 (×6): qty 1

## 2017-03-31 MED ORDER — DILTIAZEM HCL ER COATED BEADS 180 MG PO CP24
300.0000 mg | ORAL_CAPSULE | Freq: Every day | ORAL | Status: DC
  Administered 2017-04-01 – 2017-04-03 (×3): 300 mg via ORAL
  Filled 2017-03-31 (×3): qty 1

## 2017-03-31 MED ORDER — LORAZEPAM 0.5 MG PO TABS
0.2500 mg | ORAL_TABLET | Freq: Two times a day (BID) | ORAL | Status: DC
  Administered 2017-04-01 – 2017-04-03 (×6): 0.25 mg via ORAL
  Filled 2017-03-31 (×6): qty 1

## 2017-03-31 MED ORDER — METHOCARBAMOL 500 MG PO TABS: 500.0000 mg | ORAL_TABLET | Freq: Two times a day (BID) | ORAL | Status: DC

## 2017-03-31 MED ORDER — MAGNESIUM SULFATE 2 GM/50ML IV SOLN
2.0000 g | Freq: Once | INTRAVENOUS | Status: AC
  Administered 2017-03-31: 2 g via INTRAVENOUS
  Filled 2017-03-31: qty 50

## 2017-03-31 MED ORDER — FUROSEMIDE 20 MG PO TABS
20.0000 mg | ORAL_TABLET | Freq: Every day | ORAL | Status: DC
  Administered 2017-04-01 – 2017-04-03 (×3): 20 mg via ORAL
  Filled 2017-03-31 (×3): qty 1

## 2017-03-31 MED ORDER — APIXABAN 2.5 MG PO TABS
2.5000 mg | ORAL_TABLET | Freq: Two times a day (BID) | ORAL | Status: DC
  Administered 2017-04-01 – 2017-04-03 (×6): 2.5 mg via ORAL
  Filled 2017-03-31 (×6): qty 1

## 2017-03-31 MED ORDER — IPRATROPIUM BROMIDE 0.02 % IN SOLN
1.0000 mg | Freq: Once | RESPIRATORY_TRACT | Status: AC
  Administered 2017-03-31: 1 mg via RESPIRATORY_TRACT
  Filled 2017-03-31: qty 5

## 2017-03-31 MED ORDER — DULOXETINE HCL 60 MG PO CPEP
60.0000 mg | ORAL_CAPSULE | Freq: Every day | ORAL | Status: DC
  Administered 2017-04-01 – 2017-04-03 (×3): 60 mg via ORAL
  Filled 2017-03-31 (×3): qty 1

## 2017-03-31 MED ORDER — METHYLPREDNISOLONE SODIUM SUCC 125 MG IJ SOLR
125.0000 mg | Freq: Once | INTRAMUSCULAR | Status: AC
  Administered 2017-03-31: 125 mg via INTRAVENOUS
  Filled 2017-03-31: qty 2

## 2017-03-31 MED ORDER — OXYCODONE-ACETAMINOPHEN 7.5-325 MG PO TABS: 1.0000 | ORAL_TABLET | ORAL | Status: DC | PRN

## 2017-03-31 NOTE — ED Notes (Addendum)
Respiratory at bedside.

## 2017-03-31 NOTE — ED Triage Notes (Signed)
Patient's O2 sat in 80s, placed on O2 via [place].

## 2017-03-31 NOTE — ED Notes (Signed)
Will ambulate patient after continuous neb is completed

## 2017-03-31 NOTE — ED Notes (Signed)
Standing patient from the bed to the chair patient's oxygen saturation dropped from 92% to 82% on [place] 2.5L oxygen

## 2017-03-31 NOTE — H&P (Signed)
History and Physical    [name] XTK:[id number] DOB: 07/07/1928 DOA: 03/31/2017  PCP: Celene Squibb, MD   Patient coming from: Assisted living home.  I have personally briefly reviewed patient's old medical records in [place]  Chief Complaint: Shortness of breath.  HPI: [name] is a 84 y.o. female with medical history significant of anemia, atrial fibrillation, back pain, bacterial pneumonia, skin cancer, COPD, chronic respiratory failure, dementia, depression, diverticulosis, fatty liver disease, fibromyalgia, gastric polyp, generalized anxiety disorder, GERD,.  [place] peripheral neuropathy, hyperlipidemia, hypertension, internal hemorrhoids, osteoporosis, rheumatoid arthritis, PUD, vitamin D deficiency who is coming to the emergency department from her assisted living facility due to progressively worse dyspnea associated with productive cough for several days.  No reports of fever as far as we know.  ED Course: Initial vital signs temperature 36.8C (98.3 F), pulse 74, respirations 24, blood pressure 113/79 mmHg and O2 sat 92% on room air.  Her CBC was normal.  CMP showed a glucose of 165 and creatinine 1.29 mg/dL.  And albumin of 3.2 g/dL.  All other chemistry values were within normal limits.  Lactic acid x2 was normal.  Troponin was normal.  Magnesium was 2.0 and phosphorus 3.0 mg/dL.  Her chest radiograph showed cardiomegaly and bibasilar opacities with questionable atelectasis versus pneumonia.  She was given supplemental oxygen, bronchodilators, Solu-Medrol 125 mg IVP x1 and doxycycline 100 mg p.o. x1 in the ED.  Review of Systems: Unable to perform due to dementia.   Past Medical History:  Diagnosis Date  . Anemia   . Atrial fibrillation ([place])   . Back pain   . Bacterial pneumonia   . Cancer (HCC)    Skin   . Chronic respiratory failure ([place])   . COPD (chronic obstructive pulmonary disease) ([place])   . Dementia   . Depression   .  Diverticulosis   . Diverticulosis   . Fatty liver   . Fibromyalgia   . Fibromyalgia   . Gastric polyp 09/08/11   at the anastomosis-inflammatory  . Generalized anxiety disorder   . Generalized anxiety disorder   . GERD (gastroesophageal reflux disease)   . 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 Hypercholesteremia   . Hypertension   . Insomnia   . Internal hemorrhoids   . On home O2    qhs  . Osteoporosis   . Rheumatoid arteritis   . Ulcer    stomach  . Vitamin D deficiency     Past Surgical History:  Procedure Laterality Date  . ABDOMINAL HYSTERECTOMY    . ABDOMINAL SURGERY     removed patial stomach from ulcer  . COLONOSCOPY  04-2001   internal hemorrhoids, panocolonic diverticulosis, and colon polyps   . UPPER GASTROINTESTINAL ENDOSCOPY       reports that  has never smoked. she has never used smokeless tobacco. She reports that she does not drink alcohol or use drugs.  Allergies  Allergen Reactions  . Other     Band Aids   . Tape   . Lipitor [Atorvastatin Calcium] Rash and Other (See Comments)    Muscle pain  . Niaspan [Niacin Er] Rash and Other (See Comments)    Muscle pain    Family History  Problem Relation Age of Onset  . Heart disease Father   . Kidney disease Mother        kidney cancer  . Colon cancer Daughter 57    Prior to Admission  medications   Medication Sig Start Date End Date Taking? Authorizing Provider  albuterol (PROAIR HFA) 108 (90 BASE) MCG/ACT inhaler Inhale 2 puffs into the lungs every 6 (six) hours as needed for wheezing or shortness of breath. For rescue    Yes [provider]  apixaban (ELIQUIS) 2.5 MG TABS tablet Take 2.5 mg by mouth 2 (two) times daily.   Yes [provider]  budesonide-formoterol (SYMBICORT) 80-4.5 MCG/ACT inhaler Inhale 2 puffs into the lungs 2 (two) times daily.   Yes [provider]  CARTIA XT 300 MG 24 hr capsule TAKE ONE CAPSULE BY MOUTH EVERY DAY 09/25/14  Yes Branch, Alphonse Guild, MD  Cholecalciferol (VITAMIN D3) 1000 UNITS CAPS Take 1 capsule by mouth daily.   Yes [provider]  diltiazem (TIAZAC) 300 MG 24 hr capsule Take 300 mg by mouth daily.   Yes [provider]  donepezil (ARICEPT) 5 MG tablet Take 5 mg by mouth at bedtime. 05/06/16  Yes [provider]  DULoxetine (CYMBALTA) 60 MG capsule Take 60 mg by mouth daily.     Yes [provider]  Fluticasone-Umeclidin-Vilant (TRELEGY ELLIPTA) 100-62.5-25 MCG/INH AEPB Inhale 1 puff into the lungs daily.   Yes [provider]  furosemide (LASIX) 20 MG tablet Take 20 mg by mouth daily.    Yes [provider]  Lactobacillus (ACIDOPHILUS/PECTIN PO) Take 1 capsule by mouth daily.   Yes [provider]  LORazepam (ATIVAN) 0.5 MG tablet Take 0.25 mg by mouth 2 (two) times daily.   Yes [provider]  LYRICA 100 MG capsule Take 100 mg by mouth 3 (three) times daily. 12/07/15  Yes [provider]  methotrexate (RHEUMATREX) 2.5 MG tablet Take 12.5 mg by mouth once a week. *Taken on Thursdays at 1200Caution:Chemotherapy. Protect from light.   Yes [provider]  Multiple Vitamins-Minerals (MULTIVITAMIN WITH MINERALS) tablet Take 1 tablet by mouth daily.   Yes [provider]  oxyCODONE-acetaminophen (PERCOCET) 7.5-325 MG tablet Take one tablet by mouth every 4 hours as needed for pain. Max APAP 3gm/24hrs 08/18/16  Yes Reed, Tiffany L, DO  pantoprazole (PROTONIX) 40 MG tablet Take 40 mg by mouth 2 (two) times daily.    Yes [provider]  phenylephrine-shark liver oil-mineral oil-petrolatum (PREPARATION H) 0.25-3-14-71.9 % rectal ointment Place 1 application rectally 2 (two) times daily as needed for hemorrhoids.   Yes [provider]  polyethylene glycol (MIRALAX / GLYCOLAX) packet Take 17 g by mouth daily as needed for mild constipation or moderate constipation.    Yes [provider]  potassium chloride  (K-DUR,KLOR-CON) 10 MEQ tablet Take 10 mEq by mouth daily. Take 1 tablet by mouth once a day - take with Lasix   Yes [provider]  predniSONE (DELTASONE) 2.5 MG tablet Take 2.5 mg by mouth daily.   Yes [provider]  raloxifene (EVISTA) 60 MG tablet Take 60 mg by mouth daily.   Yes [provider]  rosuvastatin (CRESTOR) 20 MG tablet Take 20 mg by mouth daily.    Yes [provider]  methocarbamol (ROBAXIN) 500 MG tablet Take 1 tablet (500 mg total) by mouth 2 (two) times daily. Patient not taking: Reported on 03/31/2017 10/25/16   Long, [facility], MD  predniSONE (STERAPRED UNI-PAK 21 TAB) 10 MG (21) TBPK tablet Take by mouth daily. Take 6 tabs by mouth daily  for 2 days, then 5 tabs for 2 days, then 4 tabs for 2 days, then 3  tabs for 2 days, 2 tabs for 2 days, then 1 tab by mouth daily for 2 days Patient not taking: Reported on 03/31/2017 10/25/16   Margette Fast, MD    Physical Exam: Vitals:   03/31/17 2300 03/31/17 2315 03/31/17 2330 03/31/17 2335  BP:  121/77    Pulse:  70 87   Resp:  16 20   Temp:    98 F (36.7 C)  TempSrc:    Oral  SpO2: 94% 90% 98%   Weight:      Height:        Constitutional: NAD, calm, comfortable Eyes: PERRL, lids and conjunctivae normal ENMT: Mucous membranes are mildly dry. Posterior pharynx clear of any exudate or lesions. Neck: normal, supple, no masses, no thyromegaly Respiratory: Decreased breath sounds with wheezing and rhonchi bilaterally, no accessory muscle use.  Cardiovascular: Regularly irregular no murmurs / rubs / gallops. No extremity edema. 2+ pedal pulses. No carotid bruits.  Abdomen: Soft, no tenderness, no masses palpated. No hepatosplenomegaly. Bowel sounds positive.  Musculoskeletal: no clubbing / cyanosis. Good ROM, no contractures. Normal muscle tone.  Skin: no  clinically significant rashes, lesions, ulcers on limited dermatological exam Neurologic: CN 2-12 grossly intact. Sensation intact, DTR  normal. Strength 5/5 in all 4.  Psychiatric:  Alert and oriented x 2, disoriented to time and situation. Normal mood.    Labs on Admission: I have personally reviewed following labs and imaging studies  CBC: Recent Labs  Lab 03/31/17 1852  WBC 9.2  NEUTROABS 6.4  HGB 13.4  HCT 44.7  MCV 89.4  PLT 384   Basic Metabolic Panel: Recent Labs  Lab 03/31/17 1852  NA 142  K 3.5  CL 102  CO2 29  GLUCOSE 165*  BUN 17  CREATININE 1.29*  CALCIUM 9.7  MG 2.0  PHOS 3.0   GFR: Estimated Creatinine Clearance: 32.8 mL/min (A) (by C-G formula based on SCr of 1.29 mg/dL (H)). Liver Function Tests: Recent Labs  Lab 03/31/17 1852  AST 30  ALT 17  ALKPHOS 70  BILITOT 0.7  PROT 6.9  ALBUMIN 3.2*   No results for input(s): LIPASE, AMYLASE in the last 168 hours. No results for input(s): AMMONIA in the last 168 hours. Coagulation Profile: No results for input(s): INR, PROTIME in the last 168 hours. Cardiac Enzymes: Recent Labs  Lab 03/31/17 1852  TROPONINI <0.03   BNP (last 3 results) No results for input(s): PROBNP in the last 8760 hours. HbA1C: No results for input(s): HGBA1C in the last 72 hours. CBG: No results for input(s): GLUCAP in the last 168 hours. Lipid Profile: No results for input(s): CHOL, HDL, LDLCALC, TRIG, CHOLHDL, LDLDIRECT in the last 72 hours. Thyroid Function Tests: No results for input(s): TSH, T4TOTAL, FREET4, T3FREE, THYROIDAB in the last 72 hours. Anemia Panel: No results for input(s): VITAMINB12, FOLATE, FERRITIN, TIBC, IRON, RETICCTPCT in the last 72 hours. Urine analysis:    Component Value Date/Time   COLORURINE YELLOW 10/25/2016 1848   APPEARANCEUR HAZY (A) 10/25/2016 1848   LABSPEC 1.015 10/25/2016 1848   PHURINE 6.0 10/25/2016 1848   GLUCOSEU 50 (A) 10/25/2016 1848   HGBUR NEGATIVE 10/25/2016 1848   BILIRUBINUR NEGATIVE 10/25/2016 1848   KETONESUR NEGATIVE 10/25/2016 1848   PROTEINUR NEGATIVE 10/25/2016 1848   UROBILINOGEN 1.0  10/11/2014 1428   NITRITE NEGATIVE 10/25/2016 1848   LEUKOCYTESUR NEGATIVE 10/25/2016 1848    Radiological Exams on Admission: Dg Chest 2 View  Result Date: 03/31/2017 CLINICAL DATA:  Cough. EXAM:  CHEST  2 VIEW COMPARISON:  07/26/2016 and prior radiographs FINDINGS: Cardiomegaly again noted. Bibasilar opacities are present-question atelectasis versus pneumonia. No pleural effusion or pneumothorax. No acute bony abnormalities are noted. IMPRESSION: Bibasilar opacities-question atelectasis versus pneumonia. Cardiomegaly. Electronically Signed   By: Margarette Canada M.D.   On: 03/31/2017 19:34    EKG: Independently reviewed. Vent. rate 68 BPM PR interval * ms QRS duration 98 ms QT/QTc 554/590 ms P-R-T axes * 9 70 Atrial fibrillation Low voltage, precordial leads Consider anterior infarct Prolonged QT interval  Assessment/Plan Principal Problem:   COPD exacerbation (HCC) Observation/telemetry. Continue supplemental oxygen. Continue schedule and as needed bronchodilators. Continue Solu-Medrol 40 mg IV PB every 6 hours. Doxycycline 100 mg p.o. twice daily.  Active Problems:   GERD (gastroesophageal reflux disease) Pantoprazole 40 mg p.o. daily.    Hyperglycemia Carbohydrate modified diet. CBG monitoring before meals and bedtime. Regular insulin sliding scale coverage while on glucocorticoids.    Atrial fibrillation (HCC) CHA2DS2-VASc Score of at least 5. Continue diltiazem 300 mg p.o. daily. Continue Eliquis.    Hypertension Continue Cardizem 300 mg p.o. Daily. On furosemide 20 mg p.o. daily. Monitor blood pressure, heart rate, renal function and electrolytes.    Prolonged QT interval Magnesium and potassium supplemented. Check EKG in a.m. Avoid or minimize use of QT interval prolonging medications. Monitor electrolytes.    Depression Continue duloxetine 60 mg p.o. daily. Continue lorazepam as needed for anxiety.    Dementia Aricept held due to prolonged  QT.    DVT prophylaxis: On Eliquis. Code Status: Full code. Family Communication: Her granddaughter Jarrett Soho was present in the ED room. Disposition Plan: Admit for COPD exacerbation treatment. Consults called:  Admission status: Observation/telemetry.   Reubin Milan MD [facility] Pager [phone].  If 7PM-7AM, please contact night-coverage [email or web address] Password [email or web address]  03/31/2017, 11:40 PM

## 2017-03-31 NOTE — ED Triage Notes (Signed)
Patient with cough, from facility. Family reports cough has become worse but unknown date of worsening.

## 2017-03-31 NOTE — ED Notes (Signed)
Pt sat on room air hovered high 80s, low 90s. Pt given 2L O2 via [place], O2 sats rose to mid 90s

## 2017-03-31 NOTE — ED Provider Notes (Signed)
[facility] EMERGENCY DEPARTMENT Provider Note   CSN: [id number] Arrival date & time: 03/31/17  1813     History   Chief Complaint Chief Complaint  Patient presents with  . Cough    HPI [name] is a 84 y.o. female.  The history is provided by the patient and a relative. The history is limited by the condition of the patient (Hx dementia).  Cough   Pt was seen at 1930. Per pt's family: Pt from assisted living facility. Family was called today and told by staff her "lungs didn't sound right." Facility was unable to reach doctor on call, so they sent her to the ED for further evaluation. Pt's family states pt has had a cough and wheezing for unknown period of time. Pt has significant hx of dementia and is HOH.    Past Medical History:  Diagnosis Date  . Anemia   . Atrial fibrillation ([place])   . Back pain   . Bacterial pneumonia   . Cancer (HCC)    Skin   . Chronic respiratory failure ([place])   . COPD (chronic obstructive pulmonary disease) ([place])   . Dementia   . Depression   . Diverticulosis   . Diverticulosis   . Fatty liver   . Fibromyalgia   . Fibromyalgia   . Gastric polyp 09/08/11   at the anastomosis-inflammatory  . Generalized anxiety disorder   . Generalized anxiety disorder   . GERD (gastroesophageal reflux disease)   . 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 Hypercholesteremia   . Hypertension   . Insomnia   . Internal hemorrhoids   . On home O2    qhs  . Osteoporosis   . Rheumatoid arteritis   . Ulcer    stomach  . Vitamin D deficiency     Patient Active Problem List   Diagnosis Date Noted  . Peripheral neuropathy 08/01/2016  . AKI (acute kidney injury) ([place]) 07/26/2016  . Atrial fibrillation ([place]) 07/26/2016  . Hypertension 07/26/2016  . Acute gastroenteritis 07/26/2016  . Altered mental status 07/26/2016  . Prolonged QT interval 07/26/2016  . Syncope 09/12/2014  . CKD (chronic kidney disease) 09/12/2014  . Hyperglycemia  09/12/2014  . Hypoxia 09/12/2014  . Acute encephalopathy 09/12/2014  . Chest pain 07/21/2013  . Chronic respiratory failure ([place]) 07/21/2013  . Hypokalemia 07/21/2013  . Atrial fibrillation with RVR ([place]) 07/21/2013  . Knee pain 05/21/2013  . Fibromyalgia   . COPD (chronic obstructive pulmonary disease) ([place])   . 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 GERD (gastroesophageal reflux disease)   . Rheumatoid arteritis   . Back pain   . Fracture of distal fibula 09/20/2010    Past Surgical History:  Procedure Laterality Date  . ABDOMINAL HYSTERECTOMY    . ABDOMINAL SURGERY     removed patial stomach from ulcer  . COLONOSCOPY  04-2001   internal hemorrhoids, panocolonic diverticulosis, and colon polyps   . UPPER GASTROINTESTINAL ENDOSCOPY      OB History    Gravida Para Term Preterm AB Living   6 6 6     4    SAB TAB Ectopic Multiple Live Births                   Home Medications    Prior to Admission medications   Medication Sig Start Date End Date Taking? Authorizing Provider  albuterol (PROAIR HFA) 108 (90 BASE) MCG/ACT inhaler Inhale 2 puffs into the lungs every 6 (six) hours as needed for  wheezing or shortness of breath. For rescue    Yes [provider]  apixaban (ELIQUIS) 2.5 MG TABS tablet Take 2.5 mg by mouth 2 (two) times daily.   Yes [provider]  budesonide-formoterol (SYMBICORT) 80-4.5 MCG/ACT inhaler Inhale 2 puffs into the lungs 2 (two) times daily.   Yes [provider]  CARTIA XT 300 MG 24 hr capsule TAKE ONE CAPSULE BY MOUTH EVERY DAY 09/25/14  Yes Branch, Alphonse Guild, MD  Cholecalciferol (VITAMIN D3) 1000 UNITS CAPS Take 1 capsule by mouth daily.   Yes [provider]  diltiazem (TIAZAC) 300 MG 24 hr capsule Take 300 mg by mouth daily.   Yes [provider]  donepezil (ARICEPT) 5 MG tablet Take 5 mg by mouth at bedtime. 05/06/16  Yes [provider]  DULoxetine (CYMBALTA) 60 MG capsule Take 60 mg by  mouth daily.     Yes [provider]  Fluticasone-Umeclidin-Vilant (TRELEGY ELLIPTA) 100-62.5-25 MCG/INH AEPB Inhale 1 puff into the lungs daily.   Yes [provider]  furosemide (LASIX) 20 MG tablet Take 20 mg by mouth daily.    Yes [provider]  Lactobacillus (ACIDOPHILUS/PECTIN PO) Take 1 capsule by mouth daily.   Yes [provider]  LORazepam (ATIVAN) 0.5 MG tablet Take 0.25 mg by mouth 2 (two) times daily.   Yes [provider]  LYRICA 100 MG capsule Take 100 mg by mouth 3 (three) times daily. 12/07/15  Yes [provider]  methotrexate (RHEUMATREX) 2.5 MG tablet Take 12.5 mg by mouth once a week. *Taken on Thursdays at 1200Caution:Chemotherapy. Protect from light.   Yes [provider]  Multiple Vitamins-Minerals (MULTIVITAMIN WITH MINERALS) tablet Take 1 tablet by mouth daily.   Yes [provider]  oxyCODONE-acetaminophen (PERCOCET) 7.5-325 MG tablet Take one tablet by mouth every 4 hours as needed for pain. Max APAP 3gm/24hrs 08/18/16  Yes Reed, Tiffany L, DO  pantoprazole (PROTONIX) 40 MG tablet Take 40 mg by mouth 2 (two) times daily.    Yes [provider]  phenylephrine-shark liver oil-mineral oil-petrolatum (PREPARATION H) 0.25-3-14-71.9 % rectal ointment Place 1 application rectally 2 (two) times daily as needed for hemorrhoids.   Yes [provider]  polyethylene glycol (MIRALAX / GLYCOLAX) packet Take 17 g by mouth daily as needed for mild constipation or moderate constipation.    Yes [provider]  potassium chloride (K-DUR,KLOR-CON) 10 MEQ tablet Take 10 mEq by mouth daily. Take 1 tablet by mouth once a day - take with Lasix   Yes [provider]  predniSONE (DELTASONE) 2.5 MG tablet Take 2.5 mg by mouth daily.   Yes [provider]  raloxifene (EVISTA) 60 MG tablet Take 60 mg by mouth daily.   Yes [provider]  rosuvastatin (CRESTOR) 20 MG tablet Take  20 mg by mouth daily.    Yes [provider]  methocarbamol (ROBAXIN) 500 MG tablet Take 1 tablet (500 mg total) by mouth 2 (two) times daily. Patient not taking: Reported on 03/31/2017 10/25/16   Long, Wonda Olds, MD  predniSONE (STERAPRED UNI-PAK 21 TAB) 10 MG (21) TBPK tablet Take by mouth daily. Take 6 tabs by mouth daily  for 2 days, then 5 tabs for 2 days, then 4 tabs for 2 days, then 3 tabs for 2 days, 2 tabs for 2 days, then 1 tab by mouth daily for 2 days Patient not taking: Reported on 03/31/2017 10/25/16   Long, Wonda Olds, MD  Family History Family History  Problem Relation Age of Onset  . Heart disease Father   . Kidney disease Mother        kidney cancer  . Colon cancer Daughter 17    Social History Social History   Tobacco Use  . Smoking status: Never Smoker  . Smokeless tobacco: Never Used  Substance Use Topics  . Alcohol use: No  . Drug use: No     Allergies   Other; Tape; Lipitor [atorvastatin calcium]; and Niaspan [niacin er]   Review of Systems Review of Systems  Unable to perform ROS: Dementia  Respiratory: Positive for cough.      Physical Exam Updated Vital Signs BP (!) 116/58   Pulse 74   Temp 98.3 F (36.8 C) (Oral)   Resp (!) 24   Ht 5' (1.524 m)   Wt 97.5 kg (215 lb)   SpO2 92%   BMI 41.99 kg/m    Patient Vitals for the past 24 hrs:  BP Temp Temp src Pulse Resp SpO2 Height Weight  03/31/17 2145 137/70 - - 72 18 93 % - -  03/31/17 2108 121/65 - - 67 18 - - -  03/31/17 2100 121/65 - - 71 (!) 21 94 % - -  03/31/17 2035 - - - - - 93 % - -  03/31/17 2034 - - - - - 97 % - -  03/31/17 2028 - - - - - 94 % - -  03/31/17 2026 - - - - - 98 % - -  03/31/17 2021 - - - - - 96 % - -  03/31/17 2015 110/67 - - 63 16 90 % - -  03/31/17 2003 - - - - - 92 % - -  03/31/17 2000 119/62 - - 68 17 91 % - -  03/31/17 1935 (!) 116/58 - - - - - - -  03/31/17 1834 - - - - - - 5' (1.524 m) 97.5 kg (215 lb)  03/31/17 1833 113/79 98.3 F (36.8 C) Oral  74 (!) 24 92 % - -  03/31/17 1832 - - - - - 94 % - -     Physical Exam 1935: Physical examination:  Nursing notes reviewed; Vital signs and O2 SAT reviewed;  Constitutional: Well developed, Well nourished, Well hydrated, Uncomfortable appearing.;; Head:  Normocephalic, atraumatic; Eyes: EOMI, PERRL, No scleral icterus; ENMT: Mouth and pharynx normal, Mucous membranes moist; Neck: Supple, Full range of motion, No lymphadenopathy; Cardiovascular: Irregular rate and rhythm, No gallop; Respiratory: Breath sounds diminished & equal bilaterally, insp/exp wheezes bilat. Faint audible wheezing. Near constant coughing during exam. Speaking short sentences, sitting upright, tachypneic.; Chest: Nontender, Movement normal; Abdomen: Soft, Nontender, Nondistended, Normal bowel sounds; Genitourinary: No CVA tenderness; Extremities: Pulses normal, No tenderness, +tr pedal edema bilat. No calf asymmetry.; Neuro: Awake, alert, confused per hx dementia. +HOH, otherwise major CN grossly intact. Speech clear. Moves all extremities spontaneously and to command without apparent gross focal motor deficits in extremities.; Skin: Color normal, Warm, Dry.   ED Treatments / Results  Labs (all labs ordered are listed, but only abnormal results are displayed)   EKG  EKG Interpretation  Date/Time:  Friday March 31 2017 19:44:45 EST Ventricular Rate:  68 PR Interval:    QRS Duration: 98 QT Interval:  554 QTC Calculation: 590 R Axis:   9 Text Interpretation:  Atrial fibrillation Low voltage, precordial leads Consider anterior infarct Prolonged QT interval When compared with ECG of 07/27/2016 QT has lengthened Confirmed  by Francine Graven [phone]) on 03/31/2017 7:53:53 PM       Radiology   Procedures Procedures (including critical care time)  Medications Ordered in ED Medications  albuterol (PROVENTIL,VENTOLIN) solution continuous neb (not administered)  ipratropium (ATROVENT) nebulizer solution 1 mg (not  administered)  methylPREDNISolone sodium succinate (SOLU-MEDROL) 125 mg/2 mL injection 125 mg (125 mg Intravenous Given 03/31/17 2000)     Initial Impression / Assessment and Plan / ED Course  I have reviewed the triage vital signs and the nursing notes.  Pertinent labs & imaging results that were available during my care of the patient were reviewed by me and considered in my medical decision making (see chart for details).  MDM Reviewed: previous chart, nursing note and vitals Reviewed previous: labs and ECG Interpretation: labs, ECG and x-ray Total time providing critical care: 30-74 minutes. This excludes time spent performing separately reportable procedures and services. Consults: admitting MD   CRITICAL CARE Performed by: Alfonzo Feller Total critical care time: 35 minutes Critical care time was exclusive of separately billable procedures and treating other patients. Critical care was necessary to treat or prevent imminent or life-threatening deterioration. Critical care was time spent personally by me on the following activities: development of treatment plan with patient and/or surrogate as well as nursing, discussions with consultants, evaluation of patient's response to treatment, examination of patient, obtaining history from patient or surrogate, ordering and performing treatments and interventions, ordering and review of laboratory studies, ordering and review of radiographic studies, pulse oximetry and re-evaluation of patient's condition.   Results for orders placed or performed during the hospital encounter of 03/31/17  CBC with Differential  Result Value Ref Range   WBC 9.2 4.0 - 10.5 K/uL   RBC 5.00 3.87 - 5.11 MIL/uL   Hemoglobin 13.4 12.0 - 15.0 g/dL   HCT 44.7 36.0 - 46.0 %   MCV 89.4 78.0 - 100.0 fL   MCH 26.8 26.0 - 34.0 pg   MCHC 30.0 30.0 - 36.0 g/dL   RDW 16.1 (H) 11.5 - 15.5 %   Platelets 206 150 - 400 K/uL   Neutrophils Relative % 69 %   Neutro  Abs 6.4 1.7 - 7.7 K/uL   Lymphocytes Relative 21 %   Lymphs Abs 2.0 0.7 - 4.0 K/uL   Monocytes Relative 7 %   Monocytes Absolute 0.6 0.1 - 1.0 K/uL   Eosinophils Relative 3 %   Eosinophils Absolute 0.2 0.0 - 0.7 K/uL   Basophils Relative 0 %   Basophils Absolute 0.0 0.0 - 0.1 K/uL  Comprehensive metabolic panel  Result Value Ref Range   Sodium 142 135 - 145 mmol/L   Potassium 3.5 3.5 - 5.1 mmol/L   Chloride 102 101 - 111 mmol/L   CO2 29 22 - 32 mmol/L   Glucose, Bld 165 (H) 65 - 99 mg/dL   BUN 17 6 - 20 mg/dL   Creatinine, Ser 1.29 (H) 0.44 - 1.00 mg/dL   Calcium 9.7 8.9 - 10.3 mg/dL   Total Protein 6.9 6.5 - 8.1 g/dL   Albumin 3.2 (L) 3.5 - 5.0 g/dL   AST 30 15 - 41 U/L   ALT 17 14 - 54 U/L   Alkaline Phosphatase 70 38 - 126 U/L   Total Bilirubin 0.7 0.3 - 1.2 mg/dL   GFR calc non Af Amer 36 (L) >60 mL/min   GFR calc Af Amer 42 (L) >60 mL/min   Anion gap 11 5 - 15  Lactic acid, plasma  Result Value Ref Range   Lactic Acid, Venous 1.6 0.5 - 1.9 mmol/L  Lactic acid, plasma  Result Value Ref Range   Lactic Acid, Venous 1.7 0.5 - 1.9 mmol/L  Troponin I  Result Value Ref Range   Troponin I <0.03 <0.03 ng/mL  Brain natriuretic peptide  Result Value Ref Range   B Natriuretic Peptide 117.0 (H) 0.0 - 100.0 pg/mL  Magnesium  Result Value Ref Range   Magnesium 2.0 1.7 - 2.4 mg/dL   Dg Chest 2 View Result Date: 03/31/2017 CLINICAL DATA:  Cough. EXAM: CHEST  2 VIEW COMPARISON:  07/26/2016 and prior radiographs FINDINGS: Cardiomegaly again noted. Bibasilar opacities are present-question atelectasis versus pneumonia. No pleural effusion or pneumothorax. No acute bony abnormalities are noted. IMPRESSION: Bibasilar opacities-question atelectasis versus pneumonia. Cardiomegaly. Electronically Signed   By: Margarette Canada M.D.   On: 03/31/2017 19:34    2205:  On arrival: pt sitting upright, tachypneic, Sats high 80's % R/A, lungs diminished with wheezing bilat. IV solumedrol and hour long  neb started. After neb: pt appears more comfortable at rest, less tachypneic, Sats 92 % on O2 2.5L N/C, lungs continue diminished. Pt performed her usual activity level (stand and pivot to chair at bedside): pt's O2 Sats dropped to 82 % on O2 2.5L N/C with pt c/o increasing SOB, with increasing HR and RR. Pt sat in chair with O2 Sats slowly increasing to 92 % on O2 2.5L N/C.  T/C to [facility]. Olevia Bowens, case discussed, including:  HPI, pertinent PM/SHx, VS/PE, dx testing, ED course and treatment:  Agreeable to admit.       Final Clinical Impressions(s) / ED Diagnoses   Final diagnoses:  None    ED Discharge Orders    None        Francine Graven, DO 04/02/17 1342

## 2017-04-01 DIAGNOSIS — J441 Chronic obstructive pulmonary disease with (acute) exacerbation: Principal | ICD-10-CM

## 2017-04-01 DIAGNOSIS — I4891 Unspecified atrial fibrillation: Secondary | ICD-10-CM

## 2017-04-01 DIAGNOSIS — E1122 Type 2 diabetes mellitus with diabetic chronic kidney disease: Secondary | ICD-10-CM

## 2017-04-01 DIAGNOSIS — I10 Essential (primary) hypertension: Secondary | ICD-10-CM

## 2017-04-01 DIAGNOSIS — N183 Chronic kidney disease, stage 3 (moderate): Secondary | ICD-10-CM | POA: Diagnosis not present

## 2017-04-01 DIAGNOSIS — J9621 Acute and chronic respiratory failure with hypoxia: Secondary | ICD-10-CM | POA: Diagnosis not present

## 2017-04-01 DIAGNOSIS — F0390 Unspecified dementia without behavioral disturbance: Secondary | ICD-10-CM

## 2017-04-01 DIAGNOSIS — R739 Hyperglycemia, unspecified: Secondary | ICD-10-CM

## 2017-04-01 DIAGNOSIS — J9622 Acute and chronic respiratory failure with hypercapnia: Secondary | ICD-10-CM

## 2017-04-01 LAB — BASIC METABOLIC PANEL
ANION GAP: 11 (ref 5–15)
BUN: 19 mg/dL (ref 6–20)
CO2: 27 mmol/L (ref 22–32)
Calcium: 9.7 mg/dL (ref 8.9–10.3)
Chloride: 99 mmol/L — ABNORMAL LOW (ref 101–111)
Creatinine, Ser: 1.28 mg/dL — ABNORMAL HIGH (ref 0.44–1.00)
GFR calc Af Amer: 43 mL/min — ABNORMAL LOW (ref >60)
GFR calc non Af Amer: 37 mL/min — ABNORMAL LOW (ref >60)
GLUCOSE: 342 mg/dL — AB (ref 65–99)
POTASSIUM: 3.9 mmol/L (ref 3.5–5.1)
Sodium: 137 mmol/L (ref 135–145)

## 2017-04-01 LAB — CBC
HEMATOCRIT: 43.6 % (ref 36.0–46.0)
HEMOGLOBIN: 13.2 g/dL (ref 12.0–15.0)
MCH: 27.1 pg (ref 26.0–34.0)
MCHC: 30.3 g/dL (ref 30.0–36.0)
MCV: 89.5 fL (ref 78.0–100.0)
Platelets: 174 10*3/uL (ref 150–400)
RBC: 4.87 MIL/uL (ref 3.87–5.11)
RDW: 15.9 % — ABNORMAL HIGH (ref 11.5–15.5)
WBC: 6.3 10*3/uL (ref 4.0–10.5)

## 2017-04-01 LAB — GLUCOSE, CAPILLARY
GLUCOSE-CAPILLARY: 358 mg/dL — AB (ref 65–99)
GLUCOSE-CAPILLARY: 220 mg/dL — AB (ref 65–99)
Glucose-Capillary: 273 mg/dL — ABNORMAL HIGH (ref 65–99)
Glucose-Capillary: 256 mg/dL — ABNORMAL HIGH (ref 65–99)

## 2017-04-01 LAB — PROCALCITONIN: Procalcitonin: <0.1 ng/mL

## 2017-04-01 MED ORDER — ALBUTEROL SULFATE (2.5 MG/3ML) 0.083% IN NEBU
2.5000 mg | INHALATION_SOLUTION | RESPIRATORY_TRACT | Status: DC | PRN
Start: 2017-04-01 — End: 2017-04-03

## 2017-04-01 MED ORDER — INSULIN ASPART 100 UNIT/ML ~~LOC~~ SOLN
0.0000 [IU] | Freq: Three times a day (TID) | SUBCUTANEOUS | Status: DC
  Administered 2017-04-01: 5 [IU] via SUBCUTANEOUS

## 2017-04-01 MED ORDER — INSULIN ASPART 100 UNIT/ML ~~LOC~~ SOLN
0.0000 [IU] | Freq: Three times a day (TID) | SUBCUTANEOUS | Status: DC
  Administered 2017-04-01: 15 [IU] via SUBCUTANEOUS
  Administered 2017-04-01 – 2017-04-02 (×2): 5 [IU] via SUBCUTANEOUS
  Administered 2017-04-02: 3 [IU] via SUBCUTANEOUS

## 2017-04-01 MED ORDER — ALBUTEROL SULFATE (2.5 MG/3ML) 0.083% IN NEBU: 2.5000 mg | INHALATION_SOLUTION | Freq: Four times a day (QID) | RESPIRATORY_TRACT | Status: DC

## 2017-04-01 MED ORDER — INSULIN ASPART 100 UNIT/ML ~~LOC~~ SOLN
0.0000 [IU] | Freq: Every day | SUBCUTANEOUS | Status: DC
  Administered 2017-04-01: 3 [IU] via SUBCUTANEOUS

## 2017-04-01 MED ORDER — METHYLPREDNISOLONE SODIUM SUCC 40 MG IJ SOLR
40.0000 mg | Freq: Four times a day (QID) | INTRAMUSCULAR | Status: AC
  Administered 2017-04-01 (×4): 40 mg via INTRAVENOUS
  Filled 2017-04-01 (×4): qty 1

## 2017-04-01 MED ORDER — IPRATROPIUM BROMIDE 0.02 % IN SOLN: 0.5000 mg | Freq: Four times a day (QID) | RESPIRATORY_TRACT | Status: DC

## 2017-04-01 MED ORDER — BUDESONIDE 0.5 MG/2ML IN SUSP
0.5000 mg | Freq: Two times a day (BID) | RESPIRATORY_TRACT | Status: DC
  Administered 2017-04-01 – 2017-04-03 (×5): 0.5 mg via RESPIRATORY_TRACT
  Filled 2017-04-01 (×5): qty 2

## 2017-04-01 MED ORDER — DOXYCYCLINE HYCLATE 100 MG PO TABS: 100.0000 mg | ORAL_TABLET | Freq: Two times a day (BID) | ORAL | Status: DC

## 2017-04-01 MED ORDER — ACETAMINOPHEN 325 MG PO TABS
650.0000 mg | ORAL_TABLET | Freq: Four times a day (QID) | ORAL | Status: DC | PRN
  Administered 2017-04-01 – 2017-04-03 (×2): 650 mg via ORAL
  Filled 2017-04-01 (×2): qty 2

## 2017-04-01 MED ORDER — ORAL CARE MOUTH RINSE
15.0000 mL | Freq: Two times a day (BID) | OROMUCOSAL | Status: DC
  Administered 2017-04-01 – 2017-04-02 (×3): 15 mL via OROMUCOSAL

## 2017-04-01 MED ORDER — IPRATROPIUM-ALBUTEROL 0.5-2.5 (3) MG/3ML IN SOLN
3.0000 mL | Freq: Four times a day (QID) | RESPIRATORY_TRACT | Status: DC
  Administered 2017-04-01 – 2017-04-02 (×7): 3 mL via RESPIRATORY_TRACT
  Filled 2017-04-01 (×8): qty 3

## 2017-04-01 NOTE — Progress Notes (Signed)
PROGRESS NOTE  [name] TSV:[id number] DOB: 12-09-1928 DOA: 03/31/2017 PCP: Celene Squibb, MD  Brief History:  84 year old female with history of atrial fibrillation, COPD, chronic respiratory failure on oxygen at nighttime, dementia, hypertension, hyperlipidemia presenting with 1 week history of shortness of breath, coughing with yellow sputum.  Unfortunately, the patient is a poor historian.  Her family at the bedside is not able to contribute any more significant history.  There is no report of fevers, nausea, vomiting, diarrhea, dysuria, hematuria.  Apparently, the patient was noted to be hypoxic and short of breath at her assisted living facility.  Therefore, the patient was sent to the emergency department for further evaluation.  Chest x-ray showed bibasilar opacities suggestive of atelectasis versus pneumonia.  The patient was started on bronchodilators and Solu-Medrol.  Assessment/Plan: Acute on chronic respiratory failure with hypoxia -Normally on nighttime oxygen--family cannot clarify how much -Presently stable on 2.5 L -Secondary COPD exacerbation -Wean oxygen as tolerated for saturation greater than 92%  COPD exacerbation -Viral respiratory panel -Check procalcitonin -Start Pulmicort -Continue duo nebs -personally reviewed CXR--chronic interstitial markings  Atrial fibrillation, type unspecified -Rate controlled -Continue diltiazem CD -Personally reviewed EKG--atrial fibrillation with nonspecific T wave changes -Continue apixaban  CKD stage III -Baseline creatinine 1.1-1.3 -A.m. BMP  Essential hypertension -Continue diltiazem -Controlled  Depression/anxiety -Continue Cymbalta and Ativan at home doses  Fibromyalgia -Continue Lyrica -Continue home dose Percocet  Hyperglycemia -NovoLog sliding scale -Hemoglobin A1c    Disposition Plan:   ALF in 2-3 days  Family Communication:   Family at bedside updated 1/19--Total time spent 35 minutes.   Greater than 50% spent face to face counseling and coordinating care.   Consultants:  none  Code Status:  FULL   DVT Prophylaxis:  apixaban   Procedures: As Listed in Progress Note Above  Antibiotics: None    Subjective: Patient states that her breathing is only slightly better than yesterday.  She remains short of breath at rest.  She complains of a nonproductive cough.  She denies any fevers, chills, chest pain, nausea, vomiting, diarrhea, abdominal pain, dysuria, hematuria.  There is no neck pain.  Objective: Vitals:   04/01/17 0019 04/01/17 0135 04/01/17 0533 04/01/17 0825  BP:   (!) 134/59   Pulse:   81   Resp:   18   Temp:   98 F (36.7 C)   TempSrc:   Oral   SpO2: 94% 94% 96% 95%  Weight:      Height:        Intake/Output Summary (Last 24 hours) at 04/01/2017 1111 Last data filed at 04/01/2017 0800 Gross per 24 hour  Intake 480 ml  Output 350 ml  Net 130 ml   Weight change:  Exam:   General:  Pt is alert, follows commands appropriately, not in acute distress  HEENT: No icterus, No thrush, No neck mass, [place]/AT  Cardiovascular: RRR, S1/S2, no rubs, no gallops  Respiratory: Bilateral scattered rales.  Bibasilar wheezing.  Good air movement.  Abdomen: Soft/+BS, non tender, non distended, no guarding  Extremities: 1 + LE edema, No lymphangitis, No petechiae, No rashes, no synovitis   Data Reviewed: I have personally reviewed following labs and imaging studies Basic Metabolic Panel: Recent Labs  Lab 03/31/17 1852 04/01/17 0633  NA 142 137  K 3.5 3.9  CL 102 99*  CO2 29 27  GLUCOSE 165* 342*  BUN 17 19  CREATININE 1.29* 1.28*  CALCIUM  9.7 9.7  MG 2.0  --   PHOS 3.0  --    Liver Function Tests: Recent Labs  Lab 03/31/17 1852  AST 30  ALT 17  ALKPHOS 70  BILITOT 0.7  PROT 6.9  ALBUMIN 3.2*   No results for input(s): LIPASE, AMYLASE in the last 168 hours. No results for input(s): AMMONIA in the last 168 hours. Coagulation  Profile: No results for input(s): INR, PROTIME in the last 168 hours. CBC: Recent Labs  Lab 03/31/17 1852 04/01/17 0633  WBC 9.2 6.3  NEUTROABS 6.4  --   HGB 13.4 13.2  HCT 44.7 43.6  MCV 89.4 89.5  PLT 206 174   Cardiac Enzymes: Recent Labs  Lab 03/31/17 1852  TROPONINI <0.03   BNP: Invalid input(s): POCBNP CBG: Recent Labs  Lab 04/01/17 0732  GLUCAP 273*   HbA1C: No results for input(s): HGBA1C in the last 72 hours. Urine analysis:    Component Value Date/Time   COLORURINE YELLOW 10/25/2016 1848   APPEARANCEUR HAZY (A) 10/25/2016 1848   LABSPEC 1.015 10/25/2016 1848   PHURINE 6.0 10/25/2016 1848   GLUCOSEU 50 (A) 10/25/2016 1848   HGBUR NEGATIVE 10/25/2016 1848   BILIRUBINUR NEGATIVE 10/25/2016 1848   KETONESUR NEGATIVE 10/25/2016 1848   PROTEINUR NEGATIVE 10/25/2016 1848   UROBILINOGEN 1.0 10/11/2014 1428   NITRITE NEGATIVE 10/25/2016 1848   LEUKOCYTESUR NEGATIVE 10/25/2016 1848   Sepsis Labs: @LABRCNTIP (procalcitonin:4,lacticidven:4) )No results found for this or any previous visit (from the past 240 hour(s)).   Scheduled Meds: . apixaban  2.5 mg Oral BID  . cholecalciferol  1,000 Units Oral Daily  . diltiazem  300 mg Oral Daily  . DULoxetine  60 mg Oral Daily  . furosemide  20 mg Oral Daily  . insulin aspart  0-9 Units Subcutaneous TID WC  . ipratropium-albuterol  3 mL Nebulization Q6H  . LORazepam  0.25 mg Oral BID  . mouth rinse  15 mL Mouth Rinse BID  . methylPREDNISolone (SOLU-MEDROL) injection  40 mg Intravenous Q6H  . pantoprazole  40 mg Oral BID  . pregabalin  100 mg Oral TID   Continuous Infusions:  Procedures/Studies: Dg Chest 2 View  Result Date: 03/31/2017 CLINICAL DATA:  Cough. EXAM: CHEST  2 VIEW COMPARISON:  07/26/2016 and prior radiographs FINDINGS: Cardiomegaly again noted. Bibasilar opacities are present-question atelectasis versus pneumonia. No pleural effusion or pneumothorax. No acute bony abnormalities are noted.  IMPRESSION: Bibasilar opacities-question atelectasis versus pneumonia. Cardiomegaly. Electronically Signed   By: Margarette Canada M.D.   On: 03/31/2017 19:34    Orson Eva, DO  [facility] Pager [phone]  If 7PM-7AM, please contact night-coverage [email or web address] Password [email or web address] 04/01/2017, 11:11 AM   LOS: 0 days

## 2017-04-01 NOTE — Progress Notes (Signed)
Nutrition Brief Note  Nutritional Screen performed as part of COPD Gold protocol.   Wt Readings from Last 15 Encounters:  04/01/17 216 lb 7.9 oz (98.2 kg)  10/25/16 215 lb (97.5 kg)  10/21/16 215 lb (97.5 kg)  07/26/16 215 lb (97.5 kg)  02/16/16 215 lb (97.5 kg)  10/11/14 215 lb (97.5 kg)  09/13/14 214 lb 8.1 oz (97.3 kg)  09/11/13 211 lb (95.7 kg)  08/07/13 211 lb (95.7 kg)  07/30/13 214 lb 11.7 oz (97.4 kg)  07/23/13 216 lb 7.9 oz (98.2 kg)  11/23/11 219 lb (99.3 kg)  10/16/11 215 lb (97.5 kg)  09/08/11 215 lb (97.5 kg)  09/08/11 215 lb (97.5 kg)   Body mass index is 42.28 kg/m. Patient meets criteria for Morbidly Obese based on current BMI.   RD operating remotely. Per review of chart, patient has dementia and is resident of ALF. Presented with dyspnea and productive cough. There was no report of any poor intake, decreased appetite or symptoms of GI distress (n/v/c/d)  Her weight appears to have been incredibly stable over the last 5 years, at 210-220.   Current diet order is heart healthy/carb modified. There is no documented PO intake at this time. Labs and medications reviewed.   Patient not felt to be at nutritional risk at this time given extremely stable weight and no report of any issues eating or GI distress.  No nutrition interventions warranted at this time. If nutrition issues arise, please consult RD.   Burtis Junes RD, LDN, CNSC Clinical Nutrition Pager: [phone] 04/01/2017 8:47 AM

## 2017-04-02 DIAGNOSIS — J441 Chronic obstructive pulmonary disease with (acute) exacerbation: Secondary | ICD-10-CM | POA: Diagnosis not present

## 2017-04-02 DIAGNOSIS — I4891 Unspecified atrial fibrillation: Secondary | ICD-10-CM | POA: Diagnosis not present

## 2017-04-02 DIAGNOSIS — J9621 Acute and chronic respiratory failure with hypoxia: Secondary | ICD-10-CM | POA: Diagnosis not present

## 2017-04-02 DIAGNOSIS — F0390 Unspecified dementia without behavioral disturbance: Secondary | ICD-10-CM | POA: Diagnosis not present

## 2017-04-02 DIAGNOSIS — I10 Essential (primary) hypertension: Secondary | ICD-10-CM | POA: Diagnosis not present

## 2017-04-02 LAB — BASIC METABOLIC PANEL
ANION GAP: 10 (ref 5–15)
BUN: 27 mg/dL — ABNORMAL HIGH (ref 6–20)
CALCIUM: 10.4 mg/dL — AB (ref 8.9–10.3)
CHLORIDE: 102 mmol/L (ref 101–111)
CO2: 28 mmol/L (ref 22–32)
Creatinine, Ser: 1.35 mg/dL — ABNORMAL HIGH (ref 0.44–1.00)
GFR calc Af Amer: 40 mL/min — ABNORMAL LOW (ref >60)
GFR calc non Af Amer: 34 mL/min — ABNORMAL LOW (ref >60)
Glucose, Bld: 241 mg/dL — ABNORMAL HIGH (ref 65–99)
Potassium: 4.1 mmol/L (ref 3.5–5.1)
Sodium: 140 mmol/L (ref 135–145)

## 2017-04-02 LAB — RESPIRATORY PANEL BY PCR
ADENOVIRUS-RVPPCR: NOT DETECTED
Bordetella pertussis: NOT DETECTED
CHLAMYDOPHILA PNEUMONIAE-RVPPCR: NOT DETECTED
CORONAVIRUS NL63-RVPPCR: NOT DETECTED
CORONAVIRUS OC43-RVPPCR: NOT DETECTED
Coronavirus 229E: NOT DETECTED
Coronavirus HKU1: NOT DETECTED
Influenza A: NOT DETECTED
Influenza B: NOT DETECTED
METAPNEUMOVIRUS-RVPPCR: NOT DETECTED
Mycoplasma pneumoniae: NOT DETECTED
PARAINFLUENZA VIRUS 1-RVPPCR: NOT DETECTED
PARAINFLUENZA VIRUS 2-RVPPCR: NOT DETECTED
PARAINFLUENZA VIRUS 3-RVPPCR: NOT DETECTED
PARAINFLUENZA VIRUS 4-RVPPCR: NOT DETECTED
RHINOVIRUS / ENTEROVIRUS - RVPPCR: NOT DETECTED
Respiratory Syncytial Virus: NOT DETECTED

## 2017-04-02 LAB — GLUCOSE, CAPILLARY
GLUCOSE-CAPILLARY: 198 mg/dL — AB (ref 65–99)
GLUCOSE-CAPILLARY: 186 mg/dL — AB (ref 65–99)
Glucose-Capillary: 204 mg/dL — ABNORMAL HIGH (ref 65–99)
Glucose-Capillary: 198 mg/dL — ABNORMAL HIGH (ref 65–99)

## 2017-04-02 LAB — HEMOGLOBIN A1C
Hgb A1c MFr Bld: 7.6 % — ABNORMAL HIGH (ref 4.8–5.6)
Mean Plasma Glucose: 171.42 mg/dL

## 2017-04-02 LAB — MAGNESIUM: MAGNESIUM: 2.1 mg/dL (ref 1.7–2.4)

## 2017-04-02 MED ORDER — IPRATROPIUM-ALBUTEROL 0.5-2.5 (3) MG/3ML IN SOLN
3.0000 mL | Freq: Three times a day (TID) | RESPIRATORY_TRACT | Status: DC
  Administered 2017-04-03: 3 mL via RESPIRATORY_TRACT
  Filled 2017-04-02: qty 3

## 2017-04-02 MED ORDER — METHYLPREDNISOLONE SODIUM SUCC 125 MG IJ SOLR
60.0000 mg | Freq: Once | INTRAMUSCULAR | Status: AC
  Administered 2017-04-02: 60 mg via INTRAVENOUS
  Filled 2017-04-02: qty 2

## 2017-04-02 MED ORDER — INSULIN ASPART 100 UNIT/ML ~~LOC~~ SOLN: 0.0000 [IU] | Freq: Every day | SUBCUTANEOUS | Status: DC

## 2017-04-02 MED ORDER — IPRATROPIUM-ALBUTEROL 0.5-2.5 (3) MG/3ML IN SOLN: 3.0000 mL | Freq: Four times a day (QID) | RESPIRATORY_TRACT | 0 refills | Status: DC

## 2017-04-02 MED ORDER — INSULIN ASPART 100 UNIT/ML ~~LOC~~ SOLN
0.0000 [IU] | Freq: Three times a day (TID) | SUBCUTANEOUS | Status: DC
  Administered 2017-04-02: 4 [IU] via SUBCUTANEOUS
  Administered 2017-04-03 (×2): 7 [IU] via SUBCUTANEOUS

## 2017-04-02 MED ORDER — PREDNISONE 20 MG PO TABS
60.0000 mg | ORAL_TABLET | Freq: Every day | ORAL | Status: DC
  Administered 2017-04-03: 60 mg via ORAL
  Filled 2017-04-02: qty 3

## 2017-04-02 MED ORDER — SODIUM CHLORIDE 0.9 % IV SOLN
INTRAVENOUS | Status: AC
  Administered 2017-04-02: 18:00:00 via INTRAVENOUS

## 2017-04-02 NOTE — Progress Notes (Signed)
PROGRESS NOTE  [name] DJM:[id number] DOB: Mar 05, 1929 DOA: 03/31/2017 PCP: Celene Squibb, MD  Brief History:  84 year old female with history of atrial fibrillation, COPD, chronic respiratory failure on oxygen at nighttime, dementia, hypertension, hyperlipidemia presenting with 1 week history of shortness of breath, coughing with yellow sputum.  Unfortunately, the patient is a poor historian.  Her family at the bedside is not able to contribute any more significant history.  There is no report of fevers, nausea, vomiting, diarrhea, dysuria, hematuria.  Apparently, the patient was noted to be hypoxic and short of breath at her assisted living facility.  Therefore, the patient was sent to the emergency department for further evaluation.  Chest x-ray showed bibasilar opacities suggestive of atelectasis versus pneumonia.  The patient was started on bronchodilators and Solu-Medrol.  Assessment/Plan: Acute on chronic respiratory failure with hypoxia -Normally on nighttime oxygen--family cannot clarify how much -Presently stable on 2L -Secondary COPD exacerbation -Wean oxygen as tolerated for saturation greater than 92%  COPD exacerbation -Viral respiratory panel--neg -Check procalcitonin--<0.10 -Continue Pulmicort -Continue duo nebs -personally reviewed CXR--chronic interstitial markings  Hypercalcemia -likely due to immobility -start IVF -am BMP -d/c vitamin D  Atrial fibrillation, type unspecified -Rate controlled -Continue diltiazem CD -Personally reviewed EKG--atrial fibrillation with nonspecific T wave changes -Continue apixaban  CKD stage III -Baseline creatinine 1.1-1.3 -A.m. BMP  Essential hypertension -Continue diltiazem -Controlled  Depression/anxiety -Continue Cymbalta and Ativan at home doses  Fibromyalgia -Continue Lyrica -Continue home dose Percocet  Diabetes mellitus type 2 -NovoLog sliding scale---Start resistant  scale -Hemoglobin A1c--7.6 -Not on any agents in the outpatient setting     Disposition Plan:   ALF 1/21 or 1/22 Family Communication:   None today  Consultants:  none  Code Status:  FULL   DVT Prophylaxis:  apixaban   Procedures: As Listed in Progress Note Above  Antibiotics: None     Subjective: Patient states that she is breathing slightly better.  She denies any nausea, vomiting, diarrhea, abdominal pain, headache, neck pain.  There is no fevers or chills.  She still has the cough and congestion in her chest.  Objective: Vitals:   04/01/17 2116 04/02/17 0602 04/02/17 0802 04/02/17 1335  BP: 131/67 (!) 135/58    Pulse: 70 64    Resp: 20 20    Temp: 97.8 F (36.6 C) 97.8 F (36.6 C)    TempSrc: Oral Oral    SpO2: 95% 96% 97% 98%  Weight:      Height:        Intake/Output Summary (Last 24 hours) at 04/02/2017 1607 Last data filed at 04/02/2017 1432 Gross per 24 hour  Intake 720 ml  Output 500 ml  Net 220 ml   Weight change:  Exam:   General:  Pt is alert, follows commands appropriately, not in acute distress  HEENT: No icterus, No thrush, No neck mass, [place]/AT  Cardiovascular: RRR, S1/S2, no rubs, no gallops  Respiratory: Bilateral rales.  Bibasilar expiratory wheeze.  Good air movement.  Abdomen: Soft/+BS, non tender, non distended, no guarding  Extremities: 1+LE edema, No lymphangitis, No petechiae, No rashes, no synovitis   Data Reviewed: I have personally reviewed following labs and imaging studies Basic Metabolic Panel: Recent Labs  Lab 03/31/17 1852 04/01/17 0633 04/02/17 0627  NA 142 137 140  K 3.5 3.9 4.1  CL 102 99* 102  CO2 29 27 28   GLUCOSE 165* 342* 241*  BUN 17 19 27*  CREATININE 1.29* 1.28* 1.35*  CALCIUM 9.7 9.7 10.4*  MG 2.0  --  2.1  PHOS 3.0  --   --    Liver Function Tests: Recent Labs  Lab 03/31/17 1852  AST 30  ALT 17  ALKPHOS 70  BILITOT 0.7  PROT 6.9  ALBUMIN 3.2*   No results for input(s):  LIPASE, AMYLASE in the last 168 hours. No results for input(s): AMMONIA in the last 168 hours. Coagulation Profile: No results for input(s): INR, PROTIME in the last 168 hours. CBC: Recent Labs  Lab 03/31/17 1852 04/01/17 0633  WBC 9.2 6.3  NEUTROABS 6.4  --   HGB 13.4 13.2  HCT 44.7 43.6  MCV 89.4 89.5  PLT 206 174   Cardiac Enzymes: Recent Labs  Lab 03/31/17 1852  TROPONINI <0.03   BNP: Invalid input(s): POCBNP CBG: Recent Labs  Lab 04/01/17 1140 04/01/17 1622 04/01/17 2114 04/02/17 0759 04/02/17 1126  GLUCAP 358* 220* 256* 204* 198*   HbA1C: Recent Labs    04/02/17 0627  HGBA1C 7.6*   Urine analysis:    Component Value Date/Time   COLORURINE YELLOW 10/25/2016 1848   APPEARANCEUR HAZY (A) 10/25/2016 1848   LABSPEC 1.015 10/25/2016 1848   PHURINE 6.0 10/25/2016 1848   GLUCOSEU 50 (A) 10/25/2016 1848   HGBUR NEGATIVE 10/25/2016 1848   BILIRUBINUR NEGATIVE 10/25/2016 1848   KETONESUR NEGATIVE 10/25/2016 1848   PROTEINUR NEGATIVE 10/25/2016 1848   UROBILINOGEN 1.0 10/11/2014 1428   NITRITE NEGATIVE 10/25/2016 1848   LEUKOCYTESUR NEGATIVE 10/25/2016 1848   Sepsis Labs: @LABRCNTIP (procalcitonin:4,lacticidven:4) ) Recent Results (from the past 240 hour(s))  Respiratory Panel by PCR     Status: None   Collection Time: 04/01/17 11:20 AM  Result Value Ref Range Status   Adenovirus NOT DETECTED NOT DETECTED Final   Coronavirus 229E NOT DETECTED NOT DETECTED Final   Coronavirus HKU1 NOT DETECTED NOT DETECTED Final   Coronavirus NL63 NOT DETECTED NOT DETECTED Final   Coronavirus OC43 NOT DETECTED NOT DETECTED Final   Metapneumovirus NOT DETECTED NOT DETECTED Final   Rhinovirus / Enterovirus NOT DETECTED NOT DETECTED Final   Influenza A NOT DETECTED NOT DETECTED Final   Influenza B NOT DETECTED NOT DETECTED Final   Parainfluenza Virus 1 NOT DETECTED NOT DETECTED Final   Parainfluenza Virus 2 NOT DETECTED NOT DETECTED Final   Parainfluenza Virus 3 NOT  DETECTED NOT DETECTED Final   Parainfluenza Virus 4 NOT DETECTED NOT DETECTED Final   Respiratory Syncytial Virus NOT DETECTED NOT DETECTED Final   Bordetella pertussis NOT DETECTED NOT DETECTED Final   Chlamydophila pneumoniae NOT DETECTED NOT DETECTED Final   Mycoplasma pneumoniae NOT DETECTED NOT DETECTED Final    Comment: Performed at [facility], [place] [address], [place], [place] [address]     Scheduled Meds: . apixaban  2.5 mg Oral BID  . budesonide (PULMICORT) nebulizer solution  0.5 mg Nebulization BID  . cholecalciferol  1,000 Units Oral Daily  . diltiazem  300 mg Oral Daily  . DULoxetine  60 mg Oral Daily  . furosemide  20 mg Oral Daily  . insulin aspart  0-15 Units Subcutaneous TID WC  . insulin aspart  0-5 Units Subcutaneous QHS  . ipratropium-albuterol  3 mL Nebulization Q6H  . LORazepam  0.25 mg Oral BID  . mouth rinse  15 mL Mouth Rinse BID  . pantoprazole  40 mg Oral BID  . pregabalin  100 mg Oral TID   Continuous Infusions:  Procedures/Studies: Dg  Chest 2 View  Result Date: 03/31/2017 CLINICAL DATA:  Cough. EXAM: CHEST  2 VIEW COMPARISON:  07/26/2016 and prior radiographs FINDINGS: Cardiomegaly again noted. Bibasilar opacities are present-question atelectasis versus pneumonia. No pleural effusion or pneumothorax. No acute bony abnormalities are noted. IMPRESSION: Bibasilar opacities-question atelectasis versus pneumonia. Cardiomegaly. Electronically Signed   By: Margarette Canada M.D.   On: 03/31/2017 19:34    Orson Eva, DO  [facility] Pager [phone]  If 7PM-7AM, please contact night-coverage [email or web address] Password [email or web address] 04/02/2017, 4:07 PM   LOS: 0 days

## 2017-04-02 NOTE — Discharge Summary (Addendum)
Physician Discharge Summary  [name] HUD:[id number] DOB: 06-24-1928 DOA: 03/31/2017  PCP: Celene Squibb, MD  Admit date: 03/31/2017 Discharge date: 04/03/2017  Admitted From: ALF Disposition:  ALF  Recommendations for Outpatient Follow-up:  1. Follow up with PCP in 1-2 weeks 2. Please obtain BMP/CBC in one week   Home Health:YES--HHPT and oxygen Equipment/Devices: 2L  Discharge Condition: Stable CODE STATUS: FULL Diet recommendation: Heart Healthy / Carb Modified   Brief/Interim Summary: 84 year old female with history of atrial fibrillation, COPD, chronic respiratory failure on oxygen at nighttime, dementia, hypertension, hyperlipidemia presenting with 1 week history of shortness of breath, coughing with yellow sputum. Unfortunately, the patient is a poor historian. Her family at the bedside is not able to contribute any more significant history. There is no report of fevers, nausea, vomiting, diarrhea, dysuria, hematuria. Apparently, the patient was noted to be hypoxic and short of breath at her assisted living facility. Therefore, the patient was sent to the emergency department for further evaluation. Chest x-ray showed bibasilar opacities suggestive of atelectasis versus pneumonia. The patient was started on bronchodilators and Solu-Medrol.  She showed clinical improvement, but had oxygen desaturation on room air on the day of discharge.  She was set up with home oxygen.    Discharge Diagnoses:  Acute on chronic respiratory failure with hypoxia -Normally on nighttime oxygen--family cannot clarify how much -Presently stable on 2L -Secondary COPD exacerbation -Wean oxygen as tolerated for saturation greater than 92% -Patient desaturated to less than 88% with activity on room air.  The patient will be set up with home oxygen-->2L.  COPD exacerbation -Viral respiratory panel--neg -Check procalcitonin--<0.10 -Continued Pulmicort during the  hospitalization -Continue duo nebs -personally reviewed CXR--chronic interstitial markings -continue IV solumedrol>>prednisone taper  Hypercalcemia--mild -likely due to immobility -started IVF -am BMP -d/c vitamin D  Atrial fibrillation, type unspecified -Rate controlled -Continue diltiazem CD -Personally reviewed EKG--atrial fibrillation with nonspecific T wave changes -Continue apixaban  CKD stage III -Baseline creatinine 1.1-1.3 -A.m. BMP  Essential hypertension -Continue diltiazem -Controlled  Depression/anxiety -Continue Cymbalta and Ativan at home doses  Fibromyalgia -Continue Lyrica -Continue home dose Percocet  Diabetes mellitus type 2 -NovoLog sliding scale -Hemoglobin A1c--7.6 -Not on any agents in the outpatient setting  Morbid Obesity BMI 41.99    Discharge Instructions   Allergies as of 04/03/2017      Reactions   Other    Band Aids    Tape    Lipitor [atorvastatin Calcium] Rash, Other (See Comments)   Muscle pain   Niaspan [niacin Er] Rash, Other (See Comments)   Muscle pain      Medication List    STOP taking these medications   diltiazem 300 MG 24 hr capsule Commonly known as:  TIAZAC   methocarbamol 500 MG tablet Commonly known as:  ROBAXIN     TAKE these medications   ACIDOPHILUS/PECTIN PO Take 1 capsule by mouth daily.   budesonide-formoterol 80-4.5 MCG/ACT inhaler Commonly known as:  SYMBICORT Inhale 2 puffs into the lungs 2 (two) times daily.   CARTIA XT 300 MG 24 hr capsule Generic drug:  diltiazem TAKE ONE CAPSULE BY MOUTH EVERY DAY   donepezil 5 MG tablet Commonly known as:  ARICEPT Take 5 mg by mouth at bedtime.   DULoxetine 60 MG capsule Commonly known as:  CYMBALTA Take 60 mg by mouth daily.   ELIQUIS 2.5 MG Tabs tablet Generic drug:  apixaban Take 2.5 mg by mouth 2 (two) times daily.   furosemide 20 MG tablet  Commonly known as:  LASIX Take 20 mg by mouth daily.   ipratropium-albuterol  0.5-2.5 (3) MG/3ML Soln Commonly known as:  DUONEB Take 3 mLs by nebulization every 6 (six) hours.   LORazepam 0.5 MG tablet Commonly known as:  ATIVAN Take 0.25 mg by mouth 2 (two) times daily.   LYRICA 100 MG capsule Generic drug:  pregabalin Take 100 mg by mouth 3 (three) times daily.   methotrexate 2.5 MG tablet Commonly known as:  RHEUMATREX Take 12.5 mg by mouth once a week. *Taken on Thursdays at 1200Caution:Chemotherapy. Protect from light.   multivitamin with minerals tablet Take 1 tablet by mouth daily.   oxyCODONE-acetaminophen 7.5-325 MG tablet Commonly known as:  PERCOCET Take one tablet by mouth every 4 hours as needed for pain. Max APAP 3gm/24hrs   pantoprazole 40 MG tablet Commonly known as:  PROTONIX Take 40 mg by mouth 2 (two) times daily.   phenylephrine-shark liver oil-mineral oil-petrolatum 0.25-3-14-71.9 % rectal ointment Commonly known as:  PREPARATION H Place 1 application rectally 2 (two) times daily as needed for hemorrhoids.   polyethylene glycol packet Commonly known as:  MIRALAX / GLYCOLAX Take 17 g by mouth daily as needed for mild constipation or moderate constipation.   potassium chloride 10 MEQ tablet Commonly known as:  K-DUR,KLOR-CON Take 10 mEq by mouth daily. Take 1 tablet by mouth once a day - take with Lasix   predniSONE 10 MG tablet Commonly known as:  DELTASONE Take 6 tablets (60 mg total) by mouth daily with breakfast. And decrease by one tablet daily Start taking on:  04/04/2017 What changed:    medication strength  how much to take  when to take this  additional instructions  Another medication with the same name was removed. Continue taking this medication, and follow the directions you see here.   PROAIR HFA 108 (90 Base) MCG/ACT inhaler Generic drug:  albuterol Inhale 2 puffs into the lungs every 6 (six) hours as needed for wheezing or shortness of breath. For rescue   raloxifene 60 MG tablet Commonly known as:   EVISTA Take 60 mg by mouth daily.   rosuvastatin 20 MG tablet Commonly known as:  CRESTOR Take 20 mg by mouth daily.   TRELEGY ELLIPTA 100-62.5-25 MCG/INH Aepb Generic drug:  Fluticasone-Umeclidin-Vilant Inhale 1 puff into the lungs daily.   Vitamin D3 1000 units Caps Take 1 capsule by mouth daily.       Allergies  Allergen Reactions  . Other     Band Aids   . Tape   . Lipitor [Atorvastatin Calcium] Rash and Other (See Comments)    Muscle pain  . Niaspan [Niacin Er] Rash and Other (See Comments)    Muscle pain    Consultations:  none   Procedures/Studies: Dg Chest 2 View  Result Date: 03/31/2017 CLINICAL DATA:  Cough. EXAM: CHEST  2 VIEW COMPARISON:  07/26/2016 and prior radiographs FINDINGS: Cardiomegaly again noted. Bibasilar opacities are present-question atelectasis versus pneumonia. No pleural effusion or pneumothorax. No acute bony abnormalities are noted. IMPRESSION: Bibasilar opacities-question atelectasis versus pneumonia. Cardiomegaly. Electronically Signed   By: Margarette Canada M.D.   On: 03/31/2017 19:34        Discharge Exam: Vitals:   04/03/17 1100 04/03/17 1101  BP:    Pulse: 76   Resp:    Temp:    SpO2: 93% 95%   Vitals:   04/03/17 1058 04/03/17 1059 04/03/17 1100 04/03/17 1101  BP:      Pulse: 86  78 76   Resp:      Temp:      TempSrc:      SpO2: (!) 87% 90% 93% 95%  Weight:      Height:        General: Pt is alert, awake, not in acute distress Cardiovascular: RRR, S1/S2 +, no rubs, no gallops Respiratory: CTA bilaterally, no wheezing, no rhonchi Abdominal: Soft, NT, ND, bowel sounds + Extremities: no edema, no cyanosis   The results of significant diagnostics from this hospitalization (including imaging, microbiology, ancillary and laboratory) are listed below for reference.    Significant Diagnostic Studies: Dg Chest 2 View  Result Date: 03/31/2017 CLINICAL DATA:  Cough. EXAM: CHEST  2 VIEW COMPARISON:  07/26/2016 and prior  radiographs FINDINGS: Cardiomegaly again noted. Bibasilar opacities are present-question atelectasis versus pneumonia. No pleural effusion or pneumothorax. No acute bony abnormalities are noted. IMPRESSION: Bibasilar opacities-question atelectasis versus pneumonia. Cardiomegaly. Electronically Signed   By: Margarette Canada M.D.   On: 03/31/2017 19:34     Microbiology: Recent Results (from the past 240 hour(s))  Respiratory Panel by PCR     Status: None   Collection Time: 04/01/17 11:20 AM  Result Value Ref Range Status   Adenovirus NOT DETECTED NOT DETECTED Final   Coronavirus 229E NOT DETECTED NOT DETECTED Final   Coronavirus HKU1 NOT DETECTED NOT DETECTED Final   Coronavirus NL63 NOT DETECTED NOT DETECTED Final   Coronavirus OC43 NOT DETECTED NOT DETECTED Final   Metapneumovirus NOT DETECTED NOT DETECTED Final   Rhinovirus / Enterovirus NOT DETECTED NOT DETECTED Final   Influenza A NOT DETECTED NOT DETECTED Final   Influenza B NOT DETECTED NOT DETECTED Final   Parainfluenza Virus 1 NOT DETECTED NOT DETECTED Final   Parainfluenza Virus 2 NOT DETECTED NOT DETECTED Final   Parainfluenza Virus 3 NOT DETECTED NOT DETECTED Final   Parainfluenza Virus 4 NOT DETECTED NOT DETECTED Final   Respiratory Syncytial Virus NOT DETECTED NOT DETECTED Final   Bordetella pertussis NOT DETECTED NOT DETECTED Final   Chlamydophila pneumoniae NOT DETECTED NOT DETECTED Final   Mycoplasma pneumoniae NOT DETECTED NOT DETECTED Final    Comment: Performed at [facility], [address], [place], [place] [address]     Labs: Basic Metabolic Panel: Recent Labs  Lab 03/31/17 1852 04/01/17 0633 04/02/17 0627  NA 142 137 140  K 3.5 3.9 4.1  CL 102 99* 102  CO2 29 27 28   GLUCOSE 165* 342* 241*  BUN 17 19 27*  CREATININE 1.29* 1.28* 1.35*  CALCIUM 9.7 9.7 10.4*  MG 2.0  --  2.1  PHOS 3.0  --   --    Liver Function Tests: Recent Labs  Lab 03/31/17 1852  AST 30  ALT 17  ALKPHOS 70  BILITOT 0.7   PROT 6.9  ALBUMIN 3.2*   No results for input(s): LIPASE, AMYLASE in the last 168 hours. No results for input(s): AMMONIA in the last 168 hours. CBC: Recent Labs  Lab 03/31/17 1852 04/01/17 0633  WBC 9.2 6.3  NEUTROABS 6.4  --   HGB 13.4 13.2  HCT 44.7 43.6  MCV 89.4 89.5  PLT 206 174   Cardiac Enzymes: Recent Labs  Lab 03/31/17 1852  TROPONINI <0.03   BNP: Invalid input(s): POCBNP CBG: Recent Labs  Lab 04/02/17 1126 04/02/17 1630 04/02/17 2013 04/03/17 0733 04/03/17 1147  GLUCAP 198* 186* 198* 234* 210*    Time coordinating discharge:  Greater than 30 minutes  Signed:  Orson Eva, DO [facility] Pager: [phone] 04/03/2017, 2:26 PM

## 2017-04-03 DIAGNOSIS — Z6841 Body Mass Index (BMI) 40.0 and over, adult: Secondary | ICD-10-CM | POA: Diagnosis not present

## 2017-04-03 DIAGNOSIS — G608 Other hereditary and idiopathic neuropathies: Secondary | ICD-10-CM | POA: Diagnosis present

## 2017-04-03 DIAGNOSIS — I129 Hypertensive chronic kidney disease with stage 1 through stage 4 chronic kidney disease, or unspecified chronic kidney disease: Secondary | ICD-10-CM | POA: Diagnosis present

## 2017-04-03 DIAGNOSIS — K219 Gastro-esophageal reflux disease without esophagitis: Secondary | ICD-10-CM | POA: Diagnosis present

## 2017-04-03 DIAGNOSIS — F0390 Unspecified dementia without behavioral disturbance: Secondary | ICD-10-CM | POA: Diagnosis not present

## 2017-04-03 DIAGNOSIS — I4891 Unspecified atrial fibrillation: Secondary | ICD-10-CM | POA: Diagnosis not present

## 2017-04-03 DIAGNOSIS — E1165 Type 2 diabetes mellitus with hyperglycemia: Secondary | ICD-10-CM | POA: Diagnosis present

## 2017-04-03 DIAGNOSIS — I4581 Long QT syndrome: Secondary | ICD-10-CM | POA: Diagnosis present

## 2017-04-03 DIAGNOSIS — E1122 Type 2 diabetes mellitus with diabetic chronic kidney disease: Secondary | ICD-10-CM | POA: Diagnosis present

## 2017-04-03 DIAGNOSIS — K760 Fatty (change of) liver, not elsewhere classified: Secondary | ICD-10-CM | POA: Diagnosis present

## 2017-04-03 DIAGNOSIS — J441 Chronic obstructive pulmonary disease with (acute) exacerbation: Secondary | ICD-10-CM | POA: Diagnosis not present

## 2017-04-03 DIAGNOSIS — F329 Major depressive disorder, single episode, unspecified: Secondary | ICD-10-CM | POA: Diagnosis present

## 2017-04-03 DIAGNOSIS — M810 Age-related osteoporosis without current pathological fracture: Secondary | ICD-10-CM | POA: Diagnosis present

## 2017-04-03 DIAGNOSIS — M797 Fibromyalgia: Secondary | ICD-10-CM | POA: Diagnosis present

## 2017-04-03 DIAGNOSIS — F411 Generalized anxiety disorder: Secondary | ICD-10-CM | POA: Diagnosis present

## 2017-04-03 DIAGNOSIS — Z90710 Acquired absence of both cervix and uterus: Secondary | ICD-10-CM | POA: Diagnosis not present

## 2017-04-03 DIAGNOSIS — G4700 Insomnia, unspecified: Secondary | ICD-10-CM | POA: Diagnosis present

## 2017-04-03 DIAGNOSIS — E7800 Pure hypercholesterolemia, unspecified: Secondary | ICD-10-CM | POA: Diagnosis present

## 2017-04-03 DIAGNOSIS — J9621 Acute and chronic respiratory failure with hypoxia: Secondary | ICD-10-CM | POA: Diagnosis present

## 2017-04-03 DIAGNOSIS — I10 Essential (primary) hypertension: Secondary | ICD-10-CM | POA: Diagnosis not present

## 2017-04-03 DIAGNOSIS — Z903 Acquired absence of stomach [part of]: Secondary | ICD-10-CM | POA: Diagnosis not present

## 2017-04-03 DIAGNOSIS — N183 Chronic kidney disease, stage 3 (moderate): Secondary | ICD-10-CM | POA: Diagnosis not present

## 2017-04-03 DIAGNOSIS — Z7901 Long term (current) use of anticoagulants: Secondary | ICD-10-CM | POA: Diagnosis not present

## 2017-04-03 LAB — GLUCOSE, CAPILLARY
GLUCOSE-CAPILLARY: 209 mg/dL — AB (ref 65–99)
GLUCOSE-CAPILLARY: 210 mg/dL — AB (ref 65–99)
Glucose-Capillary: 234 mg/dL — ABNORMAL HIGH (ref 65–99)

## 2017-04-03 MED ORDER — PREDNISONE 10 MG PO TABS: 60.0000 mg | ORAL_TABLET | Freq: Every day | ORAL | 0 refills | Status: DC

## 2017-04-03 MED ORDER — OXYCODONE-ACETAMINOPHEN 7.5-325 MG PO TABS: ORAL_TABLET | ORAL | 0 refills | Status: DC

## 2017-04-03 NOTE — Clinical Social Work Note (Signed)
Clinical Social Work Assessment  Patient Details  Name: [name] MRN: [id number] Date of Birth: 08/02/1928  Date of referral:  04/03/17               Reason for consult:  Discharge Planning                Permission sought to share information with:    Permission granted to share information::     Name::        Agency::     Relationship::     Contact Information:     Housing/Transportation Living arrangements for the past 2 months:  [place] of Information:  Adult Children Patient Interpreter Needed:  None Criminal Activity/Legal Involvement Pertinent to Current Situation/Hospitalization:  No - Comment as needed Significant Relationships:  Adult Children, Other Family Members Lives with:  Facility Resident Do you feel safe going back to the place where you live?  Yes Need for family participation in patient care:  Yes (Comment)  Care giving concerns: No concerns at baseline.   Social Worker assessment / plan: Pt is an 84 year old female admitted from [facility]. Spoke with pt's daughter today to complete assessment. Pt has been at [facility] for about six months and the plan is for return to [facility] at [facility] [facility]. Pt does not ambulate any distance. She can transfer and take a few steps. She uses a wheelchair for distance. Pt requires assistance with bathing and dressing. Pt uses O2 at the facility. She has a Conservation officer, nature from [place] per daughter. Family will need to bring a portable tank for dc if pt needs continuous oxygen. Per Butch Penny at [facility], pt has only been using oxygen at the facility at night. Updated RN CM of this information. CSW will contact pt's daughter at her request once dc orders have been completed.   Employment status:  Retired Forensic scientist:  Information systems manager, Medicaid In [place] PT Recommendations:  Not assessed at this time Information / Referral to community resources:     Patient/Family's Response to care: Pt and family are  accepting of care.  Patient/Family's Understanding of and Emotional Response to Diagnosis, Current Treatment, and Prognosis: Pt and family appear to have a good understanding of pt's diagnosis and treatment recommendations. No emotional distress noted.  Emotional Assessment Appearance:  Appears stated age Attitude/Demeanor/Rapport:    Affect (typically observed):  Calm Orientation:  Oriented to Self, Oriented to Place, Oriented to Situation Alcohol / Substance use:  Not Applicable Psych involvement (Current and /or in the community):  No (Comment)  Discharge Needs  Concerns to be addressed:  Discharge Planning Concerns Readmission within the last 30 days:  No Current discharge risk:  None Barriers to Discharge:  No Barriers Identified   Shade Flood, LCSW 04/03/2017, 12:32 PM

## 2017-04-03 NOTE — Progress Notes (Signed)
Patient's IV removed, patient tolerated well, AVS reviewed with patient's daughter who verbalized understanding. Patient discharged to [facility] and transported by patient's daughter.

## 2017-04-03 NOTE — Care Management Important Message (Signed)
Important Message  Patient Details  Name: MYKAILA BLUNCK MRN: [id number] Date of Birth: 08-15-1928   Medicare Important Message Given:  Yes    Karem Tomaso, Chauncey Reading, RN 04/03/2017, 12:29 PM

## 2017-04-03 NOTE — Evaluation (Addendum)
Physical Therapy Evaluation Patient Details Name: [name] MRN: [id number] DOB: 12-07-1928 Today's Date: 04/03/2017   History of Present Illness  [name] is a 84 y.o. female with medical history significant of anemia, atrial fibrillation, back pain, bacterial pneumonia, skin cancer, COPD, chronic respiratory failure, dementia, depression, diverticulosis, fatty liver disease, fibromyalgia, gastric polyp, generalized anxiety disorder, GERD,.  [place] peripheral neuropathy, hyperlipidemia, hypertension, internal hemorrhoids, osteoporosis, rheumatoid arthritis, PUD, vitamin D deficiency who is coming to the emergency department from her assisted living facility due to progressively worse dyspnea associated with productive cough for several days.  No reports of fever as far as we know.    Clinical Impression  Patient functioning near baseline for functional and wheelchair mobility, demonstrates slow labored movement during transfers, limited to a few steps due fair/poor standing balance, generalized weakness and fatigue.  Patient able to navigate wheelchair from room into hallway and make turns with VC's for proper hand placement with fair/good carryover demonstrated.  Patient on 2 LPM during transfers, wheelchair mobility with O2 sats at 95-96%.  Patient will benefit from continued physical therapy in hospital and recommended venue below to increase strength, balance, endurance for safe ADLs and gait.    Follow Up Recommendations Supervision/Assistance - 24 hour;Supervision for mobility/OOB    Equipment Recommendations  None recommended by PT    Recommendations for Other Services       Precautions / Restrictions Precautions Precautions: Fall Restrictions Weight Bearing Restrictions: No      Mobility  Bed Mobility Overal bed mobility: Needs Assistance Bed Mobility: Supine to Sit     Supine to sit: Min guard        Transfers Overall transfer level: Needs  assistance Equipment used: Rolling walker (2 wheeled) Transfers: Sit to/from [facility] Sit to Stand: Min assist Stand pivot transfers: Min assist       General transfer comment: demonstrates slow labored movement  Ambulation/Gait Ambulation/Gait assistance: Mod assist Ambulation Distance (Feet): 3 Feet Assistive device: Rolling walker (2 wheeled) Gait Pattern/deviations: Decreased step length - right;Decreased step length - left;Decreased stride length   Gait velocity interpretation: Below normal speed for age/gender General Gait Details: limited to a few steps using RW or leaning on side rest of wheelchair and chair, unsteady and requires much time  Holiday representative Wheelchair mobility: Yes Wheelchair propulsion: Both upper extremities;Both lower extermities Wheelchair parts: Needs assistance Distance: 20 feet Wheelchair Assistance Details (indicate cue type and reason): requires verbal cues for proper hand placement to propel self, demonstrates good return for using BUE/LE to propel self with Min verbal cueing  Modified Rankin (Stroke Patients Only)       Balance Overall balance assessment: Needs assistance Sitting-balance support: No upper extremity supported;Feet supported Sitting balance-Leahy Scale: Good     Standing balance support: Bilateral upper extremity supported;During functional activity Standing balance-Leahy Scale: Poor Standing balance comment: fair/poor using RW                             Pertinent Vitals/Pain Pain Assessment: No/denies pain    Home Living Family/patient expects to be discharged to:: Assisted living               Home Equipment: Bedside commode;Shower seat;Hospital bed;Walker - 4 wheels;Other (comment);Wheelchair - manual      Prior Function Level of Independence: Needs assistance   Gait / Transfers  Assistance Needed: limited to a few steps for  stand pivot transfers with facility staff assisting  ADL's / Homemaking Assistance Needed: assisted by facility staff        Hand Dominance        Extremity/Trunk Assessment   Upper Extremity Assessment Upper Extremity Assessment: Generalized weakness    Lower Extremity Assessment Lower Extremity Assessment: Generalized weakness    Cervical / Trunk Assessment Cervical / Trunk Assessment: Normal  Communication   Communication: HOH  Cognition Arousal/Alertness: Awake/alert Behavior During Therapy: WFL for tasks assessed/performed Overall Cognitive Status: Within Functional Limits for tasks assessed                                        General Comments      Exercises     Assessment/Plan    PT Assessment Patient needs continued PT services  PT Problem List Decreased strength;Decreased activity tolerance;Decreased balance;Decreased mobility       PT Treatment Interventions Functional mobility training;Therapeutic activities;Therapeutic exercise;Patient/family education;Wheelchair mobility training;Gait training    PT Goals (Current goals can be found in the Care Plan section)  Acute Rehab PT Goals Patient Stated Goal: return to ALF PT Goal Formulation: With patient Time For Goal Achievement: 04/07/17 Potential to Achieve Goals: Good    Frequency Min 3X/week   Barriers to discharge        Co-evaluation               AM-PAC PT "6 Clicks" Daily Activity  Outcome Measure Difficulty turning over in bed (including adjusting bedclothes, sheets and blankets)?: None Difficulty moving from lying on back to sitting on the side of the bed? : A Little Difficulty sitting down on and standing up from a chair with arms (e.g., wheelchair, bedside commode, etc,.)?: A Little Help needed moving to and from a bed to chair (including a wheelchair)?: A Lot Help needed walking in hospital room?: A Lot Help needed climbing 3-5 steps with a railing? :  Total 6 Click Score: 15    End of Session   Activity Tolerance: Patient tolerated treatment well;Patient limited by fatigue Patient left: in chair;with call bell/phone within reach Nurse Communication: Mobility status PT Visit Diagnosis: Unsteadiness on feet (R26.81);Other abnormalities of gait and mobility (R26.89);Muscle weakness (generalized) (M62.81)    Time: 3016-0109 PT Time Calculation (min) (ACUTE ONLY): 40 min   Charges:   PT Evaluation $PT Eval Moderate Complexity: 1 Mod PT Treatments $Therapeutic Activity: 8-22 mins $Wheel Chair Management: 8-22 mins   PT G Codes:        2:36 PM, 04-10-17 Lonell Grandchild, MPT Physical Therapist with [facility] [phone] office [phone] mobile phone

## 2017-04-03 NOTE — Care Management (Addendum)
Patient from [facility]. Apparently using concentrator for night time oxygen but only has a few tanks. May need O2 continuously.  CM left voicemail with Ahsly of [facility] to f/u.   ADDENDUM: Spoke with Ashly- [facility] rep. Patient is still active with [facility] and is being billed for continuous oxygen.  CSW notified and will discuss with [facility] who is interested in switching companies.  Patient's daughter to bring port O2 tank for transfer back to [facility].  Patient does have concentrator at facility.   Discussed with PT, no recommendations from PT as patient is at her baseline with functional mobility/WC.

## 2017-04-03 NOTE — Plan of Care (Signed)
  Acute Rehab PT Goals(only PT should resolve) Pt Will Go Supine/Side To Sit 04/03/2017 1437 - Progressing by Lonell Grandchild, PT Flowsheets Taken 04/03/2017 1437  Pt will go Supine/Side to Sit with supervision Patient Will Transfer Sit To/From Stand 04/03/2017 1437 - Progressing by Lonell Grandchild, PT Flowsheets Taken 04/03/2017 1437  Patient will transfer sit to/from stand with min guard assist Pt Will Transfer Bed To Chair/Chair To Bed 04/03/2017 1437 - Progressing by Lonell Grandchild, PT Flowsheets Taken 04/03/2017 1437  Pt will Transfer Bed to Chair/Chair to Bed min guard assist Pt Will Ambulate 04/03/2017 1437 - Progressing by Lonell Grandchild, PT Flowsheets Taken 04/03/2017 1437  Pt will Ambulate 10 feet;with minimal assist;with rolling walker  2:38 PM, 04/03/17 Lonell Grandchild, MPT Physical Therapist with [facility] [phone] office [phone] mobile phone

## 2017-04-03 NOTE — NC FL2 (Signed)
[place] MEDICAID FL2 LEVEL OF CARE SCREENING TOOL     IDENTIFICATION  Patient Name: [name] Birthdate: 07-22-1928 Sex: female Admission Date (Current Location): 03/31/2017  [place] and [place] Number:  [name] [id number] [place] and Address:  [place] [address], [employer]      Provider Number: [id number]  Attending Physician Name and Address:  Orson Eva, MD  Relative Name and Phone Number:  Catarina Hartshorn  [phone]    Current Level of Care: Hospital Recommended Level of Care: [place] Prior Approval Number:    Date Approved/Denied:   PASRR Number:    Discharge Plan: Other (Comment)(ALF)    Current Diagnoses: Patient Active Problem List   Diagnosis Date Noted  . Acute on chronic respiratory failure with hypoxia ([place]) 04/01/2017  . CKD (chronic kidney disease), stage III ([place]) 04/01/2017  . COPD with acute exacerbation ([place])   . COPD exacerbation ([place]) 03/31/2017  . Depression 03/31/2017  . Dementia 03/31/2017  . Peripheral neuropathy 08/01/2016  . AKI (acute kidney injury) ([place]) 07/26/2016  . Atrial fibrillation ([place]) 07/26/2016  . Hypertension 07/26/2016  . Acute gastroenteritis 07/26/2016  . Altered mental status 07/26/2016  . Prolonged QT interval 07/26/2016  . Syncope 09/12/2014  . CKD (chronic kidney disease) 09/12/2014  . Hyperglycemia 09/12/2014  . Hypoxia 09/12/2014  . Acute encephalopathy 09/12/2014  . Chest pain 07/21/2013  . Chronic respiratory failure ([place]) 07/21/2013  . Hypokalemia 07/21/2013  . Atrial fibrillation with RVR ([place]) 07/21/2013  . Knee pain 05/21/2013  . Fibromyalgia   . COPD (chronic obstructive pulmonary disease) ([place])   . 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 GERD (gastroesophageal reflux disease)   . Rheumatoid arteritis   . Back pain   . Fracture of distal fibula 09/20/2010    Orientation RESPIRATION BLADDER Height & Weight     Self, Situation,  Place  O2(2L) Continent Weight: 216 lb 7.9 oz (98.2 kg) Height:  5' (152.4 cm)  BEHAVIORAL SYMPTOMS/MOOD NEUROLOGICAL BOWEL NUTRITION STATUS      Continent (heart healthy (per facility this equates to their regular diet)/Carb modified (per facility this equates to their no concentrated sweets diet))  AMBULATORY STATUS COMMUNICATION OF NEEDS Skin   Extensive Assist Verbally Normal                       Personal Care Assistance Level of Assistance  Bathing, Feeding, Dressing Bathing Assistance: Limited assistance Feeding assistance: Independent Dressing Assistance: Limited assistance     Functional Limitations Info  Sight, Hearing, Speech   Hearing Info: Impaired Speech Info: Adequate    SPECIAL CARE FACTORS FREQUENCY                       Contractures Contractures Info: Not present    Additional Factors Info  Psychotropic Code Status Info: full Allergies Info: tape, lipitor, Niaspan Psychotropic Info: Cymbalta, Ativan         Current Medications (04/03/2017):  This is the current hospital active medication list Current Facility-Administered Medications  Medication Dose Route Frequency Provider Last Rate Last Dose  . acetaminophen (TYLENOL) tablet 650 mg  650 mg Oral Q6H PRN Orson Eva, MD   650 mg at 04/03/17 0037  . albuterol (PROVENTIL) (2.5 MG/3ML) 0.083% nebulizer solution 2.5 mg  2.5 mg Nebulization Q4H PRN Reubin Milan, MD      . apixaban Arne [place]) tablet 2.5 mg  2.5 mg Oral BID Reubin Milan, MD  2.5 mg at 04/03/17 1109  . budesonide (PULMICORT) nebulizer solution 0.5 mg  0.5 mg Nebulization BID Ashley Bultema, MD   0.5 mg at 04/03/17 0735  . diltiazem (CARDIZEM CD) 24 hr capsule 300 mg  300 mg Oral Daily Reubin Milan, MD   300 mg at 04/03/17 1109  . DULoxetine (CYMBALTA) DR capsule 60 mg  60 mg Oral Daily Reubin Milan, MD   60 mg at 04/03/17 1108  . furosemide (LASIX) tablet 20 mg  20 mg Oral Daily Reubin Milan, MD   20 mg  at 04/03/17 1108  . insulin aspart (novoLOG) injection 0-20 Units  0-20 Units Subcutaneous TID WC Orson Eva, MD   7 Units at 04/03/17 1238  . insulin aspart (novoLOG) injection 0-5 Units  0-5 Units Subcutaneous QHS Derl Abalos, MD      . ipratropium-albuterol (DUONEB) 0.5-2.5 (3) MG/3ML nebulizer solution 3 mL  3 mL Nebulization TID Orson Eva, MD   3 mL at 04/03/17 0731  . LORazepam (ATIVAN) tablet 0.25 mg  0.25 mg Oral BID Reubin Milan, MD   0.25 mg at 04/03/17 1109  . MEDLINE mouth rinse  15 mL Mouth Rinse BID Reubin Milan, MD   15 mL at 04/02/17 1123  . oxyCODONE-acetaminophen (PERCOCET) 7.5-325 MG per tablet 1 tablet  1 tablet Oral Q4H PRN Reubin Milan, MD      . pantoprazole (PROTONIX) EC tablet 40 mg  40 mg Oral BID Reubin Milan, MD   40 mg at 04/03/17 1108  . predniSONE (DELTASONE) tablet 60 mg  60 mg Oral Q breakfast Meilin Brosh, Shanon Brow, MD   60 mg at 04/03/17 0841  . pregabalin (LYRICA) capsule 100 mg  100 mg Oral TID Reubin Milan, MD   100 mg at 04/03/17 1108     Discharge Medications: TAKE these medications   ACIDOPHILUS/PECTIN PO Take 1 capsule by mouth daily.   budesonide-formoterol 80-4.5 MCG/ACT inhaler Commonly known as:  SYMBICORT Inhale 2 puffs into the lungs 2 (two) times daily.   CARTIA XT 300 MG 24 hr capsule Generic drug:  diltiazem TAKE ONE CAPSULE BY MOUTH EVERY DAY   donepezil 5 MG tablet Commonly known as:  ARICEPT Take 5 mg by mouth at bedtime.   DULoxetine 60 MG capsule Commonly known as:  CYMBALTA Take 60 mg by mouth daily.   ELIQUIS 2.5 MG Tabs tablet Generic drug:  apixaban Take 2.5 mg by mouth 2 (two) times daily.   furosemide 20 MG tablet Commonly known as:  LASIX Take 20 mg by mouth daily.   ipratropium-albuterol 0.5-2.5 (3) MG/3ML Soln Commonly known as:  DUONEB Take 3 mLs by nebulization every 6 (six) hours.   LORazepam 0.5 MG tablet Commonly known as:  ATIVAN Take 0.25 mg by mouth 2 (two) times  daily.   LYRICA 100 MG capsule Generic drug:  pregabalin Take 100 mg by mouth 3 (three) times daily.   methotrexate 2.5 MG tablet Commonly known as:  RHEUMATREX Take 12.5 mg by mouth once a week. *Taken on Thursdays at 1200Caution:Chemotherapy. Protect from light.   multivitamin with minerals tablet Take 1 tablet by mouth daily.   oxyCODONE-acetaminophen 7.5-325 MG tablet Commonly known as:  PERCOCET Take one tablet by mouth every 4 hours as needed for pain. Max APAP 3gm/24hrs   pantoprazole 40 MG tablet Commonly known as:  PROTONIX Take 40 mg by mouth 2 (two) times daily.   phenylephrine-shark liver oil-mineral oil-petrolatum 0.25-3-14-71.9 % rectal ointment Commonly  known as:  PREPARATION H Place 1 application rectally 2 (two) times daily as needed for hemorrhoids.   polyethylene glycol packet Commonly known as:  MIRALAX / GLYCOLAX Take 17 g by mouth daily as needed for mild constipation or moderate constipation.   potassium chloride 10 MEQ tablet Commonly known as:  K-DUR,KLOR-CON Take 10 mEq by mouth daily. Take 1 tablet by mouth once a day - take with Lasix   predniSONE 10 MG tablet Commonly known as:  DELTASONE Take 6 tablets (60 mg total) by mouth daily with breakfast. And decrease by one tablet daily Start taking on:  04/04/2017 What changed:    medication strength  how much to take  when to take this  additional instructions  Another medication with the same name was removed. Continue taking this medication, and follow the directions you see here.   PROAIR HFA 108 (90 Base) MCG/ACT inhaler Generic drug:  albuterol Inhale 2 puffs into the lungs every 6 (six) hours as needed for wheezing or shortness of breath. For rescue   raloxifene 60 MG tablet Commonly known as:  EVISTA Take 60 mg by mouth daily.   rosuvastatin 20 MG tablet Commonly known as:  CRESTOR Take 20 mg by mouth daily.   TRELEGY ELLIPTA 100-62.5-25 MCG/INH Aepb Generic  drug:  Fluticasone-Umeclidin-Vilant Inhale 1 puff into the lungs daily.   Vitamin D3 1000 units Caps Take 1 capsule by mouth daily.       Relevant Imaging Results:  Relevant Lab Results:   Additional Information SSN [id number], Clydene Pugh, LCSW

## 2017-04-05 DIAGNOSIS — J9611 Chronic respiratory failure with hypoxia: Secondary | ICD-10-CM | POA: Diagnosis not present

## 2017-04-05 DIAGNOSIS — F0390 Unspecified dementia without behavioral disturbance: Secondary | ICD-10-CM | POA: Diagnosis not present

## 2017-04-05 DIAGNOSIS — J449 Chronic obstructive pulmonary disease, unspecified: Secondary | ICD-10-CM | POA: Diagnosis not present

## 2017-04-05 DIAGNOSIS — N183 Chronic kidney disease, stage 3 (moderate): Secondary | ICD-10-CM | POA: Diagnosis not present

## 2017-04-05 DIAGNOSIS — I482 Chronic atrial fibrillation: Secondary | ICD-10-CM | POA: Diagnosis not present

## 2017-04-05 DIAGNOSIS — I129 Hypertensive chronic kidney disease with stage 1 through stage 4 chronic kidney disease, or unspecified chronic kidney disease: Secondary | ICD-10-CM | POA: Diagnosis not present

## 2017-04-06 DIAGNOSIS — R945 Abnormal results of liver function studies: Secondary | ICD-10-CM | POA: Diagnosis not present

## 2017-04-06 DIAGNOSIS — J9611 Chronic respiratory failure with hypoxia: Secondary | ICD-10-CM | POA: Diagnosis not present

## 2017-04-06 DIAGNOSIS — G3184 Mild cognitive impairment, so stated: Secondary | ICD-10-CM | POA: Diagnosis not present

## 2017-04-06 DIAGNOSIS — J449 Chronic obstructive pulmonary disease, unspecified: Secondary | ICD-10-CM | POA: Diagnosis not present

## 2017-04-06 DIAGNOSIS — J9621 Acute and chronic respiratory failure with hypoxia: Secondary | ICD-10-CM | POA: Diagnosis not present

## 2017-04-06 DIAGNOSIS — R600 Localized edema: Secondary | ICD-10-CM | POA: Diagnosis not present

## 2017-04-06 DIAGNOSIS — I129 Hypertensive chronic kidney disease with stage 1 through stage 4 chronic kidney disease, or unspecified chronic kidney disease: Secondary | ICD-10-CM | POA: Diagnosis not present

## 2017-04-06 DIAGNOSIS — F330 Major depressive disorder, recurrent, mild: Secondary | ICD-10-CM | POA: Diagnosis not present

## 2017-04-06 DIAGNOSIS — M05769 Rheumatoid arthritis with rheumatoid factor of unspecified knee without organ or systems involvement: Secondary | ICD-10-CM | POA: Diagnosis not present

## 2017-04-06 DIAGNOSIS — F419 Anxiety disorder, unspecified: Secondary | ICD-10-CM | POA: Diagnosis not present

## 2017-04-06 DIAGNOSIS — I482 Chronic atrial fibrillation: Secondary | ICD-10-CM | POA: Diagnosis not present

## 2017-04-06 DIAGNOSIS — F0390 Unspecified dementia without behavioral disturbance: Secondary | ICD-10-CM | POA: Diagnosis not present

## 2017-04-06 DIAGNOSIS — R944 Abnormal results of kidney function studies: Secondary | ICD-10-CM | POA: Diagnosis not present

## 2017-04-06 DIAGNOSIS — R0902 Hypoxemia: Secondary | ICD-10-CM | POA: Diagnosis not present

## 2017-04-06 DIAGNOSIS — I4891 Unspecified atrial fibrillation: Secondary | ICD-10-CM | POA: Diagnosis not present

## 2017-04-06 DIAGNOSIS — N183 Chronic kidney disease, stage 3 (moderate): Secondary | ICD-10-CM | POA: Diagnosis not present

## 2017-04-06 DIAGNOSIS — M06069 Rheumatoid arthritis without rheumatoid factor, unspecified knee: Secondary | ICD-10-CM | POA: Diagnosis not present

## 2017-04-07 DIAGNOSIS — J449 Chronic obstructive pulmonary disease, unspecified: Secondary | ICD-10-CM | POA: Diagnosis not present

## 2017-04-07 DIAGNOSIS — I482 Chronic atrial fibrillation: Secondary | ICD-10-CM | POA: Diagnosis not present

## 2017-04-07 DIAGNOSIS — J9611 Chronic respiratory failure with hypoxia: Secondary | ICD-10-CM | POA: Diagnosis not present

## 2017-04-07 DIAGNOSIS — F0390 Unspecified dementia without behavioral disturbance: Secondary | ICD-10-CM | POA: Diagnosis not present

## 2017-04-07 DIAGNOSIS — N183 Chronic kidney disease, stage 3 (moderate): Secondary | ICD-10-CM | POA: Diagnosis not present

## 2017-04-07 DIAGNOSIS — I129 Hypertensive chronic kidney disease with stage 1 through stage 4 chronic kidney disease, or unspecified chronic kidney disease: Secondary | ICD-10-CM | POA: Diagnosis not present

## 2017-04-10 ENCOUNTER — Encounter (HOSPITAL_COMMUNITY): Payer: Self-pay | Admitting: *Deleted

## 2017-04-10 ENCOUNTER — Other Ambulatory Visit: Payer: Self-pay

## 2017-04-10 ENCOUNTER — Inpatient Hospital Stay (HOSPITAL_COMMUNITY)
Admission: EM | Admit: 2017-04-10 | Discharge: 2017-04-13 | DRG: 193 | Disposition: A | Discharge status: Skilled Nursing Facility | Payer: Medicare Other | Attending: Family Medicine | Admitting: Family Medicine

## 2017-04-10 ENCOUNTER — Emergency Department (HOSPITAL_COMMUNITY): Payer: Medicare Other

## 2017-04-10 DIAGNOSIS — G9340 Encephalopathy, unspecified: Secondary | ICD-10-CM | POA: Diagnosis present

## 2017-04-10 DIAGNOSIS — E1122 Type 2 diabetes mellitus with diabetic chronic kidney disease: Secondary | ICD-10-CM | POA: Diagnosis present

## 2017-04-10 DIAGNOSIS — I129 Hypertensive chronic kidney disease with stage 1 through stage 4 chronic kidney disease, or unspecified chronic kidney disease: Secondary | ICD-10-CM | POA: Diagnosis present

## 2017-04-10 DIAGNOSIS — N183 Chronic kidney disease, stage 3 (moderate): Secondary | ICD-10-CM | POA: Diagnosis not present

## 2017-04-10 DIAGNOSIS — Z841 Family history of disorders of kidney and ureter: Secondary | ICD-10-CM | POA: Diagnosis not present

## 2017-04-10 DIAGNOSIS — E7800 Pure hypercholesterolemia, unspecified: Secondary | ICD-10-CM | POA: Diagnosis present

## 2017-04-10 DIAGNOSIS — J441 Chronic obstructive pulmonary disease with (acute) exacerbation: Secondary | ICD-10-CM | POA: Diagnosis present

## 2017-04-10 DIAGNOSIS — S32010A Wedge compression fracture of first lumbar vertebra, initial encounter for closed fracture: Secondary | ICD-10-CM | POA: Diagnosis not present

## 2017-04-10 DIAGNOSIS — Z9181 History of falling: Secondary | ICD-10-CM | POA: Diagnosis not present

## 2017-04-10 DIAGNOSIS — Z91048 Other nonmedicinal substance allergy status: Secondary | ICD-10-CM

## 2017-04-10 DIAGNOSIS — K219 Gastro-esophageal reflux disease without esophagitis: Secondary | ICD-10-CM | POA: Diagnosis present

## 2017-04-10 DIAGNOSIS — J449 Chronic obstructive pulmonary disease, unspecified: Secondary | ICD-10-CM | POA: Diagnosis not present

## 2017-04-10 DIAGNOSIS — R51 Headache: Secondary | ICD-10-CM | POA: Diagnosis not present

## 2017-04-10 DIAGNOSIS — F411 Generalized anxiety disorder: Secondary | ICD-10-CM | POA: Diagnosis present

## 2017-04-10 DIAGNOSIS — E876 Hypokalemia: Secondary | ICD-10-CM | POA: Diagnosis present

## 2017-04-10 DIAGNOSIS — Z85828 Personal history of other malignant neoplasm of skin: Secondary | ICD-10-CM

## 2017-04-10 DIAGNOSIS — J9621 Acute and chronic respiratory failure with hypoxia: Secondary | ICD-10-CM | POA: Diagnosis not present

## 2017-04-10 DIAGNOSIS — S32010D Wedge compression fracture of first lumbar vertebra, subsequent encounter for fracture with routine healing: Secondary | ICD-10-CM | POA: Diagnosis not present

## 2017-04-10 DIAGNOSIS — M79601 Pain in right arm: Secondary | ICD-10-CM | POA: Diagnosis not present

## 2017-04-10 DIAGNOSIS — S3993XA Unspecified injury of pelvis, initial encounter: Secondary | ICD-10-CM | POA: Diagnosis not present

## 2017-04-10 DIAGNOSIS — S32019A Unspecified fracture of first lumbar vertebra, initial encounter for closed fracture: Secondary | ICD-10-CM | POA: Diagnosis present

## 2017-04-10 DIAGNOSIS — S299XXA Unspecified injury of thorax, initial encounter: Secondary | ICD-10-CM | POA: Diagnosis not present

## 2017-04-10 DIAGNOSIS — Y95 Nosocomial condition: Secondary | ICD-10-CM | POA: Diagnosis present

## 2017-04-10 DIAGNOSIS — R278 Other lack of coordination: Secondary | ICD-10-CM | POA: Diagnosis not present

## 2017-04-10 DIAGNOSIS — M6281 Muscle weakness (generalized): Secondary | ICD-10-CM | POA: Diagnosis not present

## 2017-04-10 DIAGNOSIS — W050XXA Fall from non-moving wheelchair, initial encounter: Secondary | ICD-10-CM | POA: Diagnosis present

## 2017-04-10 DIAGNOSIS — K760 Fatty (change of) liver, not elsewhere classified: Secondary | ICD-10-CM | POA: Diagnosis present

## 2017-04-10 DIAGNOSIS — M797 Fibromyalgia: Secondary | ICD-10-CM | POA: Diagnosis present

## 2017-04-10 DIAGNOSIS — Z86010 Personal history of colonic polyps: Secondary | ICD-10-CM | POA: Diagnosis not present

## 2017-04-10 DIAGNOSIS — I482 Chronic atrial fibrillation: Secondary | ICD-10-CM | POA: Diagnosis present

## 2017-04-10 DIAGNOSIS — Z9981 Dependence on supplemental oxygen: Secondary | ICD-10-CM

## 2017-04-10 DIAGNOSIS — E118 Type 2 diabetes mellitus with unspecified complications: Secondary | ICD-10-CM | POA: Diagnosis not present

## 2017-04-10 DIAGNOSIS — Z8249 Family history of ischemic heart disease and other diseases of the circulatory system: Secondary | ICD-10-CM

## 2017-04-10 DIAGNOSIS — G9341 Metabolic encephalopathy: Secondary | ICD-10-CM | POA: Diagnosis present

## 2017-04-10 DIAGNOSIS — R41841 Cognitive communication deficit: Secondary | ICD-10-CM | POA: Diagnosis not present

## 2017-04-10 DIAGNOSIS — J9611 Chronic respiratory failure with hypoxia: Secondary | ICD-10-CM | POA: Diagnosis not present

## 2017-04-10 DIAGNOSIS — S199XXA Unspecified injury of neck, initial encounter: Secondary | ICD-10-CM | POA: Diagnosis not present

## 2017-04-10 DIAGNOSIS — J189 Pneumonia, unspecified organism: Principal | ICD-10-CM | POA: Diagnosis present

## 2017-04-10 DIAGNOSIS — Z90710 Acquired absence of both cervix and uterus: Secondary | ICD-10-CM

## 2017-04-10 DIAGNOSIS — S0990XA Unspecified injury of head, initial encounter: Secondary | ICD-10-CM | POA: Diagnosis not present

## 2017-04-10 DIAGNOSIS — Z8051 Family history of malignant neoplasm of kidney: Secondary | ICD-10-CM

## 2017-04-10 DIAGNOSIS — F0390 Unspecified dementia without behavioral disturbance: Secondary | ICD-10-CM | POA: Diagnosis present

## 2017-04-10 DIAGNOSIS — Z800 Family history of malignant neoplasm of digestive organs: Secondary | ICD-10-CM | POA: Diagnosis not present

## 2017-04-10 DIAGNOSIS — Z888 Allergy status to other drugs, medicaments and biological substances status: Secondary | ICD-10-CM

## 2017-04-10 DIAGNOSIS — Z7901 Long term (current) use of anticoagulants: Secondary | ICD-10-CM | POA: Diagnosis not present

## 2017-04-10 DIAGNOSIS — I10 Essential (primary) hypertension: Secondary | ICD-10-CM | POA: Diagnosis not present

## 2017-04-10 DIAGNOSIS — M545 Low back pain: Secondary | ICD-10-CM | POA: Diagnosis not present

## 2017-04-10 DIAGNOSIS — T148XXA Other injury of unspecified body region, initial encounter: Secondary | ICD-10-CM | POA: Diagnosis not present

## 2017-04-10 DIAGNOSIS — M810 Age-related osteoporosis without current pathological fracture: Secondary | ICD-10-CM | POA: Diagnosis present

## 2017-04-10 DIAGNOSIS — R262 Difficulty in walking, not elsewhere classified: Secondary | ICD-10-CM | POA: Diagnosis not present

## 2017-04-10 LAB — CBC WITH DIFFERENTIAL/PLATELET
BASOS PCT: 0 %
Basophils Absolute: 0 10*3/uL (ref 0.0–0.1)
EOS PCT: 1 %
Eosinophils Absolute: 0.2 10*3/uL (ref 0.0–0.7)
HEMATOCRIT: 41.2 % (ref 36.0–46.0)
Hemoglobin: 12.6 g/dL (ref 12.0–15.0)
LYMPHS ABS: 1.9 10*3/uL (ref 0.7–4.0)
Lymphocytes Relative: 11 %
MCH: 26.9 pg (ref 26.0–34.0)
MCHC: 30.6 g/dL (ref 30.0–36.0)
MCV: 87.8 fL (ref 78.0–100.0)
MONOS PCT: 7 %
Monocytes Absolute: 1.2 10*3/uL — ABNORMAL HIGH (ref 0.1–1.0)
Neutro Abs: 13.9 10*3/uL — ABNORMAL HIGH (ref 1.7–7.7)
Neutrophils Relative %: 81 %
Platelets: 138 10*3/uL — ABNORMAL LOW (ref 150–400)
RBC: 4.69 MIL/uL (ref 3.87–5.11)
RDW: 16.7 % — AB (ref 11.5–15.5)
WBC: 17.2 10*3/uL — AB (ref 4.0–10.5)

## 2017-04-10 LAB — MRSA PCR SCREENING: MRSA BY PCR: NEGATIVE

## 2017-04-10 LAB — BASIC METABOLIC PANEL
Anion gap: 12 (ref 5–15)
BUN: 23 mg/dL — AB (ref 6–20)
CHLORIDE: 95 mmol/L — AB (ref 101–111)
CO2: 27 mmol/L (ref 22–32)
CREATININE: 1.15 mg/dL — AB (ref 0.44–1.00)
Calcium: 9.3 mg/dL (ref 8.9–10.3)
GFR calc Af Amer: 48 mL/min — ABNORMAL LOW (ref >60)
GFR calc non Af Amer: 42 mL/min — ABNORMAL LOW (ref >60)
GLUCOSE: 171 mg/dL — AB (ref 65–99)
POTASSIUM: 3.1 mmol/L — AB (ref 3.5–5.1)
Sodium: 134 mmol/L — ABNORMAL LOW (ref 135–145)

## 2017-04-10 LAB — URINALYSIS, ROUTINE W REFLEX MICROSCOPIC
Bilirubin Urine: NEGATIVE
Glucose, UA: NEGATIVE mg/dL
Hgb urine dipstick: NEGATIVE
KETONES UR: NEGATIVE mg/dL
LEUKOCYTES UA: NEGATIVE
Nitrite: NEGATIVE
PROTEIN: 30 mg/dL — AB
Specific Gravity, Urine: 1.016 (ref 1.005–1.030)
pH: 7 (ref 5.0–8.0)

## 2017-04-10 LAB — INFLUENZA PANEL BY PCR (TYPE A & B)
INFLBPCR: NEGATIVE
Influenza A By PCR: NEGATIVE

## 2017-04-10 LAB — MAGNESIUM: Magnesium: 1.8 mg/dL (ref 1.7–2.4)

## 2017-04-10 MED ORDER — DEXTROSE 5 % IV SOLN
1.0000 g | Freq: Once | INTRAVENOUS | Status: AC
  Administered 2017-04-10: 1 g via INTRAVENOUS
  Filled 2017-04-10: qty 1

## 2017-04-10 MED ORDER — ONDANSETRON HCL 4 MG PO TABS: 4.0000 mg | ORAL_TABLET | Freq: Four times a day (QID) | ORAL | Status: DC | PRN

## 2017-04-10 MED ORDER — SENNOSIDES-DOCUSATE SODIUM 8.6-50 MG PO TABS: 1.0000 | ORAL_TABLET | Freq: Every evening | ORAL | Status: DC | PRN

## 2017-04-10 MED ORDER — ONDANSETRON HCL 4 MG/2ML IJ SOLN
4.0000 mg | Freq: Four times a day (QID) | INTRAMUSCULAR | Status: DC | PRN
  Administered 2017-04-11: 4 mg via INTRAVENOUS
  Filled 2017-04-10: qty 2

## 2017-04-10 MED ORDER — VANCOMYCIN HCL 10 G IV SOLR
1500.0000 mg | Freq: Once | INTRAVENOUS | Status: DC
  Filled 2017-04-10: qty 1500

## 2017-04-10 MED ORDER — MOMETASONE FURO-FORMOTEROL FUM 100-5 MCG/ACT IN AERO
2.0000 | INHALATION_SPRAY | Freq: Two times a day (BID) | RESPIRATORY_TRACT | Status: DC
Start: 2017-04-10 — End: 2017-04-13
  Administered 2017-04-10 – 2017-04-13 (×6): 2 via RESPIRATORY_TRACT
  Filled 2017-04-10: qty 8.8

## 2017-04-10 MED ORDER — POLYETHYLENE GLYCOL 3350 17 G PO PACK: 17.0000 g | PACK | Freq: Every day | ORAL | Status: DC | PRN

## 2017-04-10 MED ORDER — PANTOPRAZOLE SODIUM 40 MG PO TBEC
40.0000 mg | DELAYED_RELEASE_TABLET | Freq: Two times a day (BID) | ORAL | Status: DC
  Administered 2017-04-10 – 2017-04-13 (×7): 40 mg via ORAL
  Filled 2017-04-10 (×7): qty 1

## 2017-04-10 MED ORDER — DEXTROSE 5 % IV SOLN
1.0000 g | INTRAVENOUS | Status: DC
  Administered 2017-04-11 – 2017-04-12 (×2): 1 g via INTRAVENOUS
  Filled 2017-04-10 (×2): qty 1

## 2017-04-10 MED ORDER — SODIUM CHLORIDE 0.9 % IV SOLN: 1250.0000 mg | INTRAVENOUS | Status: DC

## 2017-04-10 MED ORDER — FLUTICASONE-UMECLIDIN-VILANT 100-62.5-25 MCG/INH IN AEPB: 1.0000 | INHALATION_SPRAY | Freq: Every day | RESPIRATORY_TRACT | Status: DC

## 2017-04-10 MED ORDER — DULOXETINE HCL 60 MG PO CPEP
60.0000 mg | ORAL_CAPSULE | Freq: Every day | ORAL | Status: DC
  Administered 2017-04-10 – 2017-04-13 (×4): 60 mg via ORAL
  Filled 2017-04-10 (×4): qty 1

## 2017-04-10 MED ORDER — DEXTROSE 5 % IV SOLN
1.0000 g | Freq: Three times a day (TID) | INTRAVENOUS | Status: DC
  Filled 2017-04-10: qty 1

## 2017-04-10 MED ORDER — ROSUVASTATIN CALCIUM 20 MG PO TABS
20.0000 mg | ORAL_TABLET | Freq: Every day | ORAL | Status: DC
  Administered 2017-04-10 – 2017-04-13 (×4): 20 mg via ORAL
  Filled 2017-04-10 (×4): qty 1

## 2017-04-10 MED ORDER — DONEPEZIL HCL 5 MG PO TABS
5.0000 mg | ORAL_TABLET | Freq: Every day | ORAL | Status: DC
  Administered 2017-04-10 – 2017-04-12 (×3): 5 mg via ORAL
  Filled 2017-04-10 (×3): qty 1

## 2017-04-10 MED ORDER — SODIUM CHLORIDE 0.9 % IV SOLN
1250.0000 mg | Freq: Once | INTRAVENOUS | Status: DC
  Administered 2017-04-10: 1250 mg via INTRAVENOUS
  Filled 2017-04-10: qty 1250

## 2017-04-10 MED ORDER — ACETAMINOPHEN 650 MG RE SUPP: 650.0000 mg | Freq: Four times a day (QID) | RECTAL | Status: DC | PRN

## 2017-04-10 MED ORDER — VITAMIN D 1000 UNITS PO TABS
1000.0000 [IU] | ORAL_TABLET | Freq: Every day | ORAL | Status: DC
  Administered 2017-04-10 – 2017-04-13 (×4): 1000 [IU] via ORAL
  Filled 2017-04-10 (×4): qty 1

## 2017-04-10 MED ORDER — ACETAMINOPHEN 325 MG PO TABS
650.0000 mg | ORAL_TABLET | Freq: Once | ORAL | Status: AC
Start: 2017-04-10 — End: 2017-04-10
  Administered 2017-04-10: 650 mg via ORAL
  Filled 2017-04-10: qty 2

## 2017-04-10 MED ORDER — ADULT MULTIVITAMIN W/MINERALS CH
1.0000 | ORAL_TABLET | Freq: Every day | ORAL | Status: DC
  Administered 2017-04-10 – 2017-04-13 (×4): 1 via ORAL
  Filled 2017-04-10 (×4): qty 1

## 2017-04-10 MED ORDER — APIXABAN 2.5 MG PO TABS
2.5000 mg | ORAL_TABLET | Freq: Two times a day (BID) | ORAL | Status: DC
  Administered 2017-04-10 – 2017-04-13 (×7): 2.5 mg via ORAL
  Filled 2017-04-10 (×7): qty 1

## 2017-04-10 MED ORDER — ACETAMINOPHEN 325 MG PO TABS
650.0000 mg | ORAL_TABLET | Freq: Four times a day (QID) | ORAL | Status: DC | PRN
  Administered 2017-04-10 – 2017-04-13 (×4): 650 mg via ORAL
  Filled 2017-04-10 (×4): qty 2

## 2017-04-10 MED ORDER — FLUTICASONE FUROATE-VILANTEROL 100-25 MCG/INH IN AEPB
1.0000 | INHALATION_SPRAY | Freq: Every day | RESPIRATORY_TRACT | Status: DC
  Filled 2017-04-10: qty 28

## 2017-04-10 MED ORDER — VANCOMYCIN HCL 10 G IV SOLR: 1500.0000 mg | Freq: Once | INTRAVENOUS | Status: DC

## 2017-04-10 MED ORDER — SODIUM CHLORIDE 0.9 % IV SOLN
INTRAVENOUS | Status: DC
  Administered 2017-04-10 – 2017-04-12 (×3): via INTRAVENOUS

## 2017-04-10 MED ORDER — POTASSIUM CHLORIDE CRYS ER 20 MEQ PO TBCR
40.0000 meq | EXTENDED_RELEASE_TABLET | Freq: Once | ORAL | Status: AC
  Administered 2017-04-10: 40 meq via ORAL
  Filled 2017-04-10: qty 2

## 2017-04-10 MED ORDER — UMECLIDINIUM BROMIDE 62.5 MCG/INH IN AEPB
1.0000 | INHALATION_SPRAY | Freq: Every day | RESPIRATORY_TRACT | Status: DC
  Administered 2017-04-10 – 2017-04-13 (×4): 1 via RESPIRATORY_TRACT
  Filled 2017-04-10: qty 7

## 2017-04-10 MED ORDER — DILTIAZEM HCL ER COATED BEADS 180 MG PO CP24
300.0000 mg | ORAL_CAPSULE | Freq: Every day | ORAL | Status: DC
  Administered 2017-04-10 – 2017-04-13 (×4): 300 mg via ORAL
  Filled 2017-04-10 (×4): qty 1

## 2017-04-10 NOTE — Clinical Social Work Note (Signed)
[name] is an 84 year old female known to LCSW from her admission last week. CSW Assessment from last week copied below. There have been no significant changes to [name]'s previous status.  Briefly, [name] resides at [facility]. She has a history of dementia. [name]'s daughter is supportive and involved. [name] uses Oxygen at the ALF at night. She mostly uses a wheelchair but can transfer without much assistance when she is at her baseline.   Will follow and assist with dc planning needs.   Clinical Social Work Assessment  Patient Details  Name: [name] MRN: [id number] Date of Birth: 03-Jun-1928  Date of referral:  04/03/17               Reason for consult:  Discharge Planning                           Permission sought to share information with:    Permission granted to share information::                Name::                   Agency::                Relationship::                Contact Information:     Housing/Transportation Living arrangements for the past 2 months:  [place] of Information:  Adult Children Patient Interpreter Needed:  None Criminal Activity/Legal Involvement Pertinent to Current Situation/Hospitalization:  No - Comment as needed Significant Relationships:  Adult Children, Other Family Members Lives with:  Facility Resident Do you feel safe going back to the place where you live?  Yes Need for family participation in patient care:  Yes (Comment)  Care giving concerns: No concerns at baseline.   Social Worker assessment / plan: [name] is an 84 year old female admitted from [facility]. Spoke with [name]'s daughter today to complete assessment. [name] has been at [facility] for about six months and the plan is for return to [facility] at [facility]. [name] does not ambulate any distance. She can transfer and take a few steps. She uses a wheelchair for distance. [name] requires assistance with bathing and dressing. [name] uses O2 at the facility. She has a Conservation officer, nature  from [place] per daughter. Family will need to bring a portable tank for dc if [name] needs continuous oxygen. Per Butch Penny at [facility], [name] has only been using oxygen at the facility at night. Updated RN CM of this information. CSW will contact [name]'s daughter at her request once dc orders have been completed.   Employment status:  Retired Forensic scientist:  Information systems manager, Medicaid [place] [name] Recommendations:  Not assessed at this time Information / Referral to community resources:     Patient/Family's Response to care: [name] and family are accepting of care.  Patient/Family's Understanding of and Emotional Response to Diagnosis, Current Treatment, and Prognosis: [name] and family appear to have a good understanding of [name]'s diagnosis and treatment recommendations. No emotional distress noted.  Emotional Assessment Appearance:  Appears stated age Attitude/Demeanor/Rapport:    Affect (typically observed):  Calm Orientation:  Oriented to Self, Oriented to Place, Oriented to Situation Alcohol / Substance use:  Not Applicable Psych involvement (Current and /or in the community):  No (Comment)  Discharge Needs  Concerns to be addressed:  Discharge Planning Concerns Readmission  within the last 30 days:  No Current discharge risk:  None Barriers to Discharge:  No Barriers Identified   Shade Flood, LCSW 04/03/2017, 12:32 PM

## 2017-04-10 NOTE — H&P (Signed)
History and Physical    [name]:[id number] DOB: 1928-05-26 DOA: 04/10/2017  Referring MD/NP/PA: Julious Oka, EDP PCP: Celene Squibb, MD  Patient coming from: Assisted living facility  Chief Complaint: Fall, confusion  HPI: [name] is a 84 y.o. female who was brought in today from her assisted living facility after suffering a fall this morning around 4 AM while she was transferring from her wheelchair to bed.  Most of history is obtained by her daughter at bedside as she is quite lethargic.  Patient was hospitalized last week for COPD exacerbation discharged home on prednisone taper and nebs.  She has become progressively weaker over the past week.  She does have a history significant for dementia, COPD, A. fib chronically anticoagulated on Eliquis, hypertension.  Of note in 2010 she had an episode of pneumonia that culminated in septic shock requiring transfer to [facility] and a stay in the ICU.  In the ED she was noted to be afebrile, blood pressure is within normal limits with a systolic of around 732-202, she is hypoxic with an O2 sat of around 84-89% on room air labs are significant for creatinine of 1.15, WBC count of 17.2.,  Potassium of 3.1.  Chest x-ray shows developing airspace disease in the right midlung representing pneumonia.Because of her fall and the fact that she is anticoagulated a CT scan of the head and C-spine was ordered without significant abnormalities, lumbar spine x-ray does show an L1 compression fracture that is acute.  Admission has been requested.  Past Medical/Surgical History: Past Medical History:  Diagnosis Date  . Anemia   . Atrial fibrillation ([place])   . Back pain   . Bacterial pneumonia   . Cancer (HCC)    Skin   . Chronic respiratory failure ([place])   . COPD (chronic obstructive pulmonary disease) ([place])   . Dementia   . Depression   . Diverticulosis   . Diverticulosis   . Fatty liver   . Fibromyalgia   . Fibromyalgia   .  Gastric polyp 09/08/11   at the anastomosis-inflammatory  . Generalized anxiety disorder   . Generalized anxiety disorder   . GERD (gastroesophageal reflux disease)   . 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 Hypercholesteremia   . Hypertension   . Insomnia   . Internal hemorrhoids   . On home O2    qhs  . Osteoporosis   . Rheumatoid arteritis   . Ulcer    stomach  . Vitamin D deficiency     Past Surgical History:  Procedure Laterality Date  . ABDOMINAL HYSTERECTOMY    . ABDOMINAL SURGERY     removed patial stomach from ulcer  . COLONOSCOPY  04-2001   internal hemorrhoids, panocolonic diverticulosis, and colon polyps   . UPPER GASTROINTESTINAL ENDOSCOPY      Social History:  reports that  has never smoked. she has never used smokeless tobacco. She reports that she does not drink alcohol or use drugs.  Allergies: Allergies  Allergen Reactions  . Other     Band Aids   . Tape   . Lipitor [Atorvastatin Calcium] Rash and Other (See Comments)    Muscle pain  . Niaspan [Niacin Er] Rash and Other (See Comments)    Muscle pain    Family History:  Family History  Problem Relation Age of Onset  . Heart disease Father   . Kidney disease Mother        kidney cancer  . Colon cancer  Daughter 69    Prior to Admission medications   Medication Sig Start Date End Date Taking? Authorizing Provider  albuterol (PROAIR HFA) 108 (90 BASE) MCG/ACT inhaler Inhale 2 puffs into the lungs every 6 (six) hours as needed for wheezing or shortness of breath. For rescue     [provider]  apixaban (ELIQUIS) 2.5 MG TABS tablet Take 2.5 mg by mouth 2 (two) times daily.    [provider]  budesonide-formoterol (SYMBICORT) 80-4.5 MCG/ACT inhaler Inhale 2 puffs into the lungs 2 (two) times daily.    [provider]  CARTIA XT 300 MG 24 hr capsule TAKE ONE CAPSULE BY MOUTH EVERY DAY 09/25/14   Arnoldo Lenis, MD  Cholecalciferol (VITAMIN D3) 1000 UNITS CAPS Take  1 capsule by mouth daily.    [provider]  donepezil (ARICEPT) 5 MG tablet Take 5 mg by mouth at bedtime. 05/06/16   [provider]  DULoxetine (CYMBALTA) 60 MG capsule Take 60 mg by mouth daily.      [provider]  Fluticasone-Umeclidin-Vilant (TRELEGY ELLIPTA) 100-62.5-25 MCG/INH AEPB Inhale 1 puff into the lungs daily.    [provider]  furosemide (LASIX) 20 MG tablet Take 20 mg by mouth daily.     [provider]  ipratropium-albuterol (DUONEB) 0.5-2.5 (3) MG/3ML SOLN Take 3 mLs by nebulization every 6 (six) hours. 04/02/17   Orson Eva, MD  Lactobacillus (ACIDOPHILUS/PECTIN PO) Take 1 capsule by mouth daily.    [provider]  LORazepam (ATIVAN) 0.5 MG tablet Take 0.25 mg by mouth 2 (two) times daily.    [provider]  LYRICA 100 MG capsule Take 100 mg by mouth 3 (three) times daily. 12/07/15   [provider]  methotrexate (RHEUMATREX) 2.5 MG tablet Take 12.5 mg by mouth once a week. *Taken on Thursdays at 1200Caution:Chemotherapy. Protect from light.    [provider]  Multiple Vitamins-Minerals (MULTIVITAMIN WITH MINERALS) tablet Take 1 tablet by mouth daily.    [provider]  oxyCODONE-acetaminophen (PERCOCET) 7.5-325 MG tablet Take one tablet by mouth every 4 hours as needed for pain. Max APAP 3gm/24hrs 04/03/17   Orson Eva, MD  pantoprazole (PROTONIX) 40 MG tablet Take 40 mg by mouth 2 (two) times daily.     [provider]  phenylephrine-shark liver oil-mineral oil-petrolatum (PREPARATION H) 0.25-3-14-71.9 % rectal ointment Place 1 application rectally 2 (two) times daily as needed for hemorrhoids.    [provider]  polyethylene glycol (MIRALAX / GLYCOLAX) packet Take 17 g by mouth daily as needed for mild constipation or moderate constipation.     [provider]  potassium chloride (K-DUR,KLOR-CON) 10 MEQ tablet Take 10 mEq by mouth daily. Take 1 tablet by  mouth once a day - take with Lasix    [provider]  predniSONE (DELTASONE) 10 MG tablet Take 6 tablets (60 mg total) by mouth daily with breakfast. And decrease by one tablet daily 04/04/17   Tat, Shanon Brow, MD  raloxifene (EVISTA) 60 MG tablet Take 60 mg by mouth daily.    [provider]  rosuvastatin (CRESTOR) 20 MG tablet Take 20 mg by mouth daily.     [provider]    Review of Systems:  Unable to obtain given confusion   Physical Exam: Vitals:   04/10/17 0530 04/10/17 0600 04/10/17 0700 04/10/17 0714  BP: 137/64 131/74 122/62   Pulse: 97 90 90   Resp:    18  Temp:  99.7 F (37.6 C)  TempSrc:    Oral  SpO2: 91% (!) 89% 91%   Weight:      Height:         Constitutional: Sleepy, difficult to arouse, will open eyes to voice but is not responding to questions Eyes: PERRL, lids and conjunctivae normal ENMT: Mucous membranes are moist. Posterior pharynx clear of any exudate or lesions.Normal dentition.  Neck: normal, supple, no masses, no thyromegaly Respiratory: No wheezes, no crackles, significant rhonchi to the right mid and basal lung fields.  Cardiovascular: Regular rate and rhythm, no murmurs / rubs / gallops. No extremity edema. 2+ pedal pulses. No carotid bruits.  Abdomen: no tenderness, no masses palpated. No hepatosplenomegaly. Bowel sounds positive.  Musculoskeletal: no clubbing / cyanosis. No joint deformity upper and lower extremities. Good ROM, no contractures. Normal muscle tone.  Skin: no rashes, lesions, ulcers. No induration Neurologic: Unable to fully assess given current mental state but moves all 4 spontaneously Psychiatric: Unable to fully assess given current mental state   Labs on Admission: I have personally reviewed the following labs and imaging studies  CBC: Recent Labs  Lab 04/10/17 0557  WBC 17.2*  NEUTROABS 13.9*  HGB 12.6  HCT 41.2  MCV 87.8  PLT 573*   Basic Metabolic Panel: Recent Labs  Lab  04/10/17 0557  NA 134*  K 3.1*  CL 95*  CO2 27  GLUCOSE 171*  BUN 23*  CREATININE 1.15*  CALCIUM 9.3   GFR: Estimated Creatinine Clearance: 36.9 mL/min (A) (by C-G formula based on SCr of 1.15 mg/dL (H)). Liver Function Tests: No results for input(s): AST, ALT, ALKPHOS, BILITOT, PROT, ALBUMIN in the last 168 hours. No results for input(s): LIPASE, AMYLASE in the last 168 hours. No results for input(s): AMMONIA in the last 168 hours. Coagulation Profile: No results for input(s): INR, PROTIME in the last 168 hours. Cardiac Enzymes: No results for input(s): CKTOTAL, CKMB, CKMBINDEX, TROPONINI in the last 168 hours. BNP (last 3 results) No results for input(s): PROBNP in the last 8760 hours. HbA1C: No results for input(s): HGBA1C in the last 72 hours. CBG: Recent Labs  Lab 04/03/17 1147  GLUCAP 210*   Lipid Profile: No results for input(s): CHOL, HDL, LDLCALC, TRIG, CHOLHDL, LDLDIRECT in the last 72 hours. Thyroid Function Tests: No results for input(s): TSH, T4TOTAL, FREET4, T3FREE, THYROIDAB in the last 72 hours. Anemia Panel: No results for input(s): VITAMINB12, FOLATE, FERRITIN, TIBC, IRON, RETICCTPCT in the last 72 hours. Urine analysis:    Component Value Date/Time   COLORURINE YELLOW 04/10/2017 [place] 04/10/2017 0514   LABSPEC 1.016 04/10/2017 0514   PHURINE 7.0 04/10/2017 0514   GLUCOSEU NEGATIVE 04/10/2017 0514   HGBUR NEGATIVE 04/10/2017 0514   BILIRUBINUR NEGATIVE 04/10/2017 0514   KETONESUR NEGATIVE 04/10/2017 0514   PROTEINUR 30 (A) 04/10/2017 0514   UROBILINOGEN 1.0 10/11/2014 1428   NITRITE NEGATIVE 04/10/2017 0514   LEUKOCYTESUR NEGATIVE 04/10/2017 0514   Sepsis Labs: @LABRCNTIP (procalcitonin:4,lacticidven:4) ) Recent Results (from the past 240 hour(s))  Respiratory Panel by PCR     Status: None   Collection Time: 04/01/17 11:20 AM  Result Value Ref Range Status   Adenovirus NOT DETECTED NOT DETECTED Final   Coronavirus 229E  NOT DETECTED NOT DETECTED Final   Coronavirus HKU1 NOT DETECTED NOT DETECTED Final   Coronavirus NL63 NOT DETECTED NOT DETECTED Final   Coronavirus OC43 NOT DETECTED NOT DETECTED Final   Metapneumovirus NOT DETECTED NOT DETECTED Final  Rhinovirus / Enterovirus NOT DETECTED NOT DETECTED Final   Influenza A NOT DETECTED NOT DETECTED Final   Influenza B NOT DETECTED NOT DETECTED Final   Parainfluenza Virus 1 NOT DETECTED NOT DETECTED Final   Parainfluenza Virus 2 NOT DETECTED NOT DETECTED Final   Parainfluenza Virus 3 NOT DETECTED NOT DETECTED Final   Parainfluenza Virus 4 NOT DETECTED NOT DETECTED Final   Respiratory Syncytial Virus NOT DETECTED NOT DETECTED Final   Bordetella pertussis NOT DETECTED NOT DETECTED Final   Chlamydophila pneumoniae NOT DETECTED NOT DETECTED Final   Mycoplasma pneumoniae NOT DETECTED NOT DETECTED Final    Comment: Performed at [facility], [place] [address], [place], [place] [address]     Radiological Exams on Admission: Dg Chest 1 View  Result Date: 04/10/2017 CLINICAL DATA:  Patient fell out of bed.  Low back pain. EXAM: CHEST 1 VIEW COMPARISON:  03/31/2017 FINDINGS: Mild cardiac enlargement. No vascular congestion or edema. Interstitial fibrosis in the lungs. There is interval development of superimposed airspace disease in the right mid lung likely representing developing pneumonia. Aspiration or contusion could also potentially have this appearance. Calcified and tortuous aorta. Degenerative changes in the shoulders. IMPRESSION: Cardiac enlargement. Interstitial fibrosis in the lungs. Developing airspace disease in the right mid lung likely represent pneumonia. Aspiration or contusion could also have this appearance. Electronically Signed   By: Lucienne Capers M.D.   On: 04/10/2017 06:41   Dg Lumbar Spine Complete  Result Date: 04/10/2017 CLINICAL DATA:  Patient fell out of bed.  Low back pain. EXAM: LUMBAR SPINE - COMPLETE 4+ VIEW COMPARISON:   10/25/2016 FINDINGS: Mild lumbar scoliosis convex towards the right. Degenerative changes throughout the lumbar spine with narrowed interspaces and endplate hypertrophic changes. Interval anterior compression of L1 since previous study. Given the history of trauma, this could represent acute compression fracture. No retropulsion of fracture fragments. Degenerative disc disease in the lower lumbar spine. Mild compression of L4 is unchanged. Vascular calcifications throughout the aorta. IMPRESSION: Anterior compression of L1, new since previous study and likely acute. This represents approximately 25% loss of height. Diffuse degenerative change throughout the lumbar spine. Aortic atherosclerosis. Electronically Signed   By: Lucienne Capers M.D.   On: 04/10/2017 06:43   Dg Pelvis 1-2 Views  Result Date: 04/10/2017 CLINICAL DATA:  Patient fell out of bed.  Low back pain. EXAM: PELVIS - 1-2 VIEW COMPARISON:  CT abdomen and pelvis 07/26/2016. FINDINGS: Degenerative changes in the lower lumbar spine and hips. No evidence of acute fracture or dislocation in the pelvis or hips. SI joints and symphysis pubis are not displaced. No focal bone lesion or bone destruction. IMPRESSION: Degenerative changes in the lower lumbar spine and both hips. No acute bony abnormalities. Electronically Signed   By: Lucienne Capers M.D.   On: 04/10/2017 06:40   Ct Head Wo Contrast  Result Date: 04/10/2017 CLINICAL DATA:  84 year old female fell out of bed. Complaining of lower mid back pain. Does not recall hitting head. Headaches and bilateral neck stiffness. Initial encounter. EXAM: CT HEAD WITHOUT CONTRAST CT CERVICAL SPINE WITHOUT CONTRAST TECHNIQUE: Multidetector CT imaging of the head and cervical spine was performed following the standard protocol without intravenous contrast. Multiplanar CT image reconstructions of the cervical spine were also generated. COMPARISON:  07/16/2016 CT head and cervical spine. FINDINGS: CT HEAD  FINDINGS Brain: No intracranial hemorrhage or CT evidence of large acute infarct. Chronic microvascular changes. Focal fat anterior falx otherwise, no intracranial mass lesion noted on this  unenhanced exam. Vascular: Ectatic basilar artery.  Calcified carotid arteries. Skull: No skull fracture Sinuses/Orbits: Chronic nonspecific soft tissue swelling inferior medial aspect of the left globe. Partial opacification left frontal sinus. Other: Mastoid air cells and middle ear cavities are clear. CT CERVICAL SPINE FINDINGS Alignment: Minimal curvature convex left. Minimal anterior slip C3 and C4 and minimal retrolisthesis C5 unchanged from prior exam. Skull base and vertebrae: Evaluation slightly limited by artifact from patient's shoulders/habitus. No acute cervical spine fracture noted. Soft tissues and spinal canal: No abnormal prevertebral soft tissue swelling. Disc levels:  Evaluation limited by habitus. Upper chest: Bilateral upper lobe airspace disease greater on right. Please see chest x-ray report from same date. Other: Left thyroid 1.3 centimeter nodule and right thyroid 6 millimeter nodule incidentally noted. Carotid bifurcation calcifications. IMPRESSION: No skull fracture or intracranial hemorrhage. No cervical spine fracture or abnormal prevertebral soft tissue swelling. Alignment similar to prior exam. Evaluation slightly limited by patient's habitus/shoulder artifact. Bilateral upper lobe airspace disease greater on right. Please see chest x-ray report from same date. Electronically Signed   By: Genia Del M.D.   On: 04/10/2017 07:10   Ct Cervical Spine Wo Contrast  Result Date: 04/10/2017 CLINICAL DATA:  84 year old female fell out of bed. Complaining of lower mid back pain. Does not recall hitting head. Headaches and bilateral neck stiffness. Initial encounter. EXAM: CT HEAD WITHOUT CONTRAST CT CERVICAL SPINE WITHOUT CONTRAST TECHNIQUE: Multidetector CT imaging of the head and cervical spine was  performed following the standard protocol without intravenous contrast. Multiplanar CT image reconstructions of the cervical spine were also generated. COMPARISON:  07/16/2016 CT head and cervical spine. FINDINGS: CT HEAD FINDINGS Brain: No intracranial hemorrhage or CT evidence of large acute infarct. Chronic microvascular changes. Focal fat anterior falx otherwise, no intracranial mass lesion noted on this unenhanced exam. Vascular: Ectatic basilar artery.  Calcified carotid arteries. Skull: No skull fracture Sinuses/Orbits: Chronic nonspecific soft tissue swelling inferior medial aspect of the left globe. Partial opacification left frontal sinus. Other: Mastoid air cells and middle ear cavities are clear. CT CERVICAL SPINE FINDINGS Alignment: Minimal curvature convex left. Minimal anterior slip C3 and C4 and minimal retrolisthesis C5 unchanged from prior exam. Skull base and vertebrae: Evaluation slightly limited by artifact from patient's shoulders/habitus. No acute cervical spine fracture noted. Soft tissues and spinal canal: No abnormal prevertebral soft tissue swelling. Disc levels:  Evaluation limited by habitus. Upper chest: Bilateral upper lobe airspace disease greater on right. Please see chest x-ray report from same date. Other: Left thyroid 1.3 centimeter nodule and right thyroid 6 millimeter nodule incidentally noted. Carotid bifurcation calcifications. IMPRESSION: No skull fracture or intracranial hemorrhage. No cervical spine fracture or abnormal prevertebral soft tissue swelling. Alignment similar to prior exam. Evaluation slightly limited by patient's habitus/shoulder artifact. Bilateral upper lobe airspace disease greater on right. Please see chest x-ray report from same date. Electronically Signed   By: Genia Del M.D.   On: 04/10/2017 07:10    EKG: Independently reviewed.  None obtained in the emergency department  Assessment/Plan Principal Problem:   HCAP (healthcare-associated  pneumonia) Active Problems:   COPD (chronic obstructive pulmonary disease) (HCC)   GERD (gastroesophageal reflux disease)   Acute encephalopathy   Dementia   Acute on chronic respiratory failure with hypoxia (HCC)   CKD (chronic kidney disease), stage III (HCC)   Closed compression fracture of L1 lumbar vertebra ([place])    Healthcare associated pneumonia -Agree with cefepime and vancomycin that has been initiated in the emergency  department. -We will check MRSA PCR and if negative can discontinue vancomycin. -Blood/sputum cultures ordered. -Influenza PCR requested. -Strep pneumo and Legionella urine antigens requested.  Acute metabolic encephalopathy -Likely due to acute infectious process on top of baseline dementia. -Continue to monitor for now. -Avoid sedating medications.  Acute compression fracture of L1 lumbar vertebrae -Treat symptomatically.  COPD -Appears compensated at present, continue Symbicort, albuterol nebs.  Hypokalemia -Replace orally, check magnesium level.  Chronic atrial fibrillation -Rate controlled, anticoagulated on Eliquis.   DVT prophylaxis: Eliquis Code Status: Full code Family Communication: Daughter at bedside updated on plan of care and all questions answered Disposition Plan: Back to ALF versus SNF pending functional status once medically cleared Consults called: None Admission status: Inpatient   Time Spent: 85 minutes  Ormand Senn Isaac Bliss MD [facility] Pager [phone]  If 7PM-7AM, please contact night-coverage [email or web address] Password [facility]  04/10/2017, 8:07 AM

## 2017-04-10 NOTE — ED Triage Notes (Signed)
Pt brought in by [facility] for c/o fall; pt states she fell out of the bed and laid in the floor yelling; pt is unsure how long she was in the floor; pt is c/o lower back pain

## 2017-04-10 NOTE — ED Provider Notes (Signed)
[facility] EMERGENCY DEPARTMENT Provider Note   CSN: [id number] Arrival date & time: 04/10/17  1884     History   Chief Complaint Chief Complaint  Patient presents with  . Fall  Level 5 caveat due to dementia  HPI [name] is a 84 y.o. female.  The history is provided by the patient, the nursing home and a relative.  Fall  This is a new problem. The current episode started 1 to 2 hours ago. The problem occurs constantly. The problem has not changed since onset.Pertinent negatives include no abdominal pain. Nothing aggravates the symptoms. Nothing relieves the symptoms.   Patient with a history of atrial fibrillation on Eliquis, history of COPD on oxygen, dementia presents from assisted living facility after fall Patient apparently was getting back into bed after using the restroom, and fell down hitting her head. Unknown LOC. She reports headache, back pain. Family at bedside, reports patient was just in the hospital for a COPD exacerbation  I called [facility] and they report patient actually got to the restroom by herself but fell while trying to get back into the bed Family had reported the patient has minimal movement and usually has to pivot into a wheelchair Past Medical History:  Diagnosis Date  . Anemia   . Atrial fibrillation ([place])   . Back pain   . Bacterial pneumonia   . Cancer (HCC)    Skin   . Chronic respiratory failure ([place])   . COPD (chronic obstructive pulmonary disease) ([place])   . Dementia   . Depression   . Diverticulosis   . Diverticulosis   . Fatty liver   . Fibromyalgia   . Fibromyalgia   . Gastric polyp 09/08/11   at the anastomosis-inflammatory  . Generalized anxiety disorder   . Generalized anxiety disorder   . GERD (gastroesophageal reflux disease)   . 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 Hypercholesteremia   . Hypertension   . Insomnia   . Internal hemorrhoids   . On home O2    qhs  .  Osteoporosis   . Rheumatoid arteritis   . Ulcer    stomach  . Vitamin D deficiency     Patient Active Problem List   Diagnosis Date Noted  . Acute on chronic respiratory failure with hypoxia ([place]) 04/01/2017  . CKD (chronic kidney disease), stage III ([place]) 04/01/2017  . COPD with acute exacerbation ([place])   . COPD exacerbation ([place]) 03/31/2017  . Depression 03/31/2017  . Dementia 03/31/2017  . Peripheral neuropathy 08/01/2016  . AKI (acute kidney injury) ([place]) 07/26/2016  . Atrial fibrillation ([place]) 07/26/2016  . Hypertension 07/26/2016  . Acute gastroenteritis 07/26/2016  . Altered mental status 07/26/2016  . Prolonged QT interval 07/26/2016  . Syncope 09/12/2014  . CKD (chronic kidney disease) 09/12/2014  . Hyperglycemia 09/12/2014  . Hypoxia 09/12/2014  . Acute encephalopathy 09/12/2014  . Chest pain 07/21/2013  . Chronic respiratory failure ([place]) 07/21/2013  . Hypokalemia 07/21/2013  . Atrial fibrillation with RVR ([place]) 07/21/2013  . Knee pain 05/21/2013  . Fibromyalgia   . COPD (chronic obstructive pulmonary disease) ([place])   . 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 GERD (gastroesophageal reflux disease)   . Rheumatoid arteritis   . Back pain   . Fracture of distal fibula 09/20/2010    Past Surgical History:  Procedure Laterality Date  . ABDOMINAL HYSTERECTOMY    . ABDOMINAL SURGERY     removed patial stomach from ulcer  .  COLONOSCOPY  04-2001   internal hemorrhoids, panocolonic diverticulosis, and colon polyps   . UPPER GASTROINTESTINAL ENDOSCOPY      OB History    Gravida Para Term Preterm AB Living   6 6 6     4    SAB TAB Ectopic Multiple Live Births                   Home Medications    Prior to Admission medications   Medication Sig Start Date End Date Taking? Authorizing Provider  albuterol (PROAIR HFA) 108 (90 BASE) MCG/ACT inhaler Inhale 2 puffs into the lungs every 6 (six) hours as needed for wheezing or shortness of breath. For rescue      [provider]  apixaban (ELIQUIS) 2.5 MG TABS tablet Take 2.5 mg by mouth 2 (two) times daily.    [provider]  budesonide-formoterol (SYMBICORT) 80-4.5 MCG/ACT inhaler Inhale 2 puffs into the lungs 2 (two) times daily.    [provider]  CARTIA XT 300 MG 24 hr capsule TAKE ONE CAPSULE BY MOUTH EVERY DAY 09/25/14   Arnoldo Lenis, MD  Cholecalciferol (VITAMIN D3) 1000 UNITS CAPS Take 1 capsule by mouth daily.    [provider]  donepezil (ARICEPT) 5 MG tablet Take 5 mg by mouth at bedtime. 05/06/16   [provider]  DULoxetine (CYMBALTA) 60 MG capsule Take 60 mg by mouth daily.      [provider]  Fluticasone-Umeclidin-Vilant (TRELEGY ELLIPTA) 100-62.5-25 MCG/INH AEPB Inhale 1 puff into the lungs daily.    [provider]  furosemide (LASIX) 20 MG tablet Take 20 mg by mouth daily.     [provider]  ipratropium-albuterol (DUONEB) 0.5-2.5 (3) MG/3ML SOLN Take 3 mLs by nebulization every 6 (six) hours. 04/02/17   Orson Eva, MD  Lactobacillus (ACIDOPHILUS/PECTIN PO) Take 1 capsule by mouth daily.    [provider]  LORazepam (ATIVAN) 0.5 MG tablet Take 0.25 mg by mouth 2 (two) times daily.    [provider]  LYRICA 100 MG capsule Take 100 mg by mouth 3 (three) times daily. 12/07/15   [provider]  methotrexate (RHEUMATREX) 2.5 MG tablet Take 12.5 mg by mouth once a week. *Taken on Thursdays at 1200Caution:Chemotherapy. Protect from light.    [provider]  Multiple Vitamins-Minerals (MULTIVITAMIN WITH MINERALS) tablet Take 1 tablet by mouth daily.    [provider]  oxyCODONE-acetaminophen (PERCOCET) 7.5-325 MG tablet Take one tablet by mouth every 4 hours as needed for pain. Max APAP 3gm/24hrs 04/03/17   Orson Eva, MD  pantoprazole (PROTONIX) 40 MG tablet Take 40 mg by mouth 2 (two) times daily.     [provider]  phenylephrine-shark liver oil-mineral  oil-petrolatum (PREPARATION H) 0.25-3-14-71.9 % rectal ointment Place 1 application rectally 2 (two) times daily as needed for hemorrhoids.    [provider]  polyethylene glycol (MIRALAX / GLYCOLAX) packet Take 17 g by mouth daily as needed for mild constipation or moderate constipation.     [provider]  potassium chloride (K-DUR,KLOR-CON) 10 MEQ tablet Take 10 mEq by mouth daily. Take 1 tablet by mouth once a day - take with Lasix    [provider]  predniSONE (DELTASONE) 10 MG tablet Take 6 tablets (60 mg total) by mouth daily with breakfast. And decrease by one tablet daily 04/04/17   Tat, Shanon Brow, MD  raloxifene (EVISTA) 60 MG tablet Take 60 mg by mouth daily.    [provider]  rosuvastatin (CRESTOR) 20 MG tablet Take 20 mg by mouth daily.     [provider]    Family History Family History  Problem Relation Age of Onset  . Heart disease Father   . Kidney disease Mother        kidney cancer  . Colon cancer Daughter 44    Social History Social History   Tobacco Use  . Smoking status: Never Smoker  . Smokeless tobacco: Never Used  Substance Use Topics  . Alcohol use: No  . Drug use: No     Allergies   Other; Tape; Lipitor [atorvastatin calcium]; and Niaspan [niacin er]   Review of Systems Review of Systems  Unable to perform ROS: Dementia  Gastrointestinal: Negative for abdominal pain.     Physical Exam Updated Vital Signs BP 122/68 (BP Location: Left Arm)   Pulse 90   Temp (!) 100.6 F (38.1 C) (Oral)   Resp 16   Ht 1.524 m (5')   Wt 98 kg (216 lb)   SpO2 93%   BMI 42.18 kg/m   Physical Exam  CONSTITUTIONAL: Elderly, chronically ill-appearing, no acute distress HEAD: Normocephalic/atraumatic EYES: EOMI/PERRL ENMT: Mucous membranes moist NECK: supple no meningeal signs SPINE/BACK: Diffuse cervical/thoracic/lumbar tenderness No bruising/crepitance/stepoffs noted to spine CV: Irregular, no loud  murmurs LUNGS: Lungs are clear to auscultation bilaterally, no apparent distress ABDOMEN: soft, nontender GU:no cva tenderness NEURO: Pt is sleeping, but easily arousable, no focal motor deficits noted EXTREMITIES: pulses normal/equal, full ROM, pelvis stable, all extremities/joints palpated/ranged and nontender except for right hip SKIN: warm, color normal PSYCH: Unable to assess ED Treatments / Results  Labs (all labs ordered are listed, but only abnormal results are displayed) Labs Reviewed  BASIC METABOLIC PANEL - Abnormal; Notable for the following components:      Result Value   Sodium 134 (*)    Potassium 3.1 (*)    Chloride 95 (*)    Glucose, Bld 171 (*)    BUN 23 (*)    Creatinine, Ser 1.15 (*)    GFR calc non Af Amer 42 (*)    GFR calc Af Amer 48 (*)    All other components within normal limits  CBC WITH DIFFERENTIAL/PLATELET - Abnormal; Notable for the following components:   WBC 17.2 (*)    RDW 16.7 (*)    Platelets 138 (*)    Neutro Abs 13.9 (*)    Monocytes Absolute 1.2 (*)    All other components within normal limits  URINALYSIS, ROUTINE W REFLEX MICROSCOPIC - Abnormal; Notable for the following components:   Protein, ur 30 (*)    Bacteria, UA RARE (*)    Squamous Epithelial / LPF 0-5 (*)    All other components within normal limits    EKG  EKG Interpretation None       Radiology Dg Chest 1 View  Result Date: 04/10/2017 CLINICAL DATA:  Patient fell out of bed.  Low back pain. EXAM: CHEST 1 VIEW COMPARISON:  03/31/2017 FINDINGS: Mild cardiac enlargement. No vascular congestion or edema. Interstitial fibrosis in the lungs. There is interval development of superimposed airspace disease in the right mid lung likely representing developing pneumonia. Aspiration or contusion could also potentially have this appearance. Calcified and tortuous aorta. Degenerative changes in the shoulders. IMPRESSION: Cardiac enlargement. Interstitial fibrosis in the lungs.  Developing airspace disease in the right mid lung likely represent pneumonia. Aspiration or contusion could also have this appearance. Electronically Signed   By: Gwyndolyn Saxon  Gerilyn Nestle M.D.   On: 04/10/2017 06:41   Dg Lumbar Spine Complete  Result Date: 04/10/2017 CLINICAL DATA:  Patient fell out of bed.  Low back pain. EXAM: LUMBAR SPINE - COMPLETE 4+ VIEW COMPARISON:  10/25/2016 FINDINGS: Mild lumbar scoliosis convex towards the right. Degenerative changes throughout the lumbar spine with narrowed interspaces and endplate hypertrophic changes. Interval anterior compression of L1 since previous study. Given the history of trauma, this could represent acute compression fracture. No retropulsion of fracture fragments. Degenerative disc disease in the lower lumbar spine. Mild compression of L4 is unchanged. Vascular calcifications throughout the aorta. IMPRESSION: Anterior compression of L1, new since previous study and likely acute. This represents approximately 25% loss of height. Diffuse degenerative change throughout the lumbar spine. Aortic atherosclerosis. Electronically Signed   By: Lucienne Capers M.D.   On: 04/10/2017 06:43   Dg Pelvis 1-2 Views  Result Date: 04/10/2017 CLINICAL DATA:  Patient fell out of bed.  Low back pain. EXAM: PELVIS - 1-2 VIEW COMPARISON:  CT abdomen and pelvis 07/26/2016. FINDINGS: Degenerative changes in the lower lumbar spine and hips. No evidence of acute fracture or dislocation in the pelvis or hips. SI joints and symphysis pubis are not displaced. No focal bone lesion or bone destruction. IMPRESSION: Degenerative changes in the lower lumbar spine and both hips. No acute bony abnormalities. Electronically Signed   By: Lucienne Capers M.D.   On: 04/10/2017 06:40   Ct Head Wo Contrast  Result Date: 04/10/2017 CLINICAL DATA:  84 year old female fell out of bed. Complaining of lower mid back pain. Does not recall hitting head. Headaches and bilateral neck stiffness. Initial  encounter. EXAM: CT HEAD WITHOUT CONTRAST CT CERVICAL SPINE WITHOUT CONTRAST TECHNIQUE: Multidetector CT imaging of the head and cervical spine was performed following the standard protocol without intravenous contrast. Multiplanar CT image reconstructions of the cervical spine were also generated. COMPARISON:  07/16/2016 CT head and cervical spine. FINDINGS: CT HEAD FINDINGS Brain: No intracranial hemorrhage or CT evidence of large acute infarct. Chronic microvascular changes. Focal fat anterior falx otherwise, no intracranial mass lesion noted on this unenhanced exam. Vascular: Ectatic basilar artery.  Calcified carotid arteries. Skull: No skull fracture Sinuses/Orbits: Chronic nonspecific soft tissue swelling inferior medial aspect of the left globe. Partial opacification left frontal sinus. Other: Mastoid air cells and middle ear cavities are clear. CT CERVICAL SPINE FINDINGS Alignment: Minimal curvature convex left. Minimal anterior slip C3 and C4 and minimal retrolisthesis C5 unchanged from prior exam. Skull base and vertebrae: Evaluation slightly limited by artifact from patient's shoulders/habitus. No acute cervical spine fracture noted. Soft tissues and spinal canal: No abnormal prevertebral soft tissue swelling. Disc levels:  Evaluation limited by habitus. Upper chest: Bilateral upper lobe airspace disease greater on right. Please see chest x-ray report from same date. Other: Left thyroid 1.3 centimeter nodule and right thyroid 6 millimeter nodule incidentally noted. Carotid bifurcation calcifications. IMPRESSION: No skull fracture or intracranial hemorrhage. No cervical spine fracture or abnormal prevertebral soft tissue swelling. Alignment similar to prior exam. Evaluation slightly limited by patient's habitus/shoulder artifact. Bilateral upper lobe airspace disease greater on right. Please see chest x-ray report from same date. Electronically Signed   By: Genia Del M.D.   On: 04/10/2017 07:10   Ct  Cervical Spine Wo Contrast  Result Date: 04/10/2017 CLINICAL DATA:  84 year old female fell out of bed. Complaining of lower mid back pain. Does not recall hitting head. Headaches and bilateral neck stiffness. Initial encounter. EXAM: CT HEAD WITHOUT CONTRAST  CT CERVICAL SPINE WITHOUT CONTRAST TECHNIQUE: Multidetector CT imaging of the head and cervical spine was performed following the standard protocol without intravenous contrast. Multiplanar CT image reconstructions of the cervical spine were also generated. COMPARISON:  07/16/2016 CT head and cervical spine. FINDINGS: CT HEAD FINDINGS Brain: No intracranial hemorrhage or CT evidence of large acute infarct. Chronic microvascular changes. Focal fat anterior falx otherwise, no intracranial mass lesion noted on this unenhanced exam. Vascular: Ectatic basilar artery.  Calcified carotid arteries. Skull: No skull fracture Sinuses/Orbits: Chronic nonspecific soft tissue swelling inferior medial aspect of the left globe. Partial opacification left frontal sinus. Other: Mastoid air cells and middle ear cavities are clear. CT CERVICAL SPINE FINDINGS Alignment: Minimal curvature convex left. Minimal anterior slip C3 and C4 and minimal retrolisthesis C5 unchanged from prior exam. Skull base and vertebrae: Evaluation slightly limited by artifact from patient's shoulders/habitus. No acute cervical spine fracture noted. Soft tissues and spinal canal: No abnormal prevertebral soft tissue swelling. Disc levels:  Evaluation limited by habitus. Upper chest: Bilateral upper lobe airspace disease greater on right. Please see chest x-ray report from same date. Other: Left thyroid 1.3 centimeter nodule and right thyroid 6 millimeter nodule incidentally noted. Carotid bifurcation calcifications. IMPRESSION: No skull fracture or intracranial hemorrhage. No cervical spine fracture or abnormal prevertebral soft tissue swelling. Alignment similar to prior exam. Evaluation slightly  limited by patient's habitus/shoulder artifact. Bilateral upper lobe airspace disease greater on right. Please see chest x-ray report from same date. Electronically Signed   By: Genia Del M.D.   On: 04/10/2017 07:10    Procedures Procedures   Medications Ordered in ED Medications  ceFEPIme (MAXIPIME) 1 g in dextrose 5 % 50 mL IVPB (not administered)  vancomycin (VANCOCIN) 1,250 mg in sodium chloride 0.9 % 250 mL IVPB (not administered)  acetaminophen (TYLENOL) tablet 650 mg (650 mg Oral Given 04/10/17 0553)     Initial Impression / Assessment and Plan / ED Course  I have reviewed the triage vital signs and the nursing notes.  Pertinent labs & imaging results that were available during my care of the patient were reviewed by me and considered in my medical decision making (see chart for details).     5:22 AM Patient presents for probable mechanical fall, will need imaging of his head due to the fact she is on Eliquis and no poor historian She has diffuse spinal tenderness will obtain imaging. She has had mild tenderness to palpation right hip, x-rays pending Noted to have a fever will need to check chest x-ray and urinalysis 7:19 AM CT head and neck are negative Chest x-ray reviewed by myself and noted to have an obvious pneumonia.  She also has had an increased oxygen requirement, therefore she would need to be admitted for IV antibiotics and close monitoring She also has noted to have a lumbar compression fracture, but this will need to be for pain control as she has no neuro deficit  Discussed with Dr. Jerilee Hoh for admission Final Clinical Impressions(s) / ED Diagnoses   Final diagnoses:  HCAP (healthcare-associated pneumonia)  Closed compression fracture of first lumbar vertebra, initial encounter [facility])    ED Discharge Orders    None       Ripley Fraise, MD 04/10/17 [phone]

## 2017-04-10 NOTE — ED Notes (Signed)
Patient does not ambulate but pivots from chair to bed. Pt states she does not walk. Recently admitted for decreased oxygen saturation with ambulation/standing. Pt oxygen saturation levels are 92% when sitting in bed and decrease to 86% with standing.

## 2017-04-10 NOTE — Progress Notes (Addendum)
Pharmacy Antibiotic Note  [name] is a 84 y.o. female admitted on 04/10/2017 with pneumonia.  Pharmacy has been consulted for Vancomycin dosing.  Plan: Vancomycin 1500 mg IV OT bolus dose in ER Vancomycin 1250 IV every 24 hours.  Goal trough 15-20 mcg/mL.  Cefepime 1 gram IV every 24 hours Monitor labs and vanco trough when needed.  Height: 5' (152.4 cm) Weight: 216 lb (98 kg) IBW/kg (Calculated) : 45.5  Temp (24hrs), Avg:100.2 F (37.9 C), Min:99.7 F (37.6 C), Max:100.6 F (38.1 C)  Recent Labs  Lab 04/10/17 0557  WBC 17.2*  CREATININE 1.15*    Estimated Creatinine Clearance: 36.9 mL/min (A) (by C-G formula based on SCr of 1.15 mg/dL (H)).    Allergies  Allergen Reactions  . Other     Band Aids   . Tape   . Lipitor [Atorvastatin Calcium] Rash and Other (See Comments)    Muscle pain  . Niaspan [Niacin Er] Rash and Other (See Comments)    Muscle pain    Antimicrobials this admission: 1/28 Vanco >>  1/28 Cefepime >>   Dose adjustments this admission: Cefepime 1 gram IV Q k24 hours   Microbiology results: 1/28 Blood culture: pending MRSA PCR: in progress  Thank you for allowing pharmacy to be a part of this patient's care.  Margot Ables, PharmD Clinical Pharmacist 04/10/2017 8:23 AM

## 2017-04-10 NOTE — Care Management (Signed)
Pt active with [facility] prior to admission. CM will follow for DC planning needs.

## 2017-04-11 LAB — LEGIONELLA PNEUMOPHILA SEROGP 1 UR AG: L. PNEUMOPHILA SEROGP 1 UR AG: NEGATIVE

## 2017-04-11 LAB — CBC
HCT: 43.1 % (ref 36.0–46.0)
Hemoglobin: 12.9 g/dL (ref 12.0–15.0)
MCH: 26.8 pg (ref 26.0–34.0)
MCHC: 29.9 g/dL — AB (ref 30.0–36.0)
MCV: 89.4 fL (ref 78.0–100.0)
PLATELETS: 137 10*3/uL — AB (ref 150–400)
RBC: 4.82 MIL/uL (ref 3.87–5.11)
RDW: 16.6 % — AB (ref 11.5–15.5)
WBC: 14.6 10*3/uL — ABNORMAL HIGH (ref 4.0–10.5)

## 2017-04-11 LAB — BASIC METABOLIC PANEL
ANION GAP: 10 (ref 5–15)
BUN: 16 mg/dL (ref 6–20)
CALCIUM: 9.1 mg/dL (ref 8.9–10.3)
CO2: 26 mmol/L (ref 22–32)
Chloride: 100 mmol/L — ABNORMAL LOW (ref 101–111)
Creatinine, Ser: 1.06 mg/dL — ABNORMAL HIGH (ref 0.44–1.00)
GFR calc Af Amer: 54 mL/min — ABNORMAL LOW (ref >60)
GFR, EST NON AFRICAN AMERICAN: 46 mL/min — AB (ref >60)
GLUCOSE: 180 mg/dL — AB (ref 65–99)
Potassium: 3.8 mmol/L (ref 3.5–5.1)
Sodium: 136 mmol/L (ref 135–145)

## 2017-04-11 LAB — STREP PNEUMONIAE URINARY ANTIGEN: STREP PNEUMO URINARY ANTIGEN: NEGATIVE

## 2017-04-11 LAB — HIV ANTIBODY (ROUTINE TESTING W REFLEX): HIV Screen 4th Generation wRfx: NONREACTIVE

## 2017-04-11 MED ORDER — POLYETHYLENE GLYCOL 3350 17 G PO PACK: 17.0000 g | PACK | Freq: Every day | ORAL | Status: DC | PRN

## 2017-04-11 NOTE — Plan of Care (Signed)
  Acute Rehab PT Goals(only PT should resolve) Pt Will Go Supine/Side To Sit 04/11/2017 1346 - Progressing by Lonell Grandchild, PT Flowsheets Taken 04/11/2017 1346  Pt will go Supine/Side to Sit with supervision Patient Will Transfer Sit To/From Stand 04/11/2017 1346 - Progressing by Lonell Grandchild, PT Flowsheets Taken 04/11/2017 1346  Patient will transfer sit to/from stand with min guard assist Pt Will Transfer Bed To Chair/Chair To Bed 04/11/2017 1346 - Progressing by Lonell Grandchild, PT Flowsheets Taken 04/11/2017 1346  Pt will Transfer Bed to Chair/Chair to Bed min guard assist Pt Will Ambulate 04/11/2017 1346 - Progressing by Lonell Grandchild, PT Flowsheets Taken 04/11/2017 1346  Pt will Ambulate with minimal assist;15 feet;with rolling walker  1:47 PM, 04/11/17 Lonell Grandchild, MPT Physical Therapist with [facility] [phone] office [phone] mobile phone

## 2017-04-11 NOTE — Progress Notes (Signed)
PROGRESS NOTE    [name]  FKC:[id number] DOB: Oct 04, 1928 DOA: 04/10/2017 PCP: Celene Squibb, MD     Brief Narrative:  84 year old woman admitted from assisted living facility on 1/28 after a fall.  In the ED she was found to have pneumonia as well as an acute compression fracture.  Admission was requested.   Assessment & Plan:   Principal Problem:   HCAP (healthcare-associated pneumonia) Active Problems:   COPD (chronic obstructive pulmonary disease) (HCC)   GERD (gastroesophageal reflux disease)   Acute encephalopathy   Dementia   Acute on chronic respiratory failure with hypoxia (HCC)   CKD (chronic kidney disease), stage III (HCC)   Closed compression fracture of L1 lumbar vertebra ([place])   Healthcare associated pneumonia -Blood cultures remain negative. -Influenza PCR is negative. -Given negative MRSA vancomycin has been discontinued, is on cefepime.  Acute metabolic encephalopathy -Likely due to acute infectious process on top of baseline dementia. -Is much improved today.  Acute L1 compression fracture -Following fall, continue to treat symptomatically.  Chronic atrial fibrillation -Rate controlled, anticoagulated on Eliquis.  COPD -Compensated at present, continue Symbicort, albuterol.  Hypokalemia -Adequately replaced, mag within normal limits at 1.8   DVT prophylaxis: Eliquis Code Status: Full code Family Communication: Patient only  Disposition Plan: Will likely need SNF, SW aare  Consultants:   None  Procedures:   None  Antimicrobials:  Anti-infectives (From admission, onward)   Start     Dose/Rate Route Frequency Ordered Stop   04/11/17 1000  vancomycin (VANCOCIN) 1,250 mg in sodium chloride 0.9 % 250 mL IVPB  Status:  Discontinued     1,250 mg 166.7 mL/hr over 90 Minutes Intravenous Every 24 hours 04/10/17 0825 04/10/17 0853   04/11/17 0700  ceFEPIme (MAXIPIME) 1 g in dextrose 5 % 50 mL IVPB     1 g 100 mL/hr over 30 Minutes  Intravenous Every 24 hours 04/10/17 0842     04/10/17 1430  ceFEPIme (MAXIPIME) 1 g in dextrose 5 % 50 mL IVPB  Status:  Discontinued     1 g 100 mL/hr over 30 Minutes Intravenous Every 8 hours 04/10/17 0827 04/10/17 0842   04/10/17 0900  vancomycin (VANCOCIN) 1,500 mg in sodium chloride 0.9 % 500 mL IVPB  Status:  Discontinued     1,500 mg 250 mL/hr over 120 Minutes Intravenous  Once 04/10/17 0748 04/10/17 1119   04/10/17 0900  vancomycin (VANCOCIN) 1,500 mg in sodium chloride 0.9 % 500 mL IVPB  Status:  Discontinued     1,500 mg 250 mL/hr over 120 Minutes Intravenous  Once 04/10/17 0847 04/10/17 0852   04/10/17 0715  vancomycin (VANCOCIN) 1,250 mg in sodium chloride 0.9 % 250 mL IVPB  Status:  Discontinued     1,250 mg 166.7 mL/hr over 90 Minutes Intravenous  Once 04/10/17 0711 04/10/17 1130   04/10/17 0700  ceFEPIme (MAXIPIME) 1 g in dextrose 5 % 50 mL IVPB     1 g 100 mL/hr over 30 Minutes Intravenous  Once 04/10/17 0645 04/10/17 0808       Subjective: Lying in bed, no complaints, very hard of hearing  Objective: Vitals:   04/11/17 0753 04/11/17 0755 04/11/17 1138 04/11/17 1411  BP:    (!) 124/50  Pulse:    71  Resp:    20  Temp:   99.7 F (37.6 C) 99.5 F (37.5 C)  TempSrc:   Oral Oral  SpO2: 93% 93%  94%  Weight:  Height:        Intake/Output Summary (Last 24 hours) at 04/11/2017 1516 Last data filed at 04/11/2017 0900 Gross per 24 hour  Intake 1830 ml  Output 700 ml  Net 1130 ml   Filed Weights   04/10/17 0447 04/10/17 0900  Weight: 98 kg (216 lb) 92.7 kg (204 lb 5.9 oz)    Examination:  General exam: Alert, awake, oriented x 3, very hard of hearing Respiratory system: Clear to auscultation. Respiratory effort normal. Cardiovascular system:RRR. No murmurs, rubs, gallops. Gastrointestinal system: Abdomen is nondistended, soft and nontender. No organomegaly or masses felt. Normal bowel sounds heard. Central nervous system: Alert and oriented. No focal  neurological deficits. Extremities: No C/C/E, +pedal pulses Skin: No rashes, lesions or ulcers Psychiatry: Judgement and insight appear normal. Mood & affect appropriate.     Data Reviewed: I have personally reviewed following labs and imaging studies  CBC: Recent Labs  Lab 04/10/17 0557 04/11/17 0452  WBC 17.2* 14.6*  NEUTROABS 13.9*  --   HGB 12.6 12.9  HCT 41.2 43.1  MCV 87.8 89.4  PLT 138* 096*   Basic Metabolic Panel: Recent Labs  Lab 04/10/17 0557 04/10/17 0943 04/11/17 0452  NA 134*  --  136  K 3.1*  --  3.8  CL 95*  --  100*  CO2 27  --  26  GLUCOSE 171*  --  180*  BUN 23*  --  16  CREATININE 1.15*  --  1.06*  CALCIUM 9.3  --  9.1  MG  --  1.8  --    GFR: Estimated Creatinine Clearance: 40.4 mL/min (A) (by C-G formula based on SCr of 1.06 mg/dL (H)). Liver Function Tests: No results for input(s): AST, ALT, ALKPHOS, BILITOT, PROT, ALBUMIN in the last 168 hours. No results for input(s): LIPASE, AMYLASE in the last 168 hours. No results for input(s): AMMONIA in the last 168 hours. Coagulation Profile: No results for input(s): INR, PROTIME in the last 168 hours. Cardiac Enzymes: No results for input(s): CKTOTAL, CKMB, CKMBINDEX, TROPONINI in the last 168 hours. BNP (last 3 results) No results for input(s): PROBNP in the last 8760 hours. HbA1C: No results for input(s): HGBA1C in the last 72 hours. CBG: No results for input(s): GLUCAP in the last 168 hours. Lipid Profile: No results for input(s): CHOL, HDL, LDLCALC, TRIG, CHOLHDL, LDLDIRECT in the last 72 hours. Thyroid Function Tests: No results for input(s): TSH, T4TOTAL, FREET4, T3FREE, THYROIDAB in the last 72 hours. Anemia Panel: No results for input(s): VITAMINB12, FOLATE, FERRITIN, TIBC, IRON, RETICCTPCT in the last 72 hours. Urine analysis:    Component Value Date/Time   COLORURINE YELLOW 04/10/2017 [place] 04/10/2017 0514   LABSPEC 1.016 04/10/2017 0514   PHURINE 7.0  04/10/2017 0514   GLUCOSEU NEGATIVE 04/10/2017 0514   HGBUR NEGATIVE 04/10/2017 0514   BILIRUBINUR NEGATIVE 04/10/2017 0514   KETONESUR NEGATIVE 04/10/2017 0514   PROTEINUR 30 (A) 04/10/2017 0514   UROBILINOGEN 1.0 10/11/2014 1428   NITRITE NEGATIVE 04/10/2017 0514   LEUKOCYTESUR NEGATIVE 04/10/2017 0514   Sepsis Labs: @LABRCNTIP (procalcitonin:4,lacticidven:4)  ) Recent Results (from the past 240 hour(s))  MRSA PCR Screening     Status: None   Collection Time: 04/10/17  8:28 AM  Result Value Ref Range Status   MRSA by PCR NEGATIVE NEGATIVE Final    Comment:        The GeneXpert MRSA Assay (FDA approved for NASAL specimens only), is one component of a comprehensive MRSA colonization  surveillance program. It is not intended to diagnose MRSA infection nor to guide or monitor treatment for MRSA infections.   Culture, blood (routine x 2) Call MD if unable to obtain prior to antibiotics being given     Status: None (Preliminary result)   Collection Time: 04/10/17  9:43 AM  Result Value Ref Range Status   Specimen Description RIGHT ANTECUBITAL  Final   Special Requests   Final    BOTTLES DRAWN AEROBIC AND ANAEROBIC Blood Culture adequate volume   Culture NO GROWTH < 24 HOURS  Final   Report Status PENDING  Incomplete  Culture, blood (routine x 2) Call MD if unable to obtain prior to antibiotics being given     Status: None (Preliminary result)   Collection Time: 04/10/17  9:43 AM  Result Value Ref Range Status   Specimen Description BLOOD RIGHT ARM  Final   Special Requests   Final    BOTTLES DRAWN AEROBIC AND ANAEROBIC Blood Culture adequate volume   Culture NO GROWTH < 24 HOURS  Final   Report Status PENDING  Incomplete         Radiology Studies: Dg Chest 1 View  Result Date: 04/10/2017 CLINICAL DATA:  Patient fell out of bed.  Low back pain. EXAM: CHEST 1 VIEW COMPARISON:  03/31/2017 FINDINGS: Mild cardiac enlargement. No vascular congestion or edema. Interstitial  fibrosis in the lungs. There is interval development of superimposed airspace disease in the right mid lung likely representing developing pneumonia. Aspiration or contusion could also potentially have this appearance. Calcified and tortuous aorta. Degenerative changes in the shoulders. IMPRESSION: Cardiac enlargement. Interstitial fibrosis in the lungs. Developing airspace disease in the right mid lung likely represent pneumonia. Aspiration or contusion could also have this appearance. Electronically Signed   By: Lucienne Capers M.D.   On: 04/10/2017 06:41   Dg Lumbar Spine Complete  Result Date: 04/10/2017 CLINICAL DATA:  Patient fell out of bed.  Low back pain. EXAM: LUMBAR SPINE - COMPLETE 4+ VIEW COMPARISON:  10/25/2016 FINDINGS: Mild lumbar scoliosis convex towards the right. Degenerative changes throughout the lumbar spine with narrowed interspaces and endplate hypertrophic changes. Interval anterior compression of L1 since previous study. Given the history of trauma, this could represent acute compression fracture. No retropulsion of fracture fragments. Degenerative disc disease in the lower lumbar spine. Mild compression of L4 is unchanged. Vascular calcifications throughout the aorta. IMPRESSION: Anterior compression of L1, new since previous study and likely acute. This represents approximately 25% loss of height. Diffuse degenerative change throughout the lumbar spine. Aortic atherosclerosis. Electronically Signed   By: Lucienne Capers M.D.   On: 04/10/2017 06:43   Dg Pelvis 1-2 Views  Result Date: 04/10/2017 CLINICAL DATA:  Patient fell out of bed.  Low back pain. EXAM: PELVIS - 1-2 VIEW COMPARISON:  CT abdomen and pelvis 07/26/2016. FINDINGS: Degenerative changes in the lower lumbar spine and hips. No evidence of acute fracture or dislocation in the pelvis or hips. SI joints and symphysis pubis are not displaced. No focal bone lesion or bone destruction. IMPRESSION: Degenerative changes in  the lower lumbar spine and both hips. No acute bony abnormalities. Electronically Signed   By: Lucienne Capers M.D.   On: 04/10/2017 06:40   Ct Head Wo Contrast  Result Date: 04/10/2017 CLINICAL DATA:  84 year old female fell out of bed. Complaining of lower mid back pain. Does not recall hitting head. Headaches and bilateral neck stiffness. Initial encounter. EXAM: CT HEAD WITHOUT CONTRAST CT CERVICAL SPINE WITHOUT  CONTRAST TECHNIQUE: Multidetector CT imaging of the head and cervical spine was performed following the standard protocol without intravenous contrast. Multiplanar CT image reconstructions of the cervical spine were also generated. COMPARISON:  07/16/2016 CT head and cervical spine. FINDINGS: CT HEAD FINDINGS Brain: No intracranial hemorrhage or CT evidence of large acute infarct. Chronic microvascular changes. Focal fat anterior falx otherwise, no intracranial mass lesion noted on this unenhanced exam. Vascular: Ectatic basilar artery.  Calcified carotid arteries. Skull: No skull fracture Sinuses/Orbits: Chronic nonspecific soft tissue swelling inferior medial aspect of the left globe. Partial opacification left frontal sinus. Other: Mastoid air cells and middle ear cavities are clear. CT CERVICAL SPINE FINDINGS Alignment: Minimal curvature convex left. Minimal anterior slip C3 and C4 and minimal retrolisthesis C5 unchanged from prior exam. Skull base and vertebrae: Evaluation slightly limited by artifact from patient's shoulders/habitus. No acute cervical spine fracture noted. Soft tissues and spinal canal: No abnormal prevertebral soft tissue swelling. Disc levels:  Evaluation limited by habitus. Upper chest: Bilateral upper lobe airspace disease greater on right. Please see chest x-ray report from same date. Other: Left thyroid 1.3 centimeter nodule and right thyroid 6 millimeter nodule incidentally noted. Carotid bifurcation calcifications. IMPRESSION: No skull fracture or intracranial  hemorrhage. No cervical spine fracture or abnormal prevertebral soft tissue swelling. Alignment similar to prior exam. Evaluation slightly limited by patient's habitus/shoulder artifact. Bilateral upper lobe airspace disease greater on right. Please see chest x-ray report from same date. Electronically Signed   By: Genia Del M.D.   On: 04/10/2017 07:10   Ct Cervical Spine Wo Contrast  Result Date: 04/10/2017 CLINICAL DATA:  84 year old female fell out of bed. Complaining of lower mid back pain. Does not recall hitting head. Headaches and bilateral neck stiffness. Initial encounter. EXAM: CT HEAD WITHOUT CONTRAST CT CERVICAL SPINE WITHOUT CONTRAST TECHNIQUE: Multidetector CT imaging of the head and cervical spine was performed following the standard protocol without intravenous contrast. Multiplanar CT image reconstructions of the cervical spine were also generated. COMPARISON:  07/16/2016 CT head and cervical spine. FINDINGS: CT HEAD FINDINGS Brain: No intracranial hemorrhage or CT evidence of large acute infarct. Chronic microvascular changes. Focal fat anterior falx otherwise, no intracranial mass lesion noted on this unenhanced exam. Vascular: Ectatic basilar artery.  Calcified carotid arteries. Skull: No skull fracture Sinuses/Orbits: Chronic nonspecific soft tissue swelling inferior medial aspect of the left globe. Partial opacification left frontal sinus. Other: Mastoid air cells and middle ear cavities are clear. CT CERVICAL SPINE FINDINGS Alignment: Minimal curvature convex left. Minimal anterior slip C3 and C4 and minimal retrolisthesis C5 unchanged from prior exam. Skull base and vertebrae: Evaluation slightly limited by artifact from patient's shoulders/habitus. No acute cervical spine fracture noted. Soft tissues and spinal canal: No abnormal prevertebral soft tissue swelling. Disc levels:  Evaluation limited by habitus. Upper chest: Bilateral upper lobe airspace disease greater on right. Please  see chest x-ray report from same date. Other: Left thyroid 1.3 centimeter nodule and right thyroid 6 millimeter nodule incidentally noted. Carotid bifurcation calcifications. IMPRESSION: No skull fracture or intracranial hemorrhage. No cervical spine fracture or abnormal prevertebral soft tissue swelling. Alignment similar to prior exam. Evaluation slightly limited by patient's habitus/shoulder artifact. Bilateral upper lobe airspace disease greater on right. Please see chest x-ray report from same date. Electronically Signed   By: Genia Del M.D.   On: 04/10/2017 07:10        Scheduled Meds: . apixaban  2.5 mg Oral BID  . cholecalciferol  1,000 Units Oral  Daily  . diltiazem  300 mg Oral Daily  . donepezil  5 mg Oral QHS  . DULoxetine  60 mg Oral Daily  . fluticasone furoate-vilanterol  1 puff Inhalation Daily   Or  . umeclidinium bromide  1 puff Inhalation Daily  . mometasone-formoterol  2 puff Inhalation BID  . multivitamin with minerals  1 tablet Oral Daily  . pantoprazole  40 mg Oral BID  . rosuvastatin  20 mg Oral Daily   Continuous Infusions: . sodium chloride 75 mL/hr at 04/10/17 2150  . ceFEPime (MAXIPIME) IV 1 g (04/11/17 0629)     LOS: 1 day    Time spent: 25 minutes. Greater than 50% of this time was spent in direct contact with the patient coordinating care.     Lelon Frohlich, MD [facility] Pager [phone]  If 7PM-7AM, please contact night-coverage [email or web address] Password [facility] 04/11/2017, 3:16 PM

## 2017-04-11 NOTE — Evaluation (Signed)
Physical Therapy Evaluation Patient Details Name: [name] MRN: [id number] DOB: 11-Nov-1928 Today's Date: 04/11/2017   History of Present Illness  [name] is a 84 y.o. female who was brought in today from her assisted living facility after suffering a fall this morning around 4 AM while she was transferring from her wheelchair to bed.  Most of history is obtained by her daughter at bedside as she is quite lethargic.  Patient was hospitalized last week for COPD exacerbation discharged home on prednisone taper and nebs.  She has become progressively weaker over the past week.  She does have a history significant for dementia, COPD, A. fib chronically anticoagulated on Eliquis, hypertension.  Of note in 2010 she had an episode of pneumonia that culminated in septic shock requiring transfer to [facility] and a stay in the ICU.  In the ED she was noted to be afebrile, blood pressure is within normal limits with a systolic of around 212-248, she is hypoxic with an O2 sat of around 84-89% on room air labs are significant for creatinine of 1.15, WBC count of 17.2.,  Potassium of 3.1.  Chest x-ray shows developing airspace disease in the right midlung representing pneumonia.Because of her fall and the fact that she is anticoagulated a CT scan of the head and C-spine was ordered without significant abnormalities, lumbar spine x-ray does show an L1 compression fracture that is acute.  Admission has been requested.    Clinical Impression  Patient required encouragement and limited for out of bed activity and taking steps due to generalized  Weakness, fatigue and mild SOB.  Patient stood and able to complete a few side steps before having to sit and declined to sit up in a chair due to fatigue.  Patient will benefit from continued physical therapy in hospital and recommended venue below to increase strength, balance, endurance for safe ADLs and gait.    Follow Up Recommendations SNF    Equipment  Recommendations  None recommended by PT    Recommendations for Other Services       Precautions / Restrictions Precautions Precautions: Fall Restrictions Weight Bearing Restrictions: No      Mobility  Bed Mobility Overal bed mobility: Needs Assistance Bed Mobility: Supine to Sit;Sit to Supine     Supine to sit: Min assist Sit to supine: Min assist      Transfers Overall transfer level: Needs assistance Equipment used: Rolling walker (2 wheeled) Transfers: Sit to/from Stand Sit to Stand: Mod assist;Min assist            Ambulation/Gait Ambulation/Gait assistance: Mod assist Ambulation Distance (Feet): 3 Feet Assistive device: Rolling walker (2 wheeled) Gait Pattern/deviations: Decreased step length - right;Decreased step length - left;Decreased stride length   Gait velocity interpretation: Below normal speed for age/gender General Gait Details: limited to 4-5 side steps at bedside demonstrating unsteady labored movement and c/o fatigue  Stairs            Wheelchair Mobility    Modified Rankin (Stroke Patients Only)       Balance Overall balance assessment: Needs assistance Sitting-balance support: No upper extremity supported;Feet supported Sitting balance-Leahy Scale: Fair     Standing balance support: Bilateral upper extremity supported;During functional activity Standing balance-Leahy Scale: Poor Standing balance comment: fair/poor using RW                             Pertinent Vitals/Pain Pain Assessment: Faces Faces  Pain Scale: Hurts even more Pain Location: BLE with pressure Pain Descriptors / Indicators: Pressure;Sharp Pain Intervention(s): Limited activity within patient's tolerance;Monitored during session    Home Living Family/patient expects to be discharged to:: Assisted living               Home Equipment: Bedside commode;Shower seat;Hospital bed;Walker - 4 wheels;Other (comment);Wheelchair - manual       Prior Function Level of Independence: Needs assistance   Gait / Transfers Assistance Needed: limited to a few steps for stand pivot transfers with facility staff assisting  ADL's / Homemaking Assistance Needed: assisted by facility staff        Hand Dominance        Extremity/Trunk Assessment   Upper Extremity Assessment Upper Extremity Assessment: Generalized weakness    Lower Extremity Assessment Lower Extremity Assessment: Generalized weakness    Cervical / Trunk Assessment Cervical / Trunk Assessment: Normal  Communication   Communication: HOH  Cognition Arousal/Alertness: Awake/alert Behavior During Therapy: WFL for tasks assessed/performed Overall Cognitive Status: Within Functional Limits for tasks assessed                                        General Comments      Exercises     Assessment/Plan    PT Assessment Patient needs continued PT services  PT Problem List Decreased strength;Decreased activity tolerance;Decreased balance;Decreased mobility;Pain;Cardiopulmonary status limiting activity       PT Treatment Interventions Gait training;Functional mobility training;Therapeutic activities;Therapeutic exercise;Wheelchair mobility training;Patient/family education    PT Goals (Current goals can be found in the Care Plan section)  Acute Rehab PT Goals Patient Stated Goal: return to ALF after rehab PT Goal Formulation: With patient Time For Goal Achievement: 04/25/17 Potential to Achieve Goals: Good    Frequency Min 3X/week   Barriers to discharge        Co-evaluation               AM-PAC PT "6 Clicks" Daily Activity  Outcome Measure Difficulty turning over in bed (including adjusting bedclothes, sheets and blankets)?: A Little Difficulty moving from lying on back to sitting on the side of the bed? : A Little Difficulty sitting down on and standing up from a chair with arms (e.g., wheelchair, bedside commode, etc,.)?: A  Lot Help needed moving to and from a bed to chair (including a wheelchair)?: A Lot Help needed walking in hospital room?: A Lot Help needed climbing 3-5 steps with a railing? : Total 6 Click Score: 13    End of Session   Activity Tolerance: Patient limited by fatigue;Patient limited by pain Patient left: in bed;with call bell/phone within reach Nurse Communication: Mobility status PT Visit Diagnosis: Other abnormalities of gait and mobility (R26.89);Muscle weakness (generalized) (M62.81);History of falling (Z91.81);Unsteadiness on feet (R26.81)    Time: 5462-7035 PT Time Calculation (min) (ACUTE ONLY): 22 min   Charges:   PT Evaluation $PT Eval Moderate Complexity: 1 Mod PT Treatments $Therapeutic Activity: 8-22 mins   PT G Codes:        1:44 PM, 04-12-17 Lonell Grandchild, MPT Physical Therapist with [facility] [phone] office [phone] mobile phone

## 2017-04-11 NOTE — [place] FL2 (Signed)
[place] MEDICAID FL2 LEVEL OF CARE SCREENING TOOL     IDENTIFICATION  Patient Name: [name] Birthdate: [birth year] Sex: female Admission Date (Current Location): 04/10/2017  [place] and [place] Number:  [name] [id number] [place] and Address:  [facility] [address], [place]      Provider Number: [id number]  Attending Physician Name and Address:  Isaac Bliss, [place]  Relative Name and Phone Number:  Catarina Hartshorn [phone]    Current Level of Care: Hospital Recommended Level of Care: [place] Prior Approval Number:    Date Approved/Denied:   PASRR Number: pending  Discharge Plan: SNF    Current Diagnoses: Patient Active Problem List   Diagnosis Date Noted  . HCAP (healthcare-associated pneumonia) 04/10/2017  . Closed compression fracture of L1 lumbar vertebra ([place]) 04/10/2017  . Acute on chronic respiratory failure with hypoxia ([place]) 04/01/2017  . CKD (chronic kidney disease), stage III ([place]) 04/01/2017  . COPD with acute exacerbation ([place])   . COPD exacerbation ([place]) 03/31/2017  . Depression 03/31/2017  . Dementia 03/31/2017  . Peripheral neuropathy 08/01/2016  . AKI (acute kidney injury) ([place]) 07/26/2016  . Atrial fibrillation ([place]) 07/26/2016  . Hypertension 07/26/2016  . Acute gastroenteritis 07/26/2016  . Altered mental status 07/26/2016  . Prolonged QT interval 07/26/2016  . Syncope 09/12/2014  . CKD (chronic kidney disease) 09/12/2014  . Hyperglycemia 09/12/2014  . Hypoxia 09/12/2014  . Acute encephalopathy 09/12/2014  . Chest pain 07/21/2013  . Chronic respiratory failure ([place]) 07/21/2013  . Hypokalemia 07/21/2013  . Atrial fibrillation with RVR ([place]) 07/21/2013  . Knee pain 05/21/2013  . Fibromyalgia   . COPD (chronic obstructive pulmonary disease) ([place])   . 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 GERD (gastroesophageal reflux disease)   . Rheumatoid arteritis   . Back  pain   . Fracture of distal fibula 09/20/2010    Orientation RESPIRATION BLADDER Height & Weight     Self, Place  O2 Continent Weight: 204 lb 5.9 oz (92.7 kg) Height:  5\' 2"  (157.5 cm)  BEHAVIORAL SYMPTOMS/MOOD NEUROLOGICAL BOWEL NUTRITION STATUS      Continent Diet(Heart Healthy)  AMBULATORY STATUS COMMUNICATION OF NEEDS Skin   Extensive Assist Verbally Normal                       Personal Care Assistance Level of Assistance  Bathing, Feeding, Dressing Bathing Assistance: Maximum assistance Feeding assistance: Independent Dressing Assistance: Maximum assistance     Functional Limitations Info  Sight, Hearing, Speech Sight Info: Adequate Hearing Info: Impaired Speech Info: Adequate    SPECIAL CARE FACTORS FREQUENCY  PT (By licensed PT)     PT Frequency: 5 times week              Contractures      Additional Factors Info  Code Status, Allergies, Psychotropic Code Status Info: full Allergies Info: tape, lipitor, Niaspan Psychotropic Info: Cymbalta, Ativan         Current Medications (04/11/2017):  This is the current hospital active medication list Current Facility-Administered Medications  Medication Dose Route Frequency Provider Last Rate Last Dose  . 0.9 %  sodium chloride infusion   Intravenous Continuous Isaac Bliss, Rayford Halsted, MD 75 mL/hr at 04/10/17 2150    . acetaminophen (TYLENOL) tablet 650 mg  650 mg Oral Q6H PRN Isaac Bliss, Rayford Halsted, MD   650 mg at 04/11/17 1138   Or  . acetaminophen (TYLENOL) suppository 650 mg  650 mg  Rectal Q6H PRN Isaac Bliss, Rayford Halsted, MD      . apixaban Arne [place]) tablet 2.5 mg  2.5 mg Oral BID Isaac Bliss, Rayford Halsted, MD   2.5 mg at 04/11/17 1016  . ceFEPIme (MAXIPIME) 1 g in dextrose 5 % 50 mL IVPB  1 g Intravenous Q24H Isaac Bliss, Rayford Halsted, MD 100 mL/hr at 04/11/17 0629 1 g at 04/11/17 0629  . cholecalciferol (VITAMIN D) tablet 1,000 Units  1,000 Units Oral Daily Isaac Bliss, Rayford Halsted, MD    1,000 Units at 04/11/17 1019  . diltiazem (CARDIZEM CD) 24 hr capsule 300 mg  300 mg Oral Daily Isaac Bliss, Rayford Halsted, MD   300 mg at 04/11/17 1018  . donepezil (ARICEPT) tablet 5 mg  5 mg Oral QHS Isaac Bliss, Rayford Halsted, MD   5 mg at 04/10/17 2149  . DULoxetine (CYMBALTA) DR capsule 60 mg  60 mg Oral Daily Isaac Bliss, Rayford Halsted, MD   60 mg at 04/11/17 1016  . fluticasone furoate-vilanterol (BREO ELLIPTA) 100-25 MCG/INH 1 puff  1 puff Inhalation Daily Isaac Bliss, Rayford Halsted, MD       Or  . umeclidinium bromide (INCRUSE ELLIPTA) 62.5 MCG/INH 1 puff  1 puff Inhalation Daily Isaac Bliss, Rayford Halsted, MD   1 puff at 04/11/17 0755  . mometasone-formoterol (DULERA) 100-5 MCG/ACT inhaler 2 puff  2 puff Inhalation BID Isaac Bliss, Rayford Halsted, MD   2 puff at 04/11/17 0753  . multivitamin with minerals tablet 1 tablet  1 tablet Oral Daily Isaac Bliss, Rayford Halsted, MD   1 tablet at 04/11/17 1019  . ondansetron (ZOFRAN) tablet 4 mg  4 mg Oral Q6H PRN Isaac Bliss, Rayford Halsted, MD       Or  . ondansetron [facility]) injection 4 mg  4 mg Intravenous Q6H PRN Isaac Bliss, Rayford Halsted, MD      . pantoprazole (PROTONIX) EC tablet 40 mg  40 mg Oral BID Isaac Bliss, Rayford Halsted, MD   40 mg at 04/11/17 1016  . polyethylene glycol (MIRALAX / GLYCOLAX) packet 17 g  17 g Oral Daily PRN Isaac Bliss, Rayford Halsted, MD      . rosuvastatin (CRESTOR) tablet 20 mg  20 mg Oral Daily Isaac Bliss, Rayford Halsted, MD   20 mg at 04/11/17 1016  . senna-docusate (Senokot-S) tablet 1 tablet  1 tablet Oral QHS PRN Isaac Bliss, Rayford Halsted, MD         Discharge Medications: Please see discharge summary for a list of discharge medications.  Relevant Imaging Results:  Relevant Lab Results:   Additional Information SSN [id number] [place]

## 2017-04-11 NOTE — Clinical Social Work Note (Signed)
LCSW following. Discussed pt's status with MD in progression today. PT evaluation ordered. Pt will likely need SNF rehab at dc. Spoke with pt's daughter, [name], by phone to discuss dc planning. [name] states that pt has been at the [facility] in the past and she would like a referral there. Referral started. Working on [employer]. Will continue to follow and assist with dc planning.

## 2017-04-12 DIAGNOSIS — J189 Pneumonia, unspecified organism: Principal | ICD-10-CM

## 2017-04-12 DIAGNOSIS — S32010D Wedge compression fracture of first lumbar vertebra, subsequent encounter for fracture with routine healing: Secondary | ICD-10-CM

## 2017-04-12 DIAGNOSIS — K219 Gastro-esophageal reflux disease without esophagitis: Secondary | ICD-10-CM

## 2017-04-12 DIAGNOSIS — J9621 Acute and chronic respiratory failure with hypoxia: Secondary | ICD-10-CM

## 2017-04-12 DIAGNOSIS — F0390 Unspecified dementia without behavioral disturbance: Secondary | ICD-10-CM

## 2017-04-12 DIAGNOSIS — G9340 Encephalopathy, unspecified: Secondary | ICD-10-CM

## 2017-04-12 DIAGNOSIS — N183 Chronic kidney disease, stage 3 (moderate): Secondary | ICD-10-CM

## 2017-04-12 LAB — CREATININE, SERUM
CREATININE: 1.09 mg/dL — AB (ref 0.44–1.00)
GFR, EST AFRICAN AMERICAN: 52 mL/min — AB (ref >60)
GFR, EST NON AFRICAN AMERICAN: 45 mL/min — AB (ref >60)

## 2017-04-12 MED ORDER — LINAGLIPTIN 5 MG PO TABS: 5.0000 mg | ORAL_TABLET | Freq: Every day | ORAL | Status: DC

## 2017-04-12 MED ORDER — DOXYCYCLINE HYCLATE 100 MG PO TABS
100.0000 mg | ORAL_TABLET | Freq: Two times a day (BID) | ORAL | Status: DC
  Administered 2017-04-12 – 2017-04-13 (×3): 100 mg via ORAL
  Filled 2017-04-12 (×3): qty 1

## 2017-04-12 MED ORDER — OXYCODONE-ACETAMINOPHEN 7.5-325 MG PO TABS: 1.0000 | ORAL_TABLET | Freq: Four times a day (QID) | ORAL | 0 refills | Status: DC | PRN

## 2017-04-12 MED ORDER — ACETAMINOPHEN 325 MG PO TABS
650.0000 mg | ORAL_TABLET | Freq: Once | ORAL | Status: AC
  Administered 2017-04-12: 650 mg via ORAL
  Filled 2017-04-12: qty 2

## 2017-04-12 MED ORDER — DOXYCYCLINE HYCLATE 100 MG PO TABS: 100.0000 mg | ORAL_TABLET | Freq: Two times a day (BID) | ORAL | 0 refills | Status: AC

## 2017-04-12 MED ORDER — LORAZEPAM 0.5 MG PO TABS: 0.2500 mg | ORAL_TABLET | Freq: Two times a day (BID) | ORAL | 0 refills | Status: DC | PRN

## 2017-04-12 MED ORDER — POLYETHYLENE GLYCOL 3350 17 G PO PACK
17.0000 g | PACK | Freq: Every day | ORAL | 0 refills | Status: DC | PRN
Start: 2017-04-12 — End: 2021-01-29

## 2017-04-12 MED ORDER — PANTOPRAZOLE SODIUM 40 MG PO TBEC: 40.0000 mg | DELAYED_RELEASE_TABLET | Freq: Every day | ORAL | Status: DC

## 2017-04-12 MED ORDER — BUDESONIDE-FORMOTEROL FUMARATE 80-4.5 MCG/ACT IN AERO: 2.0000 | INHALATION_SPRAY | Freq: Two times a day (BID) | RESPIRATORY_TRACT | 12 refills | Status: DC

## 2017-04-12 NOTE — Care Management Note (Signed)
Case Management Note  Patient Details  Name: [name] MRN: [id number] Date of Birth: 1928-12-14  Expected Discharge Date:  04/12/17               Expected Discharge Plan:  Skilled Nursing Facility  In-House Referral:  Clinical Social Work  Discharge planning Services  CM Consult  Post Acute Care Choice:  NA Choice offered to:  NA  Status of Service:  Completed, signed off  Additional Comments: I will DC to SNF today. CSW has made arrangements. CM has updated [facility] rep, Katrina, of pt's DC plan.   Sherald Barge, RN 04/12/2017, 10:41 AM

## 2017-04-12 NOTE — Clinical Social Work Placement (Addendum)
   CLINICAL SOCIAL WORK PLACEMENT  NOTE  Date:  04/12/2017  Patient Details  Name: [name] MRN: [id number] Date of Birth: 1928/09/10  Clinical Social Work is seeking post-discharge placement for this patient at the [place] level of care (*CSW will initial, date and re-position this form in  chart as items are completed):  Yes   Patient/family provided with [place] Work Department's list of facilities offering this level of care within the geographic area requested by the patient (or if unable, by the patient's family).  Yes   Patient/family informed of their freedom to choose among providers that offer the needed level of care, that participate in Medicare, Medicaid or managed care program needed by the patient, have an available bed and are willing to accept the patient.  Yes   Patient/family informed of [facility]'s ownership interest in [facility] and [facility], as well as of the fact that they are under no obligation to receive care at these facilities.  PASRR submitted to EDS on 04/11/17     PASRR number received on 04/12/17     Existing PASRR number confirmed on       FL2 transmitted to all facilities in geographic area requested by pt/family on 04/11/17     FL2 transmitted to all facilities within larger geographic area on       Patient informed that his/her managed care company has contracts with or will negotiate with certain facilities, including the following:        Yes   Patient/family informed of bed offers received.  Patient chooses bed at [facility]     Physician recommends and patient chooses bed at      Patient to be transferred to [facility] on 04/12/17.  Patient to be transferred to facility by [facility] staff     Patient family notified on 04/12/17 of transfer.  Name of family member notified:  Catarina Hartshorn, daughter     PHYSICIAN       Additional Comment:  Discharge clinicals  Sent and facility notified.   Pt's dc was cancelled 1/30. Pt stable for dc to [facility] today. Updated clinicals sent to [facility]. Updated Pt's dtr and RN.   _______________________________________________ Ihor Gully, LCSW 04/12/2017, 2:17 PM

## 2017-04-12 NOTE — Care Management Important Message (Signed)
Important Message  Patient Details  Name: KYNADEE DAM MRN: [id number] Date of Birth: 1928-05-25   Medicare Important Message Given:  Yes    Sherald Barge, RN 04/12/2017, 10:33 AM

## 2017-04-12 NOTE — Discharge Summary (Addendum)
Physician Discharge Summary  LIBBI TOWNER LYY:[id number] DOB: June 17, 1928 DOA: 04/10/2017  PCP: Celene Squibb, MD  Admit date: 04/10/2017 Discharge date: 04/13/2017  Disposition:  [place] Recommendations for Outpatient Follow-up:  1. Follow up with PCP in 1 weeks 2. Please obtain BMP/CBC in one week 3. Please check A1c in 3 months. 4. Please complete doxycycline in 5 days.  5. Please check blood sugar every morning.  6. Please assess and adjust frequency of nebulizer treatments as needed.  7. Please follow up on the following pending results: Final Culture Data  Discharge Condition: STABLE   CODE STATUS: Full    Brief Hospitalization Summary: Please see all hospital notes, images, labs for full details of the hospitalization.  HPI: ETRULIA ZARR is a 84 y.o. female who was brought in today from her assisted living facility after suffering a fall this morning around 4 AM while she was transferring from her wheelchair to bed.  Most of history is obtained by her daughter at bedside as she is quite lethargic.  Patient was hospitalized last week for COPD exacerbation discharged home on prednisone taper and nebs.  She has become progressively weaker over the past week.  She does have a history significant for dementia, COPD, A. fib chronically anticoagulated on Eliquis, hypertension.  Of note in 2010 she had an episode of pneumonia that culminated in septic shock requiring transfer to [facility] and a stay in the ICU.  In the ED she was noted to be afebrile, blood pressure is within normal limits with a systolic of around 127-517, she is hypoxic with an O2 sat of around 84-89% on room air labs are significant for creatinine of 1.15, WBC count of 17.2.,  Potassium of 3.1.  Chest x-ray shows developing airspace disease in the right midlung representing pneumonia.Because of her fall and the fact that she is anticoagulated a CT scan of the head and C-spine was ordered without significant  abnormalities, lumbar spine x-ray does show an L1 compression fracture that is acute.  Healthcare associated pneumonia -Blood cultures remain negative. -Influenza PCR is negative. -Complete course of doxycycline x 5 more days.   Acute metabolic encephalopathy -Likely due to acute infectious process on top of baseline dementia. -Resolved now.    Acute L1 compression fracture -Following fall, continue to treat symptomatically.  SNF placement pending.   Chronic atrial fibrillation -Rate controlled, anticoagulated on Eliquis.  COPD -Compensated at present, continue home respiratory medications Symbicort, albuterol.  Hypokalemia -Replaced, mag within normal limits at 1.8  Type 2 DM with A1c 7.6% - Rx Tradjenta 5 mg daily.  Check BS at least once daily.      DVT prophylaxis: Eliquis Code Status: Full code Disposition Plan: SNF  Discharge Diagnoses:  Principal Problem:   HCAP (healthcare-associated pneumonia) Active Problems:   COPD (chronic obstructive pulmonary disease) ([place])   GERD (gastroesophageal reflux disease)   Acute encephalopathy   Dementia   Acute on chronic respiratory failure with hypoxia (HCC)   CKD (chronic kidney disease), stage III (HCC)   Closed compression fracture of L1 lumbar vertebra [facility])  Discharge Instructions: Discharge Instructions    Increase activity slowly   Complete by:  As directed      Allergies as of 04/13/2017      Reactions   Other    Band Aids    Tape    Lipitor [atorvastatin Calcium] Rash, Other (See Comments)   Muscle pain   Niaspan [niacin Er] Rash, Other (See Comments)  Muscle pain      Medication List    STOP taking these medications   nystatin 100000 UNIT/ML suspension Commonly known as:  MYCOSTATIN   predniSONE 10 MG tablet Commonly known as:  DELTASONE   predniSONE 2.5 MG tablet Commonly known as:  DELTASONE     TAKE these medications   ACIDOPHILUS/PECTIN PO Take 1 capsule by mouth daily.    budesonide-formoterol 80-4.5 MCG/ACT inhaler Commonly known as:  SYMBICORT Inhale 2 puffs into the lungs 2 (two) times daily. What changed:  when to take this   CARTIA XT 300 MG 24 hr capsule Generic drug:  diltiazem TAKE ONE CAPSULE BY MOUTH EVERY DAY   DAILY VITE PO Take 1 tablet by mouth daily.   donepezil 5 MG tablet Commonly known as:  ARICEPT Take 5 mg by mouth at bedtime.   doxycycline 100 MG tablet Commonly known as:  VIBRA-TABS Take 1 tablet (100 mg total) by mouth every 12 (twelve) hours for 5 days.   DULoxetine 60 MG capsule Commonly known as:  CYMBALTA Take 60 mg by mouth daily.   ELIQUIS 2.5 MG Tabs tablet Generic drug:  apixaban Take 2.5 mg by mouth 2 (two) times daily.   guaifenesin 100 MG/5ML syrup Commonly known as:  ROBITUSSIN Take 200 mg by mouth 4 (four) times daily as needed for cough.   ipratropium-albuterol 0.5-2.5 (3) MG/3ML Soln Commonly known as:  DUONEB Take 3 mLs by nebulization every 6 (six) hours.   linagliptin 5 MG Tabs tablet Commonly known as:  TRADJENTA Take 1 tablet (5 mg total) by mouth daily.   LORazepam 0.5 MG tablet Commonly known as:  ATIVAN Take 0.5 tablets (0.25 mg total) by mouth 2 (two) times daily as needed for anxiety. What changed:    when to take this  reasons to take this   LYRICA 100 MG capsule Generic drug:  pregabalin Take 100 mg by mouth 3 (three) times daily.   methotrexate 2.5 MG tablet Take 12.5 mg by mouth. On Thursday at noon.   oxyCODONE-acetaminophen 7.5-325 MG tablet Commonly known as:  PERCOCET Take 1 tablet by mouth every 6 (six) hours as needed for severe pain. What changed:    how much to take  how to take this  when to take this  reasons to take this  additional instructions   pantoprazole 40 MG tablet Commonly known as:  PROTONIX Take 1 tablet (40 mg total) by mouth daily. What changed:  when to take this   phenylephrine-shark liver oil-mineral oil-petrolatum 0.25-3-14-71.9  % rectal ointment Commonly known as:  PREPARATION H Place 1 application rectally 2 (two) times daily as needed for hemorrhoids.   polyethylene glycol packet Commonly known as:  MIRALAX / GLYCOLAX Take 17 g by mouth daily as needed for moderate constipation. What changed:  reasons to take this   PROAIR HFA 108 (90 Base) MCG/ACT inhaler Generic drug:  albuterol Inhale 2 puffs into the lungs every 6 (six) hours as needed for wheezing or shortness of breath. For rescue   raloxifene 60 MG tablet Commonly known as:  EVISTA Take 60 mg by mouth daily.   rosuvastatin 20 MG tablet Commonly known as:  CRESTOR Take 20 mg by mouth daily.       Contact information for follow-up providers    Celene Squibb, MD. Schedule an appointment as soon as possible for a visit in 2 week(s).   Specialty:  Internal Medicine Why:  HOSPITAL FOLLOW UP  Contact information: [place]  [address] [place] [address] [phone]            Contact information for after-discharge care    [facility] Follow up.   Service:  Skilled Nursing Contact information: [address] [place] [address] [phone]                 Allergies  Allergen Reactions  . Other     Band Aids   . Tape   . Lipitor [Atorvastatin Calcium] Rash and Other (See Comments)    Muscle pain  . Niaspan [Niacin Er] Rash and Other (See Comments)    Muscle pain   Allergies as of 04/13/2017      Reactions   Other    Band Aids    Tape    Lipitor [atorvastatin Calcium] Rash, Other (See Comments)   Muscle pain   Niaspan [niacin Er] Rash, Other (See Comments)   Muscle pain      Medication List    STOP taking these medications   nystatin 100000 UNIT/ML suspension Commonly known as:  MYCOSTATIN   predniSONE 10 MG tablet Commonly known as:  DELTASONE   predniSONE 2.5 MG tablet Commonly known as:  DELTASONE     TAKE these medications   ACIDOPHILUS/PECTIN  PO Take 1 capsule by mouth daily.   budesonide-formoterol 80-4.5 MCG/ACT inhaler Commonly known as:  SYMBICORT Inhale 2 puffs into the lungs 2 (two) times daily. What changed:  when to take this   CARTIA XT 300 MG 24 hr capsule Generic drug:  diltiazem TAKE ONE CAPSULE BY MOUTH EVERY DAY   DAILY VITE PO Take 1 tablet by mouth daily.   donepezil 5 MG tablet Commonly known as:  ARICEPT Take 5 mg by mouth at bedtime.   doxycycline 100 MG tablet Commonly known as:  VIBRA-TABS Take 1 tablet (100 mg total) by mouth every 12 (twelve) hours for 5 days.   DULoxetine 60 MG capsule Commonly known as:  CYMBALTA Take 60 mg by mouth daily.   ELIQUIS 2.5 MG Tabs tablet Generic drug:  apixaban Take 2.5 mg by mouth 2 (two) times daily.   guaifenesin 100 MG/5ML syrup Commonly known as:  ROBITUSSIN Take 200 mg by mouth 4 (four) times daily as needed for cough.   ipratropium-albuterol 0.5-2.5 (3) MG/3ML Soln Commonly known as:  DUONEB Take 3 mLs by nebulization every 6 (six) hours.   linagliptin 5 MG Tabs tablet Commonly known as:  TRADJENTA Take 1 tablet (5 mg total) by mouth daily.   LORazepam 0.5 MG tablet Commonly known as:  ATIVAN Take 0.5 tablets (0.25 mg total) by mouth 2 (two) times daily as needed for anxiety. What changed:    when to take this  reasons to take this   LYRICA 100 MG capsule Generic drug:  pregabalin Take 100 mg by mouth 3 (three) times daily.   methotrexate 2.5 MG tablet Take 12.5 mg by mouth. On Thursday at noon.   oxyCODONE-acetaminophen 7.5-325 MG tablet Commonly known as:  PERCOCET Take 1 tablet by mouth every 6 (six) hours as needed for severe pain. What changed:    how much to take  how to take this  when to take this  reasons to take this  additional instructions   pantoprazole 40 MG tablet Commonly known as:  PROTONIX Take 1 tablet (40 mg total) by mouth daily. What changed:  when to take this   phenylephrine-shark liver  oil-mineral oil-petrolatum  0.25-3-14-71.9 % rectal ointment Commonly known as:  PREPARATION H Place 1 application rectally 2 (two) times daily as needed for hemorrhoids.   polyethylene glycol packet Commonly known as:  MIRALAX / GLYCOLAX Take 17 g by mouth daily as needed for moderate constipation. What changed:  reasons to take this   PROAIR HFA 108 (90 Base) MCG/ACT inhaler Generic drug:  albuterol Inhale 2 puffs into the lungs every 6 (six) hours as needed for wheezing or shortness of breath. For rescue   raloxifene 60 MG tablet Commonly known as:  EVISTA Take 60 mg by mouth daily.   rosuvastatin 20 MG tablet Commonly known as:  CRESTOR Take 20 mg by mouth daily.       Procedures/Studies: Dg Chest 1 View  Result Date: 04/10/2017 CLINICAL DATA:  Patient fell out of bed.  Low back pain. EXAM: CHEST 1 VIEW COMPARISON:  03/31/2017 FINDINGS: Mild cardiac enlargement. No vascular congestion or edema. Interstitial fibrosis in the lungs. There is interval development of superimposed airspace disease in the right mid lung likely representing developing pneumonia. Aspiration or contusion could also potentially have this appearance. Calcified and tortuous aorta. Degenerative changes in the shoulders. IMPRESSION: Cardiac enlargement. Interstitial fibrosis in the lungs. Developing airspace disease in the right mid lung likely represent pneumonia. Aspiration or contusion could also have this appearance. Electronically Signed   By: Lucienne Capers M.D.   On: 04/10/2017 06:41   Dg Chest 2 View  Result Date: 03/31/2017 CLINICAL DATA:  Cough. EXAM: CHEST  2 VIEW COMPARISON:  07/26/2016 and prior radiographs FINDINGS: Cardiomegaly again noted. Bibasilar opacities are present-question atelectasis versus pneumonia. No pleural effusion or pneumothorax. No acute bony abnormalities are noted. IMPRESSION: Bibasilar opacities-question atelectasis versus pneumonia. Cardiomegaly. Electronically Signed   By:  Margarette Canada M.D.   On: 03/31/2017 19:34   Dg Lumbar Spine Complete  Result Date: 04/10/2017 CLINICAL DATA:  Patient fell out of bed.  Low back pain. EXAM: LUMBAR SPINE - COMPLETE 4+ VIEW COMPARISON:  10/25/2016 FINDINGS: Mild lumbar scoliosis convex towards the right. Degenerative changes throughout the lumbar spine with narrowed interspaces and endplate hypertrophic changes. Interval anterior compression of L1 since previous study. Given the history of trauma, this could represent acute compression fracture. No retropulsion of fracture fragments. Degenerative disc disease in the lower lumbar spine. Mild compression of L4 is unchanged. Vascular calcifications throughout the aorta. IMPRESSION: Anterior compression of L1, new since previous study and likely acute. This represents approximately 25% loss of height. Diffuse degenerative change throughout the lumbar spine. Aortic atherosclerosis. Electronically Signed   By: Lucienne Capers M.D.   On: 04/10/2017 06:43   Dg Pelvis 1-2 Views  Result Date: 04/10/2017 CLINICAL DATA:  Patient fell out of bed.  Low back pain. EXAM: PELVIS - 1-2 VIEW COMPARISON:  CT abdomen and pelvis 07/26/2016. FINDINGS: Degenerative changes in the lower lumbar spine and hips. No evidence of acute fracture or dislocation in the pelvis or hips. SI joints and symphysis pubis are not displaced. No focal bone lesion or bone destruction. IMPRESSION: Degenerative changes in the lower lumbar spine and both hips. No acute bony abnormalities. Electronically Signed   By: Lucienne Capers M.D.   On: 04/10/2017 06:40   Ct Head Wo Contrast  Result Date: 04/10/2017 CLINICAL DATA:  84 year old female fell out of bed. Complaining of lower mid back pain. Does not recall hitting head. Headaches and bilateral neck stiffness. Initial encounter. EXAM: CT HEAD WITHOUT CONTRAST CT CERVICAL SPINE WITHOUT CONTRAST TECHNIQUE: Multidetector CT  imaging of the head and cervical spine was performed following  the standard protocol without intravenous contrast. Multiplanar CT image reconstructions of the cervical spine were also generated. COMPARISON:  07/16/2016 CT head and cervical spine. FINDINGS: CT HEAD FINDINGS Brain: No intracranial hemorrhage or CT evidence of large acute infarct. Chronic microvascular changes. Focal fat anterior falx otherwise, no intracranial mass lesion noted on this unenhanced exam. Vascular: Ectatic basilar artery.  Calcified carotid arteries. Skull: No skull fracture Sinuses/Orbits: Chronic nonspecific soft tissue swelling inferior medial aspect of the left globe. Partial opacification left frontal sinus. Other: Mastoid air cells and middle ear cavities are clear. CT CERVICAL SPINE FINDINGS Alignment: Minimal curvature convex left. Minimal anterior slip C3 and C4 and minimal retrolisthesis C5 unchanged from prior exam. Skull base and vertebrae: Evaluation slightly limited by artifact from patient's shoulders/habitus. No acute cervical spine fracture noted. Soft tissues and spinal canal: No abnormal prevertebral soft tissue swelling. Disc levels:  Evaluation limited by habitus. Upper chest: Bilateral upper lobe airspace disease greater on right. Please see chest x-ray report from same date. Other: Left thyroid 1.3 centimeter nodule and right thyroid 6 millimeter nodule incidentally noted. Carotid bifurcation calcifications. IMPRESSION: No skull fracture or intracranial hemorrhage. No cervical spine fracture or abnormal prevertebral soft tissue swelling. Alignment similar to prior exam. Evaluation slightly limited by patient's habitus/shoulder artifact. Bilateral upper lobe airspace disease greater on right. Please see chest x-ray report from same date. Electronically Signed   By: Genia Del M.D.   On: 04/10/2017 07:10   Ct Cervical Spine Wo Contrast  Result Date: 04/10/2017 CLINICAL DATA:  84 year old female fell out of bed. Complaining of lower mid back pain. Does not recall hitting  head. Headaches and bilateral neck stiffness. Initial encounter. EXAM: CT HEAD WITHOUT CONTRAST CT CERVICAL SPINE WITHOUT CONTRAST TECHNIQUE: Multidetector CT imaging of the head and cervical spine was performed following the standard protocol without intravenous contrast. Multiplanar CT image reconstructions of the cervical spine were also generated. COMPARISON:  07/16/2016 CT head and cervical spine. FINDINGS: CT HEAD FINDINGS Brain: No intracranial hemorrhage or CT evidence of large acute infarct. Chronic microvascular changes. Focal fat anterior falx otherwise, no intracranial mass lesion noted on this unenhanced exam. Vascular: Ectatic basilar artery.  Calcified carotid arteries. Skull: No skull fracture Sinuses/Orbits: Chronic nonspecific soft tissue swelling inferior medial aspect of the left globe. Partial opacification left frontal sinus. Other: Mastoid air cells and middle ear cavities are clear. CT CERVICAL SPINE FINDINGS Alignment: Minimal curvature convex left. Minimal anterior slip C3 and C4 and minimal retrolisthesis C5 unchanged from prior exam. Skull base and vertebrae: Evaluation slightly limited by artifact from patient's shoulders/habitus. No acute cervical spine fracture noted. Soft tissues and spinal canal: No abnormal prevertebral soft tissue swelling. Disc levels:  Evaluation limited by habitus. Upper chest: Bilateral upper lobe airspace disease greater on right. Please see chest x-ray report from same date. Other: Left thyroid 1.3 centimeter nodule and right thyroid 6 millimeter nodule incidentally noted. Carotid bifurcation calcifications. IMPRESSION: No skull fracture or intracranial hemorrhage. No cervical spine fracture or abnormal prevertebral soft tissue swelling. Alignment similar to prior exam. Evaluation slightly limited by patient's habitus/shoulder artifact. Bilateral upper lobe airspace disease greater on right. Please see chest x-ray report from same date. Electronically Signed    By: Genia Del M.D.   On: 04/10/2017 07:10     Subjective: Pt appears comfortable, in no distress, no current complaints.    Discharge Exam: Vitals:   04/13/17 4332 04/13/17 9518  BP: 127/62   Pulse: 85   Resp: 14   Temp: 99 F (37.2 C)   SpO2: 94% 95%   Vitals:   04/12/17 2103 04/12/17 2107 04/13/17 0445 04/13/17 0843  BP: (!) 149/72  127/62   Pulse: 77  85   Resp: 20  14   Temp: 99.8 F (37.7 C)  99 F (37.2 C)   TempSrc: Oral  Oral   SpO2: 94% 92% 94% 95%  Weight:      Height:       General: Pt is alert, awake, not in acute distress. Demented.  Cardiovascular: normal S1/S2 +, no rubs, no gallops Respiratory: CTA bilaterally, no wheezing, no rhonchi Abdominal: Soft, NT, ND, bowel sounds + Extremities: no edema, no cyanosis   The results of significant diagnostics from this hospitalization (including imaging, microbiology, ancillary and laboratory) are listed below for reference.     Microbiology: Recent Results (from the past 240 hour(s))  MRSA PCR Screening     Status: None   Collection Time: 04/10/17  8:28 AM  Result Value Ref Range Status   MRSA by PCR NEGATIVE NEGATIVE Final    Comment:        The GeneXpert MRSA Assay (FDA approved for NASAL specimens only), is one component of a comprehensive MRSA colonization surveillance program. It is not intended to diagnose MRSA infection nor to guide or monitor treatment for MRSA infections.   Culture, blood (routine x 2) Call MD if unable to obtain prior to antibiotics being given     Status: None (Preliminary result)   Collection Time: 04/10/17  9:43 AM  Result Value Ref Range Status   Specimen Description RIGHT ANTECUBITAL  Final   Special Requests   Final    BOTTLES DRAWN AEROBIC AND ANAEROBIC Blood Culture adequate volume   Culture NO GROWTH 3 DAYS  Final   Report Status PENDING  Incomplete  Culture, blood (routine x 2) Call MD if unable to obtain prior to antibiotics being given     Status: None  (Preliminary result)   Collection Time: 04/10/17  9:43 AM  Result Value Ref Range Status   Specimen Description BLOOD RIGHT ARM  Final   Special Requests   Final    BOTTLES DRAWN AEROBIC AND ANAEROBIC Blood Culture adequate volume   Culture NO GROWTH 3 DAYS  Final   Report Status PENDING  Incomplete     Labs: BNP (last 3 results) Recent Labs    03/31/17 1852  BNP 297.9*   Basic Metabolic Panel: Recent Labs  Lab 04/10/17 0557 04/10/17 0943 04/11/17 0452 04/12/17 0448  NA 134*  --  136  --   K 3.1*  --  3.8  --   CL 95*  --  100*  --   CO2 27  --  26  --   GLUCOSE 171*  --  180*  --   BUN 23*  --  16  --   CREATININE 1.15*  --  1.06* 1.09*  CALCIUM 9.3  --  9.1  --   MG  --  1.8  --   --    Liver Function Tests: No results for input(s): AST, ALT, ALKPHOS, BILITOT, PROT, ALBUMIN in the last 168 hours. No results for input(s): LIPASE, AMYLASE in the last 168 hours. No results for input(s): AMMONIA in the last 168 hours. CBC: Recent Labs  Lab 04/10/17 0557 04/11/17 0452  WBC 17.2* 14.6*  NEUTROABS 13.9*  --   HGB 12.6 12.9  HCT 41.2 43.1  MCV 87.8 89.4  PLT 138* 137*   Cardiac Enzymes: No results for input(s): CKTOTAL, CKMB, CKMBINDEX, TROPONINI in the last 168 hours. BNP: Invalid input(s): POCBNP CBG: No results for input(s): GLUCAP in the last 168 hours. D-Dimer No results for input(s): DDIMER in the last 72 hours. Hgb A1c No results for input(s): HGBA1C in the last 72 hours. Lipid Profile No results for input(s): CHOL, HDL, LDLCALC, TRIG, CHOLHDL, LDLDIRECT in the last 72 hours. Thyroid function studies No results for input(s): TSH, T4TOTAL, T3FREE, THYROIDAB in the last 72 hours.  Invalid input(s): FREET3 Anemia work up No results for input(s): VITAMINB12, FOLATE, FERRITIN, TIBC, IRON, RETICCTPCT in the last 72 hours. Urinalysis    Component Value Date/Time   COLORURINE YELLOW 04/10/2017 [place] 04/10/2017 0514   LABSPEC 1.016  04/10/2017 0514   PHURINE 7.0 04/10/2017 0514   GLUCOSEU NEGATIVE 04/10/2017 0514   HGBUR NEGATIVE 04/10/2017 0514   BILIRUBINUR NEGATIVE 04/10/2017 0514   KETONESUR NEGATIVE 04/10/2017 0514   PROTEINUR 30 (A) 04/10/2017 0514   UROBILINOGEN 1.0 10/11/2014 1428   NITRITE NEGATIVE 04/10/2017 0514   LEUKOCYTESUR NEGATIVE 04/10/2017 0514   Sepsis Labs Invalid input(s): PROCALCITONIN,  WBC,  LACTICIDVEN Microbiology Recent Results (from the past 240 hour(s))  MRSA PCR Screening     Status: None   Collection Time: 04/10/17  8:28 AM  Result Value Ref Range Status   MRSA by PCR NEGATIVE NEGATIVE Final    Comment:        The GeneXpert MRSA Assay (FDA approved for NASAL specimens only), is one component of a comprehensive MRSA colonization surveillance program. It is not intended to diagnose MRSA infection nor to guide or monitor treatment for MRSA infections.   Culture, blood (routine x 2) Call MD if unable to obtain prior to antibiotics being given     Status: None (Preliminary result)   Collection Time: 04/10/17  9:43 AM  Result Value Ref Range Status   Specimen Description RIGHT ANTECUBITAL  Final   Special Requests   Final    BOTTLES DRAWN AEROBIC AND ANAEROBIC Blood Culture adequate volume   Culture NO GROWTH 3 DAYS  Final   Report Status PENDING  Incomplete  Culture, blood (routine x 2) Call MD if unable to obtain prior to antibiotics being given     Status: None (Preliminary result)   Collection Time: 04/10/17  9:43 AM  Result Value Ref Range Status   Specimen Description BLOOD RIGHT ARM  Final   Special Requests   Final    BOTTLES DRAWN AEROBIC AND ANAEROBIC Blood Culture adequate volume   Culture NO GROWTH 3 DAYS  Final   Report Status PENDING  Incomplete   Time coordinating discharge: 38 mins  SIGNED:  Irwin Brakeman, MD  [facility] 04/13/2017, 9:18 AM Pager [phone]  If 7PM-7AM, please contact night-coverage [email or web address] Password [email or web address]

## 2017-04-12 NOTE — Progress Notes (Signed)
04/12/2017 4:05 PM  Discharge held until tomorrow.    Murvin Natal MD

## 2017-04-13 ENCOUNTER — Inpatient Hospital Stay
Admission: RE | Admit: 2017-04-13 | Discharge: 2017-05-03 | Disposition: A | Discharge status: Nursing Home (Includes ICF) | Payer: Medicare Other | Source: Ambulatory Visit | Attending: Internal Medicine | Admitting: Internal Medicine

## 2017-04-13 DIAGNOSIS — G9340 Encephalopathy, unspecified: Secondary | ICD-10-CM | POA: Diagnosis not present

## 2017-04-13 DIAGNOSIS — E1122 Type 2 diabetes mellitus with diabetic chronic kidney disease: Secondary | ICD-10-CM | POA: Diagnosis present

## 2017-04-13 DIAGNOSIS — R635 Abnormal weight gain: Secondary | ICD-10-CM | POA: Diagnosis not present

## 2017-04-13 DIAGNOSIS — F0390 Unspecified dementia without behavioral disturbance: Secondary | ICD-10-CM | POA: Diagnosis not present

## 2017-04-13 DIAGNOSIS — I4891 Unspecified atrial fibrillation: Secondary | ICD-10-CM | POA: Diagnosis not present

## 2017-04-13 DIAGNOSIS — E1142 Type 2 diabetes mellitus with diabetic polyneuropathy: Secondary | ICD-10-CM | POA: Diagnosis present

## 2017-04-13 DIAGNOSIS — G9341 Metabolic encephalopathy: Secondary | ICD-10-CM | POA: Diagnosis present

## 2017-04-13 DIAGNOSIS — J690 Pneumonitis due to inhalation of food and vomit: Secondary | ICD-10-CM | POA: Diagnosis not present

## 2017-04-13 DIAGNOSIS — R262 Difficulty in walking, not elsewhere classified: Secondary | ICD-10-CM | POA: Diagnosis not present

## 2017-04-13 DIAGNOSIS — S32010D Wedge compression fracture of first lumbar vertebra, subsequent encounter for fracture with routine healing: Secondary | ICD-10-CM | POA: Diagnosis not present

## 2017-04-13 DIAGNOSIS — R443 Hallucinations, unspecified: Secondary | ICD-10-CM | POA: Diagnosis not present

## 2017-04-13 DIAGNOSIS — E1165 Type 2 diabetes mellitus with hyperglycemia: Secondary | ICD-10-CM | POA: Diagnosis present

## 2017-04-13 DIAGNOSIS — D72829 Elevated white blood cell count, unspecified: Secondary | ICD-10-CM | POA: Diagnosis not present

## 2017-04-13 DIAGNOSIS — N183 Chronic kidney disease, stage 3 (moderate): Secondary | ICD-10-CM | POA: Diagnosis not present

## 2017-04-13 DIAGNOSIS — I129 Hypertensive chronic kidney disease with stage 1 through stage 4 chronic kidney disease, or unspecified chronic kidney disease: Secondary | ICD-10-CM | POA: Diagnosis present

## 2017-04-13 DIAGNOSIS — Z8701 Personal history of pneumonia (recurrent): Secondary | ICD-10-CM | POA: Diagnosis not present

## 2017-04-13 DIAGNOSIS — I482 Chronic atrial fibrillation: Secondary | ICD-10-CM | POA: Diagnosis not present

## 2017-04-13 DIAGNOSIS — I00 Rheumatic fever without heart involvement: Secondary | ICD-10-CM | POA: Diagnosis not present

## 2017-04-13 DIAGNOSIS — I10 Essential (primary) hypertension: Secondary | ICD-10-CM | POA: Diagnosis not present

## 2017-04-13 DIAGNOSIS — J441 Chronic obstructive pulmonary disease with (acute) exacerbation: Secondary | ICD-10-CM | POA: Diagnosis not present

## 2017-04-13 DIAGNOSIS — J449 Chronic obstructive pulmonary disease, unspecified: Secondary | ICD-10-CM | POA: Diagnosis not present

## 2017-04-13 DIAGNOSIS — J9611 Chronic respiratory failure with hypoxia: Secondary | ICD-10-CM | POA: Diagnosis not present

## 2017-04-13 DIAGNOSIS — Y95 Nosocomial condition: Secondary | ICD-10-CM | POA: Diagnosis present

## 2017-04-13 DIAGNOSIS — E118 Type 2 diabetes mellitus with unspecified complications: Secondary | ICD-10-CM | POA: Diagnosis not present

## 2017-04-13 DIAGNOSIS — F329 Major depressive disorder, single episode, unspecified: Secondary | ICD-10-CM | POA: Diagnosis present

## 2017-04-13 DIAGNOSIS — E876 Hypokalemia: Secondary | ICD-10-CM | POA: Diagnosis not present

## 2017-04-13 DIAGNOSIS — R7881 Bacteremia: Secondary | ICD-10-CM | POA: Diagnosis not present

## 2017-04-13 DIAGNOSIS — K219 Gastro-esophageal reflux disease without esophagitis: Secondary | ICD-10-CM | POA: Diagnosis present

## 2017-04-13 DIAGNOSIS — R278 Other lack of coordination: Secondary | ICD-10-CM | POA: Diagnosis not present

## 2017-04-13 DIAGNOSIS — J440 Chronic obstructive pulmonary disease with acute lower respiratory infection: Secondary | ICD-10-CM | POA: Diagnosis present

## 2017-04-13 DIAGNOSIS — E860 Dehydration: Secondary | ICD-10-CM | POA: Diagnosis present

## 2017-04-13 DIAGNOSIS — Z9181 History of falling: Secondary | ICD-10-CM | POA: Diagnosis not present

## 2017-04-13 DIAGNOSIS — Z9981 Dependence on supplemental oxygen: Secondary | ICD-10-CM | POA: Diagnosis not present

## 2017-04-13 DIAGNOSIS — N189 Chronic kidney disease, unspecified: Secondary | ICD-10-CM | POA: Diagnosis not present

## 2017-04-13 DIAGNOSIS — M6281 Muscle weakness (generalized): Secondary | ICD-10-CM | POA: Diagnosis not present

## 2017-04-13 DIAGNOSIS — A419 Sepsis, unspecified organism: Secondary | ICD-10-CM | POA: Diagnosis not present

## 2017-04-13 DIAGNOSIS — J189 Pneumonia, unspecified organism: Secondary | ICD-10-CM | POA: Diagnosis not present

## 2017-04-13 DIAGNOSIS — M797 Fibromyalgia: Secondary | ICD-10-CM | POA: Diagnosis present

## 2017-04-13 DIAGNOSIS — R41841 Cognitive communication deficit: Secondary | ICD-10-CM | POA: Diagnosis not present

## 2017-04-13 DIAGNOSIS — T380X5A Adverse effect of glucocorticoids and synthetic analogues, initial encounter: Secondary | ICD-10-CM | POA: Diagnosis present

## 2017-04-13 DIAGNOSIS — K529 Noninfective gastroenteritis and colitis, unspecified: Secondary | ICD-10-CM | POA: Diagnosis not present

## 2017-04-13 DIAGNOSIS — F411 Generalized anxiety disorder: Secondary | ICD-10-CM | POA: Diagnosis present

## 2017-04-13 DIAGNOSIS — N179 Acute kidney failure, unspecified: Secondary | ICD-10-CM | POA: Diagnosis not present

## 2017-04-13 DIAGNOSIS — J9621 Acute and chronic respiratory failure with hypoxia: Secondary | ICD-10-CM | POA: Diagnosis not present

## 2017-04-13 DIAGNOSIS — R05 Cough: Secondary | ICD-10-CM | POA: Diagnosis not present

## 2017-04-13 DIAGNOSIS — J181 Lobar pneumonia, unspecified organism: Secondary | ICD-10-CM | POA: Diagnosis present

## 2017-04-13 DIAGNOSIS — E1140 Type 2 diabetes mellitus with diabetic neuropathy, unspecified: Secondary | ICD-10-CM | POA: Diagnosis not present

## 2017-04-13 DIAGNOSIS — M069 Rheumatoid arthritis, unspecified: Secondary | ICD-10-CM | POA: Diagnosis not present

## 2017-04-13 NOTE — Progress Notes (Signed)
Patient discharging to [facility].  IV removed - WNL.  Report called to [place] at [facility].  Family aware of DC and Tx

## 2017-04-13 NOTE — Plan of Care (Signed)
  Skin Integrity: Risk for impaired skin integrity will decrease 04/13/2017 1201 - Adequate for Discharge by Hyun Marsalis, Leitha Schuller, RN Note Patient has MASD under breasts and in abd folds.  Barrier creams applied

## 2017-04-13 NOTE — Discharge Instructions (Signed)
Follow with Primary MD  Hall, John Z, MD  and other consultant's as instructed your Hospitalist MD ° °Please get a complete blood count and chemistry panel checked by your Primary MD at your next visit, and again as instructed by your Primary MD. ° °Get Medicines reviewed and adjusted: °Please take all your medications with you for your next visit with your Primary MD ° °Laboratory/radiological data: °Please request your Primary MD to go over all hospital tests and procedure/radiological results at the follow up, please ask your Primary MD to get all Hospital records sent to his/her office. ° °In some cases, they will be blood work, cultures and biopsy results pending at the time of your discharge. Please request that your primary care M.D. follows up on these results. ° °Also Note the following: °If you experience worsening of your admission symptoms, develop shortness of breath, life threatening emergency, suicidal or homicidal thoughts you must seek medical attention immediately by calling 911 or calling your MD immediately  if symptoms less severe. ° °You must read complete instructions/literature along with all the possible adverse reactions/side effects for all the Medicines you take and that have been prescribed to you. Take any new Medicines after you have completely understood and accpet all the possible adverse reactions/side effects.  ° °Do not drive when taking Pain medications or sleeping medications (Benzodaizepines) ° °Do not take more than prescribed Pain, Sleep and Anxiety Medications. It is not advisable to combine anxiety,sleep and pain medications without talking with your primary care practitioner ° °Special Instructions: If you have smoked or chewed Tobacco  in the last 2 yrs please stop smoking, stop any regular Alcohol  and or any Recreational drug use. ° °Wear Seat belts while driving. ° °Please note: °You were cared for by a hospitalist during your hospital stay. Once you are discharged,  your primary care physician will handle any further medical issues. Please note that NO REFILLS for any discharge medications will be authorized once you are discharged, as it is imperative that you return to your primary care physician (or establish a relationship with a primary care physician if you do not have one) for your post hospital discharge needs so that they can reassess your need for medications and monitor your lab values. ° ° ° ° °

## 2017-04-13 NOTE — Progress Notes (Signed)
04/13/2017 9:20 AM  Pt was seen and examined and felt medically stable to discharge to SNF today.  Please see updated DC Summary.   Murvin Natal MD

## 2017-04-14 ENCOUNTER — Encounter: Payer: Self-pay | Admitting: Internal Medicine

## 2017-04-14 ENCOUNTER — Encounter (HOSPITAL_COMMUNITY)
Admission: RE | Admit: 2017-04-14 | Discharge: 2017-04-14 | Disposition: A | Discharge status: Home / Self Care | Payer: Medicare Other | Source: Skilled Nursing Facility | Attending: Internal Medicine | Admitting: Internal Medicine

## 2017-04-14 ENCOUNTER — Non-Acute Institutional Stay (SKILLED_NURSING_FACILITY): Payer: Medicare Other | Admitting: Internal Medicine

## 2017-04-14 DIAGNOSIS — N189 Chronic kidney disease, unspecified: Secondary | ICD-10-CM | POA: Diagnosis not present

## 2017-04-14 DIAGNOSIS — F0390 Unspecified dementia without behavioral disturbance: Secondary | ICD-10-CM

## 2017-04-14 DIAGNOSIS — E876 Hypokalemia: Secondary | ICD-10-CM | POA: Diagnosis not present

## 2017-04-14 DIAGNOSIS — J189 Pneumonia, unspecified organism: Secondary | ICD-10-CM | POA: Insufficient documentation

## 2017-04-14 DIAGNOSIS — S32010D Wedge compression fracture of first lumbar vertebra, subsequent encounter for fracture with routine healing: Secondary | ICD-10-CM | POA: Diagnosis not present

## 2017-04-14 DIAGNOSIS — I4891 Unspecified atrial fibrillation: Secondary | ICD-10-CM

## 2017-04-14 LAB — CBC WITH DIFFERENTIAL/PLATELET
BASOS ABS: 0 10*3/uL (ref 0.0–0.1)
BASOS PCT: 0 %
EOS ABS: 0.3 10*3/uL (ref 0.0–0.7)
Eosinophils Relative: 2 %
HCT: 44.2 % (ref 36.0–46.0)
Hemoglobin: 13.6 g/dL (ref 12.0–15.0)
Lymphocytes Relative: 11 %
Lymphs Abs: 1.7 10*3/uL (ref 0.7–4.0)
MCH: 27.1 pg (ref 26.0–34.0)
MCHC: 30.8 g/dL (ref 30.0–36.0)
MCV: 88.2 fL (ref 78.0–100.0)
MONO ABS: 1.5 10*3/uL — AB (ref 0.1–1.0)
Monocytes Relative: 10 %
Neutro Abs: 11.6 10*3/uL — ABNORMAL HIGH (ref 1.7–7.7)
Neutrophils Relative %: 77 %
PLATELETS: 222 10*3/uL (ref 150–400)
RBC: 5.01 MIL/uL (ref 3.87–5.11)
RDW: 16.9 % — AB (ref 11.5–15.5)
WBC: 15.1 10*3/uL — ABNORMAL HIGH (ref 4.0–10.5)

## 2017-04-14 LAB — BASIC METABOLIC PANEL
Anion gap: 11 (ref 5–15)
BUN: 15 mg/dL (ref 6–20)
CALCIUM: 9.3 mg/dL (ref 8.9–10.3)
CO2: 26 mmol/L (ref 22–32)
CREATININE: 1.17 mg/dL — AB (ref 0.44–1.00)
Chloride: 96 mmol/L — ABNORMAL LOW (ref 101–111)
GFR, EST AFRICAN AMERICAN: 47 mL/min — AB (ref >60)
GFR, EST NON AFRICAN AMERICAN: 41 mL/min — AB (ref >60)
GLUCOSE: 194 mg/dL — AB (ref 65–99)
Potassium: 3.3 mmol/L — ABNORMAL LOW (ref 3.5–5.1)
Sodium: 133 mmol/L — ABNORMAL LOW (ref 135–145)

## 2017-04-14 NOTE — Progress Notes (Signed)
Location:   [facility] Room Number: 151/P Place of Service:  SNF (31) Provider:  Cory Roughen, MD  Patient Care Team: Celene Squibb, MD as PCP - General  Extended Emergency Contact Information Primary Emergency Contact: Nance,Sheila Address: [address]          [place], [place] [address] [place] Phone: [phone] Relation: Daughter Secondary Emergency Contact: Charlynne Pander, [place] [address] [place] Phone: [phone] Relation: Grandaughter  Code Status:  Most, DNR Goals of care: Advanced Directive information Advanced Directives 04/14/2017  Does Patient Have a Medical Advance Directive? Yes  Type of Advance Directive Out of facility DNR (pink MOST or yellow form)  Does patient want to make changes to medical advance directive? No - Patient declined  Copy of [place] in Chart? No - copy requested  Would patient like information on creating a medical advance directive? No - Patient declined  Pre-existing out of facility DNR order (yellow form or pink MOST form) -     Chief complaint acute visit follow-up hospitalization for healthcare associated pneumonia complicated with confusion  HPI:  Pt is a 84 y.o. female seen today for follow-up of hospital admission for healthcare associated pneumonia. Patient has a previous history of pneumonia as well as significant dementia COPD atrial fibrillation anticoagulated on Eliquis- hypertension.  Apparently she was hospitalized previously for COPD exasperation and discharged home on prednisone taper nebulizers however she became increasingly weak he presented back to the hospital.  It was noted that her white count was elevated at over 17,000 O2 saturation was in the mid to high 80s on room air creatinine was 1.15.  Potassium was 3.1.  Hx history was positive for pneumonia in the right mid lung.  She also had a CT scan of  the head because of a fall and being on anticoagulation -- did not show significant abnormalities lumbar spine x-ray did show an incidental L1 compression fracture that was thought to be acute  Pneumonia was treated aggressively with antibiotics she is completing a course of doxycycline and apparently this has improved blood cultures were negative influenza screen was negative.  She also had increased confusion this was thought secondary to be to the infection as well as baseline dementia and apparently that improved as well.  Regards to the acute L1 compression fraction the plan was to treat symptomatically with skilled nursing placement.  In regards to atrial fibrillation this was rate controlled she is on Eliquis.  She does have a history of COPD and is on numerous medications including OT nebulizers as well as Freight forwarder.  Apparently this was compensated upon discharge.  She was hypokalemic initially in the hospital and this was supplemented and normalized however on lab done today it appears she again is mildly hypokalemic at 3.3.  Regards to type 2 diabetes hemoglobin A1c was 7.6 in the hospitashe is on Tradjenta once a day  Currently her vital signs are stable    Past Medical History:  Diagnosis Date  . Anemia   . Atrial fibrillation ([facility])   . Back pain   . Bacterial pneumonia   . Cancer ([facility])    Skin   . Chronic respiratory failure ([facility])   . COPD (chronic obstructive pulmonary disease) ([facility])   . Dementia   . Depression   . Diverticulosis   . Diverticulosis   .  Fatty liver   . Fibromyalgia   . Fibromyalgia   . Gastric polyp 09/08/11   at the anastomosis-inflammatory  . Generalized anxiety disorder   . Generalized anxiety disorder   . GERD (gastroesophageal reflux disease)   . 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 Hypercholesteremia   . Hypertension   . Insomnia   . Internal hemorrhoids   . On home O2    qhs  . Osteoporosis   . Rheumatoid  arteritis   . Ulcer    stomach  . Vitamin D deficiency    Past Surgical History:  Procedure Laterality Date  . ABDOMINAL HYSTERECTOMY    . ABDOMINAL SURGERY     removed patial stomach from ulcer  . COLONOSCOPY  04-2001   internal hemorrhoids, panocolonic diverticulosis, and colon polyps   . UPPER GASTROINTESTINAL ENDOSCOPY      Allergies  Allergen Reactions  . Other     Band Aids   . Tape   . Lipitor [Atorvastatin Calcium] Rash and Other (See Comments)    Muscle pain  . Niaspan [Niacin Er] Rash and Other (See Comments)    Muscle pain    Allergies as of 04/14/2017      Reactions   Other    Band Aids    Tape    Lipitor [atorvastatin Calcium] Rash, Other (See Comments)   Muscle pain   Niaspan [niacin Er] Rash, Other (See Comments)   Muscle pain      Medication List    Notice   This visit is during an admission. Changes to the med list made in this visit will be reflected in the After Visit Summary of the admission.     Review of Systems    In general she is not complaining of any fever chills says she is feeling kind of weak   skin does not complain of rashes or itching appears to have somewhat chronic bruising  Is not new.  Head ears eyes nose mouth and throat is not complaining of visual changes or sore throat.  Respiratory  is on oxygen does not complain of increased shortness of breath beyond baseline-at times a cough she feels is getting to be more productive.  Cardiac denies chest pain has some chronic venous stasis changes lower extremity edema.  GI is not complaining of abdominal discomfort nausea vomiting diarrhea constipation .  GU is not complaining of dysuria.  Musculoskeletal does complain at times of some lower back pain with L1 compression fracture apparently the medication is helping.  Also is complaining of weakness.  Neurologic other than weakness has no complaints does not complain of numbness syncope dizziness.  Psych does not  complain currently of depression or anxiety appears to be in good spirits Immunization History  Administered Date(s) Administered  . Tdap 10/21/2016   Pertinent  Health Maintenance Due  Topic Date Due  . INFLUENZA VACCINE  05/12/2017 (Originally 10/12/2016)  . DEXA SCAN  05/12/2017 (Originally 06/06/1995)  . PNA vac Low Risk Adult (1 of 2 - PCV13) 05/12/2017 (Originally 06/06/1995)   No flowsheet data found. Functional Status Survey:    Vitals:   04/14/17 1029  BP: (!) 121/57  Pulse: 92  Resp: 20  Temp: 97.6 F (36.4 C)  TempSrc: Oral   There is no height or weight on file to calculate BMI. Physical Exam in general this is a somewhat obese elderly female in no distress sitting comfortably in her wheelchair she does have oxygen applied.     Skin  is warm and dry she does have some venous stasis changes of her lower extremities bilaterally and does have some what appears to be somewhat chronic bruising diffuse most prominently of her arms.  Eyes sclera and conjunctive are clear visual acuity appears grossly intact.  Oropharynx is clear mucous membranes moist.   chest she does have some slight rales at the bases there is no labored breathing.  Heart is distant heart sounds regular irregular rate and rhythm she has venous stasis edema bilaterally.  Abdomen is obese soft nontender with positive bowel sounds.  Musculoskeletal has lower extremity weakness which is been baseline does move her extremities upper it appears with baseline strength.  Neurologic is grossly intact her speech is clear no lateralizing findings.  Psych she is grossly alert and oriented does follow simple verbal commands can carry on a simple conversation appropriately.    Labs reviewed: Recent Labs    03/31/17 1852  04/02/17 0627 04/10/17 0557 04/10/17 0943 04/11/17 0452 04/12/17 0448 04/14/17 0700  NA 142   < > 140 134*  --  136  --  133*  K 3.5   < > 4.1 3.1*  --  3.8  --  3.3*  CL 102   < >  102 95*  --  100*  --  96*  CO2 29   < > 28 27  --  26  --  26  GLUCOSE 165*   < > 241* 171*  --  180*  --  194*  BUN 17   < > 27* 23*  --  16  --  15  CREATININE 1.29*   < > 1.35* 1.15*  --  1.06* 1.09* 1.17*  CALCIUM 9.7   < > 10.4* 9.3  --  9.1  --  9.3  MG 2.0  --  2.1  --  1.8  --   --   --   PHOS 3.0  --   --   --   --   --   --   --    < > = values in this interval not displayed.   Recent Labs    07/27/16 0509 10/25/16 1924 03/31/17 1852  AST 20 47* 30  ALT 15 39 17  ALKPHOS 52 75 70  BILITOT 0.6 0.6 0.7  PROT 5.3* 6.4* 6.9  ALBUMIN 2.5* 3.1* 3.2*   Recent Labs    03/31/17 1852  04/10/17 0557 04/11/17 0452 04/14/17 0700  WBC 9.2   < > 17.2* 14.6* 15.1*  NEUTROABS 6.4  --  13.9*  --  11.6*  HGB 13.4   < > 12.6 12.9 13.6  HCT 44.7   < > 41.2 43.1 44.2  MCV 89.4   < > 87.8 89.4 88.2  PLT 206   < > 138* 137* 222   < > = values in this interval not displayed.   Lab Results  Component Value Date   TSH 2.992 07/26/2016   Lab Results  Component Value Date   HGBA1C 7.6 (H) 04/02/2017   No results found for: CHOL, HDL, LDLCALC, LDLDIRECT, TRIG, CHOLHDL  Significant Diagnostic Results in last 30 days:  No results found.  Assessment/Plan below this is is is is is is oh is is is is is is is is is is is is is is is is is resistant this is    #1 history of pneumonia--COPD exacerbation  -completing a course of doxycycline-she also has routine nebulizers and continues on Symbicort-she also has  pro-air as needed at this point appears to be stable will will monitor.--She also has guaifenesin as needed  2.  History of COPD please see above again she is on aggressive treatment and I suspect will be from the respiratory issues.  3.  History of atrial fibrillation this appears rate controlled-on Cartia XT-- --is on Eliquis for anticoagulation.  4.  History of L1 compression fracture thought to be acute emphasis is on symptomatic care-will need PT and OT continue to monitor  she does have oxycodone as needed for pain is also on Cymbalta.  5.  History of mild hypokalemia we will supplement this magnesium level was normal in hospital will need an updated metabolic panel in several days to ensure stability.  6.  History of diabetes type 2 she is on Tradjenta her blood sugar this morning was 189 at this point will monitor hemoglobin A1c in hospital was 7.6  \#7-history of rheumatoid arthritis she is on methotrexate once a week.  8.-  History of GERD continues on a proton pump inhibitor.  9.  History of peripheral neuropathy she is on Lyrica 3 times a day at this point will monitor.  10.  History of anxiety she does have a as needed order for Ativan at this point will monitor.  11.  History of dementia she is on low-dose Aricept at this point continue supportive care and monitor she does appear to have Ativan as needed for situational agitation and anxiety.  12.  Hyperlipidemia she is on Crestor.  13.  History of chronic kidney disease appears this is relatively stable with creatinine 1.17 BUN 15 on lab done today  Will update this first laboratory day next week also will need to recheck the potassium.  14.  History of leukocytosis thought most likely secondary to infectious pneumonia- white count of 15,100 appears relatively baseline with white count 3 days ago-neutrophils appear to be declining-will update this first laboratory day next week and monitor clinically   Again will Supplement potassium with 20 mEq I a day through the weekend and update a CBC with differential and metabolic panel on Monday, February 4.   [id number] note greater than 40 minutes spent assessing patient-reviewing her chart-reviewing her labs-and coordinating and formulating a plan of care for numerous diagnoses- of note greater than 50% of time spent coordinating plan of care

## 2017-04-15 LAB — CULTURE, BLOOD (ROUTINE X 2)
CULTURE: NO GROWTH
CULTURE: NO GROWTH
SPECIAL REQUESTS: ADEQUATE
Special Requests: ADEQUATE

## 2017-04-17 ENCOUNTER — Encounter: Payer: Self-pay | Admitting: Internal Medicine

## 2017-04-17 ENCOUNTER — Non-Acute Institutional Stay (SKILLED_NURSING_FACILITY): Payer: Medicare Other | Admitting: Internal Medicine

## 2017-04-17 ENCOUNTER — Encounter (HOSPITAL_COMMUNITY)
Admission: RE | Admit: 2017-04-17 | Discharge: 2017-04-17 | Disposition: A | Discharge status: Home / Self Care | Payer: Medicare Other | Source: Skilled Nursing Facility | Attending: *Deleted | Admitting: *Deleted

## 2017-04-17 DIAGNOSIS — J189 Pneumonia, unspecified organism: Secondary | ICD-10-CM

## 2017-04-17 DIAGNOSIS — M0520 Rheumatoid vasculitis with rheumatoid arthritis of unspecified site: Secondary | ICD-10-CM

## 2017-04-17 DIAGNOSIS — F0390 Unspecified dementia without behavioral disturbance: Secondary | ICD-10-CM | POA: Diagnosis not present

## 2017-04-17 DIAGNOSIS — I4891 Unspecified atrial fibrillation: Secondary | ICD-10-CM

## 2017-04-17 DIAGNOSIS — S32010D Wedge compression fracture of first lumbar vertebra, subsequent encounter for fracture with routine healing: Secondary | ICD-10-CM

## 2017-04-17 DIAGNOSIS — I00 Rheumatic fever without heart involvement: Secondary | ICD-10-CM | POA: Diagnosis not present

## 2017-04-17 DIAGNOSIS — I10 Essential (primary) hypertension: Secondary | ICD-10-CM

## 2017-04-17 DIAGNOSIS — J449 Chronic obstructive pulmonary disease, unspecified: Secondary | ICD-10-CM | POA: Diagnosis not present

## 2017-04-17 LAB — BASIC METABOLIC PANEL
Anion gap: 10 (ref 5–15)
BUN: 19 mg/dL (ref 6–20)
CHLORIDE: 100 mmol/L — AB (ref 101–111)
CO2: 27 mmol/L (ref 22–32)
CREATININE: 1.44 mg/dL — AB (ref 0.44–1.00)
Calcium: 9.7 mg/dL (ref 8.9–10.3)
GFR calc non Af Amer: 32 mL/min — ABNORMAL LOW (ref >60)
GFR, EST AFRICAN AMERICAN: 37 mL/min — AB (ref >60)
Glucose, Bld: 190 mg/dL — ABNORMAL HIGH (ref 65–99)
Potassium: 3.7 mmol/L (ref 3.5–5.1)
Sodium: 137 mmol/L (ref 135–145)

## 2017-04-17 LAB — CBC WITH DIFFERENTIAL/PLATELET
BASOS PCT: 0 %
Basophils Absolute: 0 10*3/uL (ref 0.0–0.1)
EOS ABS: 0.3 10*3/uL (ref 0.0–0.7)
Eosinophils Relative: 4 %
HEMATOCRIT: 40.2 % (ref 36.0–46.0)
HEMOGLOBIN: 11.9 g/dL — AB (ref 12.0–15.0)
Lymphocytes Relative: 15 %
Lymphs Abs: 1.4 10*3/uL (ref 0.7–4.0)
MCH: 26.3 pg (ref 26.0–34.0)
MCHC: 29.6 g/dL — ABNORMAL LOW (ref 30.0–36.0)
MCV: 88.9 fL (ref 78.0–100.0)
MONOS PCT: 8 %
Monocytes Absolute: 0.8 10*3/uL (ref 0.1–1.0)
NEUTROS PCT: 73 %
Neutro Abs: 7 10*3/uL (ref 1.7–7.7)
Platelets: 149 10*3/uL — ABNORMAL LOW (ref 150–400)
RBC: 4.52 MIL/uL (ref 3.87–5.11)
RDW: 16.9 % — ABNORMAL HIGH (ref 11.5–15.5)
WBC: 9.5 10*3/uL (ref 4.0–10.5)

## 2017-04-17 NOTE — Progress Notes (Addendum)
Provider:  Veleta Miners Location:   [facility] Room Number: 151/P Place of Service:  SNF (31)  PCP: Celene Squibb, MD Patient Care Team: Celene Squibb, MD as PCP - General  Extended Emergency Contact Information Primary Emergency Contact: Nance,Sheila Address: [address]          [place], [place] [address] [place] Phone: [phone] Relation: Daughter Secondary Emergency Contact: Charlynne Pander, [place] [address] [place] Phone: [phone] Relation: Grandaughter  Code Status: DNR Goals of Care: Advanced Directive information Advanced Directives 04/17/2017  Does Patient Have a Medical Advance Directive? Yes  Type of Advance Directive Out of facility DNR (pink MOST or yellow form)  Does patient want to make changes to medical advance directive? No - Patient declined  Copy of [place] in Chart? No - copy requested  Would patient like information on creating a medical advance directive? No - Patient declined  Pre-existing out of facility DNR order (yellow form or pink MOST form) -      Chief Complaint  Patient presents with  . New Admit To SNF    New Admission Visit    HPI: Patient is a 84 y.o. female seen today for admission to SNF for therapy after being in the hospital from 01/28-01/31 for Pneumonia and Metabolic Encephalopathy  Patient has h/o  COPD, Chronic Atrial Fibrillation on Eliquis, Rheumatoid arthritis, Fibromyalgia, Peripheral Neuropathy, Hyperlipidemia, PUD, and anxiety disorder.Dementia She was initially admitted from 01/18-01/21 For COPD and was send home with Prednisone. She was brought back to ED after suffering a fall in [place]. She was trying to transfer herself from Wheelchair to bed when she fell. She was found to have RML Pneumonia .  CT scan of the head and Spine did  not show any acute changes. Her Lumbar spine X-ray did show Acute L1  compression fracture. Blood cultures and influenza PCR were negative.  She was switched to oral doxycycline for 5 more days.  She did stay confused in the hospital and has a baseline dementia. Patient had another fall yesterday per nurses.  She had a bruise on her left leg.  Patient was unable to give me any history due to her dementia.  She did complain of shortness of breath and cough.  She has been afebrile.  She does not remember that she fell. She has been staying in assisted living for the past 5 months.  Her daughter she was wheelchair-bound and not using walker as it was not deemed to be safe.  She was independent in transfers.  .  Past Medical History:  Diagnosis Date  . Anemia   . Atrial fibrillation ([place])   . Back pain   . Bacterial pneumonia   . Cancer (HCC)    Skin   . Chronic respiratory failure ([place])   . COPD (chronic obstructive pulmonary disease) ([place])   . Dementia   . Depression   . Diverticulosis   . Diverticulosis   . Fatty liver   . Fibromyalgia   . Fibromyalgia   . Gastric polyp 09/08/11   at the anastomosis-inflammatory  . Generalized anxiety disorder   . Generalized anxiety disorder   . GERD (gastroesophageal reflux disease)   . 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 Hypercholesteremia   . Hypertension   . Insomnia   . Internal hemorrhoids   . On home O2  qhs  . Osteoporosis   . Rheumatoid arteritis   . Ulcer    stomach  . Vitamin D deficiency    Past Surgical History:  Procedure Laterality Date  . ABDOMINAL HYSTERECTOMY    . ABDOMINAL SURGERY     removed patial stomach from ulcer  . COLONOSCOPY  04-2001   internal hemorrhoids, panocolonic diverticulosis, and colon polyps   . UPPER GASTROINTESTINAL ENDOSCOPY      reports that  has never smoked. she has never used smokeless tobacco. She reports that she does not drink alcohol or use drugs. Social History   Socioeconomic History  . Marital status: Widowed    Spouse name: Not on file    . Number of children: 6  . Years of education: Not on file  . Highest education level: Not on file  Social Needs  . Financial resource strain: Not on file  . Food insecurity - worry: Not on file  . Food insecurity - inability: Not on file  . Transportation needs - medical: Not on file  . Transportation needs - non-medical: Not on file  Occupational History  . Occupation: Retired  Tobacco Use  . Smoking status: Never Smoker  . Smokeless tobacco: Never Used  Substance and Sexual Activity  . Alcohol use: No  . Drug use: No  . Sexual activity: Not on file  Other Topics Concern  . Not on file  Social History Narrative   Daily caffeine     Functional Status Survey:    Family History  Problem Relation Age of Onset  . Heart disease Father   . Kidney disease Mother        kidney cancer  . Colon cancer Daughter 34    Health Maintenance  Topic Date Due  . INFLUENZA VACCINE  05/12/2017 (Originally 10/12/2016)  . DEXA SCAN  05/12/2017 (Originally 06/06/1995)  . PNA vac Low Risk Adult (1 of 2 - PCV13) 05/12/2017 (Originally 06/06/1995)  . TETANUS/TDAP  10/22/2026    Allergies  Allergen Reactions  . Other     Band Aids   . Tape   . Lipitor [Atorvastatin Calcium] Rash and Other (See Comments)    Muscle pain  . Niaspan [Niacin Er] Rash and Other (See Comments)    Muscle pain    Outpatient Encounter Medications as of 04/17/2017  Medication Sig  . albuterol (PROAIR HFA) 108 (90 BASE) MCG/ACT inhaler Inhale 2 puffs into the lungs every 6 (six) hours as needed for wheezing or shortness of breath. For rescue   . apixaban (ELIQUIS) 2.5 MG TABS tablet Take 2.5 mg by mouth 2 (two) times daily.  Roseanne Kaufman [place]-Castor Oil (VENELEX) OINT Apply to sacrum and bilateral buttocks q shift and prn for prevention  . budesonide-formoterol (SYMBICORT) 80-4.5 MCG/ACT inhaler Inhale 2 puffs into the lungs 2 (two) times daily.  Marland Kitchen CARTIA XT 300 MG 24 hr capsule TAKE ONE CAPSULE BY MOUTH EVERY DAY  .  donepezil (ARICEPT) 5 MG tablet Take 5 mg by mouth at bedtime.  . [EXPIRED] doxycycline (VIBRA-TABS) 100 MG tablet Take 1 tablet (100 mg total) by mouth every 12 (twelve) hours for 5 days.  . DULoxetine (CYMBALTA) 60 MG capsule Take 60 mg by mouth daily.    Marland Kitchen guaifenesin (ROBITUSSIN) 100 MG/5ML syrup Take 200 mg by mouth 4 (four) times daily as needed for cough.  Marland Kitchen ipratropium-albuterol (DUONEB) 0.5-2.5 (3) MG/3ML SOLN Take 3 mLs by nebulization every 6 (six) hours.  . Lactobacillus (ACIDOPHILUS/PECTIN PO) Take 1 capsule  by mouth daily.  Marland Kitchen linagliptin (TRADJENTA) 5 MG TABS tablet Take 1 tablet (5 mg total) by mouth daily.  Marland Kitchen LORazepam (ATIVAN) 0.5 MG tablet Take 0.5 tablets (0.25 mg total) by mouth 2 (two) times daily as needed for anxiety.  Marland Kitchen LYRICA 100 MG capsule Take 100 mg by mouth 3 (three) times daily.  . methotrexate 2.5 MG tablet Take 12.5 mg by mouth. On Thursday at noon.  . Multiple Vitamin (DAILY VITE PO) Take 1 tablet by mouth daily.  Marland Kitchen oxyCODONE-acetaminophen (PERCOCET) 7.5-325 MG tablet Take 1 tablet by mouth every 6 (six) hours as needed for severe pain.  . pantoprazole (PROTONIX) 40 MG tablet Take 1 tablet (40 mg total) by mouth daily.  . phenylephrine-shark liver oil-mineral oil-petrolatum (PREPARATION H) 0.25-3-14-71.9 % rectal ointment Place 1 application rectally 2 (two) times daily as needed for hemorrhoids.  . polyethylene glycol (MIRALAX / GLYCOLAX) packet Take 17 g by mouth daily as needed for moderate constipation.  . raloxifene (EVISTA) 60 MG tablet Take 60 mg by mouth daily.  . rosuvastatin (CRESTOR) 20 MG tablet Take 20 mg by mouth daily.   No facility-administered encounter medications on file as of 04/17/2017.      Review of Systems  Constitutional: Negative.   HENT: Negative.   Respiratory: Positive for cough and shortness of breath.   Cardiovascular: Negative.   Gastrointestinal: Negative.   Genitourinary: Negative.   Musculoskeletal: Positive for back pain.   Skin: Positive for wound.  Neurological: Negative.   Psychiatric/Behavioral: Positive for confusion.    Vitals:   04/17/17 1757  BP: (!) 108/54  Pulse: 84  Resp: 20  Temp: (!) 97.3 F (36.3 C)   There is no height or weight on file to calculate BMI. Physical Exam  Constitutional: She appears well-developed and well-nourished.  HENT:  Head: Normocephalic.  Mouth/Throat: Oropharynx is clear and moist.  Eyes: Pupils are equal, round, and reactive to light.  Neck: Neck supple.  Cardiovascular: Normal rate and normal heart sounds. An irregular rhythm present.  Pulmonary/Chest: Effort normal. She has rales.  Abdominal: Soft. Bowel sounds are normal. She exhibits no distension. There is no tenderness. There is no rebound.  Musculoskeletal:  Has Chronic Venous changes with Mild edema Bilateral. Had Bruise and tear in LLE  Lymphadenopathy:    She has no cervical adenopathy.  Neurological: She is alert.  Did not knew the place. Did know her age and DOB No Focal deficits.  Skin: Skin is warm and dry.  Psychiatric: She has a normal mood and affect. Her behavior is normal. Thought content normal.    Labs reviewed: Basic Metabolic Panel: Recent Labs    03/31/17 1852  04/02/17 0627  04/10/17 0943 04/11/17 0452 04/12/17 0448 04/14/17 0700 04/17/17 0330  NA 142   < > 140   < >  --  136  --  133* 137  K 3.5   < > 4.1   < >  --  3.8  --  3.3* 3.7  CL 102   < > 102   < >  --  100*  --  96* 100*  CO2 29   < > 28   < >  --  26  --  26 27  GLUCOSE 165*   < > 241*   < >  --  180*  --  194* 190*  BUN 17   < > 27*   < >  --  16  --  15 19  CREATININE  1.29*   < > 1.35*   < >  --  1.06* 1.09* 1.17* 1.44*  CALCIUM 9.7   < > 10.4*   < >  --  9.1  --  9.3 9.7  MG 2.0  --  2.1  --  1.8  --   --   --   --   PHOS 3.0  --   --   --   --   --   --   --   --    < > = values in this interval not displayed.   Liver Function Tests: Recent Labs    07/27/16 0509 10/25/16 1924 03/31/17 1852    AST 20 47* 30  ALT 15 39 17  ALKPHOS 52 75 70  BILITOT 0.6 0.6 0.7  PROT 5.3* 6.4* 6.9  ALBUMIN 2.5* 3.1* 3.2*   Recent Labs    07/26/16 0421  LIPASE 19   No results for input(s): AMMONIA in the last 8760 hours. CBC: Recent Labs    04/10/17 0557 04/11/17 0452 04/14/17 0700 04/17/17 0330  WBC 17.2* 14.6* 15.1* 9.5  NEUTROABS 13.9*  --  11.6* 7.0  HGB 12.6 12.9 13.6 11.9*  HCT 41.2 43.1 44.2 40.2  MCV 87.8 89.4 88.2 88.9  PLT 138* 137* 222 149*   Cardiac Enzymes: Recent Labs    07/26/16 0421 03/31/17 1852  CKTOTAL 39  --   TROPONINI  --  <0.03   BNP: Invalid input(s): POCBNP Lab Results  Component Value Date   HGBA1C 7.6 (H) 04/02/2017   Lab Results  Component Value Date   TSH 2.992 07/26/2016   Lab Results  Component Value Date   VITAMINB12 771 09/12/2014   Lab Results  Component Value Date   FOLATE  04/15/2010    >20.0 (NOTE)  Reference Ranges        Deficient:       0.4 - 3.3 ng/mL        Indeterminate:   3.4 - 5.4 ng/mL        Normal:              > 5.4 ng/mL   Lab Results  Component Value Date   IRON 49 04/15/2010   TIBC 230 (L) 04/15/2010   FERRITIN 100 04/15/2010    Imaging and Procedures obtained prior to SNF admission: No results found.  Assessment/Plan healthcare-associated pneumonia Patient on Doxycycline for 5 more days. Leucocytosis Resolved.  COPD Patient did have rale son My Exam. Will continue her on Nebs and Symbicort  Atrial fibrillation Rate Control on Diltiazem On Eliquis. Have to reconsider if patient continues to have falls. Essential hypertension BP controlled  Rheumatoid arteritis On Chronic Methorexate Diabetes Type 2 On Tradjenta A1C 7.6 in hospital Accu checks QD.   Closed compression fracture of L1 lumbar vertebra  On Oxycodone PRN Dementia with Falls Patient continues to stay high risk for falls due to her not calling for help for her transfers. D/W Daughter in the room  Will start on  therapy Hyperlipidemia On Crestor Family/ staff Communication:   Labs/tests ordered:  Total time spent in this patient care encounter was 45_ minutes; greater than 50% of the visit spent counseling patient, reviewing records , Labs and coordinating care for problems addressed at this encounter.

## 2017-04-18 ENCOUNTER — Encounter: Payer: Self-pay | Admitting: Internal Medicine

## 2017-04-19 ENCOUNTER — Non-Acute Institutional Stay (SKILLED_NURSING_FACILITY): Payer: Medicare Other | Admitting: Internal Medicine

## 2017-04-19 ENCOUNTER — Encounter: Payer: Self-pay | Admitting: Internal Medicine

## 2017-04-19 DIAGNOSIS — N183 Chronic kidney disease, stage 3 unspecified: Secondary | ICD-10-CM

## 2017-04-19 DIAGNOSIS — R443 Hallucinations, unspecified: Secondary | ICD-10-CM

## 2017-04-19 DIAGNOSIS — J189 Pneumonia, unspecified organism: Secondary | ICD-10-CM | POA: Diagnosis not present

## 2017-04-19 DIAGNOSIS — S32010D Wedge compression fracture of first lumbar vertebra, subsequent encounter for fracture with routine healing: Secondary | ICD-10-CM | POA: Diagnosis not present

## 2017-04-19 DIAGNOSIS — F0390 Unspecified dementia without behavioral disturbance: Secondary | ICD-10-CM | POA: Diagnosis not present

## 2017-04-19 NOTE — Progress Notes (Signed)
Location:   [facility] Room Number: 151/P Place of Service:  SNF [phone]) Provider:  Cory Roughen, MD  Patient Care Team: Celene Squibb, MD as PCP - General  Extended Emergency Contact Information Primary Emergency Contact: Nance,Sheila Address: [address]          [place], [place] [address] [place] Phone: [phone] Relation: Daughter Secondary Emergency Contact: Charlynne Pander, [place] [address] [place] Phone: [phone] Relation: Grandaughter  Code Status:  DNR Goals of care: Advanced Directive information Advanced Directives 04/19/2017  Does Patient Have a Medical Advance Directive? Yes  Type of Advance Directive Out of facility DNR (pink MOST or yellow form)  Does patient want to make changes to medical advance directive? No - Patient declined  Copy of [place] in Chart? No - copy requested  Would patient like information on creating a medical advance directive? No - Patient declined  Pre-existing out of facility DNR order (yellow form or pink MOST form) -     + Chief complaint acute visit secondary to question hallucinations  HPI:  Pt is a 84 y.o. female seen today for an acute visit for possible hallucinations.  She has a history of COPD as well as atrial fibrillation rheumatoid arthritis fibromyalgia peripheral neuropathy hyperlipidemia as well as peptic ulcer disease anxiety disorder and dementia.  She was initially brought to the ED after sorry sustaining a fall-she was subsequently found to have pneumonia CT of the head and spine did not show any acute changes lumbar spine x-ray did show an acute L1 compression fracture.  She was treated with doxycycline which she is completed and continued to have some confusion in the hospital with baseline dementia.  Apparently while here patient has had occasional hallucinations although this is somewhat  unclear-apparently with 1 of the therapists she talked about being at a funeral home which was not the case- I did speak with her tech today she says she has not really noticed hallucinations although patient does have significant confusion and needs quite a bit of direction when she receives her ADLs.  She does not really complain of dysuria or acute pain at this time.  I do not she does have as needed Ativan which she is only taking about 3 times during her stay here she also has as needed Percocet which she has taken only about 4 times.  Currently she is lying in bed comfortably vital signs are stable she has no acute complaints-she is oriented to self and can tell me the day of the week month year and what city she is in--when I asked her if she is seeing things she appeared to deny this although she did state in the hospital she thought she saw her deceased husband.       Past Medical History:  Diagnosis Date  . Anemia   . Atrial fibrillation ([place])   . Back pain   . Bacterial pneumonia   . Cancer (H[facility])    Skin   . Chronic respiratory failure ([facility] [facility])   . COPD (chronic obstructive pulmonary disease) ([facility])   . Dementia   . Depression   . Diverticulosis   . Diverticulosis   . Fatty liver   . Fibromyalgia   . Fibromyalgia   . Gastric polyp 09/08/11   at the anastomosis-inflammatory  . Generalized anxiety disorder   . Generalized  anxiety disorder   . GERD (gastroesophageal reflux disease)   . 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 Hypercholesteremia   . Hypertension   . Insomnia   . Internal hemorrhoids   . On home O2    qhs  . Osteoporosis   . Rheumatoid arteritis   . Ulcer    stomach  . Vitamin D deficiency    Past Surgical History:  Procedure Laterality Date  . ABDOMINAL HYSTERECTOMY    . ABDOMINAL SURGERY     removed patial stomach from ulcer  . COLONOSCOPY  04-2001   internal hemorrhoids, panocolonic diverticulosis, and colon polyps   . UPPER  GASTROINTESTINAL ENDOSCOPY      Allergies  Allergen Reactions  . Other     Band Aids   . Tape   . Lipitor [Atorvastatin Calcium] Rash and Other (See Comments)    Muscle pain  . Niaspan [Niacin Er] Rash and Other (See Comments)    Muscle pain    Outpatient Encounter Medications as of 04/19/2017  Medication Sig  . albuterol (PROAIR HFA) 108 (90 BASE) MCG/ACT inhaler Inhale 2 puffs into the lungs every 6 (six) hours as needed for wheezing or shortness of breath. For rescue   . apixaban (ELIQUIS) 2.5 MG TABS tablet Take 2.5 mg by mouth 2 (two) times daily.  Roseanne Kaufman Peru-Castor Oil (VENELEX) OINT Apply to sacrum and bilateral buttocks q shift and prn for prevention  . budesonide-formoterol (SYMBICORT) 80-4.5 MCG/ACT inhaler Inhale 2 puffs into the lungs 2 (two) times daily.  Marland Kitchen CARTIA XT 300 MG 24 hr capsule TAKE ONE CAPSULE BY MOUTH EVERY DAY  . donepezil (ARICEPT) 5 MG tablet Take 5 mg by mouth at bedtime.  . DULoxetine (CYMBALTA) 60 MG capsule Take 60 mg by mouth daily.    Marland Kitchen guaifenesin (ROBITUSSIN) 100 MG/5ML syrup Take 200 mg by mouth 4 (four) times daily as needed for cough.  Marland Kitchen ipratropium-albuterol (DUONEB) 0.5-2.5 (3) MG/3ML SOLN Take 3 mLs by nebulization every 6 (six) hours.  Marland Kitchen linagliptin (TRADJENTA) 5 MG TABS tablet Take 1 tablet (5 mg total) by mouth daily.  Marland Kitchen LORazepam (ATIVAN) 0.5 MG tablet Take 0.5 tablets (0.25 mg total) by mouth 2 (two) times daily as needed for anxiety.  Marland Kitchen LYRICA 100 MG capsule Take 100 mg by mouth 3 (three) times daily.  . methotrexate 2.5 MG tablet Take 12.5 mg by mouth. On Thursday at noon.  . Multiple Vitamin (DAILY VITE PO) Take 1 tablet by mouth daily.  Marland Kitchen oxyCODONE-acetaminophen (PERCOCET) 7.5-325 MG tablet Take 1 tablet by mouth every 6 (six) hours as needed for severe pain.  . pantoprazole (PROTONIX) 40 MG tablet Take 1 tablet (40 mg total) by mouth daily.  . phenylephrine-shark liver oil-mineral oil-petrolatum (PREPARATION H) 0.25-3-14-71.9 %  rectal ointment Place 1 application rectally 2 (two) times daily as needed for hemorrhoids.  . polyethylene glycol (MIRALAX / GLYCOLAX) packet Take 17 g by mouth daily as needed for moderate constipation.  . raloxifene (EVISTA) 60 MG tablet Take 60 mg by mouth daily.  . rosuvastatin (CRESTOR) 20 MG tablet Take 20 mg by mouth daily.  . [DISCONTINUED] Lactobacillus (ACIDOPHILUS/PECTIN PO) Take 1 capsule by mouth daily.   No facility-administered encounter medications on file as of 04/19/2017.     Review of Systems   General she is not complaining of any fever chills.  Skin does not complain of rashes or itching.  Head ears eyes nose mouth and throat does not complain of visual changes or sore  throat.  Respiratory continues to have some cough at times is on oxygen does not complain of shortness of breath beyond baseline.  Cardiac is not complaining of chest pain does have some mild lower extremity edema.  GI does not complain of acute abdominal discomfort says occasionally her abdomen with feels a little sore when she is lying in a certain position.  But does not complain of nausea vomiting diarrhea constipation.  GU denies dysuria.  Musculoskeletal does have some history of back pain but this appears to be intermittent.  Neurologic does not complain of dizziness headache numbness at this time or syncope.  Psych does have some history of dementia but does have baseline confusion again possible hallucinations  Immunization History  Administered Date(s) Administered  . Tdap 10/21/2016   Pertinent  Health Maintenance Due  Topic Date Due  . INFLUENZA VACCINE  05/12/2017 (Originally 10/12/2016)  . DEXA SCAN  05/12/2017 (Originally 06/06/1995)  . PNA vac Low Risk Adult (1 of 2 - PCV13) 05/12/2017 (Originally 06/06/1995)   No flowsheet data found. Functional Status Survey:    Vitals:   04/19/17 1405  BP: 127/68  Pulse: 70  Resp: 19  Temp: 98 F (36.7 C)  TempSrc: Oral     Physical Exam In general this is a pleasant well-developed elderly female in no distress lying comfortably in bed she was sleeping when I entered the room but was easily arousable.  Her skin is warm and dry she is not diaphoretic.  Eyes pupils appear reactive to light sclera conjunctive are clear visual acuity appears grossly intact.  Oropharynx is clear mucous membranes moist tongue is midline with full range of motion.  Chest she is she has some scattered rails there is no labored breathing this appears baseline with previous exam.  Heart is irregular irregular rate and rhythm she has some mild lower extremity venous stasis changes edema bilaterally.  Abdomen is soft nontender with positive bowel sounds.  Musculoskeletal is able to move all extremities x4 limited exam since she is in bed but strength appears to be intact all 4 extremities.  Neurologic is grossly intact no lateralizing findings her speech is clear.  Psych she is oriented to self city state with a day of the week to month and year- could not really name specific location she was  At She did follow simple verbal commands without difficulty Labs reviewed: Recent Labs    03/31/17 1852  04/02/17 0627  04/10/17 0943 04/11/17 0452 04/12/17 0448 04/14/17 0700 04/17/17 0330  NA 142   < > 140   < >  --  136  --  133* 137  K 3.5   < > 4.1   < >  --  3.8  --  3.3* 3.7  CL 102   < > 102   < >  --  100*  --  96* 100*  CO2 29   < > 28   < >  --  26  --  26 27  GLUCOSE 165*   < > 241*   < >  --  180*  --  194* 190*  BUN 17   < > 27*   < >  --  16  --  15 19  CREATININE 1.29*   < > 1.35*   < >  --  1.06* 1.09* 1.17* 1.44*  CALCIUM 9.7   < > 10.4*   < >  --  9.1  --  9.3 9.7  MG 2.0  --  2.1  --  1.8  --   --   --   --   PHOS 3.0  --   --   --   --   --   --   --   --    < > = values in this interval not displayed.   Recent Labs    07/27/16 0509 10/25/16 1924 03/31/17 1852  AST 20 47* 30  ALT 15 39 17  ALKPHOS  52 75 70  BILITOT 0.6 0.6 0.7  PROT 5.3* 6.4* 6.9  ALBUMIN 2.5* 3.1* 3.2*   Recent Labs    04/10/17 0557 04/11/17 0452 04/14/17 0700 04/17/17 0330  WBC 17.2* 14.6* 15.1* 9.5  NEUTROABS 13.9*  --  11.6* 7.0  HGB 12.6 12.9 13.6 11.9*  HCT 41.2 43.1 44.2 40.2  MCV 87.8 89.4 88.2 88.9  PLT 138* 137* 222 149*   Lab Results  Component Value Date   TSH 2.992 07/26/2016   Lab Results  Component Value Date   HGBA1C 7.6 (H) 04/02/2017   No results found for: CHOL, HDL, LDLCALC, LDLDIRECT, TRIG, CHOLHDL  Significant Diagnostic Results in last 30 days:  Dg Chest 1 View  Result Date: 04/10/2017 CLINICAL DATA:  Patient fell out of bed.  Low back pain. EXAM: CHEST 1 VIEW COMPARISON:  03/31/2017 FINDINGS: Mild cardiac enlargement. No vascular congestion or edema. Interstitial fibrosis in the lungs. There is interval development of superimposed airspace disease in the right mid lung likely representing developing pneumonia. Aspiration or contusion could also potentially have this appearance. Calcified and tortuous aorta. Degenerative changes in the shoulders. IMPRESSION: Cardiac enlargement. Interstitial fibrosis in the lungs. Developing airspace disease in the right mid lung likely represent pneumonia. Aspiration or contusion could also have this appearance. Electronically Signed   By: Lucienne Capers M.D.   On: 04/10/2017 06:41   Dg Chest 2 View  Result Date: 03/31/2017 CLINICAL DATA:  Cough. EXAM: CHEST  2 VIEW COMPARISON:  07/26/2016 and prior radiographs FINDINGS: Cardiomegaly again noted. Bibasilar opacities are present-question atelectasis versus pneumonia. No pleural effusion or pneumothorax. No acute bony abnormalities are noted. IMPRESSION: Bibasilar opacities-question atelectasis versus pneumonia. Cardiomegaly. Electronically Signed   By: Margarette Canada M.D.   On: 03/31/2017 19:34   Dg Lumbar Spine Complete  Result Date: 04/10/2017 CLINICAL DATA:  Patient fell out of bed.  Low back  pain. EXAM: LUMBAR SPINE - COMPLETE 4+ VIEW COMPARISON:  10/25/2016 FINDINGS: Mild lumbar scoliosis convex towards the right. Degenerative changes throughout the lumbar spine with narrowed interspaces and endplate hypertrophic changes. Interval anterior compression of L1 since previous study. Given the history of trauma, this could represent acute compression fracture. No retropulsion of fracture fragments. Degenerative disc disease in the lower lumbar spine. Mild compression of L4 is unchanged. Vascular calcifications throughout the aorta. IMPRESSION: Anterior compression of L1, new since previous study and likely acute. This represents approximately 25% loss of height. Diffuse degenerative change throughout the lumbar spine. Aortic atherosclerosis. Electronically Signed   By: Lucienne Capers M.D.   On: 04/10/2017 06:43   Dg Pelvis 1-2 Views  Result Date: 04/10/2017 CLINICAL DATA:  Patient fell out of bed.  Low back pain. EXAM: PELVIS - 1-2 VIEW COMPARISON:  CT abdomen and pelvis 07/26/2016. FINDINGS: Degenerative changes in the lower lumbar spine and hips. No evidence of acute fracture or dislocation in the pelvis or hips. SI joints and symphysis pubis are not displaced. No focal bone lesion or bone destruction. IMPRESSION: Degenerative changes in the  lower lumbar spine and both hips. No acute bony abnormalities. Electronically Signed   By: Lucienne Capers M.D.   On: 04/10/2017 06:40   Ct Head Wo Contrast  Result Date: 04/10/2017 CLINICAL DATA:  84 year old female fell out of bed. Complaining of lower mid back pain. Does not recall hitting head. Headaches and bilateral neck stiffness. Initial encounter. EXAM: CT HEAD WITHOUT CONTRAST CT CERVICAL SPINE WITHOUT CONTRAST TECHNIQUE: Multidetector CT imaging of the head and cervical spine was performed following the standard protocol without intravenous contrast. Multiplanar CT image reconstructions of the cervical spine were also generated. COMPARISON:   07/16/2016 CT head and cervical spine. FINDINGS: CT HEAD FINDINGS Brain: No intracranial hemorrhage or CT evidence of large acute infarct. Chronic microvascular changes. Focal fat anterior falx otherwise, no intracranial mass lesion noted on this unenhanced exam. Vascular: Ectatic basilar artery.  Calcified carotid arteries. Skull: No skull fracture Sinuses/Orbits: Chronic nonspecific soft tissue swelling inferior medial aspect of the left globe. Partial opacification left frontal sinus. Other: Mastoid air cells and middle ear cavities are clear. CT CERVICAL SPINE FINDINGS Alignment: Minimal curvature convex left. Minimal anterior slip C3 and C4 and minimal retrolisthesis C5 unchanged from prior exam. Skull base and vertebrae: Evaluation slightly limited by artifact from patient's shoulders/habitus. No acute cervical spine fracture noted. Soft tissues and spinal canal: No abnormal prevertebral soft tissue swelling. Disc levels:  Evaluation limited by habitus. Upper chest: Bilateral upper lobe airspace disease greater on right. Please see chest x-ray report from same date. Other: Left thyroid 1.3 centimeter nodule and right thyroid 6 millimeter nodule incidentally noted. Carotid bifurcation calcifications. IMPRESSION: No skull fracture or intracranial hemorrhage. No cervical spine fracture or abnormal prevertebral soft tissue swelling. Alignment similar to prior exam. Evaluation slightly limited by patient's habitus/shoulder artifact. Bilateral upper lobe airspace disease greater on right. Please see chest x-ray report from same date. Electronically Signed   By: Genia Del M.D.   On: 04/10/2017 07:10   Ct Cervical Spine Wo Contrast  Result Date: 04/10/2017 CLINICAL DATA:  84 year old female fell out of bed. Complaining of lower mid back pain. Does not recall hitting head. Headaches and bilateral neck stiffness. Initial encounter. EXAM: CT HEAD WITHOUT CONTRAST CT CERVICAL SPINE WITHOUT CONTRAST TECHNIQUE:  Multidetector CT imaging of the head and cervical spine was performed following the standard protocol without intravenous contrast. Multiplanar CT image reconstructions of the cervical spine were also generated. COMPARISON:  07/16/2016 CT head and cervical spine. FINDINGS: CT HEAD FINDINGS Brain: No intracranial hemorrhage or CT evidence of large acute infarct. Chronic microvascular changes. Focal fat anterior falx otherwise, no intracranial mass lesion noted on this unenhanced exam. Vascular: Ectatic basilar artery.  Calcified carotid arteries. Skull: No skull fracture Sinuses/Orbits: Chronic nonspecific soft tissue swelling inferior medial aspect of the left globe. Partial opacification left frontal sinus. Other: Mastoid air cells and middle ear cavities are clear. CT CERVICAL SPINE FINDINGS Alignment: Minimal curvature convex left. Minimal anterior slip C3 and C4 and minimal retrolisthesis C5 unchanged from prior exam. Skull base and vertebrae: Evaluation slightly limited by artifact from patient's shoulders/habitus. No acute cervical spine fracture noted. Soft tissues and spinal canal: No abnormal prevertebral soft tissue swelling. Disc levels:  Evaluation limited by habitus. Upper chest: Bilateral upper lobe airspace disease greater on right. Please see chest x-ray report from same date. Other: Left thyroid 1.3 centimeter nodule and right thyroid 6 millimeter nodule incidentally noted. Carotid bifurcation calcifications. IMPRESSION: No skull fracture or intracranial hemorrhage. No cervical spine fracture or  abnormal prevertebral soft tissue swelling. Alignment similar to prior exam. Evaluation slightly limited by patient's habitus/shoulder artifact. Bilateral upper lobe airspace disease greater on right. Please see chest x-ray report from same date. Electronically Signed   By: Genia Del M.D.   On: 04/10/2017 07:10    Assessment/Plan  #1 possible hallucinations-medications were reviewed I also did  discuss this with Dr. Leighton Parody this point will discontinue her Percocet and Ativan- will start Tylenol every 8 hours as needed for pain and monitor if not effective certainly will need to readdress.  Also will update her labs including a CBC with differential and metabolic panel tomorrow morning.  2.  History of healthcare associated pneumonia she is complete doxycycline she continues on Symbicort as well as duo nebs this appears relatively baseline still has some scattered congestion but no labored breathing-she does have a history of COPD at this point continue to monitor-she continues on oxygen.  3.  History of atrial fibrillation this appears rate controlled on diltiazem she is on Eliquis for anticoagulation.  4.  Diabetes type 2 this appears stable on Tradjenta blood sugars largely in the mid 100s occasionally slightly over 200 but this is not common.  5.  History of "compression fracture L1-again will DC the Percocet as needed and start Tylenol if this is not effective will have to readdress.  6.  History of dementia she is on low-dose Aricept again she has used the as needed Ativan sparingly if needed we will will have to readdress this but at this point appears stable.  7.  History of renal insufficiency creatinine was up a bit at 1.44 on lab done on February 4 previous creatinine was 1.17-will update this again tomorrow.  8.  History of hypokalemia this was transitory and potassium was supplemented it was 3.7 on lab done February 4 again will update this tomorrow.  [id number]

## 2017-04-20 ENCOUNTER — Encounter: Payer: Self-pay | Admitting: Internal Medicine

## 2017-04-20 ENCOUNTER — Encounter (HOSPITAL_COMMUNITY)
Admission: RE | Admit: 2017-04-20 | Discharge: 2017-04-20 | Disposition: A | Discharge status: Home / Self Care | Payer: Medicare Other | Source: Skilled Nursing Facility | Attending: Internal Medicine | Admitting: Internal Medicine

## 2017-04-20 ENCOUNTER — Non-Acute Institutional Stay (SKILLED_NURSING_FACILITY): Payer: Medicare Other | Admitting: Internal Medicine

## 2017-04-20 DIAGNOSIS — K219 Gastro-esophageal reflux disease without esophagitis: Secondary | ICD-10-CM | POA: Insufficient documentation

## 2017-04-20 DIAGNOSIS — I00 Rheumatic fever without heart involvement: Secondary | ICD-10-CM | POA: Diagnosis not present

## 2017-04-20 DIAGNOSIS — E1140 Type 2 diabetes mellitus with diabetic neuropathy, unspecified: Secondary | ICD-10-CM | POA: Diagnosis not present

## 2017-04-20 DIAGNOSIS — E1142 Type 2 diabetes mellitus with diabetic polyneuropathy: Secondary | ICD-10-CM | POA: Insufficient documentation

## 2017-04-20 DIAGNOSIS — J690 Pneumonitis due to inhalation of food and vomit: Secondary | ICD-10-CM

## 2017-04-20 DIAGNOSIS — J189 Pneumonia, unspecified organism: Secondary | ICD-10-CM | POA: Insufficient documentation

## 2017-04-20 DIAGNOSIS — I10 Essential (primary) hypertension: Secondary | ICD-10-CM | POA: Diagnosis not present

## 2017-04-20 DIAGNOSIS — J441 Chronic obstructive pulmonary disease with (acute) exacerbation: Secondary | ICD-10-CM

## 2017-04-20 DIAGNOSIS — I4891 Unspecified atrial fibrillation: Secondary | ICD-10-CM | POA: Diagnosis not present

## 2017-04-20 DIAGNOSIS — J9611 Chronic respiratory failure with hypoxia: Secondary | ICD-10-CM | POA: Insufficient documentation

## 2017-04-20 DIAGNOSIS — J449 Chronic obstructive pulmonary disease, unspecified: Secondary | ICD-10-CM | POA: Insufficient documentation

## 2017-04-20 DIAGNOSIS — S32010D Wedge compression fracture of first lumbar vertebra, subsequent encounter for fracture with routine healing: Secondary | ICD-10-CM | POA: Diagnosis not present

## 2017-04-20 DIAGNOSIS — M6281 Muscle weakness (generalized): Secondary | ICD-10-CM | POA: Insufficient documentation

## 2017-04-20 DIAGNOSIS — J181 Lobar pneumonia, unspecified organism: Principal | ICD-10-CM

## 2017-04-20 DIAGNOSIS — M0520 Rheumatoid vasculitis with rheumatoid arthritis of unspecified site: Secondary | ICD-10-CM

## 2017-04-20 LAB — BASIC METABOLIC PANEL
ANION GAP: 10 (ref 5–15)
BUN: 17 mg/dL (ref 6–20)
CALCIUM: 9.4 mg/dL (ref 8.9–10.3)
CO2: 26 mmol/L (ref 22–32)
CREATININE: 1.42 mg/dL — AB (ref 0.44–1.00)
Chloride: 100 mmol/L — ABNORMAL LOW (ref 101–111)
GFR, EST AFRICAN AMERICAN: 38 mL/min — AB (ref >60)
GFR, EST NON AFRICAN AMERICAN: 32 mL/min — AB (ref >60)
GLUCOSE: 235 mg/dL — AB (ref 65–99)
Potassium: 3.5 mmol/L (ref 3.5–5.1)
Sodium: 136 mmol/L (ref 135–145)

## 2017-04-20 LAB — CBC WITH DIFFERENTIAL/PLATELET
BASOS ABS: 0.1 10*3/uL (ref 0.0–0.1)
BASOS PCT: 1 %
EOS PCT: 2 %
Eosinophils Absolute: 0.2 10*3/uL (ref 0.0–0.7)
HCT: 38.4 % (ref 36.0–46.0)
Hemoglobin: 11.5 g/dL — ABNORMAL LOW (ref 12.0–15.0)
Lymphocytes Relative: 12 %
Lymphs Abs: 1.4 10*3/uL (ref 0.7–4.0)
MCH: 26.1 pg (ref 26.0–34.0)
MCHC: 29.9 g/dL — AB (ref 30.0–36.0)
MCV: 87.1 fL (ref 78.0–100.0)
Monocytes Absolute: 0.7 10*3/uL (ref 0.1–1.0)
Monocytes Relative: 6 %
Neutro Abs: 9.2 10*3/uL — ABNORMAL HIGH (ref 1.7–7.7)
Neutrophils Relative %: 79 %
PLATELETS: 182 10*3/uL (ref 150–400)
RBC: 4.41 MIL/uL (ref 3.87–5.11)
RDW: 16.7 % — AB (ref 11.5–15.5)
WBC: 11.6 10*3/uL — ABNORMAL HIGH (ref 4.0–10.5)

## 2017-04-20 NOTE — Progress Notes (Addendum)
Location:   [facility] Room Number: 151/P Place of Service:  SNF (31) Provider:  Darrold Span, MD  Patient Care Team: Celene Squibb, MD as PCP - General  Extended Emergency Contact Information Primary Emergency Contact: Nance,Sheila Address: [address]          [place], [place] [address] [place] Phone: [phone] Relation: Daughter Secondary Emergency Contact: Charlynne Pander, [place] [address] [place] Phone: [phone] Relation: Grandaughter  Code Status:  DNR Goals of care: Advanced Directive information Advanced Directives 04/20/2017  Does Patient Have a Medical Advance Directive? Yes  Type of Advance Directive Out of facility DNR (pink MOST or yellow form)  Does patient want to make changes to medical advance directive? No - Patient declined  Copy of [place] in Chart? No - copy requested  Would patient like information on creating a medical advance directive? No - Patient declined  Pre-existing out of facility DNR order (yellow form or pink MOST form) -     Chief Complaint  Patient presents with  . Acute Visit    Patients c/o Cough and Leukocytosis    HPI:  Pt is a 84 y.o. female seen today for an acute visit for Cough and Increased white Count. Patient is admitted to SNF for therapy after being in the hospital for pneumonia and fall with.  Patient has h/o  COPD, Chronic Atrial Fibrillation on Eliquis, Rheumatoid arthritis, Fibromyalgia, Peripheral Neuropathy, Hyperlipidemia, PUD, and anxiety disorder,.Dementia Patient was taken to the hospital after she suffered a fall in assisted living facility.  She was found to have a right middle lobe pneumonia and acute L1 compression fracture.  She was discharged to SNF on doxycycline.  In the facility patient was noticed to be more confused with hallucinations yesterday.  She was afebrile.  She does have a  productive cough .  And the labs show she had elevated white count of 11.6 . Patient unable to give me any detailed history.   Past Medical History:  Diagnosis Date  . Anemia   . Atrial fibrillation ([place])   . Back pain   . Bacterial pneumonia   . Cancer (H[facility])    Skin   . Chronic respiratory failure ([place])   . COPD (chronic obstructive pulmonary disease) ([place])   . Dementia   . Depression   . Diverticulosis   . Diverticulosis   . Fatty liver   . Fibromyalgia   . Fibromyalgia   . Gastric polyp 09/08/11   at the anastomosis-inflammatory  . Generalized anxiety disorder   . Generalized anxiety disorder   . GERD (gastroesophageal reflux disease)   . 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 Hypercholesteremia   . Hypertension   . Insomnia   . Internal hemorrhoids   . On home O2    qhs  . Osteoporosis   . Rheumatoid arteritis   . Ulcer    stomach  . Vitamin D deficiency    Past Surgical History:  Procedure Laterality Date  . ABDOMINAL HYSTERECTOMY    . ABDOMINAL SURGERY     removed patial stomach from ulcer  . COLONOSCOPY  04-2001   internal hemorrhoids, panocolonic diverticulosis, and colon polyps   . UPPER GASTROINTESTINAL ENDOSCOPY      Allergies  Allergen Reactions  . Other     Band Aids   . Tape   .  Lipitor [Atorvastatin Calcium] Rash and Other (See Comments)    Muscle pain  . Niaspan [Niacin Er] Rash and Other (See Comments)    Muscle pain    Outpatient Encounter Medications as of 04/20/2017  Medication Sig  . acetaminophen (TYLENOL) 325 MG tablet Take 650 mg by mouth every 8 (eight) hours as needed.  Marland Kitchen albuterol (PROAIR HFA) 108 (90 BASE) MCG/ACT inhaler Inhale 2 puffs into the lungs every 6 (six) hours as needed for wheezing or shortness of breath. For rescue   . apixaban (ELIQUIS) 2.5 MG TABS tablet Take 2.5 mg by mouth 2 (two) times daily.  Roseanne Kaufman [place]-Castor Oil (VENELEX) OINT Apply to sacrum and bilateral buttocks q shift and prn for prevention  .  budesonide-formoterol (SYMBICORT) 80-4.5 MCG/ACT inhaler Inhale 2 puffs into the lungs 2 (two) times daily.  Marland Kitchen CARTIA XT 300 MG 24 hr capsule TAKE ONE CAPSULE BY MOUTH EVERY DAY  . donepezil (ARICEPT) 5 MG tablet Take 5 mg by mouth at bedtime.  . DULoxetine (CYMBALTA) 60 MG capsule Take 60 mg by mouth daily.    Marland Kitchen guaifenesin (ROBITUSSIN) 100 MG/5ML syrup Take 200 mg by mouth 4 (four) times daily as needed for cough.  Marland Kitchen ipratropium-albuterol (DUONEB) 0.5-2.5 (3) MG/3ML SOLN Take 3 mLs by nebulization every 6 (six) hours.  Marland Kitchen linagliptin (TRADJENTA) 5 MG TABS tablet Take 1 tablet (5 mg total) by mouth daily.  Marland Kitchen LYRICA 100 MG capsule Take 100 mg by mouth 3 (three) times daily.  . methotrexate 2.5 MG tablet Take 12.5 mg by mouth. On Thursday at noon.  . Multiple Vitamin (DAILY VITE PO) Take 1 tablet by mouth daily.  . pantoprazole (PROTONIX) 40 MG tablet Take 1 tablet (40 mg total) by mouth daily.  . phenylephrine-shark liver oil-mineral oil-petrolatum (PREPARATION H) 0.25-3-14-71.9 % rectal ointment Place 1 application rectally 2 (two) times daily as needed for hemorrhoids.  . polyethylene glycol (MIRALAX / GLYCOLAX) packet Take 17 g by mouth daily as needed for moderate constipation.  . raloxifene (EVISTA) 60 MG tablet Take 60 mg by mouth daily.  . rosuvastatin (CRESTOR) 20 MG tablet Take 20 mg by mouth daily.  . [DISCONTINUED] LORazepam (ATIVAN) 0.5 MG tablet Take 0.5 tablets (0.25 mg total) by mouth 2 (two) times daily as needed for anxiety.  . [DISCONTINUED] oxyCODONE-acetaminophen (PERCOCET) 7.5-325 MG tablet Take 1 tablet by mouth every 6 (six) hours as needed for severe pain.   No facility-administered encounter medications on file as of 04/20/2017.      Review of Systems  Constitutional: Negative.   HENT: Negative.   Respiratory: Positive for cough and shortness of breath.   Cardiovascular: Negative.   Gastrointestinal: Negative.   Genitourinary: Negative.   Musculoskeletal: Negative.    Neurological: Negative.   Psychiatric/Behavioral: Negative.     Immunization History  Administered Date(s) Administered  . Tdap 10/21/2016   Pertinent  Health Maintenance Due  Topic Date Due  . INFLUENZA VACCINE  05/12/2017 (Originally 10/12/2016)  . DEXA SCAN  05/12/2017 (Originally 06/06/1995)  . PNA vac Low Risk Adult (1 of 2 - PCV13) 05/12/2017 (Originally 06/06/1995)   No flowsheet data found. Functional Status Survey:    Vitals:   04/20/17 1506  BP: 123/71  Pulse: 80  Resp: 18  Temp: (!) 97.2 F (36.2 C)  SpO2: 94%   There is no height or weight on file to calculate BMI. Physical Exam  Constitutional: She appears well-developed and well-nourished.  HENT:  Head: Normocephalic.  Mouth/Throat: Oropharynx is  clear and moist.  Eyes: Pupils are equal, round, and reactive to light.  Neck: Neck supple.  Cardiovascular: Normal rate and normal heart sounds. An irregular rhythm present.  Pulmonary/Chest: Effort normal. She has wheezes.  Expiratory wheezing bilateral  Abdominal: Soft. Bowel sounds are normal. She exhibits no distension. There is no tenderness. There is no rebound.  Musculoskeletal:  Trace edema bilateral  Neurological: She is alert.  Skin: Skin is warm and dry.    Labs reviewed: Recent Labs    03/31/17 1852  04/02/17 0627  04/10/17 0943  04/14/17 0700 04/17/17 0330 04/20/17 0745  NA 142   < > 140   < >  --    < > 133* 137 136  K 3.5   < > 4.1   < >  --    < > 3.3* 3.7 3.5  CL 102   < > 102   < >  --    < > 96* 100* 100*  CO2 29   < > 28   < >  --    < > 26 27 26   GLUCOSE 165*   < > 241*   < >  --    < > 194* 190* 235*  BUN 17   < > 27*   < >  --    < > 15 19 17   CREATININE 1.29*   < > 1.35*   < >  --    < > 1.17* 1.44* 1.42*  CALCIUM 9.7   < > 10.4*   < >  --    < > 9.3 9.7 9.4  MG 2.0  --  2.1  --  1.8  --   --   --   --   PHOS 3.0  --   --   --   --   --   --   --   --    < > = values in this interval not displayed.   Recent Labs     07/27/16 0509 10/25/16 1924 03/31/17 1852  AST 20 47* 30  ALT 15 39 17  ALKPHOS 52 75 70  BILITOT 0.6 0.6 0.7  PROT 5.3* 6.4* 6.9  ALBUMIN 2.5* 3.1* 3.2*   Recent Labs    04/14/17 0700 04/17/17 0330 04/20/17 0745  WBC 15.1* 9.5 11.6*  NEUTROABS 11.6* 7.0 9.2*  HGB 13.6 11.9* 11.5*  HCT 44.2 40.2 38.4  MCV 88.2 88.9 87.1  PLT 222 149* 182   Lab Results  Component Value Date   TSH 2.992 07/26/2016   Lab Results  Component Value Date   HGBA1C 7.6 (H) 04/02/2017   No results found for: CHOL, HDL, LDLCALC, LDLDIRECT, TRIG, CHOLHDL  Significant Diagnostic Results in last 30 days:  Dg Chest 1 View  Result Date: 04/10/2017 CLINICAL DATA:  Patient fell out of bed.  Low back pain. EXAM: CHEST 1 VIEW COMPARISON:  03/31/2017 FINDINGS: Mild cardiac enlargement. No vascular congestion or edema. Interstitial fibrosis in the lungs. There is interval development of superimposed airspace disease in the right mid lung likely representing developing pneumonia. Aspiration or contusion could also potentially have this appearance. Calcified and tortuous aorta. Degenerative changes in the shoulders. IMPRESSION: Cardiac enlargement. Interstitial fibrosis in the lungs. Developing airspace disease in the right mid lung likely represent pneumonia. Aspiration or contusion could also have this appearance. Electronically Signed   By: Lucienne Capers M.D.   On: 04/10/2017 06:41   Dg Chest 2 View  Result Date:  03/31/2017 CLINICAL DATA:  Cough. EXAM: CHEST  2 VIEW COMPARISON:  07/26/2016 and prior radiographs FINDINGS: Cardiomegaly again noted. Bibasilar opacities are present-question atelectasis versus pneumonia. No pleural effusion or pneumothorax. No acute bony abnormalities are noted. IMPRESSION: Bibasilar opacities-question atelectasis versus pneumonia. Cardiomegaly. Electronically Signed   By: Margarette Canada M.D.   On: 03/31/2017 19:34   Dg Lumbar Spine Complete  Result Date: 04/10/2017 CLINICAL DATA:   Patient fell out of bed.  Low back pain. EXAM: LUMBAR SPINE - COMPLETE 4+ VIEW COMPARISON:  10/25/2016 FINDINGS: Mild lumbar scoliosis convex towards the right. Degenerative changes throughout the lumbar spine with narrowed interspaces and endplate hypertrophic changes. Interval anterior compression of L1 since previous study. Given the history of trauma, this could represent acute compression fracture. No retropulsion of fracture fragments. Degenerative disc disease in the lower lumbar spine. Mild compression of L4 is unchanged. Vascular calcifications throughout the aorta. IMPRESSION: Anterior compression of L1, new since previous study and likely acute. This represents approximately 25% loss of height. Diffuse degenerative change throughout the lumbar spine. Aortic atherosclerosis. Electronically Signed   By: Lucienne Capers M.D.   On: 04/10/2017 06:43   Dg Pelvis 1-2 Views  Result Date: 04/10/2017 CLINICAL DATA:  Patient fell out of bed.  Low back pain. EXAM: PELVIS - 1-2 VIEW COMPARISON:  CT abdomen and pelvis 07/26/2016. FINDINGS: Degenerative changes in the lower lumbar spine and hips. No evidence of acute fracture or dislocation in the pelvis or hips. SI joints and symphysis pubis are not displaced. No focal bone lesion or bone destruction. IMPRESSION: Degenerative changes in the lower lumbar spine and both hips. No acute bony abnormalities. Electronically Signed   By: Lucienne Capers M.D.   On: 04/10/2017 06:40   Ct Head Wo Contrast  Result Date: 04/10/2017 CLINICAL DATA:  84 year old female fell out of bed. Complaining of lower mid back pain. Does not recall hitting head. Headaches and bilateral neck stiffness. Initial encounter. EXAM: CT HEAD WITHOUT CONTRAST CT CERVICAL SPINE WITHOUT CONTRAST TECHNIQUE: Multidetector CT imaging of the head and cervical spine was performed following the standard protocol without intravenous contrast. Multiplanar CT image reconstructions of the cervical spine  were also generated. COMPARISON:  07/16/2016 CT head and cervical spine. FINDINGS: CT HEAD FINDINGS Brain: No intracranial hemorrhage or CT evidence of large acute infarct. Chronic microvascular changes. Focal fat anterior falx otherwise, no intracranial mass lesion noted on this unenhanced exam. Vascular: Ectatic basilar artery.  Calcified carotid arteries. Skull: No skull fracture Sinuses/Orbits: Chronic nonspecific soft tissue swelling inferior medial aspect of the left globe. Partial opacification left frontal sinus. Other: Mastoid air cells and middle ear cavities are clear. CT CERVICAL SPINE FINDINGS Alignment: Minimal curvature convex left. Minimal anterior slip C3 and C4 and minimal retrolisthesis C5 unchanged from prior exam. Skull base and vertebrae: Evaluation slightly limited by artifact from patient's shoulders/habitus. No acute cervical spine fracture noted. Soft tissues and spinal canal: No abnormal prevertebral soft tissue swelling. Disc levels:  Evaluation limited by habitus. Upper chest: Bilateral upper lobe airspace disease greater on right. Please see chest x-ray report from same date. Other: Left thyroid 1.3 centimeter nodule and right thyroid 6 millimeter nodule incidentally noted. Carotid bifurcation calcifications. IMPRESSION: No skull fracture or intracranial hemorrhage. No cervical spine fracture or abnormal prevertebral soft tissue swelling. Alignment similar to prior exam. Evaluation slightly limited by patient's habitus/shoulder artifact. Bilateral upper lobe airspace disease greater on right. Please see chest x-ray report from same date. Electronically Signed   By:  Genia Del M.D.   On: 04/10/2017 07:10   Ct Cervical Spine Wo Contrast  Result Date: 04/10/2017 CLINICAL DATA:  84 year old female fell out of bed. Complaining of lower mid back pain. Does not recall hitting head. Headaches and bilateral neck stiffness. Initial encounter. EXAM: CT HEAD WITHOUT CONTRAST CT CERVICAL  SPINE WITHOUT CONTRAST TECHNIQUE: Multidetector CT imaging of the head and cervical spine was performed following the standard protocol without intravenous contrast. Multiplanar CT image reconstructions of the cervical spine were also generated. COMPARISON:  07/16/2016 CT head and cervical spine. FINDINGS: CT HEAD FINDINGS Brain: No intracranial hemorrhage or CT evidence of large acute infarct. Chronic microvascular changes. Focal fat anterior falx otherwise, no intracranial mass lesion noted on this unenhanced exam. Vascular: Ectatic basilar artery.  Calcified carotid arteries. Skull: No skull fracture Sinuses/Orbits: Chronic nonspecific soft tissue swelling inferior medial aspect of the left globe. Partial opacification left frontal sinus. Other: Mastoid air cells and middle ear cavities are clear. CT CERVICAL SPINE FINDINGS Alignment: Minimal curvature convex left. Minimal anterior slip C3 and C4 and minimal retrolisthesis C5 unchanged from prior exam. Skull base and vertebrae: Evaluation slightly limited by artifact from patient's shoulders/habitus. No acute cervical spine fracture noted. Soft tissues and spinal canal: No abnormal prevertebral soft tissue swelling. Disc levels:  Evaluation limited by habitus. Upper chest: Bilateral upper lobe airspace disease greater on right. Please see chest x-ray report from same date. Other: Left thyroid 1.3 centimeter nodule and right thyroid 6 millimeter nodule incidentally noted. Carotid bifurcation calcifications. IMPRESSION: No skull fracture or intracranial hemorrhage. No cervical spine fracture or abnormal prevertebral soft tissue swelling. Alignment similar to prior exam. Evaluation slightly limited by patient's habitus/shoulder artifact. Bilateral upper lobe airspace disease greater on right. Please see chest x-ray report from same date. Electronically Signed   By: Genia Del M.D.   On: 04/10/2017 07:10    Assessment/Plan RUL pneumonia Patient was recently  finished a course of doxycycline. She was found to be more confused by therapy.  Repeat  blood count has shown increase in her white count.  She continues to have cough and wheezing.  Chest x-ray shows right upper lobe infiltrate. We will start her on Augmentin   COPD exacerbation We will start her on DuoNeb twice daily Low-dose prednisone  Atrial fibrillation Rate controlled on diltiazem On Eliquis Have to reconsider if patient continues to have falls  Essential hypertension Blood pressure controlled  Rheumatoid arthritis On chronic methotrexate Diabetes type 2 On Tradjenta BS around 200 or less in the morning.   Closed compression fracture of L1 lumbar vertebra but On Tylenol PRN. Oxycodone and Ativan discontinued due to Confusion.  Dementia with falls Patient working with therapy.  Stays high risk for fall  Hyperlipidemia On Crestor Family/ staff Communication:   Labs/tests ordered:   Total time spent in this patient care encounter was 45_ minutes; greater than 50% of the visit spent counseling patient, reviewing records , Labs and coordinating care for problems addressed at this encounter.

## 2017-04-24 ENCOUNTER — Encounter: Payer: Self-pay | Admitting: Internal Medicine

## 2017-04-24 ENCOUNTER — Non-Acute Institutional Stay (SKILLED_NURSING_FACILITY): Payer: Medicare Other | Admitting: Internal Medicine

## 2017-04-24 ENCOUNTER — Encounter (HOSPITAL_COMMUNITY)
Admission: RE | Admit: 2017-04-24 | Discharge: 2017-04-24 | Disposition: A | Discharge status: Home / Self Care | Payer: Medicare Other | Source: Skilled Nursing Facility | Attending: *Deleted | Admitting: *Deleted

## 2017-04-24 DIAGNOSIS — J441 Chronic obstructive pulmonary disease with (acute) exacerbation: Secondary | ICD-10-CM | POA: Diagnosis not present

## 2017-04-24 DIAGNOSIS — I10 Essential (primary) hypertension: Secondary | ICD-10-CM

## 2017-04-24 DIAGNOSIS — S32010D Wedge compression fracture of first lumbar vertebra, subsequent encounter for fracture with routine healing: Secondary | ICD-10-CM | POA: Diagnosis not present

## 2017-04-24 DIAGNOSIS — M0520 Rheumatoid vasculitis with rheumatoid arthritis of unspecified site: Secondary | ICD-10-CM

## 2017-04-24 DIAGNOSIS — J189 Pneumonia, unspecified organism: Secondary | ICD-10-CM

## 2017-04-24 DIAGNOSIS — I4891 Unspecified atrial fibrillation: Secondary | ICD-10-CM | POA: Diagnosis not present

## 2017-04-24 DIAGNOSIS — F0390 Unspecified dementia without behavioral disturbance: Secondary | ICD-10-CM | POA: Diagnosis not present

## 2017-04-24 DIAGNOSIS — I00 Rheumatic fever without heart involvement: Secondary | ICD-10-CM | POA: Diagnosis not present

## 2017-04-24 LAB — CBC
HEMATOCRIT: 37.3 % (ref 36.0–46.0)
Hemoglobin: 11.2 g/dL — ABNORMAL LOW (ref 12.0–15.0)
MCH: 26.2 pg (ref 26.0–34.0)
MCHC: 30 g/dL (ref 30.0–36.0)
MCV: 87.1 fL (ref 78.0–100.0)
Platelets: 251 10*3/uL (ref 150–400)
RBC: 4.28 MIL/uL (ref 3.87–5.11)
RDW: 16.7 % — AB (ref 11.5–15.5)
WBC: 8.9 10*3/uL (ref 4.0–10.5)

## 2017-04-24 LAB — BASIC METABOLIC PANEL
ANION GAP: 9 (ref 5–15)
BUN: 22 mg/dL — ABNORMAL HIGH (ref 6–20)
CO2: 27 mmol/L (ref 22–32)
Calcium: 9.8 mg/dL (ref 8.9–10.3)
Chloride: 103 mmol/L (ref 101–111)
Creatinine, Ser: 1.28 mg/dL — ABNORMAL HIGH (ref 0.44–1.00)
GFR, EST AFRICAN AMERICAN: 43 mL/min — AB (ref >60)
GFR, EST NON AFRICAN AMERICAN: 37 mL/min — AB (ref >60)
Glucose, Bld: 201 mg/dL — ABNORMAL HIGH (ref 65–99)
Potassium: 3.8 mmol/L (ref 3.5–5.1)
SODIUM: 139 mmol/L (ref 135–145)

## 2017-04-24 NOTE — Progress Notes (Signed)
Location:   [facility] Room Number: 151/P Place of Service:  SNF (31) Provider:  Darrold Span, MD  Patient Care Team: Celene Squibb, MD as PCP - General  Extended Emergency Contact Information Primary Emergency Contact: Nance,Sheila Address: [address]          [place], [place] [address] [place] Phone: [phone] Relation: Daughter Secondary Emergency Contact: Charlynne Pander, [place] [address] [place] Phone: [phone] Relation: Grandaughter  Code Status:  DNR Goals of care: Advanced Directive information Advanced Directives 04/24/2017  Does Patient Have a Medical Advance Directive? Yes  Type of Advance Directive Out of facility DNR (pink MOST or yellow form)  Does patient want to make changes to medical advance directive? No - Patient declined  Copy of [place] in Chart? No - copy requested  Would patient like information on creating a medical advance directive? No - Patient declined  Pre-existing out of facility DNR order (yellow form or pink MOST form) -     Chief Complaint  Patient presents with  . Acute Visit    Follow up of Pneumonia    HPI:  Pt is a 84 y.o. female seen today for an acute visit for Follow up of Pneumonia.  Patient has h/o COPD, Chronic Atrial Fibrillation on Eliquis, Rheumatoid arthritis, Fibromyalgia, Peripheral Neuropathy, Hyperlipidemia, PUD, and anxiety disorder,.  Patient was initially admitted to SNF for therapy after being in the hospital for pneumonia and fall.. Since she continued to have confusion and hallucinations with productive cough.  She had a repeat chest x-ray which showed acute right upper lobe pneumonia.  She also had shortness of breath with wheezing.  She was started on Augmentin and prednisone. Patient's white count has normalized.  She continues to be afebrile.  Per nurses and therapy patient is less confused.   She  continues to have cough that is mostly dry.  She was also taken off her Percocet due to confusion.  Since then patient has been complaining of lower back pain.  She did have a acute L1 compression fracture with her previous fall. Patient was only complaining of pain in her lower back.  She was also complaining of cough.  She continues to have baseline confusion  Past Medical History:  Diagnosis Date  . Anemia   . Atrial fibrillation ([place])   . Back pain   . Bacterial pneumonia   . Cancer (H[facility])    Skin   . Chronic respiratory failure ([facility])   . COPD (chronic obstructive pulmonary disease) ([place])   . Dementia   . Depression   . Diverticulosis   . Diverticulosis   . Fatty liver   . Fibromyalgia   . Fibromyalgia   . Gastric polyp 09/08/11   at the anastomosis-inflammatory  . Generalized anxiety disorder   . Generalized anxiety disorder   . GERD (gastroesophageal reflux disease)   . 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 Hypercholesteremia   . Hypertension   . Insomnia   . Internal hemorrhoids   . On home O2    qhs  . Osteoporosis   . Rheumatoid arteritis   . Ulcer    stomach  . Vitamin D deficiency    Past Surgical History:  Procedure Laterality Date  . ABDOMINAL HYSTERECTOMY    . ABDOMINAL SURGERY     removed patial stomach from ulcer  .  COLONOSCOPY  04-2001   internal hemorrhoids, panocolonic diverticulosis, and colon polyps   . UPPER GASTROINTESTINAL ENDOSCOPY      Allergies  Allergen Reactions  . Other     Band Aids   . Tape   . Lipitor [Atorvastatin Calcium] Rash and Other (See Comments)    Muscle pain  . Niaspan [Niacin Er] Rash and Other (See Comments)    Muscle pain    Outpatient Encounter Medications as of 04/24/2017  Medication Sig  . acetaminophen (TYLENOL) 325 MG tablet Take 650 mg by mouth every 8 (eight) hours as needed.  Marland Kitchen albuterol (PROAIR HFA) 108 (90 BASE) MCG/ACT inhaler Inhale 2 puffs into the lungs every 6 (six) hours as needed for  wheezing or shortness of breath. For rescue   . amoxicillin-clavulanate (AUGMENTIN) 500-125 MG tablet Take 1 tablet by mouth 3 (three) times daily.  Marland Kitchen apixaban (ELIQUIS) 2.5 MG TABS tablet Take 2.5 mg by mouth 2 (two) times daily.  Roseanne Kaufman [place]-Castor Oil (VENELEX) OINT Apply to sacrum and bilateral buttocks q shift and prn for prevention  . CARTIA XT 300 MG 24 hr capsule TAKE ONE CAPSULE BY MOUTH EVERY DAY  . donepezil (ARICEPT) 5 MG tablet Take 5 mg by mouth at bedtime.  . DULoxetine (CYMBALTA) 60 MG capsule Take 60 mg by mouth daily.    Marland Kitchen guaifenesin (ROBITUSSIN) 100 MG/5ML syrup Take 200 mg by mouth 4 (four) times daily as needed for cough.  Marland Kitchen ipratropium-albuterol (DUONEB) 0.5-2.5 (3) MG/3ML SOLN Take 3 mLs by nebulization every 6 (six) hours.  Marland Kitchen linagliptin (TRADJENTA) 5 MG TABS tablet Take 1 tablet (5 mg total) by mouth daily.  Marland Kitchen LYRICA 100 MG capsule Take 100 mg by mouth 3 (three) times daily.  . methotrexate 2.5 MG tablet Take 12.5 mg by mouth. On Thursday at noon.  . Multiple Vitamin (DAILY VITE PO) Take 1 tablet by mouth daily.  . pantoprazole (PROTONIX) 40 MG tablet Take 1 tablet (40 mg total) by mouth daily.  . phenylephrine-shark liver oil-mineral oil-petrolatum (PREPARATION H) 0.25-3-14-71.9 % rectal ointment Place 1 application rectally 2 (two) times daily as needed for hemorrhoids.  . polyethylene glycol (MIRALAX / GLYCOLAX) packet Take 17 g by mouth daily as needed for moderate constipation.  . predniSONE (DELTASONE) 20 MG tablet Take 20 mg by mouth daily with breakfast.  . raloxifene (EVISTA) 60 MG tablet Take 60 mg by mouth daily.  . rosuvastatin (CRESTOR) 20 MG tablet Take 20 mg by mouth daily.  . [DISCONTINUED] budesonide-formoterol (SYMBICORT) 80-4.5 MCG/ACT inhaler Inhale 2 puffs into the lungs 2 (two) times daily.   No facility-administered encounter medications on file as of 04/24/2017.      Review of Systems  Constitutional: Positive for activity change and  fatigue. Negative for fever.  HENT: Negative.   Respiratory: Positive for cough. Negative for shortness of breath.   Cardiovascular: Negative.   Gastrointestinal: Negative.   Genitourinary: Negative.   Musculoskeletal: Positive for back pain.  Skin: Negative.   Neurological: Positive for weakness.  Psychiatric/Behavioral: Negative.     Immunization History  Administered Date(s) Administered  . Tdap 10/21/2016   Pertinent  Health Maintenance Due  Topic Date Due  . INFLUENZA VACCINE  05/12/2017 (Originally 10/12/2016)  . DEXA SCAN  05/12/2017 (Originally 06/06/1995)  . PNA vac Low Risk Adult (1 of 2 - PCV13) 05/12/2017 (Originally 06/06/1995)  . FOOT EXAM  05/22/2017 (Originally 06/05/1938)  . OPHTHALMOLOGY EXAM  05/22/2017 (Originally 06/05/1938)  . URINE MICROALBUMIN  05/22/2017 (Originally  06/05/1938)  . HEMOGLOBIN A1C  09/30/2017   No flowsheet data found. Functional Status Survey:    Vitals:   04/24/17 1453  BP: 127/84  Pulse: 77  Resp: 16  Temp: 98.4 F (36.9 C)  TempSrc: Oral  SpO2: 92%   There is no height or weight on file to calculate BMI. Physical Exam  Constitutional: She appears well-developed and well-nourished.  HENT:  Head: Normocephalic.  Mouth/Throat: Oropharynx is clear and moist.  Eyes: Pupils are equal, round, and reactive to light.  Neck: Neck supple.  Cardiovascular: Normal rate and normal heart sounds.  Pulmonary/Chest: Effort normal. She has rales.  Abdominal: Soft. Bowel sounds are normal. She exhibits no distension. There is no tenderness. There is no rebound.  Musculoskeletal:  Mild edema Bilateral  Neurological: She is alert.  Not oriented.But answers  appropriate and follows command  Skin: Skin is warm and dry.  Psychiatric: She has a normal mood and affect. Her behavior is normal.    Labs reviewed: Recent Labs    03/31/17 1852  04/02/17 0627  04/10/17 0943  04/17/17 0330 04/20/17 0745 04/24/17 0700  NA 142   < > 140   < >  --     < > 137 136 139  K 3.5   < > 4.1   < >  --    < > 3.7 3.5 3.8  CL 102   < > 102   < >  --    < > 100* 100* 103  CO2 29   < > 28   < >  --    < > 27 26 27   GLUCOSE 165*   < > 241*   < >  --    < > 190* 235* 201*  BUN 17   < > 27*   < >  --    < > 19 17 22*  CREATININE 1.29*   < > 1.35*   < >  --    < > 1.44* 1.42* 1.28*  CALCIUM 9.7   < > 10.4*   < >  --    < > 9.7 9.4 9.8  MG 2.0  --  2.1  --  1.8  --   --   --   --   PHOS 3.0  --   --   --   --   --   --   --   --    < > = values in this interval not displayed.   Recent Labs    07/27/16 0509 10/25/16 1924 03/31/17 1852  AST 20 47* 30  ALT 15 39 17  ALKPHOS 52 75 70  BILITOT 0.6 0.6 0.7  PROT 5.3* 6.4* 6.9  ALBUMIN 2.5* 3.1* 3.2*   Recent Labs    04/14/17 0700 04/17/17 0330 04/20/17 0745 04/24/17 0700  WBC 15.1* 9.5 11.6* 8.9  NEUTROABS 11.6* 7.0 9.2*  --   HGB 13.6 11.9* 11.5* 11.2*  HCT 44.2 40.2 38.4 37.3  MCV 88.2 88.9 87.1 87.1  PLT 222 149* 182 251   Lab Results  Component Value Date   TSH 2.992 07/26/2016   Lab Results  Component Value Date   HGBA1C 7.6 (H) 04/02/2017   No results found for: CHOL, HDL, LDLCALC, LDLDIRECT, TRIG, CHOLHDL  Significant Diagnostic Results in last 30 days:  Dg Chest 1 View  Result Date: 04/10/2017 CLINICAL DATA:  Patient fell out of bed.  Low back pain. EXAM: CHEST 1 VIEW COMPARISON:  03/31/2017 FINDINGS: Mild cardiac enlargement. No vascular congestion or edema. Interstitial fibrosis in the lungs. There is interval development of superimposed airspace disease in the right mid lung likely representing developing pneumonia. Aspiration or contusion could also potentially have this appearance. Calcified and tortuous aorta. Degenerative changes in the shoulders. IMPRESSION: Cardiac enlargement. Interstitial fibrosis in the lungs. Developing airspace disease in the right mid lung likely represent pneumonia. Aspiration or contusion could also have this appearance. Electronically Signed    By: Lucienne Capers M.D.   On: 04/10/2017 06:41   Dg Chest 2 View  Result Date: 03/31/2017 CLINICAL DATA:  Cough. EXAM: CHEST  2 VIEW COMPARISON:  07/26/2016 and prior radiographs FINDINGS: Cardiomegaly again noted. Bibasilar opacities are present-question atelectasis versus pneumonia. No pleural effusion or pneumothorax. No acute bony abnormalities are noted. IMPRESSION: Bibasilar opacities-question atelectasis versus pneumonia. Cardiomegaly. Electronically Signed   By: Margarette Canada M.D.   On: 03/31/2017 19:34   Dg Lumbar Spine Complete  Result Date: 04/10/2017 CLINICAL DATA:  Patient fell out of bed.  Low back pain. EXAM: LUMBAR SPINE - COMPLETE 4+ VIEW COMPARISON:  10/25/2016 FINDINGS: Mild lumbar scoliosis convex towards the right. Degenerative changes throughout the lumbar spine with narrowed interspaces and endplate hypertrophic changes. Interval anterior compression of L1 since previous study. Given the history of trauma, this could represent acute compression fracture. No retropulsion of fracture fragments. Degenerative disc disease in the lower lumbar spine. Mild compression of L4 is unchanged. Vascular calcifications throughout the aorta. IMPRESSION: Anterior compression of L1, new since previous study and likely acute. This represents approximately 25% loss of height. Diffuse degenerative change throughout the lumbar spine. Aortic atherosclerosis. Electronically Signed   By: Lucienne Capers M.D.   On: 04/10/2017 06:43   Dg Pelvis 1-2 Views  Result Date: 04/10/2017 CLINICAL DATA:  Patient fell out of bed.  Low back pain. EXAM: PELVIS - 1-2 VIEW COMPARISON:  CT abdomen and pelvis 07/26/2016. FINDINGS: Degenerative changes in the lower lumbar spine and hips. No evidence of acute fracture or dislocation in the pelvis or hips. SI joints and symphysis pubis are not displaced. No focal bone lesion or bone destruction. IMPRESSION: Degenerative changes in the lower lumbar spine and both hips. No acute  bony abnormalities. Electronically Signed   By: Lucienne Capers M.D.   On: 04/10/2017 06:40   Ct Head Wo Contrast  Result Date: 04/10/2017 CLINICAL DATA:  84 year old female fell out of bed. Complaining of lower mid back pain. Does not recall hitting head. Headaches and bilateral neck stiffness. Initial encounter. EXAM: CT HEAD WITHOUT CONTRAST CT CERVICAL SPINE WITHOUT CONTRAST TECHNIQUE: Multidetector CT imaging of the head and cervical spine was performed following the standard protocol without intravenous contrast. Multiplanar CT image reconstructions of the cervical spine were also generated. COMPARISON:  07/16/2016 CT head and cervical spine. FINDINGS: CT HEAD FINDINGS Brain: No intracranial hemorrhage or CT evidence of large acute infarct. Chronic microvascular changes. Focal fat anterior falx otherwise, no intracranial mass lesion noted on this unenhanced exam. Vascular: Ectatic basilar artery.  Calcified carotid arteries. Skull: No skull fracture Sinuses/Orbits: Chronic nonspecific soft tissue swelling inferior medial aspect of the left globe. Partial opacification left frontal sinus. Other: Mastoid air cells and middle ear cavities are clear. CT CERVICAL SPINE FINDINGS Alignment: Minimal curvature convex left. Minimal anterior slip C3 and C4 and minimal retrolisthesis C5 unchanged from prior exam. Skull base and vertebrae: Evaluation slightly limited by artifact from patient's shoulders/habitus. No acute cervical spine fracture noted. Soft tissues and  spinal canal: No abnormal prevertebral soft tissue swelling. Disc levels:  Evaluation limited by habitus. Upper chest: Bilateral upper lobe airspace disease greater on right. Please see chest x-ray report from same date. Other: Left thyroid 1.3 centimeter nodule and right thyroid 6 millimeter nodule incidentally noted. Carotid bifurcation calcifications. IMPRESSION: No skull fracture or intracranial hemorrhage. No cervical spine fracture or abnormal  prevertebral soft tissue swelling. Alignment similar to prior exam. Evaluation slightly limited by patient's habitus/shoulder artifact. Bilateral upper lobe airspace disease greater on right. Please see chest x-ray report from same date. Electronically Signed   By: Genia Del M.D.   On: 04/10/2017 07:10   Ct Cervical Spine Wo Contrast  Result Date: 04/10/2017 CLINICAL DATA:  84 year old female fell out of bed. Complaining of lower mid back pain. Does not recall hitting head. Headaches and bilateral neck stiffness. Initial encounter. EXAM: CT HEAD WITHOUT CONTRAST CT CERVICAL SPINE WITHOUT CONTRAST TECHNIQUE: Multidetector CT imaging of the head and cervical spine was performed following the standard protocol without intravenous contrast. Multiplanar CT image reconstructions of the cervical spine were also generated. COMPARISON:  07/16/2016 CT head and cervical spine. FINDINGS: CT HEAD FINDINGS Brain: No intracranial hemorrhage or CT evidence of large acute infarct. Chronic microvascular changes. Focal fat anterior falx otherwise, no intracranial mass lesion noted on this unenhanced exam. Vascular: Ectatic basilar artery.  Calcified carotid arteries. Skull: No skull fracture Sinuses/Orbits: Chronic nonspecific soft tissue swelling inferior medial aspect of the left globe. Partial opacification left frontal sinus. Other: Mastoid air cells and middle ear cavities are clear. CT CERVICAL SPINE FINDINGS Alignment: Minimal curvature convex left. Minimal anterior slip C3 and C4 and minimal retrolisthesis C5 unchanged from prior exam. Skull base and vertebrae: Evaluation slightly limited by artifact from patient's shoulders/habitus. No acute cervical spine fracture noted. Soft tissues and spinal canal: No abnormal prevertebral soft tissue swelling. Disc levels:  Evaluation limited by habitus. Upper chest: Bilateral upper lobe airspace disease greater on right. Please see chest x-ray report from same date. Other: Left  thyroid 1.3 centimeter nodule and right thyroid 6 millimeter nodule incidentally noted. Carotid bifurcation calcifications. IMPRESSION: No skull fracture or intracranial hemorrhage. No cervical spine fracture or abnormal prevertebral soft tissue swelling. Alignment similar to prior exam. Evaluation slightly limited by patient's habitus/shoulder artifact. Bilateral upper lobe airspace disease greater on right. Please see chest x-ray report from same date. Electronically Signed   By: Genia Del M.D.   On: 04/10/2017 07:10    Assessment/Plan  Right upper lobe pneumonia Patient doing well on Augmentin.  She is less confused and her white count has normalized.  COPD Continue on prednisone Will increase her DuoNeb 3 times a day for few days Atrial fibrillation Rate controlled on diltiazem On Eliquis Have to reconsider if patient continues to have falls Essential hypertension Blood pressure controlled Rheumatoid arthritis On chronic methotrexate Diabetes type 2 On Tradjenta Blood sugars running slightly above 200 due to prednisone Close compression fracture of L1 lumbar vertebrae Will restart patient on Percocet every 8 hours as needed for pain Discussed with therapy patient has been complaining of  Pain and not able to do therapy  Dementia with falls Patient working with therapy.  There is high risk for fall I think patient dementia has worsened and she would not do very well with therapy.  Patient would not be able to go back to her assisted living facility .   Hyperlipidemia On Crestor   Family/ staff Communication:   Labs/tests ordered:

## 2017-04-25 ENCOUNTER — Encounter: Payer: Self-pay | Admitting: Internal Medicine

## 2017-04-25 ENCOUNTER — Non-Acute Institutional Stay (SKILLED_NURSING_FACILITY): Payer: Medicare Other | Admitting: Internal Medicine

## 2017-04-25 DIAGNOSIS — J449 Chronic obstructive pulmonary disease, unspecified: Secondary | ICD-10-CM | POA: Diagnosis not present

## 2017-04-25 DIAGNOSIS — J189 Pneumonia, unspecified organism: Secondary | ICD-10-CM | POA: Diagnosis not present

## 2017-04-25 DIAGNOSIS — I4891 Unspecified atrial fibrillation: Secondary | ICD-10-CM | POA: Diagnosis not present

## 2017-04-25 DIAGNOSIS — I10 Essential (primary) hypertension: Secondary | ICD-10-CM

## 2017-04-25 NOTE — Progress Notes (Signed)
Location:   [facility] Room Number: 151/P Place of Service:  SNF (31) Provider:  Darrold Span, MD  Patient Care Team: Celene Squibb, MD as PCP - General  Extended Emergency Contact Information Primary Emergency Contact: Nance,Sheila Address: [address]          [place], [place] [address] [place] Phone: [phone] Relation: Daughter Secondary Emergency Contact: Charlynne Pander, [place] [address] [place] Phone: [phone] Relation: Grandaughter  Code Status:  DNR Goals of care: Advanced Directive information Advanced Directives 04/25/2017  Does Patient Have a Medical Advance Directive? Yes  Type of Advance Directive Out of facility DNR (pink MOST or yellow form)  Does patient want to make changes to medical advance directive? No - Patient declined  Copy of [place] in Chart? No - copy requested  Would patient like information on creating a medical advance directive? No - Patient declined  Pre-existing out of facility DNR order (yellow form or pink MOST form) -     Chief Complaint  Patient presents with  . Acute Visit    Patients c/o Weight Gain    HPI:  Pt is a 84 y.o. female seen today for an acute visit for Continuous SOB and Cough with Weight gain now. Patient has h/o COPD, Chronic Atrial Fibrillation on Eliquis, Rheumatoid arthritis, Fibromyalgia, Peripheral Neuropathy, Hyperlipidemia, PUD, and anxiety disorder,.  Patient was initially admitted to SNF for therapy after being in the hospital for pneumonia and fall.. Since she continued to have confusion and hallucinations with productive cough.  She had a repeat chest x-ray which showed acute right upper lobe pneumonia.  She also had shortness of breath with wheezing.  She was started on Augmentin and prednisone.Patient's white count has normalized Patient is seen today at the family and the nurses were  concerned that she still continues to have cough and some shortness of breath.  Her weight is almost 7 pounds to 208 lbs. since admission. Patient continues to have cough.  No chest pain or fever.  She continues to have a baseline confusion   Past Medical History:  Diagnosis Date  . Anemia   . Atrial fibrillation ([facility])   . Back pain   . Bacterial pneumonia   . Cancer ([facility])    Skin   . Chronic respiratory failure ([facility])   . COPD (chronic obstructive pulmonary disease) ([facility])   . Dementia   . Depression   . Diverticulosis   . Diverticulosis   . Fatty liver   . Fibromyalgia   . Fibromyalgia   . Gastric polyp 09/08/11   at the anastomosis-inflammatory  . Generalized anxiety disorder   . Generalized anxiety disorder   . GERD (gastroesophageal reflux disease)   . 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 Hypercholesteremia   . Hypertension   . Insomnia   . Internal hemorrhoids   . On home O2    qhs  . Osteoporosis   . Rheumatoid arteritis   . Ulcer    stomach  . Vitamin D deficiency    Past Surgical History:  Procedure Laterality Date  . ABDOMINAL HYSTERECTOMY    . ABDOMINAL SURGERY     removed patial stomach from ulcer  . COLONOSCOPY  04-2001   internal hemorrhoids, panocolonic diverticulosis, and colon polyps   . UPPER GASTROINTESTINAL ENDOSCOPY      Allergies  Allergen Reactions  .  Other     Band Aids   . Tape   . Lipitor [Atorvastatin Calcium] Rash and Other (See Comments)    Muscle pain  . Niaspan [Niacin Er] Rash and Other (See Comments)    Muscle pain    Outpatient Encounter Medications as of 04/25/2017  Medication Sig  . acetaminophen (TYLENOL) 325 MG tablet Take 650 mg by mouth every 8 (eight) hours as needed.  Marland Kitchen albuterol (PROAIR HFA) 108 (90 BASE) MCG/ACT inhaler Inhale 2 puffs into the lungs every 6 (six) hours as needed for wheezing or shortness of breath. For rescue   . amoxicillin-clavulanate (AUGMENTIN) 500-125 MG tablet Take 1 tablet by mouth 3  (three) times daily.  Marland Kitchen apixaban (ELIQUIS) 2.5 MG TABS tablet Take 2.5 mg by mouth 2 (two) times daily.  Roseanne Kaufman [place]-Castor Oil (VENELEX) OINT Apply to sacrum and bilateral buttocks q shift and prn for prevention  . CARTIA XT 300 MG 24 hr capsule TAKE ONE CAPSULE BY MOUTH EVERY DAY  . donepezil (ARICEPT) 5 MG tablet Take 5 mg by mouth at bedtime.  . DULoxetine (CYMBALTA) 60 MG capsule Take 60 mg by mouth daily.    Marland Kitchen guaifenesin (ROBITUSSIN) 100 MG/5ML syrup Take 200 mg by mouth 4 (four) times daily as needed for cough.  Marland Kitchen HYDROcodone-acetaminophen (NORCO/VICODIN) 5-325 MG tablet Take 1 tablet by mouth every 8 (eight) hours as needed for moderate pain. Starting 04/24/2017  . HYDROcodone-acetaminophen (NORCO/VICODIN) 5-325 MG tablet Take 1 tablet by mouth every 8 (eight) hours as needed for moderate pain. Starting 04/24/2017  . ipratropium-albuterol (DUONEB) 0.5-2.5 (3) MG/3ML SOLN Take 3 mLs by nebulization every 6 (six) hours.  Marland Kitchen linagliptin (TRADJENTA) 5 MG TABS tablet Take 1 tablet (5 mg total) by mouth daily.  Marland Kitchen LYRICA 100 MG capsule Take 100 mg by mouth 3 (three) times daily.  . methotrexate 2.5 MG tablet Take 12.5 mg by mouth. On Thursday at noon.  . Multiple Vitamin (DAILY VITE PO) Take 1 tablet by mouth daily.  . pantoprazole (PROTONIX) 40 MG tablet Take 1 tablet (40 mg total) by mouth daily.  . phenylephrine-shark liver oil-mineral oil-petrolatum (PREPARATION H) 0.25-3-14-71.9 % rectal ointment Place 1 application rectally 2 (two) times daily as needed for hemorrhoids.  . polyethylene glycol (MIRALAX / GLYCOLAX) packet Take 17 g by mouth daily as needed for moderate constipation.  . predniSONE (DELTASONE) 20 MG tablet Take 40 mg by mouth daily with breakfast.   . raloxifene (EVISTA) 60 MG tablet Take 60 mg by mouth daily.  . rosuvastatin (CRESTOR) 20 MG tablet Take 20 mg by mouth daily.   No facility-administered encounter medications on file as of 04/25/2017.      Review of Systems    Constitutional: Positive for unexpected weight change.  Respiratory: Positive for cough.   Cardiovascular: Positive for leg swelling.  Gastrointestinal: Negative.   Genitourinary: Negative.   Musculoskeletal: Negative.   Neurological: Negative.   Psychiatric/Behavioral: Positive for confusion.    Immunization History  Administered Date(s) Administered  . Tdap 10/21/2016   Pertinent  Health Maintenance Due  Topic Date Due  . INFLUENZA VACCINE  05/12/2017 (Originally 10/12/2016)  . DEXA SCAN  05/12/2017 (Originally 06/06/1995)  . PNA vac Low Risk Adult (1 of 2 - PCV13) 05/12/2017 (Originally 06/06/1995)  . FOOT EXAM  05/22/2017 (Originally 06/05/1938)  . OPHTHALMOLOGY EXAM  05/22/2017 (Originally 06/05/1938)  . URINE MICROALBUMIN  05/22/2017 (Originally 06/05/1938)  . HEMOGLOBIN A1C  09/30/2017   No flowsheet data found. Functional Status Survey:  Vitals:   04/25/17 1515  BP: (!) 152/60  Pulse: 88  Resp: (!) 24  Temp: 98 F (36.7 C)  TempSrc: Oral  SpO2: 94%   There is no height or weight on file to calculate BMI. Physical Exam  Constitutional: She appears well-developed and well-nourished.  HENT:  Head: Normocephalic.  Mouth/Throat: Oropharynx is clear and moist.  Eyes: Pupils are equal, round, and reactive to light.  Neck: Neck supple.  Cardiovascular: Normal rate and normal heart sounds.  Pulmonary/Chest: Effort normal. She has rales.  Bilateral rales  Abdominal: Soft. Bowel sounds are normal. She exhibits no distension. There is no tenderness. There is no rebound.  Musculoskeletal:  Mild edema bilateral  Lymphadenopathy:    She has no cervical adenopathy.  Neurological: She is alert.  Skin: Skin is warm and dry.  Psychiatric: Her mood appears anxious.    Labs reviewed: Recent Labs    03/31/17 1852  04/02/17 0627  04/10/17 0943  04/17/17 0330 04/20/17 0745 04/24/17 0700  NA 142   < > 140   < >  --    < > 137 136 139  K 3.5   < > 4.1   < >  --    < >  3.7 3.5 3.8  CL 102   < > 102   < >  --    < > 100* 100* 103  CO2 29   < > 28   < >  --    < > 27 26 27   GLUCOSE 165*   < > 241*   < >  --    < > 190* 235* 201*  BUN 17   < > 27*   < >  --    < > 19 17 22*  CREATININE 1.29*   < > 1.35*   < >  --    < > 1.44* 1.42* 1.28*  CALCIUM 9.7   < > 10.4*   < >  --    < > 9.7 9.4 9.8  MG 2.0  --  2.1  --  1.8  --   --   --   --   PHOS 3.0  --   --   --   --   --   --   --   --    < > = values in this interval not displayed.   Recent Labs    07/27/16 0509 10/25/16 1924 03/31/17 1852  AST 20 47* 30  ALT 15 39 17  ALKPHOS 52 75 70  BILITOT 0.6 0.6 0.7  PROT 5.3* 6.4* 6.9  ALBUMIN 2.5* 3.1* 3.2*   Recent Labs    04/14/17 0700 04/17/17 0330 04/20/17 0745 04/24/17 0700  WBC 15.1* 9.5 11.6* 8.9  NEUTROABS 11.6* 7.0 9.2*  --   HGB 13.6 11.9* 11.5* 11.2*  HCT 44.2 40.2 38.4 37.3  MCV 88.2 88.9 87.1 87.1  PLT 222 149* 182 251   Lab Results  Component Value Date   TSH 2.992 07/26/2016   Lab Results  Component Value Date   HGBA1C 7.6 (H) 04/02/2017   No results found for: CHOL, HDL, LDLCALC, LDLDIRECT, TRIG, CHOLHDL  Significant Diagnostic Results in last 30 days:  Dg Chest 1 View  Result Date: 04/10/2017 CLINICAL DATA:  Patient fell out of bed.  Low back pain. EXAM: CHEST 1 VIEW COMPARISON:  03/31/2017 FINDINGS: Mild cardiac enlargement. No vascular congestion or edema. Interstitial fibrosis in the lungs. There is interval development of  superimposed airspace disease in the right mid lung likely representing developing pneumonia. Aspiration or contusion could also potentially have this appearance. Calcified and tortuous aorta. Degenerative changes in the shoulders. IMPRESSION: Cardiac enlargement. Interstitial fibrosis in the lungs. Developing airspace disease in the right mid lung likely represent pneumonia. Aspiration or contusion could also have this appearance. Electronically Signed   By: Lucienne Capers M.D.   On: 04/10/2017 06:41    Dg Chest 2 View  Result Date: 03/31/2017 CLINICAL DATA:  Cough. EXAM: CHEST  2 VIEW COMPARISON:  07/26/2016 and prior radiographs FINDINGS: Cardiomegaly again noted. Bibasilar opacities are present-question atelectasis versus pneumonia. No pleural effusion or pneumothorax. No acute bony abnormalities are noted. IMPRESSION: Bibasilar opacities-question atelectasis versus pneumonia. Cardiomegaly. Electronically Signed   By: Margarette Canada M.D.   On: 03/31/2017 19:34   Dg Lumbar Spine Complete  Result Date: 04/10/2017 CLINICAL DATA:  Patient fell out of bed.  Low back pain. EXAM: LUMBAR SPINE - COMPLETE 4+ VIEW COMPARISON:  10/25/2016 FINDINGS: Mild lumbar scoliosis convex towards the right. Degenerative changes throughout the lumbar spine with narrowed interspaces and endplate hypertrophic changes. Interval anterior compression of L1 since previous study. Given the history of trauma, this could represent acute compression fracture. No retropulsion of fracture fragments. Degenerative disc disease in the lower lumbar spine. Mild compression of L4 is unchanged. Vascular calcifications throughout the aorta. IMPRESSION: Anterior compression of L1, new since previous study and likely acute. This represents approximately 25% loss of height. Diffuse degenerative change throughout the lumbar spine. Aortic atherosclerosis. Electronically Signed   By: Lucienne Capers M.D.   On: 04/10/2017 06:43   Dg Pelvis 1-2 Views  Result Date: 04/10/2017 CLINICAL DATA:  Patient fell out of bed.  Low back pain. EXAM: PELVIS - 1-2 VIEW COMPARISON:  CT abdomen and pelvis 07/26/2016. FINDINGS: Degenerative changes in the lower lumbar spine and hips. No evidence of acute fracture or dislocation in the pelvis or hips. SI joints and symphysis pubis are not displaced. No focal bone lesion or bone destruction. IMPRESSION: Degenerative changes in the lower lumbar spine and both hips. No acute bony abnormalities. Electronically Signed   By:  Lucienne Capers M.D.   On: 04/10/2017 06:40   Ct Head Wo Contrast  Result Date: 04/10/2017 CLINICAL DATA:  84 year old female fell out of bed. Complaining of lower mid back pain. Does not recall hitting head. Headaches and bilateral neck stiffness. Initial encounter. EXAM: CT HEAD WITHOUT CONTRAST CT CERVICAL SPINE WITHOUT CONTRAST TECHNIQUE: Multidetector CT imaging of the head and cervical spine was performed following the standard protocol without intravenous contrast. Multiplanar CT image reconstructions of the cervical spine were also generated. COMPARISON:  07/16/2016 CT head and cervical spine. FINDINGS: CT HEAD FINDINGS Brain: No intracranial hemorrhage or CT evidence of large acute infarct. Chronic microvascular changes. Focal fat anterior falx otherwise, no intracranial mass lesion noted on this unenhanced exam. Vascular: Ectatic basilar artery.  Calcified carotid arteries. Skull: No skull fracture Sinuses/Orbits: Chronic nonspecific soft tissue swelling inferior medial aspect of the left globe. Partial opacification left frontal sinus. Other: Mastoid air cells and middle ear cavities are clear. CT CERVICAL SPINE FINDINGS Alignment: Minimal curvature convex left. Minimal anterior slip C3 and C4 and minimal retrolisthesis C5 unchanged from prior exam. Skull base and vertebrae: Evaluation slightly limited by artifact from patient's shoulders/habitus. No acute cervical spine fracture noted. Soft tissues and spinal canal: No abnormal prevertebral soft tissue swelling. Disc levels:  Evaluation limited by habitus. Upper chest: Bilateral upper lobe  airspace disease greater on right. Please see chest x-ray report from same date. Other: Left thyroid 1.3 centimeter nodule and right thyroid 6 millimeter nodule incidentally noted. Carotid bifurcation calcifications. IMPRESSION: No skull fracture or intracranial hemorrhage. No cervical spine fracture or abnormal prevertebral soft tissue swelling. Alignment similar  to prior exam. Evaluation slightly limited by patient's habitus/shoulder artifact. Bilateral upper lobe airspace disease greater on right. Please see chest x-ray report from same date. Electronically Signed   By: Genia Del M.D.   On: 04/10/2017 07:10   Ct Cervical Spine Wo Contrast  Result Date: 04/10/2017 CLINICAL DATA:  84 year old female fell out of bed. Complaining of lower mid back pain. Does not recall hitting head. Headaches and bilateral neck stiffness. Initial encounter. EXAM: CT HEAD WITHOUT CONTRAST CT CERVICAL SPINE WITHOUT CONTRAST TECHNIQUE: Multidetector CT imaging of the head and cervical spine was performed following the standard protocol without intravenous contrast. Multiplanar CT image reconstructions of the cervical spine were also generated. COMPARISON:  07/16/2016 CT head and cervical spine. FINDINGS: CT HEAD FINDINGS Brain: No intracranial hemorrhage or CT evidence of large acute infarct. Chronic microvascular changes. Focal fat anterior falx otherwise, no intracranial mass lesion noted on this unenhanced exam. Vascular: Ectatic basilar artery.  Calcified carotid arteries. Skull: No skull fracture Sinuses/Orbits: Chronic nonspecific soft tissue swelling inferior medial aspect of the left globe. Partial opacification left frontal sinus. Other: Mastoid air cells and middle ear cavities are clear. CT CERVICAL SPINE FINDINGS Alignment: Minimal curvature convex left. Minimal anterior slip C3 and C4 and minimal retrolisthesis C5 unchanged from prior exam. Skull base and vertebrae: Evaluation slightly limited by artifact from patient's shoulders/habitus. No acute cervical spine fracture noted. Soft tissues and spinal canal: No abnormal prevertebral soft tissue swelling. Disc levels:  Evaluation limited by habitus. Upper chest: Bilateral upper lobe airspace disease greater on right. Please see chest x-ray report from same date. Other: Left thyroid 1.3 centimeter nodule and right thyroid 6  millimeter nodule incidentally noted. Carotid bifurcation calcifications. IMPRESSION: No skull fracture or intracranial hemorrhage. No cervical spine fracture or abnormal prevertebral soft tissue swelling. Alignment similar to prior exam. Evaluation slightly limited by patient's habitus/shoulder artifact. Bilateral upper lobe airspace disease greater on right. Please see chest x-ray report from same date. Electronically Signed   By: Genia Del M.D.   On: 04/10/2017 07:10    Assessment/Plan  , Shortness of breath  ,Cough with recent Weight gain  on reviewing her chart patient has been on Lasix before Will start her on Lasix 40 mg daily for 3 days Then continue on 20 mg Repeat BMP Continue to follow weight Echo in 05/18 showed an EF of 60%  Right upper lobe pneumonia Continue on Augmentin COPD Continue on prednisone and duo nebs Atrial fibrillation Rate controlled on diltiazem On Eliquis Essential hypertension Blood pressure  mildly high today Lasix will help Rheumatoid arthritis On chronic methotrexate Diabetes type 2 On Tradjenta Blood sugars running slightly above 200 due to prednisone Close compression fracture of L1 lumbar vertebrae Had restarted patient on Percocet every 8 hours as needed for pain Discussed with therapy patient has been complaining of  Pain and not able to do therapy  Dementia with falls Patient working with therapy.  There is high risk for fall I think patient dementia has worsened and she would not do very well with therapy.  Patient most likely  would not be able to go back to her assisted living facility     Family/ staff Communication:  Labs/tests ordered:

## 2017-04-26 ENCOUNTER — Other Ambulatory Visit: Payer: Self-pay

## 2017-04-26 MED ORDER — LORAZEPAM 0.5 MG PO TABS: 0.2500 mg | ORAL_TABLET | Freq: Two times a day (BID) | ORAL | 0 refills | Status: DC

## 2017-04-26 NOTE — Telephone Encounter (Signed)
RX Fax for [facility]@ [phone]  

## 2017-04-28 ENCOUNTER — Non-Acute Institutional Stay (SKILLED_NURSING_FACILITY): Payer: Medicare Other | Admitting: Internal Medicine

## 2017-04-28 ENCOUNTER — Encounter: Payer: Self-pay | Admitting: Internal Medicine

## 2017-04-28 DIAGNOSIS — R635 Abnormal weight gain: Secondary | ICD-10-CM | POA: Diagnosis not present

## 2017-04-28 DIAGNOSIS — J189 Pneumonia, unspecified organism: Secondary | ICD-10-CM

## 2017-04-28 DIAGNOSIS — I4891 Unspecified atrial fibrillation: Secondary | ICD-10-CM | POA: Diagnosis not present

## 2017-04-28 NOTE — Progress Notes (Signed)
Location:    Nursing Home Room Number: 151/P Place of Service:  SNF (31) Provider:  Cory Roughen, MD  Patient Care Team: Celene Squibb, MD as PCP - General  Extended Emergency Contact Information Primary Emergency Contact: Nance,Sheila Address: [address]          [place], [place] [address] [place] Phone: [phone] Relation: Daughter Secondary Emergency Contact: Charlynne Pander, [place] [address] [place] Phone: [phone] Relation: Grandaughter   Goals of care: Advanced Directive information Advanced Directives 04/28/2017  Does Patient Have a Medical Advance Directive? Yes  Type of Advance Directive Out of facility DNR (pink MOST or yellow form)  Does patient want to make changes to medical advance directive? No - Patient declined  Copy of [place] in Chart? No - copy requested  Would patient like information on creating a medical advance directive? No - Patient declined  Pre-existing out of facility DNR order (yellow form or pink MOST form) -     Chief Complaint  Patient presents with  . Acute Visit    F/U Weight Gain    HPI:  Pt is a 84 y.o. female seen today for an acute visit for follow-up of weight gain and edema.  Patient has a history of COPD chronic atrial fibrillation as well as rheumatoid arthritis fibromyalgia hyperlipidemia peptic ulcer disease anxiety and peripheral neuropathy.  She was recently admitted to the hospital for pneumonia and a fall-she had a repeat chest x-ray which showed right upper lobe pneumonia- she was started on Augmentin and according to nursing this has improved.  She also had some weight gain and her Lasix was increased temporarily to 40 mg a day- previously been 20 mg a day- she was on the increased dose for 3 days and now is back on 20 mg a day- per nursing her edema appears to be improved-and weight today is actually down about 8 pounds from  what it was earlier this week. Currently patient has no acute complaints she does not complain of chest pain or shortness of breath has occasional cough which has improved per nursing -She has been afebrile vital signs have been stable she has a history of atrial fibrillation but this appears rate controlled on diltiazem    Past Medical History:  Diagnosis Date  . Anemia   . Atrial fibrillation ([place])   . Back pain   . Bacterial pneumonia   . Cancer (HCC)    Skin   . Chronic respiratory failure ([place])   . COPD (chronic obstructive pulmonary disease) ([place])   . Dementia   . Depression   . Diverticulosis   . Diverticulosis   . Fatty liver   . Fibromyalgia   . Fibromyalgia   . Gastric polyp 09/08/11   at the anastomosis-inflammatory  . Generalized anxiety disorder   . Generalized anxiety disorder   . GERD (gastroesophageal reflux disease)   . 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 Hypercholesteremia   . Hypertension   . Insomnia   . Internal hemorrhoids   . On home O2    qhs  . Osteoporosis   . Rheumatoid arteritis   . Ulcer    stomach  . Vitamin D deficiency    Past Surgical History:  Procedure Laterality Date  . ABDOMINAL HYSTERECTOMY    . ABDOMINAL SURGERY     removed patial stomach from ulcer  .  COLONOSCOPY  04-2001   internal hemorrhoids, panocolonic diverticulosis, and colon polyps   . UPPER GASTROINTESTINAL ENDOSCOPY      Allergies  Allergen Reactions  . Other     Band Aids   . Tape   . Lipitor [Atorvastatin Calcium] Rash and Other (See Comments)    Muscle pain  . Niaspan [Niacin Er] Rash and Other (See Comments)    Muscle pain    Outpatient Encounter Medications as of 04/28/2017  Medication Sig  . acetaminophen (TYLENOL) 325 MG tablet Take 650 mg by mouth every 8 (eight) hours as needed.  Marland Kitchen albuterol (PROAIR HFA) 108 (90 BASE) MCG/ACT inhaler Inhale 2 puffs into the lungs every 6 (six) hours as needed for wheezing or shortness of breath. For rescue    . amoxicillin-clavulanate (AUGMENTIN) 500-125 MG tablet Take 1 tablet by mouth 3 (three) times daily.  Marland Kitchen apixaban (ELIQUIS) 2.5 MG TABS tablet Take 2.5 mg by mouth 2 (two) times daily.  Roseanne Kaufman [place]-Castor Oil (VENELEX) OINT Apply to sacrum and bilateral buttocks q shift and prn for prevention  . CARTIA XT 300 MG 24 hr capsule TAKE ONE CAPSULE BY MOUTH EVERY DAY  . donepezil (ARICEPT) 5 MG tablet Take 5 mg by mouth at bedtime.  . DULoxetine (CYMBALTA) 60 MG capsule Take 60 mg by mouth daily.    . furosemide (LASIX) 20 MG tablet Take 20 mg by mouth daily.  Marland Kitchen guaifenesin (ROBITUSSIN) 100 MG/5ML syrup Take 200 mg by mouth 4 (four) times daily as needed for cough.  Marland Kitchen HYDROcodone-acetaminophen (NORCO/VICODIN) 5-325 MG tablet Take 1 tablet by mouth every 8 (eight) hours as needed for moderate pain. Starting 04/24/2017  . HYDROcodone-acetaminophen (NORCO/VICODIN) 5-325 MG tablet Take 1 tablet by mouth every 8 (eight) hours as needed for moderate pain. Starting 04/24/2017  . ipratropium-albuterol (DUONEB) 0.5-2.5 (3) MG/3ML SOLN Take 3 mLs by nebulization every 6 (six) hours.  Marland Kitchen linagliptin (TRADJENTA) 5 MG TABS tablet Take 1 tablet (5 mg total) by mouth daily.  Marland Kitchen LORazepam (ATIVAN) 0.5 MG tablet Take 0.5 tablets (0.25 mg total) by mouth 2 (two) times daily. Take for anxiety 14 days and to be reassessed  By MD.  . LYRICA 100 MG capsule Take 100 mg by mouth 3 (three) times daily.  . methotrexate 2.5 MG tablet Take 12.5 mg by mouth. On Thursday at noon.  . Multiple Vitamin (DAILY VITE PO) Take 1 tablet by mouth daily.  . pantoprazole (PROTONIX) 40 MG tablet Take 1 tablet (40 mg total) by mouth daily.  . phenylephrine-shark liver oil-mineral oil-petrolatum (PREPARATION H) 0.25-3-14-71.9 % rectal ointment Place 1 application rectally 2 (two) times daily as needed for hemorrhoids.  . polyethylene glycol (MIRALAX / GLYCOLAX) packet Take 17 g by mouth daily as needed for moderate constipation.  . raloxifene  (EVISTA) 60 MG tablet Take 60 mg by mouth daily.  . rosuvastatin (CRESTOR) 20 MG tablet Take 20 mg by mouth daily.  . [DISCONTINUED] predniSONE (DELTASONE) 20 MG tablet Take 40 mg by mouth daily with breakfast.    No facility-administered encounter medications on file as of 04/28/2017.     Review of Systems   This is limited secondary to patient being somewhat poor historian provided by nursing as well.  In general she not complaining of any fever chills she has lost some weight which was desired with edema.  Skin does not complain of rashes or itching.  Head ears eyes nose mouth and throat is not complaining of sore throat or visual  changes.  Respiratory recently treated for pneumonia has occasional cough but this has improved per nursing does not complain of shortness of breath today does have a history of COPD.  Cardiac is not complaining of chest pain or palpitations edema appears to have improved somewhat per nursing.  GI is not complaining of abdominal discomfort nausea vomiting diarrhea constipation.  Musculoskeletal has weakness but does not complain of joint pain currently.  Neurologic does not complain of dizziness headache or syncope.  Psych does not complain of overt depression or anxiety does appear to have some element of dementia with confusion  Immunization History  Administered Date(s) Administered  . Tdap 10/21/2016   Pertinent  Health Maintenance Due  Topic Date Due  . INFLUENZA VACCINE  05/12/2017 (Originally 10/12/2016)  . DEXA SCAN  05/12/2017 (Originally 06/06/1995)  . PNA vac Low Risk Adult (1 of 2 - PCV13) 05/12/2017 (Originally 06/06/1995)  . FOOT EXAM  05/22/2017 (Originally 06/05/1938)  . OPHTHALMOLOGY EXAM  05/22/2017 (Originally 06/05/1938)  . URINE MICROALBUMIN  05/22/2017 (Originally 06/05/1938)  . HEMOGLOBIN A1C  09/30/2017   No flowsheet data found. Functional Status Survey:    Vitals:   04/28/17 0916  BP: 96/68  Pulse: 85  Resp: 20  Temp:  98.4 F (36.9 C)  TempSrc: Oral  Of note manual blood pressure was 120/70 Weight today is 200 which is down about 8 pounds since earlier in the week Physical Exam  In general this is a pleasant elderly female in no distress lying comfortably in bed.  Her skin is warm and dry.  Eyes pupils appear reactive light sclera conjunctive are clear.  Oropharynx is clear mucous membranes moist.  Chest is clear to auscultation with reduced breath sounds there is no labored breathing.  Heart is irregular irregular rate and rhythm without murmur gallop or rub she has mild lower extremity edema according to nursing this is improved from earlier this week.  Abdomen is soft nontender with positive bowel sounds.  Musculoskeletal has some lower extremity weakness but moves all extremities at baseline.  Psych she is oriented to self can tell me what city she was in what states she is in and what month it is-she did struggle to name president of [place]-.       Labs reviewed: Recent Labs    03/31/17 1852  04/02/17 0627  04/10/17 0943  04/17/17 0330 04/20/17 0745 04/24/17 0700  NA 142   < > 140   < >  --    < > 137 136 139  K 3.5   < > 4.1   < >  --    < > 3.7 3.5 3.8  CL 102   < > 102   < >  --    < > 100* 100* 103  CO2 29   < > 28   < >  --    < > 27 26 27   GLUCOSE 165*   < > 241*   < >  --    < > 190* 235* 201*  BUN 17   < > 27*   < >  --    < > 19 17 22*  CREATININE 1.29*   < > 1.35*   < >  --    < > 1.44* 1.42* 1.28*  CALCIUM 9.7   < > 10.4*   < >  --    < > 9.7 9.4 9.8  MG 2.0  --  2.1  --  1.8  --   --   --   --  PHOS 3.0  --   --   --   --   --   --   --   --    < > = values in this interval not displayed.   Recent Labs    07/27/16 0509 10/25/16 1924 03/31/17 1852  AST 20 47* 30  ALT 15 39 17  ALKPHOS 52 75 70  BILITOT 0.6 0.6 0.7  PROT 5.3* 6.4* 6.9  ALBUMIN 2.5* 3.1* 3.2*   Recent Labs    04/14/17 0700 04/17/17 0330 04/20/17 0745 04/24/17 0700  WBC 15.1*  9.5 11.6* 8.9  NEUTROABS 11.6* 7.0 9.2*  --   HGB 13.6 11.9* 11.5* 11.2*  HCT 44.2 40.2 38.4 37.3  MCV 88.2 88.9 87.1 87.1  PLT 222 149* 182 251   Lab Results  Component Value Date   TSH 2.992 07/26/2016   Lab Results  Component Value Date   HGBA1C 7.6 (H) 04/02/2017   No results found for: CHOL, HDL, LDLCALC, LDLDIRECT, TRIG, CHOLHDL  Significant Diagnostic Results in last 30 days:  Dg Chest 1 View  Result Date: 04/10/2017 CLINICAL DATA:  Patient fell out of bed.  Low back pain. EXAM: CHEST 1 VIEW COMPARISON:  03/31/2017 FINDINGS: Mild cardiac enlargement. No vascular congestion or edema. Interstitial fibrosis in the lungs. There is interval development of superimposed airspace disease in the right mid lung likely representing developing pneumonia. Aspiration or contusion could also potentially have this appearance. Calcified and tortuous aorta. Degenerative changes in the shoulders. IMPRESSION: Cardiac enlargement. Interstitial fibrosis in the lungs. Developing airspace disease in the right mid lung likely represent pneumonia. Aspiration or contusion could also have this appearance. Electronically Signed   By: Lucienne Capers M.D.   On: 04/10/2017 06:41   Dg Chest 2 View  Result Date: 03/31/2017 CLINICAL DATA:  Cough. EXAM: CHEST  2 VIEW COMPARISON:  07/26/2016 and prior radiographs FINDINGS: Cardiomegaly again noted. Bibasilar opacities are present-question atelectasis versus pneumonia. No pleural effusion or pneumothorax. No acute bony abnormalities are noted. IMPRESSION: Bibasilar opacities-question atelectasis versus pneumonia. Cardiomegaly. Electronically Signed   By: Margarette Canada M.D.   On: 03/31/2017 19:34   Dg Lumbar Spine Complete  Result Date: 04/10/2017 CLINICAL DATA:  Patient fell out of bed.  Low back pain. EXAM: LUMBAR SPINE - COMPLETE 4+ VIEW COMPARISON:  10/25/2016 FINDINGS: Mild lumbar scoliosis convex towards the right. Degenerative changes throughout the lumbar spine  with narrowed interspaces and endplate hypertrophic changes. Interval anterior compression of L1 since previous study. Given the history of trauma, this could represent acute compression fracture. No retropulsion of fracture fragments. Degenerative disc disease in the lower lumbar spine. Mild compression of L4 is unchanged. Vascular calcifications throughout the aorta. IMPRESSION: Anterior compression of L1, new since previous study and likely acute. This represents approximately 25% loss of height. Diffuse degenerative change throughout the lumbar spine. Aortic atherosclerosis. Electronically Signed   By: Lucienne Capers M.D.   On: 04/10/2017 06:43   Dg Pelvis 1-2 Views  Result Date: 04/10/2017 CLINICAL DATA:  Patient fell out of bed.  Low back pain. EXAM: PELVIS - 1-2 VIEW COMPARISON:  CT abdomen and pelvis 07/26/2016. FINDINGS: Degenerative changes in the lower lumbar spine and hips. No evidence of acute fracture or dislocation in the pelvis or hips. SI joints and symphysis pubis are not displaced. No focal bone lesion or bone destruction. IMPRESSION: Degenerative changes in the lower lumbar spine and both hips. No acute bony abnormalities. Electronically Signed   By: Lucienne Capers  M.D.   On: 04/10/2017 06:40   Ct Head Wo Contrast  Result Date: 04/10/2017 CLINICAL DATA:  84 year old female fell out of bed. Complaining of lower mid back pain. Does not recall hitting head. Headaches and bilateral neck stiffness. Initial encounter. EXAM: CT HEAD WITHOUT CONTRAST CT CERVICAL SPINE WITHOUT CONTRAST TECHNIQUE: Multidetector CT imaging of the head and cervical spine was performed following the standard protocol without intravenous contrast. Multiplanar CT image reconstructions of the cervical spine were also generated. COMPARISON:  07/16/2016 CT head and cervical spine. FINDINGS: CT HEAD FINDINGS Brain: No intracranial hemorrhage or CT evidence of large acute infarct. Chronic microvascular changes. Focal fat  anterior falx otherwise, no intracranial mass lesion noted on this unenhanced exam. Vascular: Ectatic basilar artery.  Calcified carotid arteries. Skull: No skull fracture Sinuses/Orbits: Chronic nonspecific soft tissue swelling inferior medial aspect of the left globe. Partial opacification left frontal sinus. Other: Mastoid air cells and middle ear cavities are clear. CT CERVICAL SPINE FINDINGS Alignment: Minimal curvature convex left. Minimal anterior slip C3 and C4 and minimal retrolisthesis C5 unchanged from prior exam. Skull base and vertebrae: Evaluation slightly limited by artifact from patient's shoulders/habitus. No acute cervical spine fracture noted. Soft tissues and spinal canal: No abnormal prevertebral soft tissue swelling. Disc levels:  Evaluation limited by habitus. Upper chest: Bilateral upper lobe airspace disease greater on right. Please see chest x-ray report from same date. Other: Left thyroid 1.3 centimeter nodule and right thyroid 6 millimeter nodule incidentally noted. Carotid bifurcation calcifications. IMPRESSION: No skull fracture or intracranial hemorrhage. No cervical spine fracture or abnormal prevertebral soft tissue swelling. Alignment similar to prior exam. Evaluation slightly limited by patient's habitus/shoulder artifact. Bilateral upper lobe airspace disease greater on right. Please see chest x-ray report from same date. Electronically Signed   By: Genia Del M.D.   On: 04/10/2017 07:10   Ct Cervical Spine Wo Contrast  Result Date: 04/10/2017 CLINICAL DATA:  84 year old female fell out of bed. Complaining of lower mid back pain. Does not recall hitting head. Headaches and bilateral neck stiffness. Initial encounter. EXAM: CT HEAD WITHOUT CONTRAST CT CERVICAL SPINE WITHOUT CONTRAST TECHNIQUE: Multidetector CT imaging of the head and cervical spine was performed following the standard protocol without intravenous contrast. Multiplanar CT image reconstructions of the cervical  spine were also generated. COMPARISON:  07/16/2016 CT head and cervical spine. FINDINGS: CT HEAD FINDINGS Brain: No intracranial hemorrhage or CT evidence of large acute infarct. Chronic microvascular changes. Focal fat anterior falx otherwise, no intracranial mass lesion noted on this unenhanced exam. Vascular: Ectatic basilar artery.  Calcified carotid arteries. Skull: No skull fracture Sinuses/Orbits: Chronic nonspecific soft tissue swelling inferior medial aspect of the left globe. Partial opacification left frontal sinus. Other: Mastoid air cells and middle ear cavities are clear. CT CERVICAL SPINE FINDINGS Alignment: Minimal curvature convex left. Minimal anterior slip C3 and C4 and minimal retrolisthesis C5 unchanged from prior exam. Skull base and vertebrae: Evaluation slightly limited by artifact from patient's shoulders/habitus. No acute cervical spine fracture noted. Soft tissues and spinal canal: No abnormal prevertebral soft tissue swelling. Disc levels:  Evaluation limited by habitus. Upper chest: Bilateral upper lobe airspace disease greater on right. Please see chest x-ray report from same date. Other: Left thyroid 1.3 centimeter nodule and right thyroid 6 millimeter nodule incidentally noted. Carotid bifurcation calcifications. IMPRESSION: No skull fracture or intracranial hemorrhage. No cervical spine fracture or abnormal prevertebral soft tissue swelling. Alignment similar to prior exam. Evaluation slightly limited by patient's habitus/shoulder artifact. Bilateral  upper lobe airspace disease greater on right. Please see chest x-ray report from same date. Electronically Signed   By: Genia Del M.D.   On: 04/10/2017 07:10    Assessment/Plan  #1-recent weight gain- she continues on Lasix 20 mg a day she had a short course of 40 mg a day for 3 days- weight has come down about 8 pounds-clinically appears to be stable in this regards and improved-update BMP is pending- she does have a history of  renal insufficiency but creatinine actually showed improvement health 1.28 on lab done on February 11-an updated lab is pending for first laboratory day next week.  2.  History of right upper lobe pneumonia-clinically this appears to be stabilized she is completing a course of Augmentin.  She continues on guaifenesin she also has pro-air every 6 hours as needed.  and nebulizers routinely 4 times a day  #3-history of atrial fibrillation this appears rate controlled on diltiazem   [id number]-of note greater than 25 minutes spent assessing patient-reviewing her chart labs- discussing her status with nursing staff- and coordinating and formulating plan of care- of note greater than 50% of time spent coordinating plan of care with input as noted above

## 2017-04-30 ENCOUNTER — Encounter: Payer: Self-pay | Admitting: Internal Medicine

## 2017-05-01 ENCOUNTER — Encounter (HOSPITAL_COMMUNITY)
Admission: AD | Admit: 2017-05-01 | Discharge: 2017-05-01 | Disposition: A | Discharge status: Home / Self Care | Payer: Medicare Other | Source: Skilled Nursing Facility

## 2017-05-01 LAB — BASIC METABOLIC PANEL
Anion gap: 7 (ref 5–15)
BUN: 24 mg/dL — AB (ref 6–20)
CO2: 37 mmol/L — ABNORMAL HIGH (ref 22–32)
Calcium: 9.3 mg/dL (ref 8.9–10.3)
Chloride: 98 mmol/L — ABNORMAL LOW (ref 101–111)
Creatinine, Ser: 1.43 mg/dL — ABNORMAL HIGH (ref 0.44–1.00)
GFR calc Af Amer: 37 mL/min — ABNORMAL LOW (ref >60)
GFR, EST NON AFRICAN AMERICAN: 32 mL/min — AB (ref >60)
Glucose, Bld: 270 mg/dL — ABNORMAL HIGH (ref 65–99)
POTASSIUM: 3.3 mmol/L — AB (ref 3.5–5.1)
SODIUM: 142 mmol/L (ref 135–145)

## 2017-05-03 ENCOUNTER — Inpatient Hospital Stay (HOSPITAL_COMMUNITY)
Admission: EM | Admit: 2017-05-03 | Discharge: 2017-05-06 | DRG: 871 | Disposition: A | Discharge status: Skilled Nursing Facility | Payer: Medicare Other | Attending: Internal Medicine | Admitting: Internal Medicine

## 2017-05-03 ENCOUNTER — Encounter (HOSPITAL_COMMUNITY): Payer: Self-pay

## 2017-05-03 ENCOUNTER — Other Ambulatory Visit: Payer: Self-pay

## 2017-05-03 ENCOUNTER — Emergency Department (HOSPITAL_COMMUNITY): Payer: Medicare Other

## 2017-05-03 DIAGNOSIS — F411 Generalized anxiety disorder: Secondary | ICD-10-CM | POA: Diagnosis present

## 2017-05-03 DIAGNOSIS — E1142 Type 2 diabetes mellitus with diabetic polyneuropathy: Secondary | ICD-10-CM | POA: Diagnosis present

## 2017-05-03 DIAGNOSIS — R05 Cough: Secondary | ICD-10-CM | POA: Diagnosis not present

## 2017-05-03 DIAGNOSIS — E1165 Type 2 diabetes mellitus with hyperglycemia: Secondary | ICD-10-CM | POA: Diagnosis present

## 2017-05-03 DIAGNOSIS — G9340 Encephalopathy, unspecified: Secondary | ICD-10-CM | POA: Diagnosis not present

## 2017-05-03 DIAGNOSIS — Z66 Do not resuscitate: Secondary | ICD-10-CM | POA: Diagnosis present

## 2017-05-03 DIAGNOSIS — I129 Hypertensive chronic kidney disease with stage 1 through stage 4 chronic kidney disease, or unspecified chronic kidney disease: Secondary | ICD-10-CM | POA: Diagnosis present

## 2017-05-03 DIAGNOSIS — J9621 Acute and chronic respiratory failure with hypoxia: Secondary | ICD-10-CM | POA: Diagnosis not present

## 2017-05-03 DIAGNOSIS — I10 Essential (primary) hypertension: Secondary | ICD-10-CM | POA: Diagnosis not present

## 2017-05-03 DIAGNOSIS — F0390 Unspecified dementia without behavioral disturbance: Secondary | ICD-10-CM | POA: Diagnosis not present

## 2017-05-03 DIAGNOSIS — J181 Lobar pneumonia, unspecified organism: Secondary | ICD-10-CM | POA: Diagnosis present

## 2017-05-03 DIAGNOSIS — Z9181 History of falling: Secondary | ICD-10-CM | POA: Diagnosis not present

## 2017-05-03 DIAGNOSIS — J189 Pneumonia, unspecified organism: Secondary | ICD-10-CM | POA: Diagnosis present

## 2017-05-03 DIAGNOSIS — E118 Type 2 diabetes mellitus with unspecified complications: Secondary | ICD-10-CM | POA: Diagnosis not present

## 2017-05-03 DIAGNOSIS — N179 Acute kidney failure, unspecified: Secondary | ICD-10-CM | POA: Diagnosis not present

## 2017-05-03 DIAGNOSIS — J440 Chronic obstructive pulmonary disease with acute lower respiratory infection: Secondary | ICD-10-CM | POA: Diagnosis present

## 2017-05-03 DIAGNOSIS — M069 Rheumatoid arthritis, unspecified: Secondary | ICD-10-CM | POA: Diagnosis present

## 2017-05-03 DIAGNOSIS — E860 Dehydration: Secondary | ICD-10-CM | POA: Diagnosis present

## 2017-05-03 DIAGNOSIS — Z7901 Long term (current) use of anticoagulants: Secondary | ICD-10-CM

## 2017-05-03 DIAGNOSIS — A419 Sepsis, unspecified organism: Principal | ICD-10-CM | POA: Diagnosis present

## 2017-05-03 DIAGNOSIS — N183 Chronic kidney disease, stage 3 unspecified: Secondary | ICD-10-CM | POA: Diagnosis present

## 2017-05-03 DIAGNOSIS — Z90710 Acquired absence of both cervix and uterus: Secondary | ICD-10-CM

## 2017-05-03 DIAGNOSIS — E1122 Type 2 diabetes mellitus with diabetic chronic kidney disease: Secondary | ICD-10-CM | POA: Diagnosis present

## 2017-05-03 DIAGNOSIS — E876 Hypokalemia: Secondary | ICD-10-CM | POA: Diagnosis not present

## 2017-05-03 DIAGNOSIS — K219 Gastro-esophageal reflux disease without esophagitis: Secondary | ICD-10-CM | POA: Diagnosis present

## 2017-05-03 DIAGNOSIS — J9622 Acute and chronic respiratory failure with hypercapnia: Secondary | ICD-10-CM | POA: Diagnosis present

## 2017-05-03 DIAGNOSIS — F329 Major depressive disorder, single episode, unspecified: Secondary | ICD-10-CM | POA: Diagnosis present

## 2017-05-03 DIAGNOSIS — I482 Chronic atrial fibrillation, unspecified: Secondary | ICD-10-CM

## 2017-05-03 DIAGNOSIS — R509 Fever, unspecified: Secondary | ICD-10-CM

## 2017-05-03 DIAGNOSIS — Z9981 Dependence on supplemental oxygen: Secondary | ICD-10-CM | POA: Diagnosis not present

## 2017-05-03 DIAGNOSIS — J449 Chronic obstructive pulmonary disease, unspecified: Secondary | ICD-10-CM

## 2017-05-03 DIAGNOSIS — T380X5A Adverse effect of glucocorticoids and synthetic analogues, initial encounter: Secondary | ICD-10-CM | POA: Diagnosis present

## 2017-05-03 DIAGNOSIS — E1140 Type 2 diabetes mellitus with diabetic neuropathy, unspecified: Secondary | ICD-10-CM | POA: Diagnosis not present

## 2017-05-03 DIAGNOSIS — G4700 Insomnia, unspecified: Secondary | ICD-10-CM | POA: Diagnosis present

## 2017-05-03 DIAGNOSIS — G9341 Metabolic encephalopathy: Secondary | ICD-10-CM | POA: Diagnosis present

## 2017-05-03 DIAGNOSIS — Z8701 Personal history of pneumonia (recurrent): Secondary | ICD-10-CM | POA: Diagnosis not present

## 2017-05-03 DIAGNOSIS — K529 Noninfective gastroenteritis and colitis, unspecified: Secondary | ICD-10-CM | POA: Diagnosis present

## 2017-05-03 DIAGNOSIS — R41841 Cognitive communication deficit: Secondary | ICD-10-CM | POA: Diagnosis not present

## 2017-05-03 DIAGNOSIS — R278 Other lack of coordination: Secondary | ICD-10-CM | POA: Diagnosis not present

## 2017-05-03 DIAGNOSIS — Y95 Nosocomial condition: Secondary | ICD-10-CM | POA: Diagnosis present

## 2017-05-03 DIAGNOSIS — E7800 Pure hypercholesterolemia, unspecified: Secondary | ICD-10-CM | POA: Diagnosis present

## 2017-05-03 DIAGNOSIS — M797 Fibromyalgia: Secondary | ICD-10-CM | POA: Diagnosis present

## 2017-05-03 DIAGNOSIS — S32010D Wedge compression fracture of first lumbar vertebra, subsequent encounter for fracture with routine healing: Secondary | ICD-10-CM | POA: Diagnosis not present

## 2017-05-03 DIAGNOSIS — R7881 Bacteremia: Secondary | ICD-10-CM | POA: Diagnosis present

## 2017-05-03 DIAGNOSIS — G629 Polyneuropathy, unspecified: Secondary | ICD-10-CM

## 2017-05-03 DIAGNOSIS — Z79899 Other long term (current) drug therapy: Secondary | ICD-10-CM

## 2017-05-03 DIAGNOSIS — J9611 Chronic respiratory failure with hypoxia: Secondary | ICD-10-CM | POA: Diagnosis not present

## 2017-05-03 DIAGNOSIS — R262 Difficulty in walking, not elsewhere classified: Secondary | ICD-10-CM | POA: Diagnosis not present

## 2017-05-03 DIAGNOSIS — M6281 Muscle weakness (generalized): Secondary | ICD-10-CM | POA: Diagnosis not present

## 2017-05-03 DIAGNOSIS — M810 Age-related osteoporosis without current pathological fracture: Secondary | ICD-10-CM | POA: Diagnosis present

## 2017-05-03 HISTORY — DX: Type 2 diabetes mellitus without complications: E11.9

## 2017-05-03 HISTORY — DX: Cognitive communication deficit: R41.841

## 2017-05-03 HISTORY — DX: Muscle weakness (generalized): M62.81

## 2017-05-03 HISTORY — DX: Polyneuropathy, unspecified: G62.9

## 2017-05-03 LAB — BASIC METABOLIC PANEL
Anion gap: 14 (ref 5–15)
BUN: 22 mg/dL — AB (ref 6–20)
CHLORIDE: 93 mmol/L — AB (ref 101–111)
CO2: 29 mmol/L (ref 22–32)
CREATININE: 1.6 mg/dL — AB (ref 0.44–1.00)
Calcium: 9.3 mg/dL (ref 8.9–10.3)
GFR calc non Af Amer: 28 mL/min — ABNORMAL LOW (ref >60)
GFR, EST AFRICAN AMERICAN: 33 mL/min — AB (ref >60)
Glucose, Bld: 296 mg/dL — ABNORMAL HIGH (ref 65–99)
POTASSIUM: 2.8 mmol/L — AB (ref 3.5–5.1)
Sodium: 136 mmol/L (ref 135–145)

## 2017-05-03 LAB — INFLUENZA PANEL BY PCR (TYPE A & B)
INFLAPCR: NEGATIVE
Influenza B By PCR: NEGATIVE

## 2017-05-03 LAB — GLUCOSE, CAPILLARY: GLUCOSE-CAPILLARY: 146 mg/dL — AB (ref 65–99)

## 2017-05-03 LAB — PROCALCITONIN: Procalcitonin: 0.39 ng/mL

## 2017-05-03 LAB — URINALYSIS, ROUTINE W REFLEX MICROSCOPIC
Bilirubin Urine: NEGATIVE
GLUCOSE, UA: NEGATIVE mg/dL
Hgb urine dipstick: NEGATIVE
KETONES UR: NEGATIVE mg/dL
LEUKOCYTES UA: NEGATIVE
NITRITE: NEGATIVE
PH: 6 (ref 5.0–8.0)
Protein, ur: NEGATIVE mg/dL
SPECIFIC GRAVITY, URINE: 1.005 (ref 1.005–1.030)

## 2017-05-03 LAB — CBC WITH DIFFERENTIAL/PLATELET
Basophils Absolute: 0 10*3/uL (ref 0.0–0.1)
Basophils Relative: 0 %
Eosinophils Absolute: 0.1 10*3/uL (ref 0.0–0.7)
Eosinophils Relative: 1 %
HEMATOCRIT: 42.1 % (ref 36.0–46.0)
Hemoglobin: 12.5 g/dL (ref 12.0–15.0)
LYMPHS ABS: 2 10*3/uL (ref 0.7–4.0)
Lymphocytes Relative: 19 %
MCH: 26.7 pg (ref 26.0–34.0)
MCHC: 29.7 g/dL — AB (ref 30.0–36.0)
MCV: 89.8 fL (ref 78.0–100.0)
MONOS PCT: 11 %
Monocytes Absolute: 1.2 10*3/uL — ABNORMAL HIGH (ref 0.1–1.0)
NEUTROS ABS: 7.5 10*3/uL (ref 1.7–7.7)
Neutrophils Relative %: 69 %
Platelets: 169 10*3/uL (ref 150–400)
RBC: 4.69 MIL/uL (ref 3.87–5.11)
RDW: 18.8 % — ABNORMAL HIGH (ref 11.5–15.5)
WBC: 10.9 10*3/uL — ABNORMAL HIGH (ref 4.0–10.5)

## 2017-05-03 LAB — I-STAT CG4 LACTIC ACID, ED
LACTIC ACID, VENOUS: 2.65 mmol/L — AB (ref 0.5–1.9)
Lactic Acid, Venous: 4.25 mmol/L (ref 0.5–1.9)

## 2017-05-03 LAB — TROPONIN I: Troponin I: <0.03 ng/mL (ref <0.03)

## 2017-05-03 LAB — PROTIME-INR
INR: 1.26
PROTHROMBIN TIME: 15.7 s — AB (ref 11.4–15.2)

## 2017-05-03 LAB — CBG MONITORING, ED: Glucose-Capillary: 277 mg/dL — ABNORMAL HIGH (ref 65–99)

## 2017-05-03 LAB — MAGNESIUM: Magnesium: 1.7 mg/dL (ref 1.7–2.4)

## 2017-05-03 LAB — LACTIC ACID, PLASMA: LACTIC ACID, VENOUS: 1.1 mmol/L (ref 0.5–1.9)

## 2017-05-03 LAB — APTT: aPTT: 32 seconds (ref 24–36)

## 2017-05-03 MED ORDER — ACETAMINOPHEN 500 MG PO TABS
1000.0000 mg | ORAL_TABLET | Freq: Once | ORAL | Status: AC
  Administered 2017-05-03: 1000 mg via ORAL
  Filled 2017-05-03: qty 2

## 2017-05-03 MED ORDER — POTASSIUM CHLORIDE CRYS ER 20 MEQ PO TBCR
40.0000 meq | EXTENDED_RELEASE_TABLET | Freq: Two times a day (BID) | ORAL | Status: DC
  Administered 2017-05-03 – 2017-05-05 (×4): 40 meq via ORAL
  Filled 2017-05-03 (×4): qty 2

## 2017-05-03 MED ORDER — ONDANSETRON HCL 4 MG/2ML IJ SOLN: 4.0000 mg | Freq: Four times a day (QID) | INTRAMUSCULAR | Status: DC | PRN

## 2017-05-03 MED ORDER — INSULIN ASPART 100 UNIT/ML ~~LOC~~ SOLN
0.0000 [IU] | Freq: Three times a day (TID) | SUBCUTANEOUS | Status: DC
  Administered 2017-05-04: 8 [IU] via SUBCUTANEOUS
  Administered 2017-05-04: 15 [IU] via SUBCUTANEOUS
  Administered 2017-05-04: 8 [IU] via SUBCUTANEOUS
  Administered 2017-05-05: 11 [IU] via SUBCUTANEOUS
  Administered 2017-05-05: 15 [IU] via SUBCUTANEOUS
  Administered 2017-05-05 – 2017-05-06 (×2): 5 [IU] via SUBCUTANEOUS
  Administered 2017-05-06: 3 [IU] via SUBCUTANEOUS

## 2017-05-03 MED ORDER — METHYLPREDNISOLONE SODIUM SUCC 125 MG IJ SOLR
60.0000 mg | Freq: Two times a day (BID) | INTRAMUSCULAR | Status: DC
  Administered 2017-05-03 – 2017-05-04 (×3): 60 mg via INTRAVENOUS
  Filled 2017-05-03 (×3): qty 2

## 2017-05-03 MED ORDER — POTASSIUM CHLORIDE CRYS ER 20 MEQ PO TBCR
40.0000 meq | EXTENDED_RELEASE_TABLET | Freq: Once | ORAL | Status: DC
  Filled 2017-05-03: qty 2

## 2017-05-03 MED ORDER — ACETAMINOPHEN 325 MG PO TABS: 650.0000 mg | ORAL_TABLET | Freq: Four times a day (QID) | ORAL | Status: DC | PRN

## 2017-05-03 MED ORDER — GUAIFENESIN ER 600 MG PO TB12
600.0000 mg | ORAL_TABLET | Freq: Two times a day (BID) | ORAL | Status: DC
  Administered 2017-05-03 – 2017-05-06 (×6): 600 mg via ORAL
  Filled 2017-05-03 (×6): qty 1

## 2017-05-03 MED ORDER — DULOXETINE HCL 60 MG PO CPEP
60.0000 mg | ORAL_CAPSULE | Freq: Every day | ORAL | Status: DC
  Administered 2017-05-03 – 2017-05-06 (×4): 60 mg via ORAL
  Filled 2017-05-03 (×4): qty 1

## 2017-05-03 MED ORDER — IPRATROPIUM-ALBUTEROL 0.5-2.5 (3) MG/3ML IN SOLN
3.0000 mL | Freq: Four times a day (QID) | RESPIRATORY_TRACT | Status: DC
  Administered 2017-05-03: 3 mL via RESPIRATORY_TRACT
  Filled 2017-05-03: qty 3

## 2017-05-03 MED ORDER — INSULIN ASPART 100 UNIT/ML ~~LOC~~ SOLN
0.0000 [IU] | Freq: Every day | SUBCUTANEOUS | Status: DC
  Administered 2017-05-04 – 2017-05-05 (×2): 4 [IU] via SUBCUTANEOUS

## 2017-05-03 MED ORDER — IPRATROPIUM-ALBUTEROL 0.5-2.5 (3) MG/3ML IN SOLN
3.0000 mL | Freq: Once | RESPIRATORY_TRACT | Status: AC
  Administered 2017-05-03: 3 mL via RESPIRATORY_TRACT
  Filled 2017-05-03: qty 3

## 2017-05-03 MED ORDER — APIXABAN 2.5 MG PO TABS
2.5000 mg | ORAL_TABLET | Freq: Two times a day (BID) | ORAL | Status: DC
  Administered 2017-05-03 – 2017-05-06 (×6): 2.5 mg via ORAL
  Filled 2017-05-03 (×6): qty 1

## 2017-05-03 MED ORDER — IPRATROPIUM-ALBUTEROL 0.5-2.5 (3) MG/3ML IN SOLN
3.0000 mL | Freq: Four times a day (QID) | RESPIRATORY_TRACT | Status: DC
  Administered 2017-05-04 – 2017-05-06 (×11): 3 mL via RESPIRATORY_TRACT
  Filled 2017-05-03 (×11): qty 3

## 2017-05-03 MED ORDER — VANCOMYCIN HCL 10 G IV SOLR
1500.0000 mg | Freq: Once | INTRAVENOUS | Status: AC
  Administered 2017-05-03: 1500 mg via INTRAVENOUS
  Filled 2017-05-03: qty 1500

## 2017-05-03 MED ORDER — LORAZEPAM 0.5 MG PO TABS
0.2500 mg | ORAL_TABLET | Freq: Two times a day (BID) | ORAL | Status: DC
  Administered 2017-05-03 – 2017-05-06 (×6): 0.25 mg via ORAL
  Filled 2017-05-03 (×6): qty 1

## 2017-05-03 MED ORDER — PANTOPRAZOLE SODIUM 40 MG PO TBEC
40.0000 mg | DELAYED_RELEASE_TABLET | Freq: Every day | ORAL | Status: DC
  Administered 2017-05-03 – 2017-05-06 (×4): 40 mg via ORAL
  Filled 2017-05-03 (×4): qty 1

## 2017-05-03 MED ORDER — POTASSIUM CHLORIDE 10 MEQ/100ML IV SOLN
10.0000 meq | INTRAVENOUS | Status: AC
  Administered 2017-05-03 (×4): 10 meq via INTRAVENOUS
  Filled 2017-05-03 (×4): qty 100

## 2017-05-03 MED ORDER — SODIUM CHLORIDE 0.9 % IV BOLUS (SEPSIS)
1000.0000 mL | Freq: Once | INTRAVENOUS | Status: AC
  Administered 2017-05-03: 1000 mL via INTRAVENOUS

## 2017-05-03 MED ORDER — MAGNESIUM SULFATE 2 GM/50ML IV SOLN
2.0000 g | Freq: Once | INTRAVENOUS | Status: AC
  Administered 2017-05-04: 2 g via INTRAVENOUS
  Filled 2017-05-03: qty 50

## 2017-05-03 MED ORDER — CEFEPIME HCL 1 G IJ SOLR
1.0000 g | Freq: Once | INTRAMUSCULAR | Status: AC
  Administered 2017-05-03: 1 g via INTRAVENOUS
  Filled 2017-05-03: qty 1

## 2017-05-03 MED ORDER — POTASSIUM CHLORIDE 20 MEQ/15ML (10%) PO SOLN
ORAL | Status: AC
  Administered 2017-05-03: 40 meq
  Filled 2017-05-03: qty 30

## 2017-05-03 MED ORDER — VANCOMYCIN HCL IN DEXTROSE 1-5 GM/200ML-% IV SOLN
1000.0000 mg | INTRAVENOUS | Status: DC
  Administered 2017-05-04 – 2017-05-06 (×3): 1000 mg via INTRAVENOUS
  Filled 2017-05-03 (×3): qty 200

## 2017-05-03 MED ORDER — SODIUM CHLORIDE 0.9 % IV BOLUS (SEPSIS)
1000.0000 mL | INTRAVENOUS | Status: AC
  Administered 2017-05-03 (×2): 1000 mL via INTRAVENOUS

## 2017-05-03 MED ORDER — SODIUM CHLORIDE 0.9 % IV SOLN
INTRAVENOUS | Status: DC
  Administered 2017-05-03 – 2017-05-04 (×3): via INTRAVENOUS

## 2017-05-03 MED ORDER — PREGABALIN 50 MG PO CAPS
100.0000 mg | ORAL_CAPSULE | Freq: Three times a day (TID) | ORAL | Status: DC
  Administered 2017-05-03 – 2017-05-06 (×8): 100 mg via ORAL
  Filled 2017-05-03 (×8): qty 2

## 2017-05-03 MED ORDER — SODIUM CHLORIDE 0.9 % IV SOLN
1.0000 g | INTRAVENOUS | Status: DC
  Filled 2017-05-03 (×2): qty 1

## 2017-05-03 NOTE — ED Notes (Signed)
CRITICAL VALUE ALERT  Critical Value:  Lactic acid 4.25  Date & Time Notied:  05/03/2017, 1601  Provider Notified: Dr. Gilford Raid by Noralyn Pick NT  Orders Received/Actions taken: see chart

## 2017-05-03 NOTE — Progress Notes (Signed)
Pharmacy Antibiotic Note  PRISILA DLOUHY is a 84 y.o. female admitted on 05/03/2017 with pneumonia.  Pharmacy has been consulted for vancomycin dosing. Cefepime 1 gm ordered in the ED  Plan: Vancomycin 1500 mg IV X 1 then 1gm IV q24 hours F/u renal function, cultures and clinical course  Weight: 204 lb (92.5 kg)  Temp (24hrs), Avg:99.4 F (37.4 C), Min:98.4 F (36.9 C), Max:100.4 F (38 C)  Recent Labs  Lab 05/01/17 0015 05/03/17 0914 05/03/17 0926  WBC  --  10.9*  --   CREATININE 1.43* 1.60*  --   LATICACIDVEN  --   --  4.25*    Estimated Creatinine Clearance: 26.7 mL/min (A) (by C-G formula based on SCr of 1.6 mg/dL (H)).    Allergies  Allergen Reactions  . Other     Band Aids   . Tape   . Lipitor [Atorvastatin Calcium] Rash and Other (See Comments)    Muscle pain  . Niaspan [Niacin Er] Rash and Other (See Comments)    Muscle pain    Antimicrobials this admission: vanc 2/20 >>  cefepime 2/20 >>   Microbiology results: 2/20 BCx:    Thank you for allowing pharmacy to be a part of this patient's care.  Excell Seltzer Poteet 05/03/2017 10:01 AM

## 2017-05-03 NOTE — ED Notes (Signed)
1000 cc fluid added at 250 cc per hr

## 2017-05-03 NOTE — H&P (Signed)
History and Physical    [name] YJE:[id number] DOB: Jul 02, 1928 DOA: 05/03/2017  PCP: Celene Squibb, MD  Patient coming from: [facility]  I have personally briefly reviewed patient's old medical records in [place]  Chief Complaint: Shortness of breath  HPI: [name] is a 84 y.o. female with medical history significant of rheumatoid arthritis, chronic respiratory failure, COPD, chronic kidney disease stage III who was recently discharged from the hospital after being treated for healthcare associated pneumonia and sent to nursing home.  Patient had apparently initially been doing well.  She developed a significant leukocytosis and chest x-ray was repeated.  There was concern for persistent pneumonia and she was started on a course of Augmentin.  Her course at the nursing home waxed and waned.  More recently, she became increasingly short of breath and was noted to be hypoxic.  She became somnolent and lethargic.  She also had vomiting and diarrhea which occurred in the last 24 hours.  On arrival to the emergency room she was noted to have a mild fever at 100.7.  Chest x-ray indicated persistent pneumonia.  Urinalysis did not show any signs of infection.  Influenza test was negative.  Creatinine is noted to be rising at 1.6.  Heart rate was stable atrial fibrillation, but blood pressure was on the lower side.  Lactic acid elevated at 4.25.  She received IV fluid in the emergency room and this is since trended down to 2.65.  She is been referred for admission.  Review of Systems: As per HPI otherwise 10 point review of systems negative.    Past Medical History:  Diagnosis Date  . Anemia   . Atrial fibrillation ([place])   . Back pain   . Bacterial pneumonia   . Cancer (HCC)    Skin   . Chronic respiratory failure ([place])   . Cognitive communication deficit   . COPD (chronic obstructive pulmonary disease) ([place])   . Dementia   . Dementia   . Depression   .  Diabetes mellitus without complication ([place])   . Diverticulosis   . Diverticulosis   . Fatty liver   . Fibromyalgia   . Fibromyalgia   . Gastric polyp 09/08/11   at the anastomosis-inflammatory  . Generalized anxiety disorder   . Generalized anxiety disorder   . GERD (gastroesophageal reflux disease)   . GERD (gastroesophageal reflux disease)   . 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 Hypercholesteremia   . Hypertension   . Insomnia   . Internal hemorrhoids   . Muscle weakness   . Neuropathy   . On home O2    qhs  . Osteoporosis   . Rheumatoid arteritis   . Ulcer    stomach  . Vitamin D deficiency     Past Surgical History:  Procedure Laterality Date  . ABDOMINAL HYSTERECTOMY    . ABDOMINAL SURGERY     removed patial stomach from ulcer  . COLONOSCOPY  04-2001   internal hemorrhoids, panocolonic diverticulosis, and colon polyps   . UPPER GASTROINTESTINAL ENDOSCOPY       reports that  has never smoked. she has never used smokeless tobacco. She reports that she does not drink alcohol or use drugs.  Allergies  Allergen Reactions  . Other     Band Aids   . Tape   . Lipitor [Atorvastatin Calcium] Rash and Other (See Comments)    Muscle pain  . Niaspan [Niacin Er] Rash and Other (See Comments)  Muscle pain    Family History  Problem Relation Age of Onset  . Heart disease Father   . Kidney disease Mother        kidney cancer  . Colon cancer Daughter 78     Prior to Admission medications   Medication Sig Start Date End Date Taking? Authorizing Provider  acetaminophen (TYLENOL) 325 MG tablet Take 650 mg by mouth every 8 (eight) hours as needed.   Yes [provider]  albuterol (PROAIR HFA) 108 (90 BASE) MCG/ACT inhaler Inhale 2 puffs into the lungs every 6 (six) hours as needed for wheezing or shortness of breath. For rescue    Yes [provider]  apixaban (ELIQUIS) 2.5 MG TABS tablet Take 2.5 mg by mouth 2 (two) times daily.   Yes  [provider]  Janne Lab Oil (VENELEX) OINT Apply to sacrum and bilateral buttocks q shift and prn for prevention   Yes [provider]  CARTIA XT 300 MG 24 hr capsule TAKE ONE CAPSULE BY MOUTH EVERY DAY 09/25/14  Yes Branch, Alphonse Guild, MD  donepezil (ARICEPT) 5 MG tablet Take 5 mg by mouth at bedtime. 05/06/16  Yes [provider]  DULoxetine (CYMBALTA) 60 MG capsule Take 60 mg by mouth daily.     Yes [provider]  furosemide (LASIX) 20 MG tablet Take 20 mg by mouth daily.   Yes [provider]  guaifenesin (ROBITUSSIN) 100 MG/5ML syrup Take 200 mg by mouth 4 (four) times daily as needed for cough.   Yes [provider]  HYDROcodone-acetaminophen (NORCO/VICODIN) 5-325 MG tablet Take 1 tablet by mouth every 8 (eight) hours as needed for moderate pain. Starting 04/24/2017   Yes [provider]  ipratropium-albuterol (DUONEB) 0.5-2.5 (3) MG/3ML SOLN Take 3 mLs by nebulization every 6 (six) hours. 04/02/17  Yes Tat, Shanon Brow, MD  linagliptin (TRADJENTA) 5 MG TABS tablet Take 1 tablet (5 mg total) by mouth daily. 04/12/17  Yes Johnson, Clanford L, MD  LORazepam (ATIVAN) 0.5 MG tablet Take 0.5 tablets (0.25 mg total) by mouth 2 (two) times daily. Take for anxiety 14 days and to be reassessed  By MD. 04/26/17  Yes Oscar La, Arlo C, PA-C  LYRICA 100 MG capsule Take 100 mg by mouth 3 (three) times daily. 12/07/15  Yes [provider]  methotrexate 2.5 MG tablet Take 12.5 mg by mouth. On Thursday at noon.   Yes [provider]  Multiple Vitamin (DAILY VITE PO) Take 1 tablet by mouth daily.   Yes [provider]  pantoprazole (PROTONIX) 40 MG tablet Take 1 tablet (40 mg total) by mouth daily. 04/12/17  Yes Johnson, Clanford L, MD  phenylephrine-shark liver oil-mineral oil-petrolatum (PREPARATION H) 0.25-3-14-71.9 % rectal ointment Place 1 application rectally 2 (two) times daily as needed for hemorrhoids.   Yes [provider]  polyethylene glycol (MIRALAX / GLYCOLAX) packet Take 17 g by mouth daily as needed for moderate constipation. 04/12/17  Yes Johnson, Clanford L, MD  potassium chloride SA (K-DUR,KLOR-CON) 20 MEQ tablet Take 40 mEq by mouth 2 (two) times daily.   Yes [provider]  raloxifene (EVISTA) 60 MG tablet Take 60 mg by mouth daily.   Yes [provider]  rosuvastatin (CRESTOR) 20 MG tablet Take 20 mg by mouth daily.   Yes [provider]    Physical Exam: Vitals:   05/03/17 1027 05/03/17 1034 05/03/17 1042 05/03/17 1456  BP: 131/62  131/62 (!) 118/54  Pulse: 81  83  72  Resp: 18  19 20   Temp:      TempSrc:      SpO2: 94% 95% 93% 96%  Weight:        Constitutional: NAD, calm, comfortable Vitals:   05/03/17 1027 05/03/17 1034 05/03/17 1042 05/03/17 1456  BP: 131/62  131/62 (!) 118/54  Pulse: 81  83 72  Resp: 18  19 20   Temp:      TempSrc:      SpO2: 94% 95% 93% 96%  Weight:       Eyes: PERRL, lids and conjunctivae normal ENMT: Mucous membranes are dry. Posterior pharynx clear of any exudate or lesions.Normal dentition.  Neck: normal, supple, no masses, no thyromegaly Respiratory: Bilateral rhonchi with mild wheeze. Normal respiratory effort. No accessory muscle use.  Cardiovascular: Irregular, no murmurs / rubs / gallops. No extremity edema. 2+ pedal pulses. No carotid bruits.  Abdomen: no tenderness, no masses palpated. No hepatosplenomegaly. Bowel sounds positive.  Musculoskeletal: no clubbing / cyanosis. No joint deformity upper and lower extremities. Good ROM, no contractures. Normal muscle tone.  Skin: no rashes, lesions, ulcers. No induration Neurologic: Limited exam due to mental status.  No obvious gross neurologic deficits Psychiatric: Lethargic  Labs on Admission: I have personally reviewed following labs and imaging studies  CBC: Recent Labs  Lab 05/03/17 0914  WBC 10.9*  NEUTROABS 7.5  HGB 12.5  HCT 42.1  MCV 89.8  PLT  595   Basic Metabolic Panel: Recent Labs  Lab 05/01/17 0015 05/03/17 0914  NA 142 136  K 3.3* 2.8*  CL 98* 93*  CO2 37* 29  GLUCOSE 270* 296*  BUN 24* 22*  CREATININE 1.43* 1.60*  CALCIUM 9.3 9.3  MG  --  1.7   GFR: Estimated Creatinine Clearance: 26.7 mL/min (A) (by C-G formula based on SCr of 1.6 mg/dL (H)). Liver Function Tests: No results for input(s): AST, ALT, ALKPHOS, BILITOT, PROT, ALBUMIN in the last 168 hours. No results for input(s): LIPASE, AMYLASE in the last 168 hours. No results for input(s): AMMONIA in the last 168 hours. Coagulation Profile: No results for input(s): INR, PROTIME in the last 168 hours. Cardiac Enzymes: Recent Labs  Lab 05/03/17 0914  TROPONINI <0.03   BNP (last 3 results) No results for input(s): PROBNP in the last 8760 hours. HbA1C: No results for input(s): HGBA1C in the last 72 hours. CBG: Recent Labs  Lab 05/03/17 0920  GLUCAP 277*   Lipid Profile: No results for input(s): CHOL, HDL, LDLCALC, TRIG, CHOLHDL, LDLDIRECT in the last 72 hours. Thyroid Function Tests: No results for input(s): TSH, T4TOTAL, FREET4, T3FREE, THYROIDAB in the last 72 hours. Anemia Panel: No results for input(s): VITAMINB12, FOLATE, FERRITIN, TIBC, IRON, RETICCTPCT in the last 72 hours. Urine analysis:    Component Value Date/Time   COLORURINE YELLOW 05/03/2017 0914   APPEARANCEUR CLEAR 05/03/2017 0914   LABSPEC 1.005 05/03/2017 0914   PHURINE 6.0 05/03/2017 0914   GLUCOSEU NEGATIVE 05/03/2017 0914   HGBUR NEGATIVE 05/03/2017 0914   BILIRUBINUR NEGATIVE 05/03/2017 0914   KETONESUR NEGATIVE 05/03/2017 0914   PROTEINUR NEGATIVE 05/03/2017 0914   UROBILINOGEN 1.0 10/11/2014 1428   NITRITE NEGATIVE 05/03/2017 0914   LEUKOCYTESUR NEGATIVE 05/03/2017 0914    Radiological Exams on Admission: Dg Chest Portable 1 View  Result Date: 05/03/2017 CLINICAL DATA:  Cough EXAM: PORTABLE CHEST 1 VIEW COMPARISON:  04/10/2017 FINDINGS: There is persistent right  upper lobe airspace disease most concerning for pneumonia. There is diffuse bilateral interstitial thickening. There  is no significant pleural effusion. There is no pneumothorax. There is stable cardiomegaly. The osseous structures are unremarkable. IMPRESSION: 1. Persistent right upper lobe pneumonia. 2. Bilateral mild interstitial thickening primarily at the lung bases which may reflect multi lobar pneumonia versus mild CHF. Electronically Signed   By: Kathreen Devoid   On: 05/03/2017 09:16    EKG: Independently reviewed.  Atrial fibrillation  Assessment/Plan Active Problems:   Fibromyalgia   COPD (chronic obstructive pulmonary disease) (HCC)   GERD (gastroesophageal reflux disease)   Hypokalemia   Acute encephalopathy   AKI (acute kidney injury) ([place])   Acute gastroenteritis   Peripheral neuropathy   Dementia   Acute on chronic respiratory failure with hypoxia (HCC)   CKD (chronic kidney disease), stage III (HCC)   HCAP (healthcare-associated pneumonia)   Diabetes type 2, controlled ([place])   Atrial fibrillation, chronic ([place])   Rheumatoid arthritis ([place])   Sepsis ([place])    1. Acute on chronic respiratory failure.  Related to pneumonia and COPD.  She is chronically on 2-3 L.  Noted to be hypoxic with oxygen saturations in the 80s.  Initially started on a nonrebreather, but currently on 7 L high flow nasal cannula.  She is been started on antibiotics for pneumonia.  Will wean down oxygen as tolerated. 2. Sepsis.  Related to pneumonia.  Blood pressures running low.  Lactic acid has trended down since admission with IV fluids.  Will continue on IV hydration and antibiotics.  Blood cultures have been sent. 3. Healthcare associated pneumonia.  Will start on vancomycin and cefepime.  Requested swallow evaluation to rule out underlying dysphagia/aspiration since she appears to have recurrent pneumonia.  Check sputum culture.  Check urinary antigens. 4. COPD.  Does have some mild wheezing.   Continue on bronchodilators.  Will start intravenous steroids. 5. Acute kidney injury chronic kidney disease stage III.  Related to dehydration.  Start on IV fluids.  Recheck labs in a.m.  Urinalysis is unrevealing. 6. Vomiting, diarrhea.  Possibly related to viral illness.  Since she is been on antibiotics, C. difficile has been ordered in the emergency room.  Continue on clear liquids for now. 7. Chronic atrial fibrillation.  On anticoagulation with Eliquis.  Will hold diltiazem for now since blood pressures running low.  Continue to monitor. 8. Diabetes.  Hold oral agents.  Start on sliding scale insulin. 9. Rheumatoid arthritis/fibromyalgia.  Patient is currently on methotrexate.  Will hold while in the hospital for now. 10. Peripheral neuropathy.  Suspect related to diabetes.  Continue on Lyrica. 11. Dementia.  Anticipate the patient will be more confused in the hospital.  Currently she is lethargic.  Likely related to infection and dehydration.  Continue on IV fluids. 12. Hypokalemia.  Related to vomiting as well as concurrent Lasix use.  Will replace.  Will also replace magnesium. 13. Goals of care.  Discussed patient's multiple comorbidities and overall prognosis with daughter who is POA.  Her daughter agrees with the current plan of care and does not wish to escalate care any further if the patient would decompensate.  She would not want the patient to be put on BiPAP will be transferred to stepdown unit.  If her condition does decline, would transition care towards comfort.   DVT prophylaxis: Eliquis Code Status: DNR.  If patient continues to decline, family does not wish to escalate care Family Communication: Discussed with daughter, [name], at the bedside who is her POA Disposition Plan: Pending hospital course, return to skilled nursing facility  if she improves Consults called:  Admission status: Inpatient, MedSurg   Kathie Dike MD [facility] Pager [phone]  If  7PM-7AM, please contact night-coverage [email or web address] Password [email or web address]  05/03/2017, 4:47 PM

## 2017-05-03 NOTE — Progress Notes (Signed)
PHARMACY NOTE:  ANTIMICROBIAL RENAL DOSAGE ADJUSTMENT  Current antimicrobial regimen includes a mismatch between antimicrobial dosage and estimated renal function.  As per policy approved by the Pharmacy & Therapeutics and Medical Executive Committees, the antimicrobial dosage will be adjusted accordingly.  Current antimicrobial dosage:  Cefepime 1gm IV q8hrs  Indication: pneumonia  Renal Function:  Estimated Creatinine Clearance: 26.7 mL/min (A) (by C-G formula based on SCr of 1.6 mg/dL (H)). []      On intermittent HD, scheduled: []      On CRRT    Antimicrobial dosage has been changed to:  Cefepime 1gm IV q24hrs   Additional comments:   Thank you for allowing pharmacy to be a part of this patient's care.  Jerrye Beavers, PharmD, BCPS Clinical Pharmacist

## 2017-05-03 NOTE — ED Triage Notes (Signed)
Pt resident of [facility]  Nurse reports pt had 1 episode of v/d yesterday.  Today c/o weakness, wheezing, cough, confusion, o2 sat below 85% on 3liters, and tachycardic.  Reports is on 3liters continuous.  Reports pt diaphoretic, clammy.   Oral temp 99.4 per facility.

## 2017-05-03 NOTE — ED Provider Notes (Signed)
[facility] EMERGENCY DEPARTMENT Provider Note   CSN: [id number] Arrival date & time: 05/03/17  8338     History   Chief Complaint Chief Complaint  Patient presents with  . Emesis  . Weakness    HPI [name] is a 84 y.o. female.  Pt presents to the ED today with confusion, sob, n/v/d.  The pt was admitted from 1/23-1/31 for HCAP.  She was placed on cefepime and vancomycin and was d/c home on doxycycline.  Her SNF put her on Augmentin last week for continued pna on CXR.  The pt has had some episodes of n/v.  Today, on her normal home O2 sats were as low as 80%.  Pt does have a hx of afib and is anticoagulated on Eliquis.  CHA2DS2/VAS Stroke Risk Points      5 >= 2 Points: High Risk  1 - 1.99 Points: Medium Risk  0 Points: Low Risk    The previous score was 4 on 07/27/2016.:  Change:     Details    This score determines the patient's risk of having a stroke if the  patient has atrial fibrillation.       Points Metrics  0 Has Congestive Heart Failure:  No   0 Has Vascular Disease:  No   1 Has Hypertension:  Yes   2 Age:  37   1 Has Diabetes:  Yes   0 Had Stroke:  No  Had TIA:  No  Had thromboembolism:  No   1 Female:  Yes                Past Medical History:  Diagnosis Date  . Anemia   . Atrial fibrillation ([place])   . Back pain   . Bacterial pneumonia   . Cancer (HCC)    Skin   . Chronic respiratory failure ([place])   . Cognitive communication deficit   . COPD (chronic obstructive pulmonary disease) ([place])   . Dementia   . Dementia   . Depression   . Diabetes mellitus without complication ([place])   . Diverticulosis   . Diverticulosis   . Fatty liver   . Fibromyalgia   . Fibromyalgia   . Gastric polyp 09/08/11   at the anastomosis-inflammatory  . Generalized anxiety disorder   . Generalized anxiety disorder   . GERD (gastroesophageal reflux disease)   . GERD (gastroesophageal reflux disease)   . 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  Hypercholesteremia   . Hypertension   . Insomnia   . Internal hemorrhoids   . Muscle weakness   . Neuropathy   . On home O2    qhs  . Osteoporosis   . Rheumatoid arteritis   . Ulcer    stomach  . Vitamin D deficiency     Patient Active Problem List   Diagnosis Date Noted  . Diabetes type 2, controlled ([place]) 04/20/2017  . HCAP (healthcare-associated pneumonia) 04/10/2017  . Closed compression fracture of L1 lumbar vertebra ([place]) 04/10/2017  . Acute on chronic respiratory failure with hypoxia ([place]) 04/01/2017  . CKD (chronic kidney disease), stage III ([place]) 04/01/2017  . COPD exacerbation ([place]) 03/31/2017  . Depression 03/31/2017  . Dementia 03/31/2017  . Peripheral neuropathy 08/01/2016  . AKI (acute kidney injury) ([place]) 07/26/2016  . Hypertension 07/26/2016  . Acute gastroenteritis 07/26/2016  . Altered mental status 07/26/2016  . Prolonged QT interval 07/26/2016  . Syncope 09/12/2014  . Hyperglycemia 09/12/2014  . Hypoxia 09/12/2014  . Acute encephalopathy  09/12/2014  . Chest pain 07/21/2013  . Chronic respiratory failure ([place]) 07/21/2013  . Hypokalemia 07/21/2013  . Atrial fibrillation with RVR ([place]) 07/21/2013  . Knee pain 05/21/2013  . Fibromyalgia   . COPD (chronic obstructive pulmonary disease) ([place])   . 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 GERD (gastroesophageal reflux disease)   . Rheumatoid arteritis   . Back pain   . Fracture of distal fibula 09/20/2010    Past Surgical History:  Procedure Laterality Date  . ABDOMINAL HYSTERECTOMY    . ABDOMINAL SURGERY     removed patial stomach from ulcer  . COLONOSCOPY  04-2001   internal hemorrhoids, panocolonic diverticulosis, and colon polyps   . UPPER GASTROINTESTINAL ENDOSCOPY      OB History    Gravida Para Term Preterm AB Living   6 6 6     4    SAB TAB Ectopic Multiple Live Births                   Home Medications    Prior to Admission medications   Medication Sig Start Date End Date  Taking? Authorizing Provider  acetaminophen (TYLENOL) 325 MG tablet Take 650 mg by mouth every 8 (eight) hours as needed.    [provider]  albuterol (PROAIR HFA) 108 (90 BASE) MCG/ACT inhaler Inhale 2 puffs into the lungs every 6 (six) hours as needed for wheezing or shortness of breath. For rescue     [provider]  amoxicillin-clavulanate (AUGMENTIN) 500-125 MG tablet Take 1 tablet by mouth 3 (three) times daily.    [provider]  apixaban (ELIQUIS) 2.5 MG TABS tablet Take 2.5 mg by mouth 2 (two) times daily.    [provider]  Janne Lab Oil [facility] [place] Apply to sacrum and bilateral buttocks q shift and prn for prevention    [provider]  CARTIA XT 300 MG 24 hr capsule TAKE ONE CAPSULE BY MOUTH EVERY DAY 09/25/14   Arnoldo Lenis, MD  donepezil (ARICEPT) 5 MG tablet Take 5 mg by mouth at bedtime. 05/06/16   [provider]  DULoxetine (CYMBALTA) 60 MG capsule Take 60 mg by mouth daily.      [provider]  furosemide (LASIX) 20 MG tablet Take 20 mg by mouth daily.    [provider]  guaifenesin (ROBITUSSIN) 100 MG/5ML syrup Take 200 mg by mouth 4 (four) times daily as needed for cough.    [provider]  HYDROcodone-acetaminophen (NORCO/VICODIN) 5-325 MG tablet Take 1 tablet by mouth every 8 (eight) hours as needed for moderate pain. Starting 04/24/2017    [provider]  HYDROcodone-acetaminophen (NORCO/VICODIN) 5-325 MG tablet Take 1 tablet by mouth every 8 (eight) hours as needed for moderate pain. Starting 04/24/2017    [provider]  ipratropium-albuterol (DUONEB) 0.5-2.5 (3) MG/3ML SOLN Take 3 mLs by nebulization every 6 (six) hours. 04/02/17   Orson Eva, MD  linagliptin (TRADJENTA) 5 MG TABS tablet Take 1 tablet (5 mg total) by mouth daily. 04/12/17   Johnson, Clanford L, MD  LORazepam (ATIVAN) 0.5 MG tablet Take 0.5 tablets (0.25 mg total) by mouth 2 (two) times  daily. Take for anxiety 14 days and to be reassessed  By MD. 04/26/17   Granville Lewis C, PA-C  LYRICA 100 MG capsule Take 100 mg by mouth 3 (three) times daily. 12/07/15   [provider]  methotrexate 2.5 MG tablet Take 12.5 mg by mouth. On Thursday at noon.  [provider]  Multiple Vitamin (DAILY VITE PO) Take 1 tablet by mouth daily.    [provider]  pantoprazole (PROTONIX) 40 MG tablet Take 1 tablet (40 mg total) by mouth daily. 04/12/17   Johnson, Clanford L, MD  phenylephrine-shark liver oil-mineral oil-petrolatum (PREPARATION H) 0.25-3-14-71.9 % rectal ointment Place 1 application rectally 2 (two) times daily as needed for hemorrhoids.    [provider]  polyethylene glycol (MIRALAX / GLYCOLAX) packet Take 17 g by mouth daily as needed for moderate constipation. 04/12/17   Johnson, Clanford L, MD  raloxifene (EVISTA) 60 MG tablet Take 60 mg by mouth daily.    [provider]  rosuvastatin (CRESTOR) 20 MG tablet Take 20 mg by mouth daily.    [provider]    Family History Family History  Problem Relation Age of Onset  . Heart disease Father   . Kidney disease Mother        kidney cancer  . Colon cancer Daughter 73    Social History Social History   Tobacco Use  . Smoking status: Never Smoker  . Smokeless tobacco: Never Used  Substance Use Topics  . Alcohol use: No  . Drug use: No     Allergies   Other; Tape; Lipitor [atorvastatin calcium]; and Niaspan [niacin er]   Review of Systems Review of Systems  Constitutional: Positive for fatigue.  Respiratory: Positive for shortness of breath.   Gastrointestinal: Positive for diarrhea, nausea and vomiting.  All other systems reviewed and are negative.    Physical Exam Updated Vital Signs BP 131/62 (BP Location: Left Arm)   Pulse 83   Temp (!) 100.4 F (38 C) (Rectal)   Resp 19   Wt 92.5 kg (204 lb)   SpO2 93% Comment: high flow with humidification  BMI  37.31 kg/m   Physical Exam  Constitutional: She appears well-developed. She appears distressed.  HENT:  Head: Normocephalic and atraumatic.  Right Ear: External ear normal.  Left Ear: External ear normal.  Nose: Nose normal.  Mouth/Throat: Mucous membranes are dry.  Eyes: Conjunctivae and EOM are normal. Pupils are equal, round, and reactive to light.  Neck: Normal range of motion. Neck supple.  Cardiovascular: Normal rate, normal heart sounds and intact distal pulses. An irregularly irregular rhythm present.  Pulmonary/Chest: Tachypnea noted. She is in respiratory distress. She has wheezes.  Abdominal: Soft. Bowel sounds are normal.  Musculoskeletal: Normal range of motion.  Neurological: She is alert.  Skin: Skin is warm and dry. Capillary refill takes less than 2 seconds.  Psychiatric: She has a normal mood and affect. Her behavior is normal. Judgment and thought content normal.  Nursing note and vitals reviewed.    ED Treatments / Results  Labs (all labs ordered are listed, but only abnormal results are displayed) Labs Reviewed  CBC WITH DIFFERENTIAL/PLATELET - Abnormal; Notable for the following components:      Result Value   WBC 10.9 (*)    MCHC 29.7 (*)    RDW 18.8 (*)    Monocytes Absolute 1.2 (*)    All other components within normal limits  BASIC METABOLIC PANEL - Abnormal; Notable for the following components:   Potassium 2.8 (*)    Chloride 93 (*)    Glucose, Bld 296 (*)    BUN 22 (*)    Creatinine, Ser 1.60 (*)    GFR calc non Af Amer 28 (*)    GFR calc Af Amer 33 (*)    All  other components within normal limits  I-STAT CG4 LACTIC ACID, ED - Abnormal; Notable for the following components:   Lactic Acid, Venous 4.25 (*)    All other components within normal limits  CBG MONITORING, ED - Abnormal; Notable for the following components:   Glucose-Capillary 277 (*)    All other components within normal limits  CULTURE, BLOOD (ROUTINE X 2)  CULTURE, BLOOD  (ROUTINE X 2)  C DIFFICILE QUICK SCREEN W PCR REFLEX  TROPONIN I  MAGNESIUM  INFLUENZA PANEL BY PCR (TYPE A & B)  I-STAT CG4 LACTIC ACID, ED    EKG  EKG Interpretation  Date/Time:  Wednesday May 03 2017 08:59:30 EST Ventricular Rate:  98 PR Interval:    QRS Duration: 109 QT Interval:  450 QTC Calculation: 575 R Axis:   57 Text Interpretation:  Atrial fibrillation Ventricular premature complex Low voltage, precordial leads Prolonged QT interval No significant change since last tracing Confirmed by Isla Pence [phone]) on 05/03/2017 9:13:23 AM       Radiology Dg Chest Portable 1 View  Result Date: 05/03/2017 CLINICAL DATA:  Cough EXAM: PORTABLE CHEST 1 VIEW COMPARISON:  04/10/2017 FINDINGS: There is persistent right upper lobe airspace disease most concerning for pneumonia. There is diffuse bilateral interstitial thickening. There is no significant pleural effusion. There is no pneumothorax. There is stable cardiomegaly. The osseous structures are unremarkable. IMPRESSION: 1. Persistent right upper lobe pneumonia. 2. Bilateral mild interstitial thickening primarily at the lung bases which may reflect multi lobar pneumonia versus mild CHF. Electronically Signed   By: Kathreen Devoid   On: 05/03/2017 09:16    Procedures Procedures (including critical care time)  Medications Ordered in ED Medications  vancomycin (VANCOCIN) 1,500 mg in sodium chloride 0.9 % 500 mL IVPB (1,500 mg Intravenous New Bag/Given 05/03/17 1040)  vancomycin (VANCOCIN) IVPB 1000 mg/200 mL premix (not administered)  potassium chloride SA (K-DUR,KLOR-CON) CR tablet 40 mEq (not administered)  ipratropium-albuterol (DUONEB) 0.5-2.5 (3) MG/3ML nebulizer solution 3 mL (3 mLs Nebulization Given 05/03/17 1029)  ceFEPIme (MAXIPIME) 1 g in sodium chloride 0.9 % 100 mL IVPB (0 g Intravenous Stopped 05/03/17 1037)  sodium chloride 0.9 % bolus 1,000 mL (1,000 mLs Intravenous New Bag/Given 05/03/17 0955)  acetaminophen  (TYLENOL) tablet 1,000 mg (1,000 mg Oral Given 05/03/17 1030)     Initial Impression / Assessment and Plan / ED Course  I have reviewed the triage vital signs and the nursing notes.  Pertinent labs & imaging results that were available during my care of the patient were reviewed by me and considered in my medical decision making (see chart for details).   Pt initially placed on a nrb at 15L, then was weaned to 7L oxygen via [place] with humidified air.  She is in the low 90s with that.  She looks more comfortable and is less confused.  CRITICAL CARE Performed by: Isla Pence   Total critical care time: 30 minutes  Critical care time was exclusive of separately billable procedures and treating other patients.  Critical care was necessary to treat or prevent imminent or life-threatening deterioration.  Critical care was time spent personally by me on the following activities: development of treatment plan with patient and/or surrogate as well as nursing, discussions with consultants, evaluation of patient's response to treatment, examination of patient, obtaining history from patient or surrogate, ordering and performing treatments and interventions, ordering and review of laboratory studies, ordering and review of radiographic studies, pulse oximetry and re-evaluation of patient's condition.  She  was started on cefepime and vanc for HCAP as she was just in the hospital recently.  She has been on oral doxy and augmentin, so a cdiff has been ordered.  She has not yet produced a sample.  Pt d/w Dr. Roderic Palau (triad) for admission.  Final Clinical Impressions(s) / ED Diagnoses   Final diagnoses:  Multifocal pneumonia  Dehydration  Chronic atrial fibrillation (HCC)  Hypokalemia  AKI (acute kidney injury) ([place])  Fever, unspecified fever cause  Acute on chronic respiratory failure with hypoxia [facility])    ED Discharge Orders    None       Isla Pence, MD 05/03/17 1057

## 2017-05-04 DIAGNOSIS — R7881 Bacteremia: Secondary | ICD-10-CM | POA: Diagnosis present

## 2017-05-04 LAB — BLOOD CULTURE ID PANEL (REFLEXED)
ACINETOBACTER BAUMANNII: NOT DETECTED
CANDIDA ALBICANS: NOT DETECTED
Candida glabrata: NOT DETECTED
Candida krusei: NOT DETECTED
Candida parapsilosis: NOT DETECTED
Candida tropicalis: NOT DETECTED
ENTEROBACTERIACEAE SPECIES: NOT DETECTED
Enterobacter cloacae complex: NOT DETECTED
Enterococcus species: NOT DETECTED
Escherichia coli: NOT DETECTED
HAEMOPHILUS INFLUENZAE: NOT DETECTED
Klebsiella oxytoca: NOT DETECTED
Klebsiella pneumoniae: NOT DETECTED
Listeria monocytogenes: NOT DETECTED
METHICILLIN RESISTANCE: DETECTED — AB
NEISSERIA MENINGITIDIS: NOT DETECTED
PSEUDOMONAS AERUGINOSA: NOT DETECTED
Proteus species: NOT DETECTED
STREPTOCOCCUS PNEUMONIAE: NOT DETECTED
STREPTOCOCCUS PYOGENES: NOT DETECTED
STREPTOCOCCUS SPECIES: NOT DETECTED
Serratia marcescens: NOT DETECTED
Staphylococcus aureus (BCID): NOT DETECTED
Staphylococcus species: DETECTED — AB
Streptococcus agalactiae: NOT DETECTED

## 2017-05-04 LAB — GLUCOSE, CAPILLARY
GLUCOSE-CAPILLARY: 293 mg/dL — AB (ref 65–99)
GLUCOSE-CAPILLARY: 354 mg/dL — AB (ref 65–99)
Glucose-Capillary: 280 mg/dL — ABNORMAL HIGH (ref 65–99)
Glucose-Capillary: 334 mg/dL — ABNORMAL HIGH (ref 65–99)

## 2017-05-04 LAB — CBC
HCT: 39.1 % (ref 36.0–46.0)
Hemoglobin: 11.2 g/dL — ABNORMAL LOW (ref 12.0–15.0)
MCH: 26.1 pg (ref 26.0–34.0)
MCHC: 28.6 g/dL — ABNORMAL LOW (ref 30.0–36.0)
MCV: 91.1 fL (ref 78.0–100.0)
PLATELETS: 134 10*3/uL — AB (ref 150–400)
RBC: 4.29 MIL/uL (ref 3.87–5.11)
RDW: 17.9 % — ABNORMAL HIGH (ref 11.5–15.5)
WBC: 9.4 10*3/uL (ref 4.0–10.5)

## 2017-05-04 LAB — STREP PNEUMONIAE URINARY ANTIGEN: Strep Pneumo Urinary Antigen: NEGATIVE

## 2017-05-04 LAB — COMPREHENSIVE METABOLIC PANEL
ALT: 14 U/L (ref 14–54)
AST: 21 U/L (ref 15–41)
Albumin: 2.6 g/dL — ABNORMAL LOW (ref 3.5–5.0)
Alkaline Phosphatase: 62 U/L (ref 38–126)
Anion gap: 9 (ref 5–15)
BUN: 17 mg/dL (ref 6–20)
CALCIUM: 8.4 mg/dL — AB (ref 8.9–10.3)
CHLORIDE: 102 mmol/L (ref 101–111)
CO2: 26 mmol/L (ref 22–32)
CREATININE: 1.14 mg/dL — AB (ref 0.44–1.00)
GFR, EST AFRICAN AMERICAN: 49 mL/min — AB (ref >60)
GFR, EST NON AFRICAN AMERICAN: 42 mL/min — AB (ref >60)
Glucose, Bld: 339 mg/dL — ABNORMAL HIGH (ref 65–99)
Potassium: 4.5 mmol/L (ref 3.5–5.1)
Sodium: 137 mmol/L (ref 135–145)
Total Bilirubin: 0.5 mg/dL (ref 0.3–1.2)
Total Protein: 5.7 g/dL — ABNORMAL LOW (ref 6.5–8.1)

## 2017-05-04 LAB — LACTIC ACID, PLASMA: LACTIC ACID, VENOUS: 1.3 mmol/L (ref 0.5–1.9)

## 2017-05-04 MED ORDER — ORAL CARE MOUTH RINSE
15.0000 mL | Freq: Two times a day (BID) | OROMUCOSAL | Status: DC
  Administered 2017-05-04 – 2017-05-05 (×3): 15 mL via OROMUCOSAL

## 2017-05-04 MED ORDER — PIPERACILLIN-TAZOBACTAM 3.375 G IVPB
3.3750 g | Freq: Three times a day (TID) | INTRAVENOUS | Status: DC
  Administered 2017-05-04 – 2017-05-06 (×7): 3.375 g via INTRAVENOUS
  Filled 2017-05-04 (×7): qty 50

## 2017-05-04 NOTE — Progress Notes (Signed)
SLP Cancellation Note  Patient Details Name: [name] MRN: [id number] DOB: [birth year]   Cancelled treatment:       Reason Eval/Treat Not Completed: Other (comment)(D/W Dr. Roderic Palau RE: concerns for wheezing post meals, rec MBS); Plan for MBSS tomorrow AM due to MD concerns.  Thank you,  Genene Churn, [place]    [place] 05/04/2017, 9:07 PM

## 2017-05-04 NOTE — Evaluation (Signed)
Clinical/Bedside Swallow Evaluation Patient Details  Name: [name] MRN: [id number] Date of Birth: 1928-10-22  Today's Date: 05/04/2017 Time: SLP Start Time (ACUTE ONLY): 9767 SLP Stop Time (ACUTE ONLY): 1639 SLP Time Calculation (min) (ACUTE ONLY): 32 min  Past Medical History:  Past Medical History:  Diagnosis Date  . Anemia   . Atrial fibrillation ([place])   . Back pain   . Bacterial pneumonia   . Cancer (HCC)    Skin   . Chronic respiratory failure ([place])   . Cognitive communication deficit   . COPD (chronic obstructive pulmonary disease) ([place])   . Dementia   . Dementia   . Depression   . Diabetes mellitus without complication ([place])   . Diverticulosis   . Diverticulosis   . Fatty liver   . Fibromyalgia   . Fibromyalgia   . Gastric polyp 09/08/11   at the anastomosis-inflammatory  . Generalized anxiety disorder   . Generalized anxiety disorder   . GERD (gastroesophageal reflux disease)   . GERD (gastroesophageal reflux disease)   . 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 Hypercholesteremia   . Hypertension   . Insomnia   . Internal hemorrhoids   . Muscle weakness   . Neuropathy   . On home O2    qhs  . Osteoporosis   . Rheumatoid arteritis   . Ulcer    stomach  . Vitamin D deficiency    Past Surgical History:  Past Surgical History:  Procedure Laterality Date  . ABDOMINAL HYSTERECTOMY    . ABDOMINAL SURGERY     removed patial stomach from ulcer  . COLONOSCOPY  04-2001   internal hemorrhoids, panocolonic diverticulosis, and colon polyps   . UPPER GASTROINTESTINAL ENDOSCOPY     HPI:  84 y.o. female with medical history significant of rheumatoid arthritis, chronic respiratory failure, COPD, chronic kidney disease stage III who was recently discharged from the hospital after being treated for healthcare associated pneumonia and sent to nursing home.  Patient had apparently initially been doing well.  She developed a significant leukocytosis and chest  x-ray was repeated.  There was concern for persistent pneumonia and she was started on a course of Augmentin.  Her course at the nursing home waxed and waned.  More recently, she became increasingly short of breath and was noted to be hypoxic.  She became somnolent and lethargic.  She also had vomiting and diarrhea which occurred in the last 24 hours.  On arrival to the emergency room she was noted to have a mild fever at 100.7.  Chest x-ray indicated persistent pneumonia.  Urinalysis did not show any signs of infection.  Influenza test was negative. BSE ordered to r/o risk for aspiration d/t recurrent PNA and hx of COPD; currently on clear liquid diet. 05/03/17 CXR indicated Persistent right upper lobe pneumonia. 2. Bilateral mild interstitial thickening primarily at the lung bases which may reflect multi lobar pneumonia versus mild CHF  Assessment / Plan / Recommendation Clinical Impression   Pt without overt s/s of aspiration noted during BSE with exception of delayed cough after all intake x1; pt appeared to exhibit a normal oropharyngeal swallow during entire BSE until end of PO intake; pt required minimal verbal cueing for slow intake and small bites/sips; discussed breathing/swallowing reciprocity with daughter and need for f/u for diet tolerance with ST and possible f/u with ST at SNF for diet tolerance to assess fatigue and implementation of swallowing/aspiration precautions/dysphagia management. Recommend Regular/thin liquids with ST f/u for diet tolerance  while in acute setting. SLP Visit Diagnosis: Dysphagia, unspecified (R13.10)    Aspiration Risk  Mild aspiration risk    Diet Recommendation   Heart healthy/thin liquids  Medication Administration: Whole meds with liquid    Other  Recommendations Oral Care Recommendations: Oral care BID   Follow up Recommendations Skilled Nursing facility      Frequency and Duration min 2x/week  1 week       Prognosis Prognosis for Safe Diet  Advancement: Good      Swallow Study   General Date of Onset: 05/03/17 HPI: 84 y.o. female with medical history significant of rheumatoid arthritis, chronic respiratory failure, COPD, chronic kidney disease stage III who was recently discharged from the hospital after being treated for healthcare associated pneumonia and sent to nursing home.  Patient had apparently initially been doing well.  She developed a significant leukocytosis and chest x-ray was repeated.  There was concern for persistent pneumonia and she was started on a course of Augmentin.  Her course at the nursing home waxed and waned.  More recently, she became increasingly short of breath and was noted to be hypoxic.  She became somnolent and lethargic.  She also had vomiting and diarrhea which occurred in the last 24 hours.  On arrival to the emergency room she was noted to have a mild fever at 100.7.  Chest x-ray indicated persistent pneumonia.  Urinalysis did not show any signs of infection.  Influenza test was negative. Type of Study: Bedside Swallow Evaluation Previous Swallow Assessment: n/a Diet Prior to this Study: Thin liquids Temperature Spikes Noted: No Respiratory Status: Nasal cannula(HFNC) History of Recent Intubation: No Behavior/Cognition: Alert;Cooperative;Requires cueing;Other (Comment)(wears HA's) Oral Cavity Assessment: Within Functional Limits Oral Care Completed by SLP: No Oral Cavity - Dentition: Adequate natural dentition Vision: Functional for self-feeding Self-Feeding Abilities: Able to feed self Patient Positioning: Upright in bed Baseline Vocal Quality: Normal Volitional Cough: Strong Volitional Swallow: Able to elicit    Oral/Motor/Sensory Function Overall Oral Motor/Sensory Function: Within functional limits   Ice Chips Ice chips: Within functional limits Presentation: Spoon   Thin Liquid Thin Liquid: Within functional limits Presentation: Cup;Straw    Nectar Thick Nectar Thick Liquid: Not  tested   Honey Thick Honey Thick Liquid: Not tested   Puree Puree: Within functional limits Presentation: Self Fed   Solid      Solid: Impaired Presentation: Self Fed Pharyngeal Phase Impairments: Cough - Delayed Other Comments: only noted after intake of all consistencies        Elvina Sidle, M.S., CCC-SLP 05/04/2017,5:00 PM

## 2017-05-04 NOTE — Progress Notes (Signed)
PROGRESS NOTE    [name]  IPJ:[id number] DOB: [birth year] DOA: 05/03/2017 PCP: Celene Squibb, MD    Brief Narrative:  84 year old female with a history of oxygen dependent COPD, chronic kidney disease stage III, presents to the hospital with recurrent pneumonia.  Suspect aspiration pneumonia.  On intravenous antibiotics.  She was also noted to have acute kidney injury on chronic kidney disease related to dehydration.  Started on IV fluids.  She has 1 out of 2 positive blood cultures for gram-positive cocci.  Continue to follow further identifications.  Clinically, she is improving.  Speech therapy following to evaluate for aspiration   Assessment & Plan:   Active Problems:   Fibromyalgia   COPD (chronic obstructive pulmonary disease) (HCC)   GERD (gastroesophageal reflux disease)   Hypokalemia   Acute encephalopathy   AKI (acute kidney injury) ([place])   Acute gastroenteritis   Peripheral neuropathy   Dementia   Acute on chronic respiratory failure with hypoxia (HCC)   CKD (chronic kidney disease), stage III (HCC)   HCAP (healthcare-associated pneumonia)   Diabetes type 2, controlled ([place])   Atrial fibrillation, chronic (HCC)   Rheumatoid arthritis ([place])   Sepsis ([place])   Bacteremia due to Gram-positive bacteria   1. Acute on chronic respiratory failure with hypoxia.  Related to pneumonia and COPD.  Chronically on 2-3 L.  Initially requiring 7 L high flow nasal cannula.  She has since been weaned down to 5 L.  We will continue current treatments. 2. Sepsis.  Related to pneumonia.  Blood pressures have improved with IV hydration.  Lactic acid has normalized.  Blood cultures show 1 out of 2 sets positive for gram-positive cocci.  Continue on vancomycin.  Follow-up sensitivities. 3. Pneumonia, suspect aspiration.  Patient did have coughing/wheezing after eating and drink continue on vancomycin and Zosyn.  Speech therapy following. 4. COPD.  Overall wheezing is better today.   Continue bronchodilators and intravenous steroids for now. 5. Acute kidney injury on chronic kidney disease stage III.  Creatinine has improved with hydration.  Urinalysis is unrevealing.  Continue current treatments. 6. Vomiting and diarrhea.  Possibly related to a viral illness.  She has not had any significant diarrhea since admission.  We will discontinue enteric precautions. 7. Chronic atrial fibrillation.  Anticoagulation with Eliquis.  Blood pressures are still on the lower side.  Continue to hold diltiazem. 8. Diabetes.  On sliding scale insulin.  Blood sugars elevated due to steroids.  Continue sliding scale 9. Rheumatoid arthritis.  Chronically on methotrexate.  Will hold while in the hospital. 10. Peripheral neuropathy.  Related to diabetes.  Continue on Lyrica 11. Dementia.  Has some confusion but otherwise does not have agitation at this time.  Continue to monitor. 12. Hypokalemia.  Improved with replacement.  Lasix currently on hold.   DVT prophylaxis: Eliquis Code Status: DNR Family Communication: Discussed with daughter at the bedside Disposition Plan: Return to nursing home on discharge   Consultants:   Speech therapy  Procedures:     Antimicrobials:   Vancomycin 2/20 >  Zosyn 2/21 >   Subjective: More awake today and interactive.  Appears to have coughing and wheezing after trying to eat or drink.  Objective: Vitals:   05/04/17 0247 05/04/17 0604 05/04/17 0922 05/04/17 1423  BP:  106/79  110/72  Pulse:  80  69  Resp:    20  Temp:  97.6 F (36.4 C)  98.3 F (36.8 C)  TempSrc:  Oral  Oral  SpO2: 96% 97% 95% 95%  Weight:      Height:        Intake/Output Summary (Last 24 hours) at 05/04/2017 1759 Last data filed at 05/04/2017 1537 Gross per 24 hour  Intake 3562.5 ml  Output 2300 ml  Net 1262.5 ml   Filed Weights   05/03/17 0854  Weight: 92.5 kg (204 lb)    Examination:  General exam: Appears calm and comfortable  Respiratory system:, Mild  wheezing upper airway, otherwise clear. Respiratory effort normal. Cardiovascular system: S1 & S2 heard, RRR. No JVD, murmurs, rubs, gallops or clicks. No pedal edema. Gastrointestinal system: Abdomen is nondistended, soft and nontender. No organomegaly or masses felt. Normal bowel sounds heard. Central nervous system: Alert and oriented. No focal neurological deficits. Extremities: Symmetric 5 x 5 power. Skin: No rashes, lesions or ulcers Psychiatry: More awake, pleasant, intermittently confused    Data Reviewed: I have personally reviewed following labs and imaging studies  CBC: Recent Labs  Lab 05/03/17 0914 05/04/17 0452  WBC 10.9* 9.4  NEUTROABS 7.5  --   HGB 12.5 11.2*  HCT 42.1 39.1  MCV 89.8 91.1  PLT 169 431*   Basic Metabolic Panel: Recent Labs  Lab 05/01/17 0015 05/03/17 0914 05/04/17 0452  NA 142 136 137  K 3.3* 2.8* 4.5  CL 98* 93* 102  CO2 37* 29 26  GLUCOSE 270* 296* 339*  BUN 24* 22* 17  CREATININE 1.43* 1.60* 1.14*  CALCIUM 9.3 9.3 8.4*  MG  --  1.7  --    GFR: Estimated Creatinine Clearance: 37.5 mL/min (A) (by C-G formula based on SCr of 1.14 mg/dL (H)). Liver Function Tests: Recent Labs  Lab 05/04/17 0452  AST 21  ALT 14  ALKPHOS 62  BILITOT 0.5  PROT 5.7*  ALBUMIN 2.6*   No results for input(s): LIPASE, AMYLASE in the last 168 hours. No results for input(s): AMMONIA in the last 168 hours. Coagulation Profile: Recent Labs  Lab 05/03/17 2003  INR 1.26   Cardiac Enzymes: Recent Labs  Lab 05/03/17 0914  TROPONINI <0.03   BNP (last 3 results) No results for input(s): PROBNP in the last 8760 hours. HbA1C: No results for input(s): HGBA1C in the last 72 hours. CBG: Recent Labs  Lab 05/03/17 0920 05/03/17 2214 05/04/17 0754 05/04/17 1119 05/04/17 1706  GLUCAP 277* 146* 293* 354* 280*   Lipid Profile: No results for input(s): CHOL, HDL, LDLCALC, TRIG, CHOLHDL, LDLDIRECT in the last 72 hours. Thyroid Function Tests: No  results for input(s): TSH, T4TOTAL, FREET4, T3FREE, THYROIDAB in the last 72 hours. Anemia Panel: No results for input(s): VITAMINB12, FOLATE, FERRITIN, TIBC, IRON, RETICCTPCT in the last 72 hours. Sepsis Labs: Recent Labs  Lab 05/03/17 5400 05/03/17 1142 05/03/17 2003 05/03/17 2316  PROCALCITON  --   --  0.39  --   LATICACIDVEN 4.25* 2.65* 1.1 1.3    Recent Results (from the past 240 hour(s))  Culture, blood (routine x 2)     Status: None (Preliminary result)   Collection Time: 05/03/17  9:27 AM  Result Value Ref Range Status   Specimen Description   Final    BLOOD RIGHT HAND Performed at [facility], [address] [place], [place] [address]    Special Requests   Final    BOTTLES DRAWN AEROBIC AND ANAEROBIC Blood Culture adequate volume Performed at [facility], [address], [place], [place] [address]    Culture  Setup Time   Final    GRAM POSITIVE  COCCI IN BOTH AEROBIC AND ANAEROBIC BOTTLES Gram Stain Report Called to,Read Back By and Verified With: LIGHTNER,N @ 1610 AT 2.21.19 BY BOWMAN,L Organism ID to follow Performed at [facility], [place] [address], [place], [place] [address]    Culture GRAM POSITIVE COCCI  Final   Report Status PENDING  Incomplete  Blood Culture ID Panel (Reflexed)     Status: Abnormal   Collection Time: 05/03/17  9:27 AM  Result Value Ref Range Status   Enterococcus species NOT DETECTED NOT DETECTED Final   Listeria monocytogenes NOT DETECTED NOT DETECTED Final   Staphylococcus species DETECTED (A) NOT DETECTED Final    Comment: Methicillin (oxacillin) resistant coagulase negative staphylococcus. Possible blood culture contaminant (unless isolated from more than one blood culture draw or clinical case suggests pathogenicity). No antibiotic treatment is indicated for blood  culture contaminants. CRITICAL RESULT CALLED TO, READ BACK BY AND VERIFIED WITH: S. Hurth Pharm.D. 10:25 05/04/17 ([email or web address])    Staphylococcus aureus NOT DETECTED NOT  DETECTED Final   Methicillin resistance DETECTED (A) NOT DETECTED Final    Comment: CRITICAL RESULT CALLED TO, READ BACK BY AND VERIFIED WITH: S. Hurth Pharm.D. 10:25 05/04/17 ([email or web address])    Streptococcus species NOT DETECTED NOT DETECTED Final   Streptococcus agalactiae NOT DETECTED NOT DETECTED Final   Streptococcus pneumoniae NOT DETECTED NOT DETECTED Final   Streptococcus pyogenes NOT DETECTED NOT DETECTED Final   Acinetobacter baumannii NOT DETECTED NOT DETECTED Final   Enterobacteriaceae species NOT DETECTED NOT DETECTED Final   Enterobacter cloacae complex NOT DETECTED NOT DETECTED Final   Escherichia coli NOT DETECTED NOT DETECTED Final   Klebsiella oxytoca NOT DETECTED NOT DETECTED Final   Klebsiella pneumoniae NOT DETECTED NOT DETECTED Final   Proteus species NOT DETECTED NOT DETECTED Final   Serratia marcescens NOT DETECTED NOT DETECTED Final   Haemophilus influenzae NOT DETECTED NOT DETECTED Final   Neisseria meningitidis NOT DETECTED NOT DETECTED Final   Pseudomonas aeruginosa NOT DETECTED NOT DETECTED Final   Candida albicans NOT DETECTED NOT DETECTED Final   Candida glabrata NOT DETECTED NOT DETECTED Final   Candida krusei NOT DETECTED NOT DETECTED Final   Candida parapsilosis NOT DETECTED NOT DETECTED Final   Candida tropicalis NOT DETECTED NOT DETECTED Final  Culture, blood (routine x 2)     Status: None (Preliminary result)   Collection Time: 05/03/17  9:34 AM  Result Value Ref Range Status   Specimen Description LEFT ANTECUBITAL  Final   Special Requests   Final    BOTTLES DRAWN AEROBIC AND ANAEROBIC Blood Culture adequate volume   Culture   Final    NO GROWTH < 24 HOURS Performed at [facility], [address] [place], [place] [address]    Report Status PENDING  Incomplete         Radiology Studies: Dg Chest Portable 1 View  Result Date: 05/03/2017 CLINICAL DATA:  Cough EXAM: PORTABLE CHEST 1 VIEW COMPARISON:  04/10/2017 FINDINGS: There is persistent  right upper lobe airspace disease most concerning for pneumonia. There is diffuse bilateral interstitial thickening. There is no significant pleural effusion. There is no pneumothorax. There is stable cardiomegaly. The osseous structures are unremarkable. IMPRESSION: 1. Persistent right upper lobe pneumonia. 2. Bilateral mild interstitial thickening primarily at the lung bases which may reflect multi lobar pneumonia versus mild CHF. Electronically Signed   By: Kathreen Devoid   On: 05/03/2017 09:16        Scheduled Meds: . apixaban  2.5 mg Oral BID  .  DULoxetine  60 mg Oral Daily  . guaiFENesin  600 mg Oral BID  . insulin aspart  0-15 Units Subcutaneous TID WC  . insulin aspart  0-5 Units Subcutaneous QHS  . ipratropium-albuterol  3 mL Nebulization Q6H  . LORazepam  0.25 mg Oral BID  . mouth rinse  15 mL Mouth Rinse BID  . methylPREDNISolone (SOLU-MEDROL) injection  60 mg Intravenous Q12H  . pantoprazole  40 mg Oral Daily  . potassium chloride SA  40 mEq Oral Once  . potassium chloride SA  40 mEq Oral BID  . pregabalin  100 mg Oral TID   Continuous Infusions: . sodium chloride 75 mL/hr at 05/04/17 0646  . piperacillin-tazobactam (ZOSYN)  IV 3.375 g (05/04/17 1739)  . vancomycin Stopped (05/04/17 1109)     LOS: 1 day    Time spent: 52mins    Kathie Dike, MD [facility] Pager [phone]  If 7PM-7AM, please contact night-coverage [email or web address] Password [facility] 05/04/2017, 5:59 PM

## 2017-05-04 NOTE — Progress Notes (Signed)
Patient had no diarrhea or any BMs over night, unable to get a sample to test for C. Diff.

## 2017-05-04 NOTE — Progress Notes (Signed)
CRITICAL VALUE ALERT  Critical Value:  Positive blood cultures.  Date & Time Notied:  05/04/17 0605  Provider Notified: Myna Hidalgo, MD  Orders Received/Actions taken: No new orders at this time.

## 2017-05-04 NOTE — Progress Notes (Signed)
Pharmacy Antibiotic Note  [name] is a 84 y.o. female admitted on 05/03/2017 with pneumonia with concern for aspiration.   Pharmacy has been consulted for vancomycin and zosyn dosing.  Plan: Vancomycin 1500 mg IV X 1 then 1gm IV q24 hours Zosyn 3.375gm IV q8h, EID F/u renal function, cultures and clinical course  Height: 5\' 2"  (157.5 cm) Weight: 204 lb (92.5 kg) IBW/kg (Calculated) : 50.1  Temp (24hrs), Avg:97.7 F (36.5 C), Min:97.6 F (36.4 C), Max:97.9 F (36.6 C)  Recent Labs  Lab 05/01/17 0015 05/03/17 0914 05/03/17 0926 05/03/17 1142 05/03/17 2003 05/03/17 2316 05/04/17 0452  WBC  --  10.9*  --   --   --   --  9.4  CREATININE 1.43* 1.60*  --   --   --   --  1.14*  LATICACIDVEN  --   --  4.25* 2.65* 1.1 1.3  --     Estimated Creatinine Clearance: 37.5 mL/min (A) (by C-G formula based on SCr of 1.14 mg/dL (H)).    Allergies  Allergen Reactions  . Other     Band Aids   . Tape   . Lipitor [Atorvastatin Calcium] Rash and Other (See Comments)    Muscle pain  . Niaspan [Niacin Er] Rash and Other (See Comments)    Muscle pain   Antimicrobials this admission: vanc 2/20 >>  cefepime 2/20 >> 2/21 Zosyn 2/21 >>  Microbiology results: 2/20 BCx:   Thank you for allowing pharmacy to be a part of this patient's care.  Hart Robinsons A 05/04/2017 12:40 PM

## 2017-05-05 ENCOUNTER — Inpatient Hospital Stay (HOSPITAL_COMMUNITY): Payer: Medicare Other

## 2017-05-05 LAB — BASIC METABOLIC PANEL
Anion gap: 9 (ref 5–15)
BUN: 14 mg/dL (ref 6–20)
CHLORIDE: 106 mmol/L (ref 101–111)
CO2: 24 mmol/L (ref 22–32)
Calcium: 9.5 mg/dL (ref 8.9–10.3)
Creatinine, Ser: 1.16 mg/dL — ABNORMAL HIGH (ref 0.44–1.00)
GFR calc non Af Amer: 41 mL/min — ABNORMAL LOW (ref >60)
GFR, EST AFRICAN AMERICAN: 48 mL/min — AB (ref >60)
Glucose, Bld: 260 mg/dL — ABNORMAL HIGH (ref 65–99)
Potassium: 5 mmol/L (ref 3.5–5.1)
SODIUM: 139 mmol/L (ref 135–145)

## 2017-05-05 LAB — GLUCOSE, CAPILLARY
GLUCOSE-CAPILLARY: 250 mg/dL — AB (ref 65–99)
GLUCOSE-CAPILLARY: 318 mg/dL — AB (ref 65–99)
GLUCOSE-CAPILLARY: 358 mg/dL — AB (ref 65–99)
Glucose-Capillary: 316 mg/dL — ABNORMAL HIGH (ref 65–99)

## 2017-05-05 MED ORDER — PREDNISONE 20 MG PO TABS
40.0000 mg | ORAL_TABLET | Freq: Every day | ORAL | Status: DC
  Filled 2017-05-05: qty 2

## 2017-05-05 MED ORDER — LINAGLIPTIN 5 MG PO TABS
5.0000 mg | ORAL_TABLET | Freq: Every day | ORAL | Status: DC
  Administered 2017-05-06: 5 mg via ORAL
  Filled 2017-05-05: qty 1

## 2017-05-05 MED ORDER — PREDNISONE 20 MG PO TABS
40.0000 mg | ORAL_TABLET | Freq: Every day | ORAL | Status: DC
  Administered 2017-05-05 – 2017-05-06 (×2): 40 mg via ORAL
  Filled 2017-05-05: qty 2

## 2017-05-05 NOTE — Care Management Important Message (Signed)
Important Message  Patient Details  Name: NAKEA GOUGER MRN: [id number] Date of Birth: Aug 17, 1928   Medicare Important Message Given:  Yes    Sherald Barge, RN 05/05/2017, 9:50 AM

## 2017-05-05 NOTE — Progress Notes (Addendum)
PROGRESS NOTE    JESELLE Dunn  WRU:[id number] DOB: 1929-02-19 DOA: 05/03/2017 PCP: Celene Squibb, MD    Brief Narrative:  84 year old female with a history of oxygen dependent COPD, chronic kidney disease stage III, presents to the hospital with recurrent pneumonia.  Suspect aspiration pneumonia.  On intravenous antibiotics.  She was also noted to have acute kidney injury on chronic kidney disease related to dehydration.  Started on IV fluids.  She has 1 out of 2 positive blood cultures for gram-positive cocci.  Continue to follow further identifications.  Clinically, she is improving.  Speech therapy following to evaluate for aspiration  Assessment & Plan:   Active Problems:   Fibromyalgia   COPD (chronic obstructive pulmonary disease) (HCC)   GERD (gastroesophageal reflux disease)   Hypokalemia   Acute encephalopathy   AKI (acute kidney injury) ([place])   Acute gastroenteritis   Peripheral neuropathy   Dementia   Acute on chronic respiratory failure with hypoxia (HCC)   CKD (chronic kidney disease), stage III (HCC)   HCAP (healthcare-associated pneumonia)   Diabetes type 2, controlled ([place])   Atrial fibrillation, chronic (HCC)   Rheumatoid arthritis ([place])   Sepsis ([place])   Bacteremia due to Gram-positive bacteria    1. Acute on chronic respiratory failure with hypoxia.  Related to pneumonia and COPD.  Chronically on 2-3 L.  Initially requiring 7 L high flow nasal cannula.  She has since been weaned down to 4 L.  We will continue current treatments. 2. Sepsis.  Related to pneumonia.  Blood pressures have improved with IV hydration.  Lactic acid has normalized.  Blood cultures show 1 out of 2 sets positive for gram-positive cocci.  Continue on vancomycin.  Follow-up sensitivities. 3. Pneumonia, suspect aspiration.    Currently on IV antibiotics.  Seen by speech therapy with no obvious aspiration.  Recommended dysphagia 3 diet.  Clinically she is improving. 4. COPD.  Overall  wheezing is better today.  Continue bronchodilators.  Steroids have been changed prednisone taper. 5. Acute kidney injury on chronic kidney disease stage III.  Creatinine has improved with hydration.  Urinalysis is unrevealing.  Continue current treatments. 6. Vomiting and diarrhea.  Possibly related to a viral illness.  She has not had any significant diarrhea since admission.  We will discontinue enteric precautions. 7. Chronic atrial fibrillation.  Anticoagulation with Eliquis.  Blood pressures are still on the lower side.  Continue to hold diltiazem.  Heart rate currently stable. 8. Diabetes.  On sliding scale insulin.  Blood sugars elevated due to steroids.  Continue sliding scale.  Should hopefully improve as steroids are tapered 9. Rheumatoid arthritis.  Chronically on methotrexate.  Will hold while in the hospital. 10. Peripheral neuropathy.  Related to diabetes.  Continue on Lyrica 11. Dementia.    Patient had acute metabolic encephalopathy due to dehydration superimposed on dementia on admission.  This is improved back to her baseline with IV fluids.  Has some confusion but otherwise does not have agitation at this time.  Continue to monitor. 12. Hypokalemia.    Resolved.  Lasix currently on hold.  DVT prophylaxis: Eliquis Code Status: DNR Family Communication: Discussed with daughter on 2/22 Disposition Plan: Return to nursing home on discharge  Consultants:   Speech therapy  Procedures:     Antimicrobials:   Vancomycin 2/20 >  Zosyn 2/21 >   Subjective: Denies any shortness of breath or cough.  Confused at times.  Objective: Vitals:   05/05/17 0121 05/05/17 0602 05/05/17  1508 05/05/17 1509  BP:  (!) 141/70 (!) 117/47   Pulse:  81 92   Resp:      Temp:  98 F (36.7 C) 98 F (36.7 C)   TempSrc:  Oral Oral   SpO2: 95% 99% 97% 97%  Weight:      Height:        Intake/Output Summary (Last 24 hours) at 05/05/2017 1833 Last data filed at 05/05/2017 1632 Gross per  24 hour  Intake 360 ml  Output -  Net 360 ml   Filed Weights   05/03/17 0854  Weight: 92.5 kg (204 lb)    Examination:  General exam: Alert, awake, oriented x 3 Respiratory system: Clear to auscultation. Respiratory effort normal. Cardiovascular system:RRR. No murmurs, rubs, gallops. Gastrointestinal system: Abdomen is nondistended, soft and nontender. No organomegaly or masses felt. Normal bowel sounds heard. Central nervous system:  No focal neurological deficits. Extremities: No C/C/E, +pedal pulses Skin: No rashes, lesions or ulcers Psychiatry: Confused, overall pleasant    Data Reviewed: I have personally reviewed following labs and imaging studies  CBC: Recent Labs  Lab 05/03/17 0914 05/04/17 0452  WBC 10.9* 9.4  NEUTROABS 7.5  --   HGB 12.5 11.2*  HCT 42.1 39.1  MCV 89.8 91.1  PLT 169 314*   Basic Metabolic Panel: Recent Labs  Lab 05/01/17 0015 05/03/17 0914 05/04/17 0452 05/05/17 0601  NA 142 136 137 139  K 3.3* 2.8* 4.5 5.0  CL 98* 93* 102 106  CO2 37* 29 26 24   GLUCOSE 270* 296* 339* 260*  BUN 24* 22* 17 14  CREATININE 1.43* 1.60* 1.14* 1.16*  CALCIUM 9.3 9.3 8.4* 9.5  MG  --  1.7  --   --    GFR: Estimated Creatinine Clearance: 36.9 mL/min (A) (by C-G formula based on SCr of 1.16 mg/dL (H)). Liver Function Tests: Recent Labs  Lab 05/04/17 0452  AST 21  ALT 14  ALKPHOS 62  BILITOT 0.5  PROT 5.7*  ALBUMIN 2.6*   No results for input(s): LIPASE, AMYLASE in the last 168 hours. No results for input(s): AMMONIA in the last 168 hours. Coagulation Profile: Recent Labs  Lab 05/03/17 2003  INR 1.26   Cardiac Enzymes: Recent Labs  Lab 05/03/17 0914  TROPONINI <0.03   BNP (last 3 results) No results for input(s): PROBNP in the last 8760 hours. HbA1C: No results for input(s): HGBA1C in the last 72 hours. CBG: Recent Labs  Lab 05/04/17 1706 05/04/17 2119 05/05/17 0801 05/05/17 1134 05/05/17 1758  GLUCAP 280* 334* 250* 318* 358*    Lipid Profile: No results for input(s): CHOL, HDL, LDLCALC, TRIG, CHOLHDL, LDLDIRECT in the last 72 hours. Thyroid Function Tests: No results for input(s): TSH, T4TOTAL, FREET4, T3FREE, THYROIDAB in the last 72 hours. Anemia Panel: No results for input(s): VITAMINB12, FOLATE, FERRITIN, TIBC, IRON, RETICCTPCT in the last 72 hours. Sepsis Labs: Recent Labs  Lab 05/03/17 9702 05/03/17 1142 05/03/17 2003 05/03/17 2316  PROCALCITON  --   --  0.39  --   LATICACIDVEN 4.25* 2.65* 1.1 1.3    Recent Results (from the past 240 hour(s))  Culture, blood (routine x 2)     Status: Abnormal (Preliminary result)   Collection Time: 05/03/17  9:27 AM  Result Value Ref Range Status   Specimen Description   Final    BLOOD RIGHT HAND Performed at [facility], [address], [place], [place] [address]    Special Requests   Final  BOTTLES DRAWN AEROBIC AND ANAEROBIC Blood Culture adequate volume Performed at [facility], [address], [place], [place] [address]    Culture  Setup Time   Final    GRAM POSITIVE COCCI IN BOTH AEROBIC AND ANAEROBIC BOTTLES Gram Stain Report Called to,Read Back By and Verified With: LIGHTNER,N @ [phone] AT 2.21.19 BY BOWMAN,L CRITICAL RESULT CALLED TO, READ BACK BY AND VERIFIED WITH: S. Hurth Pharm.D. 10:25 05/04/17 ([email or web address])    Culture (A)  Final    STAPHYLOCOCCUS SPECIES (COAGULASE NEGATIVE) THE SIGNIFICANCE OF ISOLATING THIS ORGANISM FROM A SINGLE SET OF BLOOD CULTURES WHEN MULTIPLE SETS ARE DRAWN IS UNCERTAIN. PLEASE NOTIFY THE MICROBIOLOGY DEPARTMENT WITHIN ONE WEEK IF SPECIATION AND SENSITIVITIES ARE REQUIRED. Performed at [facility], [place] [address], [place], [place] [address]    Report Status PENDING  Incomplete  Blood Culture ID Panel (Reflexed)     Status: Abnormal   Collection Time: 05/03/17  9:27 AM  Result Value Ref Range Status   Enterococcus species NOT DETECTED NOT DETECTED Final   Listeria monocytogenes NOT DETECTED NOT DETECTED Final    Staphylococcus species DETECTED (A) NOT DETECTED Final    Comment: Methicillin (oxacillin) resistant coagulase negative staphylococcus. Possible blood culture contaminant (unless isolated from more than one blood culture draw or clinical case suggests pathogenicity). No antibiotic treatment is indicated for blood  culture contaminants. CRITICAL RESULT CALLED TO, READ BACK BY AND VERIFIED WITH: S. Hurth Pharm.D. 10:25 05/04/17 ([email or web address])    Staphylococcus aureus NOT DETECTED NOT DETECTED Final   Methicillin resistance DETECTED (A) NOT DETECTED Final    Comment: CRITICAL RESULT CALLED TO, READ BACK BY AND VERIFIED WITH: S. Hurth Pharm.D. 10:25 05/04/17 ([email or web address])    Streptococcus species NOT DETECTED NOT DETECTED Final   Streptococcus agalactiae NOT DETECTED NOT DETECTED Final   Streptococcus pneumoniae NOT DETECTED NOT DETECTED Final   Streptococcus pyogenes NOT DETECTED NOT DETECTED Final   Acinetobacter baumannii NOT DETECTED NOT DETECTED Final   Enterobacteriaceae species NOT DETECTED NOT DETECTED Final   Enterobacter cloacae complex NOT DETECTED NOT DETECTED Final   Escherichia coli NOT DETECTED NOT DETECTED Final   Klebsiella oxytoca NOT DETECTED NOT DETECTED Final   Klebsiella pneumoniae NOT DETECTED NOT DETECTED Final   Proteus species NOT DETECTED NOT DETECTED Final   Serratia marcescens NOT DETECTED NOT DETECTED Final   Haemophilus influenzae NOT DETECTED NOT DETECTED Final   Neisseria meningitidis NOT DETECTED NOT DETECTED Final   Pseudomonas aeruginosa NOT DETECTED NOT DETECTED Final   Candida albicans NOT DETECTED NOT DETECTED Final   Candida glabrata NOT DETECTED NOT DETECTED Final   Candida krusei NOT DETECTED NOT DETECTED Final   Candida parapsilosis NOT DETECTED NOT DETECTED Final   Candida tropicalis NOT DETECTED NOT DETECTED Final  Culture, blood (routine x 2)     Status: None (Preliminary result)   Collection Time: 05/03/17  9:34 AM  Result Value Ref Range Status    Specimen Description LEFT ANTECUBITAL  Final   Special Requests   Final    BOTTLES DRAWN AEROBIC AND ANAEROBIC Blood Culture adequate volume   Culture   Final    NO GROWTH 2 DAYS Performed at [facility], [address] [place], [place] [address]    Report Status PENDING  Incomplete         Radiology Studies: Dg Swallowing Func-speech Pathology  Result Date: 05/05/2017 Objective Swallowing Evaluation: Type of Study: MBS-Modified Barium Swallow Study  Patient Details Name: [name] MRN: [id number] Date of  Birth: 10/03/1928 Today's Date: 05/05/2017 Time: SLP Start Time (ACUTE ONLY): 0855 -SLP Stop Time (ACUTE ONLY): 0920 SLP Time Calculation (min) (ACUTE ONLY): 25 min Past Medical History: Past Medical History: Diagnosis Date . Anemia  . Atrial fibrillation ([place])  . Back pain  . Bacterial pneumonia  . Cancer (HCC)   Skin  . Chronic respiratory failure ([place])  . Cognitive communication deficit  . COPD (chronic obstructive pulmonary disease) ([place])  . Dementia  . Dementia  . Depression  . Diabetes mellitus without complication ([place])  . Diverticulosis  . Diverticulosis  . Fatty liver  . Fibromyalgia  . Fibromyalgia  . Gastric polyp 09/08/11  at the anastomosis-inflammatory . Generalized anxiety disorder  . Generalized anxiety disorder  . GERD (gastroesophageal reflux disease)  . GERD (gastroesophageal reflux disease)  . 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)  . Hypercholesteremia  . Hypertension  . Insomnia  . Internal hemorrhoids  . Muscle weakness  . Neuropathy  . On home O2   qhs . Osteoporosis  . Rheumatoid arteritis  . Ulcer   stomach . Vitamin D deficiency  Past Surgical History: Past Surgical History: Procedure Laterality Date . ABDOMINAL HYSTERECTOMY   . ABDOMINAL SURGERY    removed patial stomach from ulcer . COLONOSCOPY  04-2001  internal hemorrhoids, panocolonic diverticulosis, and colon polyps  . UPPER GASTROINTESTINAL ENDOSCOPY   HPI: 84 y.o. female with medical history significant of  rheumatoid arthritis, chronic respiratory failure, COPD, chronic kidney disease stage III who was recently discharged from the hospital after being treated for healthcare associated pneumonia and sent to nursing home.  Patient had apparently initially been doing well.  She developed a significant leukocytosis and chest x-ray was repeated.  There was concern for persistent pneumonia and she was started on a course of Augmentin.  Her course at the nursing home waxed and waned.  More recently, she became increasingly short of breath and was noted to be hypoxic.  She became somnolent and lethargic.  She also had vomiting and diarrhea which occurred in the last 24 hours.  On arrival to the emergency room she was noted to have a mild fever at 100.7.  Chest x-ray indicated persistent pneumonia.  Urinalysis did not show any signs of infection.  Influenza test was negative.  Subjective: "Will I have to do this every morning before I can have breakfast?" Assessment / Plan / Recommendation CHL IP CLINICAL IMPRESSIONS 05/05/2017 Clinical Impression Pt presents with mild oral phase dysphagia likely due to dentures that are loose fitting which results in min oral residue of thins beneath the dentures post swallow. Swallow trigger is at the level of the valleculae, mildly reduced epiglottic deflection with only one episode of trace, flash penetration of thin liquids when Pt taking barium tablet with thins. No aspiration or significant pharyngeal residuals noted. Pt with mild lingual coating/residue, however initiates a spontaneous secondary swallow which clears. Recommend D3/mech soft with thin liquids with po medications whole with liquids or in puree and presented one at a time. Pt will need to rinse her dentures following po due to some retention of liquids noted post swallow. No further SLP services indicated at this time.  SLP Visit Diagnosis Dysphagia, oropharyngeal phase (R13.12) Attention and concentration deficit following  -- Frontal lobe and executive function deficit following -- Impact on safety and function Mild aspiration risk   CHL IP TREATMENT RECOMMENDATION 05/05/2017 Treatment Recommendations No treatment recommended at this time   Prognosis 05/05/2017 Prognosis for Safe Diet Advancement Good Barriers to Reach  Goals -- Barriers/Prognosis Comment -- CHL IP DIET RECOMMENDATION 05/05/2017 SLP Diet Recommendations Thin liquid;Dysphagia 3 (Mech soft) solids Liquid Administration via Cup;Straw Medication Administration Whole meds with liquid Compensations Slow rate;Small sips/bites;Multiple dry swallows after each bite/sip Postural Changes Remain semi-upright after after feeds/meals (Comment);Seated upright at 90 degrees   CHL IP OTHER RECOMMENDATIONS 05/05/2017 Recommended Consults -- Oral Care Recommendations Oral care BID;Staff/trained caregiver to provide oral care Other Recommendations Clarify dietary restrictions   CHL IP FOLLOW UP RECOMMENDATIONS 05/05/2017 Follow up Recommendations 24 hour supervision/assistance   CHL IP FREQUENCY AND DURATION 05/04/2017 Speech Therapy Frequency (ACUTE ONLY) min 2x/week Treatment Duration 1 week      CHL IP ORAL PHASE 05/05/2017 Oral Phase Impaired Oral - Pudding Teaspoon -- Oral - Pudding Cup -- Oral - Honey Teaspoon -- Oral - Honey Cup -- Oral - Nectar Teaspoon -- Oral - Nectar Cup -- Oral - Nectar Straw -- Oral - Thin Teaspoon Lingual/palatal residue Oral - Thin Cup Lingual/palatal residue Oral - Thin Straw Lingual/palatal residue Oral - Puree -- Oral - Mech Soft -- Oral - Regular Piecemeal swallowing;Delayed oral transit Oral - Multi-Consistency -- Oral - Pill -- Oral Phase - Comment min oral residuals after liquids likely due to retention beneath and around dentures  CHL IP PHARYNGEAL PHASE 05/05/2017 Pharyngeal Phase WFL;Impaired Pharyngeal- Pudding Teaspoon -- Pharyngeal -- Pharyngeal- Pudding Cup -- Pharyngeal -- Pharyngeal- Honey Teaspoon -- Pharyngeal -- Pharyngeal- Honey Cup --  Pharyngeal -- Pharyngeal- Nectar Teaspoon -- Pharyngeal -- Pharyngeal- Nectar Cup -- Pharyngeal -- Pharyngeal- Nectar Straw -- Pharyngeal -- Pharyngeal- Thin Teaspoon -- Pharyngeal -- Pharyngeal- Thin Cup Delayed swallow initiation-vallecula;Reduced epiglottic inversion Pharyngeal -- Pharyngeal- Thin Straw -- Pharyngeal -- Pharyngeal- Puree -- Pharyngeal -- Pharyngeal- Mechanical Soft -- Pharyngeal -- Pharyngeal- Regular -- Pharyngeal -- Pharyngeal- Multi-consistency -- Pharyngeal -- Pharyngeal- Pill Delayed swallow initiation-vallecula;Penetration/Aspiration during swallow Pharyngeal Material enters airway, remains ABOVE vocal cords then ejected out Pharyngeal Comment Essentially WNL for age  CHL IP CERVICAL ESOPHAGEAL PHASE 05/05/2017 Cervical Esophageal Phase WFL Pudding Teaspoon -- Pudding Cup -- Honey Teaspoon -- Honey Cup -- Nectar Teaspoon -- Nectar Cup -- Nectar Straw -- Thin Teaspoon -- Thin Cup -- Thin Straw -- Puree -- Mechanical Soft -- Regular -- Multi-consistency -- Pill -- Cervical Esophageal Comment -- No flowsheet data found. Thank you, Genene Churn, [place] 05/05/2017, 1:27 PM                   Scheduled Meds: . apixaban  2.5 mg Oral BID  . DULoxetine  60 mg Oral Daily  . guaiFENesin  600 mg Oral BID  . insulin aspart  0-15 Units Subcutaneous TID WC  . insulin aspart  0-5 Units Subcutaneous QHS  . ipratropium-albuterol  3 mL Nebulization Q6H  . LORazepam  0.25 mg Oral BID  . mouth rinse  15 mL Mouth Rinse BID  . pantoprazole  40 mg Oral Daily  . potassium chloride SA  40 mEq Oral Once  . predniSONE  40 mg Oral Q breakfast  . pregabalin  100 mg Oral TID   Continuous Infusions: . piperacillin-tazobactam (ZOSYN)  IV 3.375 g (05/05/17 1800)  . vancomycin Stopped (05/05/17 1200)     LOS: 2 days    Time spent: 55mins    Kathie Dike, MD [facility] Pager [phone]  If 7PM-7AM, please contact night-coverage [email or web address] Password  [facility] 05/05/2017, 6:33 PM

## 2017-05-05 NOTE — Progress Notes (Signed)
Inpatient Diabetes Program Recommendations  AACE/ADA: New Consensus Statement on Inpatient Glycemic Control (2015)  Target Ranges:  Prepandial:   less than 140 mg/dL      Peak postprandial:   less than 180 mg/dL (1-2 hours)      Critically ill patients:  140 - 180 mg/dL   Results for ALEXZANDRIA, MASSMAN (MRN [id number]) as of 05/05/2017 12:42  Ref. Range 05/04/2017 07:54 05/04/2017 11:19 05/04/2017 17:06 05/04/2017 21:19  Glucose-Capillary Latest Ref Range: 65 - 99 mg/dL 293 (H)  8 units Novolog 354 (H)  15  units Novolog 280 (H)   8  units Novolog 334 (H)  4  units Novolog   Results for EUSTACIA, URBANEK (MRN [id number]) as of 05/05/2017 12:42  Ref. Range 05/05/2017 08:01 05/05/2017 11:34  Glucose-Capillary Latest Ref Range: 65 - 99 mg/dL 250 (H)  5  units Novolog 318 (H)    Home DM Meds: Tradjenta 5 mg daily  Current Insulin Orders: Novolog Moderate Correction Scale/ SSI (0-15 units) TID AC + HS      Note last dose Solumedrol given yesterday at 10pm.   Now getting Prednisone 40 mg daily.    MD- If CBGs persist >250 mg/dl despite reduction of steroids, please consider the following:  Start Lantus 10 units daily (0.1 units/kg dosing based on weight of 92 kg)    --Will follow patient during hospitalization--  Wyn Quaker RN, MSN, CDE Diabetes Coordinator Inpatient Glycemic Control Team Team Pager: [phone] (8a-5p)

## 2017-05-05 NOTE — Progress Notes (Signed)
Modified Barium Swallow Progress Note  Patient Details  Name: [name] MRN: [id number] Date of Birth: 08/25/1928  Today's Date: 05/05/2017  Modified Barium Swallow completed.  Full report located under Chart Review in the Imaging Section.  Brief recommendations include the following:  Clinical Impression  Pt presents with mild oral phase dysphagia likely due to dentures that are loose fitting which results in min oral residue of thins beneath the dentures post swallow. Swallow trigger is at the level of the valleculae, mildly reduced epiglottic deflection with only one episode of trace, flash penetration of thin liquids when Pt taking barium tablet with thins. No aspiration or significant pharyngeal residuals noted. Pt with mild lingual coating/residue, however initiates a spontaneous secondary swallow which clears. Recommend D3/mech soft with thin liquids with po medications whole with liquids or in puree and presented one at a time. Pt will need to rinse her dentures following po due to some retention of liquids noted post swallow. No further SLP services indicated at this time.    Swallow Evaluation Recommendations       SLP Diet Recommendations: Thin liquid;Dysphagia 3 (Mech soft) solids   Liquid Administration via: Cup;Straw   Medication Administration: Whole meds with liquid   Supervision: Patient able to self feed   Compensations: Slow rate;Small sips/bites;Multiple dry swallows after each bite/sip(rinse dentures after meals)   Postural Changes: Remain semi-upright after after feeds/meals (Comment);Seated upright at 90 degrees   Oral Care Recommendations: Oral care BID;Staff/trained caregiver to provide oral care   Other Recommendations: Clarify dietary restrictions   Thank you,  Genene Churn, [place]  [place] 05/05/2017,1:22 PM

## 2017-05-06 ENCOUNTER — Inpatient Hospital Stay
Admission: RE | Admit: 2017-05-06 | Discharge: 2021-01-28 | Disposition: A | Discharge status: Skilled Nursing Facility | Payer: Medicare Other | Source: Ambulatory Visit | Attending: Internal Medicine | Admitting: Internal Medicine

## 2017-05-06 DIAGNOSIS — A419 Sepsis, unspecified organism: Secondary | ICD-10-CM | POA: Diagnosis not present

## 2017-05-06 DIAGNOSIS — N179 Acute kidney failure, unspecified: Secondary | ICD-10-CM | POA: Diagnosis not present

## 2017-05-06 DIAGNOSIS — M6281 Muscle weakness (generalized): Secondary | ICD-10-CM | POA: Diagnosis not present

## 2017-05-06 DIAGNOSIS — Z9181 History of falling: Secondary | ICD-10-CM | POA: Diagnosis not present

## 2017-05-06 DIAGNOSIS — R278 Other lack of coordination: Secondary | ICD-10-CM | POA: Diagnosis not present

## 2017-05-06 DIAGNOSIS — E118 Type 2 diabetes mellitus with unspecified complications: Secondary | ICD-10-CM | POA: Diagnosis not present

## 2017-05-06 DIAGNOSIS — Z7901 Long term (current) use of anticoagulants: Secondary | ICD-10-CM | POA: Diagnosis not present

## 2017-05-06 DIAGNOSIS — F411 Generalized anxiety disorder: Secondary | ICD-10-CM | POA: Diagnosis not present

## 2017-05-06 DIAGNOSIS — J189 Pneumonia, unspecified organism: Principal | ICD-10-CM

## 2017-05-06 DIAGNOSIS — F0390 Unspecified dementia without behavioral disturbance: Secondary | ICD-10-CM | POA: Diagnosis not present

## 2017-05-06 DIAGNOSIS — R062 Wheezing: Secondary | ICD-10-CM

## 2017-05-06 DIAGNOSIS — I10 Essential (primary) hypertension: Secondary | ICD-10-CM | POA: Diagnosis not present

## 2017-05-06 DIAGNOSIS — K219 Gastro-esophageal reflux disease without esophagitis: Secondary | ICD-10-CM | POA: Diagnosis not present

## 2017-05-06 DIAGNOSIS — M069 Rheumatoid arthritis, unspecified: Secondary | ICD-10-CM | POA: Diagnosis not present

## 2017-05-06 DIAGNOSIS — I482 Chronic atrial fibrillation: Secondary | ICD-10-CM | POA: Diagnosis not present

## 2017-05-06 DIAGNOSIS — R41841 Cognitive communication deficit: Secondary | ICD-10-CM | POA: Diagnosis not present

## 2017-05-06 DIAGNOSIS — R52 Pain, unspecified: Secondary | ICD-10-CM

## 2017-05-06 DIAGNOSIS — J90 Pleural effusion, not elsewhere classified: Secondary | ICD-10-CM

## 2017-05-06 DIAGNOSIS — J449 Chronic obstructive pulmonary disease, unspecified: Secondary | ICD-10-CM | POA: Diagnosis not present

## 2017-05-06 DIAGNOSIS — J9621 Acute and chronic respiratory failure with hypoxia: Secondary | ICD-10-CM | POA: Diagnosis not present

## 2017-05-06 DIAGNOSIS — S32010D Wedge compression fracture of first lumbar vertebra, subsequent encounter for fracture with routine healing: Secondary | ICD-10-CM | POA: Diagnosis not present

## 2017-05-06 DIAGNOSIS — S0990XA Unspecified injury of head, initial encounter: Secondary | ICD-10-CM

## 2017-05-06 DIAGNOSIS — G9340 Encephalopathy, unspecified: Secondary | ICD-10-CM | POA: Diagnosis not present

## 2017-05-06 DIAGNOSIS — J9611 Chronic respiratory failure with hypoxia: Secondary | ICD-10-CM | POA: Diagnosis not present

## 2017-05-06 DIAGNOSIS — R262 Difficulty in walking, not elsewhere classified: Secondary | ICD-10-CM | POA: Diagnosis not present

## 2017-05-06 DIAGNOSIS — E1140 Type 2 diabetes mellitus with diabetic neuropathy, unspecified: Secondary | ICD-10-CM | POA: Diagnosis not present

## 2017-05-06 DIAGNOSIS — N183 Chronic kidney disease, stage 3 (moderate): Secondary | ICD-10-CM | POA: Diagnosis not present

## 2017-05-06 DIAGNOSIS — M797 Fibromyalgia: Secondary | ICD-10-CM | POA: Diagnosis not present

## 2017-05-06 DIAGNOSIS — J441 Chronic obstructive pulmonary disease with (acute) exacerbation: Secondary | ICD-10-CM | POA: Diagnosis not present

## 2017-05-06 LAB — CULTURE, BLOOD (ROUTINE X 2): Special Requests: ADEQUATE

## 2017-05-06 LAB — LEGIONELLA PNEUMOPHILA SEROGP 1 UR AG: L. pneumophila Serogp 1 Ur Ag: NEGATIVE

## 2017-05-06 LAB — GLUCOSE, CAPILLARY
GLUCOSE-CAPILLARY: 208 mg/dL — AB (ref 65–99)
Glucose-Capillary: 157 mg/dL — ABNORMAL HIGH (ref 65–99)

## 2017-05-06 MED ORDER — PREDNISONE 20 MG PO TABS: ORAL_TABLET | ORAL | Status: DC

## 2017-05-06 MED ORDER — AMOXICILLIN-POT CLAVULANATE 875-125 MG PO TABS: 1.0000 | ORAL_TABLET | Freq: Two times a day (BID) | ORAL | 0 refills | Status: AC

## 2017-05-06 MED ORDER — FUROSEMIDE 20 MG PO TABS: 20.0000 mg | ORAL_TABLET | Freq: Every day | ORAL | Status: DC | PRN

## 2017-05-06 NOTE — Discharge Summary (Signed)
Physician Discharge Summary  [name]:[id number] DOB: 1928/04/20 DOA: 05/03/2017  PCP: Celene Squibb, MD  Admit date: 05/03/2017 Discharge date: 05/06/2017  Admitted From: Skilled nursing facility Disposition: Skilled nursing facility  Recommendations for Outpatient Follow-up:  1. Follow up with PCP in 1-2 weeks 2. Please obtain BMP/CBC in one week 3. Repeat chest x-ray in 4-6 weeks to ensure resolution of pneumonia  Discharge Condition: Stable CODE STATUS: DNR Diet recommendation: Dysphagia 3 diet with thin liquids, carb modified  Brief/Interim Summary: 84 year old female with a history of oxygen dependent COPD, chronic kidney disease stage III, presents to the hospital with recurrent pneumonia.  Suspect aspiration pneumonia.  On intravenous antibiotics.  She was also noted to have acute kidney injury on chronic kidney disease related to dehydration.  Started on IV fluids.  She has 1 out of 2 positive blood cultures for gram-positive cocci.  Continue to follow further identifications.  Clinically, she is improving.  Speech therapy following to evaluate for aspiration  Discharge Diagnoses:  Active Problems:   Fibromyalgia   COPD (chronic obstructive pulmonary disease) (HCC)   GERD (gastroesophageal reflux disease)   Hypokalemia   Acute encephalopathy   AKI (acute kidney injury) ([place])   Acute gastroenteritis   Peripheral neuropathy   Dementia   Acute on chronic respiratory failure with hypoxia (HCC)   CKD (chronic kidney disease), stage III ([place])   HCAP (healthcare-associated pneumonia)   Diabetes type 2, controlled ([place])   Atrial fibrillation, chronic ([place])   Rheumatoid arthritis ([place])   Sepsis ([place])  1. Acute on chronic respiratory failure with hypoxia.  Related to pneumonia and COPD.  Chronically on 2-3 L.  Initially requiring 7 L high flow nasal cannula.  She has since been weaned down to 3.5 L.   2. Sepsis.  Related to pneumonia.  Blood pressures have improved  with IV hydration.  Lactic acid has normalized.  Blood cultures showed 1 out of 2 sets positive for gram-positive cocci.  This resulted as coagulase-negative staph and is likely a contaminant. 3. Pneumonia, suspect aspiration.  Patient did have coughing/wheezing after eating and drink continue on vancomycin and Zosyn.  Speech therapy following evaluated the patient and she underwent modified barium study.  Recommendations are for dysphagia 3 diet with thin liquids.  Since she is clinically improved, we will transition her to a course of Augmentin. 4. COPD.    Treated with bronchodilators and steroids.  Wheezing has resolved.  She is been placed on a prednisone taper and continued on nebulizer treatments 5. Acute kidney injury on chronic kidney disease stage III.    Prerenal, related to dehydration.  Creatinine has improved with hydration.  Urinalysis is unrevealing.   6. Vomiting and diarrhea.  Possibly related to a viral illness.  She has not had any significant diarrhea since admission.   7. Chronic atrial fibrillation.  Anticoagulation with Eliquis.  Blood pressures have stabilized.  Resume on diltiazem 8. Diabetes.    Treated with sliding scale insulin.  Blood sugars elevated due to steroids.    Since yesterday and they are improving.  Resume oral hypoglycemics on discharge. 9. Rheumatoid arthritis.  Chronically on methotrexate.    Resume on discharge 10. Peripheral neuropathy.  Related to diabetes.  Continue on Lyrica 11. Dementia.  Has some confusion but otherwise does not have agitation at this time.  Continue to monitor. 12. Hypokalemia.  Improved with replacement.  She is chronically on low-dose Lasix.  Since her p.o. intake has been unpredictable, and  she is prone to dehydration, will change Lasix to as needed for edema/fluid.     Discharge Instructions  Discharge Instructions    Diet - low sodium heart healthy   Complete by:  As directed    Increase activity slowly   Complete by:  As  directed      Allergies as of 05/06/2017      Reactions   Other    Band Aids    Tape    Lipitor [atorvastatin Calcium] Rash, Other (See Comments)   Muscle pain   Niaspan [niacin Er] Rash, Other (See Comments)   Muscle pain      Medication List    STOP taking these medications   potassium chloride SA 20 MEQ tablet Commonly known as:  K-DUR,KLOR-CON     TAKE these medications   acetaminophen 325 MG tablet Commonly known as:  TYLENOL Take 650 mg by mouth every 8 (eight) hours as needed.   amoxicillin-clavulanate 875-125 MG tablet Commonly known as:  AUGMENTIN Take 1 tablet by mouth 2 (two) times daily for 10 days.   CARTIA XT 300 MG 24 hr capsule Generic drug:  diltiazem TAKE ONE CAPSULE BY MOUTH EVERY DAY   DAILY VITE PO Take 1 tablet by mouth daily.   donepezil 5 MG tablet Commonly known as:  ARICEPT Take 5 mg by mouth at bedtime.   DULoxetine 60 MG capsule Commonly known as:  CYMBALTA Take 60 mg by mouth daily.   ELIQUIS 2.5 MG Tabs tablet Generic drug:  apixaban Take 2.5 mg by mouth 2 (two) times daily.   furosemide 20 MG tablet Commonly known as:  LASIX Take 1 tablet (20 mg total) by mouth daily as needed for edema. What changed:    when to take this  reasons to take this   guaifenesin 100 MG/5ML syrup Commonly known as:  ROBITUSSIN Take 200 mg by mouth 4 (four) times daily as needed for cough.   HYDROcodone-acetaminophen 5-325 MG tablet Commonly known as:  NORCO/VICODIN Take 1 tablet by mouth every 8 (eight) hours as needed for moderate pain. Starting 04/24/2017   ipratropium-albuterol 0.5-2.5 (3) MG/3ML Soln Commonly known as:  DUONEB Take 3 mLs by nebulization every 6 (six) hours.   linagliptin 5 MG Tabs tablet Commonly known as:  TRADJENTA Take 1 tablet (5 mg total) by mouth daily.   LORazepam 0.5 MG tablet Commonly known as:  ATIVAN Take 0.5 tablets (0.25 mg total) by mouth 2 (two) times daily. Take for anxiety 14 days and to be  reassessed  By MD.   Recardo Evangelist 100 MG capsule Generic drug:  pregabalin Take 100 mg by mouth 3 (three) times daily.   methotrexate 2.5 MG tablet Take 12.5 mg by mouth. On Thursday at noon.   pantoprazole 40 MG tablet Commonly known as:  PROTONIX Take 1 tablet (40 mg total) by mouth daily.   phenylephrine-shark liver oil-mineral oil-petrolatum 0.25-3-14-71.9 % rectal ointment Commonly known as:  PREPARATION H Place 1 application rectally 2 (two) times daily as needed for hemorrhoids.   polyethylene glycol packet Commonly known as:  MIRALAX / GLYCOLAX Take 17 g by mouth daily as needed for moderate constipation.   predniSONE 20 MG tablet Commonly known as:  DELTASONE Take 40mg  po daily for 2 days then 30mg  daily for 2 days then 20mg  daily for 2 days then 10mg  daily for 2 days then stop   PROAIR HFA 108 (90 Base) MCG/ACT inhaler Generic drug:  albuterol Inhale 2 puffs into the lungs  every 6 (six) hours as needed for wheezing or shortness of breath. For rescue   raloxifene 60 MG tablet Commonly known as:  EVISTA Take 60 mg by mouth daily.   rosuvastatin 20 MG tablet Commonly known as:  CRESTOR Take 20 mg by mouth daily.   VENELEX Oint Apply to sacrum and bilateral buttocks q shift and prn for prevention       Allergies  Allergen Reactions  . Other     Band Aids   . Tape   . Lipitor [Atorvastatin Calcium] Rash and Other (See Comments)    Muscle pain  . Niaspan [Niacin Er] Rash and Other (See Comments)    Muscle pain    Consultations:  Speech therapy   Procedures/Studies: Dg Chest 1 View  Result Date: 04/10/2017 CLINICAL DATA:  Patient fell out of bed.  Low back pain. EXAM: CHEST 1 VIEW COMPARISON:  03/31/2017 FINDINGS: Mild cardiac enlargement. No vascular congestion or edema. Interstitial fibrosis in the lungs. There is interval development of superimposed airspace disease in the right mid lung likely representing developing pneumonia. Aspiration or contusion  could also potentially have this appearance. Calcified and tortuous aorta. Degenerative changes in the shoulders. IMPRESSION: Cardiac enlargement. Interstitial fibrosis in the lungs. Developing airspace disease in the right mid lung likely represent pneumonia. Aspiration or contusion could also have this appearance. Electronically Signed   By: Lucienne Capers M.D.   On: 04/10/2017 06:41   Dg Lumbar Spine Complete  Result Date: 04/10/2017 CLINICAL DATA:  Patient fell out of bed.  Low back pain. EXAM: LUMBAR SPINE - COMPLETE 4+ VIEW COMPARISON:  10/25/2016 FINDINGS: Mild lumbar scoliosis convex towards the right. Degenerative changes throughout the lumbar spine with narrowed interspaces and endplate hypertrophic changes. Interval anterior compression of L1 since previous study. Given the history of trauma, this could represent acute compression fracture. No retropulsion of fracture fragments. Degenerative disc disease in the lower lumbar spine. Mild compression of L4 is unchanged. Vascular calcifications throughout the aorta. IMPRESSION: Anterior compression of L1, new since previous study and likely acute. This represents approximately 25% loss of height. Diffuse degenerative change throughout the lumbar spine. Aortic atherosclerosis. Electronically Signed   By: Lucienne Capers M.D.   On: 04/10/2017 06:43   Dg Pelvis 1-2 Views  Result Date: 04/10/2017 CLINICAL DATA:  Patient fell out of bed.  Low back pain. EXAM: PELVIS - 1-2 VIEW COMPARISON:  CT abdomen and pelvis 07/26/2016. FINDINGS: Degenerative changes in the lower lumbar spine and hips. No evidence of acute fracture or dislocation in the pelvis or hips. SI joints and symphysis pubis are not displaced. No focal bone lesion or bone destruction. IMPRESSION: Degenerative changes in the lower lumbar spine and both hips. No acute bony abnormalities. Electronically Signed   By: Lucienne Capers M.D.   On: 04/10/2017 06:40   Ct Head Wo Contrast  Result  Date: 04/10/2017 CLINICAL DATA:  84 year old female fell out of bed. Complaining of lower mid back pain. Does not recall hitting head. Headaches and bilateral neck stiffness. Initial encounter. EXAM: CT HEAD WITHOUT CONTRAST CT CERVICAL SPINE WITHOUT CONTRAST TECHNIQUE: Multidetector CT imaging of the head and cervical spine was performed following the standard protocol without intravenous contrast. Multiplanar CT image reconstructions of the cervical spine were also generated. COMPARISON:  07/16/2016 CT head and cervical spine. FINDINGS: CT HEAD FINDINGS Brain: No intracranial hemorrhage or CT evidence of large acute infarct. Chronic microvascular changes. Focal fat anterior falx otherwise, no intracranial mass lesion noted on this  unenhanced exam. Vascular: Ectatic basilar artery.  Calcified carotid arteries. Skull: No skull fracture Sinuses/Orbits: Chronic nonspecific soft tissue swelling inferior medial aspect of the left globe. Partial opacification left frontal sinus. Other: Mastoid air cells and middle ear cavities are clear. CT CERVICAL SPINE FINDINGS Alignment: Minimal curvature convex left. Minimal anterior slip C3 and C4 and minimal retrolisthesis C5 unchanged from prior exam. Skull base and vertebrae: Evaluation slightly limited by artifact from patient's shoulders/habitus. No acute cervical spine fracture noted. Soft tissues and spinal canal: No abnormal prevertebral soft tissue swelling. Disc levels:  Evaluation limited by habitus. Upper chest: Bilateral upper lobe airspace disease greater on right. Please see chest x-ray report from same date. Other: Left thyroid 1.3 centimeter nodule and right thyroid 6 millimeter nodule incidentally noted. Carotid bifurcation calcifications. IMPRESSION: No skull fracture or intracranial hemorrhage. No cervical spine fracture or abnormal prevertebral soft tissue swelling. Alignment similar to prior exam. Evaluation slightly limited by patient's habitus/shoulder  artifact. Bilateral upper lobe airspace disease greater on right. Please see chest x-ray report from same date. Electronically Signed   By: Genia Del M.D.   On: 04/10/2017 07:10   Ct Cervical Spine Wo Contrast  Result Date: 04/10/2017 CLINICAL DATA:  84 year old female fell out of bed. Complaining of lower mid back pain. Does not recall hitting head. Headaches and bilateral neck stiffness. Initial encounter. EXAM: CT HEAD WITHOUT CONTRAST CT CERVICAL SPINE WITHOUT CONTRAST TECHNIQUE: Multidetector CT imaging of the head and cervical spine was performed following the standard protocol without intravenous contrast. Multiplanar CT image reconstructions of the cervical spine were also generated. COMPARISON:  07/16/2016 CT head and cervical spine. FINDINGS: CT HEAD FINDINGS Brain: No intracranial hemorrhage or CT evidence of large acute infarct. Chronic microvascular changes. Focal fat anterior falx otherwise, no intracranial mass lesion noted on this unenhanced exam. Vascular: Ectatic basilar artery.  Calcified carotid arteries. Skull: No skull fracture Sinuses/Orbits: Chronic nonspecific soft tissue swelling inferior medial aspect of the left globe. Partial opacification left frontal sinus. Other: Mastoid air cells and middle ear cavities are clear. CT CERVICAL SPINE FINDINGS Alignment: Minimal curvature convex left. Minimal anterior slip C3 and C4 and minimal retrolisthesis C5 unchanged from prior exam. Skull base and vertebrae: Evaluation slightly limited by artifact from patient's shoulders/habitus. No acute cervical spine fracture noted. Soft tissues and spinal canal: No abnormal prevertebral soft tissue swelling. Disc levels:  Evaluation limited by habitus. Upper chest: Bilateral upper lobe airspace disease greater on right. Please see chest x-ray report from same date. Other: Left thyroid 1.3 centimeter nodule and right thyroid 6 millimeter nodule incidentally noted. Carotid bifurcation calcifications.  IMPRESSION: No skull fracture or intracranial hemorrhage. No cervical spine fracture or abnormal prevertebral soft tissue swelling. Alignment similar to prior exam. Evaluation slightly limited by patient's habitus/shoulder artifact. Bilateral upper lobe airspace disease greater on right. Please see chest x-ray report from same date. Electronically Signed   By: Genia Del M.D.   On: 04/10/2017 07:10   Dg Chest Portable 1 View  Result Date: 05/03/2017 CLINICAL DATA:  Cough EXAM: PORTABLE CHEST 1 VIEW COMPARISON:  04/10/2017 FINDINGS: There is persistent right upper lobe airspace disease most concerning for pneumonia. There is diffuse bilateral interstitial thickening. There is no significant pleural effusion. There is no pneumothorax. There is stable cardiomegaly. The osseous structures are unremarkable. IMPRESSION: 1. Persistent right upper lobe pneumonia. 2. Bilateral mild interstitial thickening primarily at the lung bases which may reflect multi lobar pneumonia versus mild CHF. Electronically Signed   By:  Kathreen Devoid   On: 05/03/2017 09:16   Dg Swallowing Func-speech Pathology  Result Date: 05/05/2017 Objective Swallowing Evaluation: Type of Study: MBS-Modified Barium Swallow Study  Patient Details Name: [name] MRN: [id number] Date of Birth: [birth year] Today's Date: 05/05/2017 Time: SLP Start Time (ACUTE ONLY): 0855 -SLP Stop Time (ACUTE ONLY): 0920 SLP Time Calculation (min) (ACUTE ONLY): 25 min Past Medical History: Past Medical History: Diagnosis Date . Anemia  . Atrial fibrillation ([place])  . Back pain  . Bacterial pneumonia  . Cancer (HCC)   Skin  . Chronic respiratory failure ([place])  . Cognitive communication deficit  . COPD (chronic obstructive pulmonary disease) ([place])  . Dementia  . Dementia  . Depression  . Diabetes mellitus without complication ([place])  . Diverticulosis  . Diverticulosis  . Fatty liver  . Fibromyalgia  . Fibromyalgia  . Gastric polyp 09/08/11  at the  anastomosis-inflammatory . Generalized anxiety disorder  . Generalized anxiety disorder  . GERD (gastroesophageal reflux disease)  . GERD (gastroesophageal reflux disease)  . 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)  . Hypercholesteremia  . Hypertension  . Insomnia  . Internal hemorrhoids  . Muscle weakness  . Neuropathy  . On home O2   qhs . Osteoporosis  . Rheumatoid arteritis  . Ulcer   stomach . Vitamin D deficiency  Past Surgical History: Past Surgical History: Procedure Laterality Date . ABDOMINAL HYSTERECTOMY   . ABDOMINAL SURGERY    removed patial stomach from ulcer . COLONOSCOPY  04-2001  internal hemorrhoids, panocolonic diverticulosis, and colon polyps  . UPPER GASTROINTESTINAL ENDOSCOPY   HPI: 84 y.o. female with medical history significant of rheumatoid arthritis, chronic respiratory failure, COPD, chronic kidney disease stage III who was recently discharged from the hospital after being treated for healthcare associated pneumonia and sent to nursing home.  Patient had apparently initially been doing well.  She developed a significant leukocytosis and chest x-ray was repeated.  There was concern for persistent pneumonia and she was started on a course of Augmentin.  Her course at the nursing home waxed and waned.  More recently, she became increasingly short of breath and was noted to be hypoxic.  She became somnolent and lethargic.  She also had vomiting and diarrhea which occurred in the last 24 hours.  On arrival to the emergency room she was noted to have a mild fever at 100.7.  Chest x-ray indicated persistent pneumonia.  Urinalysis did not show any signs of infection.  Influenza test was negative.  Subjective: "Will I have to do this every morning before I can have breakfast?" Assessment / Plan / Recommendation CHL IP CLINICAL IMPRESSIONS 05/05/2017 Clinical Impression Pt presents with mild oral phase dysphagia likely due to dentures that are loose fitting which results in min oral residue of thins  beneath the dentures post swallow. Swallow trigger is at the level of the valleculae, mildly reduced epiglottic deflection with only one episode of trace, flash penetration of thin liquids when Pt taking barium tablet with thins. No aspiration or significant pharyngeal residuals noted. Pt with mild lingual coating/residue, however initiates a spontaneous secondary swallow which clears. Recommend D3/mech soft with thin liquids with po medications whole with liquids or in puree and presented one at a time. Pt will need to rinse her dentures following po due to some retention of liquids noted post swallow. No further SLP services indicated at this time.  SLP Visit Diagnosis Dysphagia, oropharyngeal phase (R13.12) Attention and concentration deficit following -- Frontal lobe and executive function  deficit following -- Impact on safety and function Mild aspiration risk   CHL IP TREATMENT RECOMMENDATION 05/05/2017 Treatment Recommendations No treatment recommended at this time   Prognosis 05/05/2017 Prognosis for Safe Diet Advancement Good Barriers to Reach Goals -- Barriers/Prognosis Comment -- CHL IP DIET RECOMMENDATION 05/05/2017 SLP Diet Recommendations Thin liquid;Dysphagia 3 (Mech soft) solids Liquid Administration via Cup;Straw Medication Administration Whole meds with liquid Compensations Slow rate;Small sips/bites;Multiple dry swallows after each bite/sip Postural Changes Remain semi-upright after after feeds/meals (Comment);Seated upright at 90 degrees   CHL IP OTHER RECOMMENDATIONS 05/05/2017 Recommended Consults -- Oral Care Recommendations Oral care BID;Staff/trained caregiver to provide oral care Other Recommendations Clarify dietary restrictions   CHL IP FOLLOW UP RECOMMENDATIONS 05/05/2017 Follow up Recommendations 24 hour supervision/assistance   CHL IP FREQUENCY AND DURATION 05/04/2017 Speech Therapy Frequency (ACUTE ONLY) min 2x/week Treatment Duration 1 week      CHL IP ORAL PHASE 05/05/2017 Oral Phase  Impaired Oral - Pudding Teaspoon -- Oral - Pudding Cup -- Oral - Honey Teaspoon -- Oral - Honey Cup -- Oral - Nectar Teaspoon -- Oral - Nectar Cup -- Oral - Nectar Straw -- Oral - Thin Teaspoon Lingual/palatal residue Oral - Thin Cup Lingual/palatal residue Oral - Thin Straw Lingual/palatal residue Oral - Puree -- Oral - Mech Soft -- Oral - Regular Piecemeal swallowing;Delayed oral transit Oral - Multi-Consistency -- Oral - Pill -- Oral Phase - Comment min oral residuals after liquids likely due to retention beneath and around dentures  CHL IP PHARYNGEAL PHASE 05/05/2017 Pharyngeal Phase WFL;Impaired Pharyngeal- Pudding Teaspoon -- Pharyngeal -- Pharyngeal- Pudding Cup -- Pharyngeal -- Pharyngeal- Honey Teaspoon -- Pharyngeal -- Pharyngeal- Honey Cup -- Pharyngeal -- Pharyngeal- Nectar Teaspoon -- Pharyngeal -- Pharyngeal- Nectar Cup -- Pharyngeal -- Pharyngeal- Nectar Straw -- Pharyngeal -- Pharyngeal- Thin Teaspoon -- Pharyngeal -- Pharyngeal- Thin Cup Delayed swallow initiation-vallecula;Reduced epiglottic inversion Pharyngeal -- Pharyngeal- Thin Straw -- Pharyngeal -- Pharyngeal- Puree -- Pharyngeal -- Pharyngeal- Mechanical Soft -- Pharyngeal -- Pharyngeal- Regular -- Pharyngeal -- Pharyngeal- Multi-consistency -- Pharyngeal -- Pharyngeal- Pill Delayed swallow initiation-vallecula;Penetration/Aspiration during swallow Pharyngeal Material enters airway, remains ABOVE vocal cords then ejected out Pharyngeal Comment Essentially WNL for age  CHL IP CERVICAL ESOPHAGEAL PHASE 05/05/2017 Cervical Esophageal Phase WFL Pudding Teaspoon -- Pudding Cup -- Honey Teaspoon -- Honey Cup -- Nectar Teaspoon -- Nectar Cup -- Nectar Straw -- Thin Teaspoon -- Thin Cup -- Thin Straw -- Puree -- Mechanical Soft -- Regular -- Multi-consistency -- Pill -- Cervical Esophageal Comment -- No flowsheet data found. Thank you, Genene Churn, [place] 05/05/2017, 1:27 PM                 Subjective: Feeling  better.  She is confused.  Denies any shortness of breath.  Discharge Exam: Vitals:   05/06/17 0535 05/06/17 0758  BP: 136/69   Pulse: 88   Resp:    Temp: 98.4 F (36.9 C)   SpO2: 100% 96%   Vitals:   05/05/17 2144 05/06/17 0153 05/06/17 0535 05/06/17 0758  BP: 135/69  136/69   Pulse: 98  88   Resp:      Temp: 98.1 F (36.7 C)  98.4 F (36.9 C)   TempSrc: Oral  Oral   SpO2: 99% 97% 100% 96%  Weight:      Height:        General: Pt is alert, awake, not in acute distress Cardiovascular: RRR, S1/S2 +, no rubs, no gallops Respiratory: Mild rhonchi in upper airways.  Abdominal: Soft, NT, ND, bowel sounds + Extremities: no edema, no cyanosis    The results of significant diagnostics from this hospitalization (including imaging, microbiology, ancillary and laboratory) are listed below for reference.     Microbiology: Recent Results (from the past 240 hour(s))  Culture, blood (routine x 2)     Status: Abnormal   Collection Time: 05/03/17  9:27 AM  Result Value Ref Range Status   Specimen Description   Final    BLOOD RIGHT HAND Performed at [facility], [address], [place], [place] [address]    Special Requests   Final    BOTTLES DRAWN AEROBIC AND ANAEROBIC Blood Culture adequate volume Performed at [facility], [address], [place], [place] [address]    Culture  Setup Time   Final    GRAM POSITIVE COCCI IN BOTH AEROBIC AND ANAEROBIC BOTTLES Gram Stain Report Called to,Read Back By and Verified With: LIGHTNER,N @ [id number] AT 2.21.19 BY BOWMAN,L CRITICAL RESULT CALLED TO, READ BACK BY AND VERIFIED WITH: S. Hurth Pharm.D. 10:25 05/04/17 ([email or web address])    Culture (A)  Final    STAPHYLOCOCCUS SPECIES (COAGULASE NEGATIVE) THE SIGNIFICANCE OF ISOLATING THIS ORGANISM FROM A SINGLE SET OF BLOOD CULTURES WHEN MULTIPLE SETS ARE DRAWN IS UNCERTAIN. PLEASE NOTIFY THE MICROBIOLOGY DEPARTMENT WITHIN ONE WEEK IF SPECIATION AND SENSITIVITIES ARE REQUIRED. Performed at [facility], [place] [address], [place], [place] [address]    Report Status 05/06/2017 FINAL  Final  Blood Culture ID Panel (Reflexed)     Status: Abnormal   Collection Time: 05/03/17  9:27 AM  Result Value Ref Range Status   Enterococcus species NOT DETECTED NOT DETECTED Final   Listeria monocytogenes NOT DETECTED NOT DETECTED Final   Staphylococcus species DETECTED (A) NOT DETECTED Final    Comment: Methicillin (oxacillin) resistant coagulase negative staphylococcus. Possible blood culture contaminant (unless isolated from more than one blood culture draw or clinical case suggests pathogenicity). No antibiotic treatment is indicated for blood  culture contaminants. CRITICAL RESULT CALLED TO, READ BACK BY AND VERIFIED WITH: S. Hurth Pharm.D. 10:25 05/04/17 ([email or web address])    Staphylococcus aureus NOT DETECTED NOT DETECTED Final   Methicillin resistance DETECTED (A) NOT DETECTED Final    Comment: CRITICAL RESULT CALLED TO, READ BACK BY AND VERIFIED WITH: S. Hurth Pharm.D. 10:25 05/04/17 ([email or web address])    Streptococcus species NOT DETECTED NOT DETECTED Final   Streptococcus agalactiae NOT DETECTED NOT DETECTED Final   Streptococcus pneumoniae NOT DETECTED NOT DETECTED Final   Streptococcus pyogenes NOT DETECTED NOT DETECTED Final   Acinetobacter baumannii NOT DETECTED NOT DETECTED Final   Enterobacteriaceae species NOT DETECTED NOT DETECTED Final   Enterobacter cloacae complex NOT DETECTED NOT DETECTED Final   Escherichia coli NOT DETECTED NOT DETECTED Final   Klebsiella oxytoca NOT DETECTED NOT DETECTED Final   Klebsiella pneumoniae NOT DETECTED NOT DETECTED Final   Proteus species NOT DETECTED NOT DETECTED Final   Serratia marcescens NOT DETECTED NOT DETECTED Final   Haemophilus influenzae NOT DETECTED NOT DETECTED Final   Neisseria meningitidis NOT DETECTED NOT DETECTED Final   Pseudomonas aeruginosa NOT DETECTED NOT DETECTED Final   Candida albicans NOT DETECTED NOT DETECTED Final   Candida glabrata NOT  DETECTED NOT DETECTED Final   Candida krusei NOT DETECTED NOT DETECTED Final   Candida parapsilosis NOT DETECTED NOT DETECTED Final   Candida tropicalis NOT DETECTED NOT DETECTED Final  Culture, blood (routine x 2)     Status: None (Preliminary result)   Collection Time:  05/03/17  9:34 AM  Result Value Ref Range Status   Specimen Description LEFT ANTECUBITAL  Final   Special Requests   Final    BOTTLES DRAWN AEROBIC AND ANAEROBIC Blood Culture adequate volume   Culture   Final    NO GROWTH 3 DAYS Performed at [facility], [address] [place], [place] [address]    Report Status PENDING  Incomplete     Labs: BNP (last 3 results) Recent Labs    03/31/17 1852  BNP 086.7*   Basic Metabolic Panel: Recent Labs  Lab 05/01/17 0015 05/03/17 0914 05/04/17 0452 05/05/17 0601  NA 142 136 137 139  K 3.3* 2.8* 4.5 5.0  CL 98* 93* 102 106  CO2 37* 29 26 24   GLUCOSE 270* 296* 339* 260*  BUN 24* 22* 17 14  CREATININE 1.43* 1.60* 1.14* 1.16*  CALCIUM 9.3 9.3 8.4* 9.5  MG  --  1.7  --   --    Liver Function Tests: Recent Labs  Lab 05/04/17 0452  AST 21  ALT 14  ALKPHOS 62  BILITOT 0.5  PROT 5.7*  ALBUMIN 2.6*   No results for input(s): LIPASE, AMYLASE in the last 168 hours. No results for input(s): AMMONIA in the last 168 hours. CBC: Recent Labs  Lab 05/03/17 0914 05/04/17 0452  WBC 10.9* 9.4  NEUTROABS 7.5  --   HGB 12.5 11.2*  HCT 42.1 39.1  MCV 89.8 91.1  PLT 169 134*   Cardiac Enzymes: Recent Labs  Lab 05/03/17 0914  TROPONINI <0.03   BNP: Invalid input(s): POCBNP CBG: Recent Labs  Lab 05/05/17 0801 05/05/17 1134 05/05/17 1758 05/05/17 2116 05/06/17 0751  GLUCAP 250* 318* 358* 316* 157*   D-Dimer No results for input(s): DDIMER in the last 72 hours. Hgb A1c No results for input(s): HGBA1C in the last 72 hours. Lipid Profile No results for input(s): CHOL, HDL, LDLCALC, TRIG, CHOLHDL, LDLDIRECT in the last 72 hours. Thyroid function  studies No results for input(s): TSH, T4TOTAL, T3FREE, THYROIDAB in the last 72 hours.  Invalid input(s): FREET3 Anemia work up No results for input(s): VITAMINB12, FOLATE, FERRITIN, TIBC, IRON, RETICCTPCT in the last 72 hours. Urinalysis    Component Value Date/Time   COLORURINE YELLOW 05/03/2017 0914   APPEARANCEUR CLEAR 05/03/2017 0914   LABSPEC 1.005 05/03/2017 0914   PHURINE 6.0 05/03/2017 0914   GLUCOSEU NEGATIVE 05/03/2017 0914   HGBUR NEGATIVE 05/03/2017 0914   BILIRUBINUR NEGATIVE 05/03/2017 0914   KETONESUR NEGATIVE 05/03/2017 0914   PROTEINUR NEGATIVE 05/03/2017 0914   UROBILINOGEN 1.0 10/11/2014 1428   NITRITE NEGATIVE 05/03/2017 0914   LEUKOCYTESUR NEGATIVE 05/03/2017 0914   Sepsis Labs Invalid input(s): PROCALCITONIN,  WBC,  LACTICIDVEN Microbiology Recent Results (from the past 240 hour(s))  Culture, blood (routine x 2)     Status: Abnormal   Collection Time: 05/03/17  9:27 AM  Result Value Ref Range Status   Specimen Description   Final    BLOOD RIGHT HAND Performed at [facility], [address] [place], [place] [address]    Special Requests   Final    BOTTLES DRAWN AEROBIC AND ANAEROBIC Blood Culture adequate volume Performed at [facility], [address], [place], [place] [address]    Culture  Setup Time   Final    GRAM POSITIVE COCCI IN BOTH AEROBIC AND ANAEROBIC BOTTLES Gram Stain Report Called to,Read Back By and Verified With: LIGHTNER,N @ 1245 AT 2.21.19 BY BOWMAN,L CRITICAL RESULT CALLED TO, READ BACK BY AND  VERIFIED WITH: S. Hurth Pharm.D. 10:25 05/04/17 ([email or web address])    Culture (A)  Final    STAPHYLOCOCCUS SPECIES (COAGULASE NEGATIVE) THE SIGNIFICANCE OF ISOLATING THIS ORGANISM FROM A SINGLE SET OF BLOOD CULTURES WHEN MULTIPLE SETS ARE DRAWN IS UNCERTAIN. PLEASE NOTIFY THE MICROBIOLOGY DEPARTMENT WITHIN ONE WEEK IF SPECIATION AND SENSITIVITIES ARE REQUIRED. Performed at [facility], [place] [address] [place], [place] [address]    Report  Status 05/06/2017 FINAL  Final  Blood Culture ID Panel (Reflexed)     Status: Abnormal   Collection Time: 05/03/17  9:27 AM  Result Value Ref Range Status   Enterococcus species NOT DETECTED NOT DETECTED Final   Listeria monocytogenes NOT DETECTED NOT DETECTED Final   Staphylococcus species DETECTED (A) NOT DETECTED Final    Comment: Methicillin (oxacillin) resistant coagulase negative staphylococcus. Possible blood culture contaminant (unless isolated from more than one blood culture draw or clinical case suggests pathogenicity). No antibiotic treatment is indicated for blood  culture contaminants. CRITICAL RESULT CALLED TO, READ BACK BY AND VERIFIED WITH: S. Hurth Pharm.D. 10:25 05/04/17 ([email or web address])    Staphylococcus aureus NOT DETECTED NOT DETECTED Final   Methicillin resistance DETECTED (A) NOT DETECTED Final    Comment: CRITICAL RESULT CALLED TO, READ BACK BY AND VERIFIED WITH: S. Hurth Pharm.D. 10:25 05/04/17 ([email or web address])    Streptococcus species NOT DETECTED NOT DETECTED Final   Streptococcus agalactiae NOT DETECTED NOT DETECTED Final   Streptococcus pneumoniae NOT DETECTED NOT DETECTED Final   Streptococcus pyogenes NOT DETECTED NOT DETECTED Final   Acinetobacter baumannii NOT DETECTED NOT DETECTED Final   Enterobacteriaceae species NOT DETECTED NOT DETECTED Final   Enterobacter cloacae complex NOT DETECTED NOT DETECTED Final   Escherichia coli NOT DETECTED NOT DETECTED Final   Klebsiella oxytoca NOT DETECTED NOT DETECTED Final   Klebsiella pneumoniae NOT DETECTED NOT DETECTED Final   Proteus species NOT DETECTED NOT DETECTED Final   Serratia marcescens NOT DETECTED NOT DETECTED Final   Haemophilus influenzae NOT DETECTED NOT DETECTED Final   Neisseria meningitidis NOT DETECTED NOT DETECTED Final   Pseudomonas aeruginosa NOT DETECTED NOT DETECTED Final   Candida albicans NOT DETECTED NOT DETECTED Final   Candida glabrata NOT DETECTED NOT DETECTED Final   Candida krusei NOT DETECTED  NOT DETECTED Final   Candida parapsilosis NOT DETECTED NOT DETECTED Final   Candida tropicalis NOT DETECTED NOT DETECTED Final  Culture, blood (routine x 2)     Status: None (Preliminary result)   Collection Time: 05/03/17  9:34 AM  Result Value Ref Range Status   Specimen Description LEFT ANTECUBITAL  Final   Special Requests   Final    BOTTLES DRAWN AEROBIC AND ANAEROBIC Blood Culture adequate volume   Culture   Final    NO GROWTH 3 DAYS Performed at [facility], [address] [place], [place] [address]    Report Status PENDING  Incomplete     Time coordinating discharge: Over 30 minutes  SIGNED:   Kathie Dike, MD  [facility] 05/06/2017, 12:13 PM Pager   If 7PM-7AM, please contact night-coverage [email or web address] Password [email or web address]

## 2017-05-06 NOTE — Progress Notes (Signed)
Patient will Discharge To: [facility] Anticipated DC Date:05/06/17 Family Notified: Yes Catarina Hartshorn, [phone] Transport By: Nurse Larene Beach   Per MD patient ready for DC to [facility] . RN, patient, patient's family, and facility notified of DC. Assessment, Fl2/Pasrr, and Discharge Summary sent to facility. RN given number for report [phone] ask for [facility]). DC packet on chart. Ambulance transport requested for patient.   CSW signing off.  Reed Breech LCSWA [phone]

## 2017-05-06 NOTE — Progress Notes (Signed)
Removed both IVs-clean, dry, intact. Called report to [facility] and talked with Roselyn Reef and Eddie Dibbles. NT wheeled stable pt to [facility].

## 2017-05-08 ENCOUNTER — Encounter (HOSPITAL_COMMUNITY)
Admission: RE | Admit: 2017-05-08 | Discharge: 2017-05-08 | Disposition: A | Discharge status: Home / Self Care | Payer: Medicare Other | Source: Skilled Nursing Facility | Attending: *Deleted | Admitting: *Deleted

## 2017-05-08 LAB — BASIC METABOLIC PANEL
Anion gap: 10 (ref 5–15)
BUN: 22 mg/dL — ABNORMAL HIGH (ref 6–20)
CHLORIDE: 98 mmol/L — AB (ref 101–111)
CO2: 31 mmol/L (ref 22–32)
Calcium: 9.4 mg/dL (ref 8.9–10.3)
Creatinine, Ser: 1.22 mg/dL — ABNORMAL HIGH (ref 0.44–1.00)
GFR calc non Af Amer: 39 mL/min — ABNORMAL LOW (ref >60)
GFR, EST AFRICAN AMERICAN: 45 mL/min — AB (ref >60)
GLUCOSE: 177 mg/dL — AB (ref 65–99)
Potassium: 3.8 mmol/L (ref 3.5–5.1)
Sodium: 139 mmol/L (ref 135–145)

## 2017-05-08 LAB — CBC
HEMATOCRIT: 39.3 % (ref 36.0–46.0)
HEMOGLOBIN: 11.7 g/dL — AB (ref 12.0–15.0)
MCH: 26.3 pg (ref 26.0–34.0)
MCHC: 29.8 g/dL — AB (ref 30.0–36.0)
MCV: 88.3 fL (ref 78.0–100.0)
Platelets: 116 10*3/uL — ABNORMAL LOW (ref 150–400)
RBC: 4.45 MIL/uL (ref 3.87–5.11)
RDW: 17.6 % — ABNORMAL HIGH (ref 11.5–15.5)
WBC: 7.2 10*3/uL (ref 4.0–10.5)

## 2017-05-08 LAB — CULTURE, BLOOD (ROUTINE X 2)
CULTURE: NO GROWTH
SPECIAL REQUESTS: ADEQUATE

## 2017-05-09 ENCOUNTER — Encounter: Payer: Self-pay | Admitting: Internal Medicine

## 2017-05-09 ENCOUNTER — Non-Acute Institutional Stay (SKILLED_NURSING_FACILITY): Payer: Medicare Other | Admitting: Internal Medicine

## 2017-05-09 DIAGNOSIS — F0390 Unspecified dementia without behavioral disturbance: Secondary | ICD-10-CM

## 2017-05-09 DIAGNOSIS — N183 Chronic kidney disease, stage 3 unspecified: Secondary | ICD-10-CM

## 2017-05-09 DIAGNOSIS — J441 Chronic obstructive pulmonary disease with (acute) exacerbation: Secondary | ICD-10-CM | POA: Diagnosis not present

## 2017-05-09 DIAGNOSIS — M069 Rheumatoid arthritis, unspecified: Secondary | ICD-10-CM | POA: Diagnosis not present

## 2017-05-09 DIAGNOSIS — E1140 Type 2 diabetes mellitus with diabetic neuropathy, unspecified: Secondary | ICD-10-CM

## 2017-05-09 DIAGNOSIS — J189 Pneumonia, unspecified organism: Secondary | ICD-10-CM

## 2017-05-09 DIAGNOSIS — S32010D Wedge compression fracture of first lumbar vertebra, subsequent encounter for fracture with routine healing: Secondary | ICD-10-CM | POA: Diagnosis not present

## 2017-05-09 NOTE — Progress Notes (Signed)
Provider: Veleta Miners   Location:   [facility] Room Number: 151/P Place of Service:  SNF (31)  PCP: Celene Squibb, MD Patient Care Team: Celene Squibb, MD as PCP - General  Extended Emergency Contact Information Primary Emergency Contact: Nance,Sheila Address: [address]          [place], [place] [address] [place] Phone: [phone] Relation: Daughter Secondary Emergency Contact: Charlynne Pander,  [address] [place] Phone: [phone] Relation: Grandaughter  Code Status: DNR Goals of Care: Advanced Directive information Advanced Directives 05/09/2017  Does Patient Have a Medical Advance Directive? Yes  Type of Advance Directive Out of facility DNR (pink MOST or yellow form)  Does patient want to make changes to medical advance directive? No - Patient declined  Copy of [place] in Chart? No - copy requested  Would patient like information on creating a medical advance directive? No - Patient declined  Pre-existing out of facility DNR order (yellow form or pink MOST form) -      Chief Complaint  Patient presents with  . Readmit To SNF    Readmission Visit    HPI: Patient is a 84 y.o. female seen today for Readmission to facility after staying in the hospital from   Patient has h/o COPD, Chronic Atrial Fibrillation on Eliquis, Rheumatoid arthritis, Fibromyalgia, Peripheral Neuropathy, Hyperlipidemia, PUD, and anxiety disorder  Patient was initially admitted to SNF for therapy after being in the hospital for pneumonia and fall..Since she continued to have confusion and hallucinations with productive cough. She had a repeat chest x-ray which showed acute right upper lobe pneumonia. She also had shortness of breath with wheezing. She was started on Augmentin and prednisone.and Lasix She was agin send to the hospital as she desaturated and continue to have fever. She was  admitted from 02/20-02/23 For multi Lobular Pneumonia. She was treated with IV antibiotics, Prednisone taper and Bronchodilators. She is also on Modified diet. She is now on PO Augmentin. Patient doing well. Her stas are stable on 3l of O2. She still has Cough.But no fever. She is more alert and responsive. She is c/o Low back pain.  Per therapy not working with therapy much and wants to stay in bed most of the time.  Past Medical History:  Diagnosis Date  . Anemia   . Atrial fibrillation ([place])   . Back pain   . Bacterial pneumonia   . Cancer (HCC)    Skin   . Chronic respiratory failure ([place])   . Cognitive communication deficit   . COPD (chronic obstructive pulmonary disease) ([place])   . Dementia   . Dementia   . Depression   . Diabetes mellitus without complication ([place])   . Diverticulosis   . Diverticulosis   . Fatty liver   . Fibromyalgia   . Fibromyalgia   . Gastric polyp 09/08/11   at the anastomosis-inflammatory  . Generalized anxiety disorder   . Generalized anxiety disorder   . GERD (gastroesophageal reflux disease)   . GERD (gastroesophageal reflux disease)   . 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 Hypercholesteremia   . Hypertension   . Insomnia   . Internal hemorrhoids   . Muscle weakness   . Neuropathy   . On home O2    qhs  . Osteoporosis   . Rheumatoid arteritis   . Ulcer    stomach  .  Vitamin D deficiency    Past Surgical History:  Procedure Laterality Date  . ABDOMINAL HYSTERECTOMY    . ABDOMINAL SURGERY     removed patial stomach from ulcer  . COLONOSCOPY  04-2001   internal hemorrhoids, panocolonic diverticulosis, and colon polyps   . UPPER GASTROINTESTINAL ENDOSCOPY      reports that  has never smoked. she has never used smokeless tobacco. She reports that she does not drink alcohol or use drugs. Social History   Socioeconomic History  . Marital status: Widowed    Spouse name: Not on file  . Number of children: 6  . Years of  education: Not on file  . Highest education level: Not on file  Social Needs  . Financial resource strain: Not on file  . Food insecurity - worry: Not on file  . Food insecurity - inability: Not on file  . Transportation needs - medical: Not on file  . Transportation needs - non-medical: Not on file  Occupational History  . Occupation: Retired  Tobacco Use  . Smoking status: Never Smoker  . Smokeless tobacco: Never Used  Substance and Sexual Activity  . Alcohol use: No  . Drug use: No  . Sexual activity: Not on file  Other Topics Concern  . Not on file  Social History Narrative   Daily caffeine     Functional Status Survey:    Family History  Problem Relation Age of Onset  . Heart disease Father   . Kidney disease Mother        kidney cancer  . Colon cancer Daughter 67    Health Maintenance  Topic Date Due  . INFLUENZA VACCINE  05/12/2017 (Originally 10/12/2016)  . DEXA SCAN  05/12/2017 (Originally 06/06/1995)  . PNA vac Low Risk Adult (1 of 2 - PCV13) 05/12/2017 (Originally 06/06/1995)  . FOOT EXAM  05/22/2017 (Originally 06/05/1938)  . OPHTHALMOLOGY EXAM  05/22/2017 (Originally 06/05/1938)  . URINE MICROALBUMIN  05/22/2017 (Originally 06/05/1938)  . HEMOGLOBIN A1C  09/30/2017  . TETANUS/TDAP  10/22/2026    Allergies  Allergen Reactions  . Other     Band Aids   . Tape   . Lipitor [Atorvastatin Calcium] Rash and Other (See Comments)    Muscle pain  . Niaspan [Niacin Er] Rash and Other (See Comments)    Muscle pain    Allergies as of 05/09/2017      Reactions   Other    Band Aids    Tape    Lipitor [atorvastatin Calcium] Rash, Other (See Comments)   Muscle pain   Niaspan [niacin Er] Rash, Other (See Comments)   Muscle pain      Medication List        Accurate as of 05/09/17  4:33 PM. Always use your most recent med list.          acetaminophen 325 MG tablet Commonly known as:  TYLENOL Take 650 mg by mouth every 8 (eight) hours as needed.     amoxicillin-clavulanate 875-125 MG tablet Commonly known as:  AUGMENTIN Take 1 tablet by mouth 2 (two) times daily for 10 days.   CARTIA XT 300 MG 24 hr capsule Generic drug:  diltiazem TAKE ONE CAPSULE BY MOUTH EVERY DAY   DAILY VITE PO Take 1 tablet by mouth daily.   donepezil 5 MG tablet Commonly known as:  ARICEPT Take 5 mg by mouth at bedtime.   DULoxetine 60 MG capsule Commonly known as:  CYMBALTA Take 60 mg by mouth daily.  ELIQUIS 2.5 MG Tabs tablet Generic drug:  apixaban Take 2.5 mg by mouth 2 (two) times daily.   furosemide 20 MG tablet Commonly known as:  LASIX Take 1 tablet (20 mg total) by mouth daily as needed for edema.   guaifenesin 100 MG/5ML syrup Commonly known as:  ROBITUSSIN Take 200 mg by mouth 4 (four) times daily as needed for cough.   HYDROcodone-acetaminophen 5-325 MG tablet Commonly known as:  NORCO/VICODIN Take 1 tablet by mouth every 8 (eight) hours as needed for moderate pain. Starting 04/24/2017   ipratropium-albuterol 0.5-2.5 (3) MG/3ML Soln Commonly known as:  DUONEB Take 3 mLs by nebulization every 6 (six) hours.   linagliptin 5 MG Tabs tablet Commonly known as:  TRADJENTA Take 1 tablet (5 mg total) by mouth daily.   LORazepam 0.5 MG tablet Commonly known as:  ATIVAN Take 0.5 tablets (0.25 mg total) by mouth 2 (two) times daily. Take for anxiety 14 days and to be reassessed  By MD.   Recardo Evangelist 100 MG capsule Generic drug:  pregabalin Take 100 mg by mouth 3 (three) times daily.   methotrexate 2.5 MG tablet Take 12.5 mg by mouth. On Thursday at noon.   pantoprazole 40 MG tablet Commonly known as:  PROTONIX Take 1 tablet (40 mg total) by mouth daily.   phenylephrine-shark liver oil-mineral oil-petrolatum 0.25-3-14-71.9 % rectal ointment Commonly known as:  PREPARATION H Place 1 application rectally 2 (two) times daily as needed for hemorrhoids.   polyethylene glycol packet Commonly known as:  MIRALAX / GLYCOLAX Take 17 g  by mouth daily as needed for moderate constipation.   predniSONE 20 MG tablet Commonly known as:  DELTASONE Take 40mg  po daily for 2 days then 30mg  daily for 2 days then 20mg  daily for 2 days then 10mg  daily for 2 days then stop   PROAIR HFA 108 (90 Base) MCG/ACT inhaler Generic drug:  albuterol Inhale 2 puffs into the lungs every 6 (six) hours as needed for wheezing or shortness of breath. For rescue   raloxifene 60 MG tablet Commonly known as:  EVISTA Take 60 mg by mouth daily.   rosuvastatin 20 MG tablet Commonly known as:  CRESTOR Take 20 mg by mouth daily.   VENELEX Oint Apply to sacrum and bilateral buttocks q shift and prn for prevention       Review of Systems  Constitutional: Negative.   HENT: Negative.   Respiratory: Positive for cough and shortness of breath.   Cardiovascular: Positive for leg swelling.  Gastrointestinal: Positive for abdominal pain and constipation.  Genitourinary: Negative.   Musculoskeletal: Positive for back pain.  Skin: Negative.   Neurological: Negative.   Psychiatric/Behavioral: Negative.     Vitals:   05/09/17 1633  BP: 140/74  Pulse: 98  Resp: (!) 22  Temp: 97.8 F (36.6 C)   There is no height or weight on file to calculate BMI. Physical Exam  Constitutional: She appears well-developed and well-nourished.  HENT:  Head: Normocephalic.  Mouth/Throat: Oropharynx is clear and moist.  Eyes: Pupils are equal, round, and reactive to light.  Neck: Neck supple.  Cardiovascular: Normal rate and normal heart sounds.  No murmur heard. Pulmonary/Chest: Effort normal and breath sounds normal. No respiratory distress. She has no wheezes. She has no rales.  Abdominal: Soft. Bowel sounds are normal. She exhibits no distension. There is no tenderness. There is no rebound.  Musculoskeletal:  Mild edema Bilateral  Lymphadenopathy:    She has no cervical adenopathy.  Neurological: She  is alert.  Not oriented. Says my Memory is not  good. No Focal deficits. Follows all commands and answer Appropriately.  Skin: Skin is warm and dry.  Psychiatric: She has a normal mood and affect. Her behavior is normal. Thought content normal.    Labs reviewed: Basic Metabolic Panel: Recent Labs    03/31/17 1852  04/02/17 0627  04/10/17 0943  05/03/17 0914 05/04/17 0452 05/05/17 0601 05/08/17 0400  NA 142   < > 140   < >  --    < > 136 137 139 139  K 3.5   < > 4.1   < >  --    < > 2.8* 4.5 5.0 3.8  CL 102   < > 102   < >  --    < > 93* 102 106 98*  CO2 29   < > 28   < >  --    < > 29 26 24 31   GLUCOSE 165*   < > 241*   < >  --    < > 296* 339* 260* 177*  BUN 17   < > 27*   < >  --    < > 22* 17 14 22*  CREATININE 1.29*   < > 1.35*   < >  --    < > 1.60* 1.14* 1.16* 1.22*  CALCIUM 9.7   < > 10.4*   < >  --    < > 9.3 8.4* 9.5 9.4  MG 2.0  --  2.1  --  1.8  --  1.7  --   --   --   PHOS 3.0  --   --   --   --   --   --   --   --   --    < > = values in this interval not displayed.   Liver Function Tests: Recent Labs    10/25/16 1924 03/31/17 1852 05/04/17 0452  AST 47* 30 21  ALT 39 17 14  ALKPHOS 75 70 62  BILITOT 0.6 0.7 0.5  PROT 6.4* 6.9 5.7*  ALBUMIN 3.1* 3.2* 2.6*   Recent Labs    07/26/16 0421  LIPASE 19   No results for input(s): AMMONIA in the last 8760 hours. CBC: Recent Labs    04/17/17 0330 04/20/17 0745  05/03/17 0914 05/04/17 0452 05/08/17 0400  WBC 9.5 11.6*   < > 10.9* 9.4 7.2  NEUTROABS 7.0 9.2*  --  7.5  --   --   HGB 11.9* 11.5*   < > 12.5 11.2* 11.7*  HCT 40.2 38.4   < > 42.1 39.1 39.3  MCV 88.9 87.1   < > 89.8 91.1 88.3  PLT 149* 182   < > 169 134* 116*   < > = values in this interval not displayed.   Cardiac Enzymes: Recent Labs    07/26/16 0421 03/31/17 1852 05/03/17 0914  CKTOTAL 39  --   --   TROPONINI  --  <0.03 <0.03   BNP: Invalid input(s): POCBNP Lab Results  Component Value Date   HGBA1C 7.6 (H) 04/02/2017   Lab Results  Component Value Date   TSH 2.992  07/26/2016   Lab Results  Component Value Date   [id number] 656 09/12/2014   Lab Results  Component Value Date   FOLATE  04/15/2010    >20.0 (NOTE)  Reference Ranges        Deficient:  0.4 - 3.3 ng/mL        Indeterminate:   3.4 - 5.4 ng/mL        Normal:              > 5.4 ng/mL   Lab Results  Component Value Date   IRON 49 04/15/2010   TIBC 230 (L) 04/15/2010   FERRITIN 100 04/15/2010    Imaging and Procedures obtained prior to SNF admission: No results found.  Assessment/Plan  Multilobular Pneumonia Patient on Augmentin Doing well. Repeat Chest xray in 4 weeks. Afebrile  Had Modified Swallowing this admission and is on D3 Dysphagia Diet.  COPD exacerbation  On prednisone taper On Bronchodialtors PRN Continuous o2   type 2 diabetes mellitus  On Tradjenta BS running high due to Prednisone Will start Sliding scale for few days  Atrial fibrillation Rate controlled on diltiazem On Eliquis  Rheumatoid arthritis, On Chronic Methotrexate Epigastric Pain She was c/o Epigastric discomfort Will increase her Protonix to BID for 2 weeks Use Zofran PRN right now for Nausea  Dementia  Patient not really making much progress with therapy She says she is tired. Will need Long term care. CKD (chronic kidney disease), stage III  Creat Stable  Closed compression fracture of L1 lumbar vertebra  On Prn Percocet  Family/ staff Communication:   Labs/tests ordered:  Repeat Labs and Chest Xray  Total time spent in this patient care encounter was 45_ minutes; greater than 50% of the visit spent counseling patient, reviewing records , Labs and coordinating care for problems addressed at this encounter.

## 2017-05-19 ENCOUNTER — Other Ambulatory Visit: Payer: Self-pay

## 2017-05-19 MED ORDER — HYDROCODONE-ACETAMINOPHEN 5-325 MG PO TABS: 1.0000 | ORAL_TABLET | Freq: Three times a day (TID) | ORAL | 0 refills | Status: DC | PRN

## 2017-05-19 NOTE — Telephone Encounter (Signed)
RX Fax for [facility]@ [phone]  

## 2017-05-24 ENCOUNTER — Other Ambulatory Visit: Payer: Self-pay

## 2017-05-24 MED ORDER — HYDROCODONE-ACETAMINOPHEN 5-325 MG PO TABS: 1.0000 | ORAL_TABLET | Freq: Three times a day (TID) | ORAL | 0 refills | Status: DC | PRN

## 2017-05-24 NOTE — Telephone Encounter (Signed)
RX Fax for [facility]@ [phone]  

## 2017-06-01 ENCOUNTER — Ambulatory Visit (HOSPITAL_COMMUNITY): Payer: Medicare Other

## 2017-06-01 ENCOUNTER — Non-Acute Institutional Stay (SKILLED_NURSING_FACILITY): Payer: Medicare Other | Admitting: Internal Medicine

## 2017-06-01 ENCOUNTER — Other Ambulatory Visit (HOSPITAL_COMMUNITY)
Admission: RE | Admit: 2017-06-01 | Discharge: 2017-06-01 | Disposition: A | Discharge status: Home / Self Care | Payer: Medicare Other | Source: Skilled Nursing Facility | Attending: Internal Medicine | Admitting: Internal Medicine

## 2017-06-01 ENCOUNTER — Encounter: Payer: Self-pay | Admitting: Internal Medicine

## 2017-06-01 DIAGNOSIS — J441 Chronic obstructive pulmonary disease with (acute) exacerbation: Secondary | ICD-10-CM | POA: Diagnosis not present

## 2017-06-01 DIAGNOSIS — R05 Cough: Secondary | ICD-10-CM | POA: Diagnosis not present

## 2017-06-01 DIAGNOSIS — I482 Chronic atrial fibrillation, unspecified: Secondary | ICD-10-CM

## 2017-06-01 DIAGNOSIS — E1140 Type 2 diabetes mellitus with diabetic neuropathy, unspecified: Secondary | ICD-10-CM | POA: Diagnosis not present

## 2017-06-01 DIAGNOSIS — I5033 Acute on chronic diastolic (congestive) heart failure: Secondary | ICD-10-CM

## 2017-06-01 DIAGNOSIS — R0602 Shortness of breath: Secondary | ICD-10-CM | POA: Diagnosis not present

## 2017-06-01 LAB — BASIC METABOLIC PANEL
Anion gap: 11 (ref 5–15)
BUN: 12 mg/dL (ref 6–20)
CHLORIDE: 97 mmol/L — AB (ref 101–111)
CO2: 28 mmol/L (ref 22–32)
CREATININE: 1.4 mg/dL — AB (ref 0.44–1.00)
Calcium: 8.7 mg/dL — ABNORMAL LOW (ref 8.9–10.3)
GFR calc non Af Amer: 33 mL/min — ABNORMAL LOW (ref >60)
GFR, EST AFRICAN AMERICAN: 38 mL/min — AB (ref >60)
Glucose, Bld: 220 mg/dL — ABNORMAL HIGH (ref 65–99)
POTASSIUM: 2.8 mmol/L — AB (ref 3.5–5.1)
SODIUM: 136 mmol/L (ref 135–145)

## 2017-06-01 LAB — CBC WITH DIFFERENTIAL/PLATELET
BASOS PCT: 1 %
Basophils Absolute: 0 10*3/uL (ref 0.0–0.1)
Eosinophils Absolute: 0.3 10*3/uL (ref 0.0–0.7)
Eosinophils Relative: 4 %
HEMATOCRIT: 40.1 % (ref 36.0–46.0)
HEMOGLOBIN: 11.9 g/dL — AB (ref 12.0–15.0)
LYMPHS ABS: 3.4 10*3/uL (ref 0.7–4.0)
Lymphocytes Relative: 43 %
MCH: 26 pg (ref 26.0–34.0)
MCHC: 29.7 g/dL — ABNORMAL LOW (ref 30.0–36.0)
MCV: 87.7 fL (ref 78.0–100.0)
Monocytes Absolute: 1 10*3/uL (ref 0.1–1.0)
Monocytes Relative: 12 %
NEUTROS ABS: 3.1 10*3/uL (ref 1.7–7.7)
NEUTROS PCT: 40 %
Platelets: 115 10*3/uL — ABNORMAL LOW (ref 150–400)
RBC: 4.57 MIL/uL (ref 3.87–5.11)
RDW: 18.1 % — ABNORMAL HIGH (ref 11.5–15.5)
WBC: 7.8 10*3/uL (ref 4.0–10.5)

## 2017-06-01 NOTE — Progress Notes (Signed)
Location:   [facility] Room Number: 151/P Place of Service:  SNF (31) Provider:  Darrold Span, MD  Patient Care Team: Celene Squibb, MD as PCP - General  Extended Emergency Contact Information Primary Emergency Contact: Nance,Sheila Address: [address]          [place], [place] [address] [place] Phone: [phone] Relation: Daughter Secondary Emergency Contact: Charlynne Pander, [place] [address] [place] Phone: [phone] Relation: Grandaughter  Code Status:  DNR Goals of care: Advanced Directive information Advanced Directives 06/01/2017  Does Patient Have a Medical Advance Directive? Yes  Type of Advance Directive Out of facility DNR (pink MOST or yellow form)  Does patient want to make changes to medical advance directive? No - Patient declined  Copy of [place] in Chart? No - copy requested  Would patient like information on creating a medical advance directive? No - Patient declined  Pre-existing out of facility DNR order (yellow form or pink MOST form) -     Chief Complaint  Patient presents with  . Acute Visit    F/U Acute Visit    HPI:  Pt is a 84 y.o. female seen today for an acute visit for SOB,Cough And Wheezing. Patient has h/o COPD, Chronic Atrial Fibrillation on Eliquis, Rheumatoid arthritis, Fibromyalgia, Peripheral Neuropathy, Hyperlipidemia, PUD, and anxiety disorder  Patient was Recently in the hospital from 02/20-02/23 For multi Lobular Pneumonia. She was treated with IV antibiotics, Prednisone taper and Bronchodilators. Today patient is c/o Cough again with Productive sputum. SOB . No Fever or chills. Nurses have also Noticed her to have Wheezing on Exam. She does not have any Chest pain. She is more alert and was sitting in the Wheel chair. She is now doing therapy anymore and is going to be Long term Resident.       Past Medical  History:  Diagnosis Date  . Anemia   . Atrial fibrillation ([place])   . Back pain   . Bacterial pneumonia   . Cancer (HCC)    Skin   . Chronic respiratory failure ([place])   . Cognitive communication deficit   . COPD (chronic obstructive pulmonary disease) ([place])   . Dementia   . Dementia   . Depression   . Diabetes mellitus without complication ([place])   . Diverticulosis   . Diverticulosis   . Fatty liver   . Fibromyalgia   . Fibromyalgia   . Gastric polyp 09/08/11   at the anastomosis-inflammatory  . Generalized anxiety disorder   . Generalized anxiety disorder   . GERD (gastroesophageal reflux disease)   . GERD (gastroesophageal reflux disease)   . 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 Hypercholesteremia   . Hypertension   . Insomnia   . Internal hemorrhoids   . Muscle weakness   . Neuropathy   . On home O2    qhs  . Osteoporosis   . Rheumatoid arteritis   . Ulcer    stomach  . Vitamin D deficiency    Past Surgical History:  Procedure Laterality Date  . ABDOMINAL HYSTERECTOMY    . ABDOMINAL SURGERY     removed patial stomach from ulcer  . COLONOSCOPY  04-2001   internal hemorrhoids, panocolonic diverticulosis, and colon polyps   . UPPER GASTROINTESTINAL ENDOSCOPY      Allergies  Allergen Reactions  . Other  Band Aids   . Tape   . Lipitor [Atorvastatin Calcium] Rash and Other (See Comments)    Muscle pain  . Niaspan [Niacin Er] Rash and Other (See Comments)    Muscle pain    Outpatient Encounter Medications as of 06/01/2017  Medication Sig  . acetaminophen (TYLENOL) 325 MG tablet Take 650 mg by mouth every 8 (eight) hours as needed.  Marland Kitchen albuterol (PROAIR HFA) 108 (90 BASE) MCG/ACT inhaler Inhale 2 puffs into the lungs every 6 (six) hours as needed for wheezing or shortness of breath. For rescue   . apixaban (ELIQUIS) 2.5 MG TABS tablet Take 2.5 mg by mouth 2 (two) times daily.  Roseanne Kaufman Peru-Castor Oil (VENELEX) OINT Apply to sacrum and bilateral  buttocks q shift and prn for prevention  . CARTIA XT 300 MG 24 hr capsule TAKE ONE CAPSULE BY MOUTH EVERY DAY  . donepezil (ARICEPT) 5 MG tablet Take 5 mg by mouth at bedtime.  . DULoxetine (CYMBALTA) 60 MG capsule Take 60 mg by mouth daily.    . furosemide (LASIX) 20 MG tablet Take 1 tablet (20 mg total) by mouth daily as needed for edema.  Marland Kitchen guaifenesin (ROBITUSSIN) 100 MG/5ML syrup Take 200 mg by mouth 4 (four) times daily as needed for cough.  Marland Kitchen HYDROcodone-acetaminophen (NORCO/VICODIN) 5-325 MG tablet Take 1 tablet by mouth every 8 (eight) hours as needed for moderate pain. Starting 04/24/2017  . insulin aspart (NOVOLOG FLEXPEN) 100 UNIT/ML FlexPen Give before meals and at bedtime  Per sliding scale  . ipratropium-albuterol (DUONEB) 0.5-2.5 (3) MG/3ML SOLN Take 3 mLs by nebulization every 6 (six) hours.  Marland Kitchen linagliptin (TRADJENTA) 5 MG TABS tablet Take 1 tablet (5 mg total) by mouth daily.  Marland Kitchen LYRICA 100 MG capsule Take 100 mg by mouth 3 (three) times daily.  . methotrexate 2.5 MG tablet Take 12.5 mg by mouth. On Thursday at noon.  . Multiple Vitamin (DAILY VITE PO) Take 1 tablet by mouth daily.  . pantoprazole (PROTONIX) 40 MG tablet Take 1 tablet (40 mg total) by mouth daily.  . phenylephrine-shark liver oil-mineral oil-petrolatum (PREPARATION H) 0.25-3-14-71.9 % rectal ointment Place 1 application rectally 2 (two) times daily as needed for hemorrhoids.  . polyethylene glycol (MIRALAX / GLYCOLAX) packet Take 17 g by mouth daily as needed for moderate constipation.  . raloxifene (EVISTA) 60 MG tablet Take 60 mg by mouth daily.  . rosuvastatin (CRESTOR) 20 MG tablet Take 20 mg by mouth daily.  . [DISCONTINUED] LORazepam (ATIVAN) 0.5 MG tablet Take 0.5 tablets (0.25 mg total) by mouth 2 (two) times daily. Take for anxiety 14 days and to be reassessed  By MD.  . [DISCONTINUED] predniSONE (DELTASONE) 20 MG tablet Take 40mg  po daily for 2 days then 30mg  daily for 2 days then 20mg  daily for 2 days  then 10mg  daily for 2 days then stop   No facility-administered encounter medications on file as of 06/01/2017.      Review of Systems  Review of Systems  Constitutional: Negative for activity change, appetite change, chills, diaphoresis, fatigue and fever.  HENT: Negative for mouth sores, postnasal drip, rhinorrhea, sinus pain and sore throat.   Respiratory: Positive  for apnea, cough,  shortness of breath and wheezing.   Cardiovascular: Negative for chest pain, palpitations and Positive for leg swelling.  Gastrointestinal: Negative for abdominal distention, abdominal pain, constipation, diarrhea, nausea and vomiting.  Genitourinary: Negative for dysuria and frequency.  Musculoskeletal: Negative for arthralgias, joint swelling and myalgias.  Skin:  Negative for rash.  Neurological: Negative for dizziness, syncope, weakness, light-headedness and numbness.  Psychiatric/Behavioral: Negative for behavioral problems, confusion and sleep disturbance.     Immunization History  Administered Date(s) Administered  . Tdap 10/21/2016   Pertinent  Health Maintenance Due  Topic Date Due  . INFLUENZA VACCINE  07/02/2017 (Originally 10/12/2016)  . FOOT EXAM  07/02/2017 (Originally 06/05/1938)  . OPHTHALMOLOGY EXAM  07/02/2017 (Originally 06/05/1938)  . URINE MICROALBUMIN  07/02/2017 (Originally 06/05/1938)  . DEXA SCAN  07/02/2017 (Originally 06/06/1995)  . PNA vac Low Risk Adult (1 of 2 - PCV13) 07/02/2017 (Originally 06/06/1995)  . HEMOGLOBIN A1C  09/30/2017   No flowsheet data found. Functional Status Survey:    Vitals:   06/01/17 1554  BP: (!) 145/62  Pulse: 81  Resp: 18  Temp: 97.9 F (36.6 C)  SpO2: 95%   There is no height or weight on file to calculate BMI. Physical Exam  Constitutional: She is oriented to person, place, and time. She appears well-developed and well-nourished.  HENT:  Head: Normocephalic.  Mouth/Throat: Oropharynx is clear and moist.  Eyes: Pupils are equal,  round, and reactive to light.  Neck: Neck supple.  Cardiovascular: Normal rate and normal heart sounds.  No murmur heard. Pulmonary/Chest: Effort normal.  Patient has Wheezing bilateral  Abdominal: Soft. Bowel sounds are normal. She exhibits no distension. There is no tenderness. There is no rebound.  Musculoskeletal:  Mild edema Bilateral  Lymphadenopathy:    She has no cervical adenopathy.  Neurological: She is alert and oriented to person, place, and time.  Was very alert today and following all commands.  Skin: Skin is warm and dry.  Psychiatric: She has a normal mood and affect. Her behavior is normal. Thought content normal.    Labs reviewed: Recent Labs    03/31/17 1852  04/02/17 0627  04/10/17 0943  05/03/17 0914  05/05/17 0601 05/08/17 0400 06/01/17 1353  NA 142   < > 140   < >  --    < > 136   < > 139 139 136  K 3.5   < > 4.1   < >  --    < > 2.8*   < > 5.0 3.8 2.8*  CL 102   < > 102   < >  --    < > 93*   < > 106 98* 97*  CO2 29   < > 28   < >  --    < > 29   < > 24 31 28   GLUCOSE 165*   < > 241*   < >  --    < > 296*   < > 260* 177* 220*  BUN 17   < > 27*   < >  --    < > 22*   < > 14 22* 12  CREATININE 1.29*   < > 1.35*   < >  --    < > 1.60*   < > 1.16* 1.22* 1.40*  CALCIUM 9.7   < > 10.4*   < >  --    < > 9.3   < > 9.5 9.4 8.7*  MG 2.0  --  2.1  --  1.8  --  1.7  --   --   --   --   PHOS 3.0  --   --   --   --   --   --   --   --   --   --    < > =  values in this interval not displayed.   Recent Labs    10/25/16 1924 03/31/17 1852 05/04/17 0452  AST 47* 30 21  ALT 39 17 14  ALKPHOS 75 70 62  BILITOT 0.6 0.7 0.5  PROT 6.4* 6.9 5.7*  ALBUMIN 3.1* 3.2* 2.6*   Recent Labs    04/20/17 0745  05/03/17 0914 05/04/17 0452 05/08/17 0400 06/01/17 1353  WBC 11.6*   < > 10.9* 9.4 7.2 7.8  NEUTROABS 9.2*  --  7.5  --   --  3.1  HGB 11.5*   < > 12.5 11.2* 11.7* 11.9*  HCT 38.4   < > 42.1 39.1 39.3 40.1  MCV 87.1   < > 89.8 91.1 88.3 87.7  PLT 182   < >  169 134* 116* 115*   < > = values in this interval not displayed.   Lab Results  Component Value Date   TSH 2.992 07/26/2016   Lab Results  Component Value Date   HGBA1C 7.6 (H) 04/02/2017   No results found for: CHOL, HDL, LDLCALC, LDLDIRECT, TRIG, CHOLHDL  Significant Diagnostic Results in last 30 days:  Dg Chest 2 View  Result Date: 06/01/2017 CLINICAL DATA:  Bilateral pneumonia.  Shortness of breath. EXAM: CHEST - 2 VIEW COMPARISON:  05/03/2017.  04/10/2017. FINDINGS: Mediastinum and hilar structures are normal. Stable cardiac persistent but improved right upper lobe. Improving bilateral interstitial prominence suggesting improving CHF. Small right pleural effusion again noted. No left pleural effusion on today's exam. No pneumothorax. IMPRESSION: 1. partial clearing of right upper lobe infiltrate. 2. Stable cardiomegaly. Partial clearing of bilateral interstitial prominence consistent with clearing interstitial edema. Electronically Signed   By: Marcello Moores  Register   On: 06/01/2017 13:55   Dg Chest Portable 1 View  Result Date: 05/03/2017 CLINICAL DATA:  Cough EXAM: PORTABLE CHEST 1 VIEW COMPARISON:  04/10/2017 FINDINGS: There is persistent right upper lobe airspace disease most concerning for pneumonia. There is diffuse bilateral interstitial thickening. There is no significant pleural effusion. There is no pneumothorax. There is stable cardiomegaly. The osseous structures are unremarkable. IMPRESSION: 1. Persistent right upper lobe pneumonia. 2. Bilateral mild interstitial thickening primarily at the lung bases which may reflect multi lobar pneumonia versus mild CHF. Electronically Signed   By: Kathreen Devoid   On: 05/03/2017 09:16   Dg Swallowing Func-speech Pathology  Result Date: 05/05/2017 Objective Swallowing Evaluation: Type of Study: MBS-Modified Barium Swallow Study  Patient Details Name: [name] MRN: [id number] Date of Birth: 1928-04-27 Today's Date: 05/05/2017 Time: SLP  Start Time (ACUTE ONLY): 0855 -SLP Stop Time (ACUTE ONLY): 0920 SLP Time Calculation (min) (ACUTE ONLY): 25 min Past Medical History: Past Medical History: Diagnosis Date . Anemia  . Atrial fibrillation ([place])  . Back pain  . Bacterial pneumonia  . Cancer (HCC)   Skin  . Chronic respiratory failure ([place])  . Cognitive communication deficit  . COPD (chronic obstructive pulmonary disease) ([place])  . Dementia  . Dementia  . Depression  . Diabetes mellitus without complication ([place])  . Diverticulosis  . Diverticulosis  . Fatty liver  . Fibromyalgia  . Fibromyalgia  . Gastric polyp 09/08/11  at the anastomosis-inflammatory . Generalized anxiety disorder  . Generalized anxiety disorder  . GERD (gastroesophageal reflux disease)  . GERD (gastroesophageal reflux disease)  . 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)  . Hypercholesteremia  . Hypertension  . Insomnia  . Internal hemorrhoids  . Muscle weakness  . Neuropathy  . On home O2   qhs . Osteoporosis  .  Rheumatoid arteritis  . Ulcer   stomach . Vitamin D deficiency  Past Surgical History: Past Surgical History: Procedure Laterality Date . ABDOMINAL HYSTERECTOMY   . ABDOMINAL SURGERY    removed patial stomach from ulcer . COLONOSCOPY  04-2001  internal hemorrhoids, panocolonic diverticulosis, and colon polyps  . UPPER GASTROINTESTINAL ENDOSCOPY   HPI: 84 y.o. female with medical history significant of rheumatoid arthritis, chronic respiratory failure, COPD, chronic kidney disease stage III who was recently discharged from the hospital after being treated for healthcare associated pneumonia and sent to nursing home.  Patient had apparently initially been doing well.  She developed a significant leukocytosis and chest x-ray was repeated.  There was concern for persistent pneumonia and she was started on a course of Augmentin.  Her course at the nursing home waxed and waned.  More recently, she became increasingly short of breath and was noted to be hypoxic.  She became somnolent  and lethargic.  She also had vomiting and diarrhea which occurred in the last 24 hours.  On arrival to the emergency room she was noted to have a mild fever at 100.7.  Chest x-ray indicated persistent pneumonia.  Urinalysis did not show any signs of infection.  Influenza test was negative.  Subjective: "Will I have to do this every morning before I can have breakfast?" Assessment / Plan / Recommendation CHL IP CLINICAL IMPRESSIONS 05/05/2017 Clinical Impression Pt presents with mild oral phase dysphagia likely due to dentures that are loose fitting which results in min oral residue of thins beneath the dentures post swallow. Swallow trigger is at the level of the valleculae, mildly reduced epiglottic deflection with only one episode of trace, flash penetration of thin liquids when Pt taking barium tablet with thins. No aspiration or significant pharyngeal residuals noted. Pt with mild lingual coating/residue, however initiates a spontaneous secondary swallow which clears. Recommend D3/mech soft with thin liquids with po medications whole with liquids or in puree and presented one at a time. Pt will need to rinse her dentures following po due to some retention of liquids noted post swallow. No further SLP services indicated at this time.  SLP Visit Diagnosis Dysphagia, oropharyngeal phase (R13.12) Attention and concentration deficit following -- Frontal lobe and executive function deficit following -- Impact on safety and function Mild aspiration risk   CHL IP TREATMENT RECOMMENDATION 05/05/2017 Treatment Recommendations No treatment recommended at this time   Prognosis 05/05/2017 Prognosis for Safe Diet Advancement Good Barriers to Reach Goals -- Barriers/Prognosis Comment -- CHL IP DIET RECOMMENDATION 05/05/2017 SLP Diet Recommendations Thin liquid;Dysphagia 3 (Mech soft) solids Liquid Administration via Cup;Straw Medication Administration Whole meds with liquid Compensations Slow rate;Small sips/bites;Multiple dry  swallows after each bite/sip Postural Changes Remain semi-upright after after feeds/meals (Comment);Seated upright at 90 degrees   CHL IP OTHER RECOMMENDATIONS 05/05/2017 Recommended Consults -- Oral Care Recommendations Oral care BID;Staff/trained caregiver to provide oral care Other Recommendations Clarify dietary restrictions   CHL IP FOLLOW UP RECOMMENDATIONS 05/05/2017 Follow up Recommendations 24 hour supervision/assistance   CHL IP FREQUENCY AND DURATION 05/04/2017 Speech Therapy Frequency (ACUTE ONLY) min 2x/week Treatment Duration 1 week      CHL IP ORAL PHASE 05/05/2017 Oral Phase Impaired Oral - Pudding Teaspoon -- Oral - Pudding Cup -- Oral - Honey Teaspoon -- Oral - Honey Cup -- Oral - Nectar Teaspoon -- Oral - Nectar Cup -- Oral - Nectar Straw -- Oral - Thin Teaspoon Lingual/palatal residue Oral - Thin Cup Lingual/palatal residue Oral - Thin Straw Lingual/palatal  residue Oral - Puree -- Oral - Mech Soft -- Oral - Regular Piecemeal swallowing;Delayed oral transit Oral - Multi-Consistency -- Oral - Pill -- Oral Phase - Comment min oral residuals after liquids likely due to retention beneath and around dentures  CHL IP PHARYNGEAL PHASE 05/05/2017 Pharyngeal Phase WFL;Impaired Pharyngeal- Pudding Teaspoon -- Pharyngeal -- Pharyngeal- Pudding Cup -- Pharyngeal -- Pharyngeal- Honey Teaspoon -- Pharyngeal -- Pharyngeal- Honey Cup -- Pharyngeal -- Pharyngeal- Nectar Teaspoon -- Pharyngeal -- Pharyngeal- Nectar Cup -- Pharyngeal -- Pharyngeal- Nectar Straw -- Pharyngeal -- Pharyngeal- Thin Teaspoon -- Pharyngeal -- Pharyngeal- Thin Cup Delayed swallow initiation-vallecula;Reduced epiglottic inversion Pharyngeal -- Pharyngeal- Thin Straw -- Pharyngeal -- Pharyngeal- Puree -- Pharyngeal -- Pharyngeal- Mechanical Soft -- Pharyngeal -- Pharyngeal- Regular -- Pharyngeal -- Pharyngeal- Multi-consistency -- Pharyngeal -- Pharyngeal- Pill Delayed swallow initiation-vallecula;Penetration/Aspiration during swallow Pharyngeal  Material enters airway, remains ABOVE vocal cords then ejected out Pharyngeal Comment Essentially WNL for age  CHL IP CERVICAL ESOPHAGEAL PHASE 05/05/2017 Cervical Esophageal Phase WFL Pudding Teaspoon -- Pudding Cup -- Honey Teaspoon -- Honey Cup -- Nectar Teaspoon -- Nectar Cup -- Nectar Straw -- Thin Teaspoon -- Thin Cup -- Thin Straw -- Puree -- Mechanical Soft -- Regular -- Multi-consistency -- Pill -- Cervical Esophageal Comment -- No flowsheet data found. Thank you, Genene Churn, [place] 05/05/2017, 1:27 PM               Assessment/Plan  SOB with Wheezing  COPD exacerbation Will get Chest Xray to rule out Pneumonia. Will start her on Prednisone Again. 40 Mg Taper. Also will get stat Labs with BMP and CBC Also giving Duo nebs BID and Q6 hours PRN  ? Diastolic CHF Patient has gained almost 7 lbs in past few days. And is now 207 lbs Are getting Chest Xray to evaluate for Pulmonary Congestion Will give her Lasix 40 mg one dose Then restart her on her home dose of lasix of 20 mg  Type 2 diabetes mellitus  On Tradjenta BS were running Less then 200 Will continue on sliding scale as restarting on Prednisone.  Atrial fibrillation Rate controlled on diltiazem On Eliquis  Rheumatoid arthritis, On Chronic Methotrexate Epigastric Pain Now resolved She is on Protonix. H/O Aspiration Pneumonia Getting Repeat Chest Xray She is on Dysphagia Diet. Dementia  Patient has not really made much progress with therapy She.is going to be LTC   Closed compression fracture of L1 lumbar vertebra  On Prn Percocet  Addendum Her Chest Xray did not show any New infiltrate. Partial clearing of right upper lobe infiltrate. Her white Count was normal also. Will Supplement Potassium as her Potassium was 2.8 Repeat BMP in AM   Family/ staff Communication:   Labs/tests ordered:   Total time spent in this patient care encounter was 45_ minutes; greater than 50% of  the visit spent counseling patient, reviewing records , Labs and coordinating care for problems addressed at this encounter.

## 2017-06-02 ENCOUNTER — Non-Acute Institutional Stay (SKILLED_NURSING_FACILITY): Payer: Medicare Other | Admitting: Internal Medicine

## 2017-06-02 ENCOUNTER — Encounter: Payer: Self-pay | Admitting: Internal Medicine

## 2017-06-02 ENCOUNTER — Encounter (HOSPITAL_COMMUNITY)
Admission: AD | Admit: 2017-06-02 | Discharge: 2017-06-02 | Disposition: A | Discharge status: Home / Self Care | Payer: Medicare Other | Source: Skilled Nursing Facility | Attending: Internal Medicine | Admitting: Internal Medicine

## 2017-06-02 DIAGNOSIS — J449 Chronic obstructive pulmonary disease, unspecified: Secondary | ICD-10-CM | POA: Diagnosis not present

## 2017-06-02 DIAGNOSIS — E876 Hypokalemia: Secondary | ICD-10-CM

## 2017-06-02 DIAGNOSIS — N183 Chronic kidney disease, stage 3 (moderate): Secondary | ICD-10-CM | POA: Diagnosis not present

## 2017-06-02 DIAGNOSIS — I482 Chronic atrial fibrillation, unspecified: Secondary | ICD-10-CM

## 2017-06-02 DIAGNOSIS — I5032 Chronic diastolic (congestive) heart failure: Secondary | ICD-10-CM

## 2017-06-02 LAB — BASIC METABOLIC PANEL
ANION GAP: 12 (ref 5–15)
BUN: 18 mg/dL (ref 6–20)
CHLORIDE: 100 mmol/L — AB (ref 101–111)
CO2: 28 mmol/L (ref 22–32)
Calcium: 9.3 mg/dL (ref 8.9–10.3)
Creatinine, Ser: 1.47 mg/dL — ABNORMAL HIGH (ref 0.44–1.00)
GFR calc Af Amer: 36 mL/min — ABNORMAL LOW (ref >60)
GFR, EST NON AFRICAN AMERICAN: 31 mL/min — AB (ref >60)
GLUCOSE: 277 mg/dL — AB (ref 65–99)
Potassium: 4.1 mmol/L (ref 3.5–5.1)
Sodium: 140 mmol/L (ref 135–145)

## 2017-06-02 NOTE — Progress Notes (Signed)
Location:   [facility] Room Number: 151/P Place of Service:  SNF [phone]) Provider:  Cory Roughen, MD  Patient Care Team: Celene Squibb, MD as PCP - General  Extended Emergency Contact Information Primary Emergency Contact: Nance,Sheila Address: [address]          [place], [place] [address] [place] Phone: [phone] Relation: Daughter Secondary Emergency Contact: Charlynne Pander, [place] [address] [place] Phone: [phone] Relation: Grandaughter  Code Status:  DNR Goals of care: Advanced Directive information Advanced Directives 06/02/2017  Does Patient Have a Medical Advance Directive? Yes  Type of Advance Directive Out of facility DNR (pink MOST or yellow form)  Does patient want to make changes to medical advance directive? No - Patient declined  Copy of [place] in Chart? No - copy requested  Would patient like information on creating a medical advance directive? No - Patient declined  Pre-existing out of facility DNR order (yellow form or pink MOST form) -     Chief complaint acute visit to follow-up respiratory issues  HPI:  Pt is a 84 y.o. female seen today for an acute visit for follow-up of wheezing and shortness of breath.  Patient has a history of COPD as well as atrial fibrillation rheumatoid arthritis fibromyalgia peripheral neuropathy hyperlipidemia peptic ulcer disease as well as anxiety.  He was recently hospitalized for multilobular pneumonia treated with IV antibiotics prednisone and bronchodilators.  Yesterday she was complaining of cough with productive sputum as well as shortness of breath.  She was also noted to be wheezing.  Chest x-ray was obtained which actually showed improvement partial clearing of the right upper lobe infiltrate he did not show any new infiltrate white count was normal. She has been started on a prednisone taper-she did  have labs done which did show a potassium of 2.8 her white count was normal.  Of note she is also on duo nebs routinely as well as a as needed order.  Today she appears to be doing better I do not note any wheezing vital signs are stable as well as O2 saturation she is not complaining of shortness of breath cough appears to be improved I do not she has guaifenesin as needed  Regards potassium this has been supplemented aggressively and potassium actually today has normalized at 4.1    Past Medical History:  Diagnosis Date  . Anemia   . Atrial fibrillation ([place])   . Back pain   . Bacterial pneumonia   . Cancer (HCC)    Skin   . Chronic respiratory failure ([place])   . Cognitive communication deficit   . COPD (chronic obstructive pulmonary disease) ([place])   . Dementia   . Dementia   . Depression   . Diabetes mellitus without complication ([place])   . Diverticulosis   . Diverticulosis   . Fatty liver   . Fibromyalgia   . Fibromyalgia   . Gastric polyp 09/08/11   at the anastomosis-inflammatory  . Generalized anxiety disorder   . Generalized anxiety disorder   . GERD (gastroesophageal reflux disease)   . GERD (gastroesophageal reflux disease)   . 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 Hypercholesteremia   . Hypertension   . Insomnia   . Internal hemorrhoids   . Muscle weakness   . Neuropathy   . On home O2    qhs  .  Osteoporosis   . Rheumatoid arteritis   . Ulcer    stomach  . Vitamin D deficiency    Past Surgical History:  Procedure Laterality Date  . ABDOMINAL HYSTERECTOMY    . ABDOMINAL SURGERY     removed patial stomach from ulcer  . COLONOSCOPY  04-2001   internal hemorrhoids, panocolonic diverticulosis, and colon polyps   . UPPER GASTROINTESTINAL ENDOSCOPY      Allergies  Allergen Reactions  . Other     Band Aids   . Tape   . Lipitor [Atorvastatin Calcium] Rash and Other (See Comments)    Muscle pain  . Niaspan [Niacin Er] Rash and Other (See  Comments)    Muscle pain    Outpatient Encounter Medications as of 06/02/2017  Medication Sig  . acetaminophen (TYLENOL) 325 MG tablet Take 650 mg by mouth every 8 (eight) hours as needed.  Marland Kitchen albuterol (PROAIR HFA) 108 (90 BASE) MCG/ACT inhaler Inhale 2 puffs into the lungs every 6 (six) hours as needed for wheezing or shortness of breath. For rescue   . apixaban (ELIQUIS) 2.5 MG TABS tablet Take 2.5 mg by mouth 2 (two) times daily.  Roseanne Kaufman [place]-Castor Oil (VENELEX) OINT Apply to sacrum and bilateral buttocks q shift and prn for prevention  . CARTIA XT 300 MG 24 hr capsule TAKE ONE CAPSULE BY MOUTH EVERY DAY  . donepezil (ARICEPT) 5 MG tablet Take 5 mg by mouth at bedtime.  . DULoxetine (CYMBALTA) 60 MG capsule Take 60 mg by mouth daily.    . furosemide (LASIX) 20 MG tablet Take 1 tablet (20 mg total) by mouth daily as needed for edema.  Marland Kitchen guaifenesin (ROBITUSSIN) 100 MG/5ML syrup Take 200 mg by mouth 4 (four) times daily as needed for cough.  Marland Kitchen HYDROcodone-acetaminophen (NORCO/VICODIN) 5-325 MG tablet Take 1 tablet by mouth every 8 (eight) hours as needed for moderate pain. Starting 04/24/2017  . insulin aspart (NOVOLOG FLEXPEN) 100 UNIT/ML FlexPen Give before meals and at bedtime  Per sliding scale  . ipratropium-albuterol (DUONEB) 0.5-2.5 (3) MG/3ML SOLN Take 3 mLs by nebulization every 6 (six) hours.  Marland Kitchen ipratropium-albuterol (DUONEB) 0.5-2.5 (3) MG/3ML SOLN Take 3 mLs by nebulization 2 (two) times daily as needed.  . linagliptin (TRADJENTA) 5 MG TABS tablet Take 1 tablet (5 mg total) by mouth daily.  Marland Kitchen LYRICA 100 MG capsule Take 100 mg by mouth 3 (three) times daily.  . methotrexate 2.5 MG tablet Take 12.5 mg by mouth. On Thursday at noon.  . Multiple Vitamin (DAILY VITE PO) Take 1 tablet by mouth daily.  . pantoprazole (PROTONIX) 40 MG tablet Take 1 tablet (40 mg total) by mouth daily.  . phenylephrine-shark liver oil-mineral oil-petrolatum (PREPARATION H) 0.25-3-14-71.9 % rectal  ointment Place 1 application rectally 2 (two) times daily as needed for hemorrhoids.  . polyethylene glycol (MIRALAX / GLYCOLAX) packet Take 17 g by mouth daily as needed for moderate constipation.  . potassium chloride SA (K-DUR,KLOR-CON) 20 MEQ tablet Take 2 tablets (40 mg ) by mouth twice a day for 3 days from 06/02/2017-06/04/2017. Then take 40 mg by mouth once a day starting 06/05/2017  . predniSONE (DELTASONE) 10 MG tablet Take 40 mg by mouth daily with breakfast. Take 40 mg by mouth once a day from 06/01/2017-06/02/2017. Then take 30 mg by mouth once a day from 06/03/2017-06/04/2017. Then take 20 mg by mouth once a day from 06/05/2017-06/06/2017. Then take 10 mg by mouth once a day from 06/07/2017-06/08/2017. Then take 5 mg  by mouth once a day from 06/09/2017.  . raloxifene (EVISTA) 60 MG tablet Take 60 mg by mouth daily.  . rosuvastatin (CRESTOR) 20 MG tablet Take 20 mg by mouth daily.   No facility-administered encounter medications on file as of 06/02/2017.     Review of Systems   In general she is not complaining of any fever or chills.  Skin does not complain of diaphoresis rashes or itching.  Head ears eyes nose mouth and throat is not complaining of sore throat or visual changes.  Respiratory is not complaining of wheezing or increased shortness of breath at this time occasionally has a cough which I suspect is somewhat chronic.  Cardiac is not complaining of chest pain has mild lower extremity edema.  GI does not complain of nausea vomiting diarrhea or constipation.  GU does not complain of dysuria.  Musculoskeletal at this point does not complain of joint pain does have some lower extremity weakness with a history of arthritis.  Neurologic is not complaining of dizziness headache or numbness.  And psych does not complain of being anxious or depressed appears to be more alert feeling better than when I saw her when she first came to facility  Immunization History  Administered  Date(s) Administered  . Tdap 10/21/2016   Pertinent  Health Maintenance Due  Topic Date Due  . INFLUENZA VACCINE  07/02/2017 (Originally 10/12/2016)  . FOOT EXAM  07/02/2017 (Originally 06/05/1938)  . OPHTHALMOLOGY EXAM  07/02/2017 (Originally 06/05/1938)  . URINE MICROALBUMIN  07/02/2017 (Originally 06/05/1938)  . DEXA SCAN  07/02/2017 (Originally 06/06/1995)  . PNA vac Low Risk Adult (1 of 2 - PCV13) 07/02/2017 (Originally 06/06/1995)  . HEMOGLOBIN A1C  09/30/2017   No flowsheet data found. Functional Status Survey:     Temperature is 97.8 pulse 78 respirations 20 blood pressure 122/70 O2 saturation is 98%.   Physical Exam   In general this is a pleasant elderly female in no distress she appears well-nourished.  Her skin is warm and dry.  Oropharynx clear mucous membranes moist.  Eyes sclera and conjunctive are clear visual acuity appears grossly intact.  Chest she has a small amount of rhonchi I could not appreciate any wheezing there is no labored breathing.  Heart is regular rate and rhythm without murmur gallop or rub-she has mild lower extremity edema.  Abdomen is soft non-tender with positive bowel sounds.  Musculoskeletal moves all extremities at baseline has lower extremity weakness largely ambulates in wheelchair.  Neurologic could not appreciate lateralizing findings her speech is clear cranial nerves appear grossly intact she is alert.  Psych she appears alert and oriented pleasant and appropriate  Labs reviewed: Recent Labs    03/31/17 1852  04/02/17 0627  04/10/17 0943  05/03/17 0914  05/05/17 0601 05/08/17 0400 06/01/17 1353  NA 142   < > 140   < >  --    < > 136   < > 139 139 136  K 3.5   < > 4.1   < >  --    < > 2.8*   < > 5.0 3.8 2.8*  CL 102   < > 102   < >  --    < > 93*   < > 106 98* 97*  CO2 29   < > 28   < >  --    < > 29   < > 24 31 28   GLUCOSE 165*   < > 241*   < >  --    < >  296*   < > 260* 177* 220*  BUN 17   < > 27*   < >  --    < > 22*    < > 14 22* 12  CREATININE 1.29*   < > 1.35*   < >  --    < > 1.60*   < > 1.16* 1.22* 1.40*  CALCIUM 9.7   < > 10.4*   < >  --    < > 9.3   < > 9.5 9.4 8.7*  MG 2.0  --  2.1  --  1.8  --  1.7  --   --   --   --   PHOS 3.0  --   --   --   --   --   --   --   --   --   --    < > = values in this interval not displayed.   Recent Labs    10/25/16 1924 03/31/17 1852 05/04/17 0452  AST 47* 30 21  ALT 39 17 14  ALKPHOS 75 70 62  BILITOT 0.6 0.7 0.5  PROT 6.4* 6.9 5.7*  ALBUMIN 3.1* 3.2* 2.6*   Recent Labs    04/20/17 0745  05/03/17 0914 05/04/17 0452 05/08/17 0400 06/01/17 1353  WBC 11.6*   < > 10.9* 9.4 7.2 7.8  NEUTROABS 9.2*  --  7.5  --   --  3.1  HGB 11.5*   < > 12.5 11.2* 11.7* 11.9*  HCT 38.4   < > 42.1 39.1 39.3 40.1  MCV 87.1   < > 89.8 91.1 88.3 87.7  PLT 182   < > 169 134* 116* 115*   < > = values in this interval not displayed.   Lab Results  Component Value Date   TSH 2.992 07/26/2016   Lab Results  Component Value Date   HGBA1C 7.6 (H) 04/02/2017   No results found for: CHOL, HDL, LDLCALC, LDLDIRECT, TRIG, CHOLHDL  Significant Diagnostic Results in last 30 days:  Dg Chest 2 View  Result Date: 06/01/2017 CLINICAL DATA:  Bilateral pneumonia.  Shortness of breath. EXAM: CHEST - 2 VIEW COMPARISON:  05/03/2017.  04/10/2017. FINDINGS: Mediastinum and hilar structures are normal. Stable cardiac persistent but improved right upper lobe. Improving bilateral interstitial prominence suggesting improving CHF. Small right pleural effusion again noted. No left pleural effusion on today's exam. No pneumothorax. IMPRESSION: 1. partial clearing of right upper lobe infiltrate. 2. Stable cardiomegaly. Partial clearing of bilateral interstitial prominence consistent with clearing interstitial edema. Electronically Signed   By: Marcello Moores  Register   On: 06/01/2017 13:55   Dg Swallowing Func-speech Pathology  Result Date: 05/05/2017 Objective Swallowing Evaluation: Type of Study:  MBS-Modified Barium Swallow Study  Patient Details Name: ASUSENA SIGLEY MRN: [id number] Date of Birth: 14-Sep-1928 Today's Date: 05/05/2017 Time: SLP Start Time (ACUTE ONLY): 0855 -SLP Stop Time (ACUTE ONLY): 0920 SLP Time Calculation (min) (ACUTE ONLY): 25 min Past Medical History: Past Medical History: Diagnosis Date . Anemia  . Atrial fibrillation ([place])  . Back pain  . Bacterial pneumonia  . Cancer (HCC)   Skin  . Chronic respiratory failure ([place])  . Cognitive communication deficit  . COPD (chronic obstructive pulmonary disease) ([place])  . Dementia  . Dementia  . Depression  . Diabetes mellitus without complication ([place])  . Diverticulosis  . Diverticulosis  . Fatty liver  . Fibromyalgia  . Fibromyalgia  . Gastric polyp 09/08/11  at the anastomosis-inflammatory . Generalized  anxiety disorder  . Generalized anxiety disorder  . GERD (gastroesophageal reflux disease)  . GERD (gastroesophageal reflux disease)  . 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)  . Hypercholesteremia  . Hypertension  . Insomnia  . Internal hemorrhoids  . Muscle weakness  . Neuropathy  . On home O2   qhs . Osteoporosis  . Rheumatoid arteritis  . Ulcer   stomach . Vitamin D deficiency  Past Surgical History: Past Surgical History: Procedure Laterality Date . ABDOMINAL HYSTERECTOMY   . ABDOMINAL SURGERY    removed patial stomach from ulcer . COLONOSCOPY  04-2001  internal hemorrhoids, panocolonic diverticulosis, and colon polyps  . UPPER GASTROINTESTINAL ENDOSCOPY   HPI: 84 y.o. female with medical history significant of rheumatoid arthritis, chronic respiratory failure, COPD, chronic kidney disease stage III who was recently discharged from the hospital after being treated for healthcare associated pneumonia and sent to nursing home.  Patient had apparently initially been doing well.  She developed a significant leukocytosis and chest x-ray was repeated.  There was concern for persistent pneumonia and she was started on a course of Augmentin.  Her  course at the nursing home waxed and waned.  More recently, she became increasingly short of breath and was noted to be hypoxic.  She became somnolent and lethargic.  She also had vomiting and diarrhea which occurred in the last 24 hours.  On arrival to the emergency room she was noted to have a mild fever at 100.7.  Chest x-ray indicated persistent pneumonia.  Urinalysis did not show any signs of infection.  Influenza test was negative.  Subjective: "Will I have to do this every morning before I can have breakfast?" Assessment / Plan / Recommendation CHL IP CLINICAL IMPRESSIONS 05/05/2017 Clinical Impression Pt presents with mild oral phase dysphagia likely due to dentures that are loose fitting which results in min oral residue of thins beneath the dentures post swallow. Swallow trigger is at the level of the valleculae, mildly reduced epiglottic deflection with only one episode of trace, flash penetration of thin liquids when Pt taking barium tablet with thins. No aspiration or significant pharyngeal residuals noted. Pt with mild lingual coating/residue, however initiates a spontaneous secondary swallow which clears. Recommend D3/mech soft with thin liquids with po medications whole with liquids or in puree and presented one at a time. Pt will need to rinse her dentures following po due to some retention of liquids noted post swallow. No further SLP services indicated at this time.  SLP Visit Diagnosis Dysphagia, oropharyngeal phase (R13.12) Attention and concentration deficit following -- Frontal lobe and executive function deficit following -- Impact on safety and function Mild aspiration risk   CHL IP TREATMENT RECOMMENDATION 05/05/2017 Treatment Recommendations No treatment recommended at this time   Prognosis 05/05/2017 Prognosis for Safe Diet Advancement Good Barriers to Reach Goals -- Barriers/Prognosis Comment -- CHL IP DIET RECOMMENDATION 05/05/2017 SLP Diet Recommendations Thin liquid;Dysphagia 3 (Mech  soft) solids Liquid Administration via Cup;Straw Medication Administration Whole meds with liquid Compensations Slow rate;Small sips/bites;Multiple dry swallows after each bite/sip Postural Changes Remain semi-upright after after feeds/meals (Comment);Seated upright at 90 degrees   CHL IP OTHER RECOMMENDATIONS 05/05/2017 Recommended Consults -- Oral Care Recommendations Oral care BID;Staff/trained caregiver to provide oral care Other Recommendations Clarify dietary restrictions   CHL IP FOLLOW UP RECOMMENDATIONS 05/05/2017 Follow up Recommendations 24 hour supervision/assistance   CHL IP FREQUENCY AND DURATION 05/04/2017 Speech Therapy Frequency (ACUTE ONLY) min 2x/week Treatment Duration 1 week      CHL IP ORAL PHASE  05/05/2017 Oral Phase Impaired Oral - Pudding Teaspoon -- Oral - Pudding Cup -- Oral - Honey Teaspoon -- Oral - Honey Cup -- Oral - Nectar Teaspoon -- Oral - Nectar Cup -- Oral - Nectar Straw -- Oral - Thin Teaspoon Lingual/palatal residue Oral - Thin Cup Lingual/palatal residue Oral - Thin Straw Lingual/palatal residue Oral - Puree -- Oral - Mech Soft -- Oral - Regular Piecemeal swallowing;Delayed oral transit Oral - Multi-Consistency -- Oral - Pill -- Oral Phase - Comment min oral residuals after liquids likely due to retention beneath and around dentures  CHL IP PHARYNGEAL PHASE 05/05/2017 Pharyngeal Phase WFL;Impaired Pharyngeal- Pudding Teaspoon -- Pharyngeal -- Pharyngeal- Pudding Cup -- Pharyngeal -- Pharyngeal- Honey Teaspoon -- Pharyngeal -- Pharyngeal- Honey Cup -- Pharyngeal -- Pharyngeal- Nectar Teaspoon -- Pharyngeal -- Pharyngeal- Nectar Cup -- Pharyngeal -- Pharyngeal- Nectar Straw -- Pharyngeal -- Pharyngeal- Thin Teaspoon -- Pharyngeal -- Pharyngeal- Thin Cup Delayed swallow initiation-vallecula;Reduced epiglottic inversion Pharyngeal -- Pharyngeal- Thin Straw -- Pharyngeal -- Pharyngeal- Puree -- Pharyngeal -- Pharyngeal- Mechanical Soft -- Pharyngeal -- Pharyngeal- Regular -- Pharyngeal  -- Pharyngeal- Multi-consistency -- Pharyngeal -- Pharyngeal- Pill Delayed swallow initiation-vallecula;Penetration/Aspiration during swallow Pharyngeal Material enters airway, remains ABOVE vocal cords then ejected out Pharyngeal Comment Essentially WNL for age  CHL IP CERVICAL ESOPHAGEAL PHASE 05/05/2017 Cervical Esophageal Phase WFL Pudding Teaspoon -- Pudding Cup -- Honey Teaspoon -- Honey Cup -- Nectar Teaspoon -- Nectar Cup -- Nectar Straw -- Thin Teaspoon -- Thin Cup -- Thin Straw -- Puree -- Mechanical Soft -- Regular -- Multi-consistency -- Pill -- Cervical Esophageal Comment -- No flowsheet data found. Thank you, Genene Churn, [place] 05/05/2017, 1:27 PM               Assessment/Plan   #1 history of COPD- with wheezing-this appears to be improved she is on routine duo nebs as well as a prednisone taper chest x-ray again was reassuring.  She also has guaifenesin as needed.  At this point will continue to monitor but this appears to be improved.  2.  Hypokalemia this has been supplemented aggressively and has normalized today at 4.1 will reduce her potassium to 20 mEq a day and recheck a BMP on Monday, March 25.  3.  Atrial fibrillation this actually appears to be rate controlled today she is on diltiazem on Eliquis for anticoagulation.  4.  History of dementia apparently she is not progressed well with therapy but she is bright and alert today appears to be feeling better she appears to be doing quite well with supportive care.  5.  History of diastolic CHF she has had some weight gain she did get an extra dose of Lasix 40 mg yesterday and now is on 20 mg of Lasix a day at this point clinically appears stable again chest x-ray was reassuring  [id number]-of note greater than 25 minutes spent assessing patient-reviewing her chart and labs- discussing her status with nursing- and coordinating and formulating plan of care- of note greater than 50% of time spent  coordinating plan of care with input as noted above

## 2017-06-04 ENCOUNTER — Encounter: Payer: Self-pay | Admitting: Internal Medicine

## 2017-06-05 ENCOUNTER — Encounter (HOSPITAL_COMMUNITY)
Admission: RE | Admit: 2017-06-05 | Discharge: 2017-06-05 | Disposition: A | Discharge status: Home / Self Care | Payer: Medicare Other | Source: Skilled Nursing Facility | Attending: *Deleted | Admitting: *Deleted

## 2017-06-05 DIAGNOSIS — N183 Chronic kidney disease, stage 3 (moderate): Secondary | ICD-10-CM | POA: Diagnosis not present

## 2017-06-05 LAB — BASIC METABOLIC PANEL
ANION GAP: 8 (ref 5–15)
BUN: 22 mg/dL — AB (ref 6–20)
CALCIUM: 9.5 mg/dL (ref 8.9–10.3)
CO2: 27 mmol/L (ref 22–32)
Chloride: 103 mmol/L (ref 101–111)
Creatinine, Ser: 1.26 mg/dL — ABNORMAL HIGH (ref 0.44–1.00)
GFR calc Af Amer: 43 mL/min — ABNORMAL LOW (ref >60)
GFR calc non Af Amer: 37 mL/min — ABNORMAL LOW (ref >60)
GLUCOSE: 241 mg/dL — AB (ref 65–99)
POTASSIUM: 4 mmol/L (ref 3.5–5.1)
SODIUM: 138 mmol/L (ref 135–145)

## 2017-06-06 ENCOUNTER — Non-Acute Institutional Stay (SKILLED_NURSING_FACILITY): Payer: Medicare Other | Admitting: Internal Medicine

## 2017-06-06 ENCOUNTER — Encounter: Payer: Self-pay | Admitting: Internal Medicine

## 2017-06-06 DIAGNOSIS — J449 Chronic obstructive pulmonary disease, unspecified: Secondary | ICD-10-CM | POA: Diagnosis not present

## 2017-06-06 DIAGNOSIS — I482 Chronic atrial fibrillation, unspecified: Secondary | ICD-10-CM

## 2017-06-06 DIAGNOSIS — M069 Rheumatoid arthritis, unspecified: Secondary | ICD-10-CM

## 2017-06-06 DIAGNOSIS — N183 Chronic kidney disease, stage 3 unspecified: Secondary | ICD-10-CM

## 2017-06-06 DIAGNOSIS — E876 Hypokalemia: Secondary | ICD-10-CM

## 2017-06-06 DIAGNOSIS — I5032 Chronic diastolic (congestive) heart failure: Secondary | ICD-10-CM

## 2017-06-06 NOTE — Progress Notes (Signed)
Location:   [facility] Room Number: 151/P Place of Service:  SNF (31) Provider:  Cory Roughen, MD  Patient Care Team: Celene Squibb, MD as PCP - General  Extended Emergency Contact Information Primary Emergency Contact: Nance,Sheila Address: [address]          [place], [place] [address] [place] Phone: [phone] Relation: Daughter Secondary Emergency Contact: Charlynne Pander, [place] [address] [place] Phone: [phone] Relation: Grandaughter  Code Status:  DNR Goals of care: Advanced Directive information Advanced Directives 06/06/2017  Does Patient Have a Medical Advance Directive? Yes  Type of Advance Directive Out of facility DNR (pink MOST or yellow form)  Does patient want to make changes to medical advance directive? No - Patient declined  Copy of Media in Chart? No - copy requested  Would patient like information on creating a medical advance directive? No - Patient declined  Pre-existing out of facility DNR order (yellow form or pink MOST form) -     Chief Complaint  Patient presents with  . Medical Management of Chronic Issues    Routine Visit  For medical management of chronic medical conditions including COPD-atrial fibrillation-history of rheumatoid arthritis as well as fibromyalgia-peripheral neuropathy-hyperlipidemia-history of peptic ulcer disease-dementia- hypokalemia-thrombocytopenia-type 2 diabetes-diastolic CHF-and history of compression fracture of L1- as well as chronic kidney disease.     HPI:  Pt is a 84 y.o. female seen today for medical management of chronic diseases.--As noted above.  Most recent acute issues have been largely respiratory related she was nauseous have some increased wheezing last week and has been started on a prednisone taper which she is completing she is also on duo nebs twice daily routinely and every 6 hours as  needed as well as Pro Air-she appears to be doing significantly better-she continues on chronic oxygen as well.  She does have a history of a previous hospitalization for pneumonia which was treated aggressively with antibiotics as well as prednisone.  She also has a history of diastolic CHF was noted to have had some weight gain of about 7 pounds recently and was started on Lasix low-dose-she is not really complaining of any shortness of breath this appears stabilized will order a reweight tomorrow to keep an eye on this.  Also was noted to have hypokalemia with a potassium of 2.8 last week this was supplemented aggressively she is currently on 20 mEq of potassium potassium was 4.0 on lab done yesterday will update that later this week as well.  In regards to dementia this appears to be fairly mild she is on low-dose Aricept.  She also has a history of type 2 diabetes on Tradjenta as well as NovoLog sliding scale blood sugars have been pretty well controlled lately she has had more elevated readings often in the 200s but she is on prednisone which I suspect is contributing to this at this point will monitor-- She also says she has had some dietary indiscretions which she is aware o.  Regards to her history of compression fracture of L1 she does receive Vicodin as needed she says this does help.  She also has a history of chronic thrombocytopenia which appears fairly stable on lab done last week platelet count was 115,000  Currently vital signs are stable she has no complaints says she is feeling relatively well   Past Medical History:  Diagnosis Date  . Anemia   . Atrial fibrillation ([place])   . Back pain   . Bacterial pneumonia   . Cancer (HCC)    Skin   . Chronic respiratory failure ([place])   . Cognitive communication deficit   . COPD (chronic obstructive pulmonary disease) ([place])   . Dementia   . Dementia   . Depression   . Diabetes mellitus without complication ([place])   .  Diverticulosis   . Diverticulosis   . Fatty liver   . Fibromyalgia   . Fibromyalgia   . Gastric polyp 09/08/11   at the anastomosis-inflammatory  . Generalized anxiety disorder   . Generalized anxiety disorder   . GERD (gastroesophageal reflux disease)   . GERD (gastroesophageal reflux disease)   . 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 Hypercholesteremia   . Hypertension   . Insomnia   . Internal hemorrhoids   . Muscle weakness   . Neuropathy   . On home O2    qhs  . Osteoporosis   . Rheumatoid arteritis   . Ulcer    stomach  . Vitamin D deficiency    Past Surgical History:  Procedure Laterality Date  . ABDOMINAL HYSTERECTOMY    . ABDOMINAL SURGERY     removed patial stomach from ulcer  . COLONOSCOPY  04-2001   internal hemorrhoids, panocolonic diverticulosis, and colon polyps   . UPPER GASTROINTESTINAL ENDOSCOPY      Allergies  Allergen Reactions  . Other     Band Aids   . Tape   . Lipitor [Atorvastatin Calcium] Rash and Other (See Comments)    Muscle pain  . Niaspan [Niacin Er] Rash and Other (See Comments)    Muscle pain    Outpatient Encounter Medications as of 06/06/2017  Medication Sig  . acetaminophen (TYLENOL) 325 MG tablet Take 650 mg by mouth every 8 (eight) hours as needed.  Marland Kitchen albuterol (PROAIR HFA) 108 (90 BASE) MCG/ACT inhaler Inhale 2 puffs into the lungs every 6 (six) hours as needed for wheezing or shortness of breath. For rescue   . apixaban (ELIQUIS) 2.5 MG TABS tablet Take 2.5 mg by mouth 2 (two) times daily.  Roseanne Kaufman Peru-Castor Oil (VENELEX) OINT Apply to sacrum and bilateral buttocks q shift and prn for prevention  . CARTIA XT 300 MG 24 hr capsule TAKE ONE CAPSULE BY MOUTH EVERY DAY  . donepezil (ARICEPT) 5 MG tablet Take 5 mg by mouth at bedtime.  . DULoxetine (CYMBALTA) 60 MG capsule Take 60 mg by mouth daily.    . furosemide (LASIX) 20 MG tablet Take 1 tablet (20 mg total) by mouth daily as needed for edema.  . furosemide (LASIX)  20 MG tablet Take 20 mg by mouth daily.  Marland Kitchen guaifenesin (ROBITUSSIN) 100 MG/5ML syrup Take 200 mg by mouth 4 (four) times daily as needed for cough.  Marland Kitchen HYDROcodone-acetaminophen (NORCO/VICODIN) 5-325 MG tablet Take 1 tablet by mouth every 8 (eight) hours as needed for moderate pain. Starting 04/24/2017  . insulin aspart (NOVOLOG FLEXPEN) 100 UNIT/ML FlexPen Give before meals and at bedtime  Per sliding scale  . ipratropium-albuterol (DUONEB) 0.5-2.5 (3) MG/3ML SOLN Take 3 mLs by nebulization every 6 (six) hours.  Marland Kitchen ipratropium-albuterol (DUONEB) 0.5-2.5 (3) MG/3ML SOLN Take 3 mLs by nebulization 2 (two) times daily as needed.  . linagliptin (TRADJENTA) 5 MG TABS tablet Take 1 tablet (5 mg total) by mouth daily.  Marland Kitchen LYRICA 100 MG capsule Take 100 mg by mouth 3 (three) times  daily.  . methotrexate 2.5 MG tablet Take 12.5 mg by mouth. On Thursday at noon.  . Multiple Vitamin (DAILY VITE PO) Take 1 tablet by mouth daily.  . pantoprazole (PROTONIX) 40 MG tablet Take 1 tablet (40 mg total) by mouth daily.  . phenylephrine-shark liver oil-mineral oil-petrolatum (PREPARATION H) 0.25-3-14-71.9 % rectal ointment Place 1 application rectally 2 (two) times daily as needed for hemorrhoids.  . polyethylene glycol (MIRALAX / GLYCOLAX) packet Take 17 g by mouth daily as needed for moderate constipation.  . potassium chloride SA (K-DUR,KLOR-CON) 20 MEQ tablet Take 2 tablets (40 mg ) by mouth twice a day for 3 days from 06/02/2017-06/04/2017. Then take 40 mg by mouth once a day starting 06/05/2017  . predniSONE (DELTASONE) 10 MG tablet Take 40 mg by mouth daily with breakfast. Take 40 mg by mouth once a day from 06/01/2017-06/02/2017. Then take 30 mg by mouth once a day from 06/03/2017-06/04/2017. Then take 20 mg by mouth once a day from 06/05/2017-06/06/2017. Then take 10 mg by mouth once a day from 06/07/2017-06/08/2017. Then take 5 mg by mouth once a day from 06/09/2017.  . raloxifene (EVISTA) 60 MG tablet Take 60 mg by mouth  daily.  . rosuvastatin (CRESTOR) 20 MG tablet Take 20 mg by mouth daily.   No facility-administered encounter medications on file as of 06/06/2017.      Review of Systems   In general she is not complaining of any fever chills she has gained some weight again she will be reweight.  Skin is not complain of rashes or itching.  Head ears eyes nose mouth and throat think she needs new glasses does not complain of acute visual changes apparently staff is working on this.  Does not complain of sore throat or nasal discharge.  Respiratory is not complaining of increased cough or shortness of breath does have a history of COPD is on oxygen.  Cardiac is not complaining of chest pain does have lower extremity edema which has some chronicity to it.  GI is not complaining of abdominal discomfort nausea vomiting diarrhea constipation.  GU is not complaining of dysuria.  Musculoskeletal complains at times of back pain but says the Vicodin is helping.  Neurologic does not complain of dizziness headache or numbness still has some lower extremity weakness.  Psych does not complain of being anxious or depressed at one point she was on a short course of Ativan but I do not see this ordered any longer Immunization History  Administered Date(s) Administered  . Tdap 10/21/2016   Pertinent  Health Maintenance Due  Topic Date Due  . INFLUENZA VACCINE  07/02/2017 (Originally 10/12/2016)  . FOOT EXAM  07/02/2017 (Originally 06/05/1938)  . OPHTHALMOLOGY EXAM  07/02/2017 (Originally 06/05/1938)  . URINE MICROALBUMIN  07/02/2017 (Originally 06/05/1938)  . DEXA SCAN  07/02/2017 (Originally 06/06/1995)  . PNA vac Low Risk Adult (1 of 2 - PCV13) 07/02/2017 (Originally 06/06/1995)  . HEMOGLOBIN A1C  09/30/2017   No flowsheet data found. Functional Status Survey:    Vitals:   06/06/17 1405  BP: 122/70  Pulse: 78  Resp: 20  Temp: 97.8 F (36.6 C)  TempSrc: Oral  SpO2: 93%  Weight: 207 lb 9.6 oz (94.2  kg)  Height: 5\' 3"  (1.6 m)   Body mass index is 36.77 kg/m. Physical Exam   In general this is a pleasant elderly female in no distress she appears well-nourished and in good spirits.  Her skin is warm and dry.  Eyes she  has prescription lenses sclera and conjunctive are clear pupils are reactive to light visual acuity appears grossly intact.  Oropharynx is clear mucous membranes moist.  Chest is largely clear to auscultation could not really appreciate any wheezing --no labored breathing she has very minimal amount of rhonchi somewhat reduced air entry--.  Heart is regular irregular rate and rhythm without murmur gallop or rub she has what appears to be moderate lower extremity edema bilaterally.  Her abdomen is obese soft nontender with positive bowel sounds.  Musculoskeletal moves all extremities at baseline x4 limited exam since she is in bed does have some lower extremity weakness.  Neurologic is grossly intact could not really appreciate lateralizing findings her speech is clear.  Psych she appears grossly alert and oriented I would classify her dementia is quite mild  Labs reviewed: Recent Labs    03/31/17 1852  04/02/17 0627  04/10/17 0943  05/03/17 0914  06/01/17 1353 06/02/17 1201 06/05/17 0041  NA 142   < > 140   < >  --    < > 136   < > 136 140 138  K 3.5   < > 4.1   < >  --    < > 2.8*   < > 2.8* 4.1 4.0  CL 102   < > 102   < >  --    < > 93*   < > 97* 100* 103  CO2 29   < > 28   < >  --    < > 29   < > 28 28 27   GLUCOSE 165*   < > 241*   < >  --    < > 296*   < > 220* 277* 241*  BUN 17   < > 27*   < >  --    < > 22*   < > 12 18 22*  CREATININE 1.29*   < > 1.35*   < >  --    < > 1.60*   < > 1.40* 1.47* 1.26*  CALCIUM 9.7   < > 10.4*   < >  --    < > 9.3   < > 8.7* 9.3 9.5  MG 2.0  --  2.1  --  1.8  --  1.7  --   --   --   --   PHOS 3.0  --   --   --   --   --   --   --   --   --   --    < > = values in this interval not displayed.   Recent Labs     10/25/16 1924 03/31/17 1852 05/04/17 0452  AST 47* 30 21  ALT 39 17 14  ALKPHOS 75 70 62  BILITOT 0.6 0.7 0.5  PROT 6.4* 6.9 5.7*  ALBUMIN 3.1* 3.2* 2.6*   Recent Labs    04/20/17 0745  05/03/17 0914 05/04/17 0452 05/08/17 0400 06/01/17 1353  WBC 11.6*   < > 10.9* 9.4 7.2 7.8  NEUTROABS 9.2*  --  7.5  --   --  3.1  HGB 11.5*   < > 12.5 11.2* 11.7* 11.9*  HCT 38.4   < > 42.1 39.1 39.3 40.1  MCV 87.1   < > 89.8 91.1 88.3 87.7  PLT 182   < > 169 134* 116* 115*   < > = values in this interval not displayed.   Lab Results  Component  Value Date   TSH 2.992 07/26/2016   Lab Results  Component Value Date   HGBA1C 7.6 (H) 04/02/2017   No results found for: CHOL, HDL, LDLCALC, LDLDIRECT, TRIG, CHOLHDL  Significant Diagnostic Results in last 30 days:  Dg Chest 2 View  Result Date: 06/01/2017 CLINICAL DATA:  Bilateral pneumonia.  Shortness of breath. EXAM: CHEST - 2 VIEW COMPARISON:  05/03/2017.  04/10/2017. FINDINGS: Mediastinum and hilar structures are normal. Stable cardiac persistent but improved right upper lobe. Improving bilateral interstitial prominence suggesting improving CHF. Small right pleural effusion again noted. No left pleural effusion on today's exam. No pneumothorax. IMPRESSION: 1. partial clearing of right upper lobe infiltrate. 2. Stable cardiomegaly. Partial clearing of bilateral interstitial prominence consistent with clearing interstitial edema. Electronically Signed   By: Marcello Moores  Register   On: 06/01/2017 13:55    Assessment/Plan  #1- history of COPD- again she is completing a prednisone taper x-ray done last week did not show any acute process actually showed improvement in infiltrate- she is also on duo nebs routinely twice daily and every 6 hours as needed as well as Guaifenesin prn and Pro-Air  2-history of atrial fibrillation this appears rate controlled on Cartia-is on Eliquis for anticoagulation.  3.-  History of type 2 diabetes again she does have  some elevated blood sugars last several days I suspect this may be due to the prednisone and some dietary indiscretions at this point will monitor continue NovoLog sliding scale as well as Tradjenta hemoglobin A1c was 7.6 in January suspect this may reflect somewhat questionable control at home.  4.-  History of diastolic CHF again she has been put on routine Lasix will reweight tomorrow notify provider if gain greater than 3 pounds- clinically she appears stable but still has edema.  -She is also potassium supplementation.  5.  History of hypokalemia-this has been supplemented aggressively currently on 20 mEq a day it did normalized yesterday labs showed a potassium of 4 will have this updated on Friday, March 29 to ensure stability.  6.  History of chronic kidney disease this appears stable with a creatinine of 1.26 on lab done yesterday again will update this later this week.  7.  History of L1 compression fracture pain appears to be relatively well controlled on the Vicodin as needed continue to monitor.  8.  History of dementia this appears to be mild she is on low-dose Aricept appears to be doing well with this.  9.  History of thrombocytopenia platelet count of 115,000 on lab done last week appears to be relatively baseline with recent values.  10.  History of peptic ulcer disease this appears relatively well controlled on Protonix.  11.  History of peripheral neuropathy she is on Lyrica as well as Cymbalta he does not complain of overt symptoms today at this point will monitor.  12.  History of rheumatoid arthritis continues on methotrexate q. weekly.  13.  History of fibromyalgia again she is on Cymbalta.  14.  History of hyperlipidemia she continues on Crestor.  Again will update a basic metabolic panel on Friday, March 29 to keep an eye on her renal function electrolytes with the Lasix initiation-she also will need to weight patient tomorrow notify provider of weight gain greater  than 3 pounds  \[id number]-of note greater than 35 minutes spent assessing patient-reviewing her chart labs- discussing her status with nursing staff- and coordinating and formulating a plan of care for numerous diagnoses- of note greater than 50% of time spent  coordinating plan of care

## 2017-06-09 ENCOUNTER — Encounter (HOSPITAL_COMMUNITY)
Admission: RE | Admit: 2017-06-09 | Discharge: 2017-06-09 | Disposition: A | Discharge status: Home / Self Care | Payer: Medicare Other | Source: Skilled Nursing Facility | Attending: Internal Medicine | Admitting: Internal Medicine

## 2017-06-09 DIAGNOSIS — J9611 Chronic respiratory failure with hypoxia: Secondary | ICD-10-CM | POA: Diagnosis not present

## 2017-06-09 DIAGNOSIS — J449 Chronic obstructive pulmonary disease, unspecified: Secondary | ICD-10-CM | POA: Diagnosis not present

## 2017-06-09 DIAGNOSIS — N183 Chronic kidney disease, stage 3 (moderate): Secondary | ICD-10-CM | POA: Diagnosis not present

## 2017-06-09 DIAGNOSIS — J189 Pneumonia, unspecified organism: Secondary | ICD-10-CM | POA: Diagnosis not present

## 2017-06-09 DIAGNOSIS — K219 Gastro-esophageal reflux disease without esophagitis: Secondary | ICD-10-CM | POA: Insufficient documentation

## 2017-06-09 DIAGNOSIS — M797 Fibromyalgia: Secondary | ICD-10-CM | POA: Diagnosis not present

## 2017-06-09 LAB — BASIC METABOLIC PANEL
ANION GAP: 11 (ref 5–15)
BUN: 24 mg/dL — ABNORMAL HIGH (ref 6–20)
CALCIUM: 9.1 mg/dL (ref 8.9–10.3)
CO2: 30 mmol/L (ref 22–32)
Chloride: 101 mmol/L (ref 101–111)
Creatinine, Ser: 1.07 mg/dL — ABNORMAL HIGH (ref 0.44–1.00)
GFR calc Af Amer: 53 mL/min — ABNORMAL LOW (ref >60)
GFR calc non Af Amer: 45 mL/min — ABNORMAL LOW (ref >60)
Glucose, Bld: 140 mg/dL — ABNORMAL HIGH (ref 65–99)
Potassium: 3.2 mmol/L — ABNORMAL LOW (ref 3.5–5.1)
SODIUM: 142 mmol/L (ref 135–145)

## 2017-06-12 ENCOUNTER — Encounter (HOSPITAL_COMMUNITY)
Admission: RE | Admit: 2017-06-12 | Discharge: 2017-06-12 | Disposition: A | Discharge status: Home / Self Care | Payer: Medicare Other | Source: Skilled Nursing Facility | Attending: Internal Medicine | Admitting: Internal Medicine

## 2017-06-12 DIAGNOSIS — K219 Gastro-esophageal reflux disease without esophagitis: Secondary | ICD-10-CM | POA: Diagnosis not present

## 2017-06-12 DIAGNOSIS — M797 Fibromyalgia: Secondary | ICD-10-CM | POA: Insufficient documentation

## 2017-06-12 DIAGNOSIS — J449 Chronic obstructive pulmonary disease, unspecified: Secondary | ICD-10-CM | POA: Diagnosis not present

## 2017-06-12 DIAGNOSIS — J189 Pneumonia, unspecified organism: Secondary | ICD-10-CM | POA: Insufficient documentation

## 2017-06-12 DIAGNOSIS — J9611 Chronic respiratory failure with hypoxia: Secondary | ICD-10-CM | POA: Insufficient documentation

## 2017-06-12 DIAGNOSIS — N183 Chronic kidney disease, stage 3 (moderate): Secondary | ICD-10-CM | POA: Diagnosis not present

## 2017-06-12 LAB — BASIC METABOLIC PANEL
Anion gap: 8 (ref 5–15)
BUN: 14 mg/dL (ref 6–20)
CALCIUM: 9.5 mg/dL (ref 8.9–10.3)
CO2: 30 mmol/L (ref 22–32)
CREATININE: 1.19 mg/dL — AB (ref 0.44–1.00)
Chloride: 102 mmol/L (ref 101–111)
GFR calc non Af Amer: 40 mL/min — ABNORMAL LOW (ref >60)
GFR, EST AFRICAN AMERICAN: 46 mL/min — AB (ref >60)
GLUCOSE: 171 mg/dL — AB (ref 65–99)
Potassium: 3.3 mmol/L — ABNORMAL LOW (ref 3.5–5.1)
Sodium: 140 mmol/L (ref 135–145)

## 2017-06-12 LAB — MAGNESIUM: Magnesium: 1.9 mg/dL (ref 1.7–2.4)

## 2017-06-15 ENCOUNTER — Inpatient Hospital Stay (HOSPITAL_COMMUNITY): Admission: RE | Admit: 2017-06-15 | Payer: Self-pay | Source: Skilled Nursing Facility

## 2017-06-16 ENCOUNTER — Encounter (HOSPITAL_COMMUNITY)
Admission: RE | Admit: 2017-06-16 | Discharge: 2017-06-16 | Disposition: A | Discharge status: Home / Self Care | Payer: Medicare Other | Source: Skilled Nursing Facility | Attending: Internal Medicine | Admitting: Internal Medicine

## 2017-06-16 DIAGNOSIS — K219 Gastro-esophageal reflux disease without esophagitis: Secondary | ICD-10-CM | POA: Insufficient documentation

## 2017-06-16 DIAGNOSIS — J9611 Chronic respiratory failure with hypoxia: Secondary | ICD-10-CM | POA: Diagnosis not present

## 2017-06-16 DIAGNOSIS — J189 Pneumonia, unspecified organism: Secondary | ICD-10-CM | POA: Diagnosis not present

## 2017-06-16 DIAGNOSIS — N183 Chronic kidney disease, stage 3 (moderate): Secondary | ICD-10-CM | POA: Diagnosis not present

## 2017-06-16 DIAGNOSIS — M797 Fibromyalgia: Secondary | ICD-10-CM | POA: Insufficient documentation

## 2017-06-16 DIAGNOSIS — J449 Chronic obstructive pulmonary disease, unspecified: Secondary | ICD-10-CM | POA: Insufficient documentation

## 2017-06-16 LAB — BASIC METABOLIC PANEL
Anion gap: 8 (ref 5–15)
BUN: 14 mg/dL (ref 6–20)
CO2: 30 mmol/L (ref 22–32)
Calcium: 9.6 mg/dL (ref 8.9–10.3)
Chloride: 102 mmol/L (ref 101–111)
Creatinine, Ser: 1.39 mg/dL — ABNORMAL HIGH (ref 0.44–1.00)
GFR calc Af Amer: 38 mL/min — ABNORMAL LOW (ref >60)
GFR, EST NON AFRICAN AMERICAN: 33 mL/min — AB (ref >60)
GLUCOSE: 191 mg/dL — AB (ref 65–99)
POTASSIUM: 3.8 mmol/L (ref 3.5–5.1)
SODIUM: 140 mmol/L (ref 135–145)

## 2017-06-19 ENCOUNTER — Encounter (HOSPITAL_COMMUNITY)
Admission: RE | Admit: 2017-06-19 | Discharge: 2017-06-19 | Disposition: A | Discharge status: Home / Self Care | Payer: Medicare Other | Source: Skilled Nursing Facility | Attending: *Deleted | Admitting: *Deleted

## 2017-06-19 DIAGNOSIS — N183 Chronic kidney disease, stage 3 (moderate): Secondary | ICD-10-CM | POA: Diagnosis not present

## 2017-06-19 DIAGNOSIS — J189 Pneumonia, unspecified organism: Secondary | ICD-10-CM | POA: Diagnosis not present

## 2017-06-19 DIAGNOSIS — K219 Gastro-esophageal reflux disease without esophagitis: Secondary | ICD-10-CM | POA: Diagnosis not present

## 2017-06-19 DIAGNOSIS — M797 Fibromyalgia: Secondary | ICD-10-CM | POA: Diagnosis not present

## 2017-06-19 DIAGNOSIS — J9611 Chronic respiratory failure with hypoxia: Secondary | ICD-10-CM | POA: Diagnosis not present

## 2017-06-19 DIAGNOSIS — J449 Chronic obstructive pulmonary disease, unspecified: Secondary | ICD-10-CM | POA: Diagnosis not present

## 2017-06-19 LAB — BASIC METABOLIC PANEL
Anion gap: 9 (ref 5–15)
BUN: 9 mg/dL (ref 6–20)
CHLORIDE: 103 mmol/L (ref 101–111)
CO2: 27 mmol/L (ref 22–32)
Calcium: 9.6 mg/dL (ref 8.9–10.3)
Creatinine, Ser: 1.1 mg/dL — ABNORMAL HIGH (ref 0.44–1.00)
GFR calc non Af Amer: 44 mL/min — ABNORMAL LOW (ref >60)
GFR, EST AFRICAN AMERICAN: 51 mL/min — AB (ref >60)
Glucose, Bld: 160 mg/dL — ABNORMAL HIGH (ref 65–99)
POTASSIUM: 3.9 mmol/L (ref 3.5–5.1)
SODIUM: 139 mmol/L (ref 135–145)

## 2017-06-26 ENCOUNTER — Other Ambulatory Visit: Payer: Self-pay

## 2017-06-26 MED ORDER — LYRICA 100 MG PO CAPS: 100.0000 mg | ORAL_CAPSULE | Freq: Three times a day (TID) | ORAL | 0 refills | Status: DC

## 2017-06-26 NOTE — Telephone Encounter (Signed)
RX Fax for [facility]@ [phone]  

## 2017-07-03 ENCOUNTER — Encounter: Payer: Self-pay | Admitting: Internal Medicine

## 2017-07-03 ENCOUNTER — Non-Acute Institutional Stay (SKILLED_NURSING_FACILITY): Payer: Medicare Other | Admitting: Internal Medicine

## 2017-07-03 DIAGNOSIS — I00 Rheumatic fever without heart involvement: Secondary | ICD-10-CM | POA: Diagnosis not present

## 2017-07-03 DIAGNOSIS — J449 Chronic obstructive pulmonary disease, unspecified: Secondary | ICD-10-CM

## 2017-07-03 DIAGNOSIS — I5032 Chronic diastolic (congestive) heart failure: Secondary | ICD-10-CM | POA: Diagnosis not present

## 2017-07-03 DIAGNOSIS — M0520 Rheumatoid vasculitis with rheumatoid arthritis of unspecified site: Secondary | ICD-10-CM

## 2017-07-03 DIAGNOSIS — I482 Chronic atrial fibrillation, unspecified: Secondary | ICD-10-CM

## 2017-07-03 DIAGNOSIS — K219 Gastro-esophageal reflux disease without esophagitis: Secondary | ICD-10-CM

## 2017-07-03 DIAGNOSIS — M069 Rheumatoid arthritis, unspecified: Secondary | ICD-10-CM

## 2017-07-03 DIAGNOSIS — F3341 Major depressive disorder, recurrent, in partial remission: Secondary | ICD-10-CM | POA: Diagnosis not present

## 2017-07-03 DIAGNOSIS — E1140 Type 2 diabetes mellitus with diabetic neuropathy, unspecified: Secondary | ICD-10-CM | POA: Diagnosis not present

## 2017-07-03 NOTE — Progress Notes (Signed)
Location:   [facility] Room Number: 151/P Place of Service:  SNF (31) Provider:  Darrold Span, MD  Patient Care Team: Celene Squibb, MD as PCP - General  Extended Emergency Contact Information Primary Emergency Contact: Nance,Sheila Address: [address]          [place], [place] [address] [place] Phone: [phone] Relation: Daughter Secondary Emergency Contact: Charlynne Pander,  [address] [place] Phone: [phone] Relation: Grandaughter  Code Status:  DNR  Goals of care: Advanced Directive information Advanced Directives 07/03/2017  Does Patient Have a Medical Advance Directive? Yes  Type of Advance Directive Out of facility DNR (pink MOST or yellow form)  Does patient want to make changes to medical advance directive? No - Patient declined  Copy of [place] in Chart? No - copy requested  Would patient like information on creating a medical advance directive? No - Patient declined  Pre-existing out of facility DNR order (yellow form or pink MOST form) -     Chief Complaint  Patient presents with  . Acute Visit    Patients c/o Per Daughter red rash on patients forehead, and request pain med or cream for knees    HPI:  Pt is a 84 y.o. female seen today for an acute visit for Rash on her Forehead and Pain in her Knees. Also Follow up of her chronic Problems.  Patient has h/o COPD, Chronic Atrial Fibrillation on Eliquis, Rheumatoid arthritis, Fibromyalgia, Peripheral Neuropathy, Hyperlipidemia, PUD, and anxiety disorder  Patient has requested to be seen today by her daughter as they had noticed red rash on her forehead with itching .  Patient also wanted to know if she can have topical treatment for her Knees.arthritis. Patient continues to have Chronic SOB and is on Oxygen 3.5 lit. She also has chronic dry Cough. She denies any Chest pain or fever or  chills. Patient is now Long term resident of facility.    Past Medical History:  Diagnosis Date  . Anemia   . Atrial fibrillation ([place])   . Back pain   . Bacterial pneumonia   . Cancer (HCC)    Skin   . Chronic respiratory failure ([place])   . Cognitive communication deficit   . COPD (chronic obstructive pulmonary disease) ([place])   . Dementia   . Dementia   . Depression   . Diabetes mellitus without complication ([place])   . Diverticulosis   . Diverticulosis   . Fatty liver   . Fibromyalgia   . Fibromyalgia   . Gastric polyp 09/08/11   at the anastomosis-inflammatory  . Generalized anxiety disorder   . Generalized anxiety disorder   . GERD (gastroesophageal reflux disease)   . GERD (gastroesophageal reflux disease)   . 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 Hypercholesteremia   . Hypertension   . Insomnia   . Internal hemorrhoids   . Muscle weakness   . Neuropathy   . On home O2    qhs  . Osteoporosis   . Rheumatoid arteritis   . Ulcer    stomach  . Vitamin D deficiency    Past Surgical History:  Procedure Laterality Date  . ABDOMINAL HYSTERECTOMY    . ABDOMINAL SURGERY     removed patial stomach from ulcer  . COLONOSCOPY  04-2001   internal hemorrhoids, panocolonic diverticulosis, and colon polyps   .  UPPER GASTROINTESTINAL ENDOSCOPY      Allergies  Allergen Reactions  . Other     Band Aids   . Tape   . Lipitor [Atorvastatin Calcium] Rash and Other (See Comments)    Muscle pain  . Niaspan [Niacin Er] Rash and Other (See Comments)    Muscle pain    Outpatient Encounter Medications as of 07/03/2017  Medication Sig  . acetaminophen (TYLENOL) 325 MG tablet Take 650 mg by mouth every 8 (eight) hours as needed.  Marland Kitchen albuterol (PROAIR HFA) 108 (90 BASE) MCG/ACT inhaler Inhale 2 puffs into the lungs every 6 (six) hours as needed for wheezing or shortness of breath. For rescue   . apixaban (ELIQUIS) 2.5 MG TABS tablet Take 2.5 mg by mouth 2 (two) times daily.    Roseanne Kaufman Peru-Castor Oil (VENELEX) OINT Apply to sacrum and bilateral buttocks q shift and prn for prevention  . CARTIA XT 300 MG 24 hr capsule TAKE ONE CAPSULE BY MOUTH EVERY DAY  . donepezil (ARICEPT) 5 MG tablet Take 5 mg by mouth at bedtime.  . DULoxetine (CYMBALTA) 60 MG capsule Take 60 mg by mouth daily.    . furosemide (LASIX) 20 MG tablet Take 1 tablet (20 mg total) by mouth daily as needed for edema.  . furosemide (LASIX) 20 MG tablet Take 20 mg by mouth daily.  Marland Kitchen guaifenesin (ROBITUSSIN) 100 MG/5ML syrup Take 200 mg by mouth 4 (four) times daily as needed for cough.  Marland Kitchen HYDROcodone-acetaminophen (NORCO/VICODIN) 5-325 MG tablet Take 1 tablet by mouth every 8 (eight) hours as needed for moderate pain. Starting 04/24/2017  . insulin aspart (NOVOLOG FLEXPEN) 100 UNIT/ML FlexPen Give before meals and at bedtime  Per sliding scale  . ipratropium-albuterol (DUONEB) 0.5-2.5 (3) MG/3ML SOLN Take 3 mLs by nebulization every 6 (six) hours.  Marland Kitchen ipratropium-albuterol (DUONEB) 0.5-2.5 (3) MG/3ML SOLN Take 3 mLs by nebulization 2 (two) times daily as needed.  . linagliptin (TRADJENTA) 5 MG TABS tablet Take 1 tablet (5 mg total) by mouth daily.  Marland Kitchen LYRICA 100 MG capsule Take 1 capsule (100 mg total) by mouth 3 (three) times daily.  . methotrexate 2.5 MG tablet Take 12.5 mg by mouth. On Thursday at noon.  . Multiple Vitamin (DAILY VITE PO) Take 1 tablet by mouth daily.  . pantoprazole (PROTONIX) 40 MG tablet Take 1 tablet (40 mg total) by mouth daily.  . phenylephrine-shark liver oil-mineral oil-petrolatum (PREPARATION H) 0.25-3-14-71.9 % rectal ointment Place 1 application rectally 2 (two) times daily as needed for hemorrhoids.  . polyethylene glycol (MIRALAX / GLYCOLAX) packet Take 17 g by mouth daily as needed for moderate constipation.  . potassium chloride SA (K-DUR,KLOR-CON) 20 MEQ tablet Take 2 tablets (40 mg ) by mouth twice a day for 3 days from 06/02/2017-06/04/2017. Then take 40 mg by mouth once a  day starting 06/05/2017  . raloxifene (EVISTA) 60 MG tablet Take 60 mg by mouth daily.  . rosuvastatin (CRESTOR) 20 MG tablet Take 20 mg by mouth daily.  . [DISCONTINUED] predniSONE (DELTASONE) 10 MG tablet Take 40 mg by mouth daily with breakfast. Take 40 mg by mouth once a day from 06/01/2017-06/02/2017. Then take 30 mg by mouth once a day from 06/03/2017-06/04/2017. Then take 20 mg by mouth once a day from 06/05/2017-06/06/2017. Then take 10 mg by mouth once a day from 06/07/2017-06/08/2017. Then take 5 mg by mouth once a day from 06/09/2017.   No facility-administered encounter medications on file as of 07/03/2017.  Review of Systems  Constitutional: Negative.   HENT: Negative.   Respiratory: Positive for cough and shortness of breath.   Cardiovascular: Negative.   Gastrointestinal: Negative.   Genitourinary: Negative.   Musculoskeletal: Positive for arthralgias and back pain.  Skin: Negative.   Neurological: Negative.   Psychiatric/Behavioral: Negative.     Immunization History  Administered Date(s) Administered  . Tdap 10/21/2016   Pertinent  Health Maintenance Due  Topic Date Due  . FOOT EXAM  08/02/2017 (Originally 06/05/1938)  . OPHTHALMOLOGY EXAM  08/02/2017 (Originally 06/05/1938)  . URINE MICROALBUMIN  08/02/2017 (Originally 06/05/1938)  . DEXA SCAN  08/02/2017 (Originally 06/06/1995)  . PNA vac Low Risk Adult (1 of 2 - PCV13) 08/02/2017 (Originally 06/06/1995)  . HEMOGLOBIN A1C  09/30/2017  . INFLUENZA VACCINE  10/12/2017   No flowsheet data found. Functional Status Survey:    Vitals:   07/03/17 1551  BP: 126/74  Pulse: 68  Resp: 18  Temp: (!) 97.5 F (36.4 C)   There is no height or weight on file to calculate BMI. Physical Exam  Constitutional: She appears well-developed and well-nourished.  HENT:  Head: Normocephalic.  Mouth/Throat: Oropharynx is clear and moist.  Eyes: Pupils are equal, round, and reactive to light.  Neck: Normal range of motion. Neck  supple.  Cardiovascular: Normal rate and regular rhythm.  Murmur heard. Pulmonary/Chest: Effort normal.  Occasional Expiratory Wheezing  Abdominal: Soft. Bowel sounds are normal. She exhibits no distension. There is no tenderness. There is no guarding.  Musculoskeletal:  Trace edema Bilateral  Lymphadenopathy:    She has no cervical adenopathy.  Neurological: She is alert.  Skin: Skin is warm and dry.  Psychiatric: She has a normal mood and affect. Her behavior is normal. Thought content normal.    Labs reviewed: Recent Labs    03/31/17 1852  04/10/17 0943  05/03/17 0914  06/12/17 0727 06/16/17 0729 06/19/17 0730  NA 142   < >  --    < > 136   < > 140 140 139  K 3.5   < >  --    < > 2.8*   < > 3.3* 3.8 3.9  CL 102   < >  --    < > 93*   < > 102 102 103  CO2 29   < >  --    < > 29   < > 30 30 27   GLUCOSE 165*   < >  --    < > 296*   < > 171* 191* 160*  BUN 17   < >  --    < > 22*   < > 14 14 9   CREATININE 1.29*   < >  --    < > 1.60*   < > 1.19* 1.39* 1.10*  CALCIUM 9.7   < >  --    < > 9.3   < > 9.5 9.6 9.6  MG 2.0   < > 1.8  --  1.7  --  1.9  --   --   PHOS 3.0  --   --   --   --   --   --   --   --    < > = values in this interval not displayed.   Recent Labs    10/25/16 1924 03/31/17 1852 05/04/17 0452  AST 47* 30 21  ALT 39 17 14  ALKPHOS 75 70 62  BILITOT 0.6 0.7 0.5  PROT 6.4* 6.9  5.7*  ALBUMIN 3.1* 3.2* 2.6*   Recent Labs    04/20/17 0745  05/03/17 0914 05/04/17 0452 05/08/17 0400 06/01/17 1353  WBC 11.6*   < > 10.9* 9.4 7.2 7.8  NEUTROABS 9.2*  --  7.5  --   --  3.1  HGB 11.5*   < > 12.5 11.2* 11.7* 11.9*  HCT 38.4   < > 42.1 39.1 39.3 40.1  MCV 87.1   < > 89.8 91.1 88.3 87.7  PLT 182   < > 169 134* 116* 115*   < > = values in this interval not displayed.   Lab Results  Component Value Date   TSH 2.992 07/26/2016   Lab Results  Component Value Date   HGBA1C 7.6 (H) 04/02/2017   No results found for: CHOL, HDL, LDLCALC, LDLDIRECT, TRIG,  CHOLHDL  Significant Diagnostic Results in last 30 days:  No results found.  Assessment/Plan   Diastolic CHF Patient has gained almost 5 lbs. Will increase her Lasix to 40 mg. This will help her SOB and Wheezing. Repeat BMP  Type 2 diabetes mellitus  On Tradjenta BS were running Less then 200 Will decrease Accu Checks to QD Discontinue sliding scale Repeat A1C COPD Continue on Nebs. It will be hard for her to take Inhalers. Will try to start her on Trilegy  Atrial fibrillation Rate controlled on diltiazem On Eliquis Will continue Low dose as patient has h/o Falls and very frail   Rheumatoid arthritis, On Chronic Methotrexate Per her Request will try some Topical Biofreeze Patient on Norco PRN Rash Seems  dermatitis Will start her on Lotrisone  Epigastric Pain Now resolved She is on Protonix. H/O Aspiration Pneumonia She is on Dysphagia Diet. Hyperlipidemia On Crestor Dementia Patient has not really made much progress with therapy She.is going to be LTC Continue Aricept Also On Cymbalta for Depression   Family/ staff Communication:   Labs/tests ordered:  BMP,CBC. A1C Total time spent in this patient care encounter was 45_ minutes; greater than 50% of the visit spent counseling patient, reviewing records , Labs and coordinating care for problems addressed at this encounter.

## 2017-07-04 DIAGNOSIS — H04123 Dry eye syndrome of bilateral lacrimal glands: Secondary | ICD-10-CM | POA: Diagnosis not present

## 2017-07-04 DIAGNOSIS — H43813 Vitreous degeneration, bilateral: Secondary | ICD-10-CM | POA: Diagnosis not present

## 2017-07-04 DIAGNOSIS — E113293 Type 2 diabetes mellitus with mild nonproliferative diabetic retinopathy without macular edema, bilateral: Secondary | ICD-10-CM | POA: Diagnosis not present

## 2017-07-04 DIAGNOSIS — Z794 Long term (current) use of insulin: Secondary | ICD-10-CM | POA: Diagnosis not present

## 2017-07-04 DIAGNOSIS — H524 Presbyopia: Secondary | ICD-10-CM | POA: Diagnosis not present

## 2017-07-04 LAB — HM DIABETES EYE EXAM

## 2017-07-11 ENCOUNTER — Encounter (HOSPITAL_COMMUNITY)
Admission: RE | Admit: 2017-07-11 | Discharge: 2017-07-11 | Disposition: A | Discharge status: Home / Self Care | Payer: Medicare Other | Source: Skilled Nursing Facility | Attending: Internal Medicine | Admitting: Internal Medicine

## 2017-07-11 DIAGNOSIS — E876 Hypokalemia: Secondary | ICD-10-CM | POA: Diagnosis not present

## 2017-07-11 DIAGNOSIS — I10 Essential (primary) hypertension: Secondary | ICD-10-CM | POA: Diagnosis not present

## 2017-07-11 DIAGNOSIS — G9009 Other idiopathic peripheral autonomic neuropathy: Secondary | ICD-10-CM | POA: Insufficient documentation

## 2017-07-11 DIAGNOSIS — F411 Generalized anxiety disorder: Secondary | ICD-10-CM | POA: Insufficient documentation

## 2017-07-11 DIAGNOSIS — F0390 Unspecified dementia without behavioral disturbance: Secondary | ICD-10-CM | POA: Insufficient documentation

## 2017-07-11 DIAGNOSIS — E118 Type 2 diabetes mellitus with unspecified complications: Secondary | ICD-10-CM | POA: Diagnosis not present

## 2017-07-11 DIAGNOSIS — J189 Pneumonia, unspecified organism: Secondary | ICD-10-CM | POA: Insufficient documentation

## 2017-07-11 LAB — CBC WITH DIFFERENTIAL/PLATELET
BASOS ABS: 0.1 10*3/uL (ref 0.0–0.1)
Basophils Relative: 1 %
EOS ABS: 0.2 10*3/uL (ref 0.0–0.7)
Eosinophils Relative: 4 %
HCT: 39.3 % (ref 36.0–46.0)
HEMOGLOBIN: 12.1 g/dL (ref 12.0–15.0)
LYMPHS PCT: 34 %
Lymphs Abs: 1.9 10*3/uL (ref 0.7–4.0)
MCH: 27.1 pg (ref 26.0–34.0)
MCHC: 30.8 g/dL (ref 30.0–36.0)
MCV: 87.9 fL (ref 78.0–100.0)
Monocytes Absolute: 0.4 10*3/uL (ref 0.1–1.0)
Monocytes Relative: 7 %
NEUTROS PCT: 54 %
Neutro Abs: 3.1 10*3/uL (ref 1.7–7.7)
Platelets: 127 10*3/uL — ABNORMAL LOW (ref 150–400)
RBC: 4.47 MIL/uL (ref 3.87–5.11)
RDW: 17 % — ABNORMAL HIGH (ref 11.5–15.5)
WBC: 5.6 10*3/uL (ref 4.0–10.5)

## 2017-07-11 LAB — HEMOGLOBIN A1C
HEMOGLOBIN A1C: 6.9 % — AB (ref 4.8–5.6)
Mean Plasma Glucose: 151.33 mg/dL

## 2017-07-11 LAB — MAGNESIUM: MAGNESIUM: 1.8 mg/dL (ref 1.7–2.4)

## 2017-07-14 ENCOUNTER — Other Ambulatory Visit: Payer: Self-pay

## 2017-07-14 MED ORDER — LYRICA 100 MG PO CAPS: 100.0000 mg | ORAL_CAPSULE | Freq: Three times a day (TID) | ORAL | 0 refills | Status: DC

## 2017-07-14 NOTE — Telephone Encounter (Signed)
RX Fax for [facility]@ [phone]  

## 2017-07-25 ENCOUNTER — Encounter: Payer: Self-pay | Admitting: Internal Medicine

## 2017-07-25 ENCOUNTER — Non-Acute Institutional Stay (SKILLED_NURSING_FACILITY): Payer: Medicare Other | Admitting: Internal Medicine

## 2017-07-25 DIAGNOSIS — I00 Rheumatic fever without heart involvement: Secondary | ICD-10-CM

## 2017-07-25 DIAGNOSIS — I10 Essential (primary) hypertension: Secondary | ICD-10-CM

## 2017-07-25 DIAGNOSIS — N183 Chronic kidney disease, stage 3 unspecified: Secondary | ICD-10-CM

## 2017-07-25 DIAGNOSIS — W19XXXD Unspecified fall, subsequent encounter: Secondary | ICD-10-CM | POA: Diagnosis not present

## 2017-07-25 DIAGNOSIS — M25512 Pain in left shoulder: Secondary | ICD-10-CM | POA: Diagnosis not present

## 2017-07-25 DIAGNOSIS — I482 Chronic atrial fibrillation, unspecified: Secondary | ICD-10-CM

## 2017-07-25 DIAGNOSIS — J449 Chronic obstructive pulmonary disease, unspecified: Secondary | ICD-10-CM | POA: Diagnosis not present

## 2017-07-25 DIAGNOSIS — E1140 Type 2 diabetes mellitus with diabetic neuropathy, unspecified: Secondary | ICD-10-CM

## 2017-07-25 DIAGNOSIS — I5032 Chronic diastolic (congestive) heart failure: Secondary | ICD-10-CM

## 2017-07-25 DIAGNOSIS — M069 Rheumatoid arthritis, unspecified: Secondary | ICD-10-CM | POA: Diagnosis not present

## 2017-07-25 DIAGNOSIS — G8929 Other chronic pain: Secondary | ICD-10-CM

## 2017-07-25 DIAGNOSIS — M0520 Rheumatoid vasculitis with rheumatoid arthritis of unspecified site: Secondary | ICD-10-CM

## 2017-07-25 DIAGNOSIS — F0390 Unspecified dementia without behavioral disturbance: Secondary | ICD-10-CM | POA: Diagnosis not present

## 2017-07-25 NOTE — Progress Notes (Signed)
Location:   [facility] Room Number: 152/W Place of Service:  SNF (31) Provider:  Darrold Span, MD  Patient Care Team: Celene Squibb, MD as PCP - General  Extended Emergency Contact Information Primary Emergency Contact: Nance,Sheila Address: [address]          [place], [place] [address] [place] Phone: [phone] Relation: Daughter Secondary Emergency Contact: Charlynne Pander, [place] [address] [place] Phone: [phone] Relation: Grandaughter  Code Status:  DNR Goals of care: Advanced Directive information Advanced Directives 07/25/2017  Does Patient Have a Medical Advance Directive? Yes  Type of Advance Directive Out of facility DNR (pink MOST or yellow form)  Does patient want to make changes to medical advance directive? No - Patient declined  Copy of [place] in Chart? No - copy requested  Would patient like information on creating a medical advance directive? No - Patient declined  Pre-existing out of facility DNR order (yellow form or pink MOST form) -     Chief Complaint  Patient presents with  . Acute Visit    Patients c/o F/U Fall    HPI:  Pt is a 84 y.o. female seen today for an acute visit for Follow up of her Fall.  Patient has h/o COPD, Chronic Atrial Fibrillation on Eliquis, Rheumatoid arthritis, Fibromyalgia, Peripheral Neuropathy, Hyperlipidemia, PUD, and anxiety disorder  Patient [place] from her Wheel chair yesterday. She did not sustain any injuries.  She says she does hurt in her Back and Left shoulder which is Chronic. She usually stays in the bed most of the time. She says her back hurts all the time and she doesn't like to get up. Patient cough is better and she has lost 7 lbs since her Lasix was increased to 40 mg . Her rash also is resolved on her Forehead. Her appetite stays good.     Past Medical History:  Diagnosis Date  .  Anemia   . Atrial fibrillation ([facility])   . Back pain   . Bacterial pneumonia   . Cancer (HCC)    Skin   . Chronic respiratory failure ([place])   . Cognitive communication deficit   . COPD (chronic obstructive pulmonary disease) ([place])   . Dementia   . Dementia   . Depression   . Diabetes mellitus without complication ([place])   . Diverticulosis   . Diverticulosis   . Fatty liver   . Fibromyalgia   . Fibromyalgia   . Gastric polyp 09/08/11   at the anastomosis-inflammatory  . Generalized anxiety disorder   . Generalized anxiety disorder   . GERD (gastroesophageal reflux disease)   . GERD (gastroesophageal reflux disease)   . 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 Hypercholesteremia   . Hypertension   . Insomnia   . Internal hemorrhoids   . Muscle weakness   . Neuropathy   . On home O2    qhs  . Osteoporosis   . Rheumatoid arteritis   . Ulcer    stomach  . Vitamin D deficiency    Past Surgical History:  Procedure Laterality Date  . ABDOMINAL HYSTERECTOMY    . ABDOMINAL SURGERY     removed patial stomach from ulcer  . COLONOSCOPY  04-2001   internal hemorrhoids, panocolonic diverticulosis, and colon polyps   . UPPER GASTROINTESTINAL ENDOSCOPY      Allergies  Allergen Reactions  .  Other     Band Aids   . Tape   . Lipitor [Atorvastatin Calcium] Rash and Other (See Comments)    Muscle pain  . Niaspan [Niacin Er] Rash and Other (See Comments)    Muscle pain    Outpatient Encounter Medications as of 07/25/2017  Medication Sig  . acetaminophen (TYLENOL) 325 MG tablet Take 650 mg by mouth every 8 (eight) hours as needed.  Marland Kitchen albuterol (PROAIR HFA) 108 (90 BASE) MCG/ACT inhaler Inhale 2 puffs into the lungs every 6 (six) hours as needed for wheezing or shortness of breath. For rescue   . apixaban (ELIQUIS) 2.5 MG TABS tablet Take 2.5 mg by mouth 2 (two) times daily.  Roseanne Kaufman [place]-Castor Oil (VENELEX) OINT Apply to sacrum and bilateral buttocks q shift and prn for  prevention  . benzonatate (TESSALON) 100 MG capsule Take 100 mg by mouth 3 (three) times daily as needed for cough.  . CARTIA XT 300 MG 24 hr capsule TAKE ONE CAPSULE BY MOUTH EVERY DAY  . clotrimazole-betamethasone (LOTRISONE) cream Apply 1 application topically daily. Apply to forehead  . donepezil (ARICEPT) 5 MG tablet Take 5 mg by mouth at bedtime.  . DULoxetine (CYMBALTA) 60 MG capsule Take 60 mg by mouth daily.    . Fluticasone-Umeclidin-Vilant (TRELEGY ELLIPTA) 100-62.5-25 MCG/INH AEPB One puff inhalation once a day  . furosemide (LASIX) 40 MG tablet Take 40 mg by mouth daily.  Marland Kitchen guaifenesin (ROBITUSSIN) 100 MG/5ML syrup Take 200 mg by mouth 4 (four) times daily as needed for cough.  Marland Kitchen HYDROcodone-acetaminophen (NORCO/VICODIN) 5-325 MG tablet Take 1 tablet by mouth every 8 (eight) hours as needed for moderate pain. Starting 04/24/2017  . ipratropium-albuterol (DUONEB) 0.5-2.5 (3) MG/3ML SOLN Take 3 mLs by nebulization every 6 (six) hours.  Marland Kitchen ipratropium-albuterol (DUONEB) 0.5-2.5 (3) MG/3ML SOLN Take 3 mLs by nebulization 2 (two) times daily as needed.  . linagliptin (TRADJENTA) 5 MG TABS tablet Take 1 tablet (5 mg total) by mouth daily.  . Menthol, Topical Analgesic, (BIOFREEZE) 4 % GEL Apply to both knees daily as needed  . methotrexate 2.5 MG tablet Take 12.5 mg by mouth. On Thursday at noon.  . Multiple Vitamin (DAILY VITE PO) Take 1 tablet by mouth daily.  . pantoprazole (PROTONIX) 40 MG tablet Take 1 tablet (40 mg total) by mouth daily.  . phenylephrine-shark liver oil-mineral oil-petrolatum (PREPARATION H) 0.25-3-14-71.9 % rectal ointment Place 1 application rectally 2 (two) times daily as needed for hemorrhoids.  . polyethylene glycol (MIRALAX / GLYCOLAX) packet Take 17 g by mouth daily as needed for moderate constipation.  . potassium chloride SA (K-DUR,KLOR-CON) 20 MEQ tablet Take 2 tablets (40 mg ) by mouth twice a day for 3 days from 06/02/2017-06/04/2017. Then take 40 mg by mouth  once a day starting 06/05/2017  . pregabalin (LYRICA) 100 MG capsule Take 100 mg by mouth 2 (two) times daily.  Marland Kitchen Propylene Glycol (SYSTANE BALANCE) 0.6 % SOLN Place 1 drop into both eyes 2 (two) times daily.  . raloxifene (EVISTA) 60 MG tablet Take 60 mg by mouth daily.  . rosuvastatin (CRESTOR) 20 MG tablet Take 20 mg by mouth daily.  . [DISCONTINUED] furosemide (LASIX) 20 MG tablet Take 1 tablet (20 mg total) by mouth daily as needed for edema. (Patient taking differently: Take 40 mg by mouth daily as needed for edema. )  . [DISCONTINUED] furosemide (LASIX) 20 MG tablet Take 20 mg by mouth daily.  . [DISCONTINUED] insulin aspart (NOVOLOG FLEXPEN) 100 UNIT/ML  FlexPen Give before meals and at bedtime  Per sliding scale  . [DISCONTINUED] LYRICA 100 MG capsule Take 1 capsule (100 mg total) by mouth 3 (three) times daily. (Patient taking differently: Take 100 mg by mouth 2 (two) times daily. )   No facility-administered encounter medications on file as of 07/25/2017.      Review of Systems  Constitutional: Negative.   HENT: Negative.   Respiratory: Negative.   Cardiovascular: Positive for leg swelling.  Gastrointestinal: Negative.   Genitourinary: Negative.   Musculoskeletal: Positive for arthralgias, back pain and myalgias.  Skin: Negative.   Neurological: Negative.   Psychiatric/Behavioral: Negative.     Immunization History  Administered Date(s) Administered  . Tdap 10/21/2016   Pertinent  Health Maintenance Due  Topic Date Due  . FOOT EXAM  08/02/2017 (Originally 06/05/1938)  . OPHTHALMOLOGY EXAM  08/02/2017 (Originally 06/05/1938)  . URINE MICROALBUMIN  08/02/2017 (Originally 06/05/1938)  . DEXA SCAN  08/02/2017 (Originally 06/06/1995)  . PNA vac Low Risk Adult (1 of 2 - PCV13) 08/02/2017 (Originally 06/06/1995)  . INFLUENZA VACCINE  10/12/2017  . HEMOGLOBIN A1C  01/10/2018   No flowsheet data found. Functional Status Survey:    Vitals:   07/25/17 1125  BP: 127/79  Pulse:  78  Resp: 19  Temp: 98.4 F (36.9 C)  TempSrc: Oral   There is no height or weight on file to calculate BMI. Physical Exam  Constitutional: She appears well-developed and well-nourished.  HENT:  Head: Normocephalic.  Mouth/Throat: Oropharynx is clear and moist.  Eyes: Pupils are equal, round, and reactive to light.  Neck: Neck supple.  Cardiovascular: Normal rate. An irregular rhythm present.  No murmur heard. Pulmonary/Chest: Effort normal.  Had Expiratory Wheezing  Abdominal: Soft. Bowel sounds are normal. She exhibits no distension. There is no tenderness. There is no guarding.  Musculoskeletal:  Mild edema Bilateral C/O Low back tenderness and Left shoulder Pain . But able to move all extremities with both Passive and active motion  Neurological: She is alert.  Moving all extremities  Skin: Skin is warm and dry.  Psychiatric: She has a normal mood and affect. Her behavior is normal. Thought content normal.    Labs reviewed: Recent Labs    03/31/17 1852  05/03/17 0914  06/12/17 0727 06/16/17 0729 06/19/17 0730 07/11/17 0714  NA 142   < > 136   < > 140 140 139  --   K 3.5   < > 2.8*   < > 3.3* 3.8 3.9  --   CL 102   < > 93*   < > 102 102 103  --   CO2 29   < > 29   < > 30 30 27   --   GLUCOSE 165*   < > 296*   < > 171* 191* 160*  --   BUN 17   < > 22*   < > 14 14 9   --   CREATININE 1.29*   < > 1.60*   < > 1.19* 1.39* 1.10*  --   CALCIUM 9.7   < > 9.3   < > 9.5 9.6 9.6  --   MG 2.0   < > 1.7  --  1.9  --   --  1.8  PHOS 3.0  --   --   --   --   --   --   --    < > = values in this interval not displayed.   Recent Labs  10/25/16 1924 03/31/17 1852 05/04/17 0452  AST 47* 30 21  ALT 39 17 14  ALKPHOS 75 70 62  BILITOT 0.6 0.7 0.5  PROT 6.4* 6.9 5.7*  ALBUMIN 3.1* 3.2* 2.6*   Recent Labs    05/03/17 0914  05/08/17 0400 06/01/17 1353 07/11/17 0714  WBC 10.9*   < > 7.2 7.8 5.6  NEUTROABS 7.5  --   --  3.1 3.1  HGB 12.5   < > 11.7* 11.9* 12.1  HCT 42.1    < > 39.3 40.1 39.3  MCV 89.8   < > 88.3 87.7 87.9  PLT 169   < > 116* 115* 127*   < > = values in this interval not displayed.   Lab Results  Component Value Date   TSH 2.992 07/26/2016   Lab Results  Component Value Date   HGBA1C 6.9 (H) 07/11/2017   No results found for: CHOL, HDL, LDLCALC, LDLDIRECT, TRIG, CHOLHDL  Significant Diagnostic Results in last 30 days:  No results found.  Assessment/Plan  S/P Fall. Patient continues ot be high Risk for fall D/W the therapy for Extra cushion in her chair. to prevent falls. Left Shoulder pain Will get Xray of the Left shoulder. Patient has had pain in this shoulder for many months . Will D/W Daughter about Ortho referral.  Atrial fibrillation, chronic  Rate Control on Cadiazem Also On eliquis  Chronic diastolic heart failure  Patient is doing well on Lasix 40 mg. Has lost almost 8 lbs. Her renal Function is stable Will Follow up with BMP.  Essential hypertension Controlled on Cartia  Rheumatoid arthritis D/W the Daughter about Methotrexate and follow up with Rheumatologist  COPD On Nebs BID And also On Trilegy    Type 2 diabetes mellitus with diabetic neuropathy, A1C was 6.9 in 04/19 BS less then 150 mostly On tradjenta  Dementia  Continue on Aricept  CKD (chronic kidney disease), stage III (HCC) Creat Stable  Pain Control Patient not asking for Percocet per Nurses.  Will try low dose in morning as standing dose and see if it helps with her Mobility.    Family/ staff Communication:   Labs/tests ordered:  BMP Total time spent in this patient care encounter was 45_ minutes; greater than 50% of the visit spent counseling patient, reviewing records , Labs and coordinating care for problems addressed at this encounter.

## 2017-07-26 ENCOUNTER — Encounter (HOSPITAL_COMMUNITY)
Admission: RE | Admit: 2017-07-26 | Discharge: 2017-07-26 | Disposition: A | Discharge status: Home / Self Care | Payer: Medicare Other | Source: Skilled Nursing Facility | Attending: Internal Medicine | Admitting: Internal Medicine

## 2017-07-26 ENCOUNTER — Ambulatory Visit (HOSPITAL_COMMUNITY): Payer: Medicare Other | Attending: Internal Medicine

## 2017-07-26 DIAGNOSIS — E876 Hypokalemia: Secondary | ICD-10-CM | POA: Diagnosis not present

## 2017-07-26 DIAGNOSIS — M25512 Pain in left shoulder: Secondary | ICD-10-CM | POA: Diagnosis not present

## 2017-07-26 DIAGNOSIS — F0390 Unspecified dementia without behavioral disturbance: Secondary | ICD-10-CM | POA: Insufficient documentation

## 2017-07-26 DIAGNOSIS — I10 Essential (primary) hypertension: Secondary | ICD-10-CM | POA: Insufficient documentation

## 2017-07-26 DIAGNOSIS — M19012 Primary osteoarthritis, left shoulder: Secondary | ICD-10-CM | POA: Insufficient documentation

## 2017-07-26 DIAGNOSIS — J189 Pneumonia, unspecified organism: Secondary | ICD-10-CM | POA: Insufficient documentation

## 2017-07-26 DIAGNOSIS — G8929 Other chronic pain: Secondary | ICD-10-CM | POA: Insufficient documentation

## 2017-07-26 DIAGNOSIS — F411 Generalized anxiety disorder: Secondary | ICD-10-CM | POA: Diagnosis not present

## 2017-07-26 DIAGNOSIS — G9009 Other idiopathic peripheral autonomic neuropathy: Secondary | ICD-10-CM | POA: Diagnosis not present

## 2017-07-26 LAB — BASIC METABOLIC PANEL
Anion gap: 6 (ref 5–15)
BUN: 14 mg/dL (ref 6–20)
CHLORIDE: 105 mmol/L (ref 101–111)
CO2: 28 mmol/L (ref 22–32)
CREATININE: 1.22 mg/dL — AB (ref 0.44–1.00)
Calcium: 9.4 mg/dL (ref 8.9–10.3)
GFR calc non Af Amer: 39 mL/min — ABNORMAL LOW (ref >60)
GFR, EST AFRICAN AMERICAN: 45 mL/min — AB (ref >60)
Glucose, Bld: 162 mg/dL — ABNORMAL HIGH (ref 65–99)
Potassium: 3.6 mmol/L (ref 3.5–5.1)
SODIUM: 139 mmol/L (ref 135–145)

## 2017-07-28 ENCOUNTER — Other Ambulatory Visit: Payer: Self-pay

## 2017-07-28 MED ORDER — HYDROCODONE-ACETAMINOPHEN 5-325 MG PO TABS: 1.0000 | ORAL_TABLET | Freq: Three times a day (TID) | ORAL | 0 refills | Status: DC | PRN

## 2017-07-28 NOTE — Telephone Encounter (Signed)
RX Fax for [facility]@ [phone]  

## 2017-08-08 ENCOUNTER — Other Ambulatory Visit: Payer: Self-pay

## 2017-08-08 MED ORDER — PREGABALIN 100 MG PO CAPS: 100.0000 mg | ORAL_CAPSULE | Freq: Two times a day (BID) | ORAL | 2 refills | Status: DC

## 2017-08-08 NOTE — Telephone Encounter (Signed)
This encounter was created in error - please disregard.

## 2017-08-08 NOTE — Telephone Encounter (Signed)
RX Fax for [facility]@ [phone]  

## 2017-08-15 ENCOUNTER — Non-Acute Institutional Stay (SKILLED_NURSING_FACILITY): Payer: Medicare Other

## 2017-08-15 DIAGNOSIS — Z0000 Encounter for general adult medical examination without abnormal findings: Secondary | ICD-10-CM

## 2017-08-15 NOTE — Patient Instructions (Signed)
[name] , Thank you for taking time to come for your Medicare Wellness Visit. I appreciate your ongoing commitment to your health goals. Please review the following plan we discussed and let me know if I can assist you in the future.   Screening recommendations/referrals: Colonoscopy excluded, over age 82 Mammogram excluded, over age 18 Bone Density due, ordered Recommended yearly ophthalmology/optometry visit for glaucoma screening and checkup Recommended yearly dental visit for hygiene and checkup  Vaccinations: Influenza vaccine up to date, due 2019 fall season Pneumococcal vaccine 13 due, ordered Tdap vaccine up to date, due 10/22/2026 Shingles vaccine not in past records    Advanced directives: in chart  Conditions/risks identified: none  Next appointment: Dr. Lyndel Safe makes rounds   Preventive Care 65 Years and Older, Female Preventive care refers to lifestyle choices and visits with your health care provider that can promote health and wellness. What does preventive care include?  A yearly physical exam. This is also called an annual well check.  Dental exams once or twice a year.  Routine eye exams. Ask your health care provider how often you should have your eyes checked.  Personal lifestyle choices, including:  Daily care of your teeth and gums.  Regular physical activity.  Eating a healthy diet.  Avoiding tobacco and drug use.  Limiting alcohol use.  Practicing safe sex.  Taking low-dose aspirin every day.  Taking vitamin and mineral supplements as recommended by your health care provider. What happens during an annual well check? The services and screenings done by your health care provider during your annual well check will depend on your age, overall health, lifestyle risk factors, and family history of disease. Counseling  Your health care provider may ask you questions about your:  Alcohol use.  Tobacco use.  Drug use.  Emotional  well-being.  Home and relationship well-being.  Sexual activity.  Eating habits.  History of falls.  Memory and ability to understand (cognition).  Work and work Statistician.  Reproductive health. Screening  You may have the following tests or measurements:  Height, weight, and BMI.  Blood pressure.  Lipid and cholesterol levels. These may be checked every 5 years, or more frequently if you are over 65 years old.  Skin check.  Lung cancer screening. You may have this screening every year starting at age 82 if you have a 30-pack-year history of smoking and currently smoke or have quit within the past 15 years.  Fecal occult blood test (FOBT) of the stool. You may have this test every year starting at age 58.  Flexible sigmoidoscopy or colonoscopy. You may have a sigmoidoscopy every 5 years or a colonoscopy every 10 years starting at age 53.  Hepatitis C blood test.  Hepatitis B blood test.  Sexually transmitted disease (STD) testing.  Diabetes screening. This is done by checking your blood sugar (glucose) after you have not eaten for a while (fasting). You may have this done every 1-3 years.  Bone density scan. This is done to screen for osteoporosis. You may have this done starting at age 18.  Mammogram. This may be done every 1-2 years. Talk to your health care provider about how often you should have regular mammograms. Talk with your health care provider about your test results, treatment options, and if necessary, the need for more tests. Vaccines  Your health care provider may recommend certain vaccines, such as:  Influenza vaccine. This is recommended every year.  Tetanus, diphtheria, and acellular pertussis (Tdap, Td) vaccine.  You may need a Td booster every 10 years.  Zoster vaccine. You may need this after age 22.  Pneumococcal 13-valent conjugate (PCV13) vaccine. One dose is recommended after age 76.  Pneumococcal polysaccharide (PPSV23) vaccine. One  dose is recommended after age 69. Talk to your health care provider about which screenings and vaccines you need and how often you need them. This information is not intended to replace advice given to you by your health care provider. Make sure you discuss any questions you have with your health care provider. Document Released: 03/27/2015 Document Revised: 11/18/2015 Document Reviewed: 12/30/2014 Elsevier Interactive Patient Education  2017 [place] Prevention in the Home Falls can cause injuries. They can happen to people of all ages. There are many things you can do to make your home safe and to help prevent falls. What can I do on the outside of my home?  Regularly fix the edges of walkways and driveways and fix any cracks.  Remove anything that might make you trip as you walk through a door, such as a raised step or threshold.  Trim any bushes or trees on the path to your home.  Use bright outdoor lighting.  Clear any walking paths of anything that might make someone trip, such as rocks or tools.  Regularly check to see if handrails are loose or broken. Make sure that both sides of any steps have handrails.  Any raised decks and porches should have guardrails on the edges.  Have any leaves, snow, or ice cleared regularly.  Use sand or salt on walking paths during winter.  Clean up any spills in your garage right away. This includes oil or grease spills. What can I do in the bathroom?  Use night lights.  Install grab bars by the toilet and in the tub and shower. Do not use towel bars as grab bars.  Use non-skid mats or decals in the tub or shower.  If you need to sit down in the shower, use a plastic, non-slip stool.  Keep the floor dry. Clean up any water that spills on the floor as soon as it happens.  Remove soap buildup in the tub or shower regularly.  Attach bath mats securely with double-sided non-slip rug tape.  Do not have throw rugs and other  things on the floor that can make you trip. What can I do in the bedroom?  Use night lights.  Make sure that you have a light by your bed that is easy to reach.  Do not use any sheets or blankets that are too big for your bed. They should not hang down onto the floor.  Have a firm chair that has side arms. You can use this for support while you get dressed.  Do not have throw rugs and other things on the floor that can make you trip. What can I do in the kitchen?  Clean up any spills right away.  Avoid walking on wet floors.  Keep items that you use a lot in easy-to-reach places.  If you need to reach something above you, use a strong step stool that has a grab bar.  Keep electrical cords out of the way.  Do not use floor polish or wax that makes floors slippery. If you must use wax, use non-skid floor wax.  Do not have throw rugs and other things on the floor that can make you trip. What can I do with my stairs?  Do not leave any  items on the stairs.  Make sure that there are handrails on both sides of the stairs and use them. Fix handrails that are broken or loose. Make sure that handrails are as long as the stairways.  Check any carpeting to make sure that it is firmly attached to the stairs. Fix any carpet that is loose or worn.  Avoid having throw rugs at the top or bottom of the stairs. If you do have throw rugs, attach them to the floor with carpet tape.  Make sure that you have a light switch at the top of the stairs and the bottom of the stairs. If you do not have them, ask someone to add them for you. What else can I do to help prevent falls?  Wear shoes that:  Do not have high heels.  Have rubber bottoms.  Are comfortable and fit you well.  Are closed at the toe. Do not wear sandals.  If you use a stepladder:  Make sure that it is fully opened. Do not climb a closed stepladder.  Make sure that both sides of the stepladder are locked into place.  Ask  someone to hold it for you, if possible.  Clearly mark and make sure that you can see:  Any grab bars or handrails.  First and last steps.  Where the edge of each step is.  Use tools that help you move around (mobility aids) if they are needed. These include:  Canes.  Walkers.  Scooters.  Crutches.  Turn on the lights when you go into a dark area. Replace any light bulbs as soon as they burn out.  Set up your furniture so you have a clear path. Avoid moving your furniture around.  If any of your floors are uneven, fix them.  If there are any pets around you, be aware of where they are.  Review your medicines with your doctor. Some medicines can make you feel dizzy. This can increase your chance of falling. Ask your doctor what other things that you can do to help prevent falls. This information is not intended to replace advice given to you by your health care provider. Make sure you discuss any questions you have with your health care provider. Document Released: 12/25/2008 Document Revised: 08/06/2015 Document Reviewed: 04/04/2014 Elsevier Interactive Patient Education  2017 [employer].

## 2017-08-15 NOTE — Progress Notes (Signed)
Subjective:   [name] is a 84 y.o. female who presents for Medicare Annual (Subsequent) preventive examination at [facility]  Last AWV-08/14/2014       Objective:     Vitals: BP 136/84 (BP Location: Left Arm, Patient Position: Supine)   Pulse 93   Temp 98.1 F (36.7 C) (Oral)   Ht 5\' 3"  (1.6 m)   Wt 207 lb (93.9 kg)   SpO2 98%   BMI 36.67 kg/m   Body mass index is 36.67 kg/m.  Advanced Directives 08/15/2017 07/25/2017 07/03/2017 06/06/2017 06/02/2017 06/01/2017 05/09/2017  Does Patient Have a Medical Advance Directive? Yes Yes Yes Yes Yes Yes Yes  Type of Advance Directive Out of facility DNR (pink MOST or yellow form) Out of facility DNR (pink MOST or yellow form) Out of facility DNR (pink MOST or yellow form) Out of facility DNR (pink MOST or yellow form) Out of facility DNR (pink MOST or yellow form) Out of facility DNR (pink MOST or yellow form) Out of facility DNR (pink MOST or yellow form)  Does patient want to make changes to medical advance directive? No - Patient declined No - Patient declined No - Patient declined No - Patient declined No - Patient declined No - Patient declined No - Patient declined  Copy of [place] in Chart? No - copy requested No - copy requested No - copy requested No - copy requested No - copy requested No - copy requested No - copy requested  Would patient like information on creating a medical advance directive? - No - Patient declined No - Patient declined No - Patient declined No - Patient declined No - Patient declined No - Patient declined  Pre-existing out of facility DNR order (yellow form or pink MOST form) Yellow form placed in chart (order not valid for inpatient use) - - - - - -    Tobacco Social History   Tobacco Use  Smoking Status Never Smoker  Smokeless Tobacco Never Used     Counseling given: Not Answered   Clinical Intake:  Pre-visit preparation completed: No  Pain :  0-10 Pain Score: 6  Pain Type: Acute pain Pain Location: Heel Pain Orientation: Right Pain Descriptors / Indicators: Sharp Pain Onset: Today Pain Frequency: Intermittent     Nutritional Risks: None Diabetes: Yes CBG done?: No Did pt. bring in CBG monitor from home?: No  How often do you need to have someone help you when you read instructions, pamphlets, or other written materials from your doctor or pharmacy?: 2 - Rarely  Interpreter Needed?: No  Information entered by :: Tyson Dense, RN  Past Medical History:  Diagnosis Date  . Anemia   . Atrial fibrillation ([place])   . Back pain   . Bacterial pneumonia   . Cancer (HCC)    Skin   . Chronic respiratory failure ([place])   . Cognitive communication deficit   . COPD (chronic obstructive pulmonary disease) ([place])   . Dementia   . Dementia   . Depression   . Diabetes mellitus without complication ([place])   . Diverticulosis   . Diverticulosis   . Fatty liver   . Fibromyalgia   . Fibromyalgia   . Gastric polyp 09/08/11   at the anastomosis-inflammatory  . Generalized anxiety disorder   . Generalized anxiety disorder   . GERD (gastroesophageal reflux disease)   . GERD (gastroesophageal reflux disease)   . 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  Hypercholesteremia   . Hypertension   . Insomnia   . Internal hemorrhoids   . Muscle weakness   . Neuropathy   . On home O2    qhs  . Osteoporosis   . Rheumatoid arteritis   . Ulcer    stomach  . Vitamin D deficiency    Past Surgical History:  Procedure Laterality Date  . ABDOMINAL HYSTERECTOMY    . ABDOMINAL SURGERY     removed patial stomach from ulcer  . COLONOSCOPY  04-2001   internal hemorrhoids, panocolonic diverticulosis, and colon polyps   . UPPER GASTROINTESTINAL ENDOSCOPY     Family History  Problem Relation Age of Onset  . Heart disease Father   . Kidney disease Mother        kidney cancer  . Colon cancer Daughter 55   Social History    Socioeconomic History  . Marital status: Widowed    Spouse name: Not on file  . Number of children: 6  . Years of education: Not on file  . Highest education level: Not on file  Occupational History  . Occupation: Retired  Scientific laboratory technician  . Financial resource strain: Not hard at all  . Food insecurity:    Worry: Never true    Inability: Never true  . Transportation needs:    Medical: No    Non-medical: No  Tobacco Use  . Smoking status: Never Smoker  . Smokeless tobacco: Never Used  Substance and Sexual Activity  . Alcohol use: No  . Drug use: No  . Sexual activity: Not on file  Lifestyle  . Physical activity:    Days per week: 0 days    Minutes per session: 0 min  . Stress: Not at all  Relationships  . Social connections:    Talks on phone: Three times a week    Gets together: Three times a week    Attends religious service: Never    Active member of club or organization: No    Attends meetings of clubs or organizations: Never    Relationship status: Widowed  Other Topics Concern  . Not on file  Social History Narrative   Daily caffeine     Outpatient Encounter Medications as of 08/15/2017  Medication Sig  . acetaminophen (TYLENOL) 325 MG tablet Take 650 mg by mouth every 8 (eight) hours as needed.  Marland Kitchen albuterol (PROAIR HFA) 108 (90 BASE) MCG/ACT inhaler Inhale 2 puffs into the lungs every 6 (six) hours as needed for wheezing or shortness of breath. For rescue   . apixaban (ELIQUIS) 2.5 MG TABS tablet Take 2.5 mg by mouth 2 (two) times daily.  Roseanne Kaufman [place]-Castor Oil (VENELEX) OINT Apply to sacrum and bilateral buttocks q shift and prn for prevention  . benzonatate (TESSALON) 100 MG capsule Take 100 mg by mouth 3 (three) times daily as needed for cough.  . CARTIA XT 300 MG 24 hr capsule TAKE ONE CAPSULE BY MOUTH EVERY DAY  . clotrimazole-betamethasone (LOTRISONE) cream Apply 1 application topically daily. Apply to forehead  . donepezil (ARICEPT) 5 MG tablet Take 5  mg by mouth at bedtime.  . DULoxetine (CYMBALTA) 60 MG capsule Take 60 mg by mouth daily.    . Fluticasone-Umeclidin-Vilant (TRELEGY ELLIPTA) 100-62.5-25 MCG/INH AEPB One puff inhalation once a day  . furosemide (LASIX) 40 MG tablet Take 40 mg by mouth daily.  Marland Kitchen guaifenesin (ROBITUSSIN) 100 MG/5ML syrup Take 200 mg by mouth 4 (four) times daily as needed for cough.  Marland Kitchen  HYDROcodone-acetaminophen (NORCO/VICODIN) 5-325 MG tablet Take 1 tablet by mouth every 8 (eight) hours as needed for moderate pain. Starting 04/24/2017  . ipratropium-albuterol (DUONEB) 0.5-2.5 (3) MG/3ML SOLN Take 3 mLs by nebulization every 6 (six) hours.  Marland Kitchen ipratropium-albuterol (DUONEB) 0.5-2.5 (3) MG/3ML SOLN Take 3 mLs by nebulization 2 (two) times daily as needed.  . linagliptin (TRADJENTA) 5 MG TABS tablet Take 1 tablet (5 mg total) by mouth daily.  . Menthol, Topical Analgesic, (BIOFREEZE) 4 % GEL Apply to both knees daily as needed  . methotrexate 2.5 MG tablet Take 12.5 mg by mouth. On Thursday at noon.  . Multiple Vitamin (DAILY VITE PO) Take 1 tablet by mouth daily.  . pantoprazole (PROTONIX) 40 MG tablet Take 1 tablet (40 mg total) by mouth daily.  . phenylephrine-shark liver oil-mineral oil-petrolatum (PREPARATION H) 0.25-3-14-71.9 % rectal ointment Place 1 application rectally 2 (two) times daily as needed for hemorrhoids.  . polyethylene glycol (MIRALAX / GLYCOLAX) packet Take 17 g by mouth daily as needed for moderate constipation.  . potassium chloride SA (K-DUR,KLOR-CON) 20 MEQ tablet Take 2 tablets (40 mg ) by mouth twice a day for 3 days from 06/02/2017-06/04/2017. Then take 40 mg by mouth once a day starting 06/05/2017  . pregabalin (LYRICA) 100 MG capsule Take 1 capsule (100 mg total) by mouth 2 (two) times daily.  Marland Kitchen Propylene Glycol (SYSTANE BALANCE) 0.6 % SOLN Place 1 drop into both eyes 2 (two) times daily.  . raloxifene (EVISTA) 60 MG tablet Take 60 mg by mouth daily.  . rosuvastatin (CRESTOR) 20 MG tablet  Take 20 mg by mouth daily.   No facility-administered encounter medications on file as of 08/15/2017.     Activities of Daily Living In your present state of health, do you have any difficulty performing the following activities: 08/15/2017 05/03/2017  Hearing? Tempie Donning  Vision? N N  Difficulty concentrating or making decisions? Tempie Donning  Walking or climbing stairs? Y N  Dressing or bathing? Y Y  Doing errands, shopping? Tempie Donning  Preparing Food and eating ? Y -  Using the Toilet? N -  In the past six months, have you accidently leaked urine? N -  Do you have problems with loss of bowel control? N -  Managing your Medications? Y -  Managing your Finances? Y -  Housekeeping or managing your Housekeeping? Y -  Some recent data might be hidden    Patient Care Team: Celene Squibb, MD as PCP - General    Assessment:   This is a routine wellness examination for Ambriana.  Exercise Activities and Dietary recommendations Current Exercise Habits: The patient does not participate in regular exercise at present, Exercise limited by: orthopedic condition(s);neurologic condition(s)  Goals    None      Fall Risk Fall Risk  08/15/2017  Falls in the past year? No   Is the patient's home free of loose throw rugs in walkways, pet beds, electrical cords, etc?   yes      Grab bars in the bathroom? yes      Handrails on the stairs?   yes      Adequate lighting?   yes  Depression Screen PHQ 2/9 Scores 08/15/2017  PHQ - 2 Score 0     Cognitive Function     6CIT Screen 08/15/2017  What Year? 4 points  What month? 3 points  What time? 0 points  Count back from 20 0 points  Months in reverse 4 points  Repeat phrase 6 points  Total Score 17    Immunization History  Administered Date(s) Administered  . Tdap 10/21/2016    Qualifies for Shingles Vaccine?  not in past records  Screening Tests Health Maintenance  Topic Date Due  . FOOT EXAM  06/05/1938  . URINE MICROALBUMIN  06/05/1938  . DEXA SCAN   06/06/1995  . PNA vac Low Risk Adult (1 of 2 - PCV13) 06/06/1995  . INFLUENZA VACCINE  10/12/2017  . HEMOGLOBIN A1C  01/10/2018  . OPHTHALMOLOGY EXAM  07/05/2018  . TETANUS/TDAP  10/22/2026    Cancer Screenings: Lung: Low Dose CT Chest recommended if Age 64-80 years, 30 pack-year currently smoking OR have quit w/in 15years. Patient does not qualify. Breast:  Up to date on Mammogram? Yes   Up to date of Bone Density/Dexa? No, ordered Colorectal: up to date  Additional Screenings:  Hepatitis C Screening: declined Prevnar due-ordered    Plan:  I have personally reviewed and addressed the Medicare Annual Wellness questionnaire and have noted the following in the patient's chart:  A. Medical and social history B. Use of alcohol, tobacco or illicit drugs  C. Current medications and supplements D. Functional ability and status E.  Nutritional status F.  Physical activity G. Advance directives H. List of other physicians I.  Hospitalizations, surgeries, and ER visits in previous 12 months J.  [place] to include hearing, vision, cognitive, depression L. Referrals and appointments - none  In addition, I have reviewed and discussed with patient certain preventive protocols, quality metrics, and best practice recommendations. A written personalized care plan for preventive services as well as general preventive health recommendations were provided to patient.  See attached scanned questionnaire for additional information.   Signed,   Tyson Dense, RN Nurse Health Advisor  Patient Concerns: R heel sharp pain starting today intermittently

## 2017-08-17 ENCOUNTER — Encounter: Payer: Self-pay | Admitting: Internal Medicine

## 2017-08-17 ENCOUNTER — Non-Acute Institutional Stay (SKILLED_NURSING_FACILITY): Payer: Medicare Other | Admitting: Internal Medicine

## 2017-08-17 DIAGNOSIS — I482 Chronic atrial fibrillation, unspecified: Secondary | ICD-10-CM

## 2017-08-17 DIAGNOSIS — N183 Chronic kidney disease, stage 3 unspecified: Secondary | ICD-10-CM

## 2017-08-17 DIAGNOSIS — G629 Polyneuropathy, unspecified: Secondary | ICD-10-CM | POA: Diagnosis not present

## 2017-08-17 DIAGNOSIS — G8929 Other chronic pain: Secondary | ICD-10-CM | POA: Diagnosis not present

## 2017-08-17 DIAGNOSIS — E1140 Type 2 diabetes mellitus with diabetic neuropathy, unspecified: Secondary | ICD-10-CM | POA: Diagnosis not present

## 2017-08-17 DIAGNOSIS — J449 Chronic obstructive pulmonary disease, unspecified: Secondary | ICD-10-CM | POA: Diagnosis not present

## 2017-08-17 DIAGNOSIS — M25512 Pain in left shoulder: Secondary | ICD-10-CM | POA: Diagnosis not present

## 2017-08-17 DIAGNOSIS — I5032 Chronic diastolic (congestive) heart failure: Secondary | ICD-10-CM | POA: Diagnosis not present

## 2017-08-17 NOTE — Progress Notes (Signed)
This is a routine visit.  Level care skilled.  Facility is [facility].  Chief complaint-routine visit for medical management of chronic medical conditions including COPD- fibrillation-rheumatoid arthritis- peripheral neuropathy- diastolic CHF-hypertension- chronic kidney disease- Dementia- type 2 diabetes   History of present illness.  Patient is a very pleasant 84 year old female who is a long-term resident of facility seen today for medical management of chronic medical conditions as noted above.  She appears to be having a period of stability- she does have significant COPD and is oxygen dependent she is also on duo nebs twice a day as well as Trelegy Ellipta and Pro Air-she appears to be doing well with this she is not really complaining of shortness of breath today she does have somewhat of a chronic cough.  I could not appreciate any wheezing  She does have a history of atrial fibrillation which appeared to be controlled on Cardizem she is on Eliquis for anticoagulation   regards to diastolic CHF at one point was putting on significant amount of weight her Lasix was increased she appears to have tolerated the 40 mg-weight is stabilized to slightly decreasing  She also has a history of chronic kidney disease which appears relatively stable with a creatinine of 1.22 on lab done in mid May will update this.  In regards to dementia this appears to be moderate she is doing well with supportive care appears she has a good appetite she pretty much ate everything at lunch today.  She is on low-dose Aricept.  Regards to type 2 diabetes she is on Tradjenta hemoglobin A1c showed improvement in April at 6.9- her blood sugars appear to run largely in the lower to mid 100s.  She also has history of pain issues with some chronic left shoulder pain-x-rays have been negative for any acute process she does receive hydrocodone routinely in the morning- this is complicated with a history of  rheumatoid arthritis for which she receives methotrexate weekly.  She also is on Lyrica twice a day for peripheral neuropathy I suspect with some diabetic related issues.   does have a history of depression but appears to be in good spirits and she is on Cymbalta   Currently she is resting in bed comfortably she is bright and alert pleasant somewhat confused at times but largely appropriate-   Past Medical History:  Diagnosis Date  . Anemia   . Atrial fibrillation ([place])   . Back pain   . Bacterial pneumonia   . Cancer (HCC)    Skin   . Chronic respiratory failure ([place])   . Cognitive communication deficit   . COPD (chronic obstructive pulmonary disease) ([place])   . Dementia   . Dementia   . Depression   . Diabetes mellitus without complication ([place])   . Diverticulosis   . Diverticulosis   . Fatty liver   . Fibromyalgia   . Fibromyalgia   . Gastric polyp 09/08/11   at the anastomosis-inflammatory  . Generalized anxiety disorder   . Generalized anxiety disorder   . GERD (gastroesophageal reflux disease)   . GERD (gastroesophageal reflux disease)   . 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 Hypercholesteremia   . Hypertension   . Insomnia   . Internal hemorrhoids   . Muscle weakness   . Neuropathy   . On home O2    qhs  . Osteoporosis   . Rheumatoid arteritis   . Ulcer    stomach  . Vitamin D deficiency  Past Surgical History:  Procedure Laterality Date  . ABDOMINAL HYSTERECTOMY    . ABDOMINAL SURGERY     removed patial stomach from ulcer  . COLONOSCOPY  04-2001   internal hemorrhoids, panocolonic diverticulosis, and colon polyps   . UPPER GASTROINTESTINAL ENDOSCOPY           Allergies  Allergen Reactions  . Other     Band Aids   . Tape   . Lipitor [Atorvastatin Calcium] Rash and Other (See Comments)    Muscle pain  . Niaspan [Niacin Er] Rash and Other (See Comments)    Muscle pain       MEDICATIONS     Medication Sig  . acetaminophen (TYLENOL) 325 MG tablet Take 650 mg by mouth every 8 (eight) hours as needed.  Marland Kitchen albuterol (PROAIR HFA) 108 (90 BASE) MCG/ACT inhaler Inhale 2 puffs into the lungs every 6 (six) hours as needed for wheezing or shortness of breath. For rescue   . apixaban (ELIQUIS) 2.5 MG TABS tablet Take 2.5 mg by mouth 2 (two) times daily.  Roseanne Kaufman Peru-Castor Oil (VENELEX) OINT Apply to sacrum and bilateral buttocks q shift and prn for prevention  . benzonatate (TESSALON) 100 MG capsule Take 100 mg by mouth 3 (three) times daily as needed for cough.  . CARTIA XT 300 MG 24 hr capsule TAKE ONE CAPSULE BY MOUTH EVERY DAY  . clotrimazole-betamethasone (LOTRISONE) cream Apply 1 application topically daily. Apply to forehead  . donepezil (ARICEPT) 5 MG tablet Take 5 mg by mouth at bedtime.  . DULoxetine (CYMBALTA) 60 MG capsule Take 60 mg by mouth daily.    . Fluticasone-Umeclidin-Vilant (TRELEGY ELLIPTA) 100-62.5-25 MCG/INH AEPB One puff inhalation once a day  . furosemide (LASIX) 40 MG tablet Take 40 mg by mouth daily.  Marland Kitchen guaifenesin (ROBITUSSIN) 100 MG/5ML syrup Take 200 mg by mouth 4 (four) times daily as needed for cough.  Marland Kitchen HYDROcodone-acetaminophen (NORCO/VICODIN) 5-325 MG tablet Take 1 tablet by mouth every 8 (eight) hours as needed for moderate pain. Starting 04/24/2017  . ipratropium-albuterol (DUONEB) 0.5-2.5 (3) MG/3ML SOLN Take 3 mLs by nebulization every 6 (six) hours.  Marland Kitchen ipratropium-albuterol (DUONEB) 0.5-2.5 (3) MG/3ML SOLN Take 3 mLs by nebulization 2 (two) times daily  . linagliptin (TRADJENTA) 5 MG TABS tablet Take 1 tablet (5 mg total) by mouth daily.  . Menthol, Topical Analgesic, (BIOFREEZE) 4 % GEL Apply to both knees daily as needed  . methotrexate 2.5 MG tablet Take 12.5 mg by mouth. On Thursday at noon.  . Multiple Vitamin (DAILY VITE PO) Take 1 tablet by mouth daily.  . pantoprazole (PROTONIX) 40 MG tablet Take 1 tablet (40 mg  total) by mouth daily.  . phenylephrine-shark liver oil-mineral oil-petrolatum (PREPARATION H) 0.25-3-14-71.9 % rectal ointment Place 1 application rectally 2 (two) times daily as needed for hemorrhoids.  . polyethylene glycol (MIRALAX / GLYCOLAX) packet Take 17 g by mouth daily as needed for moderate constipation.  . potassium chloride SA (K-DUR,KLOR-CON) 20 MEQ tablet Take 2 tablets (40 mg ) by mouth twice a day for 3 days from 06/02/2017-06/04/2017. Then take 40 mg by mouth once a day starting 06/05/2017  . pregabalin (LYRICA) 100 MG capsule Take 100 mg by mouth 2 (two) times daily.  Marland Kitchen Propylene Glycol (SYSTANE BALANCE) 0.6 % SOLN Place 1 drop into both eyes 2 (two) times daily.  . raloxifene (EVISTA) 60 MG tablet Take 60 mg by mouth daily.  . rosuvastatin (CRESTOR) 20 MG tablet Take 20 mg by  mouth daily.  . [DISCONTINUED] furosemide (LASIX) 20 MG tablet Take 1 tablet (20 mg total) by mouth daily as needed for edema. (Patient taking differently: Take 40 mg by mouth daily as needed for edema. )  . [DISCONTINUED] furosemide (LASIX) 20 MG tablet Take 20 mg by mouth daily.  . [DISCONTINUED] insulin aspart (NOVOLOG FLEXPEN) 100 UNIT/ML FlexPen Give before meals and at bedtime  Per sliding scale  . [DISCONTINUED] LYRICA 100 MG capsule Take 1 capsule (100 mg total) by mouth 3 (three) times daily. (Patient taking differently: Take 100 mg by mouth 2 (two) times daily. )      Review of systems.  In general she is not complaining of fever chills  has had some mild weight loss I suspect this may be fluid related  Skin does not complain of rashes or itching.  Head ears eyes nose mouth and throat is not complaining of visual changes or sore throat  Respiratory is chronic oxygen dependent does not complain of shortness of breath today has somewhat of an occasional chronic cough   Cardiac is not complaining of chest pain has baseline lower extremity edema  GI-is not complaining of abdominal pain nausea  vomiting diarrhea or constipation.  GU does not complain of dysuria.  Musculoskeletal at times will complain of knee pain and chronic left shoulder pain  Neurologic is not complaining of dizziness headache numbness or syncope at this time.  In psych appears to be feeling well in good spirits does have a history of depression but this appears to be controlled   Physical exam.  She is afebrile pulse is 80 respirations 16- blood pressure is 108/60 manually weight is 196.2  In general this is a pleasant well-nourished elderly female in no distress lying comfortably in bed.  Her skin is warm and dry.  Eyes sclera conjunctive are clear visual acuity appears to be intact pupils appear to be reactive.  Oropharynx is clear mucous membranes moist.  Heart is irregular irregular rate and rhythm without murmur gallop or rub she has baseline mild lower extremity edema.  Chest she has reduced air entry but could not really appreciate any overt congestion or wheezing- there is no labored breathing.  Abdomen is obese soft nontender with positive bowel sounds.  Musculoskeletal- is able to move all extremities x4 ambulates in wheelchair- she does have limited range of motion of her left arm secondary to shoulder issues- I do not note any deformity of the shoulder or acute tenderness to palpation.  Neurologic is grossly intact her speech is clear no lateralizing findings.  Psych she is grossly alert and largely oriented and does have some mild cognitive deficits-does get confused somewhat easily.  Labs.  Jul 26, 2017.  Sodium 139 potassium 3.6 BUN 14 creatinine 1.22  July 11, 2017.  Hgb A1c 6.9.  WBC 5.6 hemoglobin 12.1 platelets 127.  May 04, 2017.  Liver function test within normal limits except albumin of 2.6   Assessment and plan.  1.  COPD- this appears stable she is oxygen dependent continues on duo nebs routinely twice daily and appears to be doing well with this she is  also on Trelegy Ellipta as well as pro-air.  2-- diastolic CHF- she appears to be tolerating the 40 mg of Lasix well she has lost some weight I suspect this is fluid related- she is also on potassium supplementation- will update a metabolic panel to ensure stability  #3- history of chronic kidney disease-again last creatinine was 1.22 in  May this shows stability will update this.  4- history of atrial fibrillation this appears to be rate controlled on Cardizem is on Eliquis for anticoagulation  5.  History of type 2 diabetes this appears under decent control on Tradjenta hemoglobin A1c showing improvement at 6.9 on April lab CBGs have been largely in the lower mid 100s  6.  Dementia this appears to be mild-moderate-she is on low-dose Aricept and does well with supportive care    #7 history of depression she is on Cymbalta this appears to be stable as well.  8.  History of peripheral neuropathy likely diabetic related she is on Lyrica 100 mg twice daily  #9- history of chronic pain including left shoulder discomfort-again x-rays have been negative in the past does receive Vicodin in the morning-she also has topical Biofreeze for her knees- I suspect the Lyrica is also helping in this regard.  10.  History of rheumatoid arthritis she is on methotrexate once a week.  She is not really complaining of acute joint pain other than somewhat chronic left shoulder discomfort at this time  #11-history of peptic ulcer disease she is on Protonix this appears relatively asymptomatic.  12 history of again she is on diltiazem she is on this as well for her A. fib appears to be stable manual reading was 108/68 at this point will monitor  Again will update a CBC and metabolic panel to ensure stability of electrolytes keep an eye on some mild thrombocytopenia also evaluate kidney function  [id number]-of note greater than 45 minutes spent assessing patient-reviewing her chart and labs- coordinating and  formulating plan of care for numerous diagnoses-of note greater than 50% of time spent coordinating a plan of care

## 2017-08-18 ENCOUNTER — Encounter (HOSPITAL_COMMUNITY)
Admission: RE | Admit: 2017-08-18 | Discharge: 2017-08-18 | Disposition: A | Discharge status: Home / Self Care | Payer: Medicare Other | Source: Skilled Nursing Facility | Attending: Internal Medicine | Admitting: Internal Medicine

## 2017-08-18 DIAGNOSIS — F411 Generalized anxiety disorder: Secondary | ICD-10-CM | POA: Insufficient documentation

## 2017-08-18 DIAGNOSIS — I10 Essential (primary) hypertension: Secondary | ICD-10-CM | POA: Insufficient documentation

## 2017-08-18 DIAGNOSIS — E876 Hypokalemia: Secondary | ICD-10-CM | POA: Insufficient documentation

## 2017-08-18 DIAGNOSIS — F0390 Unspecified dementia without behavioral disturbance: Secondary | ICD-10-CM | POA: Insufficient documentation

## 2017-08-18 DIAGNOSIS — J189 Pneumonia, unspecified organism: Secondary | ICD-10-CM | POA: Insufficient documentation

## 2017-08-18 DIAGNOSIS — G9009 Other idiopathic peripheral autonomic neuropathy: Secondary | ICD-10-CM | POA: Insufficient documentation

## 2017-08-18 LAB — CBC WITH DIFFERENTIAL/PLATELET
BASOS ABS: 0 10*3/uL (ref 0.0–0.1)
Basophils Relative: 1 %
EOS ABS: 0.2 10*3/uL (ref 0.0–0.7)
EOS PCT: 4 %
HCT: 42.7 % (ref 36.0–46.0)
Hemoglobin: 12.7 g/dL (ref 12.0–15.0)
LYMPHS PCT: 38 %
Lymphs Abs: 1.6 10*3/uL (ref 0.7–4.0)
MCH: 26.2 pg (ref 26.0–34.0)
MCHC: 29.7 g/dL — ABNORMAL LOW (ref 30.0–36.0)
MCV: 88.2 fL (ref 78.0–100.0)
MONO ABS: 0.6 10*3/uL (ref 0.1–1.0)
Monocytes Relative: 13 %
Neutro Abs: 1.9 10*3/uL (ref 1.7–7.7)
Neutrophils Relative %: 44 %
PLATELETS: 108 10*3/uL — AB (ref 150–400)
RBC: 4.84 MIL/uL (ref 3.87–5.11)
RDW: 15.9 % — AB (ref 11.5–15.5)
WBC: 4.3 10*3/uL (ref 4.0–10.5)

## 2017-08-18 LAB — BASIC METABOLIC PANEL
ANION GAP: 8 (ref 5–15)
BUN: 18 mg/dL (ref 6–20)
CALCIUM: 9.7 mg/dL (ref 8.9–10.3)
CO2: 29 mmol/L (ref 22–32)
Chloride: 102 mmol/L (ref 101–111)
Creatinine, Ser: 1.18 mg/dL — ABNORMAL HIGH (ref 0.44–1.00)
GFR calc Af Amer: 47 mL/min — ABNORMAL LOW (ref >60)
GFR, EST NON AFRICAN AMERICAN: 40 mL/min — AB (ref >60)
GLUCOSE: 154 mg/dL — AB (ref 65–99)
POTASSIUM: 3.5 mmol/L (ref 3.5–5.1)
SODIUM: 139 mmol/L (ref 135–145)

## 2017-08-25 ENCOUNTER — Other Ambulatory Visit: Payer: Self-pay

## 2017-08-25 MED ORDER — HYDROCODONE-ACETAMINOPHEN 5-325 MG PO TABS: 1.0000 | ORAL_TABLET | Freq: Three times a day (TID) | ORAL | 0 refills | Status: DC | PRN

## 2017-08-25 NOTE — Telephone Encounter (Signed)
RX Fax for [facility]@ [phone]  

## 2017-09-25 DIAGNOSIS — E1051 Type 1 diabetes mellitus with diabetic peripheral angiopathy without gangrene: Secondary | ICD-10-CM | POA: Diagnosis not present

## 2017-09-25 DIAGNOSIS — L603 Nail dystrophy: Secondary | ICD-10-CM | POA: Diagnosis not present

## 2017-09-28 ENCOUNTER — Encounter: Payer: Self-pay | Admitting: Internal Medicine

## 2017-09-28 ENCOUNTER — Non-Acute Institutional Stay (SKILLED_NURSING_FACILITY): Payer: Medicare Other | Admitting: Internal Medicine

## 2017-09-28 DIAGNOSIS — J449 Chronic obstructive pulmonary disease, unspecified: Secondary | ICD-10-CM

## 2017-09-28 DIAGNOSIS — G629 Polyneuropathy, unspecified: Secondary | ICD-10-CM

## 2017-09-28 DIAGNOSIS — I00 Rheumatic fever without heart involvement: Secondary | ICD-10-CM

## 2017-09-28 DIAGNOSIS — I5032 Chronic diastolic (congestive) heart failure: Secondary | ICD-10-CM

## 2017-09-28 DIAGNOSIS — E1140 Type 2 diabetes mellitus with diabetic neuropathy, unspecified: Secondary | ICD-10-CM

## 2017-09-28 DIAGNOSIS — F3341 Major depressive disorder, recurrent, in partial remission: Secondary | ICD-10-CM

## 2017-09-28 DIAGNOSIS — I10 Essential (primary) hypertension: Secondary | ICD-10-CM

## 2017-09-28 DIAGNOSIS — M0520 Rheumatoid vasculitis with rheumatoid arthritis of unspecified site: Secondary | ICD-10-CM

## 2017-09-28 DIAGNOSIS — I4891 Unspecified atrial fibrillation: Secondary | ICD-10-CM | POA: Diagnosis not present

## 2017-09-28 NOTE — Progress Notes (Signed)
Location:   [facility] Room Number: 152/W Place of Service:  SNF [phone]) Provider:  Veleta Miners MD  Celene Squibb, MD  Patient Care Team: Celene Squibb, MD as PCP - General  Extended Emergency Contact Information Primary Emergency Contact: Nance,Sheila Address: [address]          [place], [place] [address] [place] Phone: [phone] Relation: Daughter Secondary Emergency Contact: Charlynne Pander, [place] [address] [place] Phone: [phone] Relation: Granddaughter  Code Status:  DNR Goals of care: Advanced Directive information Advanced Directives 09/28/2017  Does Patient Have a Medical Advance Directive? Yes  Type of Advance Directive Out of facility DNR (pink MOST or yellow form)  Does patient want to make changes to medical advance directive? No - Patient declined  Copy of [place] in Chart? No - copy requested  Would patient like information on creating a medical advance directive? No - Patient declined  Pre-existing out of facility DNR order (yellow form or pink MOST form) -     Chief Complaint  Patient presents with  . Medical Management of Chronic Issues    atient is being seen for Routine Visit of Medical Management of Chronic Issues    HPI:  Pt is a 84 y.o. female seen today for medical management of chronic diseases.    Patient has h/o COPD, Chronic Atrial Fibrillation on Eliquis, Rheumatoid arthritis, Fibromyalgia, Peripheral Neuropathy, Hyperlipidemia, PUD, and anxiety disorder  Patient has been stable in the facility. She usually stays in her bed and does not like to participate in any activities. Her weight is stable at 197 Lbs. Her appetite is good. She c/o Chronic Pain in her Left Shoulder and Back. But she says that Biofreeze really helped her. Her BS are Mostly less then 150. She did c/o Some Cough But no SOB.  Past Medical History:  Diagnosis Date  .  Anemia   . Atrial fibrillation ([place])   . Back pain   . Bacterial pneumonia   . Cancer (HCC)    Skin   . Chronic respiratory failure ([place])   . Cognitive communication deficit   . COPD (chronic obstructive pulmonary disease) ([place])   . Dementia   . Dementia   . Depression   . Diabetes mellitus without complication ([place])   . Diverticulosis   . Diverticulosis   . Fatty liver   . Fibromyalgia   . Fibromyalgia   . Gastric polyp 09/08/11   at the anastomosis-inflammatory  . Generalized anxiety disorder   . Generalized anxiety disorder   . GERD (gastroesophageal reflux disease)   . GERD (gastroesophageal reflux disease)   . 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 Hypercholesteremia   . Hypertension   . Insomnia   . Internal hemorrhoids   . Muscle weakness   . Neuropathy   . On home O2    qhs  . Osteoporosis   . Rheumatoid arteritis   . Ulcer    stomach  . Vitamin D deficiency    Past Surgical History:  Procedure Laterality Date  . ABDOMINAL HYSTERECTOMY    . ABDOMINAL SURGERY     removed patial stomach from ulcer  . COLONOSCOPY  04-2001   internal hemorrhoids, panocolonic diverticulosis, and colon polyps   . UPPER GASTROINTESTINAL ENDOSCOPY      Allergies  Allergen Reactions  . Other     Band Aids   .  Tape   . Lipitor [Atorvastatin Calcium] Rash and Other (See Comments)    Muscle pain  . Niaspan [Niacin Er] Rash and Other (See Comments)    Muscle pain    Outpatient Encounter Medications as of 09/28/2017  Medication Sig  . acetaminophen (TYLENOL) 325 MG tablet Take 650 mg by mouth every 8 (eight) hours as needed.  Marland Kitchen albuterol (PROAIR HFA) 108 (90 BASE) MCG/ACT inhaler Inhale 2 puffs into the lungs every 6 (six) hours as needed for wheezing or shortness of breath. For rescue   . apixaban (ELIQUIS) 2.5 MG TABS tablet Take 2.5 mg by mouth 2 (two) times daily.  Roseanne Kaufman Peru-Castor Oil (VENELEX) OINT Apply to sacrum and bilateral buttocks q shift and prn for  prevention  . CARTIA XT 300 MG 24 hr capsule TAKE ONE CAPSULE BY MOUTH EVERY DAY  . donepezil (ARICEPT) 5 MG tablet Take 5 mg by mouth at bedtime.  . DULoxetine (CYMBALTA) 60 MG capsule Take 60 mg by mouth daily.    . Fluticasone-Umeclidin-Vilant (TRELEGY ELLIPTA) 100-62.5-25 MCG/INH AEPB One puff inhalation once a day  . furosemide (LASIX) 40 MG tablet Take 40 mg by mouth daily.  Marland Kitchen guaifenesin (ROBITUSSIN) 100 MG/5ML syrup Take 200 mg by mouth 4 (four) times daily as needed for cough.  Marland Kitchen HYDROcodone-acetaminophen (NORCO/VICODIN) 5-325 MG tablet Take 1 tablet by mouth daily.  Marland Kitchen ipratropium-albuterol (DUONEB) 0.5-2.5 (3) MG/3ML SOLN Take 3 mLs by nebulization 2 (two) times daily as needed.  . linagliptin (TRADJENTA) 5 MG TABS tablet Take 1 tablet (5 mg total) by mouth daily.  . Menthol, Topical Analgesic, (BIOFREEZE) 4 % GEL Apply to both knees daily as needed  . methotrexate 2.5 MG tablet Take 12.5 mg by mouth. On Thursday at noon.  . Multiple Vitamin (DAILY VITE PO) Take 1 tablet by mouth daily.  . pantoprazole (PROTONIX) 40 MG tablet Take 1 tablet (40 mg total) by mouth daily.  . phenylephrine-shark liver oil-mineral oil-petrolatum (PREPARATION H) 0.25-3-14-71.9 % rectal ointment Place 1 application rectally 2 (two) times daily as needed for hemorrhoids.  . polyethylene glycol (MIRALAX / GLYCOLAX) packet Take 17 g by mouth daily as needed for moderate constipation.  . potassium chloride SA (K-DUR,KLOR-CON) 20 MEQ tablet Take 2 tablets (40 mg ) by mouth twice a day for 3 days from 06/02/2017-06/04/2017. Then take 40 mg by mouth once a day starting 06/05/2017  . pregabalin (LYRICA) 100 MG capsule Take 1 capsule (100 mg total) by mouth 2 (two) times daily.  Marland Kitchen Propylene Glycol (SYSTANE BALANCE) 0.6 % SOLN Place 1 drop into both eyes 2 (two) times daily.  . raloxifene (EVISTA) 60 MG tablet Take 60 mg by mouth daily.  . rosuvastatin (CRESTOR) 20 MG tablet Take 20 mg by mouth daily.  . [DISCONTINUED]  benzonatate (TESSALON) 100 MG capsule Take 100 mg by mouth 3 (three) times daily as needed for cough.  . [DISCONTINUED] clotrimazole-betamethasone (LOTRISONE) cream Apply 1 application topically daily. Apply to forehead  . [DISCONTINUED] HYDROcodone-acetaminophen (NORCO/VICODIN) 5-325 MG tablet Take 1 tablet by mouth every 8 (eight) hours as needed for moderate pain. Starting 04/24/2017 (Patient taking differently: Take 1 tablet by mouth daily. Starting 04/24/2017 )  . [DISCONTINUED] ipratropium-albuterol (DUONEB) 0.5-2.5 (3) MG/3ML SOLN Take 3 mLs by nebulization every 6 (six) hours.   No facility-administered encounter medications on file as of 09/28/2017.      Review of Systems  Review of Systems  Constitutional: Negative for activity change, appetite change, chills, diaphoresis, fatigue and fever.  HENT: Negative for mouth sores, postnasal drip, rhinorrhea, sinus pain and sore throat.   Respiratory: Negative for apnea,  chest tightness, shortness of breath and wheezing.   Cardiovascular: Negative for chest pain, palpitations and leg swelling.  Gastrointestinal: Negative for abdominal distention, abdominal pain, constipation, diarrhea, nausea and vomiting.  Genitourinary: Negative for dysuria and frequency.  Musculoskeletal: Negative for arthralgias, joint swelling and myalgias.  Skin: Negative for rash.  Neurological: Negative for dizziness, syncope, weakness, light-headedness and numbness.  Psychiatric/Behavioral: Negative for behavioral problems, confusion and sleep disturbance.     Immunization History  Administered Date(s) Administered  . Tdap 10/21/2016   Pertinent  Health Maintenance Due  Topic Date Due  . URINE MICROALBUMIN  10/29/2017 (Originally 06/05/1938)  . DEXA SCAN  10/29/2017 (Originally 06/06/1995)  . PNA vac Low Risk Adult (1 of 2 - PCV13) 10/29/2017 (Originally [birth year]/06/1995)  . INFLUENZA VACCINE  10/12/2017  . HEMOGLOBIN A1C  01/10/2018  . FOOT EXAM  06/22/2018  .  OPHTHALMOLOGY EXAM  07/05/2018   Fall Risk  08/15/2017  Falls in the past year? No   Functional Status Survey:    Vitals:   09/28/17 1403  BP: 129/69  Pulse: 70  Resp: 18  Temp: 97.7 F (36.5 C)  TempSrc: Oral  SpO2: 95%  Weight: 197 lb 9.6 oz (89.6 kg)  Height: 5\' 3"  (1.6 m)   Body mass index is 35 kg/m. Physical Exam  Constitutional: She appears well-developed and well-nourished.  HENT:  Head: Normocephalic.  Mouth/Throat: Oropharynx is clear and moist.  Eyes: Pupils are equal, round, and reactive to light.  Neck: Neck supple.  Cardiovascular: Normal rate. An irregular rhythm present.  Murmur heard. Pulmonary/Chest: Effort normal and breath sounds normal. No stridor. No respiratory distress. She has no wheezes.  Abdominal: Soft. Bowel sounds are normal. She exhibits no distension. There is no tenderness. There is no guarding.  Musculoskeletal:  Mild edema Bilateral  Lymphadenopathy:    She has no cervical adenopathy.  Neurological: She is alert.  Not oriented. No Focal deficits. Cannot move her Left UE due to Frozen shoulder  Skin: Skin is warm.  Psychiatric: She has a normal mood and affect. Her behavior is normal. Thought content normal.    Labs reviewed: Recent Labs    03/31/17 1852  05/03/17 0914  06/12/17 0727  06/19/17 0730 07/11/17 0714 07/26/17 0727 08/18/17 0726  NA 142   < > 136   < > 140   < > 139  --  139 139  K 3.5   < > 2.8*   < > 3.3*   < > 3.9  --  3.6 3.5  CL 102   < > 93*   < > 102   < > 103  --  105 102  CO2 29   < > 29   < > 30   < > 27  --  28 29  GLUCOSE 165*   < > 296*   < > 171*   < > 160*  --  162* 154*  BUN 17   < > 22*   < > 14   < > 9  --  14 18  CREATININE 1.29*   < > 1.60*   < > 1.19*   < > 1.10*  --  1.22* 1.18*  CALCIUM 9.7   < > 9.3   < > 9.5   < > 9.6  --  9.4 9.7  MG 2.0   < > 1.7  --  1.9  --   --  1.8  --   --   PHOS 3.0  --   --   --   --   --   --   --   --   --    < > = values in this interval not displayed.    Recent Labs    10/25/16 1924 03/31/17 1852 05/04/17 0452  AST 47* 30 21  ALT 39 17 14  ALKPHOS 75 70 62  BILITOT 0.6 0.7 0.5  PROT 6.4* 6.9 5.7*  ALBUMIN 3.1* 3.2* 2.6*   Recent Labs    06/01/17 1353 07/11/17 0714 08/18/17 0726  WBC 7.8 5.6 4.3  NEUTROABS 3.1 3.1 1.9  HGB 11.9* 12.1 12.7  HCT 40.1 39.3 42.7  MCV 87.7 87.9 88.2  PLT 115* 127* 108*   Lab Results  Component Value Date   TSH 2.992 07/26/2016   Lab Results  Component Value Date   HGBA1C 6.9 (H) 07/11/2017   No results found for: CHOL, HDL, LDLCALC, LDLDIRECT, TRIG, CHOLHDL  Significant Diagnostic Results in last 30 days:  No results found.  Assessment/Plan Diastolic CHF Patient is doing well. Her weight is stable Will repeat Chest Xray to follow her RLL effusion Type2 diabetes mellitus  On Tradjenta BS were running Less then 150 Repeat A1C On Lyrica for Neuropathy COPD Continue on Nebs.PRN On Trelegy and Oxygen IAtrial fibrillation Rate controlled on diltiazem On Eliquis Will continue Low dose as patient has h/o Falls and very frail  Left shoulder Pain Xray in hospital showed Arthritis Will try Biofreeze and Norco for Pain   Rheumatoid arthritis, On Chronic Methotrexate Will repeat Hepatic panel Patient on Norco PRN Also will try to d/w the Daughter about eventually taking her off Methotrexate as she has not had any active disease. H/O Aspiration Pneumonia She is on Dysphagia Diet. Hyperlipidemia On Crestor Dementia Patient on Aricept. Stays Full dependent for her ADL Depression On Cymbalta Also patient has been seen to be in the bed most of the time and does not participate in any activities. Will try to reduce her Lyrica and see if it helps with her Lethargy. GERD On Protonix     Family/ staff Communication:   Labs/tests ordered:   Total time spent in this patient care encounter was 25_ minutes; greater than 50% of the visit spent counseling patient, reviewing  records , Labs and coordinating care for problems addressed at this encounter.

## 2017-09-29 ENCOUNTER — Other Ambulatory Visit: Payer: Self-pay

## 2017-09-29 ENCOUNTER — Ambulatory Visit (HOSPITAL_COMMUNITY)
Admission: RE | Admit: 2017-09-29 | Discharge: 2017-09-29 | Disposition: A | Discharge status: Home / Self Care | Payer: Medicare Other | Source: Ambulatory Visit | Attending: Internal Medicine | Admitting: Internal Medicine

## 2017-09-29 ENCOUNTER — Encounter (HOSPITAL_COMMUNITY)
Admission: RE | Admit: 2017-09-29 | Discharge: 2017-09-29 | Disposition: A | Discharge status: Home / Self Care | Payer: Medicare Other | Source: Skilled Nursing Facility | Attending: Internal Medicine | Admitting: Internal Medicine

## 2017-09-29 DIAGNOSIS — J90 Pleural effusion, not elsewhere classified: Secondary | ICD-10-CM | POA: Diagnosis not present

## 2017-09-29 DIAGNOSIS — J9611 Chronic respiratory failure with hypoxia: Secondary | ICD-10-CM | POA: Diagnosis not present

## 2017-09-29 DIAGNOSIS — I517 Cardiomegaly: Secondary | ICD-10-CM | POA: Diagnosis not present

## 2017-09-29 DIAGNOSIS — J449 Chronic obstructive pulmonary disease, unspecified: Secondary | ICD-10-CM | POA: Diagnosis not present

## 2017-09-29 DIAGNOSIS — R05 Cough: Secondary | ICD-10-CM | POA: Diagnosis not present

## 2017-09-29 DIAGNOSIS — E118 Type 2 diabetes mellitus with unspecified complications: Secondary | ICD-10-CM | POA: Diagnosis not present

## 2017-09-29 DIAGNOSIS — I129 Hypertensive chronic kidney disease with stage 1 through stage 4 chronic kidney disease, or unspecified chronic kidney disease: Secondary | ICD-10-CM | POA: Diagnosis not present

## 2017-09-29 DIAGNOSIS — I700 Atherosclerosis of aorta: Secondary | ICD-10-CM | POA: Insufficient documentation

## 2017-09-29 DIAGNOSIS — R0602 Shortness of breath: Secondary | ICD-10-CM | POA: Diagnosis not present

## 2017-09-29 LAB — BASIC METABOLIC PANEL
Anion gap: 9 (ref 5–15)
BUN: 18 mg/dL (ref 8–23)
CALCIUM: 10 mg/dL (ref 8.9–10.3)
CHLORIDE: 103 mmol/L (ref 98–111)
CO2: 31 mmol/L (ref 22–32)
CREATININE: 1.13 mg/dL — AB (ref 0.44–1.00)
GFR calc Af Amer: 49 mL/min — ABNORMAL LOW (ref >60)
GFR calc non Af Amer: 42 mL/min — ABNORMAL LOW (ref >60)
GLUCOSE: 134 mg/dL — AB (ref 70–99)
Potassium: 3.7 mmol/L (ref 3.5–5.1)
Sodium: 143 mmol/L (ref 135–145)

## 2017-09-29 LAB — HEPATIC FUNCTION PANEL
ALK PHOS: 77 U/L (ref 38–126)
ALT: 61 U/L — ABNORMAL HIGH (ref 0–44)
AST: 67 U/L — ABNORMAL HIGH (ref 15–41)
Albumin: 3.5 g/dL (ref 3.5–5.0)
BILIRUBIN DIRECT: 0.1 mg/dL (ref 0.0–0.2)
BILIRUBIN INDIRECT: 0.5 mg/dL (ref 0.3–0.9)
BILIRUBIN TOTAL: 0.6 mg/dL (ref 0.3–1.2)
Total Protein: 6.5 g/dL (ref 6.5–8.1)

## 2017-09-29 LAB — SEDIMENTATION RATE: SED RATE: 8 mm/h (ref 0–22)

## 2017-09-29 LAB — HEMOGLOBIN A1C
HEMOGLOBIN A1C: 6.4 % — AB (ref 4.8–5.6)
Mean Plasma Glucose: 136.98 mg/dL

## 2017-09-29 MED ORDER — PREGABALIN 100 MG PO CAPS: 100.0000 mg | ORAL_CAPSULE | Freq: Two times a day (BID) | ORAL | 0 refills | Status: DC

## 2017-09-29 NOTE — Telephone Encounter (Signed)
RX Fax for [facility]@ [phone]  

## 2017-10-17 ENCOUNTER — Other Ambulatory Visit: Payer: Self-pay

## 2017-10-17 MED ORDER — PREGABALIN 100 MG PO CAPS: 100.0000 mg | ORAL_CAPSULE | Freq: Two times a day (BID) | ORAL | 0 refills | Status: DC

## 2017-10-17 NOTE — Telephone Encounter (Signed)
RX Fax for [facility]@ [phone]  

## 2017-10-18 ENCOUNTER — Other Ambulatory Visit: Payer: Self-pay

## 2017-10-18 MED ORDER — HYDROCODONE-ACETAMINOPHEN 5-325 MG PO TABS: 1.0000 | ORAL_TABLET | Freq: Every day | ORAL | 0 refills | Status: DC

## 2017-10-18 NOTE — Telephone Encounter (Signed)
RX Fax for [facility]@ [phone]  

## 2017-11-07 ENCOUNTER — Encounter: Payer: Self-pay | Admitting: Internal Medicine

## 2017-11-07 ENCOUNTER — Non-Acute Institutional Stay (SKILLED_NURSING_FACILITY): Payer: Medicare Other | Admitting: Internal Medicine

## 2017-11-07 DIAGNOSIS — I5032 Chronic diastolic (congestive) heart failure: Secondary | ICD-10-CM

## 2017-11-07 DIAGNOSIS — M25511 Pain in right shoulder: Secondary | ICD-10-CM | POA: Diagnosis not present

## 2017-11-07 DIAGNOSIS — E1140 Type 2 diabetes mellitus with diabetic neuropathy, unspecified: Secondary | ICD-10-CM

## 2017-11-07 DIAGNOSIS — M79601 Pain in right arm: Secondary | ICD-10-CM

## 2017-11-07 DIAGNOSIS — M069 Rheumatoid arthritis, unspecified: Secondary | ICD-10-CM

## 2017-11-07 DIAGNOSIS — J449 Chronic obstructive pulmonary disease, unspecified: Secondary | ICD-10-CM

## 2017-11-07 DIAGNOSIS — M797 Fibromyalgia: Secondary | ICD-10-CM | POA: Diagnosis not present

## 2017-11-07 DIAGNOSIS — I4891 Unspecified atrial fibrillation: Secondary | ICD-10-CM | POA: Diagnosis not present

## 2017-11-07 DIAGNOSIS — F0390 Unspecified dementia without behavioral disturbance: Secondary | ICD-10-CM

## 2017-11-07 DIAGNOSIS — M25521 Pain in right elbow: Secondary | ICD-10-CM | POA: Diagnosis not present

## 2017-11-07 DIAGNOSIS — M79621 Pain in right upper arm: Secondary | ICD-10-CM | POA: Diagnosis not present

## 2017-11-07 DIAGNOSIS — M79631 Pain in right forearm: Secondary | ICD-10-CM | POA: Diagnosis not present

## 2017-11-07 NOTE — Progress Notes (Signed)
Location:    [facility] Room Number: 152/W Place of Service:  SNF [phone]) Provider:  Cory Roughen, MD  Patient Care Team: Celene Squibb, MD as PCP - General  Extended Emergency Contact Information Primary Emergency Contact: Nance,Sheila Address: [address]          [place], [place] [address] [place] Phone: [phone] Relation: Daughter Secondary Emergency Contact: Charlynne Pander, [place] [address] [place] Phone: [phone] Relation: Granddaughter  Code Status:  DNR Goals of care: Advanced Directive information Advanced Directives 11/07/2017  Does Patient Have a Medical Advance Directive? Yes  Type of Advance Directive Out of facility DNR (pink MOST or yellow form)  Does patient want to make changes to medical advance directive? No - Patient declined  Copy of [place] in Chart? No - copy requested  Would patient like information on creating a medical advance directive? No - Patient declined  Pre-existing out of facility DNR order (yellow form or pink MOST form) -     Chief Complaint  Patient presents with  . Acute Visit    Patients c/o Arm Pain  Also medical management of chronic medical conditions including diastolic CHF-COPD-atrial fibrillation- rheumatoid arthritis-fibromyalgia-peripheral neuropathy- hyperlipidemia- chronic left shoulder pain as well as back pain- and dementia-in addition to type 2 diabetes  HPI:  Pt is a 84 y.o. female seen today for an acute visit for follow-up of chronic medical conditions as well as an acute visit secondary to right lower arm pain.  Regards to her chronic medical conditions these appear to be relatively stable-she does have a history of diastolic CHF weight appears to be stable as well as edema-she has lost about 4 pounds.  Regards to COPD she is oxygen dependent she is on Trelegy Ellipta as well as PRN duo nebs and Pro Air  she also has guaifenesin if needed.  She does not complain of shortness of breath.  She does have a history of type 2 diabetes which appears well controlled on Tradjenta-CBGs are largely in the mid 100s hemoglobin A1c was 6.4 in July.  Regards atrial fibrillation this is rate controlled on Cardizem-she is on low-dose Eliquis on a low-dose because of her risk for falls.  She also has a history of rheumatoid arthritis at one point had been on methotrexate it appears this has been discontinued possibly because of some mildly elevated liver function tests and update liver function tests are pending.  In regards to pain issues she does have a history of fibromyalgia as well as osteoarthritis especially of her left shoulder and back- she continues on Norco routinely in the morning she also has PRN Tylenol throughout the day and topical Biofreeze which apparently gives some relief.  --In regards to her lower right arm pain she says this is been going on a couple days-she does not complain of any trauma says the pain is not really increased the last couple days but it does appear to be uncomfortable.      Past Medical History:  Diagnosis Date  . Anemia   . Atrial fibrillation ([facility])   . Back pain   . Bacterial pneumonia   . Cancer (HCC)    Skin   . Chronic respiratory failure ([facility])   . Cognitive communication deficit   . COPD (chronic obstructive pulmonary disease) ([facility])   . Dementia   . Dementia   .  Depression   . Diabetes mellitus without complication ([facility])   . Diverticulosis   . Diverticulosis   . Fatty liver   . Fibromyalgia   . Fibromyalgia   . Gastric polyp 09/08/11   at the anastomosis-inflammatory  . Generalized anxiety disorder   . Generalized anxiety disorder   . GERD (gastroesophageal reflux disease)   . GERD (gastroesophageal reflux disease)   . 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 Hypercholesteremia   . Hypertension   . Insomnia   . Internal hemorrhoids   .  Muscle weakness   . Neuropathy   . On home O2    qhs  . Osteoporosis   . Rheumatoid arteritis   . Ulcer    stomach  . Vitamin D deficiency    Past Surgical History:  Procedure Laterality Date  . ABDOMINAL HYSTERECTOMY    . ABDOMINAL SURGERY     removed patial stomach from ulcer  . COLONOSCOPY  04-2001   internal hemorrhoids, panocolonic diverticulosis, and colon polyps   . UPPER GASTROINTESTINAL ENDOSCOPY      Allergies  Allergen Reactions  . Other     Band Aids   . Tape   . Lipitor [Atorvastatin Calcium] Rash and Other (See Comments)    Muscle pain  . Niaspan [Niacin Er] Rash and Other (See Comments)    Muscle pain    Outpatient Encounter Medications as of 11/07/2017  Medication Sig  . acetaminophen (TYLENOL) 325 MG tablet Take 650 mg by mouth every 8 (eight) hours as needed.  Marland Kitchen albuterol (PROAIR HFA) 108 (90 BASE) MCG/ACT inhaler Inhale 2 puffs into the lungs every 6 (six) hours as needed for wheezing or shortness of breath. For rescue   . apixaban (ELIQUIS) 2.5 MG TABS tablet Take 2.5 mg by mouth 2 (two) times daily.  Roseanne Kaufman Peru-Castor Oil (VENELEX) OINT Apply to sacrum and bilateral buttocks q shift and prn for prevention  . CARTIA XT 300 MG 24 hr capsule TAKE ONE CAPSULE BY MOUTH EVERY DAY  . donepezil (ARICEPT) 5 MG tablet Take 5 mg by mouth at bedtime.  . DULoxetine (CYMBALTA) 60 MG capsule Take 60 mg by mouth daily.    . Fluticasone-Umeclidin-Vilant (TRELEGY ELLIPTA) 100-62.5-25 MCG/INH AEPB One puff inhalation once a day  . furosemide (LASIX) 40 MG tablet Take 40 mg by mouth daily.  Marland Kitchen guaifenesin (ROBITUSSIN) 100 MG/5ML syrup Take 200 mg by mouth 4 (four) times daily as needed for cough.  Marland Kitchen HYDROcodone-acetaminophen (NORCO/VICODIN) 5-325 MG tablet Take 1 tablet by mouth daily.  Marland Kitchen ipratropium-albuterol (DUONEB) 0.5-2.5 (3) MG/3ML SOLN Take 3 mLs by nebulization 2 (two) times daily as needed.  . linagliptin (TRADJENTA) 5 MG TABS tablet Take 1 tablet (5 mg total)  by mouth daily.  . Menthol, Topical Analgesic, (BIOFREEZE) 4 % GEL Apply to both knees daily as needed and to left shoulder  . Multiple Vitamin (DAILY VITE PO) Take 1 tablet by mouth daily.  . pantoprazole (PROTONIX) 40 MG tablet Take 1 tablet (40 mg total) by mouth daily.  . phenylephrine-shark liver oil-mineral oil-petrolatum (PREPARATION H) 0.25-3-14-71.9 % rectal ointment Place 1 application rectally 2 (two) times daily as needed for hemorrhoids.  . polyethylene glycol (MIRALAX / GLYCOLAX) packet Take 17 g by mouth daily as needed for moderate constipation.  . potassium chloride SA (K-DUR,KLOR-CON) 20 MEQ tablet Take 2 tablets (40 mg ) by mouth once a day  . pregabalin (LYRICA) 50 MG capsule Take 50 mg by mouth 2 (two) times daily.  Marland Kitchen  Propylene Glycol (SYSTANE BALANCE) 0.6 % SOLN Place 1 drop into both eyes 2 (two) times daily.  . raloxifene (EVISTA) 60 MG tablet Take 60 mg by mouth daily.  . rosuvastatin (CRESTOR) 20 MG tablet Take 20 mg by mouth daily.  . [DISCONTINUED] methotrexate 2.5 MG tablet Take 12.5 mg by mouth. On Thursday at noon.  . [DISCONTINUED] pregabalin (LYRICA) 100 MG capsule Take 1 capsule (100 mg total) by mouth 2 (two) times daily. (Patient taking differently: Take 50 mg by mouth 2 (two) times daily. )   No facility-administered encounter medications on file as of 11/07/2017.     Review of Systems    In general she is not complaining of any fever chills.  Skin does not complain of rashes or itching.  Head ears eyes nose mouth and throat is not complaining of sore throat or visual changes.  Respiratory is not complaining of shortness of breath or cough she is on chronic oxygen with history of COPD.  Cardiac is not complaining of chest pain has some mild lower extremity edema.  GI is not complaining of abdominal pain nausea vomiting diarrhea constipation.  Says she has a good appetite.  GU is not complaining of dysuria.  Musculoskeletal is complaining of  right lower arm pain has arthritic pain left shoulder and back this appears to be controlled at this time her main complaint is the right lower arm pain.  Neurologic does have a history of peripheral neuropathy but currently is not really complaining of numbness headache or dizziness.  Psych is not complaining of being depressed or anxious she is on Cymbalta with a history of depression-she tends to stay in her room but continues to be pleasant-Per nursing she is likes to stay by herself     Immunization History  Administered Date(s) Administered  . Tdap 10/21/2016   Pertinent  Health Maintenance Due  Topic Date Due  . INFLUENZA VACCINE  12/08/2017 (Originally 10/12/2017)  . URINE MICROALBUMIN  12/08/2017 (Originally 06/05/1938)  . DEXA SCAN  12/08/2017 (Originally 06/06/1995)  . PNA vac Low Risk Adult (1 of 2 - PCV13) 12/08/2017 (Originally 06/06/1995)  . HEMOGLOBIN A1C  04/01/2018  . FOOT EXAM  06/22/2018  . OPHTHALMOLOGY EXAM  07/05/2018   Fall Risk  08/15/2017  Falls in the past year? No   Functional Status Survey:     She is afebrile pulse is 68 respirations 18-blood pressure 118/70-weight is 193 appears to be down about 4 pounds over previous month Physical Exam   In general this is a pleasant elderly female in no distress lying comfortably in bed   Her skin is warm and dry.  Oropharynx is clear mucous membranes moist.  Chest is clear to auscultation with shallow air entry there is no labored breathing.  Heart is irregular irregular rate and rhythm with a baseline systolic murmur she has mild lower extremity edema.  Abdomen is obese soft nontender with positive bowel sounds.  Musculoskeletal Limited exam since she is in bed but is able to move all extremities x4 she does have limited range of motion of her left shoulder with history of arthritis.  She also has some mild erythema lower right armand wristthere is tenderness to touch of the lower arm and wrist area- there  is pain with flexion and extension of the right wrist- radial pulse is intac There is minimal edema   Neurologic is grossly intact her speech t her speech is clear no lateralizing findings  Psych she is oriented  to self pleasant and appropriate  Labs reviewed: Recent Labs    03/31/17 1852  05/03/17 0914  06/12/17 0727  07/11/17 0714 07/26/17 0727 08/18/17 0726 09/29/17 0715  NA 142   < > 136   < > 140   < >  --  139 139 143  K 3.5   < > 2.8*   < > 3.3*   < >  --  3.6 3.5 3.7  CL 102   < > 93*   < > 102   < >  --  105 102 103  CO2 29   < > 29   < > 30   < >  --  28 29 31   GLUCOSE 165*   < > 296*   < > 171*   < >  --  162* 154* 134*  BUN 17   < > 22*   < > 14   < >  --  14 18 18   CREATININE 1.29*   < > 1.60*   < > 1.19*   < >  --  1.22* 1.18* 1.13*  CALCIUM 9.7   < > 9.3   < > 9.5   < >  --  9.4 9.7 10.0  MG 2.0   < > 1.7  --  1.9  --  1.8  --   --   --   PHOS 3.0  --   --   --   --   --   --   --   --   --    < > = values in this interval not displayed.   Recent Labs    03/31/17 1852 05/04/17 0452 09/29/17 0715  AST 30 21 67*  ALT 17 14 61*  ALKPHOS 70 62 77  BILITOT 0.7 0.5 0.6  PROT 6.9 5.7* 6.5  ALBUMIN 3.2* 2.6* 3.5   Recent Labs    06/01/17 1353 07/11/17 0714 08/18/17 0726  WBC 7.8 5.6 4.3  NEUTROABS 3.1 3.1 1.9  HGB 11.9* 12.1 12.7  HCT 40.1 39.3 42.7  MCV 87.7 87.9 88.2  PLT 115* 127* 108*   Lab Results  Component Value Date   TSH 2.992 07/26/2016   Lab Results  Component Value Date   HGBA1C 6.4 (H) 09/29/2017   No results found for: CHOL, HDL, LDLCALC, LDLDIRECT, TRIG, CHOLHDL  Significant Diagnostic Results in last 30 days:  No results found.  Assessment/Plan  #1 history of right lower arm and wrist pain-will order an x-ray of the area- with her history of rheumatoid arthritis will update a sed rate and rheumatoid factor as well as a uric acid level-.  Will start empiric low-dose prednisone 10 mg a day for 5 days- continue to  monitor.  Will update a CBC with differential tomorrow as well.  This does not appear to be a cellulitic presentation.  2.  History of diastolic CHF at this point appears compensated she is on Lasix 40 mg a day with potassium supplementation edema appears to be baseline weight is relatively stable-will update a metabolic panel.  3.  History of rheumatoid arthritis as noted above will order a rheumatoid factor- she is no longer on methotrexate-will update liver function test since they were mildly elevated.  4.-History of osteoarthritis with left shoulder and back pain- she continues on routine Norco in the morning she has PRN Tylenol later in the day also has topical Biofreeze which apparently has given relief.  5.-  COPD this appears  stable she is on chronic oxygen as well asTrelegy Ellipta she is also on PRN duo nebs- pro-air-as well as guaifenesin.  6.  Atrial fibrillation this appears rate controlled on Cardizem-she is on low-dose Eliquis again not on a higher dose because of her fall risk.  7.-  History of type 2 diabetes-this appears well controlled on Tradjenta-CBGs are stable in the mid 100s- hemoglobin A1c was 6.4 in July.  8.  History of fibromyalgia she is on Cymbalta she also has other pain medications as noted above.  9.  History of peripheral neuropathy most likely diabetic related she is on Lyrica this was recently reduced secondary to lethargy concerns- patient still stays in her room a lot but this is by choice and per nursing staff not really because she is lethargic at this time  #10- history of hyperlipidemia she continues on Crestor.  11.  History of dementia she appears to be doing well with supportive care-she is on Aricept.  Her appetite continues to be good.  12.  History of GERD she continues on a proton pump inhibitor.   #13 history of osteoporosis continues on Evista.  [id number] note greater than 40 minutes spent assessing patient-discussing her status  with nursing staff- reviewing her chart and labs- and coordinating and formulating a plan of care for numerous diagnoses- of note greater than 50% of time spent coordinating a plan of care

## 2017-11-08 ENCOUNTER — Non-Acute Institutional Stay (SKILLED_NURSING_FACILITY): Payer: Medicare Other | Admitting: Internal Medicine

## 2017-11-08 ENCOUNTER — Encounter: Payer: Self-pay | Admitting: Internal Medicine

## 2017-11-08 ENCOUNTER — Encounter (HOSPITAL_COMMUNITY)
Admission: RE | Admit: 2017-11-08 | Discharge: 2017-11-08 | Disposition: A | Discharge status: Home / Self Care | Payer: Medicare Other | Source: Skilled Nursing Facility | Attending: Internal Medicine | Admitting: Internal Medicine

## 2017-11-08 DIAGNOSIS — D696 Thrombocytopenia, unspecified: Secondary | ICD-10-CM | POA: Diagnosis not present

## 2017-11-08 DIAGNOSIS — J209 Acute bronchitis, unspecified: Secondary | ICD-10-CM | POA: Diagnosis not present

## 2017-11-08 DIAGNOSIS — J449 Chronic obstructive pulmonary disease, unspecified: Secondary | ICD-10-CM | POA: Diagnosis not present

## 2017-11-08 DIAGNOSIS — G9009 Other idiopathic peripheral autonomic neuropathy: Secondary | ICD-10-CM | POA: Diagnosis not present

## 2017-11-08 DIAGNOSIS — M79601 Pain in right arm: Secondary | ICD-10-CM | POA: Diagnosis not present

## 2017-11-08 DIAGNOSIS — E785 Hyperlipidemia, unspecified: Secondary | ICD-10-CM | POA: Diagnosis not present

## 2017-11-08 DIAGNOSIS — N189 Chronic kidney disease, unspecified: Secondary | ICD-10-CM | POA: Insufficient documentation

## 2017-11-08 DIAGNOSIS — L03113 Cellulitis of right upper limb: Secondary | ICD-10-CM

## 2017-11-08 DIAGNOSIS — I129 Hypertensive chronic kidney disease with stage 1 through stage 4 chronic kidney disease, or unspecified chronic kidney disease: Secondary | ICD-10-CM | POA: Diagnosis not present

## 2017-11-08 DIAGNOSIS — J440 Chronic obstructive pulmonary disease with acute lower respiratory infection: Secondary | ICD-10-CM

## 2017-11-08 LAB — COMPREHENSIVE METABOLIC PANEL
ALK PHOS: 82 U/L (ref 38–126)
ALT: 14 U/L (ref 0–44)
AST: 18 U/L (ref 15–41)
Albumin: 3 g/dL — ABNORMAL LOW (ref 3.5–5.0)
Anion gap: 6 (ref 5–15)
BUN: 14 mg/dL (ref 8–23)
CALCIUM: 9.7 mg/dL (ref 8.9–10.3)
CO2: 32 mmol/L (ref 22–32)
CREATININE: 1.12 mg/dL — AB (ref 0.44–1.00)
Chloride: 104 mmol/L (ref 98–111)
GFR calc non Af Amer: 43 mL/min — ABNORMAL LOW (ref >60)
GFR, EST AFRICAN AMERICAN: 50 mL/min — AB (ref >60)
Glucose, Bld: 125 mg/dL — ABNORMAL HIGH (ref 70–99)
Potassium: 3.5 mmol/L (ref 3.5–5.1)
SODIUM: 142 mmol/L (ref 135–145)
Total Bilirubin: 0.5 mg/dL (ref 0.3–1.2)
Total Protein: 5.9 g/dL — ABNORMAL LOW (ref 6.5–8.1)

## 2017-11-08 LAB — CBC WITH DIFFERENTIAL/PLATELET
Basophils Absolute: 0 10*3/uL (ref 0.0–0.1)
Basophils Relative: 1 %
EOS ABS: 0.2 10*3/uL (ref 0.0–0.7)
Eosinophils Relative: 2 %
HCT: 42.8 % (ref 36.0–46.0)
HEMOGLOBIN: 13.1 g/dL (ref 12.0–15.0)
LYMPHS ABS: 2.3 10*3/uL (ref 0.7–4.0)
LYMPHS PCT: 35 %
MCH: 26.9 pg (ref 26.0–34.0)
MCHC: 30.6 g/dL (ref 30.0–36.0)
MCV: 87.9 fL (ref 78.0–100.0)
Monocytes Absolute: 0.9 10*3/uL (ref 0.1–1.0)
Monocytes Relative: 14 %
NEUTROS ABS: 3.2 10*3/uL (ref 1.7–7.7)
NEUTROS PCT: 48 %
Platelets: 95 10*3/uL — ABNORMAL LOW (ref 150–400)
RBC: 4.87 MIL/uL (ref 3.87–5.11)
RDW: 16.1 % — ABNORMAL HIGH (ref 11.5–15.5)
WBC: 6.6 10*3/uL (ref 4.0–10.5)

## 2017-11-08 LAB — SEDIMENTATION RATE: SED RATE: 7 mm/h (ref 0–22)

## 2017-11-08 LAB — URIC ACID: Uric Acid, Serum: 3.6 mg/dL (ref 2.5–7.1)

## 2017-11-08 NOTE — Progress Notes (Signed)
Location:    [facility] Room Number: 152/W Place of Service:  SNF [phone]) Provider:  Cory Roughen, MD  Patient Care Team: Celene Squibb, MD as PCP - General  Extended Emergency Contact Information Primary Emergency Contact: Nance,Sheila Address: [address]          [place], [place] [address] [place] Phone: [phone] Relation: Daughter Secondary Emergency Contact: Charlynne Pander,  [address] [place] Phone: [phone] Relation: Granddaughter  Code Status:  DNR Goals of care: Advanced Directive information Advanced Directives 11/08/2017  Does Patient Have a Medical Advance Directive? Yes  Type of Advance Directive Out of facility DNR (pink MOST or yellow form)  Does patient want to make changes to medical advance directive? No - Patient declined  Copy of [place] in Chart? No - copy requested  Would patient like information on creating a medical advance directive? No - Patient declined  Pre-existing out of facility DNR order (yellow form or pink MOST form) -     Chief Complaint  Patient presents with  . Acute Visit    Patient is being seen for f/u of wrist pain and swelling    HPI:  Pt is a 84 y.o. female seen today for an acute visit for follow-up of right lower arm and wrist pain.  She was seen yesterday for routine visit as well as acute visit for the pain in her lower right arm and wrist.  She does have a history of rheumatoid arthritis and we did order labs including a sedimentation rate rheumatoid factor uric acid as well as CBC with differential and metabolic panel.  Lab work has returned relatively unremarkable- she continues to have some pain in the area we did start her on low-dose prednisone last night.  Today she continues to have some mild erythema in the area and tenderness-since lab work was reassuring likelihood of gout is quite low-again  rheumatoid factor is pending- I did reassess her with Dr. Linna Darner this morning and because of the continued mild erythema and fairly unremarkable lab results will treat this as possible mild cellulitis.  She also has a mild cough this morning- doxycycline should be effective against this as well for possible mild bronchitis.  She does have a history of COPD--continues to be oxygen dependent she is also on Trelegy Ellipta as well as PRN duo nebs and PRN Pro Air and has guaifenesin ordered as needed.  Currently she is sitting in a chair comfortably does not really complain of any shortness of breath or pain but when her right arm is palpated does still have some tenderness and does have some persistent mild erythema  Her other diagnoses include diastolic CHF-atrial fibrillation- fibromyalgia as well as peripheral neuropathy hyperlipidemia chronic left shoulder pain and back pain and dementia as well as well-controlled type 2 diabetes   Past Medical History:  Diagnosis Date  . Anemia   . Atrial fibrillation ([facility])   . Back pain   . Bacterial pneumonia   . Cancer (HCC)    Skin   . Chronic respiratory failure ([facility])   . Cognitive communication deficit   . COPD (chronic obstructive pulmonary disease) ([facility])   . Dementia   . Dementia   . Depression   . Diabetes mellitus without complication ([facility])   . Diverticulosis   . Diverticulosis   . Fatty liver   .  Fibromyalgia   . Fibromyalgia   . Gastric polyp 09/08/11   at the anastomosis-inflammatory  . Generalized anxiety disorder   . Generalized anxiety disorder   . GERD (gastroesophageal reflux disease)   . GERD (gastroesophageal reflux disease)   . 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 Hypercholesteremia   . Hypertension   . Insomnia   . Internal hemorrhoids   . Muscle weakness   . Neuropathy   . On home O2    qhs  . Osteoporosis   . Rheumatoid arteritis   . Ulcer    stomach  . Vitamin D deficiency    Past Surgical History:    Procedure Laterality Date  . ABDOMINAL HYSTERECTOMY    . ABDOMINAL SURGERY     removed patial stomach from ulcer  . COLONOSCOPY  04-2001   internal hemorrhoids, panocolonic diverticulosis, and colon polyps   . UPPER GASTROINTESTINAL ENDOSCOPY      Allergies  Allergen Reactions  . Other     Band Aids   . Tape   . Lipitor [Atorvastatin Calcium] Rash and Other (See Comments)    Muscle pain  . Niaspan [Niacin Er] Rash and Other (See Comments)    Muscle pain    Outpatient Encounter Medications as of 11/08/2017  Medication Sig  . acetaminophen (TYLENOL) 325 MG tablet Take 650 mg by mouth every 8 (eight) hours as needed.  Marland Kitchen albuterol (PROAIR HFA) 108 (90 BASE) MCG/ACT inhaler Inhale 2 puffs into the lungs every 6 (six) hours as needed for wheezing or shortness of breath. For rescue   . apixaban (ELIQUIS) 2.5 MG TABS tablet Take 2.5 mg by mouth 2 (two) times daily.  Roseanne Kaufman Peru-Castor Oil (VENELEX) OINT Apply to sacrum and bilateral buttocks q shift and prn for prevention  . CARTIA XT 300 MG 24 hr capsule TAKE ONE CAPSULE BY MOUTH EVERY DAY  . donepezil (ARICEPT) 5 MG tablet Take 5 mg by mouth at bedtime.  . DULoxetine (CYMBALTA) 60 MG capsule Take 60 mg by mouth daily.    . Fluticasone-Umeclidin-Vilant (TRELEGY ELLIPTA) 100-62.5-25 MCG/INH AEPB One puff inhalation once a day  . furosemide (LASIX) 40 MG tablet Take 40 mg by mouth daily.  Marland Kitchen guaifenesin (ROBITUSSIN) 100 MG/5ML syrup Take 200 mg by mouth 4 (four) times daily as needed for cough.  Marland Kitchen HYDROcodone-acetaminophen (NORCO/VICODIN) 5-325 MG tablet Take 1 tablet by mouth daily.  Marland Kitchen ipratropium-albuterol (DUONEB) 0.5-2.5 (3) MG/3ML SOLN Take 3 mLs by nebulization 2 (two) times daily as needed.  . linagliptin (TRADJENTA) 5 MG TABS tablet Take 1 tablet (5 mg total) by mouth daily.  . Menthol, Topical Analgesic, (BIOFREEZE) 4 % GEL Apply to both knees daily as needed and to left shoulder  . Multiple Vitamin (DAILY VITE PO) Take 1 tablet  by mouth daily.  . pantoprazole (PROTONIX) 40 MG tablet Take 1 tablet (40 mg total) by mouth daily.  . phenylephrine-shark liver oil-mineral oil-petrolatum (PREPARATION H) 0.25-3-14-71.9 % rectal ointment Place 1 application rectally 2 (two) times daily as needed for hemorrhoids.  . polyethylene glycol (MIRALAX / GLYCOLAX) packet Take 17 g by mouth daily as needed for moderate constipation.  . potassium chloride SA (K-DUR,KLOR-CON) 20 MEQ tablet Take 2 tablets (40 mg ) by mouth once a day  . predniSONE (DELTASONE) 10 MG tablet Take 10 mg by mouth daily with breakfast.  . pregabalin (LYRICA) 50 MG capsule Take 50 mg by mouth 2 (two) times daily.  Marland Kitchen Propylene Glycol (SYSTANE BALANCE) 0.6 % SOLN Place  1 drop into both eyes 2 (two) times daily.  . raloxifene (EVISTA) 60 MG tablet Take 60 mg by mouth daily.  . rosuvastatin (CRESTOR) 20 MG tablet Take 20 mg by mouth daily.   No facility-administered encounter medications on file as of 11/08/2017.     Review of Systems--this is somewhat limited since she is a somewhat poor historian.  But in general is not complaining of any fever or chills.  Skin is not complain of rashes or itching again does have some mild persistent lower right arm erythema.  Head ears eyes nose mouth and throat is not complaining of sore throat or visual changes or nasal discharge.  Respiratory is not really complaining of shortness of breath but does appear to have a  cough nonproductive today does not complain of shortness of breath she is on chronic oxygen with history of COPD  Cardiac does not complain of chest pain continues with baseline lower extremity edema.  GI is not complaining of abdominal pain nausea vomiting diarrhea constipation.  Musculoskeletal is not complaining of acute pain however when her right lower arm and wrist is palpated still does grimace some.  Neurologic does not complain of dizziness headache or numbness or syncope at this point.  Psych  continues to be pleasant and appropriate does have some cognitive deficits at baseline--does not complain of being anxious or depressed  Immunization History  Administered Date(s) Administered  . Tdap 10/21/2016   Pertinent  Health Maintenance Due  Topic Date Due  . INFLUENZA VACCINE  12/08/2017 (Originally 10/12/2017)  . URINE MICROALBUMIN  12/08/2017 (Originally 06/05/1938)  . DEXA SCAN  12/08/2017 (Originally 06/06/1995)  . PNA vac Low Risk Adult (1 of 2 - PCV13) 12/08/2017 (Originally 06/06/1995)  . HEMOGLOBIN A1C  04/01/2018  . FOOT EXAM  06/22/2018  . OPHTHALMOLOGY EXAM  07/05/2018   Fall Risk  08/15/2017  Falls in the past year? No   Functional Status Survey:    Temperature is 97.9 pulse of 75 respirations 20 blood pressure 119/56-saturation is 96% on chronic oxygen  Physical Exam   In general this is a pleasant elderly female in no distress.  Her skin is warm and dry.  She continues to have some mild erythema of her lower right arm this is somewhat warm and tender to touch.  Eyes visual acuity appears intact sclera and conjunctive are clear.  Oropharynx is clear mucous membranes moist.  Chest she does have some slight coarse breath sounds today- there is no labored breathing.  Heart is irregular irregular rate and rhythm baseline systolic murmur she has mild baseline lower extremity edema.  Abdomen is obese soft nontender with positive bowel sounds.  Musculoskeletal moves all her extremities at baseline she does have some tenderness to palpation of the right lower arm as noted above appears to still have a little bit of pain with flexion and extension of the wrist but not as much as last night  Does have some mild erythema right lower arm slightly warm to touch.  Neurologic is grossly intact her speech is clear no lateralizing findings.  Psych she is oriented to self pleasant and appropriate does have some confusion but can carry on a short conversation   Labs  reviewed: November 08, 2017.  Sed rate is 7- uric acid is 3.6.  Sodium 142 potassium 3.5 BUN 14 creatinine 1.12.  WBC 6.6 hemoglobin 13.1 platelets 95,000   Assessment and plan.  1.-Right lower arm and wrist pain- the pain does appear to  be somewhat reduced today but she still has some erythema of the right lower arm that is somewhat warm to touch- I did discuss this with Dr. Linna Darner who is in the facility-and will treat this empirically as cellulitis with doxycycline 100 mg twice daily for 7 days also will add a probiotic for 10 days.  At this point continue the prednisone since the pain appears to be improved somewhat with it.  Again so far labs have been reassuring without elevated sed rate uric acid or white count.  Continue to monitor.  2.  History of COPD- assessed with Dr. Linna Darner as well-she does have a mild cough and some congested sounds today- per discussion with Dr. Linna Darner will again start the doxycycline for this as well--also will give routine Mucinex 600 mg twice daily for 7 days to help with this as well.  Well her vital signs pulse ox every shift for 72 hours to keep an eye on her again she does not appear to be in any form of distress or discomfort but with her COPD history will have to be watched.  3.-  Thrombocytopenia this appears relatively chronic-her platelets are 95,000 on lab done today average appears to be around low 100,000's she does not show evidence of increased bruising or bleeding will monitor this again periodically  [id number]-of note greater than 25 minutes spent assessing patient- including assessing with Dr. Lenon Ahmadi her chart and labs- coordinating and formulating plan of care- greater than 50% of time spent coordinating plan of care  Recent Labs    03/31/17 1852  05/03/17 0914  06/12/17 0727  07/11/17 0714  08/18/17 0726 09/29/17 0715 11/08/17 0706  NA 142   < > 136   < > 140   < >  --    < > 139 143 142  K 3.5   < > 2.8*   < > 3.3*    < >  --    < > 3.5 3.7 3.5  CL 102   < > 93*   < > 102   < >  --    < > 102 103 104  CO2 29   < > 29   < > 30   < >  --    < > 29 31 32  GLUCOSE 165*   < > 296*   < > 171*   < >  --    < > 154* 134* 125*  BUN 17   < > 22*   < > 14   < >  --    < > 18 18 14   CREATININE 1.29*   < > 1.60*   < > 1.19*   < >  --    < > 1.18* 1.13* 1.12*  CALCIUM 9.7   < > 9.3   < > 9.5   < >  --    < > 9.7 10.0 9.7  MG 2.0   < > 1.7  --  1.9  --  1.8  --   --   --   --   PHOS 3.0  --   --   --   --   --   --   --   --   --   --    < > = values in this interval not displayed.   Recent Labs    05/04/17 0452 09/29/17 0715 11/08/17 0706  AST 21 67* 18  ALT 14 61* 14  ALKPHOS 62  77 82  BILITOT 0.5 0.6 0.5  PROT 5.7* 6.5 5.9*  ALBUMIN 2.6* 3.5 3.0*   Recent Labs    07/11/17 0714 08/18/17 0726 11/08/17 0706  WBC 5.6 4.3 6.6  NEUTROABS 3.1 1.9 3.2  HGB 12.1 12.7 13.1  HCT 39.3 42.7 42.8  MCV 87.9 88.2 87.9  PLT 127* 108* 95*   Lab Results  Component Value Date   TSH 2.992 07/26/2016   Lab Results  Component Value Date   HGBA1C 6.4 (H) 09/29/2017   No results found for: CHOL, HDL, LDLCALC, LDLDIRECT, TRIG, CHOLHDL  Significant Diagnostic Results in last 30 days:  No results found.

## 2017-11-09 LAB — RHEUMATOID FACTOR: RHEUMATOID FACTOR: 29.6 [IU]/mL — AB (ref 0.0–13.9)

## 2017-11-14 ENCOUNTER — Other Ambulatory Visit: Payer: Self-pay

## 2017-11-14 ENCOUNTER — Encounter: Payer: Self-pay | Admitting: Internal Medicine

## 2017-11-14 ENCOUNTER — Non-Acute Institutional Stay (SKILLED_NURSING_FACILITY): Payer: Medicare Other | Admitting: Internal Medicine

## 2017-11-14 DIAGNOSIS — E1140 Type 2 diabetes mellitus with diabetic neuropathy, unspecified: Secondary | ICD-10-CM

## 2017-11-14 DIAGNOSIS — M05742 Rheumatoid arthritis with rheumatoid factor of left hand without organ or systems involvement: Secondary | ICD-10-CM

## 2017-11-14 DIAGNOSIS — J449 Chronic obstructive pulmonary disease, unspecified: Secondary | ICD-10-CM

## 2017-11-14 MED ORDER — PREGABALIN 50 MG PO CAPS: 50.0000 mg | ORAL_CAPSULE | Freq: Two times a day (BID) | ORAL | 0 refills | Status: DC

## 2017-11-14 NOTE — Progress Notes (Signed)
Location:    [facility] Room Number: 152/W Place of Service:  SNF [phone]) Provider:  Cory Roughen, MD  Patient Care Team: Celene Squibb, MD as PCP - General  Extended Emergency Contact Information Primary Emergency Contact: Nance,Sheila Address: [address]          [place], [place] [address] [place] Phone: [phone] Relation: Daughter Secondary Emergency Contact: Charlynne Pander, [place] [address] [place] Phone: [phone] Relation: Granddaughter  Code Status: DNR Goals of care: Advanced Directive information Advanced Directives 11/14/2017  Does Patient Have a Medical Advance Directive? Yes  Type of Advance Directive Out of facility DNR (pink MOST or yellow form)  Does patient want to make changes to medical advance directive? No - Patient declined  Copy of [place] in Chart? No - copy requested  Would patient like information on creating a medical advance directive? No - Patient declined  Pre-existing out of facility DNR order (yellow form or pink MOST form) -     Chief Complaint  Patient presents with  . Acute Visit    Patient is being seen for Erythema  And some edema and pain left hand  .   HPI:  Pt is a 84 y.o. female seen today for an acute visit for follow-up of some erythema and edema with pain of her left hand.  She was seen actually last week for some pain in her right lower arm and with erythema as well as her wrist area- we did treat this with a short course of prednisone also received doxycycline for concerns of possible cellulitis.  This appears to have resolved.  We also did lab work which was fairly unremarkable except showing an elevated RA factor of 29.6.  She now is developed some erythema and swelling of the back of her left hand this is affecting her proximal joints-there is tenderness with palpation appears somewhat similar to the  presentation on the other hand last week.  I think it would be highly unusual that this would be coexistent cellulitis-I suspect this may is more of an arthritic flare responded to the prednisone initially last week is is why the right extremity's presentation seems to be improved  She also received the doxycycline last week for concerns of some possible mild bronchitis he does have a history of COPD is on chronic oxygen- this appears to be baseline she is on Mucinex twice daily as well asTrelegy [employer] as needed-at this point appears to be stable does not appear to bein any discomfort does have some baseline mild congestion       Past Medical History:  Diagnosis Date  . Anemia   . Atrial fibrillation ([place])   . Back pain   . Bacterial pneumonia   . Cancer (HCC)    Skin   . Chronic respiratory failure ([place])   . Cognitive communication deficit   . COPD (chronic obstructive pulmonary disease) ([facility])   . Dementia   . Dementia   . Depression   . Diabetes mellitus without complication ([place])   . Diverticulosis   . Diverticulosis   . Fatty liver   . Fibromyalgia   . Fibromyalgia   . Gastric polyp 09/08/11   at the anastomosis-inflammatory  . Generalized anxiety disorder   . Generalized anxiety disorder   . GERD (gastroesophageal reflux disease)   . GERD (gastroesophageal  reflux disease)   . 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 Hypercholesteremia   . Hypertension   . Insomnia   . Internal hemorrhoids   . Muscle weakness   . Neuropathy   . On home O2    qhs  . Osteoporosis   . Rheumatoid arteritis   . Ulcer    stomach  . Vitamin D deficiency    Past Surgical History:  Procedure Laterality Date  . ABDOMINAL HYSTERECTOMY    . ABDOMINAL SURGERY     removed patial stomach from ulcer  . COLONOSCOPY  04-2001   internal hemorrhoids, panocolonic diverticulosis, and colon polyps   . UPPER GASTROINTESTINAL ENDOSCOPY      Allergies  Allergen Reactions  . Other      Band Aids   . Tape   . Lipitor [Atorvastatin Calcium] Rash and Other (See Comments)    Muscle pain  . Niaspan [Niacin Er] Rash and Other (See Comments)    Muscle pain    Outpatient Encounter Medications as of 11/14/2017  Medication Sig  . acetaminophen (TYLENOL) 325 MG tablet Take 650 mg by mouth every 8 (eight) hours as needed.  Marland Kitchen albuterol (PROAIR HFA) 108 (90 BASE) MCG/ACT inhaler Inhale 2 puffs into the lungs every 6 (six) hours as needed for wheezing or shortness of breath. For rescue   . apixaban (ELIQUIS) 2.5 MG TABS tablet Take 2.5 mg by mouth 2 (two) times daily.  Roseanne Kaufman Peru-Castor Oil (VENELEX) OINT Apply to sacrum and bilateral buttocks q shift and prn for prevention  . CARTIA XT 300 MG 24 hr capsule TAKE ONE CAPSULE BY MOUTH EVERY DAY  . donepezil (ARICEPT) 5 MG tablet Take 5 mg by mouth at bedtime.  Marland Kitchen doxycycline (VIBRAMYCIN) 100 MG capsule Take 100 mg by mouth 2 (two) times daily.  . DULoxetine (CYMBALTA) 60 MG capsule Take 60 mg by mouth daily.    . Fluticasone-Umeclidin-Vilant (TRELEGY ELLIPTA) 100-62.5-25 MCG/INH AEPB One puff inhalation once a day  . furosemide (LASIX) 40 MG tablet Take 40 mg by mouth daily.  Marland Kitchen guaiFENesin (MUCINEX) 600 MG 12 hr tablet Take 600 mg by mouth 2 (two) times daily. Take for 7 days from 11/08/2017-11/15/2017  . guaifenesin (ROBITUSSIN) 100 MG/5ML syrup Take 200 mg by mouth 4 (four) times daily as needed for cough.  Marland Kitchen HYDROcodone-acetaminophen (NORCO/VICODIN) 5-325 MG tablet Take 1 tablet by mouth daily.  Marland Kitchen ipratropium-albuterol (DUONEB) 0.5-2.5 (3) MG/3ML SOLN Take 3 mLs by nebulization 2 (two) times daily as needed.  . linagliptin (TRADJENTA) 5 MG TABS tablet Take 1 tablet (5 mg total) by mouth daily.  . Menthol, Topical Analgesic, (BIOFREEZE) 4 % GEL Apply to both knees daily as needed and to left shoulder  . Multiple Vitamin (DAILY VITE PO) Take 1 tablet by mouth daily.  . pantoprazole (PROTONIX) 40 MG tablet Take 1 tablet (40 mg total)  by mouth daily.  . phenylephrine-shark liver oil-mineral oil-petrolatum (PREPARATION H) 0.25-3-14-71.9 % rectal ointment Place 1 application rectally 2 (two) times daily as needed for hemorrhoids.  . polyethylene glycol (MIRALAX / GLYCOLAX) packet Take 17 g by mouth daily as needed for moderate constipation.  . potassium chloride SA (K-DUR,KLOR-CON) 20 MEQ tablet Take 2 tablets (40 mg ) by mouth once a day  . pregabalin (LYRICA) 50 MG capsule Take 1 capsule (50 mg total) by mouth 2 (two) times daily.  . Probiotic Product (RISA-BID PROBIOTIC PO) Take 1 tablet by mouth twice a day for 10 days from 11/08/2017-11/18/2017  .  Propylene Glycol (SYSTANE BALANCE) 0.6 % SOLN Place 1 drop into both eyes 2 (two) times daily.  . raloxifene (EVISTA) 60 MG tablet Take 60 mg by mouth daily.  . rosuvastatin (CRESTOR) 20 MG tablet Take 20 mg by mouth daily.  . [DISCONTINUED] predniSONE (DELTASONE) 10 MG tablet Take 10 mg by mouth daily with breakfast.   No facility-administered encounter medications on file as of 11/14/2017.     Review of Systems   General she is not complaining of any fever chills.  Skin is not complaining of rashes or itching again she does have the erythematous changes on the back of her left hand  Head ears eyes nose mouth and throat no complaints of sore throat or visual changes.  Resp- is not complaining of any increased shortness of breath or increased cough at this time she is on chronic oxygen with history of COPD  Cardiac is not complaining of chest pain has mild lower extremity edema.  GI is not complaining of abdominal discomfort nausea vomiting diarrhea constipation.  Musculoskeletal at times does complain of shoulder pain this is chronic does complain of some left hand pain with movement of her hand  Neurologic does not complain of feeling dizzy having headaches or being syncopal.  Psych does not complain of being depressed or anxious- does have a history of depression and  continues on Cymbalta but appears to be in good spirits in fact that I later saw her in the hallway with her daughter.        Immunization History  Administered Date(s) Administered  . Tdap 10/21/2016   Pertinent  Health Maintenance Due  Topic Date Due  . INFLUENZA VACCINE  12/08/2017 (Originally 10/12/2017)  . URINE MICROALBUMIN  12/08/2017 (Originally 06/05/1938)  . DEXA SCAN  12/08/2017 (Originally 06/06/1995)  . PNA vac Low Risk Adult (1 of 2 - PCV13) 12/08/2017 (Originally 06/06/1995)  . HEMOGLOBIN A1C  04/01/2018  . FOOT EXAM  06/22/2018  . OPHTHALMOLOGY EXAM  07/05/2018   Fall Risk  08/15/2017  Falls in the past year? No   Functional Status Survey:    --- She is afebrile pulse is 68 respirations 18 blood pressure 130/60  Physical Exam   General this is a pleasant elderly female in no distress sitting comfortably in her wheelchair   Her skin is warm and dry she does have some increased erythema at the back of her left hand as noted below   Oropharynx is clear mucous membranes moist.  Chest she has some scattered expiratory wheezing this appears somewhat better than last week again I suspect there is chronicity to this.  Heart is irregular irregular rate and rhythm somewhat distant heart sounds she has mild lower extremity edema.  Her abdomen is soft nontender with positive bowel sounds.  Musculoskeletal moves all extremities x4 at baseline with lower extremity weakness-.  The erythema and swelling of her right extremity appear resolved.  There is some mild edema of the back of her left hand and proximal joints- also some tenderness when she flexes her wrist--the back of the hand also is mildly erythematous and slightly warm.  Neurologic is grossly intact her speech is clear no true lateralizing findings.  Psych she is oriented to self pleasant appropriate.   Labs reviewed: Recent Labs    03/31/17 1852  05/03/17 0914  06/12/17 0727  07/11/17 0714   08/18/17 0726 09/29/17 0715 11/08/17 0706  NA 142   < > 136   < > 140   < >  --    < >  139 143 142  K 3.5   < > 2.8*   < > 3.3*   < >  --    < > 3.5 3.7 3.5  CL 102   < > 93*   < > 102   < >  --    < > 102 103 104  CO2 29   < > 29   < > 30   < >  --    < > 29 31 32  GLUCOSE 165*   < > 296*   < > 171*   < >  --    < > 154* 134* 125*  BUN 17   < > 22*   < > 14   < >  --    < > 18 18 14   CREATININE 1.29*   < > 1.60*   < > 1.19*   < >  --    < > 1.18* 1.13* 1.12*  CALCIUM 9.7   < > 9.3   < > 9.5   < >  --    < > 9.7 10.0 9.7  MG 2.0   < > 1.7  --  1.9  --  1.8  --   --   --   --   PHOS 3.0  --   --   --   --   --   --   --   --   --   --    < > = values in this interval not displayed.   Recent Labs    05/04/17 0452 09/29/17 0715 11/08/17 0706  AST 21 67* 18  ALT 14 61* 14  ALKPHOS 62 77 82  BILITOT 0.5 0.6 0.5  PROT 5.7* 6.5 5.9*  ALBUMIN 2.6* 3.5 3.0*   Recent Labs    07/11/17 0714 08/18/17 0726 11/08/17 0706  WBC 5.6 4.3 6.6  NEUTROABS 3.1 1.9 3.2  HGB 12.1 12.7 13.1  HCT 39.3 42.7 42.8  MCV 87.9 88.2 87.9  PLT 127* 108* 95*   Lab Results  Component Value Date   TSH 2.992 07/26/2016   Lab Results  Component Value Date   HGBA1C 6.4 (H) 09/29/2017   No results found for: CHOL, HDL, LDLCALC, LDLDIRECT, TRIG, CHOLHDL  Significant Diagnostic Results in last 30 days:  No results found.  Assessment/Plan  #1- history of now left hand erythema and tenderness and edema- I suspect this is more than likely an arthritic flare with her history of rheumatoid arthritis  Will do a 7-day course of prednisone 10 mg a day and monitor- consider possibly reinitiation of methotrexate- this was discontinued previously but may have to be restarted-her liver function tests are within normal limits.  She does not appear to be in any distress but hopefully the prednisone will help-monitor for resolution but this does not really appear to be recurrent cellulitis but will have to be  monitored.  2.-History of bronchitis-she did receive a course of doxycycline last week for this as well respiratory status appears stable she continues to have a bit of mild wheezing but I suspect this is baseline she continues on Mucinex as well asTrelegyy Ellipta and pro-air as needed in addition to DuoNeb's as needed  She appears stable at this time  #3 history of type 2 diabetes- despite being on the prednisone blood sugars appear to be stable largely in the lower mid 100s she is on Tradjenta  .  [id number] note greater than 25 minutes spent  assessing patient reviewing her chart and labs-discussing her status with Dr. Lyndel Safe- as well as with her daughter in the facility- and coordinating and formulating a plan of care- of note greater than 50% of time spent coordinating a plan of care with input as noted above

## 2017-11-14 NOTE — Telephone Encounter (Signed)
RX Fax for [facility]@ [phone]  

## 2017-11-18 ENCOUNTER — Encounter (HOSPITAL_COMMUNITY)
Admission: RE | Admit: 2017-11-18 | Discharge: 2017-11-18 | Disposition: A | Discharge status: Home / Self Care | Payer: Medicare Other | Source: Skilled Nursing Facility | Attending: Internal Medicine | Admitting: Internal Medicine

## 2017-11-18 DIAGNOSIS — I10 Essential (primary) hypertension: Secondary | ICD-10-CM | POA: Diagnosis not present

## 2017-11-18 DIAGNOSIS — E876 Hypokalemia: Secondary | ICD-10-CM | POA: Diagnosis not present

## 2017-11-18 DIAGNOSIS — F0390 Unspecified dementia without behavioral disturbance: Secondary | ICD-10-CM | POA: Diagnosis not present

## 2017-11-18 DIAGNOSIS — F411 Generalized anxiety disorder: Secondary | ICD-10-CM | POA: Insufficient documentation

## 2017-11-18 DIAGNOSIS — J189 Pneumonia, unspecified organism: Secondary | ICD-10-CM | POA: Insufficient documentation

## 2017-11-18 DIAGNOSIS — G9009 Other idiopathic peripheral autonomic neuropathy: Secondary | ICD-10-CM | POA: Insufficient documentation

## 2017-11-18 LAB — CBC WITH DIFFERENTIAL/PLATELET
BASOS PCT: 0 %
Basophils Absolute: 0 10*3/uL (ref 0.0–0.1)
EOS ABS: 0.1 10*3/uL (ref 0.0–0.7)
Eosinophils Relative: 1 %
HCT: 46.5 % — ABNORMAL HIGH (ref 36.0–46.0)
Hemoglobin: 14.8 g/dL (ref 12.0–15.0)
LYMPHS ABS: 3 10*3/uL (ref 0.7–4.0)
Lymphocytes Relative: 44 %
MCH: 27.3 pg (ref 26.0–34.0)
MCHC: 31.8 g/dL (ref 30.0–36.0)
MCV: 85.6 fL (ref 78.0–100.0)
MONO ABS: 0.7 10*3/uL (ref 0.1–1.0)
MONOS PCT: 10 %
Neutro Abs: 3.1 10*3/uL (ref 1.7–7.7)
Neutrophils Relative %: 45 %
Platelets: 118 10*3/uL — ABNORMAL LOW (ref 150–400)
RBC: 5.43 MIL/uL — ABNORMAL HIGH (ref 3.87–5.11)
RDW: 15.7 % — AB (ref 11.5–15.5)
WBC: 6.9 10*3/uL (ref 4.0–10.5)

## 2017-11-18 LAB — CREATININE, SERUM
CREATININE: 0.98 mg/dL (ref 0.44–1.00)
GFR calc Af Amer: 58 mL/min — ABNORMAL LOW (ref >60)
GFR, EST NON AFRICAN AMERICAN: 50 mL/min — AB (ref >60)

## 2017-11-19 ENCOUNTER — Encounter (HOSPITAL_COMMUNITY)
Admission: RE | Admit: 2017-11-19 | Discharge: 2017-11-19 | Disposition: A | Discharge status: Home / Self Care | Payer: Medicare Other | Source: Skilled Nursing Facility

## 2017-11-19 DIAGNOSIS — I10 Essential (primary) hypertension: Secondary | ICD-10-CM | POA: Diagnosis not present

## 2017-11-19 DIAGNOSIS — F0390 Unspecified dementia without behavioral disturbance: Secondary | ICD-10-CM | POA: Diagnosis not present

## 2017-11-19 DIAGNOSIS — E876 Hypokalemia: Secondary | ICD-10-CM | POA: Diagnosis not present

## 2017-11-19 DIAGNOSIS — J189 Pneumonia, unspecified organism: Secondary | ICD-10-CM | POA: Diagnosis not present

## 2017-11-19 DIAGNOSIS — G9009 Other idiopathic peripheral autonomic neuropathy: Secondary | ICD-10-CM | POA: Diagnosis not present

## 2017-11-19 DIAGNOSIS — F411 Generalized anxiety disorder: Secondary | ICD-10-CM | POA: Diagnosis not present

## 2017-11-19 LAB — CBC WITH DIFFERENTIAL/PLATELET
BASOS ABS: 0 10*3/uL (ref 0.0–0.1)
Basophils Relative: 0 %
EOS ABS: 0.1 10*3/uL (ref 0.0–0.7)
EOS PCT: 1 %
HCT: 45.1 % (ref 36.0–46.0)
Hemoglobin: 14.2 g/dL (ref 12.0–15.0)
Lymphocytes Relative: 41 %
Lymphs Abs: 3.3 10*3/uL (ref 0.7–4.0)
MCH: 27.2 pg (ref 26.0–34.0)
MCHC: 31.5 g/dL (ref 30.0–36.0)
MCV: 86.4 fL (ref 78.0–100.0)
Monocytes Absolute: 0.8 10*3/uL (ref 0.1–1.0)
Monocytes Relative: 9 %
NEUTROS PCT: 49 %
Neutro Abs: 4 10*3/uL (ref 1.7–7.7)
Platelets: 136 10*3/uL — ABNORMAL LOW (ref 150–400)
RBC: 5.22 MIL/uL — AB (ref 3.87–5.11)
RDW: 15.5 % (ref 11.5–15.5)
WBC: 8.3 10*3/uL (ref 4.0–10.5)

## 2017-11-19 LAB — CREATININE, SERUM
CREATININE: 1.07 mg/dL — AB (ref 0.44–1.00)
GFR, EST AFRICAN AMERICAN: 53 mL/min — AB (ref >60)
GFR, EST NON AFRICAN AMERICAN: 45 mL/min — AB (ref >60)

## 2017-11-23 ENCOUNTER — Encounter (HOSPITAL_COMMUNITY)
Admission: RE | Admit: 2017-11-23 | Discharge: 2017-11-23 | Disposition: A | Discharge status: Home / Self Care | Payer: Medicare Other | Source: Skilled Nursing Facility | Attending: Internal Medicine | Admitting: Internal Medicine

## 2017-11-23 ENCOUNTER — Non-Acute Institutional Stay (SKILLED_NURSING_FACILITY): Payer: Medicare Other | Admitting: Internal Medicine

## 2017-11-23 ENCOUNTER — Encounter: Payer: Self-pay | Admitting: Internal Medicine

## 2017-11-23 DIAGNOSIS — E785 Hyperlipidemia, unspecified: Secondary | ICD-10-CM | POA: Diagnosis not present

## 2017-11-23 DIAGNOSIS — N189 Chronic kidney disease, unspecified: Secondary | ICD-10-CM | POA: Insufficient documentation

## 2017-11-23 DIAGNOSIS — M05741 Rheumatoid arthritis with rheumatoid factor of right hand without organ or systems involvement: Secondary | ICD-10-CM

## 2017-11-23 DIAGNOSIS — J449 Chronic obstructive pulmonary disease, unspecified: Secondary | ICD-10-CM | POA: Insufficient documentation

## 2017-11-23 DIAGNOSIS — I129 Hypertensive chronic kidney disease with stage 1 through stage 4 chronic kidney disease, or unspecified chronic kidney disease: Secondary | ICD-10-CM | POA: Insufficient documentation

## 2017-11-23 LAB — COMPREHENSIVE METABOLIC PANEL
ALK PHOS: 87 U/L (ref 38–126)
ALT: 43 U/L (ref 0–44)
ANION GAP: 8 (ref 5–15)
AST: 43 U/L — AB (ref 15–41)
Albumin: 3.2 g/dL — ABNORMAL LOW (ref 3.5–5.0)
BILIRUBIN TOTAL: 0.6 mg/dL (ref 0.3–1.2)
BUN: 21 mg/dL (ref 8–23)
CO2: 29 mmol/L (ref 22–32)
Calcium: 9.5 mg/dL (ref 8.9–10.3)
Chloride: 104 mmol/L (ref 98–111)
Creatinine, Ser: 1.13 mg/dL — ABNORMAL HIGH (ref 0.44–1.00)
GFR calc Af Amer: 49 mL/min — ABNORMAL LOW (ref >60)
GFR calc non Af Amer: 42 mL/min — ABNORMAL LOW (ref >60)
GLUCOSE: 132 mg/dL — AB (ref 70–99)
POTASSIUM: 3.3 mmol/L — AB (ref 3.5–5.1)
Sodium: 141 mmol/L (ref 135–145)
TOTAL PROTEIN: 6.3 g/dL — AB (ref 6.5–8.1)

## 2017-11-23 LAB — URIC ACID: Uric Acid, Serum: 3.9 mg/dL (ref 2.5–7.1)

## 2017-11-23 NOTE — Progress Notes (Signed)
Location:    [facility] Room Number: 152/W Place of Service:  SNF [phone]) Provider:  Veleta Miners MD  Celene Squibb, MD  Patient Care Team: Celene Squibb, MD as PCP - General  Extended Emergency Contact Information Primary Emergency Contact: Nance,Sheila Address: [address]          [place], [place] [address] [place] Phone: [phone] Relation: Daughter Secondary Emergency Contact: Charlynne Pander, [place] [address] [place] Phone: [phone] Relation: Granddaughter  Code Status:  DNR Goals of care: Advanced Directive information Advanced Directives 11/23/2017  Does Patient Have a Medical Advance Directive? Yes  Type of Advance Directive Out of facility DNR (pink MOST or yellow form)  Does patient want to make changes to medical advance directive? No - Patient declined  Copy of [place] in Chart? No - copy requested  Would patient like information on creating a medical advance directive? No - Patient declined  Pre-existing out of facility DNR order (yellow form or pink MOST form) -     Chief Complaint  Patient presents with  . Acute Visit    Patients c/o Hand red and swollen    HPI:  Pt is a 84 y.o. female seen today for an acute visit for Swelling of Right Hand and Redness. Patient has h/o COPD, Chronic Atrial Fibrillation on Eliquis, Rheumatoid arthritis, Fibromyalgia, Peripheral Neuropathy, Hyperlipidemia, PUD, and anxiety disorder Patient was seen today for some swelling and redness of her right hand. Patient has h/o Rheumatoid Arthritis and was on Methotrexate which was stopped in 07/19 as there was some concern if she still requires to be on it. But since then patient has had 2 episodes of swelling and redness of her Right Hand. Her Rheumatoid Factor is positive and she has responded well to low dose of Prednisone. Patient unable to give detail history.  She did c/o Pain  the Hand.  Past Medical History:  Diagnosis Date  . Anemia   . Atrial fibrillation ([place])   . Back pain   . Bacterial pneumonia   . Cancer (HCC)    Skin   . Chronic respiratory failure ([place])   . Cognitive communication deficit   . COPD (chronic obstructive pulmonary disease) ([place])   . Dementia   . Dementia   . Depression   . Diabetes mellitus without complication ([place])   . Diverticulosis   . Diverticulosis   . Fatty liver   . Fibromyalgia   . Fibromyalgia   . Gastric polyp 09/08/11   at the anastomosis-inflammatory  . Generalized anxiety disorder   . Generalized anxiety disorder   . GERD (gastroesophageal reflux disease)   . GERD (gastroesophageal reflux disease)   . 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 Hypercholesteremia   . Hypertension   . Insomnia   . Internal hemorrhoids   . Muscle weakness   . Neuropathy   . On home O2    qhs  . Osteoporosis   . Rheumatoid arteritis   . Ulcer    stomach  . Vitamin D deficiency    Past Surgical History:  Procedure Laterality Date  . ABDOMINAL HYSTERECTOMY    . ABDOMINAL SURGERY     removed patial stomach from ulcer  . COLONOSCOPY  04-2001   internal hemorrhoids, panocolonic diverticulosis, and colon polyps   . UPPER GASTROINTESTINAL ENDOSCOPY      Allergies  Allergen Reactions  .  Other     Band Aids   . Tape   . Lipitor [Atorvastatin Calcium] Rash and Other (See Comments)    Muscle pain  . Niaspan [Niacin Er] Rash and Other (See Comments)    Muscle pain    Outpatient Encounter Medications as of 11/23/2017  Medication Sig  . acetaminophen (TYLENOL) 325 MG tablet Take 650 mg by mouth every 8 (eight) hours as needed.  Marland Kitchen albuterol (PROAIR HFA) 108 (90 BASE) MCG/ACT inhaler Inhale 2 puffs into the lungs every 6 (six) hours as needed for wheezing or shortness of breath. For rescue   . apixaban (ELIQUIS) 2.5 MG TABS tablet Take 2.5 mg by mouth 2 (two) times daily.  Roseanne Kaufman Peru-Castor Oil (VENELEX) OINT Apply to  sacrum and bilateral buttocks q shift and prn for prevention  . CARTIA XT 300 MG 24 hr capsule TAKE ONE CAPSULE BY MOUTH EVERY DAY  . donepezil (ARICEPT) 5 MG tablet Take 5 mg by mouth at bedtime.  . DULoxetine (CYMBALTA) 60 MG capsule Take 60 mg by mouth daily.    . Fluticasone-Umeclidin-Vilant (TRELEGY ELLIPTA) 100-62.5-25 MCG/INH AEPB One puff inhalation once a day  . furosemide (LASIX) 40 MG tablet Take 40 mg by mouth daily.  Marland Kitchen guaifenesin (ROBITUSSIN) 100 MG/5ML syrup Take 200 mg by mouth 4 (four) times daily as needed for cough.  Marland Kitchen HYDROcodone-acetaminophen (NORCO/VICODIN) 5-325 MG tablet Take 1 tablet by mouth daily.  Marland Kitchen ipratropium-albuterol (DUONEB) 0.5-2.5 (3) MG/3ML SOLN Take 3 mLs by nebulization 2 (two) times daily as needed.  . linagliptin (TRADJENTA) 5 MG TABS tablet Take 1 tablet (5 mg total) by mouth daily.  . Menthol, Topical Analgesic, (BIOFREEZE) 4 % GEL Apply to both knees daily as needed and to left shoulder  . Multiple Vitamin (DAILY VITE PO) Take 1 tablet by mouth daily.  . pantoprazole (PROTONIX) 40 MG tablet Take 1 tablet (40 mg total) by mouth daily.  . phenylephrine-shark liver oil-mineral oil-petrolatum (PREPARATION H) 0.25-3-14-71.9 % rectal ointment Place 1 application rectally 2 (two) times daily as needed for hemorrhoids.  . polyethylene glycol (MIRALAX / GLYCOLAX) packet Take 17 g by mouth daily as needed for moderate constipation.  . potassium chloride SA (K-DUR,KLOR-CON) 20 MEQ tablet Take 2 tablets (40 mg ) by mouth once a day  . pregabalin (LYRICA) 50 MG capsule Take 1 capsule (50 mg total) by mouth 2 (two) times daily.  Marland Kitchen Propylene Glycol (SYSTANE BALANCE) 0.6 % SOLN Place 1 drop into both eyes 2 (two) times daily.  . raloxifene (EVISTA) 60 MG tablet Take 60 mg by mouth daily.  . rosuvastatin (CRESTOR) 20 MG tablet Take 20 mg by mouth daily.  . [DISCONTINUED] doxycycline (VIBRAMYCIN) 100 MG capsule Take 100 mg by mouth 2 (two) times daily.  . [DISCONTINUED]  guaiFENesin (MUCINEX) 600 MG 12 hr tablet Take 600 mg by mouth 2 (two) times daily. Take for 7 days from 11/08/2017-11/15/2017  . [DISCONTINUED] Probiotic Product (RISA-BID PROBIOTIC PO) Take 1 tablet by mouth twice a day for 10 days from 11/08/2017-11/18/2017   No facility-administered encounter medications on file as of 11/23/2017.      Review of Systems  Constitutional: Positive for activity change and appetite change.  HENT: Negative.   Respiratory: Positive for chest tightness and shortness of breath.   Cardiovascular: Positive for leg swelling.  Gastrointestinal: Negative.   Genitourinary: Negative.   Musculoskeletal: Positive for arthralgias.  Neurological: Negative.   Psychiatric/Behavioral: Negative.     Immunization History  Administered Date(s) Administered  .  Tdap 10/21/2016   Pertinent  Health Maintenance Due  Topic Date Due  . INFLUENZA VACCINE  12/08/2017 (Originally 10/12/2017)  . URINE MICROALBUMIN  12/08/2017 (Originally 06/05/1938)  . DEXA SCAN  12/08/2017 (Originally 06/06/1995)  . PNA vac Low Risk Adult (1 of 2 - PCV13) 12/08/2017 (Originally 06/06/1995)  . HEMOGLOBIN A1C  04/01/2018  . FOOT EXAM  06/22/2018  . OPHTHALMOLOGY EXAM  07/05/2018   Fall Risk  08/15/2017  Falls in the past year? No   Functional Status Survey:    Vitals:   11/23/17 1130  BP: (!) 142/70  Pulse: 76  Resp: 18  Temp: 98.4 F (36.9 C)  TempSrc: Oral   There is no height or weight on file to calculate BMI. Physical Exam  Constitutional: She appears well-developed and well-nourished.  HENT:  Head: Normocephalic.  Mouth/Throat: Oropharynx is clear and moist.  Eyes: Pupils are equal, round, and reactive to light.  Neck: Neck supple.  Cardiovascular: Normal rate. An irregular rhythm present.  Murmur heard. Pulmonary/Chest: Effort normal and breath sounds normal. No stridor. No respiratory distress. She has no wheezes.  Abdominal: Soft. Bowel sounds are normal. She exhibits no  distension. There is no tenderness. There is no guarding.  Musculoskeletal:  Mild edema Bilateral Patient Right Hand has swelling in Proximal Metacarpals with Tenderness and Redness  Lymphadenopathy:    She has no cervical adenopathy.  Neurological: She is alert.  Not oriented. No Focal deficits. Cannot move her Left UE due to Frozen shoulder  Skin: Skin is warm.  Psychiatric: She has a normal mood and affect. Her behavior is normal. Thought content normal.    Labs reviewed: Recent Labs    03/31/17 1852  05/03/17 0914  06/12/17 0727  07/11/17 0714  09/29/17 0715 11/08/17 0706 11/18/17 0931 11/19/17 0900 11/23/17 0731  NA 142   < > 136   < > 140   < >  --    < > 143 142  --   --  141  K 3.5   < > 2.8*   < > 3.3*   < >  --    < > 3.7 3.5  --   --  3.3*  CL 102   < > 93*   < > 102   < >  --    < > 103 104  --   --  104  CO2 29   < > 29   < > 30   < >  --    < > 31 32  --   --  29  GLUCOSE 165*   < > 296*   < > 171*   < >  --    < > 134* 125*  --   --  132*  BUN 17   < > 22*   < > 14   < >  --    < > 18 14  --   --  21  CREATININE 1.29*   < > 1.60*   < > 1.19*   < >  --    < > 1.13* 1.12* 0.98 1.07* 1.13*  CALCIUM 9.7   < > 9.3   < > 9.5   < >  --    < > 10.0 9.7  --   --  9.5  MG 2.0   < > 1.7  --  1.9  --  1.8  --   --   --   --   --   --  PHOS 3.0  --   --   --   --   --   --   --   --   --   --   --   --    < > = values in this interval not displayed.   Recent Labs    09/29/17 0715 11/08/17 0706 11/23/17 0731  AST 67* 18 43*  ALT 61* 14 43  ALKPHOS 77 82 87  BILITOT 0.6 0.5 0.6  PROT 6.5 5.9* 6.3*  ALBUMIN 3.5 3.0* 3.2*   Recent Labs    11/08/17 0706 11/18/17 0931 11/19/17 0900  WBC 6.6 6.9 8.3  NEUTROABS 3.2 3.1 4.0  HGB 13.1 14.8 14.2  HCT 42.8 46.5* 45.1  MCV 87.9 85.6 86.4  PLT 95* 118* 136*   Lab Results  Component Value Date   TSH 2.992 07/26/2016   Lab Results  Component Value Date   HGBA1C 6.4 (H) 09/29/2017   No results found for: CHOL,  HDL, LDLCALC, LDLDIRECT, TRIG, CHOLHDL  Significant Diagnostic Results in last 30 days:  No results found.  Assessment/Plan  Acute Inflammatory Rheumatoid Arthritis with Rheumatoid factor Positive Will start Back on Prednisone 10 mg QD for 7 days  and also restart on Methotrexate 12.5 Mg PO Q Weekly. Will Monitor Liver function and CBC as Recommended by Pharmacy Xray of the Hand has been negative    Family/ staff Communication:   Labs/tests ordered:

## 2017-11-27 ENCOUNTER — Encounter (HOSPITAL_COMMUNITY)
Admission: RE | Admit: 2017-11-27 | Discharge: 2017-11-27 | Disposition: A | Discharge status: Home / Self Care | Payer: Medicare Other | Source: Skilled Nursing Facility | Attending: *Deleted | Admitting: *Deleted

## 2017-11-27 DIAGNOSIS — G9009 Other idiopathic peripheral autonomic neuropathy: Secondary | ICD-10-CM | POA: Diagnosis not present

## 2017-11-27 DIAGNOSIS — F411 Generalized anxiety disorder: Secondary | ICD-10-CM | POA: Diagnosis not present

## 2017-11-27 DIAGNOSIS — I10 Essential (primary) hypertension: Secondary | ICD-10-CM | POA: Diagnosis not present

## 2017-11-27 DIAGNOSIS — J189 Pneumonia, unspecified organism: Secondary | ICD-10-CM | POA: Diagnosis not present

## 2017-11-27 DIAGNOSIS — F0390 Unspecified dementia without behavioral disturbance: Secondary | ICD-10-CM | POA: Diagnosis not present

## 2017-11-27 DIAGNOSIS — E876 Hypokalemia: Secondary | ICD-10-CM | POA: Diagnosis not present

## 2017-11-27 LAB — BASIC METABOLIC PANEL
ANION GAP: 6 (ref 5–15)
BUN: 19 mg/dL (ref 8–23)
CHLORIDE: 107 mmol/L (ref 98–111)
CO2: 28 mmol/L (ref 22–32)
Calcium: 9.6 mg/dL (ref 8.9–10.3)
Creatinine, Ser: 1.01 mg/dL — ABNORMAL HIGH (ref 0.44–1.00)
GFR calc Af Amer: 56 mL/min — ABNORMAL LOW (ref >60)
GFR calc non Af Amer: 49 mL/min — ABNORMAL LOW (ref >60)
GLUCOSE: 123 mg/dL — AB (ref 70–99)
POTASSIUM: 4 mmol/L (ref 3.5–5.1)
Sodium: 141 mmol/L (ref 135–145)

## 2017-12-07 ENCOUNTER — Encounter (HOSPITAL_COMMUNITY)
Admission: RE | Admit: 2017-12-07 | Discharge: 2017-12-07 | Disposition: A | Discharge status: Home / Self Care | Payer: Medicare Other | Source: Skilled Nursing Facility | Attending: Internal Medicine | Admitting: Internal Medicine

## 2017-12-07 DIAGNOSIS — J449 Chronic obstructive pulmonary disease, unspecified: Secondary | ICD-10-CM | POA: Insufficient documentation

## 2017-12-07 LAB — BASIC METABOLIC PANEL
Anion gap: 8 (ref 5–15)
BUN: 14 mg/dL (ref 8–23)
CHLORIDE: 105 mmol/L (ref 98–111)
CO2: 28 mmol/L (ref 22–32)
Calcium: 9.6 mg/dL (ref 8.9–10.3)
Creatinine, Ser: 1.1 mg/dL — ABNORMAL HIGH (ref 0.44–1.00)
GFR calc non Af Amer: 44 mL/min — ABNORMAL LOW (ref >60)
GFR, EST AFRICAN AMERICAN: 51 mL/min — AB (ref >60)
Glucose, Bld: 147 mg/dL — ABNORMAL HIGH (ref 70–99)
Potassium: 3.4 mmol/L — ABNORMAL LOW (ref 3.5–5.1)
Sodium: 141 mmol/L (ref 135–145)

## 2017-12-08 ENCOUNTER — Other Ambulatory Visit: Payer: Self-pay

## 2017-12-08 MED ORDER — PREGABALIN 50 MG PO CAPS: 50.0000 mg | ORAL_CAPSULE | Freq: Two times a day (BID) | ORAL | 0 refills | Status: DC

## 2017-12-08 NOTE — Telephone Encounter (Signed)
RX Fax for [facility]@ [phone]  

## 2017-12-14 ENCOUNTER — Encounter (HOSPITAL_COMMUNITY)
Admission: RE | Admit: 2017-12-14 | Discharge: 2017-12-14 | Disposition: A | Discharge status: Home / Self Care | Payer: Medicare Other | Source: Skilled Nursing Facility | Attending: Internal Medicine | Admitting: Internal Medicine

## 2017-12-14 DIAGNOSIS — E785 Hyperlipidemia, unspecified: Secondary | ICD-10-CM | POA: Diagnosis not present

## 2017-12-14 DIAGNOSIS — I129 Hypertensive chronic kidney disease with stage 1 through stage 4 chronic kidney disease, or unspecified chronic kidney disease: Secondary | ICD-10-CM | POA: Insufficient documentation

## 2017-12-14 DIAGNOSIS — J449 Chronic obstructive pulmonary disease, unspecified: Secondary | ICD-10-CM | POA: Diagnosis not present

## 2017-12-14 DIAGNOSIS — E876 Hypokalemia: Secondary | ICD-10-CM | POA: Diagnosis not present

## 2017-12-14 LAB — BASIC METABOLIC PANEL
Anion gap: 6 (ref 5–15)
BUN: 15 mg/dL (ref 8–23)
CALCIUM: 9.6 mg/dL (ref 8.9–10.3)
CO2: 28 mmol/L (ref 22–32)
CREATININE: 1.16 mg/dL — AB (ref 0.44–1.00)
Chloride: 107 mmol/L (ref 98–111)
GFR calc Af Amer: 48 mL/min — ABNORMAL LOW (ref >60)
GFR calc non Af Amer: 41 mL/min — ABNORMAL LOW (ref >60)
Glucose, Bld: 132 mg/dL — ABNORMAL HIGH (ref 70–99)
Potassium: 4.1 mmol/L (ref 3.5–5.1)
SODIUM: 141 mmol/L (ref 135–145)

## 2017-12-25 ENCOUNTER — Encounter: Payer: Self-pay | Admitting: Internal Medicine

## 2017-12-25 NOTE — Progress Notes (Signed)
This encounter was created in error - please disregard.

## 2017-12-27 DIAGNOSIS — L603 Nail dystrophy: Secondary | ICD-10-CM | POA: Diagnosis not present

## 2017-12-27 DIAGNOSIS — Z794 Long term (current) use of insulin: Secondary | ICD-10-CM | POA: Diagnosis not present

## 2017-12-27 DIAGNOSIS — E1151 Type 2 diabetes mellitus with diabetic peripheral angiopathy without gangrene: Secondary | ICD-10-CM | POA: Diagnosis not present

## 2017-12-28 ENCOUNTER — Non-Acute Institutional Stay (SKILLED_NURSING_FACILITY): Payer: Medicare Other | Admitting: Internal Medicine

## 2017-12-28 ENCOUNTER — Encounter: Payer: Self-pay | Admitting: Internal Medicine

## 2017-12-28 DIAGNOSIS — K219 Gastro-esophageal reflux disease without esophagitis: Secondary | ICD-10-CM

## 2017-12-28 DIAGNOSIS — I5032 Chronic diastolic (congestive) heart failure: Secondary | ICD-10-CM | POA: Diagnosis not present

## 2017-12-28 DIAGNOSIS — E1140 Type 2 diabetes mellitus with diabetic neuropathy, unspecified: Secondary | ICD-10-CM

## 2017-12-28 DIAGNOSIS — F0390 Unspecified dementia without behavioral disturbance: Secondary | ICD-10-CM | POA: Diagnosis not present

## 2017-12-28 DIAGNOSIS — M0520 Rheumatoid vasculitis with rheumatoid arthritis of unspecified site: Secondary | ICD-10-CM | POA: Diagnosis not present

## 2017-12-28 DIAGNOSIS — I4891 Unspecified atrial fibrillation: Secondary | ICD-10-CM | POA: Diagnosis not present

## 2017-12-28 NOTE — Progress Notes (Signed)
Location:    [facility] Room Number: 152/W Place of Service:  SNF [id number]) Provider:  Veleta Miners MD  Celene Squibb, MD  Patient Care Team: Celene Squibb, MD as PCP - General  Extended Emergency Contact Information Primary Emergency Contact: Nance,Sheila Address: [address]          [place], [place] [address] [place] Phone: [phone] Relation: Daughter Secondary Emergency Contact: Charlynne Pander, [place] [address] [place] Phone: [phone] Relation: Granddaughter  Code Status:  DNR Goals of care: Advanced Directive information Advanced Directives 12/28/2017  Does Patient Have a Medical Advance Directive? Yes  Type of Advance Directive Out of facility DNR (pink MOST or yellow form)  Does patient want to make changes to medical advance directive? No - Patient declined  Copy of [place] in Chart? No - copy requested  Would patient like information on creating a medical advance directive? No - Patient declined  Pre-existing out of facility DNR order (yellow form or pink MOST form) -     Chief Complaint  Patient presents with  . Medical Management of Chronic Issues    [place] visit of medical management,     HPI:  Pt is a 84 y.o. female seen today for medical management of chronic diseases.    Patient has h/o COPD, Chronic Atrial Fibrillation on Eliquis, Rheumatoid arthritis, Fibromyalgia, Peripheral Neuropathy, Hyperlipidemia, PUD, and anxiety disorder.  Patient has been Stable in the facility. We were trying to take her off the Methotrexate but then she had the Arthritis Flare and she was restarted on it. Since then she has not had any more active arthritis. Her weight is stable at 192 lbs. No New Nursing issues. She continues to have some Chronic Pain. Her BS are less then 150. Has some cough but no SOB. Past Medical History:  Diagnosis Date  . Anemia   . Atrial  fibrillation ([place])   . Back pain   . Bacterial pneumonia   . Cancer (HCC)    Skin   . Chronic respiratory failure ([place])   . Cognitive communication deficit   . COPD (chronic obstructive pulmonary disease) ([place])   . Dementia ([place])   . Dementia ([place])   . Depression   . Diabetes mellitus without complication ([place])   . Diverticulosis   . Diverticulosis   . Fatty liver   . Fibromyalgia   . Fibromyalgia   . Gastric polyp 09/08/11   at the anastomosis-inflammatory  . Generalized anxiety disorder   . Generalized anxiety disorder   . GERD (gastroesophageal reflux disease)   . GERD (gastroesophageal reflux disease)   . 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 Hypercholesteremia   . Hypertension   . Insomnia   . Internal hemorrhoids   . Muscle weakness   . Neuropathy   . On home O2    qhs  . Osteoporosis   . Rheumatoid arteritis ([place])   . Ulcer    stomach  . Vitamin D deficiency    Past Surgical History:  Procedure Laterality Date  . ABDOMINAL HYSTERECTOMY    . ABDOMINAL SURGERY     removed patial stomach from ulcer  . COLONOSCOPY  04-2001   internal hemorrhoids, panocolonic diverticulosis, and colon polyps   . UPPER GASTROINTESTINAL ENDOSCOPY      Allergies  Allergen Reactions  . Other     Band Aids   .  Tape   . Lipitor [Atorvastatin Calcium] Rash and Other (See Comments)    Muscle pain  . Niaspan [Niacin Er] Rash and Other (See Comments)    Muscle pain    Outpatient Encounter Medications as of 12/28/2017  Medication Sig  . acetaminophen (TYLENOL) 325 MG tablet Take 650 mg by mouth every 8 (eight) hours as needed.  Marland Kitchen albuterol (PROAIR HFA) 108 (90 BASE) MCG/ACT inhaler Inhale 2 puffs into the lungs every 6 (six) hours as needed for wheezing or shortness of breath. For rescue   . apixaban (ELIQUIS) 2.5 MG TABS tablet Take 2.5 mg by mouth 2 (two) times daily.  Roseanne Kaufman Peru-Castor Oil (VENELEX) OINT Apply to sacrum and bilateral buttocks q shift and prn for  prevention  . CARTIA XT 300 MG 24 hr capsule TAKE ONE CAPSULE BY MOUTH EVERY DAY  . donepezil (ARICEPT) 5 MG tablet Take 5 mg by mouth at bedtime.  . DULoxetine (CYMBALTA) 60 MG capsule Take 60 mg by mouth daily.    . Fluticasone-Umeclidin-Vilant (TRELEGY ELLIPTA) 100-62.5-25 MCG/INH AEPB One puff inhalation once a day  . furosemide (LASIX) 40 MG tablet Take 40 mg by mouth daily.  Marland Kitchen guaifenesin (ROBITUSSIN) 100 MG/5ML syrup Take 200 mg by mouth 4 (four) times daily as needed for cough.  Marland Kitchen HYDROcodone-acetaminophen (NORCO/VICODIN) 5-325 MG tablet Take 1 tablet by mouth daily.  Marland Kitchen ipratropium-albuterol (DUONEB) 0.5-2.5 (3) MG/3ML SOLN Take 3 mLs by nebulization 2 (two) times daily as needed.  . linagliptin (TRADJENTA) 5 MG TABS tablet Take 1 tablet (5 mg total) by mouth daily.  . Menthol, Topical Analgesic, (BIOFREEZE) 4 % GEL Apply to both knees daily as needed and to left shoulder  . methotrexate (RHEUMATREX) 2.5 MG tablet Take 12.5 mg by mouth once a week. Caution:Chemotherapy. Protect from light.  . Multiple Vitamin (DAILY VITE PO) Take 1 tablet by mouth daily.  . pantoprazole (PROTONIX) 40 MG tablet Take 1 tablet (40 mg total) by mouth daily.  . phenylephrine-shark liver oil-mineral oil-petrolatum (PREPARATION H) 0.25-3-14-71.9 % rectal ointment Place 1 application rectally 2 (two) times daily as needed for hemorrhoids.  . polyethylene glycol (MIRALAX / GLYCOLAX) packet Take 17 g by mouth daily as needed for moderate constipation.  . potassium chloride SA (K-DUR,KLOR-CON) 20 MEQ tablet Take 2 tablets (40 mg ) by mouth twice a day  . pregabalin (LYRICA) 50 MG capsule Take 1 capsule (50 mg total) by mouth 2 (two) times daily.  Marland Kitchen Propylene Glycol (SYSTANE BALANCE) 0.6 % SOLN Place 1 drop into both eyes 2 (two) times daily.  . raloxifene (EVISTA) 60 MG tablet Take 60 mg by mouth daily.  . rosuvastatin (CRESTOR) 20 MG tablet Take 20 mg by mouth daily.  . simethicone (MYLICON) 80 MG chewable tablet  Chew 80 mg by mouth 2 (two) times daily as needed for flatulence.   No facility-administered encounter medications on file as of 12/28/2017.      Review of Systems  Constitutional: Negative.   HENT: Negative.   Respiratory: Positive for cough. Negative for shortness of breath.   Cardiovascular: Negative.   Gastrointestinal: Negative.   Genitourinary: Negative.   Musculoskeletal: Positive for myalgias.  Skin: Negative.   Neurological: Negative.   Psychiatric/Behavioral: Negative.     Immunization History  Administered Date(s) Administered  . Tdap 10/21/2016   Pertinent  Health Maintenance Due  Topic Date Due  . INFLUENZA VACCINE  12/28/2017 (Originally 10/12/2017)  . URINE MICROALBUMIN  01/28/2018 (Originally 06/05/1938)  . DEXA SCAN  01/28/2018 (Originally 06/06/1995)  . PNA vac Low Risk Adult (1 of 2 - PCV13) 12/29/2018 (Originally 06/06/1995)  . HEMOGLOBIN A1C  04/01/2018  . FOOT EXAM  06/22/2018  . OPHTHALMOLOGY EXAM  07/05/2018   Fall Risk  08/15/2017  Falls in the past year? No   Functional Status Survey:    There were no vitals filed for this visit. There is no height or weight on file to calculate BMI. Physical Exam  Constitutional: She appears well-developed and well-nourished.  HENT:  Head: Normocephalic.  Mouth/Throat: Oropharynx is clear and moist.  Eyes: Pupils are equal, round, and reactive to light.  Neck: Neck supple.  Cardiovascular: Normal rate. An irregular rhythm present.  Murmur heard. Pulmonary/Chest: Effort normal and breath sounds normal. No stridor. No respiratory distress. She has no wheezes.  Abdominal: Soft. Bowel sounds are normal. She exhibits no distension. There is no tenderness. There is no guarding.  Musculoskeletal:  Mild edema Bilateral Patient Right Hand has swelling in Proximal Metacarpals with Tenderness and Redness  Lymphadenopathy:    She has no cervical adenopathy.  Neurological: She is alert.  Not oriented. No Focal  deficits. Cannot move her Left UE due to Frozen shoulder She follows most of the Commands. Stays in [employer] .  Skin: Skin is warm.  Psychiatric: She has a normal mood and affect. Her behavior is normal. Thought content normal.    Labs reviewed: Recent Labs    03/31/17 1852  05/03/17 0914  06/12/17 0727  07/11/17 0714  11/27/17 0600 12/07/17 0700 12/14/17 0700  NA 142   < > 136   < > 140   < >  --    < > 141 141 141  K 3.5   < > 2.8*   < > 3.3*   < >  --    < > 4.0 3.4* 4.1  CL 102   < > 93*   < > 102   < >  --    < > 107 105 107  CO2 29   < > 29   < > 30   < >  --    < > 28 28 28   GLUCOSE 165*   < > 296*   < > 171*   < >  --    < > 123* 147* 132*  BUN 17   < > 22*   < > 14   < >  --    < > 19 14 15   CREATININE 1.29*   < > 1.60*   < > 1.19*   < >  --    < > 1.01* 1.10* 1.16*  CALCIUM 9.7   < > 9.3   < > 9.5   < >  --    < > 9.6 9.6 9.6  MG 2.0   < > 1.7  --  1.9  --  1.8  --   --   --   --   PHOS 3.0  --   --   --   --   --   --   --   --   --   --    < > = values in this interval not displayed.   Recent Labs    09/29/17 0715 11/08/17 0706 11/23/17 0731  AST 67* 18 43*  ALT 61* 14 43  ALKPHOS 77 82 87  BILITOT 0.6 0.5 0.6  PROT 6.5 5.9* 6.3*  ALBUMIN 3.5 3.0* 3.2*   Recent  Labs    11/08/17 0706 11/18/17 0931 11/19/17 0900  WBC 6.6 6.9 8.3  NEUTROABS 3.2 3.1 4.0  HGB 13.1 14.8 14.2  HCT 42.8 46.5* 45.1  MCV 87.9 85.6 86.4  PLT 95* 118* 136*   Lab Results  Component Value Date   TSH 2.992 07/26/2016   Lab Results  Component Value Date   HGBA1C 6.4 (H) 09/29/2017   No results found for: CHOL, HDL, LDLCALC, LDLDIRECT, TRIG, CHOLHDL  Significant Diagnostic Results in last 30 days:  No results found.  Assessment/Plan Diastolic CHF Patient is doing well. Her weight is stable On Low dose Lasix. BMP Stable Her Repeat Chest Xray was normal.  Type2 diabetes mellitus  On Tradjenta BS were running Less then 150 Her A1C was 6.4 in 07/19 Repeat A1C On  Lyrica for Neuropathy COPD Continue on Nebs.PRN On Trelegy and Oxygen IAtrial fibrillation Rate controlled on diltiazem On Eliquis Will continue Low dose as patient has h/o Falls and very frail Left shoulder Pain Xray in hospital showed Arthritis Continues Biofreeze and Norco for Pain   Rheumatoid arthritis Patient restarted on Methotrexate and is now had no Acute flares. Will Continue her on that dose and Follow her Hepatic Panel H/O Aspiration Pneumonia She is on Dysphagia Diet. Hyperlipidemia On Crestor Dementia Patient on Aricept. Stays Full dependent for her ADL Depression On Cymbalta GERD On Protonix    Family/ staff Communication:   Labs/tests ordered:    Total time spent in this patient care encounter was 25_ minutes; greater than 50% of the visit spent counseling patient, reviewing records , Labs and coordinating care for problems addressed at this encounter.

## 2017-12-29 ENCOUNTER — Encounter: Payer: Self-pay | Admitting: Internal Medicine

## 2018-01-08 DIAGNOSIS — E113293 Type 2 diabetes mellitus with mild nonproliferative diabetic retinopathy without macular edema, bilateral: Secondary | ICD-10-CM | POA: Diagnosis not present

## 2018-01-08 DIAGNOSIS — Z794 Long term (current) use of insulin: Secondary | ICD-10-CM | POA: Diagnosis not present

## 2018-01-08 DIAGNOSIS — Z7984 Long term (current) use of oral hypoglycemic drugs: Secondary | ICD-10-CM | POA: Diagnosis not present

## 2018-01-08 DIAGNOSIS — Z7901 Long term (current) use of anticoagulants: Secondary | ICD-10-CM | POA: Diagnosis not present

## 2018-01-16 ENCOUNTER — Encounter: Payer: Self-pay | Admitting: Internal Medicine

## 2018-01-16 ENCOUNTER — Non-Acute Institutional Stay (SKILLED_NURSING_FACILITY): Payer: Medicare Other | Admitting: Internal Medicine

## 2018-01-16 ENCOUNTER — Other Ambulatory Visit: Payer: Self-pay

## 2018-01-16 DIAGNOSIS — M0520 Rheumatoid vasculitis with rheumatoid arthritis of unspecified site: Secondary | ICD-10-CM | POA: Diagnosis not present

## 2018-01-16 DIAGNOSIS — I4891 Unspecified atrial fibrillation: Secondary | ICD-10-CM | POA: Diagnosis not present

## 2018-01-16 DIAGNOSIS — K219 Gastro-esophageal reflux disease without esophagitis: Secondary | ICD-10-CM

## 2018-01-16 DIAGNOSIS — R5383 Other fatigue: Secondary | ICD-10-CM

## 2018-01-16 DIAGNOSIS — I5032 Chronic diastolic (congestive) heart failure: Secondary | ICD-10-CM | POA: Diagnosis not present

## 2018-01-16 DIAGNOSIS — E1140 Type 2 diabetes mellitus with diabetic neuropathy, unspecified: Secondary | ICD-10-CM | POA: Diagnosis not present

## 2018-01-16 DIAGNOSIS — G629 Polyneuropathy, unspecified: Secondary | ICD-10-CM | POA: Diagnosis not present

## 2018-01-16 MED ORDER — HYDROCODONE-ACETAMINOPHEN 5-325 MG PO TABS: 1.0000 | ORAL_TABLET | Freq: Every day | ORAL | 0 refills | Status: DC

## 2018-01-16 NOTE — Progress Notes (Signed)
Location:    [facility] Room Number: 152/W Place of Service:  SNF [phone]) Provider:  Cory Roughen, MD  Patient Care Team: Celene Squibb, MD as PCP - General  Extended Emergency Contact Information Primary Emergency Contact: Nance,Sheila Address: [address]          [place], [place] [address] [place] Phone: [phone] Relation: Daughter Secondary Emergency Contact: Charlynne Pander, [place] [address] [place] Phone: [phone] Relation: Granddaughter  Code Status:  DNR Goals of care: Advanced Directive information Advanced Directives 01/16/2018  Does Patient Have a Medical Advance Directive? Yes  Type of Advance Directive Out of facility DNR (pink MOST or yellow form)  Does patient want to make changes to medical advance directive? No - Patient declined  Copy of [place] in Chart? No - copy requested  Would patient like information on creating a medical advance directive? No - Patient declined  Pre-existing out of facility DNR order (yellow form or pink MOST form) -     Chief Complaint  Patient presents with  . Acute Visit    Lethargy    HPI:  Pt is a 84 y.o. female seen today for an acute visit for apparently some a.m. somnolence.  Patient has h/o COPD, Chronic Atrial Fibrillation on Eliquis, Rheumatoid arthritis, Fibromyalgia, Peripheral Neuropathy, Hyperlipidemia, PUD, and anxiety disorder.  She has been quite stable for an extended period of time she was restarted on methotrexate because of arthritic flares and appears to be tolerating this and this appears to be helping.  She is also on Lyrica 50 mg twice daily in addition of Vicodin in the morning for pain issues.  Apparently she has some a.m. lethargy however-.  She does receive her Vicodin as well as Lyrica at the same time at 9:00 in the morning and she was noted today to be somewhat lethargic   When I  saw her later in the morning she was bright and alert and at her baseline.  Vital signs are stable-she denies having lethargy or feeling more sleepy-  Says at times she does not sleep that well at night because she is up in the morning.  However nursing is concerned that she gets the Vicodin and Lyrica together and this might be a bit much.     Past Medical History:  Diagnosis Date  . Anemia   . Atrial fibrillation ([facility])   . Back pain   . Bacterial pneumonia   . Cancer (HCC)    Skin   . Chronic respiratory failure ([place])   . Cognitive communication deficit   . COPD (chronic obstructive pulmonary disease) ([facility])   . Dementia ([place])   . Dementia ([place])   . Depression   . Diabetes mellitus without complication ([place])   . Diverticulosis   . Diverticulosis   . Fatty liver   . Fibromyalgia   . Fibromyalgia   . Gastric polyp 09/08/11   at the anastomosis-inflammatory  . Generalized anxiety disorder   . Generalized anxiety disorder   . GERD (gastroesophageal reflux disease)   . GERD (gastroesophageal reflux disease)   . 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 Hypercholesteremia   . Hypertension   . Insomnia   . Internal hemorrhoids   . Muscle weakness   . Neuropathy   . On home O2    qhs  . Osteoporosis   .  Rheumatoid arteritis ([facility])   . Ulcer    stomach  . Vitamin D deficiency    Past Surgical History:  Procedure Laterality Date  . ABDOMINAL HYSTERECTOMY    . ABDOMINAL SURGERY     removed patial stomach from ulcer  . COLONOSCOPY  04-2001   internal hemorrhoids, panocolonic diverticulosis, and colon polyps   . UPPER GASTROINTESTINAL ENDOSCOPY      Allergies  Allergen Reactions  . Other     Band Aids   . Tape   . Lipitor [Atorvastatin Calcium] Rash and Other (See Comments)    Muscle pain  . Niaspan [Niacin Er] Rash and Other (See Comments)    Muscle pain    Outpatient Encounter Medications as of 01/16/2018  Medication Sig  . acetaminophen (TYLENOL) 325 MG  tablet Take 650 mg by mouth every 8 (eight) hours as needed.  Marland Kitchen albuterol (PROAIR HFA) 108 (90 BASE) MCG/ACT inhaler Inhale 2 puffs into the lungs every 6 (six) hours as needed for wheezing or shortness of breath. For rescue   . apixaban (ELIQUIS) 2.5 MG TABS tablet Take 2.5 mg by mouth 2 (two) times daily.  Roseanne Kaufman Peru-Castor Oil (VENELEX) OINT Apply to sacrum and bilateral buttocks q shift and prn for prevention  . CARTIA XT 300 MG 24 hr capsule TAKE ONE CAPSULE BY MOUTH EVERY DAY  . donepezil (ARICEPT) 5 MG tablet Take 5 mg by mouth at bedtime.  . DULoxetine (CYMBALTA) 60 MG capsule Take 60 mg by mouth daily.    . Fluticasone-Umeclidin-Vilant (TRELEGY ELLIPTA) 100-62.5-25 MCG/INH AEPB One puff inhalation once a day  . furosemide (LASIX) 40 MG tablet Take 40 mg by mouth daily.  Marland Kitchen guaifenesin (ROBITUSSIN) 100 MG/5ML syrup Take 200 mg by mouth 4 (four) times daily as needed for cough.  Marland Kitchen HYDROcodone-acetaminophen (NORCO/VICODIN) 5-325 MG tablet Take 1 tablet by mouth daily.  Marland Kitchen ipratropium-albuterol (DUONEB) 0.5-2.5 (3) MG/3ML SOLN Take 3 mLs by nebulization 2 (two) times daily as needed.  . linagliptin (TRADJENTA) 5 MG TABS tablet Take 1 tablet (5 mg total) by mouth daily.  . Menthol, Topical Analgesic, (BIOFREEZE) 4 % GEL Apply to both knees daily as needed and to left shoulder  . methotrexate (RHEUMATREX) 2.5 MG tablet Take 12.5 mg by mouth once a week. Caution:Chemotherapy. Protect from light.  . Multiple Vitamin (DAILY VITE PO) Take 1 tablet by mouth daily.  . pantoprazole (PROTONIX) 40 MG tablet Take 1 tablet (40 mg total) by mouth daily.  . phenylephrine-shark liver oil-mineral oil-petrolatum (PREPARATION H) 0.25-3-14-71.9 % rectal ointment Place 1 application rectally 2 (two) times daily as needed for hemorrhoids.  . polyethylene glycol (MIRALAX / GLYCOLAX) packet Take 17 g by mouth daily as needed for moderate constipation.  . potassium chloride SA (K-DUR,KLOR-CON) 20 MEQ tablet Take 2  tablets (40 mg ) by mouth twice a day  . pregabalin (LYRICA) 50 MG capsule Take 1 capsule (50 mg total) by mouth 2 (two) times daily.  Marland Kitchen Propylene Glycol (SYSTANE BALANCE) 0.6 % SOLN Place 1 drop into both eyes 2 (two) times daily.  . raloxifene (EVISTA) 60 MG tablet Take 60 mg by mouth daily.  . rosuvastatin (CRESTOR) 20 MG tablet Take 20 mg by mouth daily.  . simethicone (MYLICON) 80 MG chewable tablet Chew 80 mg by mouth 2 (two) times daily as needed for flatulence.   No facility-administered encounter medications on file as of 01/16/2018.     Review of Systems   General she not complaining of fever  chills her weight appears to be relatively stable.  Skin does not complain of rashes itching or diaphoresis.  Head ears eyes nose mouth and throat is not complain of visual changes or sore throat.  Respiratory denies shortness of breath or cough at this time she does have some history of cough and is on chronic oxygen with history of COPD  Cardiac does not complain of chest pain appears to have her baseline lower extremity edema venous stasis changes.  GI does not complain of nausea vomiting diarrhea constipation says occasionally she will feel little belching-burning sensation-she is on Protonix.  GU is not complaining of dysuria.  Musculoskeletal does at times complain of joint pain more so her shoulders this morning but apparently the methotrexate has helped with any flares  Neurologic is not complaining of dizziness headache numbness- I do note she is on Lyrica for suspected diabetic neuropathy.  Psych she denies being depressed or anxious she actually is getting out of her room a bit more which is a positive sign--she continues on Cymbalta  Immunization History  Administered Date(s) Administered  . Tdap 10/21/2016   Pertinent  Health Maintenance Due  Topic Date Due  . INFLUENZA VACCINE  01/16/2018 (Originally 10/12/2017)  . URINE MICROALBUMIN  01/28/2018 (Originally 06/05/1938)    . DEXA SCAN  01/28/2018 (Originally 06/06/1995)  . PNA vac Low Risk Adult (1 of 2 - PCV13) 12/29/2018 (Originally 06/06/1995)  . HEMOGLOBIN A1C  04/01/2018  . FOOT EXAM  06/22/2018  . OPHTHALMOLOGY EXAM  07/05/2018   Fall Risk  08/15/2017  Falls in the past year? No   Functional Status Survey:    Vitals:   01/16/18 1417  BP: 119/82  Pulse: 75  Resp: (!) 22  Temp: 97.9 F (36.6 C)  TempSrc: Oral  SpO2: 97%  Weight appears relatively stable at 194 pounds  Physical Exam   In general this is a pleasant elderly female in no distress she is bright and alert.  Her skin is warm and dry.  Eyes visual acuity appears to be intact pupils are equal round reactive to light visual acuity appears to be intact.  Oropharynx is clear mucous membranes moist tongue is midline.  Chest she has shallow air entry but could not appreciate any labored breathing or significant congestion.  Heart is irregular irregular rate and rhythm with a systolic murmur she has mild lower extremity edema appears relatively baseline.  Abdomen is somewhat obese soft nontender with positive bowel sounds.  Musculoskeletal moves all her extremities at baseline- the swelling on her right upper extremity appears to have improved- she does have limited range of motion of her left shoulder but this is not new either.  Neurologic is grossly intact her speech could not really appreciate lateralizing findings.  Psych she is pleasant and appropriate    Labs reviewed: Recent Labs    03/31/17 1852  05/03/17 0914  06/12/17 0727  07/11/17 0714  11/27/17 0600 12/07/17 0700 12/14/17 0700  NA 142   < > 136   < > 140   < >  --    < > 141 141 141  K 3.5   < > 2.8*   < > 3.3*   < >  --    < > 4.0 3.4* 4.1  CL 102   < > 93*   < > 102   < >  --    < > 107 105 107  CO2 29   < > 29   < >  30   < >  --    < > 28 28 28   GLUCOSE 165*   < > 296*   < > 171*   < >  --    < > 123* 147* 132*  BUN 17   < > 22*   < > 14   < >  --    <  > 19 14 15   CREATININE 1.29*   < > 1.60*   < > 1.19*   < >  --    < > 1.01* 1.10* 1.16*  CALCIUM 9.7   < > 9.3   < > 9.5   < >  --    < > 9.6 9.6 9.6  MG 2.0   < > 1.7  --  1.9  --  1.8  --   --   --   --   PHOS 3.0  --   --   --   --   --   --   --   --   --   --    < > = values in this interval not displayed.   Recent Labs    09/29/17 0715 11/08/17 0706 11/23/17 0731  AST 67* 18 43*  ALT 61* 14 43  ALKPHOS 77 82 87  BILITOT 0.6 0.5 0.6  PROT 6.5 5.9* 6.3*  ALBUMIN 3.5 3.0* 3.2*   Recent Labs    11/08/17 0706 11/18/17 0931 11/19/17 0900  WBC 6.6 6.9 8.3  NEUTROABS 3.2 3.1 4.0  HGB 13.1 14.8 14.2  HCT 42.8 46.5* 45.1  MCV 87.9 85.6 86.4  PLT 95* 118* 136*   Lab Results  Component Value Date   TSH 2.992 07/26/2016   Lab Results  Component Value Date   HGBA1C 6.4 (H) 09/29/2017   No results found for: CHOL, HDL, LDLCALC, LDLDIRECT, TRIG, CHOLHDL  Significant Diagnostic Results in last 30 days:  No results found.  Assessment/Plan  #1-lethargy- this appears to be most likely medication related she is currently bright and alert but she did receive Lyrica 50 mg as well as her Vicodin 5-325 at the same time this morning- will discontinue the a.m. Lyrica continue the p.m. dose and monitor her neuropathic discomfort. .  2.  History of GERD she is on Protonix-apparently occasionally she will complain of this burning belching but is not persistent from what I can tell talking to nursing will monitor for now.  3.  History of rheumatoid arthritis her methotrexate has been restarted this appears to be helping she is also receiving the Vicodin in the morning we have decreased her Lyrica to once a day-  Again this is complicated with her history of diabetic neuropathy as well as left shoulder pain which is chronic- will monitor to see if she needs Lyrica in the morning but at this point would be concerned about increased lethargy which was witnessed apparently this morning.--She  does have Biofreeze topical as well.  4.  History of atrial fibrillation this appears rate controlled on diltiazem she is on low-dose Eliquis for anticoagulation because of her history of falls and frailty.  5.  History of diastolic CHF her edema and weight appears to be relatively stable she is on Lasix 40 mg a day as well as potassium 20 mEq twice daily-I do note at one point her potassium was slightly low will update a metabolic panel to ensure stability potassium most recently has normalized at 4.1  6.  History of type 2  diabetes this appears well controlled with blood sugars largely in the lower mid 100s- she continues on Tradjenta 5 mg a day   [id number]

## 2018-01-16 NOTE — Telephone Encounter (Signed)
RX Fax for [facility]@ [phone]  

## 2018-01-17 ENCOUNTER — Encounter (HOSPITAL_COMMUNITY)
Admission: RE | Admit: 2018-01-17 | Discharge: 2018-01-17 | Disposition: A | Discharge status: Home / Self Care | Payer: Medicare Other | Source: Skilled Nursing Facility | Attending: Internal Medicine | Admitting: Internal Medicine

## 2018-01-17 DIAGNOSIS — I4819 Other persistent atrial fibrillation: Secondary | ICD-10-CM | POA: Insufficient documentation

## 2018-01-17 DIAGNOSIS — J449 Chronic obstructive pulmonary disease, unspecified: Secondary | ICD-10-CM | POA: Diagnosis not present

## 2018-01-17 DIAGNOSIS — I129 Hypertensive chronic kidney disease with stage 1 through stage 4 chronic kidney disease, or unspecified chronic kidney disease: Secondary | ICD-10-CM | POA: Insufficient documentation

## 2018-01-17 DIAGNOSIS — E876 Hypokalemia: Secondary | ICD-10-CM | POA: Diagnosis not present

## 2018-01-17 LAB — BASIC METABOLIC PANEL
Anion gap: 8 (ref 5–15)
BUN: 15 mg/dL (ref 8–23)
CHLORIDE: 102 mmol/L (ref 98–111)
CO2: 30 mmol/L (ref 22–32)
CREATININE: 1.18 mg/dL — AB (ref 0.44–1.00)
Calcium: 9.9 mg/dL (ref 8.9–10.3)
GFR, EST AFRICAN AMERICAN: 47 mL/min — AB (ref >60)
GFR, EST NON AFRICAN AMERICAN: 40 mL/min — AB (ref >60)
Glucose, Bld: 129 mg/dL — ABNORMAL HIGH (ref 70–99)
POTASSIUM: 4.1 mmol/L (ref 3.5–5.1)
SODIUM: 140 mmol/L (ref 135–145)

## 2018-01-19 ENCOUNTER — Other Ambulatory Visit: Payer: Self-pay

## 2018-01-19 MED ORDER — HYDROCODONE-ACETAMINOPHEN 5-325 MG PO TABS: 1.0000 | ORAL_TABLET | Freq: Every day | ORAL | 0 refills | Status: DC

## 2018-01-19 NOTE — Telephone Encounter (Signed)
RX Fax for [facility]@ [phone]  

## 2018-01-22 ENCOUNTER — Other Ambulatory Visit: Payer: Self-pay

## 2018-01-22 MED ORDER — HYDROCODONE-ACETAMINOPHEN 5-325 MG PO TABS: 1.0000 | ORAL_TABLET | Freq: Every day | ORAL | 0 refills | Status: DC

## 2018-01-22 NOTE — Telephone Encounter (Signed)
RX Fax for [facility]@ [phone]  

## 2018-02-14 ENCOUNTER — Other Ambulatory Visit: Payer: Self-pay

## 2018-02-14 MED ORDER — HYDROCODONE-ACETAMINOPHEN 5-325 MG PO TABS: 1.0000 | ORAL_TABLET | Freq: Every day | ORAL | 0 refills | Status: DC

## 2018-02-14 NOTE — Telephone Encounter (Signed)
RX Fax for [facility]@ [phone]  

## 2018-02-20 ENCOUNTER — Other Ambulatory Visit: Payer: Self-pay

## 2018-02-20 MED ORDER — PREGABALIN 50 MG PO CAPS: ORAL_CAPSULE | ORAL | 0 refills | Status: DC

## 2018-02-20 NOTE — Telephone Encounter (Signed)
RX Fax for [facility]@ [phone]  

## 2018-02-28 ENCOUNTER — Non-Acute Institutional Stay (SKILLED_NURSING_FACILITY): Payer: Medicare Other | Admitting: Internal Medicine

## 2018-02-28 ENCOUNTER — Encounter: Payer: Self-pay | Admitting: Internal Medicine

## 2018-02-28 DIAGNOSIS — I4891 Unspecified atrial fibrillation: Secondary | ICD-10-CM

## 2018-02-28 DIAGNOSIS — M0520 Rheumatoid vasculitis with rheumatoid arthritis of unspecified site: Secondary | ICD-10-CM

## 2018-02-28 DIAGNOSIS — J449 Chronic obstructive pulmonary disease, unspecified: Secondary | ICD-10-CM | POA: Diagnosis not present

## 2018-02-28 DIAGNOSIS — M19012 Primary osteoarthritis, left shoulder: Secondary | ICD-10-CM

## 2018-02-28 DIAGNOSIS — M19011 Primary osteoarthritis, right shoulder: Secondary | ICD-10-CM

## 2018-02-28 DIAGNOSIS — I5032 Chronic diastolic (congestive) heart failure: Secondary | ICD-10-CM

## 2018-02-28 DIAGNOSIS — E1140 Type 2 diabetes mellitus with diabetic neuropathy, unspecified: Secondary | ICD-10-CM | POA: Diagnosis not present

## 2018-02-28 NOTE — Progress Notes (Signed)
Location:    [facility] Room Number: 152/W Place of Service:  SNF [phone]) Provider: Granville Lewis PA-C  Celene Squibb, MD  Patient Care Team: Celene Squibb, MD as PCP - General  Extended Emergency Contact Information Primary Emergency Contact: Nance,Sheila Address: [address]          [place], [place] [address] [place] Phone: [phone] Relation: Daughter Secondary Emergency Contact: Charlynne Pander, [place] [address] [place] Phone: [phone] Relation: Granddaughter  Code Status:  DNR Goals of care: Advanced Directive information Advanced Directives 02/28/2018  Does Patient Have a Medical Advance Directive? Yes  Type of Advance Directive Out of facility DNR (pink MOST or yellow form)  Does patient want to make changes to medical advance directive? No - Patient declined  Copy of [place] in Chart? No - copy requested  Would patient like information on creating a medical advance directive? No - Patient declined  Pre-existing out of facility DNR order (yellow form or pink MOST form) -     Chief Complaint  Patient presents with  . Medical Management of Chronic Issues    Routine visit of medical management  Medical management of chronic medical issues including COPD-rheumatoid arthritis-atrial fibrillation- type 2 diabetes- chronic pain with fibromyalgia and osteoarthritis.  Peripheral neuropathy- peptic ulcer disease- dementia with anxiety- and diastolic CHF  HPI:  Pt is a 84 y.o. female seen today for medical management of chronic diseases.  As above-despite her multiple diagnoses she appears to be relatively stable she still complains of osteoarthritic pain especially of her right shoulder- she does have orders for Vicodin as well as Biofreeze topical- her pain complaint is more her right shoulder this evening I see there is an order for Biofreeze to the left shoulder will also make  this available to the right shoulder.  She does have a history of COPD she is on chronic oxygen but this appears stable she is on nebulizers as needed as well as pro-air and Ellipta.  She is not really complaining of any increased shortness of breath or cough beyond baseline today.  Regards atrial fibrillation she is on low-dose Eliquis because of fall risk she is also on Cardizem for rate control this appears to be stable.  Rheumatoid arthritis was significant with flares previously but she appears to be responding to the methotrexate-.  She also has a history of neuropathy she is on Lyrica we decreased this to just nightly secondary to lethargy concerns earlier in the day.  In regards to peptic ulcer disease she is on protonic she still complains of some discomfort in that area at times-she says this is somewhat chronic we will try a trial course of Protonix twice daily to see if this is more helpful.  Regards to dementia she is on Aricept she continues to be pleasant appropriate still at times likes to stay in her room although staff has been trying to get her out more in the hallway and mingling with other residents.  Regards to diastolic CHF her edema and weight appears to be stable she is on Lasix with potassium supplementation.  She is also a type II diabetic but this appears well controlled on Tradjenta blood sugars continue to be in the low to mid 100s pretty consistently her hemoglobin A1c was 6.4 back in July.  Currently she is resting in bed comfortably her only complaint  really is some right shoulder discomfort she describes as relatively chronic.     Past Medical History:  Diagnosis Date  . Anemia   . Atrial fibrillation ([facility])   . Back pain   . Bacterial pneumonia   . Cancer (HCC)    Skin   . Chronic respiratory failure ([facility])   . Cognitive communication deficit   . COPD (chronic obstructive pulmonary disease) ([facility])   . Dementia ([facility])   . Dementia ([facility])   .  Depression   . Diabetes mellitus without complication ([facility])   . Diverticulosis   . Diverticulosis   . Fatty liver   . Fibromyalgia   . Fibromyalgia   . Gastric polyp 09/08/11   at the anastomosis-inflammatory  . Generalized anxiety disorder   . Generalized anxiety disorder   . GERD (gastroesophageal reflux disease)   . GERD (gastroesophageal reflux disease)   . 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 Hypercholesteremia   . Hypertension   . Insomnia   . Internal hemorrhoids   . Muscle weakness   . Neuropathy   . On home O2    qhs  . Osteoporosis   . Rheumatoid arteritis ([facility])   . Ulcer    stomach  . Vitamin D deficiency    Past Surgical History:  Procedure Laterality Date  . ABDOMINAL HYSTERECTOMY    . ABDOMINAL SURGERY     removed patial stomach from ulcer  . COLONOSCOPY  04-2001   internal hemorrhoids, panocolonic diverticulosis, and colon polyps   . UPPER GASTROINTESTINAL ENDOSCOPY      Allergies  Allergen Reactions  . Other     Band Aids   . Tape   . Lipitor [Atorvastatin Calcium] Rash and Other (See Comments)    Muscle pain  . Niaspan [Niacin Er] Rash and Other (See Comments)    Muscle pain    Outpatient Encounter Medications as of 02/28/2018  Medication Sig  . acetaminophen (TYLENOL) 325 MG tablet Take 650 mg by mouth every 8 (eight) hours as needed.  Marland Kitchen albuterol (PROAIR HFA) 108 (90 BASE) MCG/ACT inhaler Inhale 2 puffs into the lungs every 6 (six) hours as needed for wheezing or shortness of breath. For rescue   . apixaban (ELIQUIS) 2.5 MG TABS tablet Take 2.5 mg by mouth 2 (two) times daily.  Roseanne Kaufman Peru-Castor Oil (VENELEX) OINT Apply to sacrum and bilateral buttocks q shift and prn for prevention  . CARTIA XT 300 MG 24 hr capsule TAKE ONE CAPSULE BY MOUTH EVERY DAY  . donepezil (ARICEPT) 5 MG tablet Take 5 mg by mouth at bedtime.  . DULoxetine (CYMBALTA) 60 MG capsule Take 60 mg by mouth daily.    . Fluticasone-Umeclidin-Vilant (TRELEGY ELLIPTA)  100-62.5-25 MCG/INH AEPB One puff inhalation once a day  . Folic Acid 5 MG CAPS Take 5 mg by mouth every Sunday.  . furosemide (LASIX) 40 MG tablet Take 40 mg by mouth daily.  Marland Kitchen guaifenesin (ROBITUSSIN) 100 MG/5ML syrup Take 200 mg by mouth 4 (four) times daily as needed for cough.  Marland Kitchen HYDROcodone-acetaminophen (NORCO/VICODIN) 5-325 MG tablet Take 1 tablet by mouth daily.  Marland Kitchen ipratropium-albuterol (DUONEB) 0.5-2.5 (3) MG/3ML SOLN Take 3 mLs by nebulization 2 (two) times daily as needed.  . linagliptin (TRADJENTA) 5 MG TABS tablet Take 1 tablet (5 mg total) by mouth daily.  . Menthol, Topical Analgesic, (BIOFREEZE) 4 % GEL Apply to both knees daily as needed and to left shoulder  . methotrexate (RHEUMATREX) 2.5 MG tablet Take 12.5 mg  by mouth once a week. Caution:Chemotherapy. Protect from light.  . Multiple Vitamin (DAILY VITE PO) Take 1 tablet by mouth daily.  . pantoprazole (PROTONIX) 40 MG tablet Take 1 tablet (40 mg total) by mouth daily.  . phenylephrine-shark liver oil-mineral oil-petrolatum (PREPARATION H) 0.25-3-14-71.9 % rectal ointment Place 1 application rectally 2 (two) times daily as needed for hemorrhoids.  . polyethylene glycol (MIRALAX / GLYCOLAX) packet Take 17 g by mouth daily as needed for moderate constipation.  . potassium chloride SA (K-DUR,KLOR-CON) 20 MEQ tablet Take 2 tablets (40 mg ) by mouth twice a day  . pregabalin (LYRICA) 50 MG capsule Take 1 capsule by mouth once daily  . Propylene Glycol (SYSTANE BALANCE) 0.6 % SOLN Place 1 drop into both eyes 2 (two) times daily.  . raloxifene (EVISTA) 60 MG tablet Take 60 mg by mouth daily.  . rosuvastatin (CRESTOR) 20 MG tablet Take 20 mg by mouth daily.  . simethicone (MYLICON) 80 MG chewable tablet Chew 80 mg by mouth 2 (two) times daily as needed for flatulence.   No facility-administered encounter medications on file as of 02/28/2018.      Review of Systems   In general she is not complaining of any fever chills her  weight appears to be relatively stable.  Skin is not complaining of rashes or itching or being diaphoretic.  Head ears eyes nose mouth and throat does not complain of visual changes or sore throat.  Respiratory denies any shortness of breath or cough beyond baseline does have a history of COPD on chronic oxygen.  Cardiac does not complain of chest pain has baseline fairly mild lower extremity edema.  GI is not complaining of nausea or vomiting diarrhea constipation continues to complain of some what sounds like GERD-like symptoms at times with a history of peptic ulcer disease.  GU is not complaining of dysuria.  Musculoskeletal does have somewhat diffuse osteoarthritic complaints more so of her shouldersThis is more her right shoulder this evening   Neurologic does not complain of dizziness headache numbness at this time or syncope.  She is on Lyrica for neuropathy.  And psych does not complain of being overtly depressed or anxious- she continues on Cymbalta.    Immunization History  Administered Date(s) Administered  . Tdap 10/21/2016   Pertinent  Health Maintenance Due  Topic Date Due  . INFLUENZA VACCINE  02/28/2018 (Originally 10/12/2017)  . URINE MICROALBUMIN  03/31/2018 (Originally 06/05/1938)  . DEXA SCAN  03/31/2018 (Originally 06/06/1995)  . PNA vac Low Risk Adult (1 of 2 - PCV13) 12/29/2018 (Originally 06/06/1995)  . HEMOGLOBIN A1C  04/01/2018  . FOOT EXAM  06/22/2018  . OPHTHALMOLOGY EXAM  07/05/2018   Fall Risk  08/15/2017  Falls in the past year? No   Functional Status Survey:    Vitals:   02/28/18 1518  BP: 130/86  Pulse: 81  Resp: 18  Temp: (!) 96.4 F (35.8 C)  TempSrc: Oral  SpO2: 95%  Weight: 193 lb 9.6 oz (87.8 kg)  Height: 5\' 3"  (1.6 m)  Manual blood pressure this evening was 116/62 Body mass index is 34.29 kg/m. Physical Exam   In general this is a pleasant elderly female lying in bed comfortably.  Her skin is warm and dry.  Eyes visual  acuity appears to be intact sclera and conjunctive are clear.  Oropharynx is clear mucous membranes moist.  Chest has shallow air entry and a small amount of rhonchi on the right I do not believe this  is significantly changed from baseline there is no labored breathing.  Heart irregular irregular rate and rhythm with a slight systolic murmur gallop or rub she has quite mild lower extremity edema.  Abdomen is obese soft minimal tenderness with palpation with positive bowel sounds.  Musculoskeletal Limited exam since she is in bed but is able to move her upper extremities appears at baseline with limitations of her shoulders because of the arthritis-I do not note any deformities erythema or edema of the shoulders.  Neurologic is grossly intact her speech is clear cannot appreciate lateralizing findings.   Psych she is pleasant and appropriate appears to be at baseline .    Labs reviewed: Recent Labs    03/31/17 1852  05/03/17 0914  06/12/17 0727  07/11/17 0714  12/07/17 0700 12/14/17 0700 01/17/18 0747  NA 142   < > 136   < > 140   < >  --    < > 141 141 140  K 3.5   < > 2.8*   < > 3.3*   < >  --    < > 3.4* 4.1 4.1  CL 102   < > 93*   < > 102   < >  --    < > 105 107 102  CO2 29   < > 29   < > 30   < >  --    < > 28 28 30   GLUCOSE 165*   < > 296*   < > 171*   < >  --    < > 147* 132* 129*  BUN 17   < > 22*   < > 14   < >  --    < > 14 15 15   CREATININE 1.29*   < > 1.60*   < > 1.19*   < >  --    < > 1.10* 1.16* 1.18*  CALCIUM 9.7   < > 9.3   < > 9.5   < >  --    < > 9.6 9.6 9.9  MG 2.0   < > 1.7  --  1.9  --  1.8  --   --   --   --   PHOS 3.0  --   --   --   --   --   --   --   --   --   --    < > = values in this interval not displayed.   Recent Labs    09/29/17 0715 11/08/17 0706 11/23/17 0731  AST 67* 18 43*  ALT 61* 14 43  ALKPHOS 77 82 87  BILITOT 0.6 0.5 0.6  PROT 6.5 5.9* 6.3*  ALBUMIN 3.5 3.0* 3.2*   Recent Labs    11/08/17 0706 11/18/17 0931  11/19/17 0900  WBC 6.6 6.9 8.3  NEUTROABS 3.2 3.1 4.0  HGB 13.1 14.8 14.2  HCT 42.8 46.5* 45.1  MCV 87.9 85.6 86.4  PLT 95* 118* 136*   Lab Results  Component Value Date   TSH 2.992 07/26/2016   Lab Results  Component Value Date   HGBA1C 6.4 (H) 09/29/2017   No results found for: CHOL, HDL, LDLCALC, LDLDIRECT, TRIG, CHOLHDL  Significant Diagnostic Results in last 30 days:  No results found.  Assessment/Plan  #1-history of COPD she is on chronic oxygen she appears to be doing all right with the PRN duo nebs continues on pro-air and Ellipta as well at this point monitor-she  has been stable now for some time in this regards.  2.  History of atrial fibrillation this appears rate controlled on the Cardizem she is on low-dose Eliquis again not on a higher dose because of fall risk.  3.  History of rheumatoid arthritis she has been restarted on methotrexate and this appears to be helping with her flares- will update liver function test to ensure stability here.  4.  History of fibromyalgia and peripheral neuropathy she continues on Lyrica nightly this was reduced secondary to lethargy concerned she no longer receives it in the morning.  She is also on Cymbalta for coexistent depression with pain.  5.  History of peptic ulcer disease she is on Protonix once a day she is complaining of some what appears to be more GERD-like discomfort at times with some occasional discomfort bloating feeling in the sternal area- will increase Protonix to twice daily to see if this helps.  6.-  History of dementia she continues on Aricept this appears to be mild she does well with supportive care. Staff is trying to get her out of her room more.  7.-  History of osteoarthritis- continues on Vicodin she also has an order for Biofreeze to the left shoulder will add this to the right shoulder hopefully this will give her some more relief- she is also on methotrexate which I suspect will help with any  rheumatoid arthritis etiology.  8.  History of osteoporosis she continues on Evista.  9.  History of diastolic CHF her weight and edema appears to be relatively stable she is on Lasix with potassium supplementation she does have some history of mild hypokalemia in the past will update a metabolic panel.  10.  History of type 2 diabetes she continues on Tradjenta this appears well controlled CBGs largely in the lower to mid 100s-hemoglobin A1c was 6.4 in July.  11.  History of thrombocytopenia this appears somewhat chronic per chart review last platelet count was 136,000 will update this to ensure stability.  12.  History of hyperlipidemia she continues on Crestor.  Again will update a metabolic panel to keep an eye on her liver function tests renal function electrolytes also a CBC to check her platelet level..  Also will increase Protonix to twice daily to see if this helps with her GERD-like symptoms  [id number]-of note greater than 40 minutes spent assessing patient-discussing her concerns at bedside- coordinating and formulating a plan of care for numerous diagnoses- of note greater than 50% of time spent coordinating a plan of care with input as noted above   7.

## 2018-03-01 ENCOUNTER — Encounter (HOSPITAL_COMMUNITY)
Admission: RE | Admit: 2018-03-01 | Discharge: 2018-03-01 | Disposition: A | Discharge status: Home / Self Care | Payer: Medicare Other | Source: Skilled Nursing Facility | Attending: Internal Medicine | Admitting: Internal Medicine

## 2018-03-01 DIAGNOSIS — I10 Essential (primary) hypertension: Secondary | ICD-10-CM | POA: Insufficient documentation

## 2018-03-01 DIAGNOSIS — J449 Chronic obstructive pulmonary disease, unspecified: Secondary | ICD-10-CM | POA: Diagnosis not present

## 2018-03-01 DIAGNOSIS — I4819 Other persistent atrial fibrillation: Secondary | ICD-10-CM | POA: Insufficient documentation

## 2018-03-01 LAB — CBC WITH DIFFERENTIAL/PLATELET
ABS IMMATURE GRANULOCYTES: 0.01 10*3/uL (ref 0.00–0.07)
Basophils Absolute: 0.1 10*3/uL (ref 0.0–0.1)
Basophils Relative: 1 %
Eosinophils Absolute: 0.3 10*3/uL (ref 0.0–0.5)
Eosinophils Relative: 4 %
HCT: 48.3 % — ABNORMAL HIGH (ref 36.0–46.0)
Hemoglobin: 14.3 g/dL (ref 12.0–15.0)
Immature Granulocytes: 0 %
Lymphocytes Relative: 35 %
Lymphs Abs: 2.2 10*3/uL (ref 0.7–4.0)
MCH: 26.3 pg (ref 26.0–34.0)
MCHC: 29.6 g/dL — ABNORMAL LOW (ref 30.0–36.0)
MCV: 89 fL (ref 80.0–100.0)
MONO ABS: 0.3 10*3/uL (ref 0.1–1.0)
Monocytes Relative: 5 %
NEUTROS ABS: 3.5 10*3/uL (ref 1.7–7.7)
Neutrophils Relative %: 55 %
Platelets: 185 10*3/uL (ref 150–400)
RBC: 5.43 MIL/uL — AB (ref 3.87–5.11)
RDW: 17.7 % — ABNORMAL HIGH (ref 11.5–15.5)
WBC: 6.3 10*3/uL (ref 4.0–10.5)
nRBC: 0 % (ref 0.0–0.2)

## 2018-03-01 LAB — COMPREHENSIVE METABOLIC PANEL
ALT: 25 U/L (ref 0–44)
ANION GAP: 6 (ref 5–15)
AST: 24 U/L (ref 15–41)
Albumin: 3.3 g/dL — ABNORMAL LOW (ref 3.5–5.0)
Alkaline Phosphatase: 69 U/L (ref 38–126)
BUN: 17 mg/dL (ref 8–23)
CHLORIDE: 105 mmol/L (ref 98–111)
CO2: 28 mmol/L (ref 22–32)
Calcium: 9.7 mg/dL (ref 8.9–10.3)
Creatinine, Ser: 1.26 mg/dL — ABNORMAL HIGH (ref 0.44–1.00)
GFR calc non Af Amer: 38 mL/min — ABNORMAL LOW (ref >60)
GFR, EST AFRICAN AMERICAN: 44 mL/min — AB (ref >60)
Glucose, Bld: 107 mg/dL — ABNORMAL HIGH (ref 70–99)
POTASSIUM: 4.3 mmol/L (ref 3.5–5.1)
SODIUM: 139 mmol/L (ref 135–145)
Total Bilirubin: 0.8 mg/dL (ref 0.3–1.2)
Total Protein: 6.4 g/dL — ABNORMAL LOW (ref 6.5–8.1)

## 2018-03-20 ENCOUNTER — Encounter: Payer: Self-pay | Admitting: Internal Medicine

## 2018-03-20 NOTE — Progress Notes (Signed)
Location:    [facility] Room Number: 152/W Place of Service:  SNF [phone]) Provider:  Cory Roughen, MD  Patient Care Team: Celene Squibb, MD as PCP - General  Extended Emergency Contact Information Primary Emergency Contact: Nance,Sheila Address: [address]          [place], [place] [address] [place] Phone: [phone] Relation: Daughter Secondary Emergency Contact: Charlynne Pander, [place] [address] [place] Phone: [phone] Relation: Granddaughter  Code Status: DNR Goals of care: Advanced Directive information Advanced Directives 03/20/2018  Does Patient Have a Medical Advance Directive? Yes  Type of Advance Directive Out of facility DNR (pink MOST or yellow form)  Does patient want to make changes to medical advance directive? No - Patient declined  Copy of [place] in Chart? No - copy requested  Would patient like information on creating a medical advance directive? No - Patient declined  Pre-existing out of facility DNR order (yellow form or pink MOST form) -     Chief Complaint  Patient presents with  . Acute Visit    Patients c/o Shoulder pain    HPI:  Pt is a 85 y.o. female seen today for an acute visit for    Past Medical History:  Diagnosis Date  . Anemia   . Atrial fibrillation ([facility])   . Back pain   . Bacterial pneumonia   . Cancer (HCC)    Skin   . Chronic respiratory failure ([facility])   . Cognitive communication deficit   . COPD (chronic obstructive pulmonary disease) ([facility])   . Dementia ([facility])   . Dementia ([facility])   . Depression   . Diabetes mellitus without complication ([facility])   . Diverticulosis   . Diverticulosis   . Fatty liver   . Fibromyalgia   . Fibromyalgia   . Gastric polyp 09/08/11   at the anastomosis-inflammatory  . Generalized anxiety disorder   . Generalized anxiety disorder   . GERD (gastroesophageal reflux disease)   . GERD  (gastroesophageal reflux disease)   . 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 Hypercholesteremia   . Hypertension   . Insomnia   . Internal hemorrhoids   . Muscle weakness   . Neuropathy   . On home O2    qhs  . Osteoporosis   . Rheumatoid arteritis ([facility])   . Ulcer    stomach  . Vitamin D deficiency    Past Surgical History:  Procedure Laterality Date  . ABDOMINAL HYSTERECTOMY    . ABDOMINAL SURGERY     removed patial stomach from ulcer  . COLONOSCOPY  04-2001   internal hemorrhoids, panocolonic diverticulosis, and colon polyps   . UPPER GASTROINTESTINAL ENDOSCOPY      Allergies  Allergen Reactions  . Other     Band Aids   . Tape   . Lipitor [Atorvastatin Calcium] Rash and Other (See Comments)    Muscle pain  . Niaspan [Niacin Er] Rash and Other (See Comments)    Muscle pain    Outpatient Encounter Medications as of 03/20/2018  Medication Sig  . acetaminophen (TYLENOL) 325 MG tablet Take 650 mg by mouth every 8 (eight) hours as needed.  Marland Kitchen albuterol (PROAIR HFA) 108 (90 BASE) MCG/ACT inhaler Inhale 2 puffs into the lungs every 6 (six) hours as needed for wheezing or shortness of breath. For rescue   . apixaban (  ELIQUIS) 2.5 MG TABS tablet Take 2.5 mg by mouth 2 (two) times daily.  Roseanne Kaufman Peru-Castor Oil (VENELEX) OINT Apply to sacrum and bilateral buttocks q shift and prn for prevention  . CARTIA XT 300 MG 24 hr capsule TAKE ONE CAPSULE BY MOUTH EVERY DAY  . donepezil (ARICEPT) 5 MG tablet Take 5 mg by mouth at bedtime.  . DULoxetine (CYMBALTA) 60 MG capsule Take 60 mg by mouth daily.    . Fluticasone-Umeclidin-Vilant (TRELEGY ELLIPTA) 100-62.5-25 MCG/INH AEPB One puff inhalation once a day  . Folic Acid 5 MG CAPS Take 5 mg by mouth every Sunday.  . furosemide (LASIX) 40 MG tablet Take 40 mg by mouth daily.  Marland Kitchen guaifenesin (ROBITUSSIN) 100 MG/5ML syrup Take 200 mg by mouth 4 (four) times daily as needed for cough.  Marland Kitchen HYDROcodone-acetaminophen (NORCO/VICODIN) 5-325  MG tablet Take 1 tablet by mouth daily.  Marland Kitchen ipratropium-albuterol (DUONEB) 0.5-2.5 (3) MG/3ML SOLN Take 3 mLs by nebulization 2 (two) times daily as needed.  . linagliptin (TRADJENTA) 5 MG TABS tablet Take 1 tablet (5 mg total) by mouth daily.  . Menthol, Topical Analgesic, (BIOFREEZE) 4 % GEL Apply to both knees daily as needed and to left shoulder  . methotrexate (RHEUMATREX) 2.5 MG tablet Take 12.5 mg by mouth once a week. Caution:Chemotherapy. Protect from light.  . Multiple Vitamin (DAILY VITE PO) Take 1 tablet by mouth daily.  . pantoprazole (PROTONIX) 40 MG tablet Take 40 mg by mouth 2 (two) times daily.  . phenylephrine-shark liver oil-mineral oil-petrolatum (PREPARATION H) 0.25-3-14-71.9 % rectal ointment Place 1 application rectally 2 (two) times daily as needed for hemorrhoids.  . polyethylene glycol (MIRALAX / GLYCOLAX) packet Take 17 g by mouth daily as needed for moderate constipation.  . potassium chloride SA (K-DUR,KLOR-CON) 20 MEQ tablet Take 2 tablets (40 mg ) by mouth twice a day  . pregabalin (LYRICA) 50 MG capsule Take 1 capsule by mouth once daily  . Propylene Glycol (SYSTANE BALANCE) 0.6 % SOLN Place 1 drop into both eyes 2 (two) times daily.  . raloxifene (EVISTA) 60 MG tablet Take 60 mg by mouth daily.  . rosuvastatin (CRESTOR) 20 MG tablet Take 20 mg by mouth daily.  . simethicone (MYLICON) 80 MG chewable tablet Chew 80 mg by mouth 2 (two) times daily as needed for flatulence.  . [DISCONTINUED] pantoprazole (PROTONIX) 40 MG tablet Take 1 tablet (40 mg total) by mouth daily. (Patient taking differently: Take 40 mg by mouth 2 (two) times daily. )   No facility-administered encounter medications on file as of 03/20/2018.     Review of Systems  Immunization History  Administered Date(s) Administered  . Tdap 10/21/2016   Pertinent  Health Maintenance Due  Topic Date Due  . INFLUENZA VACCINE  03/20/2018 (Originally 10/12/2017)  . URINE MICROALBUMIN  03/31/2018 (Originally  06/05/1938)  . DEXA SCAN  03/31/2018 (Originally 06/06/1995)  . PNA vac Low Risk Adult (1 of 2 - PCV13) 12/29/2018 (Originally 06/06/1995)  . HEMOGLOBIN A1C  04/01/2018  . FOOT EXAM  06/22/2018  . OPHTHALMOLOGY EXAM  07/05/2018   Fall Risk  08/15/2017  Falls in the past year? No   Functional Status Survey:    Vitals:   03/20/18 1016  BP: (!) 117/58  Pulse: 87  Resp: 18  Temp: 97.8 F (36.6 C)  TempSrc: Oral   There is no height or weight on file to calculate BMI. Physical Exam  Labs reviewed: Recent Labs    03/31/17 1852  05/03/17  9604  06/12/17 0727  07/11/17 0714  12/14/17 0700 01/17/18 0747 03/01/18 0754  NA 142   < > 136   < > 140   < >  --    < > 141 140 139  K 3.5   < > 2.8*   < > 3.3*   < >  --    < > 4.1 4.1 4.3  CL 102   < > 93*   < > 102   < >  --    < > 107 102 105  CO2 29   < > 29   < > 30   < >  --    < > 28 30 28   GLUCOSE 165*   < > 296*   < > 171*   < >  --    < > 132* 129* 107*  BUN 17   < > 22*   < > 14   < >  --    < > 15 15 17   CREATININE 1.29*   < > 1.60*   < > 1.19*   < >  --    < > 1.16* 1.18* 1.26*  CALCIUM 9.7   < > 9.3   < > 9.5   < >  --    < > 9.6 9.9 9.7  MG 2.0   < > 1.7  --  1.9  --  1.8  --   --   --   --   PHOS 3.0  --   --   --   --   --   --   --   --   --   --    < > = values in this interval not displayed.   Recent Labs    11/08/17 0706 11/23/17 0731 03/01/18 0754  AST 18 43* 24  ALT 14 43 25  ALKPHOS 82 87 69  BILITOT 0.5 0.6 0.8  PROT 5.9* 6.3* 6.4*  ALBUMIN 3.0* 3.2* 3.3*   Recent Labs    11/18/17 0931 11/19/17 0900 03/01/18 0754  WBC 6.9 8.3 6.3  NEUTROABS 3.1 4.0 3.5  HGB 14.8 14.2 14.3  HCT 46.5* 45.1 48.3*  MCV 85.6 86.4 89.0  PLT 118* 136* 185   Lab Results  Component Value Date   TSH 2.992 07/26/2016   Lab Results  Component Value Date   HGBA1C 6.4 (H) 09/29/2017   No results found for: CHOL, HDL, LDLCALC, LDLDIRECT, TRIG, CHOLHDL  Significant Diagnostic Results in last 30 days:  No results  found.  Assessment/Plan There are no diagnoses linked to this encounter.      Oralia Manis, [place]

## 2018-03-22 NOTE — Progress Notes (Signed)
This encounter was created in error - please disregard.

## 2018-03-23 ENCOUNTER — Encounter: Payer: Self-pay | Admitting: Internal Medicine

## 2018-03-23 NOTE — Progress Notes (Signed)
Location:    [facility] Room Number: 152/W Place of Service:  SNF [phone]) Provider:  Cory Roughen, MD  Patient Care Team: Celene Squibb, MD as PCP - General  Extended Emergency Contact Information Primary Emergency Contact: Nance,Sheila Address: [address]          [place], [place] [address] [place] Phone: [phone] Relation: Daughter Secondary Emergency Contact: Charlynne Pander, [place] [employer] [address] [place] Phone: [phone] Relation: Granddaughter  Code Status:  DNR Goals of care: Advanced Directive information Advanced Directives 03/23/2018  Does Patient Have a Medical Advance Directive? Yes  Type of Advance Directive Out of facility DNR (pink MOST or yellow form)  Does patient want to make changes to medical advance directive? No - Patient declined  Copy of [place] in Chart? No - copy requested  Would patient like information on creating a medical advance directive? No - Patient declined  Pre-existing out of facility DNR order (yellow form or pink MOST form) -     Chief Complaint  Patient presents with  . Acute Visit    Shoulder Pain     HPI:  Pt is a 85 y.o. female seen today for an acute visit for    Past Medical History:  Diagnosis Date  . Anemia   . Atrial fibrillation ([place])   . Back pain   . Bacterial pneumonia   . Cancer (HCC)    Skin   . Chronic respiratory failure ([place])   . Cognitive communication deficit   . COPD (chronic obstructive pulmonary disease) ([place])   . Dementia ([place])   . Dementia ([place])   . Depression   . Diabetes mellitus without complication ([place])   . Diverticulosis   . Diverticulosis   . Fatty liver   . Fibromyalgia   . Fibromyalgia   . Gastric polyp 09/08/11   at the anastomosis-inflammatory  . Generalized anxiety disorder   . Generalized anxiety disorder   . GERD (gastroesophageal reflux disease)   . GERD  (gastroesophageal reflux disease)   . 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 Hypercholesteremia   . Hypertension   . Insomnia   . Internal hemorrhoids   . Muscle weakness   . Neuropathy   . On home O2    qhs  . Osteoporosis   . Rheumatoid arteritis ([place])   . Ulcer    stomach  . Vitamin D deficiency    Past Surgical History:  Procedure Laterality Date  . ABDOMINAL HYSTERECTOMY    . ABDOMINAL SURGERY     removed patial stomach from ulcer  . COLONOSCOPY  04-2001   internal hemorrhoids, panocolonic diverticulosis, and colon polyps   . UPPER GASTROINTESTINAL ENDOSCOPY      Allergies  Allergen Reactions  . Other     Band Aids   . Tape   . Lipitor [Atorvastatin Calcium] Rash and Other (See Comments)    Muscle pain  . Niaspan [Niacin Er] Rash and Other (See Comments)    Muscle pain    Outpatient Encounter Medications as of 03/23/2018  Medication Sig  . acetaminophen (TYLENOL) 325 MG tablet Take 650 mg by mouth every 8 (eight) hours as needed.  Marland Kitchen albuterol (PROAIR HFA) 108 (90 BASE) MCG/ACT inhaler Inhale 2 puffs into the lungs every 6 (six) hours as needed for wheezing or shortness of breath. For rescue   . apixaban (  ELIQUIS) 2.5 MG TABS tablet Take 2.5 mg by mouth 2 (two) times daily.  Roseanne Kaufman Peru-Castor Oil (VENELEX) OINT Apply to sacrum and bilateral buttocks q shift and prn for prevention  . CARTIA XT 300 MG 24 hr capsule TAKE ONE CAPSULE BY MOUTH EVERY DAY  . donepezil (ARICEPT) 5 MG tablet Take 5 mg by mouth at bedtime.  . DULoxetine (CYMBALTA) 60 MG capsule Take 60 mg by mouth daily.    . Fluticasone-Umeclidin-Vilant (TRELEGY ELLIPTA) 100-62.5-25 MCG/INH AEPB One puff inhalation once a day  . Folic Acid 5 MG CAPS Take 5 mg by mouth every Sunday.  . furosemide (LASIX) 40 MG tablet Take 40 mg by mouth daily.  Marland Kitchen guaifenesin (ROBITUSSIN) 100 MG/5ML syrup Take 200 mg by mouth 4 (four) times daily as needed for cough.  Marland Kitchen HYDROcodone-acetaminophen (NORCO/VICODIN) 5-325  MG tablet Take 1 tablet by mouth daily.  Marland Kitchen ipratropium-albuterol (DUONEB) 0.5-2.5 (3) MG/3ML SOLN Take 3 mLs by nebulization 2 (two) times daily as needed.  . linagliptin (TRADJENTA) 5 MG TABS tablet Take 1 tablet (5 mg total) by mouth daily.  . Menthol, Topical Analgesic, (BIOFREEZE) 4 % GEL Apply to both knees daily as needed and to left shoulder  . methotrexate (RHEUMATREX) 2.5 MG tablet Take 12.5 mg by mouth once a week. Caution:Chemotherapy. Protect from light.  . Multiple Vitamin (DAILY VITE PO) Take 1 tablet by mouth daily.  . pantoprazole (PROTONIX) 40 MG tablet Take 40 mg by mouth 2 (two) times daily.  . phenylephrine-shark liver oil-mineral oil-petrolatum (PREPARATION H) 0.25-3-14-71.9 % rectal ointment Place 1 application rectally 2 (two) times daily as needed for hemorrhoids.  . polyethylene glycol (MIRALAX / GLYCOLAX) packet Take 17 g by mouth daily as needed for moderate constipation.  . potassium chloride SA (K-DUR,KLOR-CON) 20 MEQ tablet Take 2 tablets (40 mg ) by mouth twice a day  . pregabalin (LYRICA) 50 MG capsule Take 1 capsule by mouth once daily  . Propylene Glycol (SYSTANE BALANCE) 0.6 % SOLN Place 1 drop into both eyes 2 (two) times daily.  . raloxifene (EVISTA) 60 MG tablet Take 60 mg by mouth daily.  . rosuvastatin (CRESTOR) 20 MG tablet Take 20 mg by mouth daily.  . simethicone (MYLICON) 80 MG chewable tablet Chew 80 mg by mouth 2 (two) times daily as needed for flatulence.   No facility-administered encounter medications on file as of 03/23/2018.     Review of Systems  Immunization History  Administered Date(s) Administered  . Tdap 10/21/2016   Pertinent  Health Maintenance Due  Topic Date Due  . URINE MICROALBUMIN  03/31/2018 (Originally 06/05/1938)  . DEXA SCAN  03/31/2018 (Originally 06/06/1995)  . INFLUENZA VACCINE  04/23/2018 (Originally 10/12/2017)  . PNA vac Low Risk Adult (1 of 2 - PCV13) 12/29/2018 (Originally 06/06/1995)  . HEMOGLOBIN A1C  04/01/2018    . FOOT EXAM  06/22/2018  . OPHTHALMOLOGY EXAM  07/05/2018   Fall Risk  08/15/2017  Falls in the past year? No   Functional Status Survey:    Vitals:   03/23/18 1444  BP: 106/72  Pulse: 70  Resp: 20  Temp: (!) 97.4 F (36.3 C)  TempSrc: Oral  SpO2: 94%   There is no height or weight on file to calculate BMI. Physical Exam  Labs reviewed: Recent Labs    03/31/17 1852  05/03/17 0914  06/12/17 0727  07/11/17 0714  12/14/17 0700 01/17/18 0747 03/01/18 0754  NA 142   < > 136   < >  140   < >  --    < > 141 140 139  K 3.5   < > 2.8*   < > 3.3*   < >  --    < > 4.1 4.1 4.3  CL 102   < > 93*   < > 102   < >  --    < > 107 102 105  CO2 29   < > 29   < > 30   < >  --    < > 28 30 28   GLUCOSE 165*   < > 296*   < > 171*   < >  --    < > 132* 129* 107*  BUN 17   < > 22*   < > 14   < >  --    < > 15 15 17   CREATININE 1.29*   < > 1.60*   < > 1.19*   < >  --    < > 1.16* 1.18* 1.26*  CALCIUM 9.7   < > 9.3   < > 9.5   < >  --    < > 9.6 9.9 9.7  MG 2.0   < > 1.7  --  1.9  --  1.8  --   --   --   --   PHOS 3.0  --   --   --   --   --   --   --   --   --   --    < > = values in this interval not displayed.   Recent Labs    11/08/17 0706 11/23/17 0731 03/01/18 0754  AST 18 43* 24  ALT 14 43 25  ALKPHOS 82 87 69  BILITOT 0.5 0.6 0.8  PROT 5.9* 6.3* 6.4*  ALBUMIN 3.0* 3.2* 3.3*   Recent Labs    11/18/17 0931 11/19/17 0900 03/01/18 0754  WBC 6.9 8.3 6.3  NEUTROABS 3.1 4.0 3.5  HGB 14.8 14.2 14.3  HCT 46.5* 45.1 48.3*  MCV 85.6 86.4 89.0  PLT 118* 136* 185   Lab Results  Component Value Date   TSH 2.992 07/26/2016   Lab Results  Component Value Date   HGBA1C 6.4 (H) 09/29/2017   No results found for: CHOL, HDL, LDLCALC, LDLDIRECT, TRIG, CHOLHDL  Significant Diagnostic Results in last 30 days:  No results found.  Assessment/Plan There are no diagnoses linked to this encounter.      Oralia Manis, [place] [phone]  This encounter was created in error -  please disregard.

## 2018-04-12 ENCOUNTER — Other Ambulatory Visit: Payer: Self-pay

## 2018-04-12 MED ORDER — HYDROCODONE-ACETAMINOPHEN 5-325 MG PO TABS: 1.0000 | ORAL_TABLET | Freq: Every day | ORAL | 0 refills | Status: DC

## 2018-04-16 ENCOUNTER — Other Ambulatory Visit: Payer: Self-pay

## 2018-04-16 MED ORDER — HYDROCODONE-ACETAMINOPHEN 5-325 MG PO TABS: 1.0000 | ORAL_TABLET | Freq: Every day | ORAL | 0 refills | Status: DC

## 2018-04-30 ENCOUNTER — Non-Acute Institutional Stay (SKILLED_NURSING_FACILITY): Payer: Medicare Other | Admitting: Adult Health

## 2018-04-30 ENCOUNTER — Encounter: Payer: Self-pay | Admitting: Adult Health

## 2018-04-30 DIAGNOSIS — K219 Gastro-esophageal reflux disease without esophagitis: Secondary | ICD-10-CM | POA: Diagnosis not present

## 2018-04-30 DIAGNOSIS — I5032 Chronic diastolic (congestive) heart failure: Secondary | ICD-10-CM

## 2018-04-30 DIAGNOSIS — F0150 Vascular dementia without behavioral disturbance: Secondary | ICD-10-CM | POA: Diagnosis not present

## 2018-04-30 DIAGNOSIS — E785 Hyperlipidemia, unspecified: Secondary | ICD-10-CM

## 2018-04-30 DIAGNOSIS — J449 Chronic obstructive pulmonary disease, unspecified: Secondary | ICD-10-CM

## 2018-04-30 DIAGNOSIS — K5909 Other constipation: Secondary | ICD-10-CM

## 2018-04-30 DIAGNOSIS — M0520 Rheumatoid vasculitis with rheumatoid arthritis of unspecified site: Secondary | ICD-10-CM

## 2018-04-30 DIAGNOSIS — E1169 Type 2 diabetes mellitus with other specified complication: Secondary | ICD-10-CM

## 2018-04-30 DIAGNOSIS — N183 Chronic kidney disease, stage 3 unspecified: Secondary | ICD-10-CM

## 2018-04-30 DIAGNOSIS — I4891 Unspecified atrial fibrillation: Secondary | ICD-10-CM | POA: Diagnosis not present

## 2018-04-30 DIAGNOSIS — M810 Age-related osteoporosis without current pathological fracture: Secondary | ICD-10-CM | POA: Diagnosis not present

## 2018-04-30 DIAGNOSIS — E1122 Type 2 diabetes mellitus with diabetic chronic kidney disease: Secondary | ICD-10-CM

## 2018-04-30 DIAGNOSIS — E1142 Type 2 diabetes mellitus with diabetic polyneuropathy: Secondary | ICD-10-CM

## 2018-04-30 NOTE — Progress Notes (Signed)
Location:   [facility] Room Number: [place] of Service:  SNF (31)   CODE STATUS: DNR  Allergies  Allergen Reactions  . Other     Band Aids   . Tape   . Lipitor [Atorvastatin Calcium] Rash and Other (See Comments)    Muscle pain  . Niaspan [Niacin Er] Rash and Other (See Comments)    Muscle pain    Chief Complaint  Patient presents with  . Medical Management of Chronic Issues    Atrial fibrillation with RVR; chronic diastolic heart failure; Rheumatoid arteritis     HPI:  She is 85 year old long term resident of this facility being seen for the management of her chronic illnesses: atrial fibrillation; chronic diastolic heart failure; rheumatoid arteritis. She denies any uncontrolled pain; no changes in appetite; no chest pain no shortness of breath.   Past Medical History:  Diagnosis Date  . Anemia   . Atrial fibrillation ([place])   . Back pain   . Bacterial pneumonia   . Cancer (HCC)    Skin   . Chronic respiratory failure ([place])   . Cognitive communication deficit   . COPD (chronic obstructive pulmonary disease) ([place])   . Dementia ([place])   . Dementia ([place])   . Depression   . Diabetes mellitus without complication ([place])   . Diverticulosis   . Diverticulosis   . Fatty liver   . Fibromyalgia   . Fibromyalgia   . Gastric polyp 09/08/11   at the anastomosis-inflammatory  . Generalized anxiety disorder   . Generalized anxiety disorder   . GERD (gastroesophageal reflux disease)   . GERD (gastroesophageal reflux disease)   . 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 Hypercholesteremia   . Hypertension   . Insomnia   . Internal hemorrhoids   . Muscle weakness   . Neuropathy   . On home O2    qhs  . Osteoporosis   . Rheumatoid arteritis ([place])   . Ulcer    stomach  . Vitamin D deficiency     Past Surgical History:  Procedure Laterality Date  . ABDOMINAL HYSTERECTOMY    . ABDOMINAL SURGERY     removed patial stomach from  ulcer  . COLONOSCOPY  04-2001   internal hemorrhoids, panocolonic diverticulosis, and colon polyps   . UPPER GASTROINTESTINAL ENDOSCOPY      Social History   Socioeconomic History  . Marital status: Widowed    Spouse name: Not on file  . Number of children: 6  . Years of education: Not on file  . Highest education level: Not on file  Occupational History  . Occupation: Retired  Scientific laboratory technician  . Financial resource strain: Not hard at all  . Food insecurity:    Worry: Never true    Inability: Never true  . Transportation needs:    Medical: No    Non-medical: No  Tobacco Use  . Smoking status: Never Smoker  . Smokeless tobacco: Never Used  Substance and Sexual Activity  . Alcohol use: No  . Drug use: No  . Sexual activity: Not on file  Lifestyle  . Physical activity:    Days per week: 0 days    Minutes per session: 0 min  . Stress: Not at all  Relationships  . Social connections:    Talks on phone: Three times a week    Gets together: Three times a week    Attends religious service: Never    Active  member of club or organization: No    Attends meetings of clubs or organizations: Never    Relationship status: Widowed  . Intimate partner violence:    Fear of current or ex partner: No    Emotionally abused: No    Physically abused: No    Forced sexual activity: No  Other Topics Concern  . Not on file  Social History Narrative   Daily caffeine    Family History  Problem Relation Age of Onset  . Heart disease Father   . Kidney disease Mother        kidney cancer  . Colon cancer Daughter 25      VITAL SIGNS BP 129/84   Pulse 74   Temp 97.6 F (36.4 C)   Resp 18   Ht 5\' 3"  (1.6 m)   Wt 191 lb (86.6 kg)   SpO2 95%   BMI 33.83 kg/m   Outpatient Encounter Medications as of 04/30/2018  Medication Sig  . acetaminophen (TYLENOL) 325 MG tablet Take 650 mg by mouth every 8 (eight) hours as needed.  Marland Kitchen albuterol (PROAIR HFA) 108 (90 BASE) MCG/ACT inhaler Inhale  2 puffs into the lungs every 6 (six) hours as needed for wheezing or shortness of breath. For rescue   . apixaban (ELIQUIS) 2.5 MG TABS tablet Take 2.5 mg by mouth 2 (two) times daily.  Roseanne Kaufman Peru-Castor Oil (VENELEX) OINT Apply to sacrum and bilateral buttocks q shift and prn for prevention  . CARTIA XT 300 MG 24 hr capsule TAKE ONE CAPSULE BY MOUTH EVERY DAY  . donepezil (ARICEPT) 5 MG tablet Take 5 mg by mouth at bedtime.  . DULoxetine (CYMBALTA) 60 MG capsule Take 60 mg by mouth daily.    . Fluticasone-Umeclidin-Vilant (TRELEGY ELLIPTA) 100-62.5-25 MCG/INH AEPB One puff inhalation once a day   . Folic Acid 5 MG CAPS Take 5 mg by mouth every Sunday.  . furosemide (LASIX) 40 MG tablet Take 40 mg by mouth daily.  Marland Kitchen guaifenesin (ROBITUSSIN) 100 MG/5ML syrup Take 200 mg by mouth 4 (four) times daily as needed for cough.  Marland Kitchen HYDROcodone-acetaminophen (NORCO/VICODIN) 5-325 MG tablet Take 1 tablet by mouth daily.  Marland Kitchen ipratropium-albuterol (DUONEB) 0.5-2.5 (3) MG/3ML SOLN Take 3 mLs by nebulization 2 (two) times daily as needed.  . linagliptin (TRADJENTA) 5 MG TABS tablet Take 1 tablet (5 mg total) by mouth daily.  . Menthol, Topical Analgesic, (BIOFREEZE) 4 % GEL Apply to both knees daily as needed and to left shoulder  . methotrexate (RHEUMATREX) 2.5 MG tablet Take 12.5 mg by mouth once a week. Caution:Chemotherapy. Protect from light.  . Multiple Vitamin (DAILY VITE PO) Take 1 tablet by mouth daily.  . NON FORMULARY Diet Type:  NAS, Consistent Carbohydrates, Dysphagia 3  . OXYGEN Inhale 3.5 L/min into the lungs continuous.  . pantoprazole (PROTONIX) 40 MG tablet Take 40 mg by mouth 2 (two) times daily.  . phenylephrine-shark liver oil-mineral oil-petrolatum (PREPARATION H) 0.25-3-14-71.9 % rectal ointment Place 1 application rectally 2 (two) times daily as needed for hemorrhoids.  . polyethylene glycol (MIRALAX / GLYCOLAX) packet Take 17 g by mouth daily as needed for moderate constipation.  .  potassium chloride SA (K-DUR,KLOR-CON) 20 MEQ tablet Take 2 tablets (40 mg ) by mouth twice a day  . pregabalin (LYRICA) 50 MG capsule Take 1 capsule by mouth once daily  . Propylene Glycol (SYSTANE BALANCE) 0.6 % SOLN Place 1 drop into both eyes 2 (two) times daily.  . raloxifene (  EVISTA) 60 MG tablet Take 60 mg by mouth daily.  . rosuvastatin (CRESTOR) 20 MG tablet Take 20 mg by mouth daily.  . simethicone (MYLICON) 80 MG chewable tablet Chew 80 mg by mouth 2 (two) times daily as needed for flatulence.  . Skin Protectants, Misc. (NO-STING SKIN-PREP EX) Apply externally to bilateral heels every shift for prevention   No facility-administered encounter medications on file as of 04/30/2018.      SIGNIFICANT DIAGNOSTIC EXAMS  LABS REVIEWED TODAY:   09-29-17: hgb a1c 6.4 11-08-17: Rheumatoid factor 29.6 (elevated) uric acid 3.6 03-01-18: wbc  6.3; hgb 14.3; hct 48.3; mcv 89.0 plt 183; glucose 107; bun 17; creat 1.26; k+ 4.3; na++ 139; ca 9.7 liver normal albumin 3.3     Review of Systems  Constitutional: Negative for malaise/fatigue.  Respiratory: Negative for cough and shortness of breath.   Cardiovascular: Negative for chest pain, palpitations and leg swelling.  Gastrointestinal: Negative for abdominal pain, constipation and heartburn.  Musculoskeletal: Negative for back pain, joint pain and myalgias.  Skin: Negative.   Neurological: Negative for dizziness.  Psychiatric/Behavioral: The patient is not nervous/anxious.     Physical Exam Constitutional:      General: She is not in acute distress.    Appearance: She is well-developed. She is not diaphoretic.  Neck:     Musculoskeletal: Neck supple.     Thyroid: No thyromegaly.  Cardiovascular:     Rate and Rhythm: Normal rate and regular rhythm.     Pulses: Normal pulses.     Heart sounds: Normal heart sounds.  Pulmonary:     Effort: Pulmonary effort is normal. No respiratory distress.     Breath sounds: Normal breath sounds.    Abdominal:     General: Bowel sounds are normal. There is no distension.     Palpations: Abdomen is soft.     Tenderness: There is no abdominal tenderness.  Musculoskeletal:     Right lower leg: No edema.     Left lower leg: No edema.     Comments: Unable to move left upper extremity due to frozen left shoulder Able to move other extremities   Lymphadenopathy:     Cervical: No cervical adenopathy.  Skin:    General: Skin is warm and dry.  Neurological:     Mental Status: She is alert. Mental status is at baseline.  Psychiatric:        Mood and Affect: Mood normal.       ASSESSMENT/ PLAN:  TODAY:   1. Atrial fibrillation with RVR: heart rate is stable: will continue eliquis 2.5 mg twice daily; will continue cartia xt 300 mg daily   2. Chronic diastolic heart failure: EF 55-60% (07-29-26): is stable will continue lasix 40 mg daily with k+ 40 meq twice daily   3. Hypokalemia: is stable k+ 4.3; will continue k+ 40 meq twice daily   4. Rheumatoid arteritis: stable; will continue methotrexate 12.5 mg weekly with folic acid 5 mg weekly   5. Unspecified COPD/ chronic respiratory failure with hypoxia: is stable is 02 dependent; will continue trelegy ellipta 100-62.5-25 mcg 1 puff daily; albuterol 2 puffs every 6 hours as needed; and duoneb twice daily as needed  6. GERD without esophagitis: is stable will continue protonix 40 mg twice daily   7. Type 2 diabetes; controlled with peripheral neuropathy: is stable hgb a1c 6.4; will continue tradjenta 5 mg daily   8. Dyslipidemia associated with type 2 diabetes mellitus: is stable will continue crestor  20 mg daily   9. Diabetic peripheral neuropathy associated with type 2 diabetes mellitus: is stable will continue lyrica 75 mg daily and vicodin 5/325 mg daily   10. Vascular dementia without behavioral disturbance; is without change; weight is 191 pounds; will continue aricept 5 mg daily   11. CKD stage 3 due to type 2 diabetes  mellitus: is stable bun17; creat 1.26  12. Fibromyalgia: is stable will continue lyrica 75 mg daily   13. Post-menopausal osteoporosis: is stable will continue evista 60 mg daily does take supplements   14. Chronic constipation: is stable will continue miralax daily as needed     MD is aware of resident's narcotic use and is in agreement with current plan of care. We will attempt to wean resident as apropriate   Ok Edwards NP [facility]  Contact [phone] Monday through Friday 8am- 5pm  After hours call [phone]

## 2018-05-01 ENCOUNTER — Encounter (HOSPITAL_COMMUNITY)
Admission: RE | Admit: 2018-05-01 | Discharge: 2018-05-01 | Disposition: A | Discharge status: Home / Self Care | Payer: Medicare Other | Source: Skilled Nursing Facility | Attending: Internal Medicine | Admitting: Internal Medicine

## 2018-05-01 DIAGNOSIS — J449 Chronic obstructive pulmonary disease, unspecified: Secondary | ICD-10-CM | POA: Insufficient documentation

## 2018-05-01 DIAGNOSIS — I4819 Other persistent atrial fibrillation: Secondary | ICD-10-CM | POA: Insufficient documentation

## 2018-05-01 DIAGNOSIS — I129 Hypertensive chronic kidney disease with stage 1 through stage 4 chronic kidney disease, or unspecified chronic kidney disease: Secondary | ICD-10-CM | POA: Diagnosis not present

## 2018-05-01 LAB — COMPREHENSIVE METABOLIC PANEL
ALT: 23 U/L (ref 0–44)
AST: 32 U/L (ref 15–41)
Albumin: 3.4 g/dL — ABNORMAL LOW (ref 3.5–5.0)
Alkaline Phosphatase: 64 U/L (ref 38–126)
Anion gap: 7 (ref 5–15)
BILIRUBIN TOTAL: 0.7 mg/dL (ref 0.3–1.2)
BUN: 12 mg/dL (ref 8–23)
CO2: 27 mmol/L (ref 22–32)
CREATININE: 0.86 mg/dL (ref 0.44–1.00)
Calcium: 9.7 mg/dL (ref 8.9–10.3)
Chloride: 107 mmol/L (ref 98–111)
Glucose, Bld: 125 mg/dL — ABNORMAL HIGH (ref 70–99)
POTASSIUM: 4.1 mmol/L (ref 3.5–5.1)
Sodium: 141 mmol/L (ref 135–145)
TOTAL PROTEIN: 6.1 g/dL — AB (ref 6.5–8.1)

## 2018-05-01 LAB — CBC WITH DIFFERENTIAL/PLATELET
Abs Immature Granulocytes: 0.01 10*3/uL (ref 0.00–0.07)
Basophils Absolute: 0 10*3/uL (ref 0.0–0.1)
Basophils Relative: 1 %
EOS PCT: 3 %
Eosinophils Absolute: 0.2 10*3/uL (ref 0.0–0.5)
HEMATOCRIT: 46.5 % — AB (ref 36.0–46.0)
Hemoglobin: 13.9 g/dL (ref 12.0–15.0)
Immature Granulocytes: 0 %
LYMPHS ABS: 1.9 10*3/uL (ref 0.7–4.0)
Lymphocytes Relative: 35 %
MCH: 27.3 pg (ref 26.0–34.0)
MCHC: 29.9 g/dL — ABNORMAL LOW (ref 30.0–36.0)
MCV: 91.2 fL (ref 80.0–100.0)
MONO ABS: 0.4 10*3/uL (ref 0.1–1.0)
Monocytes Relative: 7 %
NEUTROS PCT: 54 %
NRBC: 0 % (ref 0.0–0.2)
Neutro Abs: 2.9 10*3/uL (ref 1.7–7.7)
Platelets: 125 10*3/uL — ABNORMAL LOW (ref 150–400)
RBC: 5.1 MIL/uL (ref 3.87–5.11)
RDW: 16.4 % — ABNORMAL HIGH (ref 11.5–15.5)
WBC: 5.3 10*3/uL (ref 4.0–10.5)

## 2018-05-01 LAB — LIPID PANEL
CHOL/HDL RATIO: 3.3 ratio
CHOLESTEROL: 127 mg/dL (ref 0–200)
HDL: 39 mg/dL — ABNORMAL LOW (ref >40)
LDL Cholesterol: 66 mg/dL (ref 0–99)
TRIGLYCERIDES: 108 mg/dL (ref <150)
VLDL: 22 mg/dL (ref 0–40)

## 2018-05-03 DIAGNOSIS — K5909 Other constipation: Secondary | ICD-10-CM | POA: Insufficient documentation

## 2018-05-03 DIAGNOSIS — F0150 Vascular dementia without behavioral disturbance: Secondary | ICD-10-CM | POA: Insufficient documentation

## 2018-05-03 DIAGNOSIS — E785 Hyperlipidemia, unspecified: Secondary | ICD-10-CM

## 2018-05-03 DIAGNOSIS — E1169 Type 2 diabetes mellitus with other specified complication: Secondary | ICD-10-CM | POA: Insufficient documentation

## 2018-05-03 DIAGNOSIS — E1142 Type 2 diabetes mellitus with diabetic polyneuropathy: Secondary | ICD-10-CM | POA: Insufficient documentation

## 2018-05-03 DIAGNOSIS — M810 Age-related osteoporosis without current pathological fracture: Secondary | ICD-10-CM | POA: Insufficient documentation

## 2018-05-09 ENCOUNTER — Other Ambulatory Visit: Payer: Self-pay | Admitting: Adult Health

## 2018-05-09 MED ORDER — HYDROCODONE-ACETAMINOPHEN 5-325 MG PO TABS: 1.0000 | ORAL_TABLET | Freq: Every day | ORAL | 0 refills | Status: DC

## 2018-05-16 DIAGNOSIS — L603 Nail dystrophy: Secondary | ICD-10-CM | POA: Diagnosis not present

## 2018-05-16 DIAGNOSIS — E1051 Type 1 diabetes mellitus with diabetic peripheral angiopathy without gangrene: Secondary | ICD-10-CM | POA: Diagnosis not present

## 2018-05-16 DIAGNOSIS — Z794 Long term (current) use of insulin: Secondary | ICD-10-CM | POA: Diagnosis not present

## 2018-05-16 DIAGNOSIS — B351 Tinea unguium: Secondary | ICD-10-CM | POA: Diagnosis not present

## 2018-05-28 ENCOUNTER — Other Ambulatory Visit: Payer: Self-pay | Admitting: Adult Health

## 2018-05-28 MED ORDER — PREGABALIN 50 MG PO CAPS: ORAL_CAPSULE | ORAL | 0 refills | Status: DC

## 2018-05-29 ENCOUNTER — Other Ambulatory Visit: Payer: Self-pay | Admitting: Adult Health

## 2018-05-31 ENCOUNTER — Encounter: Payer: Self-pay | Admitting: Adult Health

## 2018-05-31 ENCOUNTER — Non-Acute Institutional Stay (SKILLED_NURSING_FACILITY): Payer: Medicare Other | Admitting: Adult Health

## 2018-05-31 DIAGNOSIS — E1142 Type 2 diabetes mellitus with diabetic polyneuropathy: Secondary | ICD-10-CM | POA: Diagnosis not present

## 2018-05-31 DIAGNOSIS — E785 Hyperlipidemia, unspecified: Secondary | ICD-10-CM

## 2018-05-31 DIAGNOSIS — E1169 Type 2 diabetes mellitus with other specified complication: Secondary | ICD-10-CM | POA: Diagnosis not present

## 2018-05-31 NOTE — Progress Notes (Signed)
Provider:  Ok Edwards, NP Location:  Central Room Number: [place] of Service:  SNF [phone])   PCP: Celene Squibb, MD Patient Care Team: Celene Squibb, MD as PCP - General  Extended Emergency Contact Information Primary Emergency Contact: Nance,Sheila Address: [address]          [place], [place] [address] [place] Phone: [phone] Relation: Daughter Secondary Emergency Contact: Charlynne Pander, [place] [address] [place] Phone: [phone] Relation: Granddaughter  Code Status: DNR Goals of Care: Advanced Directive information Advanced Directives 05/31/2018  Does Patient Have a Medical Advance Directive? Yes  Type of Advance Directive Out of facility DNR (pink MOST or yellow form)  Does patient want to make changes to medical advance directive? No - Patient declined  Copy of [place] in Chart? -  Would patient like information on creating a medical advance directive? No - Patient declined  Pre-existing out of facility DNR order (yellow form or pink MOST form) Yellow form placed in chart (order not valid for inpatient use)      Allergies  Allergen Reactions  . Other     Band Aids   . Tape   . Lipitor [Atorvastatin Calcium] Rash and Other (See Comments)    Muscle pain  . Niaspan [Niacin Er] Rash and Other (See Comments)    Muscle pain     Chief Complaint  Patient presents with  . Annual Exam    HPI: Patient is a 85 y.o. female seen today for an annual comprehensive examination. She had not had any hospitalization; she has not had any falls. Her weight has remained stable over the past year. She denies any uncontrolled pain; no changes in her appetite; no insomnia; no anxiety. There are no reports of fevers present. She will continue to be followed for her chronic illnesses including: diabetes dyslipidemia; peripheral neuropathy.   Past Medical History:   Diagnosis Date  . Anemia   . Atrial fibrillation ([place])   . Back pain   . Bacterial pneumonia   . Cancer (HCC)    Skin   . Chronic respiratory failure ([place])   . Cognitive communication deficit   . COPD (chronic obstructive pulmonary disease) ([place])   . Dementia ([place])   . Dementia ([place])   . Depression   . Diabetes mellitus without complication ([place])   . Diverticulosis   . Diverticulosis   . Fatty liver   . Fibromyalgia   . Fibromyalgia   . Gastric polyp 09/08/11   at the anastomosis-inflammatory  . Generalized anxiety disorder   . Generalized anxiety disorder   . GERD (gastroesophageal reflux disease)   . GERD (gastroesophageal reflux disease)   . 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 Hypercholesteremia   . Hypertension   . Insomnia   . Internal hemorrhoids   . Muscle weakness   . Neuropathy   . On home O2    qhs  . Osteoporosis   . Rheumatoid arteritis ([place])   . Ulcer    stomach  . Vitamin D deficiency    Past Surgical History:  Procedure Laterality Date  . ABDOMINAL HYSTERECTOMY    . ABDOMINAL SURGERY     removed patial stomach from ulcer  . COLONOSCOPY  04-2001   internal hemorrhoids, panocolonic diverticulosis, and colon polyps   . UPPER GASTROINTESTINAL ENDOSCOPY      reports that  she has never smoked. She has never used smokeless tobacco. She reports that she does not drink alcohol or use drugs. Social History   Socioeconomic History  . Marital status: Widowed    Spouse name: Not on file  . Number of children: 6  . Years of education: Not on file  . Highest education level: Not on file  Occupational History  . Occupation: Retired  Scientific laboratory technician  . Financial resource strain: Not hard at all  . Food insecurity:    Worry: Never true    Inability: Never true  . Transportation needs:    Medical: No    Non-medical: No  Tobacco Use  . Smoking status: Never Smoker  . Smokeless tobacco: Never Used  Substance and Sexual Activity  . Alcohol use: No   . Drug use: No  . Sexual activity: Not on file  Lifestyle  . Physical activity:    Days per week: 0 days    Minutes per session: 0 min  . Stress: Not at all  Relationships  . Social connections:    Talks on phone: Three times a week    Gets together: Three times a week    Attends religious service: Never    Active member of club or organization: No    Attends meetings of clubs or organizations: Never    Relationship status: Widowed  . Intimate partner violence:    Fear of current or ex partner: No    Emotionally abused: No    Physically abused: No    Forced sexual activity: No  Other Topics Concern  . Not on file  Social History Narrative   Daily caffeine    Family History  Problem Relation Age of Onset  . Heart disease Father   . Kidney disease Mother        kidney cancer  . Colon cancer Daughter 42    Vitals:   05/31/18 1259  BP: (!) 142/82  Pulse: 61  Resp: 18  Temp: (!) 97.1 F (36.2 C)  SpO2: 96%  Weight: 190 lb 6.4 oz (86.4 kg)  Height: 5\' 3"  (1.6 m)   Body mass index is 33.73 kg/m.   Outpatient Encounter Medications as of 05/31/2018  Medication Sig  . acetaminophen (TYLENOL) 325 MG tablet Take 650 mg by mouth every 8 (eight) hours as needed.  Marland Kitchen albuterol (PROAIR HFA) 108 (90 BASE) MCG/ACT inhaler Inhale 2 puffs into the lungs every 6 (six) hours as needed for wheezing or shortness of breath. For rescue   . apixaban (ELIQUIS) 2.5 MG TABS tablet Take 2.5 mg by mouth 2 (two) times daily.  Roseanne Kaufman [place]-Castor Oil (VENELEX) OINT Apply to sacrum and bilateral buttocks q shift and prn for prevention  . CARTIA XT 300 MG 24 hr capsule TAKE ONE CAPSULE BY MOUTH EVERY DAY  . donepezil (ARICEPT) 5 MG tablet Take 5 mg by mouth at bedtime.  . DULoxetine (CYMBALTA) 60 MG capsule Take 60 mg by mouth daily.    . Fluticasone-Umeclidin-Vilant (TRELEGY ELLIPTA) 100-62.5-25 MCG/INH AEPB One puff inhalation once a day   . Folic Acid 5 MG CAPS Take 5 mg by mouth every  Sunday.  . furosemide (LASIX) 40 MG tablet Take 40 mg by mouth daily.  Marland Kitchen guaifenesin (ROBITUSSIN) 100 MG/5ML syrup Take 200 mg by mouth 4 (four) times daily as needed for cough.  Marland Kitchen HYDROcodone-acetaminophen (NORCO/VICODIN) 5-325 MG tablet Take 1 tablet by mouth daily.  Marland Kitchen ipratropium-albuterol (DUONEB) 0.5-2.5 (3) MG/3ML SOLN Take 3  mLs by nebulization 2 (two) times daily as needed.  . linagliptin (TRADJENTA) 5 MG TABS tablet Take 1 tablet (5 mg total) by mouth daily.  . Menthol, Topical Analgesic, (BIOFREEZE) 4 % GEL Apply to both knees daily as needed and to left shoulder  . methotrexate (RHEUMATREX) 2.5 MG tablet Take 12.5 mg by mouth once a week. On Sunday - Caution:Chemotherapy. Protect from light.  . Multiple Vitamin (DAILY VITE PO) Take 1 tablet by mouth daily.  . NON FORMULARY Diet Type:  NAS, Consistent Carbohydrates, Dysphagia 3  . OXYGEN Inhale 3.5 L/min into the lungs continuous.  . pantoprazole (PROTONIX) 40 MG tablet Take 40 mg by mouth 2 (two) times daily.  . phenylephrine-shark liver oil-mineral oil-petrolatum (PREPARATION H) 0.25-3-14-71.9 % rectal ointment Place 1 application rectally 2 (two) times daily as needed for hemorrhoids.  . polyethylene glycol (MIRALAX / GLYCOLAX) packet Take 17 g by mouth daily as needed for moderate constipation.  . potassium chloride SA (K-DUR,KLOR-CON) 20 MEQ tablet Take 2 tablets (40 mg ) by mouth twice a day  . pregabalin (LYRICA) 50 MG capsule Take 1 capsule by mouth once daily  . Propylene Glycol (SYSTANE BALANCE) 0.6 % SOLN Place 1 drop into both eyes 2 (two) times daily.  . raloxifene (EVISTA) 60 MG tablet Take 60 mg by mouth daily.  . rosuvastatin (CRESTOR) 20 MG tablet Take 20 mg by mouth daily.  . simethicone (MYLICON) 80 MG chewable tablet Chew 80 mg by mouth 2 (two) times daily as needed for flatulence.  . Skin Protectants, Misc. (NO-STING SKIN-PREP EX) Apply externally to bilateral heels every shift for prevention   No  facility-administered encounter medications on file as of 05/31/2018.      SIGNIFICANT DIAGNOSTIC EXAMS  LABS REVIEWED PREVIOUS :   09-29-17: hgb a1c 6.4 11-08-17: Rheumatoid factor 29.6 (elevated) uric acid 3.6 03-01-18: wbc  6.3; hgb 14.3; hct 48.3; mcv 89.0 plt 183; glucose 107; bun 17; creat 1.26; k+ 4.3; na++ 139; ca 9.7 liver normal albumin 3.3   TODAY:   05-01-18: wbc 5.3 hgb 13.9; hct 46.5; mcv 91.2 plt 125 glucose 125; bun 12; creat 0.86; k+ 4.1; na++ 141; ca 9.7 liver normal albumin 3.4 chol 127 ldl 66; trig 108; hdl 39    Review of Systems  Constitutional: Negative for malaise/fatigue.  Respiratory: Negative for cough and shortness of breath.   Cardiovascular: Negative for chest pain, palpitations and leg swelling.  Gastrointestinal: Negative for abdominal pain, constipation and heartburn.  Musculoskeletal: Negative for back pain, joint pain and myalgias.  Skin: Negative.   Neurological: Negative for dizziness.  Psychiatric/Behavioral: The patient is not nervous/anxious.     Physical Exam Constitutional:      General: She is not in acute distress.    Appearance: She is well-developed. She is not diaphoretic.  Neck:     Musculoskeletal: Neck supple.     Thyroid: No thyromegaly.  Cardiovascular:     Rate and Rhythm: Normal rate and regular rhythm.     Pulses: Normal pulses.     Heart sounds: Normal heart sounds.  Pulmonary:     Effort: Pulmonary effort is normal. No respiratory distress.     Breath sounds: Normal breath sounds.  Abdominal:     General: Bowel sounds are normal. There is no distension.     Palpations: Abdomen is soft.     Tenderness: There is no abdominal tenderness.  Musculoskeletal:     Right lower leg: No edema.     Left  lower leg: No edema.     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  Skin:    General: Skin is warm and  dry.     Comments: Bilateral lower extremities discolored   Neurological:     Mental Status: She is alert. Mental status is at baseline.        ASSESSMENT/ PLAN:  TODAY:   1. Type 2 diabetes, controlled with peripheral neuropathy: stable hgb a1c 6.4 will continue tradjenta 5 mg daily   2. Dyslipidemia associated with type 2 diabetes mellitus: is stable ldl 66 will continue crestor 20 mg daily   3. Diabetic peripheral neuropathy associated with type 2 diabetes mellitus: is stable will continue lyrica 75 mg daily and vicodin 5/325 mg daily   4. Atrial fibrillation with RVR: heart rate is stable: will continue eliquis 2.5 mg twice daily; will continue cartia xt 300 mg daily   5. Chronic diastolic heart failure: EF 55-60% (07-29-26): is stable will continue lasix 40 mg daily with k+ 40 meq twice daily   6. Hypokalemia: is stable k+ 4.1; will continue k+ 40 meq twice daily   7. Rheumatoid arteritis: stable; will continue methotrexate 12.5 mg weekly with folic acid 5 mg weekly   8. Unspecified COPD/ chronic respiratory failure with hypoxia: is stable is 02 dependent; will continue trelegy ellipta 100-62.5-25 mcg 1 puff daily; albuterol 2 puffs every 6 hours as needed; and duoneb twice daily as needed  9. GERD without esophagitis: is stable will continue protonix 40 mg twice daily   10. Vascular dementia without behavioral disturbance; is without change; weight is 191 pounds; will continue aricept 5 mg daily   11. CKD stage 3 due to type 2 diabetes mellitus: is stable bun12; creat 0.86  12. Fibromyalgia: is stable will continue lyrica 75 mg daily   13. Post-menopausal osteoporosis: is stable will continue evista 60 mg daily does take supplements   14. Chronic constipation: is stable will continue miralax daily as needed   Will check hgb a1c and urine micro albumin    MD is aware of resident's narcotic use and is in agreement with current plan of care. We will wean dosage as  appropriate for resident   Ok Edwards NP [facility]  Contact [phone] Monday through Friday 8am- 5pm  After hours call [phone]

## 2018-06-01 ENCOUNTER — Encounter (HOSPITAL_COMMUNITY)
Admission: RE | Admit: 2018-06-01 | Discharge: 2018-06-01 | Disposition: A | Discharge status: Home / Self Care | Payer: Medicare Other | Source: Skilled Nursing Facility | Attending: Adult Health | Admitting: Adult Health

## 2018-06-01 DIAGNOSIS — J449 Chronic obstructive pulmonary disease, unspecified: Secondary | ICD-10-CM | POA: Insufficient documentation

## 2018-06-01 DIAGNOSIS — E118 Type 2 diabetes mellitus with unspecified complications: Secondary | ICD-10-CM | POA: Diagnosis not present

## 2018-06-01 DIAGNOSIS — I129 Hypertensive chronic kidney disease with stage 1 through stage 4 chronic kidney disease, or unspecified chronic kidney disease: Secondary | ICD-10-CM | POA: Insufficient documentation

## 2018-06-01 DIAGNOSIS — I4819 Other persistent atrial fibrillation: Secondary | ICD-10-CM | POA: Diagnosis not present

## 2018-06-01 LAB — HEMOGLOBIN A1C
Hgb A1c MFr Bld: 5.9 % — ABNORMAL HIGH (ref 4.8–5.6)
MEAN PLASMA GLUCOSE: 122.63 mg/dL

## 2018-06-02 LAB — MICROALBUMIN / CREATININE URINE RATIO
Creatinine, Urine: 14.3 mg/dL
Microalb Creat Ratio: <21 mg/g creat (ref 0–29)

## 2018-06-08 ENCOUNTER — Encounter (HOSPITAL_COMMUNITY)
Admission: RE | Admit: 2018-06-08 | Discharge: 2018-06-08 | Disposition: A | Discharge status: Home / Self Care | Payer: Medicare Other | Source: Skilled Nursing Facility | Attending: Internal Medicine | Admitting: Internal Medicine

## 2018-06-08 DIAGNOSIS — I130 Hypertensive heart and chronic kidney disease with heart failure and stage 1 through stage 4 chronic kidney disease, or unspecified chronic kidney disease: Secondary | ICD-10-CM | POA: Insufficient documentation

## 2018-06-08 DIAGNOSIS — K5900 Constipation, unspecified: Secondary | ICD-10-CM | POA: Insufficient documentation

## 2018-06-08 DIAGNOSIS — J449 Chronic obstructive pulmonary disease, unspecified: Secondary | ICD-10-CM | POA: Insufficient documentation

## 2018-06-13 ENCOUNTER — Other Ambulatory Visit: Payer: Self-pay | Admitting: Internal Medicine

## 2018-06-13 MED ORDER — HYDROCODONE-ACETAMINOPHEN 5-325 MG PO TABS: 1.0000 | ORAL_TABLET | Freq: Every day | ORAL | 0 refills | Status: DC

## 2018-07-05 ENCOUNTER — Non-Acute Institutional Stay (SKILLED_NURSING_FACILITY): Payer: Medicare Other | Admitting: Internal Medicine

## 2018-07-05 ENCOUNTER — Encounter: Payer: Self-pay | Admitting: Internal Medicine

## 2018-07-05 DIAGNOSIS — I5032 Chronic diastolic (congestive) heart failure: Secondary | ICD-10-CM

## 2018-07-05 DIAGNOSIS — I4891 Unspecified atrial fibrillation: Secondary | ICD-10-CM | POA: Diagnosis not present

## 2018-07-05 DIAGNOSIS — M0520 Rheumatoid vasculitis with rheumatoid arthritis of unspecified site: Secondary | ICD-10-CM

## 2018-07-05 DIAGNOSIS — I10 Essential (primary) hypertension: Secondary | ICD-10-CM | POA: Diagnosis not present

## 2018-07-05 NOTE — Progress Notes (Signed)
Location:  [place] Room Number: [place] of Service:  SNF [phone]) Provider:  Veleta Miners, MD  Celene Squibb, MD  Patient Care Team: Celene Squibb, MD as PCP - General  Extended Emergency Contact Information Primary Emergency Contact: Nance,Sheila Address: [address]          [place], [place] [address] [place] Phone: [phone] Relation: Daughter Secondary Emergency Contact: Charlynne Pander, [place] [address] [place] Phone: [phone] Relation: Granddaughter  Code Status:  DNR Goals of care: Advanced Directive information Advanced Directives 07/05/2018  Does Patient Have a Medical Advance Directive? Yes  Type of Advance Directive Out of facility DNR (pink MOST or yellow form)  Does patient want to make changes to medical advance directive? No - Patient declined  Copy of [place] in Chart? -  Would patient like information on creating a medical advance directive? No - Patient declined  Pre-existing out of facility DNR order (yellow form or pink MOST form) Yellow form placed in chart (order not valid for inpatient use)     Chief Complaint  Patient presents with  . Medical Management of Chronic Issues    A-Fib, Diabetes Mellitus type 2  . Best Practice Recommendations    Due for Dexa Scan     HPI:  Pt is a 85 y.o. female seen today for medical management of chronic diseases.    Patient has h/o COPD on Chronic oxygen, Chronic Atrial Fibrillation on Eliquis, Rheumatoid arthritis, Fibromyalgia, Peripheral Neuropathy, Hyperlipidemia, PUD, and anxiety disorder. Patient has been stable in facility. She doe shave cough but it is mostly dry and chronic for patients  She also has pain in her back and legs which is also Chronic No acute issues. Her weight is stable. BS mostly less then 150     Past Medical History:  Diagnosis Date  . Anemia   . Atrial fibrillation ([place])   .  Back pain   . Bacterial pneumonia   . Cancer (HCC)    Skin   . Chronic respiratory failure ([place])   . Cognitive communication deficit   . COPD (chronic obstructive pulmonary disease) ([place])   . Dementia ([place])   . Dementia ([place])   . Depression   . Diabetes mellitus without complication ([place])   . Diverticulosis   . Diverticulosis   . Fatty liver   . Fibromyalgia   . Fibromyalgia   . Gastric polyp 09/08/11   at the anastomosis-inflammatory  . Generalized anxiety disorder   . Generalized anxiety disorder   . GERD (gastroesophageal reflux disease)   . GERD (gastroesophageal reflux disease)   . 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 Hypercholesteremia   . Hypertension   . Insomnia   . Internal hemorrhoids   . Muscle weakness   . Neuropathy   . On home O2    qhs  . Osteoporosis   . Rheumatoid arteritis ([place])   . Ulcer    stomach  . Vitamin D deficiency    Past Surgical History:  Procedure Laterality Date  . ABDOMINAL HYSTERECTOMY    . ABDOMINAL SURGERY     removed patial stomach from ulcer  . COLONOSCOPY  04-2001   internal hemorrhoids, panocolonic diverticulosis, and colon polyps   . UPPER GASTROINTESTINAL ENDOSCOPY      Allergies  Allergen Reactions  . Other     Band Aids   .  Tape   . Lipitor [Atorvastatin Calcium] Rash and Other (See Comments)    Muscle pain  . Niaspan [Niacin Er] Rash and Other (See Comments)    Muscle pain    Outpatient Encounter Medications as of 07/05/2018  Medication Sig  . acetaminophen (TYLENOL) 325 MG tablet Take 650 mg by mouth every 8 (eight) hours as needed.  Marland Kitchen albuterol (PROAIR HFA) 108 (90 BASE) MCG/ACT inhaler Inhale 2 puffs into the lungs every 6 (six) hours as needed for wheezing or shortness of breath. For rescue   . apixaban (ELIQUIS) 2.5 MG TABS tablet Take 2.5 mg by mouth 2 (two) times daily.  Roseanne Kaufman [place]-Castor Oil (VENELEX) OINT Apply to sacrum and bilateral buttocks q shift and prn for prevention  . CARTIA XT 300 MG  24 hr capsule TAKE ONE CAPSULE BY MOUTH EVERY DAY  . donepezil (ARICEPT) 5 MG tablet Take 5 mg by mouth at bedtime.  . DULoxetine (CYMBALTA) 60 MG capsule Take 60 mg by mouth daily.    . Fluticasone-Umeclidin-Vilant (TRELEGY ELLIPTA) 100-62.5-25 MCG/INH AEPB One puff inhalation once a day   . Folic Acid 5 MG CAPS Take 5 mg by mouth every Sunday.  . furosemide (LASIX) 40 MG tablet Take 40 mg by mouth daily.  Marland Kitchen guaifenesin (ROBITUSSIN) 100 MG/5ML syrup Take 200 mg by mouth 4 (four) times daily as needed for cough.  Marland Kitchen HYDROcodone-acetaminophen (NORCO/VICODIN) 5-325 MG tablet Take 1 tablet by mouth daily.  Marland Kitchen ipratropium-albuterol (DUONEB) 0.5-2.5 (3) MG/3ML SOLN Take 3 mLs by nebulization 2 (two) times daily as needed.  . linagliptin (TRADJENTA) 5 MG TABS tablet Take 1 tablet (5 mg total) by mouth daily.  . Menthol, Topical Analgesic, (BIOFREEZE) 4 % GEL Apply to both knees daily as needed and to left shoulder  . methotrexate (RHEUMATREX) 2.5 MG tablet Take 12.5 mg by mouth once a week. On Sunday - Caution:Chemotherapy. Protect from light.  . Multiple Vitamin (DAILY VITE PO) Take 1 tablet by mouth daily.  . NON FORMULARY Diet Type:  NAS, Consistent Carbohydrates, Dysphagia 3  . OXYGEN Inhale 3.5 L/min into the lungs continuous.  . pantoprazole (PROTONIX) 40 MG tablet Take 40 mg by mouth 2 (two) times daily.  . phenylephrine-shark liver oil-mineral oil-petrolatum (PREPARATION H) 0.25-3-14-71.9 % rectal ointment Place 1 application rectally 2 (two) times daily as needed for hemorrhoids.  . polyethylene glycol (MIRALAX / GLYCOLAX) packet Take 17 g by mouth daily as needed for moderate constipation.  . potassium chloride SA (K-DUR,KLOR-CON) 20 MEQ tablet Take 2 tablets (40 mg ) by mouth twice a day  . pregabalin (LYRICA) 50 MG capsule Take 1 capsule by mouth once daily  . Propylene Glycol (SYSTANE BALANCE) 0.6 % SOLN Place 1 drop into both eyes 2 (two) times daily.  . raloxifene (EVISTA) 60 MG tablet  Take 60 mg by mouth daily.  . rosuvastatin (CRESTOR) 20 MG tablet Take 20 mg by mouth daily.  . simethicone (MYLICON) 80 MG chewable tablet Chew 80 mg by mouth 2 (two) times daily as needed for flatulence.  . Skin Protectants, Misc. (NO-STING SKIN-PREP EX) Apply externally to bilateral heels every shift for prevention   No facility-administered encounter medications on file as of 07/05/2018.     Review of Systems  Constitutional: Negative.   HENT: Negative.   Respiratory: Positive for cough and shortness of breath.   Cardiovascular: Positive for leg swelling.  Gastrointestinal: Positive for abdominal distention.  Genitourinary: Negative.   Musculoskeletal: Positive for arthralgias, neck pain and  neck stiffness.  Skin: Negative.   Neurological: Negative.   Psychiatric/Behavioral: Positive for confusion. The patient is nervous/anxious.   All other systems reviewed and are negative.   Immunization History  Administered Date(s) Administered  . Tdap 10/21/2016   Pertinent  Health Maintenance Due  Topic Date Due  . DEXA SCAN  08/06/2018 (Originally 06/06/1995)  . PNA vac Low Risk Adult (1 of 2 - PCV13) 12/29/2018 (Originally 06/06/1995)  . INFLUENZA VACCINE  10/13/2018  . HEMOGLOBIN A1C  12/02/2018  . OPHTHALMOLOGY EXAM  01/09/2019  . FOOT EXAM  05/31/2019  . URINE MICROALBUMIN  06/01/2019   Fall Risk  08/15/2017  Falls in the past year? No   Functional Status Survey:    Vitals:   07/05/18 0923  Weight: 191 lb 9.6 oz (86.9 kg)  Height: 5\' 3"  (1.6 m)   Body mass index is 33.94 kg/m. Physical Exam Constitutional:      Appearance: She is well-developed.  HENT:     Head: Normocephalic.  Eyes:     Pupils: Pupils are equal, round, and reactive to light.  Neck:     Musculoskeletal: Neck supple.  Cardiovascular:     Rate and Rhythm: Normal rate. Rhythm irregular.     Heart sounds: Murmur present.  Pulmonary:     Effort: Pulmonary effort is normal. No respiratory distress.      Breath sounds: Normal breath sounds. No stridor. No wheezing.  Abdominal:     General: Bowel sounds are normal. There is no distension.     Palpations: Abdomen is soft.     Tenderness: There is no abdominal tenderness. There is no guarding.  Musculoskeletal:     Comments: Mild edema Bilateral   Lymphadenopathy:     Cervical: No cervical adenopathy.  Skin:    General: Skin is warm.  Neurological:     Mental Status: She is alert.     Comments: Not oriented. No Focal deficits. Cannot move her Left UE due to Frozen shoulder She follows most of the Commands. Stays in [employer] .  Psychiatric:        Behavior: Behavior normal.        Thought Content: Thought content normal.     Labs reviewed: Recent Labs    07/11/17 0714  01/17/18 0747 03/01/18 0754 05/01/18 0700  NA  --    < > 140 139 141  K  --    < > 4.1 4.3 4.1  CL  --    < > 102 105 107  CO2  --    < > 30 28 27   GLUCOSE  --    < > 129* 107* 125*  BUN  --    < > 15 17 12   CREATININE  --    < > 1.18* 1.26* 0.86  CALCIUM  --    < > 9.9 9.7 9.7  MG 1.8  --   --   --   --    < > = values in this interval not displayed.   Recent Labs    11/23/17 0731 03/01/18 0754 05/01/18 0700  AST 43* 24 32  ALT 43 25 23  ALKPHOS 87 69 64  BILITOT 0.6 0.8 0.7  PROT 6.3* 6.4* 6.1*  ALBUMIN 3.2* 3.3* 3.4*   Recent Labs    11/19/17 0900 03/01/18 0754 05/01/18 0700  WBC 8.3 6.3 5.3  NEUTROABS 4.0 3.5 2.9  HGB 14.2 14.3 13.9  HCT 45.1 48.3* 46.5*  MCV 86.4 89.0 91.2  PLT 136* 185 125*   Lab Results  Component Value Date   TSH 2.992 07/26/2016   Lab Results  Component Value Date   HGBA1C 5.9 (H) 06/01/2018   Lab Results  Component Value Date   CHOL 127 05/01/2018   HDL 39 (L) 05/01/2018   LDLCALC 66 05/01/2018   TRIG 108 05/01/2018   CHOLHDL 3.3 05/01/2018    Significant Diagnostic Results in last 30 days:  No results found.  Assessment/Plan  Diastolic CHF Doing well  On Low dose of Lasix Weight is  stable Type2 diabetes mellitus  On Tradjenta BS were running Less then150 Her A1C is 5.9 in 03/20 On Lyrica for Neuropathy Will discontinue Tradjenta COPD Continue on Nebs.PRN On Trelegy and Oxygen IAtrial fibrillation Rate controlled on diltiazem On Eliquis Will continue Low dose as patient has h/o Falls and very frail Chronic Left shoulder Pain Xray in hospital showed Arthritis Continues Biofreeze and Norco for Pain  Rheumatoid arthritis Patient restarted on Methotrexate and is now had no Acute flares. Will Continue her on that dose and Follow her Hepatic Panel H/O Aspiration Pneumonia She is on Dysphagia Diet. Hyperlipidemia On Crestor Dementia Patient on Aricept. Stays Full dependent for her ADL Depression On Cymbalta GERD On Protonix Osteoporosis On Raloxifene No reason to repeat DEXA at this time . Will consider when Covid restrictions done   Family/ staff Communication:   Labs/tests ordered:   Total time spent in this patient care encounter was  25_  minutes; greater than 50% of the visit spent counseling patient and staff, reviewing records , Labs and coordinating care for problems addressed at this encounter.

## 2018-07-12 ENCOUNTER — Other Ambulatory Visit: Payer: Self-pay | Admitting: Adult Health

## 2018-07-12 MED ORDER — HYDROCODONE-ACETAMINOPHEN 5-325 MG PO TABS: 1.0000 | ORAL_TABLET | Freq: Every day | ORAL | 0 refills | Status: DC

## 2018-08-07 ENCOUNTER — Encounter: Payer: Self-pay | Admitting: Adult Health

## 2018-08-07 ENCOUNTER — Non-Acute Institutional Stay (SKILLED_NURSING_FACILITY): Payer: Medicare Other | Admitting: Adult Health

## 2018-08-07 DIAGNOSIS — E785 Hyperlipidemia, unspecified: Secondary | ICD-10-CM

## 2018-08-07 DIAGNOSIS — E1142 Type 2 diabetes mellitus with diabetic polyneuropathy: Secondary | ICD-10-CM | POA: Diagnosis not present

## 2018-08-07 DIAGNOSIS — E1169 Type 2 diabetes mellitus with other specified complication: Secondary | ICD-10-CM

## 2018-08-07 NOTE — Progress Notes (Signed)
Location:   [place] Room Number: [place] of Service:  SNF (31)   CODE STATUS: DNR  Allergies  Allergen Reactions  . Other     Band Aids   . Tape   . Lipitor [Atorvastatin Calcium] Rash and Other (See Comments)    Muscle pain  . Niaspan [Niacin Er] Rash and Other (See Comments)    Muscle pain    Chief Complaint  Patient presents with  . Medical Management of Chronic Issues    Type 2 diabetes controlled with peripheral neuropathy; dyslipidemia associated with type 2 diabetes mellitus; diabetic peripheral neuropathy associated with type 2 diabetes mellitus.     HPI:  She is a 85 year old long term resident of this facility being seen for the management of her chronic illnesses: diabetes; dyslipidemia; neuropathy. She denies any uncontrolled pain; her appetite is good; is sleeping well at night. There are no reports of fevers present.   Past Medical History:  Diagnosis Date  . Anemia   . Atrial fibrillation ([place])   . Back pain   . Bacterial pneumonia   . Cancer (HCC)    Skin   . Chronic respiratory failure ([place])   . Cognitive communication deficit   . COPD (chronic obstructive pulmonary disease) ([place])   . Dementia ([place])   . Dementia ([place])   . Depression   . Diabetes mellitus without complication ([place])   . Diverticulosis   . Diverticulosis   . Fatty liver   . Fibromyalgia   . Fibromyalgia   . Gastric polyp 09/08/11   at the anastomosis-inflammatory  . Generalized anxiety disorder   . Generalized anxiety disorder   . GERD (gastroesophageal reflux disease)   . GERD (gastroesophageal reflux disease)   . 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 Hypercholesteremia   . Hypertension   . Insomnia   . Internal hemorrhoids   . Muscle weakness   . Neuropathy   . On home O2    qhs  . Osteoporosis   . Rheumatoid arteritis ([place])   . Ulcer    stomach  . Vitamin D deficiency     Past Surgical History:  Procedure Laterality Date   . ABDOMINAL HYSTERECTOMY    . ABDOMINAL SURGERY     removed patial stomach from ulcer  . COLONOSCOPY  04-2001   internal hemorrhoids, panocolonic diverticulosis, and colon polyps   . UPPER GASTROINTESTINAL ENDOSCOPY      Social History   Socioeconomic History  . Marital status: Widowed    Spouse name: Not on file  . Number of children: 6  . Years of education: Not on file  . Highest education level: Not on file  Occupational History  . Occupation: Retired  Scientific laboratory technician  . Financial resource strain: Not hard at all  . Food insecurity:    Worry: Never true    Inability: Never true  . Transportation needs:    Medical: No    Non-medical: No  Tobacco Use  . Smoking status: Never Smoker  . Smokeless tobacco: Never Used  Substance and Sexual Activity  . Alcohol use: No  . Drug use: No  . Sexual activity: Not on file  Lifestyle  . Physical activity:    Days per week: 0 days    Minutes per session: 0 min  . Stress: Not at all  Relationships  . Social connections:    Talks on phone: Three times a week    Gets together: Three  times a week    Attends religious service: Never    Active member of club or organization: No    Attends meetings of clubs or organizations: Never    Relationship status: Widowed  . Intimate partner violence:    Fear of current or ex partner: No    Emotionally abused: No    Physically abused: No    Forced sexual activity: No  Other Topics Concern  . Not on file  Social History Narrative   Daily caffeine    Family History  Problem Relation Age of Onset  . Heart disease Father   . Kidney disease Mother        kidney cancer  . Colon cancer Daughter 93      VITAL SIGNS BP 122/61   Pulse 67   Temp 98 F (36.7 C)   Resp 18   Ht 5\' 3"  (1.6 m)   Wt 193 lb 3.2 oz (87.6 kg)   SpO2 98%   BMI 34.22 kg/m   Outpatient Encounter Medications as of 08/07/2018  Medication Sig  . acetaminophen (TYLENOL) 325 MG tablet Take 650 mg by mouth every 8  (eight) hours as needed.  Marland Kitchen albuterol (PROAIR HFA) 108 (90 BASE) MCG/ACT inhaler Inhale 2 puffs into the lungs every 6 (six) hours as needed for wheezing or shortness of breath. For rescue   . apixaban (ELIQUIS) 2.5 MG TABS tablet Take 2.5 mg by mouth 2 (two) times daily.  Roseanne Kaufman [place]-Castor Oil (VENELEX) OINT Apply to sacrum and bilateral buttocks q shift and prn for prevention  . CARTIA XT 300 MG 24 hr capsule TAKE ONE CAPSULE BY MOUTH EVERY DAY  . donepezil (ARICEPT) 5 MG tablet Take 5 mg by mouth at bedtime.  . DULoxetine (CYMBALTA) 60 MG capsule Take 60 mg by mouth daily.    . Fluticasone-Umeclidin-Vilant (TRELEGY ELLIPTA) 100-62.5-25 MCG/INH AEPB One puff inhalation once a day   . Folic Acid 5 MG CAPS Take 5 mg by mouth every Sunday.  . furosemide (LASIX) 40 MG tablet Take 40 mg by mouth daily.  Marland Kitchen guaifenesin (ROBITUSSIN) 100 MG/5ML syrup Take 200 mg by mouth 4 (four) times daily as needed for cough.  Marland Kitchen HYDROcodone-acetaminophen (NORCO/VICODIN) 5-325 MG tablet Take 1 tablet by mouth daily.  Marland Kitchen ipratropium-albuterol (DUONEB) 0.5-2.5 (3) MG/3ML SOLN Take 3 mLs by nebulization 2 (two) times daily as needed.  . Menthol, Topical Analgesic, (BIOFREEZE) 4 % GEL Apply to both knees daily as needed and to left shoulder  . methotrexate (RHEUMATREX) 2.5 MG tablet Take 12.5 mg by mouth once a week. On Sunday - Caution:Chemotherapy. Protect from light.  . Multiple Vitamin (DAILY VITE PO) Take 1 tablet by mouth daily.  . NON FORMULARY Diet Type:  NAS, Consistent Carbohydrates, Dysphagia 3  . OXYGEN Inhale 3.5 L/min into the lungs continuous.  . pantoprazole (PROTONIX) 40 MG tablet Take 40 mg by mouth 2 (two) times daily.  . phenylephrine-shark liver oil-mineral oil-petrolatum (PREPARATION H) 0.25-3-14-71.9 % rectal ointment Place 1 application rectally 2 (two) times daily as needed for hemorrhoids.  . polyethylene glycol (MIRALAX / GLYCOLAX) packet Take 17 g by mouth daily as needed for moderate  constipation.  . potassium chloride SA (K-DUR,KLOR-CON) 20 MEQ tablet Take 2 tablets (40 mg ) by mouth twice a day  . pregabalin (LYRICA) 50 MG capsule Take 1 capsule by mouth once daily  . Propylene Glycol (SYSTANE BALANCE) 0.6 % SOLN Place 1 drop into both eyes 2 (two) times daily.  Marland Kitchen  raloxifene (EVISTA) 60 MG tablet Take 60 mg by mouth daily.  . rosuvastatin (CRESTOR) 20 MG tablet Take 20 mg by mouth daily.  . simethicone (MYLICON) 80 MG chewable tablet Chew 80 mg by mouth 2 (two) times daily as needed for flatulence.  . Skin Protectants, Misc. (NO-STING SKIN-PREP EX) Apply externally to bilateral heels every shift for prevention  . [DISCONTINUED] linagliptin (TRADJENTA) 5 MG TABS tablet Take 1 tablet (5 mg total) by mouth daily. (Patient not taking: Reported on 08/07/2018)   No facility-administered encounter medications on file as of 08/07/2018.      SIGNIFICANT DIAGNOSTIC EXAMS  LABS REVIEWED PREVIOUS :   09-29-17: hgb a1c 6.4 11-08-17: Rheumatoid factor 29.6 (elevated) uric acid 3.6 03-01-18: wbc  6.3; hgb 14.3; hct 48.3; mcv 89.0 plt 183; glucose 107; bun 17; creat 1.26; k+ 4.3; na++ 139; ca 9.7 liver normal albumin 3.3  05-01-18: wbc 5.3 hgb 13.9; hct 46.5; mcv 91.2 plt 125 glucose 125; bun 12; creat 0.86; k+ 4.1; na++ 141; ca 9.7 liver normal albumin 3.4 chol 127 ldl 66; trig 108; hdl 39   TODAY:   06-01-18: hgb a1c 5.9; urine micro-albumin <3.0   Review of Systems  Constitutional: Negative for malaise/fatigue.  Respiratory: Negative for cough and shortness of breath.   Cardiovascular: Negative for chest pain, palpitations and leg swelling.  Gastrointestinal: Negative for abdominal pain, constipation and heartburn.  Musculoskeletal: Negative for back pain, joint pain and myalgias.  Skin: Negative.   Neurological: Negative for dizziness.  Psychiatric/Behavioral: The patient is not nervous/anxious.      Physical Exam Constitutional:      General: She is not in acute  distress.    Appearance: She is well-developed. She is not diaphoretic.  Neck:     Thyroid: No thyromegaly.  Cardiovascular:     Rate and Rhythm: Normal rate and regular rhythm.     Heart sounds: Normal heart sounds.  Pulmonary:     Effort: Pulmonary effort is normal. No respiratory distress.     Breath sounds: Normal breath sounds.  Abdominal:     General: Bowel sounds are normal. There is no distension.     Palpations: Abdomen is soft.     Tenderness: There is no abdominal tenderness.  Musculoskeletal:     Right lower leg: No edema.     Left lower leg: No edema.     Comments: Unable to move left upper extremity due to frozen left shoulder Able to move other extremities     Lymphadenopathy:     Cervical: No cervical adenopathy.  Skin:    General: Skin is warm and dry.     Comments:  Bilateral lower extremities discolored    Neurological:     Mental Status: She is alert. Mental status is at baseline.  Psychiatric:        Mood and Affect: Mood normal.        ASSESSMENT/ PLAN:  TODAY:   1. Type 2 diabetes controlled with peripheral neuropathy: stable hgb a1c 6.4 is off tradjenta will monitor   2. Dyslipidemia associated with type 2 diabetes mellitus: is stable ldl 66 will continue crestor 20 mg daily   3. Diabetic peripheral neuropathy associated with type 2 diabetes mellitus; is stable will continue vicodin 5/325 mg daily and lyrica 50 mg daily   PREVIOUS   4. Atrial fibrillation with RVR: heart rate is stable: will continue eliquis 2.5 mg twice daily; will continue cartia xt 300 mg daily for rate control  5. Chronic diastolic heart failure: EF 55-60% (07-29-26): is stable will continue lasix 40 mg daily with k+ 40 meq twice daily   6. Hypokalemia: is stable k+ 4.1; will continue k+ 40 meq twice daily   7. Rheumatoid arteritis: stable; will continue methotrexate 12.5 mg weekly with folic acid 5 mg weekly   8. Unspecified COPD/ chronic respiratory failure with  hypoxia: is stable is 02 dependent; will continue trelegy ellipta 100-62.5-25 mcg 1 puff daily; albuterol 2 puffs every 6 hours as needed; and duoneb twice daily as needed  9. GERD without esophagitis: is stable will continue protonix 40 mg twice daily   10. Vascular dementia without behavioral disturbance; is without change; weight is 193 (previous 191) pounds; will continue aricept 5 mg daily   11. CKD stage 3 due to type 2 diabetes mellitus: is stable bun12; creat 0.86  12. Fibromyalgia: is stable will continue lyrica 50 mg daily   13. Post-menopausal osteoporosis: is stable will continue evista 60 mg daily does take supplements   14. Chronic constipation: is stable will continue miralax daily as needed      MD is aware of resident's narcotic use and is in agreement with current plan of care. We will attempt to wean resident as apropriate   Ok Edwards NP [facility]  Contact [phone] Monday through Friday 8am- 5pm  After hours call [phone]

## 2018-08-09 ENCOUNTER — Other Ambulatory Visit: Payer: Self-pay | Admitting: Adult Health

## 2018-08-09 MED ORDER — HYDROCODONE-ACETAMINOPHEN 5-325 MG PO TABS: 1.0000 | ORAL_TABLET | Freq: Every day | ORAL | 0 refills | Status: DC

## 2018-08-12 IMAGING — CR DG CHEST 1V PORT
1 series · 1 of 1 positions shown · non-contrast
Comparison: 04/10/2017

CLINICAL DATA: Cough

EXAM:
PORTABLE CHEST 1 VIEW

[portable]
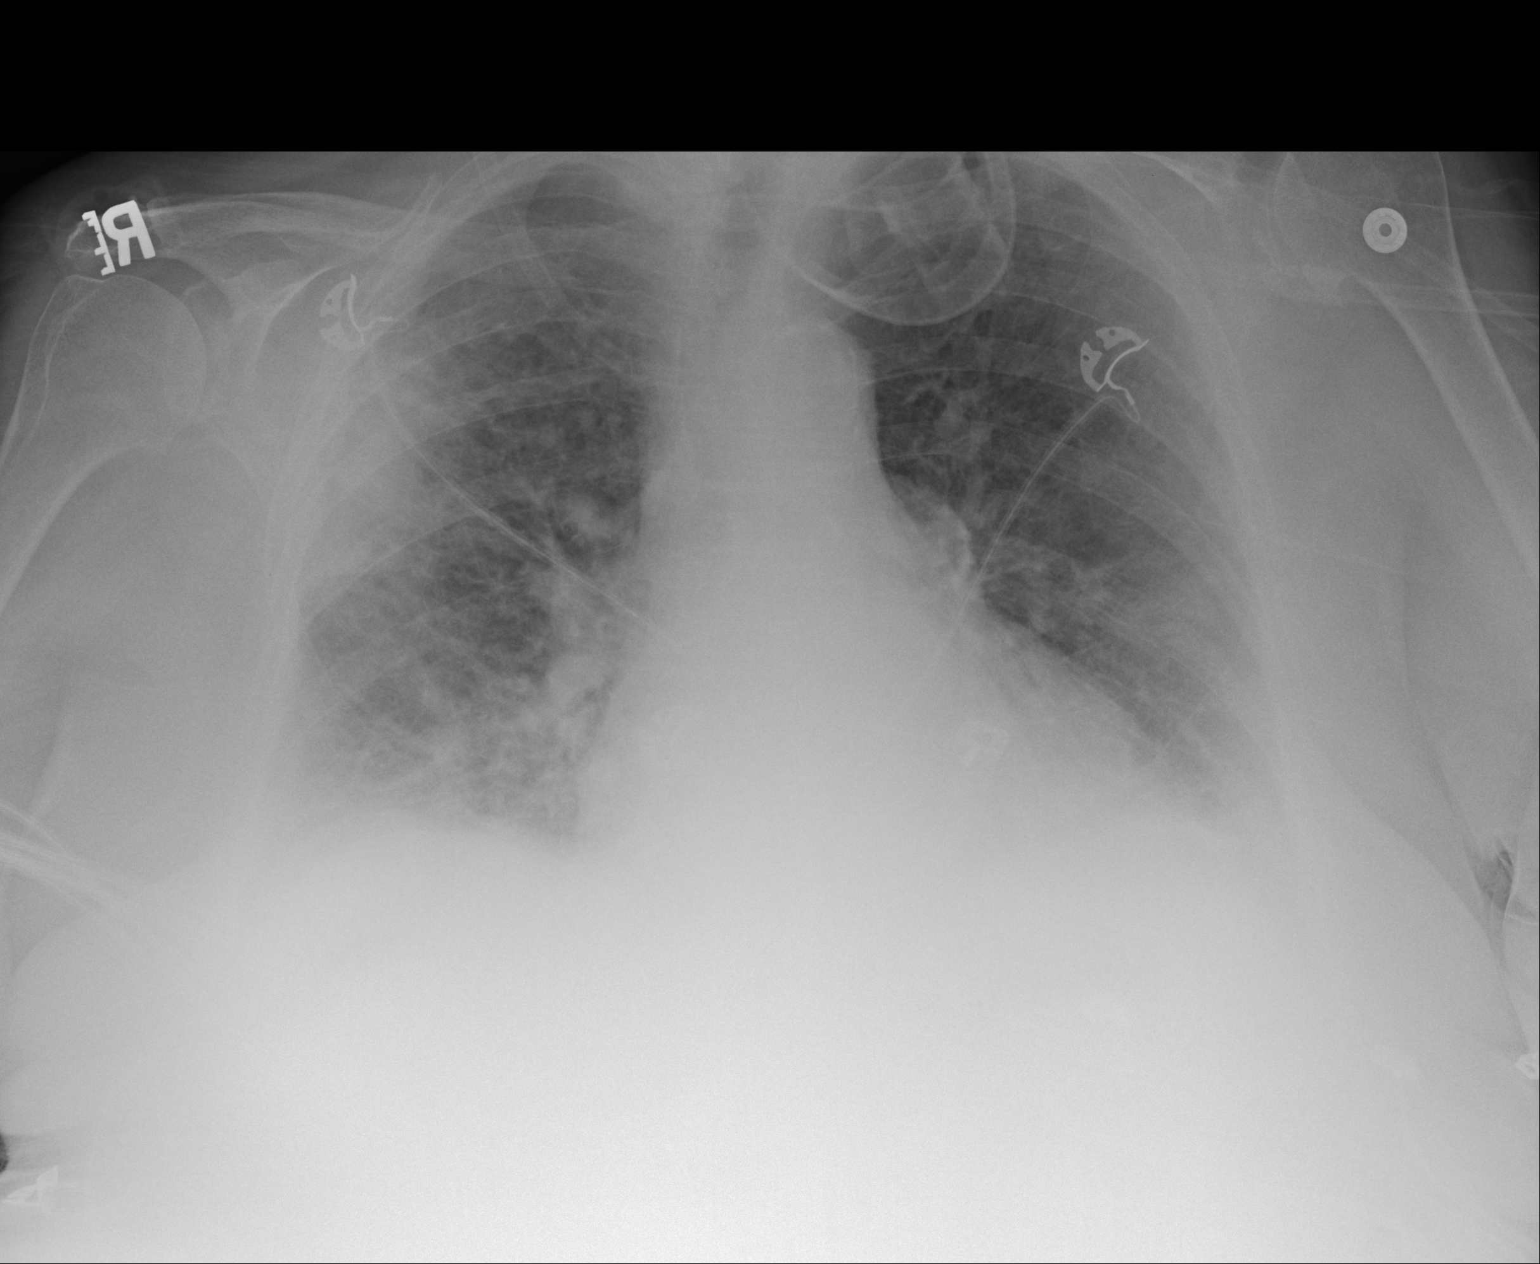

[1 of 1 positions shown; findings below may reference images not displayed]

FINDINGS: There is persistent right upper lobe airspace disease most
concerning for pneumonia. There is diffuse bilateral interstitial
thickening. There is no significant pleural effusion. There is no
pneumothorax. There is stable cardiomegaly.

The osseous structures are unremarkable.
IMPRESSION: 1. Persistent right upper lobe pneumonia.
2. Bilateral mild interstitial thickening primarily at the lung
bases which may reflect multi lobar pneumonia versus mild CHF.

## 2018-08-23 DIAGNOSIS — Z1159 Encounter for screening for other viral diseases: Secondary | ICD-10-CM | POA: Diagnosis not present

## 2018-08-23 DIAGNOSIS — J449 Chronic obstructive pulmonary disease, unspecified: Secondary | ICD-10-CM | POA: Diagnosis not present

## 2018-08-23 DIAGNOSIS — I129 Hypertensive chronic kidney disease with stage 1 through stage 4 chronic kidney disease, or unspecified chronic kidney disease: Secondary | ICD-10-CM | POA: Diagnosis not present

## 2018-08-31 ENCOUNTER — Encounter: Payer: Self-pay | Admitting: Adult Health

## 2018-08-31 ENCOUNTER — Non-Acute Institutional Stay (SKILLED_NURSING_FACILITY): Payer: Medicare Other | Admitting: Adult Health

## 2018-08-31 DIAGNOSIS — E876 Hypokalemia: Secondary | ICD-10-CM | POA: Diagnosis not present

## 2018-08-31 DIAGNOSIS — I5032 Chronic diastolic (congestive) heart failure: Secondary | ICD-10-CM

## 2018-08-31 DIAGNOSIS — I4891 Unspecified atrial fibrillation: Secondary | ICD-10-CM

## 2018-08-31 NOTE — Progress Notes (Signed)
Location:   [place] Room Number: [place] of Service:  SNF (31)   CODE STATUS: DNR  Allergies  Allergen Reactions  . Other     Band Aids   . Tape   . Lipitor [Atorvastatin Calcium] Rash and Other (See Comments)    Muscle pain  . Niaspan [Niacin Er] Rash and Other (See Comments)    Muscle pain    Chief Complaint  Patient presents with  . Medical Management of Chronic Issues      Atrial fibrillation with RVR Chronic diastolic heart failure:Hypokalemia    HPI:  She is a 85 year old long term resident of this facility being seen for the management of her chronic illnesses; afib; chf; hypokalemia. She denies any chest pain or palpitations. There are no reports of uncontrolled pain. There are no reports of fevers present.   Past Medical History:  Diagnosis Date  . Anemia   . Atrial fibrillation ([place])   . Back pain   . Bacterial pneumonia   . Cancer (HCC)    Skin   . Chronic respiratory failure ([place])   . Cognitive communication deficit   . COPD (chronic obstructive pulmonary disease) ([place])   . Dementia ([place])   . Dementia ([place])   . Depression   . Diabetes mellitus without complication ([place])   . Diverticulosis   . Diverticulosis   . Fatty liver   . Fibromyalgia   . Fibromyalgia   . Gastric polyp 09/08/11   at the anastomosis-inflammatory  . Generalized anxiety disorder   . Generalized anxiety disorder   . GERD (gastroesophageal reflux disease)   . GERD (gastroesophageal reflux disease)   . 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 Hypercholesteremia   . Hypertension   . Insomnia   . Internal hemorrhoids   . Muscle weakness   . Neuropathy   . On home O2    qhs  . Osteoporosis   . Rheumatoid arteritis ([place])   . Ulcer    stomach  . Vitamin D deficiency     Past Surgical History:  Procedure Laterality Date  . ABDOMINAL HYSTERECTOMY    . ABDOMINAL SURGERY     removed patial stomach from ulcer  . COLONOSCOPY  04-2001   internal hemorrhoids, panocolonic diverticulosis, and colon polyps   . UPPER GASTROINTESTINAL ENDOSCOPY      Social History   Socioeconomic History  . Marital status: Widowed    Spouse name: Not on file  . Number of children: 6  . Years of education: Not on file  . Highest education level: Not on file  Occupational History  . Occupation: Retired  Scientific laboratory technician  . Financial resource strain: Not hard at all  . Food insecurity    Worry: Never true    Inability: Never true  . Transportation needs    Medical: No    Non-medical: No  Tobacco Use  . Smoking status: Never Smoker  . Smokeless tobacco: Never Used  Substance and Sexual Activity  . Alcohol use: No  . Drug use: No  . Sexual activity: Not on file  Lifestyle  . Physical activity    Days per week: 0 days    Minutes per session: 0 min  . Stress: Not at all  Relationships  . Social Herbalist on phone: Three times a week    Gets together: Three times a week    Attends religious service: Never    Active member  of club or organization: No    Attends meetings of clubs or organizations: Never    Relationship status: Widowed  . Intimate partner violence    Fear of current or ex partner: No    Emotionally abused: No    Physically abused: No    Forced sexual activity: No  Other Topics Concern  . Not on file  Social History Narrative   Daily caffeine    Family History  Problem Relation Age of Onset  . Heart disease Father   . Kidney disease Mother        kidney cancer  . Colon cancer Daughter 78      VITAL SIGNS BP (!) 137/56   Pulse 70   Temp 97.6 F (36.4 C)   Resp (!) 22   Ht 5\' 3"  (1.6 m)   Wt 193 lb 3.2 oz (87.6 kg)   SpO2 92%   BMI 34.22 kg/m   Outpatient Encounter Medications as of 08/31/2018  Medication Sig  . acetaminophen (TYLENOL) 325 MG tablet Take 650 mg by mouth every 8 (eight) hours as needed.  Marland Kitchen albuterol (PROAIR HFA) 108 (90 BASE) MCG/ACT inhaler Inhale 2 puffs into the lungs  every 6 (six) hours as needed for wheezing or shortness of breath. For rescue   . apixaban (ELIQUIS) 2.5 MG TABS tablet Take 2.5 mg by mouth 2 (two) times daily.  Roseanne Kaufman [place]-Castor Oil (VENELEX) OINT Apply to sacrum and bilateral buttocks q shift and prn for prevention  . CARTIA XT 300 MG 24 hr capsule TAKE ONE CAPSULE BY MOUTH EVERY DAY  . donepezil (ARICEPT) 5 MG tablet Take 5 mg by mouth at bedtime.  . DULoxetine (CYMBALTA) 60 MG capsule Take 60 mg by mouth daily.    . Fluticasone-Umeclidin-Vilant (TRELEGY ELLIPTA) 100-62.5-25 MCG/INH AEPB One puff inhalation once a day   . Folic Acid 5 MG CAPS Take 5 mg by mouth every Sunday.  . furosemide (LASIX) 40 MG tablet Take 40 mg by mouth daily.  Marland Kitchen guaifenesin (ROBITUSSIN) 100 MG/5ML syrup Take 200 mg by mouth 4 (four) times daily as needed for cough.  Marland Kitchen HYDROcodone-acetaminophen (NORCO/VICODIN) 5-325 MG tablet Take 1 tablet by mouth daily.  Marland Kitchen ipratropium-albuterol (DUONEB) 0.5-2.5 (3) MG/3ML SOLN Take 3 mLs by nebulization 2 (two) times daily as needed.  . Menthol, Topical Analgesic, (BIOFREEZE) 4 % GEL Apply to both knees daily as needed and to left shoulder  . methotrexate (RHEUMATREX) 2.5 MG tablet Take 12.5 mg by mouth once a week. On Sunday - Caution:Chemotherapy. Protect from light.  . Multiple Vitamin (DAILY VITE PO) Take 1 tablet by mouth daily.  . NON FORMULARY Diet Type:  NAS, Consistent Carbohydrates, Dysphagia 3  . OXYGEN Inhale 3.5 L/min into the lungs continuous.  . pantoprazole (PROTONIX) 40 MG tablet Take 40 mg by mouth 2 (two) times daily.  . phenylephrine-shark liver oil-mineral oil-petrolatum (PREPARATION H) 0.25-3-14-71.9 % rectal ointment Place 1 application rectally 2 (two) times daily as needed for hemorrhoids.  . polyethylene glycol (MIRALAX / GLYCOLAX) packet Take 17 g by mouth daily as needed for moderate constipation.  . potassium chloride SA (K-DUR,KLOR-CON) 20 MEQ tablet Take 2 tablets (40 mg ) by mouth twice a day  .  pregabalin (LYRICA) 50 MG capsule Take 1 capsule by mouth once daily  . Propylene Glycol (SYSTANE BALANCE) 0.6 % SOLN Place 1 drop into both eyes 2 (two) times daily.  . raloxifene (EVISTA) 60 MG tablet Take 60 mg by mouth daily.  Marland Kitchen  rosuvastatin (CRESTOR) 20 MG tablet Take 20 mg by mouth daily.  . simethicone (MYLICON) 80 MG chewable tablet Chew 80 mg by mouth 2 (two) times daily as needed for flatulence.  . Skin Protectants, Misc. (NO-STING SKIN-PREP EX) Apply externally to bilateral heels every shift for prevention   No facility-administered encounter medications on file as of 08/31/2018.      SIGNIFICANT DIAGNOSTIC EXAMS  LABS REVIEWED PREVIOUS :   09-29-17: hgb a1c 6.4 11-08-17: Rheumatoid factor 29.6 (elevated) uric acid 3.6 03-01-18: wbc  6.3; hgb 14.3; hct 48.3; mcv 89.0 plt 183; glucose 107; bun 17; creat 1.26; k+ 4.3; na++ 139; ca 9.7 liver normal albumin 3.3  05-01-18: wbc 5.3 hgb 13.9; hct 46.5; mcv 91.2 plt 125 glucose 125; bun 12; creat 0.86; k+ 4.1; na++ 141; ca 9.7 liver normal albumin 3.4 chol 127 ldl 66; trig 108; hdl 39  06-01-18: hgb a1c 5.9; urine micro-albumin <3.0  NO NEW LABS.    Review of Systems  Constitutional: Negative for malaise/fatigue.  Respiratory: Negative for cough and shortness of breath.   Cardiovascular: Negative for chest pain, palpitations and leg swelling.  Gastrointestinal: Negative for abdominal pain, constipation and heartburn.  Musculoskeletal: Negative for back pain, joint pain and myalgias.  Skin: Negative.   Neurological: Negative for dizziness.  Psychiatric/Behavioral: The patient is not nervous/anxious.      Physical Exam Constitutional:      General: She is not in acute distress.    Appearance: She is well-developed. She is not diaphoretic.  Neck:     Musculoskeletal: Neck supple.     Thyroid: No thyromegaly.  Cardiovascular:     Rate and Rhythm: Normal rate and regular rhythm.     Pulses: Normal pulses.     Heart sounds:  Normal heart sounds.  Pulmonary:     Effort: Pulmonary effort is normal. No respiratory distress.     Breath sounds: Normal breath sounds.     Comments: 02 dependent  Abdominal:     General: Bowel sounds are normal. There is no distension.     Palpations: Abdomen is soft.     Tenderness: There is no abdominal tenderness.  Musculoskeletal:     Right lower leg: No edema.     Left lower leg: No edema.     Comments:  Unable to move left upper extremity due to frozen left shoulder Able to move other extremities      Lymphadenopathy:     Cervical: No cervical adenopathy.  Skin:    General: Skin is warm and dry.     Comments: Bilateral lower extremities discolored   Neurological:     Mental Status: She is alert. Mental status is at baseline.  Psychiatric:        Mood and Affect: Mood normal.      ASSESSMENT/ PLAN:  TODAY:   1. Atrial fibrillation with RVR:  Heart rate stable will continue cartia xt 300 mg daily for rate control an eliquis 2.5 mg twice daily   2. Chronic diastolic heart failure: EF 55-60% (07-28-16): is stable will continue lasix 40 mg daily with k+ 40 meq twice daily   3. Hypokalemia: is stable k+ 4.1 will continue k+ 40 meq twice daily    PREVIOUS   4. Rheumatoid arteritis: stable; will continue methotrexate 12.5 mg weekly with folic acid 5 mg weekly   5. Unspecified COPD/ chronic respiratory failure with hypoxia: is stable is 02 dependent; will continue trelegy ellipta 100-62.5-25 mcg 1 puff daily; albuterol 2  puffs every 6 hours as needed; and duoneb twice daily as needed  6. GERD without esophagitis: is stable will continue protonix 40 mg twice daily   7. Vascular dementia without behavioral disturbance; is without change; weight is 193 (previous 191) pounds; will continue aricept 5 mg daily   8. CKD stage 3 due to type 2 diabetes mellitus: is stable bun12; creat 0.86  9. Fibromyalgia: is stable will continue lyrica 50 mg daily   10. Post-menopausal  osteoporosis: is stable will continue evista 60 mg daily does take supplements   11. Chronic constipation: is stable will continue miralax daily as needed   12. Type 2 diabetes controlled with peripheral neuropathy: stable hgb a1c 5.9 is off tradjenta will monitor   13. Dyslipidemia associated with type 2 diabetes mellitus: is stable ldl 66 will continue crestor 20 mg daily   14. Diabetic peripheral neuropathy associated with type 2 diabetes mellitus; is stable will continue vicodin 5/325 mg daily and lyrica 50 mg daily    MD is aware of resident's narcotic use and is in agreement with current plan of care. We will attempt to wean resident as apropriate   Ok Edwards NP [facility]  Contact [phone] Monday through Friday 8am- 5pm  After hours call [phone]

## 2018-09-03 ENCOUNTER — Other Ambulatory Visit: Payer: Self-pay | Admitting: Adult Health

## 2018-09-03 MED ORDER — PREGABALIN 50 MG PO CAPS: ORAL_CAPSULE | ORAL | 0 refills | Status: DC

## 2018-09-10 IMAGING — DX DG CHEST 2V
2 series · 2 of 2 positions shown · non-contrast
Comparison: 05/03/2017.  04/10/2017.

CLINICAL DATA: Bilateral pneumonia.  Shortness of breath.

EXAM:
CHEST - 2 VIEW

[chest lat]
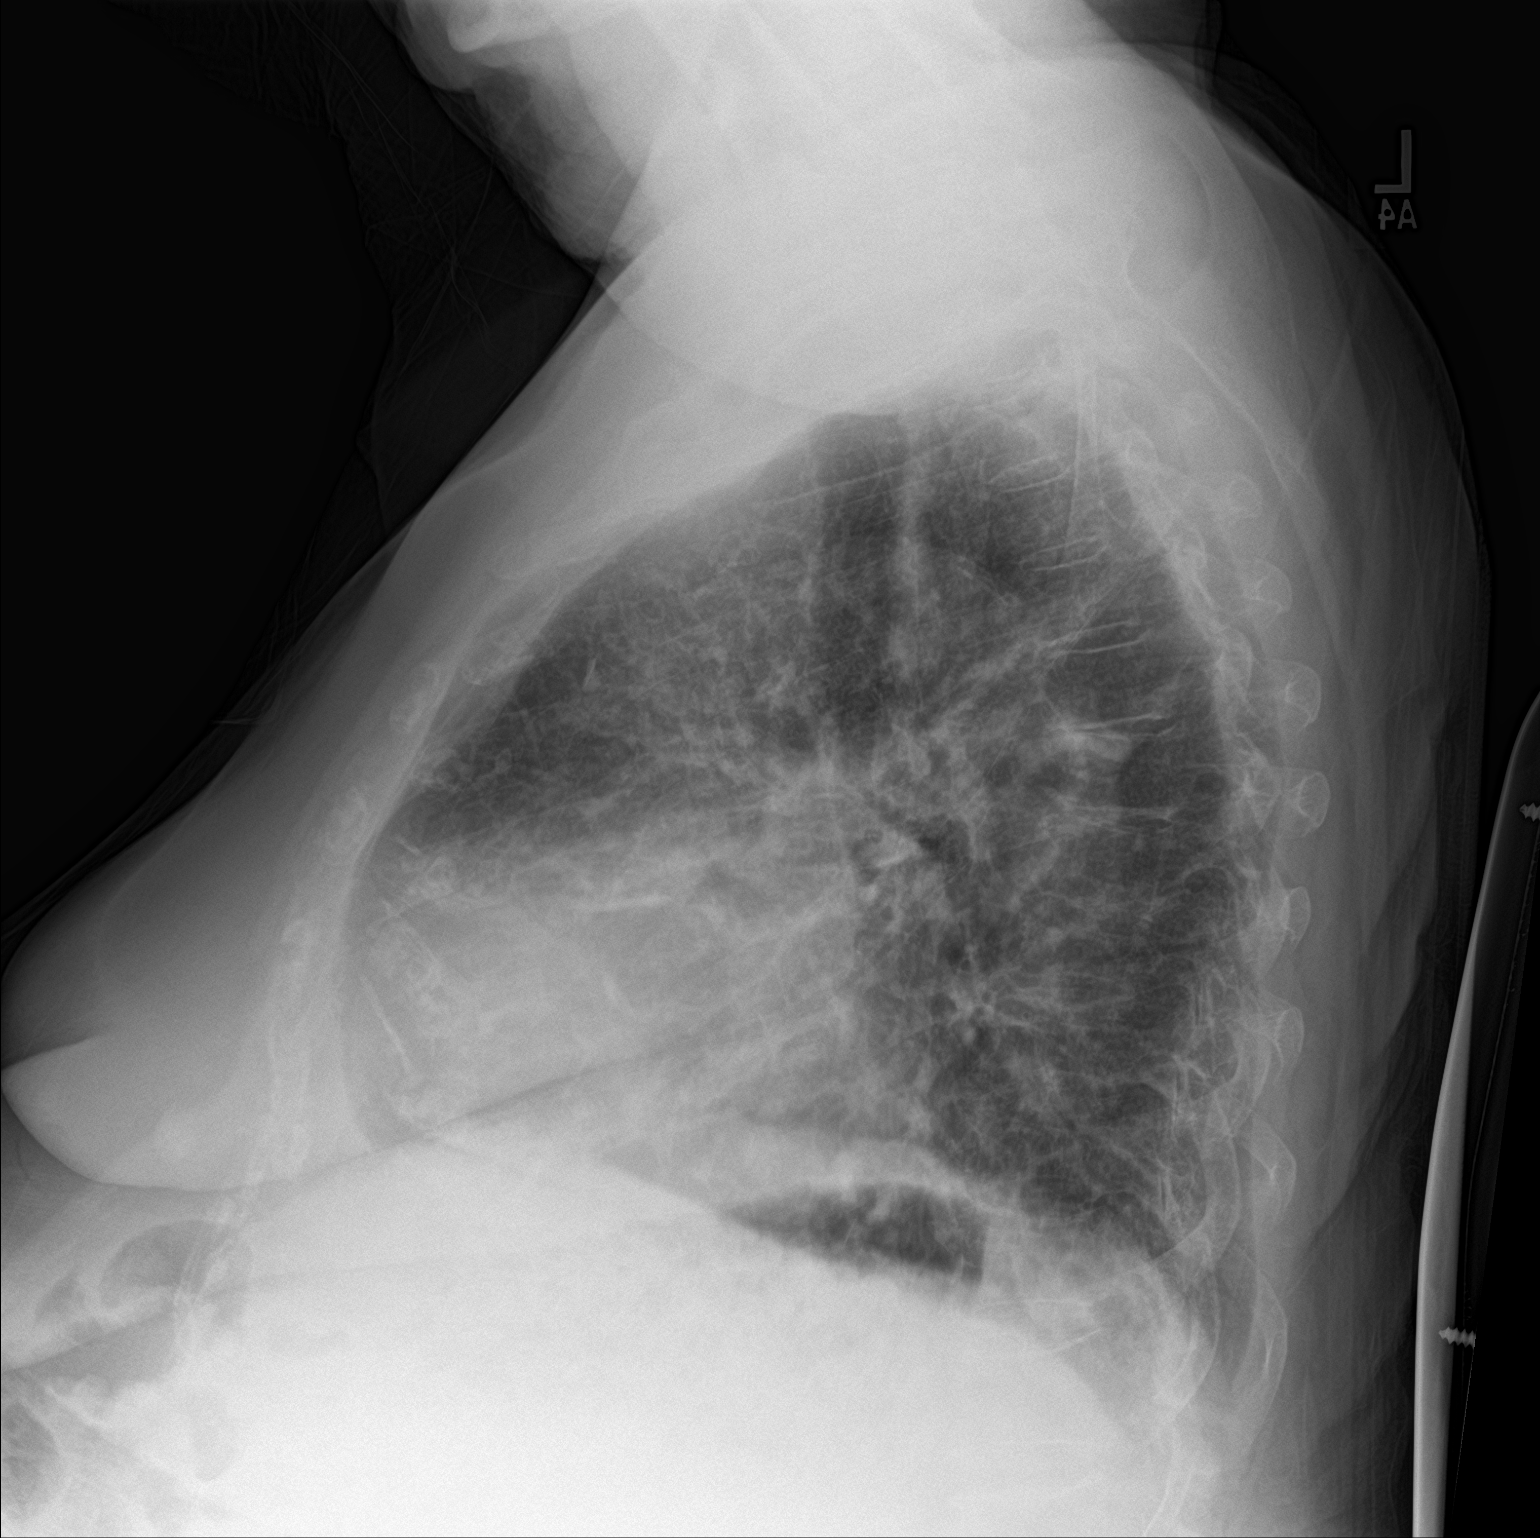

[chest ap]
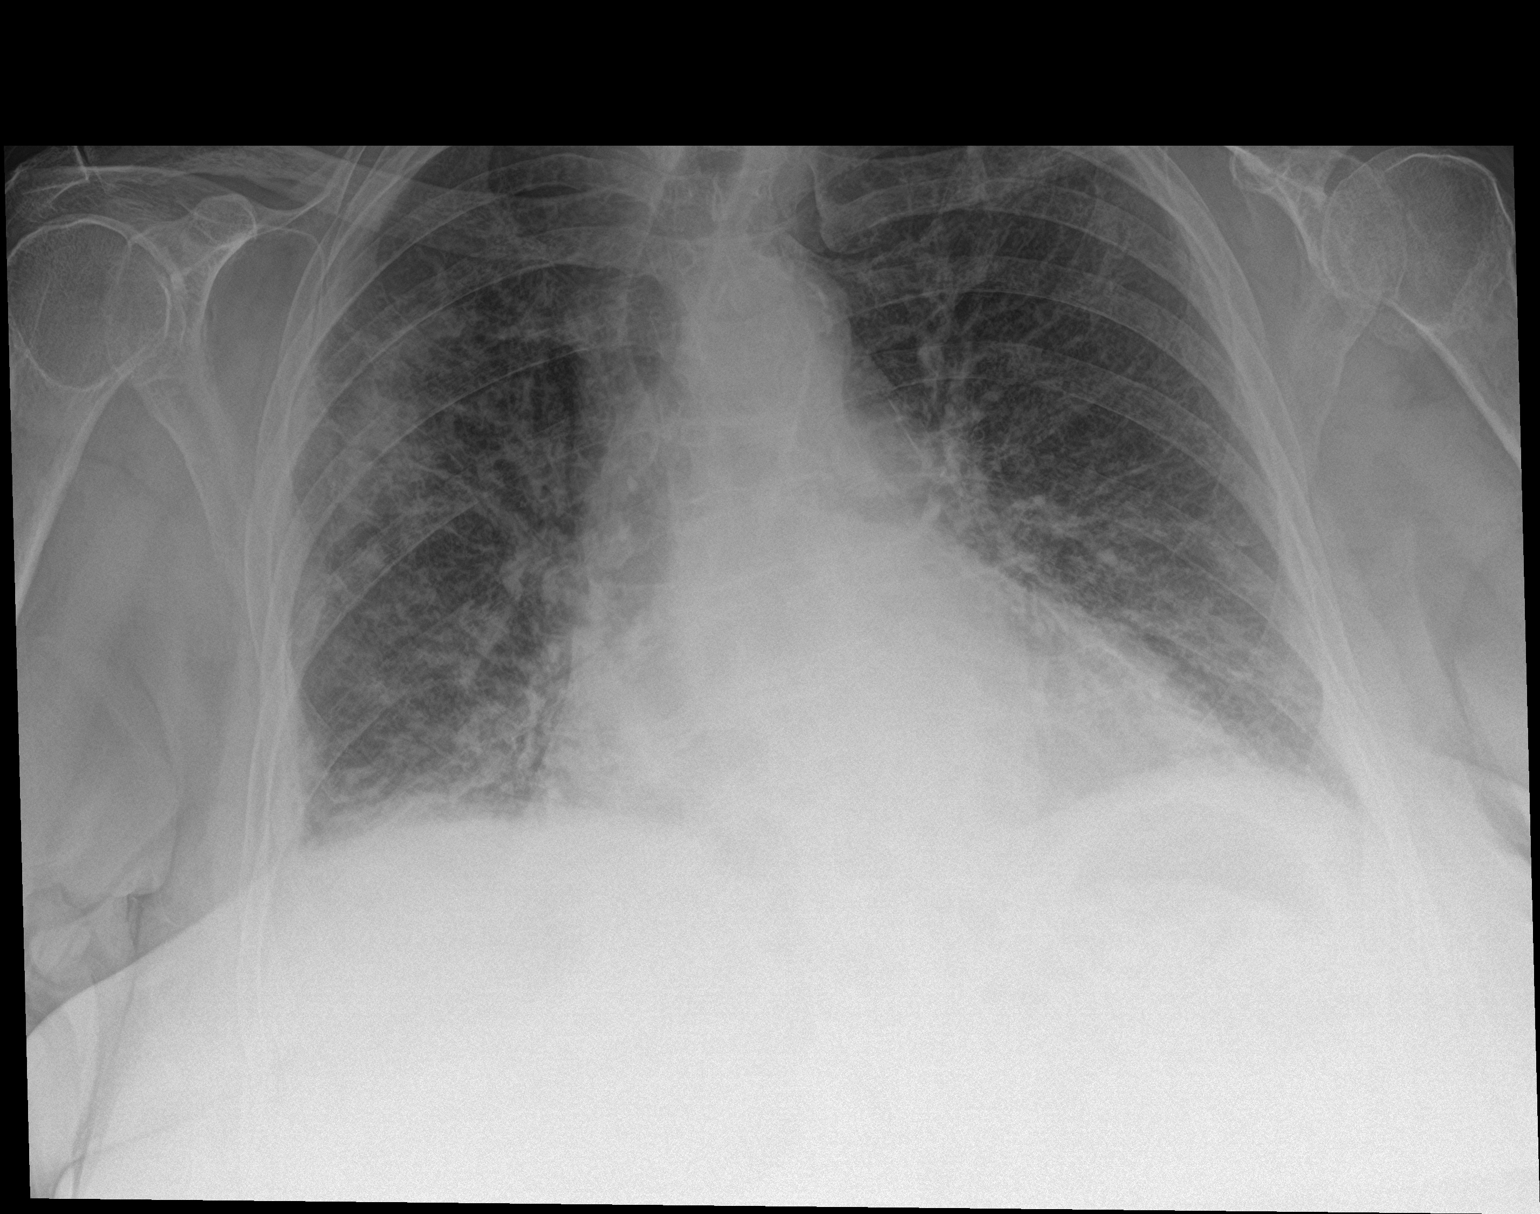

[2 of 2 positions shown; findings below may reference images not displayed]

FINDINGS: Mediastinum and hilar structures are normal. Stable cardiac
persistent but improved right upper lobe. Improving bilateral
interstitial prominence suggesting improving CHF. Small right
pleural effusion again noted. No left pleural effusion on today's
exam. No pneumothorax.
IMPRESSION: 1. partial clearing of right upper lobe infiltrate.

2. Stable cardiomegaly. Partial clearing of bilateral interstitial
prominence consistent with clearing interstitial edema.

## 2018-09-12 ENCOUNTER — Encounter: Payer: Self-pay | Admitting: Adult Health

## 2018-09-12 ENCOUNTER — Non-Acute Institutional Stay (SKILLED_NURSING_FACILITY): Payer: Medicare Other | Admitting: Adult Health

## 2018-09-12 ENCOUNTER — Other Ambulatory Visit: Payer: Self-pay | Admitting: Adult Health

## 2018-09-12 DIAGNOSIS — J9611 Chronic respiratory failure with hypoxia: Secondary | ICD-10-CM | POA: Diagnosis not present

## 2018-09-12 DIAGNOSIS — I5032 Chronic diastolic (congestive) heart failure: Secondary | ICD-10-CM | POA: Diagnosis not present

## 2018-09-12 DIAGNOSIS — F0150 Vascular dementia without behavioral disturbance: Secondary | ICD-10-CM

## 2018-09-12 MED ORDER — HYDROCODONE-ACETAMINOPHEN 5-325 MG PO TABS: 1.0000 | ORAL_TABLET | Freq: Every day | ORAL | 0 refills | Status: DC

## 2018-09-12 NOTE — Progress Notes (Signed)
Location:    [facility] Room Number: 152/W Place of Service:  SNF (31)   CODE STATUS: DNR  Allergies  Allergen Reactions  . Other     Band Aids   . Tape   . Lipitor [Atorvastatin Calcium] Rash and Other (See Comments)    Muscle pain  . Niaspan [Niacin Er] Rash and Other (See Comments)    Muscle pain    Chief Complaint  Patient presents with  . Acute Visit    Care Plan Meeting    HPI:  We have come together for her routine care plan meeting. BIMS 9/15 depression: 0/30. She does spend nearly all of her time in bed per her choice. There are no reports of falls. There are no reports of significant weight change. There are no reports of uncontrolled pain no changes in her appetite.   Past Medical History:  Diagnosis Date  . Anemia   . Atrial fibrillation ([place])   . Back pain   . Bacterial pneumonia   . Cancer (HCC)    Skin   . Chronic respiratory failure ([place])   . Cognitive communication deficit   . COPD (chronic obstructive pulmonary disease) ([place])   . Dementia ([place])   . Dementia ([place])   . Depression   . Diabetes mellitus without complication ([place])   . Diverticulosis   . Diverticulosis   . Fatty liver   . Fibromyalgia   . Fibromyalgia   . Gastric polyp 09/08/11   at the anastomosis-inflammatory  . Generalized anxiety disorder   . Generalized anxiety disorder   . GERD (gastroesophageal reflux disease)   . GERD (gastroesophageal reflux disease)   . 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 Hypercholesteremia   . Hypertension   . Insomnia   . Internal hemorrhoids   . Muscle weakness   . Neuropathy   . On home O2    qhs  . Osteoporosis   . Rheumatoid arteritis ([place])   . Ulcer    stomach  . Vitamin D deficiency     Past Surgical History:  Procedure Laterality Date  . ABDOMINAL HYSTERECTOMY    . ABDOMINAL SURGERY     removed patial stomach from ulcer  . COLONOSCOPY  04-2001   internal hemorrhoids, panocolonic diverticulosis, and  colon polyps   . UPPER GASTROINTESTINAL ENDOSCOPY      Social History   Socioeconomic History  . Marital status: Widowed    Spouse name: Not on file  . Number of children: 6  . Years of education: Not on file  . Highest education level: Not on file  Occupational History  . Occupation: Retired  Scientific laboratory technician  . Financial resource strain: Not hard at all  . Food insecurity    Worry: Never true    Inability: Never true  . Transportation needs    Medical: No    Non-medical: No  Tobacco Use  . Smoking status: Never Smoker  . Smokeless tobacco: Never Used  Substance and Sexual Activity  . Alcohol use: No  . Drug use: No  . Sexual activity: Not on file  Lifestyle  . Physical activity    Days per week: 0 days    Minutes per session: 0 min  . Stress: Not at all  Relationships  . Social Herbalist on phone: Three times a week    Gets together: Three times a week    Attends religious service: Never    Active member of club  or organization: No    Attends meetings of clubs or organizations: Never    Relationship status: Widowed  . Intimate partner violence    Fear of current or ex partner: No    Emotionally abused: No    Physically abused: No    Forced sexual activity: No  Other Topics Concern  . Not on file  Social History Narrative   Daily caffeine    Family History  Problem Relation Age of Onset  . Heart disease Father   . Kidney disease Mother        kidney cancer  . Colon cancer Daughter 42      VITAL SIGNS BP 129/65   Pulse 70   Temp 98.1 F (36.7 C) (Oral)   Resp 20   Ht 5\' 3"  (1.6 m)   Wt 193 lb 3.2 oz (87.6 kg)   SpO2 98%   BMI 34.22 kg/m   Outpatient Encounter Medications as of 09/12/2018  Medication Sig  . acetaminophen (TYLENOL) 325 MG tablet Take 650 mg by mouth every 8 (eight) hours as needed.  Marland Kitchen albuterol (PROAIR HFA) 108 (90 BASE) MCG/ACT inhaler Inhale 2 puffs into the lungs every 6 (six) hours as needed for wheezing or shortness  of breath. For rescue   . apixaban (ELIQUIS) 2.5 MG TABS tablet Take 2.5 mg by mouth 2 (two) times daily.  Roseanne Kaufman Peru-Castor Oil (VENELEX) OINT Apply to sacrum and bilateral buttocks q shift and prn for prevention  . CARTIA XT 300 MG 24 hr capsule TAKE ONE CAPSULE BY MOUTH EVERY DAY  . donepezil (ARICEPT) 5 MG tablet Take 5 mg by mouth at bedtime.  . DULoxetine (CYMBALTA) 60 MG capsule Take 60 mg by mouth daily.    . Fluticasone-Umeclidin-Vilant (TRELEGY ELLIPTA) 100-62.5-25 MCG/INH AEPB One puff inhalation once a day   . Folic Acid 5 MG CAPS Take 5 mg by mouth every Sunday.  . furosemide (LASIX) 40 MG tablet Take 40 mg by mouth daily.  Marland Kitchen guaifenesin (ROBITUSSIN) 100 MG/5ML syrup Take 200 mg by mouth 4 (four) times daily as needed for cough.  Marland Kitchen HYDROcodone-acetaminophen (NORCO/VICODIN) 5-325 MG tablet Take 1 tablet by mouth daily.  Marland Kitchen ipratropium-albuterol (DUONEB) 0.5-2.5 (3) MG/3ML SOLN Take 3 mLs by nebulization 2 (two) times daily as needed.  . Menthol, Topical Analgesic, (BIOFREEZE) 4 % GEL Apply to both knees daily as needed and to left shoulder  . methotrexate (RHEUMATREX) 2.5 MG tablet Take 12.5 mg by mouth once a week. On Sunday - Caution:Chemotherapy. Protect from light.  . Multiple Vitamin (DAILY VITE PO) Take 1 tablet by mouth daily.  . NON FORMULARY Diet Type:  NAS, Consistent Carbohydrates, Dysphagia 3  . OXYGEN Inhale 3.5 L/min into the lungs continuous.  . pantoprazole (PROTONIX) 40 MG tablet Take 40 mg by mouth 2 (two) times daily.  . phenylephrine-shark liver oil-mineral oil-petrolatum (PREPARATION H) 0.25-3-14-71.9 % rectal ointment Place 1 application rectally 2 (two) times daily as needed for hemorrhoids.  . polyethylene glycol (MIRALAX / GLYCOLAX) packet Take 17 g by mouth daily as needed for moderate constipation.  . potassium chloride SA (K-DUR,KLOR-CON) 20 MEQ tablet Take 2 tablets (40 mg ) by mouth twice a day  . pregabalin (LYRICA) 50 MG capsule Take 1 capsule by  mouth once daily  . Propylene Glycol (SYSTANE BALANCE) 0.6 % SOLN Place 1 drop into both eyes 2 (two) times daily.  . raloxifene (EVISTA) 60 MG tablet Take 60 mg by mouth daily.  . rosuvastatin (CRESTOR)  20 MG tablet Take 20 mg by mouth daily.  . simethicone (MYLICON) 80 MG chewable tablet Chew 80 mg by mouth 2 (two) times daily as needed for flatulence.  . Skin Protectants, Misc. (NO-STING SKIN-PREP EX) Apply externally to bilateral heels every shift for prevention   No facility-administered encounter medications on file as of 09/12/2018.      SIGNIFICANT DIAGNOSTIC EXAMS   LABS REVIEWED PREVIOUS :   09-29-17: hgb a1c 6.4 11-08-17: Rheumatoid factor 29.6 (elevated) uric acid 3.6 03-01-18: wbc  6.3; hgb 14.3; hct 48.3; mcv 89.0 plt 183; glucose 107; bun 17; creat 1.26; k+ 4.3; na++ 139; ca 9.7 liver normal albumin 3.3  05-01-18: wbc 5.3 hgb 13.9; hct 46.5; mcv 91.2 plt 125 glucose 125; bun 12; creat 0.86; k+ 4.1; na++ 141; ca 9.7 liver normal albumin 3.4 chol 127 ldl 66; trig 108; hdl 39  06-01-18: hgb a1c 5.9; urine micro-albumin <3.0  NO NEW LABS.   Review of Systems  Constitutional: Negative for malaise/fatigue.  Respiratory: Negative for cough and shortness of breath.   Cardiovascular: Negative for chest pain, palpitations and leg swelling.  Gastrointestinal: Negative for abdominal pain, constipation and heartburn.  Musculoskeletal: Negative for back pain, joint pain and myalgias.  Skin: Negative.   Neurological: Negative for dizziness.  Psychiatric/Behavioral: The patient is not nervous/anxious.      Physical Exam Constitutional:      General: She is not in acute distress.    Appearance: She is well-developed. She is not diaphoretic.  Neck:     Musculoskeletal: Neck supple.     Thyroid: No thyromegaly.  Cardiovascular:     Rate and Rhythm: Normal rate and regular rhythm.     Pulses: Normal pulses.     Heart sounds: Normal heart sounds.  Pulmonary:     Effort: Pulmonary  effort is normal. No respiratory distress.     Breath sounds: Normal breath sounds.     Comments: 02 dependent  Abdominal:     General: Bowel sounds are normal. There is no distension.     Palpations: Abdomen is soft.     Tenderness: There is no abdominal tenderness.  Musculoskeletal:     Right lower leg: No edema.     Left lower leg: No edema.     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  Skin:    General: Skin is warm and dry.     Comments: Bilateral lower extremities disolored   Neurological:     Mental Status: She is alert. Mental status is at baseline.  Psychiatric:        Mood and Affect: Mood normal.      ASSESSMENT/ PLAN:  TODAY;   1. Chronic diastolic heart failure 2. Chronic respiratory failure with hypoxia 3.  Vascular dementia without behavioral disturbance  Will continue current medication regimen Will continue current plan of care Will continue to monitor her status.      MD is aware of resident's narcotic use and is in agreement with current plan of care. We will attempt to wean resident as apropriate   Ok Edwards NP [facility]  Contact [phone] Monday through Friday 8am- 5pm  After hours call [phone]

## 2018-09-21 ENCOUNTER — Other Ambulatory Visit: Payer: Self-pay | Admitting: Adult Health

## 2018-09-26 ENCOUNTER — Encounter (HOSPITAL_COMMUNITY)
Admission: RE | Admit: 2018-09-26 | Discharge: 2018-09-26 | Disposition: A | Discharge status: Home / Self Care | Payer: Medicare Other | Source: Skilled Nursing Facility | Attending: Internal Medicine | Admitting: Internal Medicine

## 2018-09-26 DIAGNOSIS — Z5181 Encounter for therapeutic drug level monitoring: Secondary | ICD-10-CM | POA: Insufficient documentation

## 2018-09-26 DIAGNOSIS — Z79899 Other long term (current) drug therapy: Secondary | ICD-10-CM | POA: Diagnosis not present

## 2018-09-26 LAB — CBC WITH DIFFERENTIAL/PLATELET
Abs Immature Granulocytes: 0.01 10*3/uL (ref 0.00–0.07)
Basophils Absolute: 0.1 10*3/uL (ref 0.0–0.1)
Basophils Relative: 1 %
Eosinophils Absolute: 0.3 10*3/uL (ref 0.0–0.5)
Eosinophils Relative: 4 %
HCT: 52.3 % — ABNORMAL HIGH (ref 36.0–46.0)
Hemoglobin: 16.2 g/dL — ABNORMAL HIGH (ref 12.0–15.0)
Immature Granulocytes: 0 %
Lymphocytes Relative: 39 %
Lymphs Abs: 3.2 10*3/uL (ref 0.7–4.0)
MCH: 29 pg (ref 26.0–34.0)
MCHC: 31 g/dL (ref 30.0–36.0)
MCV: 93.7 fL (ref 80.0–100.0)
Monocytes Absolute: 0.4 10*3/uL (ref 0.1–1.0)
Monocytes Relative: 5 %
Neutro Abs: 4.3 10*3/uL (ref 1.7–7.7)
Neutrophils Relative %: 51 %
Platelets: 156 10*3/uL (ref 150–400)
RBC: 5.58 MIL/uL — ABNORMAL HIGH (ref 3.87–5.11)
RDW: 14.6 % (ref 11.5–15.5)
WBC: 8.3 10*3/uL (ref 4.0–10.5)
nRBC: 0 % (ref 0.0–0.2)

## 2018-09-28 ENCOUNTER — Encounter: Payer: Self-pay | Admitting: Adult Health

## 2018-09-28 ENCOUNTER — Non-Acute Institutional Stay (SKILLED_NURSING_FACILITY): Payer: Medicare Other | Admitting: Adult Health

## 2018-09-28 DIAGNOSIS — K219 Gastro-esophageal reflux disease without esophagitis: Secondary | ICD-10-CM | POA: Diagnosis not present

## 2018-09-28 DIAGNOSIS — J9611 Chronic respiratory failure with hypoxia: Secondary | ICD-10-CM | POA: Diagnosis not present

## 2018-09-28 DIAGNOSIS — M0520 Rheumatoid vasculitis with rheumatoid arthritis of unspecified site: Secondary | ICD-10-CM

## 2018-09-28 DIAGNOSIS — J449 Chronic obstructive pulmonary disease, unspecified: Secondary | ICD-10-CM

## 2018-09-28 NOTE — Progress Notes (Signed)
Location:   [facility] Room Number: [place] of Service:  SNF (31)   CODE STATUS: DNR  Allergies  Allergen Reactions  . Other     Band Aids   . Tape   . Lipitor [Atorvastatin Calcium] Rash and Other (See Comments)    Muscle pain  . Niaspan [Niacin Er] Rash and Other (See Comments)    Muscle pain    Chief Complaint  Patient presents with  . Medical Management of Chronic Issues        Rheumatoid arteritis:  Unspecified COPD/ chronic respiratory failure with hypoxia:  GERD without esophagitis:    HPI:  She is a 85 year old long term resident of this facility being seen for the management of her chronic illnesses: rheumatoid arteritis; copd; chronic respiratory failure gerd. She denies any cough; shortness of breath or heart burn   Past Medical History:  Diagnosis Date  . Anemia   . Atrial fibrillation ([place])   . Back pain   . Bacterial pneumonia   . Cancer (HCC)    Skin   . Chronic respiratory failure ([place])   . Cognitive communication deficit   . COPD (chronic obstructive pulmonary disease) ([place])   . Dementia ([place])   . Dementia ([place])   . Depression   . Diabetes mellitus without complication ([place])   . Diverticulosis   . Diverticulosis   . Fatty liver   . Fibromyalgia   . Fibromyalgia   . Gastric polyp 09/08/11   at the anastomosis-inflammatory  . Generalized anxiety disorder   . Generalized anxiety disorder   . GERD (gastroesophageal reflux disease)   . GERD (gastroesophageal reflux disease)   . 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 Hypercholesteremia   . Hypertension   . Insomnia   . Internal hemorrhoids   . Muscle weakness   . Neuropathy   . On home O2    qhs  . Osteoporosis   . Rheumatoid arteritis ([place])   . Ulcer    stomach  . Vitamin D deficiency     Past Surgical History:  Procedure Laterality Date  . ABDOMINAL HYSTERECTOMY    . ABDOMINAL SURGERY     removed patial stomach from ulcer  . COLONOSCOPY   04-2001   internal hemorrhoids, panocolonic diverticulosis, and colon polyps   . UPPER GASTROINTESTINAL ENDOSCOPY      Social History   Socioeconomic History  . Marital status: Widowed    Spouse name: Not on file  . Number of children: 6  . Years of education: Not on file  . Highest education level: Not on file  Occupational History  . Occupation: Retired  Scientific laboratory technician  . Financial resource strain: Not hard at all  . Food insecurity    Worry: Never true    Inability: Never true  . Transportation needs    Medical: No    Non-medical: No  Tobacco Use  . Smoking status: Never Smoker  . Smokeless tobacco: Never Used  Substance and Sexual Activity  . Alcohol use: No  . Drug use: No  . Sexual activity: Not on file  Lifestyle  . Physical activity    Days per week: 0 days    Minutes per session: 0 min  . Stress: Not at all  Relationships  . Social Herbalist on phone: Three times a week    Gets together: Three times a week    Attends religious service: Never  Active member of club or organization: No    Attends meetings of clubs or organizations: Never    Relationship status: Widowed  . Intimate partner violence    Fear of current or ex partner: No    Emotionally abused: No    Physically abused: No    Forced sexual activity: No  Other Topics Concern  . Not on file  Social History Narrative   Daily caffeine    Family History  Problem Relation Age of Onset  . Heart disease Father   . Kidney disease Mother        kidney cancer  . Colon cancer Daughter 65      VITAL SIGNS BP 119/70   Pulse 67   Temp 98 F (36.7 C)   Resp (!) 22   Ht 5\' 3"  (1.6 m)   Wt 197 lb 3.2 oz (89.4 kg)   SpO2 98%   BMI 34.93 kg/m   Outpatient Encounter Medications as of 09/28/2018  Medication Sig  . acetaminophen (TYLENOL) 325 MG tablet Take 650 mg by mouth every 8 (eight) hours as needed.  Marland Kitchen albuterol (PROAIR HFA) 108 (90 BASE) MCG/ACT inhaler Inhale 2 puffs into the  lungs every 6 (six) hours as needed for wheezing or shortness of breath. For rescue   . apixaban (ELIQUIS) 2.5 MG TABS tablet Take 2.5 mg by mouth 2 (two) times daily.  Roseanne Kaufman [place]-Castor Oil (VENELEX) OINT Apply to sacrum and bilateral buttocks q shift and prn for prevention  . CARTIA XT 300 MG 24 hr capsule TAKE ONE CAPSULE BY MOUTH EVERY DAY  . donepezil (ARICEPT) 5 MG tablet Take 5 mg by mouth at bedtime.  . DULoxetine (CYMBALTA) 60 MG capsule Take 60 mg by mouth daily.    . Fluticasone-Umeclidin-Vilant (TRELEGY ELLIPTA) 100-62.5-25 MCG/INH AEPB One puff inhalation once a day   . Folic Acid 5 MG CAPS Take 5 mg by mouth every Sunday.  . furosemide (LASIX) 40 MG tablet Take 40 mg by mouth daily.  Marland Kitchen guaifenesin (ROBITUSSIN) 100 MG/5ML syrup Take 200 mg by mouth 4 (four) times daily as needed for cough.  Marland Kitchen HYDROcodone-acetaminophen (NORCO/VICODIN) 5-325 MG tablet Take 1 tablet by mouth daily.  Marland Kitchen ipratropium-albuterol (DUONEB) 0.5-2.5 (3) MG/3ML SOLN Take 3 mLs by nebulization 2 (two) times daily as needed.  . Menthol, Topical Analgesic, (BIOFREEZE) 4 % GEL Apply to both knees and bilateral shoulders daily as needed  . methotrexate (RHEUMATREX) 2.5 MG tablet Take 12.5 mg by mouth once a week. On Sunday - Caution:Chemotherapy. Protect from light.  . Multiple Vitamin (DAILY VITE PO) Take 1 tablet by mouth daily.  . NON FORMULARY Diet Type:  NAS, Consistent Carbohydrates, Dysphagia 3  . OXYGEN Inhale 3.5 L/min into the lungs continuous.  . pantoprazole (PROTONIX) 40 MG tablet Take 40 mg by mouth 2 (two) times daily.  . phenylephrine-shark liver oil-mineral oil-petrolatum (PREPARATION H) 0.25-3-14-71.9 % rectal ointment Place 1 application rectally 2 (two) times daily as needed for hemorrhoids.  . polyethylene glycol (MIRALAX / GLYCOLAX) packet Take 17 g by mouth daily as needed for moderate constipation.  . potassium chloride SA (K-DUR,KLOR-CON) 20 MEQ tablet Take 2 tablets (40 mg ) by mouth twice  a day  . pregabalin (LYRICA) 50 MG capsule Take 1 capsule by mouth once daily  . Propylene Glycol (SYSTANE BALANCE) 0.6 % SOLN Place 1 drop into both eyes 2 (two) times daily.  . raloxifene (EVISTA) 60 MG tablet Take 60 mg by mouth daily.  Marland Kitchen  rosuvastatin (CRESTOR) 20 MG tablet Take 20 mg by mouth daily.  . simethicone (MYLICON) 80 MG chewable tablet Chew 80 mg by mouth 2 (two) times daily as needed for flatulence.  . Skin Protectants, Misc. (NO-STING SKIN-PREP EX) Apply externally to bilateral heels every shift for prevention   No facility-administered encounter medications on file as of 09/28/2018.      SIGNIFICANT DIAGNOSTIC EXAMS  LABS REVIEWED PREVIOUS :   09-29-17: hgb a1c 6.4 11-08-17: Rheumatoid factor 29.6 (elevated) uric acid 3.6 03-01-18: wbc  6.3; hgb 14.3; hct 48.3; mcv 89.0 plt 183; glucose 107; bun 17; creat 1.26; k+ 4.3; na++ 139; ca 9.7 liver normal albumin 3.3  05-01-18: wbc 5.3 hgb 13.9; hct 46.5; mcv 91.2 plt 125 glucose 125; bun 12; creat 0.86; k+ 4.1; na++ 141; ca 9.7 liver normal albumin 3.4 chol 127 ldl 66; trig 108; hdl 39  06-01-18: hgb a1c 5.9; urine micro-albumin <3.0  TODAY:   09-26-18: wbc 8.3; hgb 16.2; hct 52.3 mcv 93.7; plt 156    Review of Systems  Constitutional: Negative for malaise/fatigue.  Respiratory: Negative for cough and shortness of breath.   Cardiovascular: Negative for chest pain, palpitations and leg swelling.  Gastrointestinal: Negative for abdominal pain, constipation and heartburn.  Musculoskeletal: Negative for back pain, joint pain and myalgias.  Skin: Negative.   Neurological: Negative for dizziness.  Psychiatric/Behavioral: The patient is not nervous/anxious.    Physical Exam Constitutional:      General: She is not in acute distress.    Appearance: She is well-developed. She is not diaphoretic.  Neck:     Musculoskeletal: Neck supple.     Thyroid: No thyromegaly.  Cardiovascular:     Rate and Rhythm: Normal rate and regular  rhythm.     Pulses: Normal pulses.     Heart sounds: Normal heart sounds.  Pulmonary:     Effort: Pulmonary effort is normal. No respiratory distress.     Breath sounds: Normal breath sounds.     Comments: 02 dependent  Abdominal:     General: Bowel sounds are normal. There is no distension.     Palpations: Abdomen is soft.     Tenderness: There is no abdominal tenderness.  Musculoskeletal: Normal range of motion.     Right lower leg: No edema.     Left lower leg: No edema.     Comments: Unable to move left upper extremity due to frozen left shoulder Able to move other extremities        Lymphadenopathy:     Cervical: No cervical adenopathy.  Skin:    General: Skin is warm and dry.     Comments: Bilateral lower extremities discolored   Neurological:     Mental Status: She is alert. Mental status is at baseline.  Psychiatric:        Mood and Affect: Mood normal.        ASSESSMENT/ PLAN:   TODAY:   1. Rheumatoid arteritis: is stable will continue methotrexate 12.5 mg weekly and folic acid 5 mg weekly   2. Unspecified COPD/ chronic respiratory failure with hypoxia: is stable 02 dependent will continue trelegy ellipta 100-62.5-25 mcg 1 puff daily albuterol 2 puffs every 6 hours as needed and duoneb twice daily as needed  3. GERD without esophagitis: is stable will continue protonix 40 mg daily    PREVIOUS   4. Vascular dementia without behavioral disturbance; is without change; weight is 197 (previous 193,  191) pounds; will continue aricept 5 mg  daily   5. CKD stage 3 due to type 2 diabetes mellitus: is stable bun12; creat 0.86  6 Fibromyalgia: is stable will continue lyrica 50 mg daily   7. Post-menopausal osteoporosis: is stable will continue evista 60 mg daily does take supplements   8. Chronic constipation: is stable will continue miralax daily as needed   9. Type 2 diabetes controlled with peripheral neuropathy: stable hgb a1c 5.9 is off tradjenta will monitor    10. Dyslipidemia associated with type 2 diabetes mellitus: is stable ldl 66 will continue crestor 20 mg daily   11. Diabetic peripheral neuropathy associated with type 2 diabetes mellitus; is stable will continue vicodin 5/325 mg daily and lyrica 50 mg daily   12. Atrial fibrillation with RVR:  Heart rate stable will continue cartia xt 300 mg daily for rate control an eliquis 2.5 mg twice daily   13. Chronic diastolic heart failure: EF 55-60% (07-28-16): is stable will continue lasix 40 mg daily with k+ 40 meq twice daily   14. Hypokalemia: is stable k+ 4.1 will continue k+ 40 meq twice daily     MD is aware of resident's narcotic use and is in agreement with current plan of care. We will attempt to wean resident as apropriate   Ok Edwards NP [facility]  Contact [phone] Monday through Friday 8am- 5pm  After hours call [phone]

## 2018-10-01 ENCOUNTER — Other Ambulatory Visit: Payer: Self-pay | Admitting: Adult Health

## 2018-10-01 MED ORDER — PREGABALIN 50 MG PO CAPS: ORAL_CAPSULE | ORAL | 0 refills | Status: DC

## 2018-10-10 ENCOUNTER — Other Ambulatory Visit: Payer: Self-pay

## 2018-10-16 DIAGNOSIS — Z20828 Contact with and (suspected) exposure to other viral communicable diseases: Secondary | ICD-10-CM | POA: Diagnosis not present

## 2018-10-18 ENCOUNTER — Other Ambulatory Visit: Payer: Self-pay | Admitting: Adult Health

## 2018-10-18 MED ORDER — HYDROCODONE-ACETAMINOPHEN 5-325 MG PO TABS: 1.0000 | ORAL_TABLET | Freq: Every day | ORAL | 0 refills | Status: DC

## 2018-10-30 ENCOUNTER — Other Ambulatory Visit: Payer: Self-pay | Admitting: Adult Health

## 2018-10-30 MED ORDER — PREGABALIN 50 MG PO CAPS: ORAL_CAPSULE | ORAL | 0 refills | Status: DC

## 2018-11-01 ENCOUNTER — Encounter: Payer: Self-pay | Admitting: Adult Health

## 2018-11-01 ENCOUNTER — Non-Acute Institutional Stay (SKILLED_NURSING_FACILITY): Payer: Medicare Other | Admitting: Adult Health

## 2018-11-01 DIAGNOSIS — M797 Fibromyalgia: Secondary | ICD-10-CM | POA: Diagnosis not present

## 2018-11-01 DIAGNOSIS — F0150 Vascular dementia without behavioral disturbance: Secondary | ICD-10-CM | POA: Diagnosis not present

## 2018-11-01 DIAGNOSIS — N183 Chronic kidney disease, stage 3 unspecified: Secondary | ICD-10-CM

## 2018-11-01 DIAGNOSIS — E1122 Type 2 diabetes mellitus with diabetic chronic kidney disease: Secondary | ICD-10-CM

## 2018-11-01 NOTE — Progress Notes (Signed)
Location:   [facility] Room Number: [place] of Service:  SNF (31)   CODE STATUS: DNR  Allergies  Allergen Reactions  . Other     Band Aids   . Tape   . Lipitor [Atorvastatin Calcium] Rash and Other (See Comments)    Muscle pain  . Niaspan [Niacin Er] Rash and Other (See Comments)    Muscle pain    Chief Complaint  Patient presents with  . Medical Management of Chronic Issues        Vascular dementia without behavioral disturbance:  . CKD stage 3 due to type 2 diabetes mellitus: Fibromyalgia:    HPI:  She is a 85 year old long term resident of this facility being seen for the management of her chronic illnesses; dementia; ckd; fibromyalgia. She denies any uncontrolled pain; no changes in her appetite; no anxious or depressive thoughts.    Past Medical History:  Diagnosis Date  . Anemia   . Atrial fibrillation ([place])   . Back pain   . Bacterial pneumonia   . Cancer (HCC)    Skin   . Chronic respiratory failure ([place])   . Cognitive communication deficit   . COPD (chronic obstructive pulmonary disease) ([place])   . Dementia ([place])   . Dementia ([place])   . Depression   . Diabetes mellitus without complication ([place])   . Diverticulosis   . Diverticulosis   . Fatty liver   . Fibromyalgia   . Fibromyalgia   . Gastric polyp 09/08/11   at the anastomosis-inflammatory  . Generalized anxiety disorder   . Generalized anxiety disorder   . GERD (gastroesophageal reflux disease)   . GERD (gastroesophageal reflux disease)   . 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 Hypercholesteremia   . Hypertension   . Insomnia   . Internal hemorrhoids   . Muscle weakness   . Neuropathy   . On home O2    qhs  . Osteoporosis   . Rheumatoid arteritis ([place])   . Ulcer    stomach  . Vitamin D deficiency     Past Surgical History:  Procedure Laterality Date  . ABDOMINAL HYSTERECTOMY    . ABDOMINAL SURGERY     removed patial stomach from ulcer  .  COLONOSCOPY  04-2001   internal hemorrhoids, panocolonic diverticulosis, and colon polyps   . UPPER GASTROINTESTINAL ENDOSCOPY      Social History   Socioeconomic History  . Marital status: Widowed    Spouse name: Not on file  . Number of children: 6  . Years of education: Not on file  . Highest education level: Not on file  Occupational History  . Occupation: Retired  Scientific laboratory technician  . Financial resource strain: Not hard at all  . Food insecurity    Worry: Never true    Inability: Never true  . Transportation needs    Medical: No    Non-medical: No  Tobacco Use  . Smoking status: Never Smoker  . Smokeless tobacco: Never Used  Substance and Sexual Activity  . Alcohol use: No  . Drug use: No  . Sexual activity: Not on file  Lifestyle  . Physical activity    Days per week: 0 days    Minutes per session: 0 min  . Stress: Not at all  Relationships  . Social Herbalist on phone: Three times a week    Gets together: Three times a week    Attends religious  service: Never    Active member of club or organization: No    Attends meetings of clubs or organizations: Never    Relationship status: Widowed  . Intimate partner violence    Fear of current or ex partner: No    Emotionally abused: No    Physically abused: No    Forced sexual activity: No  Other Topics Concern  . Not on file  Social History Narrative   Daily caffeine    Family History  Problem Relation Age of Onset  . Heart disease Father   . Kidney disease Mother        kidney cancer  . Colon cancer Daughter 38      VITAL SIGNS BP (!) 159/83   Pulse 76   Temp 97.6 F (36.4 C)   Resp (!) 24   Ht 5\' 3"  (1.6 m)   Wt 198 lb 6.4 oz (90 kg)   SpO2 91%   BMI 35.14 kg/m   Outpatient Encounter Medications as of 11/01/2018  Medication Sig  . acetaminophen (TYLENOL) 325 MG tablet Take 650 mg by mouth every 8 (eight) hours as needed.  Marland Kitchen albuterol (PROAIR HFA) 108 (90 BASE) MCG/ACT inhaler Inhale 2  puffs into the lungs every 6 (six) hours as needed for wheezing or shortness of breath. For rescue   . apixaban (ELIQUIS) 2.5 MG TABS tablet Take 2.5 mg by mouth 2 (two) times daily.  Roseanne Kaufman Peru-Castor Oil (VENELEX) OINT Apply to sacrum and bilateral buttocks q shift and prn for prevention  . CARTIA XT 300 MG 24 hr capsule TAKE ONE CAPSULE BY MOUTH EVERY DAY  . donepezil (ARICEPT) 5 MG tablet Take 5 mg by mouth at bedtime.  . DULoxetine (CYMBALTA) 60 MG capsule Take 60 mg by mouth daily.    . Fluticasone-Umeclidin-Vilant (TRELEGY ELLIPTA) 100-62.5-25 MCG/INH AEPB One puff inhalation once a day   . Folic Acid 5 MG CAPS Take 5 mg by mouth every Sunday.  . furosemide (LASIX) 40 MG tablet Take 40 mg by mouth daily.  Marland Kitchen guaifenesin (ROBITUSSIN) 100 MG/5ML syrup Take 200 mg by mouth 4 (four) times daily as needed for cough.  Marland Kitchen HYDROcodone-acetaminophen (NORCO/VICODIN) 5-325 MG tablet Take 1 tablet by mouth daily.  Marland Kitchen ipratropium-albuterol (DUONEB) 0.5-2.5 (3) MG/3ML SOLN Take 3 mLs by nebulization 2 (two) times daily as needed.  . Menthol, Topical Analgesic, (BIOFREEZE) 4 % GEL Apply to both knees and bilateral shoulders daily as needed  . methotrexate (RHEUMATREX) 2.5 MG tablet Take 12.5 mg by mouth once a week. On Sunday - Caution:Chemotherapy. Protect from light.  . Multiple Vitamin (DAILY VITE PO) Take 1 tablet by mouth daily.  . NON FORMULARY Diet Type:  NAS, Consistent Carbohydrates, Dysphagia 3  . OXYGEN Inhale 3.5 L/min into the lungs continuous.  . pantoprazole (PROTONIX) 40 MG tablet Take 40 mg by mouth 2 (two) times daily.  . phenylephrine-shark liver oil-mineral oil-petrolatum (PREPARATION H) 0.25-3-14-71.9 % rectal ointment Place 1 application rectally 2 (two) times daily as needed for hemorrhoids.  . polyethylene glycol (MIRALAX / GLYCOLAX) packet Take 17 g by mouth daily as needed for moderate constipation.  . potassium chloride SA (K-DUR,KLOR-CON) 20 MEQ tablet Take 2 tablets (40 mg )  by mouth twice a day  . pregabalin (LYRICA) 50 MG capsule Take 1 capsule by mouth once daily  . Propylene Glycol (SYSTANE BALANCE) 0.6 % SOLN Place 1 drop into both eyes 2 (two) times daily.  . raloxifene (EVISTA) 60 MG tablet Take  60 mg by mouth daily.  . rosuvastatin (CRESTOR) 20 MG tablet Take 20 mg by mouth daily.  . simethicone (MYLICON) 80 MG chewable tablet Chew 80 mg by mouth 2 (two) times daily as needed for flatulence.  . Skin Protectants, Misc. (NO-STING SKIN-PREP EX) Apply externally to bilateral heels every shift for prevention   No facility-administered encounter medications on file as of 11/01/2018.      SIGNIFICANT DIAGNOSTIC EXAMS  LABS REVIEWED PREVIOUS :   11-08-17: Rheumatoid factor 29.6 (elevated) uric acid 3.6 03-01-18: wbc  6.3; hgb 14.3; hct 48.3; mcv 89.0 plt 183; glucose 107; bun 17; creat 1.26; k+ 4.3; na++ 139; ca 9.7 liver normal albumin 3.3  05-01-18: wbc 5.3 hgb 13.9; hct 46.5; mcv 91.2 plt 125 glucose 125; bun 12; creat 0.86; k+ 4.1; na++ 141; ca 9.7 liver normal albumin 3.4 chol 127 ldl 66; trig 108; hdl 39  06-01-18: hgb a1c 5.9; urine micro-albumin <3.0 09-26-18: wbc 8.3; hgb 16.2; hct 52.3 mcv 93.7; plt 156  NO NEW LABS.     Review of Systems  Constitutional: Negative for malaise/fatigue.  Respiratory: Negative for cough and shortness of breath.   Cardiovascular: Negative for chest pain, palpitations and leg swelling.  Gastrointestinal: Negative for abdominal pain, constipation and heartburn.  Musculoskeletal: Negative for back pain, joint pain and myalgias.  Skin: Negative.   Neurological: Negative for dizziness.  Psychiatric/Behavioral: The patient is not nervous/anxious.    Physical Exam Constitutional:      General: She is not in acute distress.    Appearance: She is well-developed. She is not diaphoretic.  Neck:     Musculoskeletal: Neck supple.     Thyroid: No thyromegaly.  Cardiovascular:     Rate and Rhythm: Normal rate and regular  rhythm.     Pulses: Normal pulses.     Heart sounds: Normal heart sounds.  Pulmonary:     Effort: Pulmonary effort is normal. No respiratory distress.     Breath sounds: Normal breath sounds.     Comments: 02 dependent  Abdominal:     General: Bowel sounds are normal. There is no distension.     Palpations: Abdomen is soft.     Tenderness: There is no abdominal tenderness.  Musculoskeletal:     Right lower leg: No edema.     Left lower leg: No edema.     Comments:  Unable to move left upper extremity due to frozen left shoulder Able to move other extremities         Lymphadenopathy:     Cervical: No cervical adenopathy.  Skin:    General: Skin is warm and dry.     Comments: Bilateral lower extremities discolored   Neurological:     Mental Status: She is alert. Mental status is at baseline.  Psychiatric:        Mood and Affect: Mood normal.      ASSESSMENT/ PLAN:  TODAY:   1. Vascular dementia without behavioral disturbance: without change weight is stable at 198 (previous 197,193,191) pounds; will continue aricept 5 mg daily  2. CKD stage 3 due to type 2 diabetes mellitus: is stable bun 12 creat 0.86 will monitor  3. Fibromyalgia: is stable will continue lyrica 50 mg daily   PREVIOUS   4. Post-menopausal osteoporosis: is stable will continue evista 60 mg daily does take supplements   5. Chronic constipation: is stable will continue miralax daily as needed   6. Type 2 diabetes controlled with peripheral neuropathy: stable hgb  a1c 5.9 is off tradjenta will monitor   7. Dyslipidemia associated with type 2 diabetes mellitus: is stable ldl 66 will continue crestor 20 mg daily   8. Diabetic peripheral neuropathy associated with type 2 diabetes mellitus; is stable will continue vicodin 5/325 mg daily and lyrica 50 mg daily   9. Atrial fibrillation with RVR:  Heart rate stable will continue cartia xt 300 mg daily for rate control an eliquis 2.5 mg twice daily   10.  Chronic diastolic heart failure: EF 55-60% (07-28-16): is stable will continue lasix 40 mg daily with k+ 40 meq twice daily   11. Hypokalemia: is stable k+ 4.1 will continue k+ 40 meq twice daily   12. Rheumatoid arteritis: is stable will continue methotrexate 12.5 mg weekly and folic acid 5 mg weekly   13. Unspecified COPD/ chronic respiratory failure with hypoxia: is stable 02 dependent will continue trelegy ellipta 100-62.5-25 mcg 1 puff daily albuterol 2 puffs every 6 hours as needed and duoneb twice daily as needed  14. GERD without esophagitis: is stable will continue protonix 40 mg twice  daily   MD is aware of resident's narcotic use and is in agreement with current plan of care. We will attempt to wean resident as apropriate   Ok Edwards NP [facility]  Contact [phone] Monday through Friday 8am- 5pm  After hours call [phone]

## 2018-11-02 ENCOUNTER — Other Ambulatory Visit (HOSPITAL_COMMUNITY)
Admission: RE | Admit: 2018-11-02 | Discharge: 2018-11-02 | Disposition: A | Discharge status: Home / Self Care | Payer: Medicare Other | Source: Skilled Nursing Facility | Attending: Adult Health | Admitting: Adult Health

## 2018-11-02 DIAGNOSIS — I10 Essential (primary) hypertension: Secondary | ICD-10-CM | POA: Insufficient documentation

## 2018-11-02 LAB — COMPREHENSIVE METABOLIC PANEL
ALT: 22 U/L (ref 0–44)
AST: 25 U/L (ref 15–41)
Albumin: 3.5 g/dL (ref 3.5–5.0)
Alkaline Phosphatase: 75 U/L (ref 38–126)
Anion gap: 9 (ref 5–15)
BUN: 16 mg/dL (ref 8–23)
CO2: 27 mmol/L (ref 22–32)
Calcium: 9.6 mg/dL (ref 8.9–10.3)
Chloride: 106 mmol/L (ref 98–111)
Creatinine, Ser: 1.02 mg/dL — ABNORMAL HIGH (ref 0.44–1.00)
GFR calc Af Amer: 57 mL/min — ABNORMAL LOW (ref >60)
GFR calc non Af Amer: 49 mL/min — ABNORMAL LOW (ref >60)
Glucose, Bld: 117 mg/dL — ABNORMAL HIGH (ref 70–99)
Potassium: 4.2 mmol/L (ref 3.5–5.1)
Sodium: 142 mmol/L (ref 135–145)
Total Bilirubin: 0.6 mg/dL (ref 0.3–1.2)
Total Protein: 6.3 g/dL — ABNORMAL LOW (ref 6.5–8.1)

## 2018-11-02 LAB — HEMOGLOBIN A1C
Hgb A1c MFr Bld: 5.8 % — ABNORMAL HIGH (ref 4.8–5.6)
Mean Plasma Glucose: 119.76 mg/dL

## 2018-11-04 LAB — MICROALBUMIN, URINE: Microalb, Ur: 4.3 ug/mL — ABNORMAL HIGH

## 2018-11-13 ENCOUNTER — Other Ambulatory Visit: Payer: Self-pay | Admitting: Adult Health

## 2018-11-13 MED ORDER — HYDROCODONE-ACETAMINOPHEN 5-325 MG PO TABS: 1.0000 | ORAL_TABLET | Freq: Every day | ORAL | 0 refills | Status: DC

## 2018-11-15 ENCOUNTER — Encounter: Payer: Self-pay | Admitting: Adult Health

## 2018-11-15 ENCOUNTER — Non-Acute Institutional Stay (SKILLED_NURSING_FACILITY): Payer: Medicare Other | Admitting: Adult Health

## 2018-11-15 DIAGNOSIS — F339 Major depressive disorder, recurrent, unspecified: Secondary | ICD-10-CM

## 2018-11-15 NOTE — Progress Notes (Signed)
Location:    [facility] Room Number: 152/W Place of Service:  SNF (31)   CODE STATUS: DNR  Allergies  Allergen Reactions  . Other     Band Aids   . Tape   . Lipitor [Atorvastatin Calcium] Rash and Other (See Comments)    Muscle pain  . Niaspan [Niacin Er] Rash and Other (See Comments)    Muscle pain    Chief Complaint  Patient presents with  . Acute Visit    Pyscho Active Medication Review,     HPI:  We have come together to review her psychoactive medications. She is presently taking cymbalta 60 mg daily. Emotionally she is doing well. She is due to for a dose reduction. She denies any depressive or anxious thoughts. Her weight is stable; there are no reports of changes in her appetite. There are no reports of insomnia.    Past Medical History:  Diagnosis Date  . Anemia   . Atrial fibrillation ([place])   . Back pain   . Bacterial pneumonia   . Cancer (HCC)    Skin   . Chronic respiratory failure ([place])   . Cognitive communication deficit   . COPD (chronic obstructive pulmonary disease) ([place])   . Dementia ([place])   . Dementia ([place])   . Depression   . Diabetes mellitus without complication ([place])   . Diverticulosis   . Diverticulosis   . Fatty liver   . Fibromyalgia   . Fibromyalgia   . Gastric polyp 09/08/11   at the anastomosis-inflammatory  . Generalized anxiety disorder   . Generalized anxiety disorder   . GERD (gastroesophageal reflux disease)   . GERD (gastroesophageal reflux disease)   . 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 Hypercholesteremia   . Hypertension   . Insomnia   . Internal hemorrhoids   . Muscle weakness   . Neuropathy   . On home O2    qhs  . Osteoporosis   . Rheumatoid arteritis ([place])   . Ulcer    stomach  . Vitamin D deficiency     Past Surgical History:  Procedure Laterality Date  . ABDOMINAL HYSTERECTOMY    . ABDOMINAL SURGERY     removed patial stomach from ulcer  . COLONOSCOPY  04-2001   internal hemorrhoids, panocolonic diverticulosis, and colon polyps   . UPPER GASTROINTESTINAL ENDOSCOPY      Social History   Socioeconomic History  . Marital status: Widowed    Spouse name: Not on file  . Number of children: 6  . Years of education: Not on file  . Highest education level: Not on file  Occupational History  . Occupation: Retired  Scientific laboratory technician  . Financial resource strain: Not hard at all  . Food insecurity    Worry: Never true    Inability: Never true  . Transportation needs    Medical: No    Non-medical: No  Tobacco Use  . Smoking status: Never Smoker  . Smokeless tobacco: Never Used  Substance and Sexual Activity  . Alcohol use: No  . Drug use: No  . Sexual activity: Not on file  Lifestyle  . Physical activity    Days per week: 0 days    Minutes per session: 0 min  . Stress: Not at all  Relationships  . Social Herbalist on phone: Three times a week    Gets together: Three times a week    Attends religious service: Never  Active member of club or organization: No    Attends meetings of clubs or organizations: Never    Relationship status: Widowed  . Intimate partner violence    Fear of current or ex partner: No    Emotionally abused: No    Physically abused: No    Forced sexual activity: No  Other Topics Concern  . Not on file  Social History Narrative   Daily caffeine    Family History  Problem Relation Age of Onset  . Heart disease Father   . Kidney disease Mother        kidney cancer  . Colon cancer Daughter 29      VITAL SIGNS BP (!) 140/57   Pulse 77   Temp 98 F (36.7 C) (Oral)   Resp 20   Ht 5\' 3"  (1.6 m)   Wt 198 lb 12.8 oz (90.2 kg)   SpO2 99%   BMI 35.22 kg/m   Outpatient Encounter Medications as of 11/15/2018  Medication Sig  . acetaminophen (TYLENOL) 325 MG tablet Take 650 mg by mouth every 8 (eight) hours as needed.  Marland Kitchen albuterol (PROAIR HFA) 108 (90 BASE) MCG/ACT inhaler Inhale 2 puffs into the  lungs every 6 (six) hours as needed for wheezing or shortness of breath. For rescue   . apixaban (ELIQUIS) 2.5 MG TABS tablet Take 2.5 mg by mouth 2 (two) times daily.  Roseanne Kaufman [place]-Castor Oil (VENELEX) OINT Apply to sacrum and bilateral buttocks q shift and prn for prevention  . CARTIA XT 300 MG 24 hr capsule TAKE ONE CAPSULE BY MOUTH EVERY DAY  . donepezil (ARICEPT) 5 MG tablet Take 5 mg by mouth at bedtime.  . DULoxetine (CYMBALTA) 60 MG capsule Take 60 mg by mouth daily.    . Fluticasone-Umeclidin-Vilant (TRELEGY ELLIPTA) 100-62.5-25 MCG/INH AEPB One puff inhalation once a day   . Folic Acid 5 MG CAPS Take 5 mg by mouth every Sunday.  . furosemide (LASIX) 40 MG tablet Take 40 mg by mouth daily.  Marland Kitchen guaifenesin (ROBITUSSIN) 100 MG/5ML syrup Take 200 mg by mouth 4 (four) times daily as needed for cough.  Marland Kitchen HYDROcodone-acetaminophen (NORCO/VICODIN) 5-325 MG tablet Take 1 tablet by mouth daily.  Marland Kitchen ipratropium-albuterol (DUONEB) 0.5-2.5 (3) MG/3ML SOLN Take 3 mLs by nebulization 2 (two) times daily as needed.  . Menthol, Topical Analgesic, (BIOFREEZE) 4 % GEL Apply to both knees and bilateral shoulders daily as needed  . methotrexate (RHEUMATREX) 2.5 MG tablet Take 12.5 mg by mouth once a week. On Sunday - Caution:Chemotherapy. Protect from light.  . Multiple Vitamin (DAILY VITE PO) Take 1 tablet by mouth daily.  . NON FORMULARY Diet Type:  NAS, Consistent Carbohydrates, Dysphagia 3  . OXYGEN Inhale 3.5 L/min into the lungs continuous.  . pantoprazole (PROTONIX) 40 MG tablet Take 40 mg by mouth 2 (two) times daily.  . phenylephrine-shark liver oil-mineral oil-petrolatum (PREPARATION H) 0.25-3-14-71.9 % rectal ointment Place 1 application rectally 2 (two) times daily as needed for hemorrhoids.  . polyethylene glycol (MIRALAX / GLYCOLAX) packet Take 17 g by mouth daily as needed for moderate constipation.  . potassium chloride SA (K-DUR,KLOR-CON) 20 MEQ tablet Take 2 tablets (40 mg ) by mouth twice  a day  . pregabalin (LYRICA) 50 MG capsule Take 1 capsule by mouth once daily  . Propylene Glycol (SYSTANE BALANCE) 0.6 % SOLN Place 1 drop into both eyes 2 (two) times daily.  . raloxifene (EVISTA) 60 MG tablet Take 60 mg by mouth daily.  Marland Kitchen  rosuvastatin (CRESTOR) 20 MG tablet Take 20 mg by mouth daily.  . simethicone (MYLICON) 80 MG chewable tablet Chew 80 mg by mouth 2 (two) times daily as needed for flatulence.  . Skin Protectants, Misc. (NO-STING SKIN-PREP EX) Apply externally to bilateral heels every shift for prevention   No facility-administered encounter medications on file as of 11/15/2018.      SIGNIFICANT DIAGNOSTIC EXAMS   LABS REVIEWED PREVIOUS :   11-08-17: Rheumatoid factor 29.6 (elevated) uric acid 3.6 03-01-18: wbc  6.3; hgb 14.3; hct 48.3; mcv 89.0 plt 183; glucose 107; bun 17; creat 1.26; k+ 4.3; na++ 139; ca 9.7 liver normal albumin 3.3  05-01-18: wbc 5.3 hgb 13.9; hct 46.5; mcv 91.2 plt 125 glucose 125; bun 12; creat 0.86; k+ 4.1; na++ 141; ca 9.7 liver normal albumin 3.4 chol 127 ldl 66; trig 108; hdl 39  06-01-18: hgb a1c 5.9; urine micro-albumin <3.0 09-26-18: wbc 8.3; hgb 16.2; hct 52.3 mcv 93.7; plt 156  TODAY;   11-02-18: glucose 117; bun 16; creat 1.02; k+ 4.2; na++ 142; ca 9.6 ;liver normal albumin 3.5; hgb a1c 5.8; urine micro-albumin 4.3     Review of Systems  Constitutional: Negative for malaise/fatigue.  Respiratory: Negative for cough and shortness of breath.   Cardiovascular: Negative for chest pain, palpitations and leg swelling.  Gastrointestinal: Negative for abdominal pain, constipation and heartburn.  Musculoskeletal: Negative for back pain, joint pain and myalgias.  Skin: Negative.   Neurological: Negative for dizziness.  Psychiatric/Behavioral: The patient is not nervous/anxious.     Physical Exam Constitutional:      General: She is not in acute distress.    Appearance: She is well-developed and overweight. She is not diaphoretic.  Neck:      Musculoskeletal: Neck supple.     Thyroid: No thyromegaly.  Cardiovascular:     Rate and Rhythm: Normal rate and regular rhythm.     Pulses: Normal pulses.     Heart sounds: Normal heart sounds.  Pulmonary:     Effort: Pulmonary effort is normal. No respiratory distress.     Breath sounds: Normal breath sounds.     Comments: 02 dependent  Abdominal:     General: Bowel sounds are normal. There is no distension.     Palpations: Abdomen is soft.     Tenderness: There is no abdominal tenderness.  Musculoskeletal:     Right lower leg: No edema.     Left lower leg: No edema.     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  Skin:    General: Skin is warm and dry.     Comments: Bilateral lower extremities discolored    Neurological:     Mental Status: She is alert. Mental status is at baseline.  Psychiatric:        Mood and Affect: Mood normal.      ASSESSMENT/ PLAN:  TODAY;   1. Major depression recurrent chronic   She is stable  Will lower her cymbalta to 30 mg daily  Will continue to monitor her status.     MD is aware of resident's narcotic use and is in agreement with current plan of care. We will attempt to wean resident as appropriate.  Ok Edwards NP [facility]  Contact [phone] Monday through Friday 8am- 5pm  After hours call [phone]

## 2018-11-19 DIAGNOSIS — F339 Major depressive disorder, recurrent, unspecified: Secondary | ICD-10-CM | POA: Insufficient documentation

## 2018-11-21 ENCOUNTER — Non-Acute Institutional Stay (SKILLED_NURSING_FACILITY): Payer: Medicare Other | Admitting: Adult Health

## 2018-11-21 DIAGNOSIS — F339 Major depressive disorder, recurrent, unspecified: Secondary | ICD-10-CM

## 2018-11-21 DIAGNOSIS — J9611 Chronic respiratory failure with hypoxia: Secondary | ICD-10-CM

## 2018-11-21 DIAGNOSIS — F0150 Vascular dementia without behavioral disturbance: Secondary | ICD-10-CM

## 2018-11-24 NOTE — Progress Notes (Addendum)
Location:   [facility] Room Number: 100 Place of Service:  SNF (31)   CODE STATUS: dnr   Allergies  Allergen Reactions  . Other     Band Aids   . Tape   . Lipitor [Atorvastatin Calcium] Rash and Other (See Comments)    Muscle pain  . Niaspan [Niacin Er] Rash and Other (See Comments)    Muscle pain    Chief Complaint  Patient presents with  . Acute Visit    care plan meeting     HPI:  We have come together for her care plan meeting. BIMS 10/15 mood 6/30. She has recently had her cymbalta lowered to 30 mg daily . Will monitor her depression. She has not had any falls. She hs gained 5 pounds over the couple of months. She denies any uncontrolled pain; no insomnia. No changes in appetite.    Past Medical History:  Diagnosis Date  . Anemia   . Atrial fibrillation ([place])   . Back pain   . Bacterial pneumonia   . Cancer (HCC)    Skin   . Chronic respiratory failure ([place])   . Cognitive communication deficit   . COPD (chronic obstructive pulmonary disease) ([place])   . Dementia ([place])   . Dementia ([place])   . Depression   . Diabetes mellitus without complication ([place])   . Diverticulosis   . Diverticulosis   . Fatty liver   . Fibromyalgia   . Fibromyalgia   . Gastric polyp 09/08/11   at the anastomosis-inflammatory  . Generalized anxiety disorder   . Generalized anxiety disorder   . GERD (gastroesophageal reflux disease)   . GERD (gastroesophageal reflux disease)   . 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 Hypercholesteremia   . Hypertension   . Insomnia   . Internal hemorrhoids   . Muscle weakness   . Neuropathy   . On home O2    qhs  . Osteoporosis   . Rheumatoid arteritis ([place])   . Ulcer    stomach  . Vitamin D deficiency     Past Surgical History:  Procedure Laterality Date  . ABDOMINAL HYSTERECTOMY    . ABDOMINAL SURGERY     removed patial stomach from ulcer  . COLONOSCOPY  04-2001   internal hemorrhoids, panocolonic diverticulosis, and  colon polyps   . UPPER GASTROINTESTINAL ENDOSCOPY      Social History   Socioeconomic History  . Marital status: Widowed    Spouse name: Not on file  . Number of children: 6  . Years of education: Not on file  . Highest education level: Not on file  Occupational History  . Occupation: Retired  Scientific laboratory technician  . Financial resource strain: Not hard at all  . Food insecurity    Worry: Never true    Inability: Never true  . Transportation needs    Medical: No    Non-medical: No  Tobacco Use  . Smoking status: Never Smoker  . Smokeless tobacco: Never Used  Substance and Sexual Activity  . Alcohol use: No  . Drug use: No  . Sexual activity: Not on file  Lifestyle  . Physical activity    Days per week: 0 days    Minutes per session: 0 min  . Stress: Not at all  Relationships  . Social Herbalist on phone: Three times a week    Gets together: Three times a week    Attends religious service: Never  Active member of club or organization: No    Attends meetings of clubs or organizations: Never    Relationship status: Widowed  . Intimate partner violence    Fear of current or ex partner: No    Emotionally abused: No    Physically abused: No    Forced sexual activity: No  Other Topics Concern  . Not on file  Social History Narrative   Daily caffeine    Family History  Problem Relation Age of Onset  . Heart disease Father   . Kidney disease Mother        kidney cancer  . Colon cancer Daughter 57      VITAL SIGNS BP 135/65   Pulse 74   Temp (!) 96.8 F (36 C)   Ht 5\' 3"  (1.6 m)   Wt 198 lb (89.8 kg)   BMI 35.07 kg/m   Outpatient Encounter Medications as of 11/21/2018  Medication Sig  . acetaminophen (TYLENOL) 325 MG tablet Take 650 mg by mouth every 8 (eight) hours as needed.  Marland Kitchen albuterol (PROAIR HFA) 108 (90 BASE) MCG/ACT inhaler Inhale 2 puffs into the lungs every 6 (six) hours as needed for wheezing or shortness of breath. For rescue   .  apixaban (ELIQUIS) 2.5 MG TABS tablet Take 2.5 mg by mouth 2 (two) times daily.  Roseanne Kaufman Peru-Castor Oil (VENELEX) OINT Apply to sacrum and bilateral buttocks q shift and prn for prevention  . CARTIA XT 300 MG 24 hr capsule TAKE ONE CAPSULE BY MOUTH EVERY DAY  . donepezil (ARICEPT) 5 MG tablet Take 5 mg by mouth at bedtime.  . DULoxetine (CYMBALTA) 30 MG capsule Take 30 mg by mouth daily.    . Fluticasone-Umeclidin-Vilant (TRELEGY ELLIPTA) 100-62.5-25 MCG/INH AEPB One puff inhalation once a day   . Folic Acid 5 MG CAPS Take 5 mg by mouth every Sunday.  . furosemide (LASIX) 40 MG tablet Take 40 mg by mouth daily.  Marland Kitchen guaifenesin (ROBITUSSIN) 100 MG/5ML syrup Take 200 mg by mouth 4 (four) times daily as needed for cough.  Marland Kitchen HYDROcodone-acetaminophen (NORCO/VICODIN) 5-325 MG tablet Take 1 tablet by mouth daily.  Marland Kitchen ipratropium-albuterol (DUONEB) 0.5-2.5 (3) MG/3ML SOLN Take 3 mLs by nebulization 2 (two) times daily as needed.  . Menthol, Topical Analgesic, (BIOFREEZE) 4 % GEL Apply to both knees and bilateral shoulders daily as needed  . methotrexate (RHEUMATREX) 2.5 MG tablet Take 12.5 mg by mouth once a week. On Sunday - Caution:Chemotherapy. Protect from light.  . Multiple Vitamin (DAILY VITE PO) Take 1 tablet by mouth daily.  . NON FORMULARY Diet Type:  NAS, Consistent Carbohydrates, Dysphagia 3  . OXYGEN Inhale 3.5 L/min into the lungs continuous.  . pantoprazole (PROTONIX) 40 MG tablet Take 40 mg by mouth 2 (two) times daily.  . phenylephrine-shark liver oil-mineral oil-petrolatum (PREPARATION H) 0.25-3-14-71.9 % rectal ointment Place 1 application rectally 2 (two) times daily as needed for hemorrhoids.  . polyethylene glycol (MIRALAX / GLYCOLAX) packet Take 17 g by mouth daily as needed for moderate constipation.  . potassium chloride SA (K-DUR,KLOR-CON) 20 MEQ tablet Take 2 tablets (40 mg ) by mouth twice a day  . pregabalin (LYRICA) 50 MG capsule Take 1 capsule by mouth once daily  .  Propylene Glycol (SYSTANE BALANCE) 0.6 % SOLN Place 1 drop into both eyes 2 (two) times daily.  . raloxifene (EVISTA) 60 MG tablet Take 60 mg by mouth daily.  . rosuvastatin (CRESTOR) 20 MG tablet Take 20 mg by  mouth daily.  . simethicone (MYLICON) 80 MG chewable tablet Chew 80 mg by mouth 2 (two) times daily as needed for flatulence.  . Skin Protectants, Misc. (NO-STING SKIN-PREP EX) Apply externally to bilateral heels every shift for prevention   No facility-administered encounter medications on file as of 11/21/2018.      SIGNIFICANT DIAGNOSTIC EXAMS   LABS REVIEWED PREVIOUS :   11-08-17: Rheumatoid factor 29.6 (elevated) uric acid 3.6 03-01-18: wbc  6.3; hgb 14.3; hct 48.3; mcv 89.0 plt 183; glucose 107; bun 17; creat 1.26; k+ 4.3; na++ 139; ca 9.7 liver normal albumin 3.3  05-01-18: wbc 5.3 hgb 13.9; hct 46.5; mcv 91.2 plt 125 glucose 125; bun 12; creat 0.86; k+ 4.1; na++ 141; ca 9.7 liver normal albumin 3.4 chol 127 ldl 66; trig 108; hdl 39  06-01-18: hgb a1c 5.9; urine micro-albumin <3.0 09-26-18: wbc 8.3; hgb 16.2; hct 52.3 mcv 93.7; plt 156 11-02-18: glucose 117; bun 16; creat 1.02; k+ 4.2; na++ 142; ca 9.6 ;liver normal albumin 3.5; hgb a1c 5.8; urine micro-albumin 4.3    NO NEW LABS.    Review of Systems  Constitutional: Negative for malaise/fatigue.  Respiratory: Negative for cough and shortness of breath.   Cardiovascular: Negative for chest pain, palpitations and leg swelling.  Gastrointestinal: Negative for abdominal pain, constipation and heartburn.  Musculoskeletal: Negative for back pain, joint pain and myalgias.  Skin: Negative.   Neurological: Negative for dizziness.  Psychiatric/Behavioral: The patient is not nervous/anxious.     Physical Exam Constitutional:      General: She is not in acute distress.    Appearance: She is well-developed. She is not diaphoretic.  Neck:     Musculoskeletal: Neck supple.     Thyroid: No thyromegaly.  Cardiovascular:     Rate  and Rhythm: Normal rate and regular rhythm.     Pulses: Normal pulses.     Heart sounds: Normal heart sounds.  Pulmonary:     Effort: Pulmonary effort is normal. No respiratory distress.     Breath sounds: Normal breath sounds.     Comments: 02 dependent  Abdominal:     General: Bowel sounds are normal. There is no distension.     Palpations: Abdomen is soft.     Tenderness: There is no abdominal tenderness.  Musculoskeletal:     Right lower leg: No edema.     Left lower leg: No edema.     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  Skin:    General: Skin is warm and dry.     Comments: Bilateral lower extremities discolored     Neurological:     Mental Status: She is alert. Mental status is at baseline.  Psychiatric:        Mood and Affect: Mood normal.         ASSESSMENT/ PLAN:  TODAY;   1. Major depression recurrent chronic 2. Chronic respiratory failure with hypoxia 3. Vascular dementia without behavioral disturbance   Will continue her current medication regimen Will continue current plan of care Will monitor her status.        MD is aware of resident's narcotic use and is in agreement with current plan of care. We will attempt to wean resident as appropriate.  Ok Edwards NP [facility]  Contact [phone] Monday through Friday 8am-  5pm  After hours call [phone]

## 2018-11-24 NOTE — Addendum Note (Signed)
Addended by: Gerlene Fee on: 11/24/2018 10:21 PM   Modules accepted: Orders

## 2018-11-27 ENCOUNTER — Other Ambulatory Visit: Payer: Self-pay | Admitting: Adult Health

## 2018-11-27 MED ORDER — PREGABALIN 50 MG PO CAPS: ORAL_CAPSULE | ORAL | 0 refills | Status: DC

## 2018-12-06 ENCOUNTER — Other Ambulatory Visit: Payer: Self-pay | Admitting: Adult Health

## 2018-12-06 ENCOUNTER — Encounter: Payer: Self-pay | Admitting: Adult Health

## 2018-12-06 ENCOUNTER — Non-Acute Institutional Stay (SKILLED_NURSING_FACILITY): Payer: Medicare Other | Admitting: Adult Health

## 2018-12-06 DIAGNOSIS — M0520 Rheumatoid vasculitis with rheumatoid arthritis of unspecified site: Secondary | ICD-10-CM

## 2018-12-06 DIAGNOSIS — E1142 Type 2 diabetes mellitus with diabetic polyneuropathy: Secondary | ICD-10-CM | POA: Diagnosis not present

## 2018-12-06 DIAGNOSIS — M797 Fibromyalgia: Secondary | ICD-10-CM

## 2018-12-06 MED ORDER — HYDROCODONE-ACETAMINOPHEN 5-325 MG PO TABS: 1.0000 | ORAL_TABLET | ORAL | 0 refills | Status: AC

## 2018-12-06 MED ORDER — PREGABALIN 50 MG PO CAPS: ORAL_CAPSULE | ORAL | 0 refills | Status: DC

## 2018-12-06 NOTE — Progress Notes (Signed)
Location:    [facility] Room Number: 152/W Place of Service:  SNF (31)   CODE STATUS: DNR  Allergies  Allergen Reactions  . Other     Band Aids   . Tape   . Lipitor [Atorvastatin Calcium] Rash and Other (See Comments)    Muscle pain  . Niaspan [Niacin Er] Rash and Other (See Comments)    Muscle pain   Chief Complaint  Patient presents with  . Medical Management of Chronic Issues         Diabetic peripheral neuropathy associated with type 2 diabetes mellitus:  Fibromyalgia:  Rheumatoid arteritis:    HPI:  She is a 85 year old long term resident of this facility being seen for the management of her chronic illnesses: peripheral neuropathy; fibromyalgia; ra. She is having chronic back pain and right wrist pain. She has been on vicodin daily for an expended period of time. More than likely this medication is not providing her with any benefit. She denies any changes in her appetite; denies any cough or shortness of breath.   Past Medical History:  Diagnosis Date  . Anemia   . Atrial fibrillation ([place])   . Back pain   . Bacterial pneumonia   . Cancer (HCC)    Skin   . Chronic respiratory failure ([place])   . Cognitive communication deficit   . COPD (chronic obstructive pulmonary disease) ([place])   . Dementia ([place])   . Dementia ([place])   . Depression   . Diabetes mellitus without complication ([place])   . Diverticulosis   . Diverticulosis   . Fatty liver   . Fibromyalgia   . Fibromyalgia   . Gastric polyp 09/08/11   at the anastomosis-inflammatory  . Generalized anxiety disorder   . Generalized anxiety disorder   . GERD (gastroesophageal reflux disease)   . GERD (gastroesophageal reflux disease)   . 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 Hypercholesteremia   . Hypertension   . Insomnia   . Internal hemorrhoids   . Muscle weakness   . Neuropathy   . On home O2    qhs  . Osteoporosis   . Rheumatoid arteritis ([place])   . Ulcer    stomach  .  Vitamin D deficiency     Past Surgical History:  Procedure Laterality Date  . ABDOMINAL HYSTERECTOMY    . ABDOMINAL SURGERY     removed patial stomach from ulcer  . COLONOSCOPY  04-2001   internal hemorrhoids, panocolonic diverticulosis, and colon polyps   . UPPER GASTROINTESTINAL ENDOSCOPY      Social History   Socioeconomic History  . Marital status: Widowed    Spouse name: Not on file  . Number of children: 6  . Years of education: Not on file  . Highest education level: Not on file  Occupational History  . Occupation: Retired  Scientific laboratory technician  . Financial resource strain: Not hard at all  . Food insecurity    Worry: Never true    Inability: Never true  . Transportation needs    Medical: No    Non-medical: No  Tobacco Use  . Smoking status: Never Smoker  . Smokeless tobacco: Never Used  Substance and Sexual Activity  . Alcohol use: No  . Drug use: No  . Sexual activity: Not on file  Lifestyle  . Physical activity    Days per week: 0 days    Minutes per session: 0 min  . Stress: Not at all  Relationships  . Social Herbalist on phone: Three times a week    Gets together: Three times a week    Attends religious service: Never    Active member of club or organization: No    Attends meetings of clubs or organizations: Never    Relationship status: Widowed  . Intimate partner violence    Fear of current or ex partner: No    Emotionally abused: No    Physically abused: No    Forced sexual activity: No  Other Topics Concern  . Not on file  Social History Narrative   Daily caffeine    Family History  Problem Relation Age of Onset  . Heart disease Father   . Kidney disease Mother        kidney cancer  . Colon cancer Daughter 22      VITAL SIGNS BP (!) 150/85   Pulse 73   Temp (!) 97.5 F (36.4 C) (Oral)   Resp 20   Ht 5\' 3"  (1.6 m)   Wt 198 lb 12.8 oz (90.2 kg)   SpO2 98%   BMI 35.22 kg/m   Outpatient Encounter Medications as of  12/06/2018  Medication Sig  . acetaminophen (TYLENOL) 325 MG tablet Take 650 mg by mouth every 8 (eight) hours as needed.  Marland Kitchen apixaban (ELIQUIS) 2.5 MG TABS tablet Take 2.5 mg by mouth 2 (two) times daily.  Roseanne Kaufman Peru-Castor Oil (VENELEX) OINT Apply to sacrum and bilateral buttocks q shift and prn for prevention  . CARTIA XT 300 MG 24 hr capsule TAKE ONE CAPSULE BY MOUTH EVERY DAY  . donepezil (ARICEPT) 5 MG tablet Take 5 mg by mouth at bedtime.  . DULoxetine (CYMBALTA) 30 MG capsule Take 30 mg by mouth daily.  . Fluticasone-Umeclidin-Vilant (TRELEGY ELLIPTA) 100-62.5-25 MCG/INH AEPB One puff inhalation once a day   . Folic Acid 5 MG CAPS Take 5 mg by mouth every Sunday.  . furosemide (LASIX) 40 MG tablet Take 40 mg by mouth daily.  Marland Kitchen guaifenesin (ROBITUSSIN) 100 MG/5ML syrup Take 200 mg by mouth 4 (four) times daily as needed for cough.  Marland Kitchen HYDROcodone-acetaminophen (NORCO/VICODIN) 5-325 MG tablet Take 1 tablet by mouth daily.  . Menthol, Topical Analgesic, (BIOFREEZE) 4 % GEL Apply to both knees and bilateral shoulders daily as needed  . Menthol, Topical Analgesic, (BIOFREEZE) 4 % GEL Apply to left and right shoulder Once A Day  . methotrexate (RHEUMATREX) 2.5 MG tablet Take 12.5 mg by mouth once a week. On Sunday - Caution:Chemotherapy. Protect from light.  . Multiple Vitamin (DAILY VITE PO) Take 1 tablet by mouth daily.  . NON FORMULARY Diet Type:  NAS, Consistent Carbohydrates, Dysphagia 3  . OXYGEN Inhale 3.5 L/min into the lungs continuous.  . pantoprazole (PROTONIX) 40 MG tablet Take 40 mg by mouth 2 (two) times daily.  . phenylephrine-shark liver oil-mineral oil-petrolatum (PREPARATION H) 0.25-3-14-71.9 % rectal ointment Place 1 application rectally 2 (two) times daily as needed for hemorrhoids.  . polyethylene glycol (MIRALAX / GLYCOLAX) packet Take 17 g by mouth daily as needed for moderate constipation.  . potassium chloride SA (K-DUR,KLOR-CON) 20 MEQ tablet Take 2 tablets (40 mg )  by mouth twice a day  . pregabalin (LYRICA) 50 MG capsule Take 1 capsule by mouth once daily  . Propylene Glycol (SYSTANE BALANCE) 0.6 % SOLN Place 1 drop into both eyes 2 (two) times daily.  . raloxifene (EVISTA) 60 MG tablet Take 60 mg by mouth  daily.  . rosuvastatin (CRESTOR) 20 MG tablet Take 20 mg by mouth daily.  . Skin Protectants, Misc. (NO-STING SKIN-PREP EX) Apply externally to bilateral heels every shift for prevention  . [DISCONTINUED] albuterol (PROAIR HFA) 108 (90 BASE) MCG/ACT inhaler Inhale 2 puffs into the lungs every 6 (six) hours as needed for wheezing or shortness of breath. For rescue   . [DISCONTINUED] ipratropium-albuterol (DUONEB) 0.5-2.5 (3) MG/3ML SOLN Take 3 mLs by nebulization 2 (two) times daily as needed.  . [DISCONTINUED] simethicone (MYLICON) 80 MG chewable tablet Chew 80 mg by mouth 2 (two) times daily as needed for flatulence.   No facility-administered encounter medications on file as of 12/06/2018.      SIGNIFICANT DIAGNOSTIC EXAMS   LABS REVIEWED PREVIOUS :   11-08-17: Rheumatoid factor 29.6 (elevated) uric acid 3.6 03-01-18: wbc  6.3; hgb 14.3; hct 48.3; mcv 89.0 plt 183; glucose 107; bun 17; creat 1.26; k+ 4.3; na++ 139; ca 9.7 liver normal albumin 3.3  05-01-18: wbc 5.3 hgb 13.9; hct 46.5; mcv 91.2 plt 125 glucose 125; bun 12; creat 0.86; k+ 4.1; na++ 141; ca 9.7 liver normal albumin 3.4 chol 127 ldl 66; trig 108; hdl 39  06-01-18: hgb a1c 5.9; urine micro-albumin <3.0 09-26-18: wbc 8.3; hgb 16.2; hct 52.3 mcv 93.7; plt 156 11-02-18: glucose 117; bun 16; creat 1.02; k+ 4.2; na++ 142; ca 9.6 ;liver normal albumin 3.5; hgb a1c 5.8; urine micro-albumin 4.3    NO NEW LABS.   Review of Systems  Constitutional: Negative for malaise/fatigue.  Respiratory: Negative for cough and shortness of breath.   Cardiovascular: Negative for chest pain, palpitations and leg swelling.  Gastrointestinal: Negative for abdominal pain, constipation and heartburn.   Musculoskeletal: Positive for back pain and joint pain. Negative for myalgias.       Back and right wrist pain   Skin: Negative.   Neurological: Negative for dizziness.  Psychiatric/Behavioral: The patient is not nervous/anxious.       Physical Exam Constitutional:      General: She is not in acute distress.    Appearance: She is well-developed. She is not diaphoretic.  Neck:     Musculoskeletal: Neck supple.     Thyroid: No thyromegaly.  Cardiovascular:     Rate and Rhythm: Normal rate and regular rhythm.     Pulses: Normal pulses.     Heart sounds: Normal heart sounds.  Pulmonary:     Effort: Pulmonary effort is normal. No respiratory distress.     Breath sounds: Normal breath sounds.     Comments: 02 dependent  Abdominal:     General: Bowel sounds are normal. There is no distension.     Palpations: Abdomen is soft.     Tenderness: There is no abdominal tenderness.  Musculoskeletal:     Right lower leg: No edema.     Left lower leg: No edema.     Comments: Unable to move left upper extremity due to frozen left shoulder Able to move other extremities            Lymphadenopathy:     Cervical: No cervical adenopathy.  Skin:    General: Skin is warm and dry.     Comments: Bilateral lower extremities discolored   Neurological:     Mental Status: She is alert. Mental status is at baseline.  Psychiatric:        Mood and Affect: Mood normal.     ASSESSMENT/ PLAN:  TODAY:   1. Diabetic peripheral neuropathy associated  with type 2 diabetes mellitus: pain is not managed: doubtful is vicodin is providing him with benefit; will stop vicodin will increase lyrica to 50 mg twice daily   2. Fibromyalgia: pain is not managed will increase lyrica to 50 mg twice daily  3. Rheumatoid arteritis: is stable will continue methotrexate 12.5 mg weekly and folic acid 5 mg weekly.   PREVIOUS   4. Post-menopausal osteoporosis: is stable will continue evista 60 mg daily does take  supplements   5. Chronic constipation: is stable will continue miralax daily as needed   6. Type 2 diabetes controlled with peripheral neuropathy: stable hgb a1c 5.9 is off tradjenta will monitor   7. Dyslipidemia associated with type 2 diabetes mellitus: is stable ldl 66 will continue crestor 20 mg daily   8. Atrial fibrillation with RVR:  Heart rate stable will continue cartia xt 300 mg daily for rate control an eliquis 2.5 mg twice daily   9. Chronic diastolic heart failure: EF 55-60% (07-28-16): is stable will continue lasix 40 mg daily with k+ 40 meq twice daily   10. Hypokalemia: is stable k+ 4.1 will continue k+ 40 meq twice daily   11. Unspecified COPD/ chronic respiratory failure with hypoxia: is stable 02 dependent will continue trelegy ellipta 100-62.5-25 mcg 1 puff daily albuterol 2 puffs every 6 hours as needed and duoneb twice daily as needed  12. GERD without esophagitis: is stable will continue protonix 40 mg twice  daily   13. Vascular dementia without behavioral disturbance: without change weight is stable at 198 (previous 197,193,191) pounds; will continue aricept 5 mg daily  14. CKD stage 3 due to type 2 diabetes mellitus: is stable bun 12 creat 0.86 will monitor      MD is aware of resident's narcotic use and is in agreement with current plan of care. We will attempt to wean resident as appropriate.  Ok Edwards NP [facility]  Contact [phone] Monday through Friday 8am- 5pm  After hours call [phone]

## 2019-01-02 ENCOUNTER — Other Ambulatory Visit: Payer: Self-pay | Admitting: Adult Health

## 2019-01-02 MED ORDER — PREGABALIN 50 MG PO CAPS: ORAL_CAPSULE | ORAL | 0 refills | Status: DC

## 2019-01-03 ENCOUNTER — Other Ambulatory Visit: Payer: Self-pay | Admitting: Adult Health

## 2019-01-03 DIAGNOSIS — M810 Age-related osteoporosis without current pathological fracture: Secondary | ICD-10-CM | POA: Diagnosis not present

## 2019-01-03 DIAGNOSIS — R936 Abnormal findings on diagnostic imaging of limbs: Secondary | ICD-10-CM | POA: Diagnosis not present

## 2019-01-04 DIAGNOSIS — L603 Nail dystrophy: Secondary | ICD-10-CM | POA: Diagnosis not present

## 2019-01-04 DIAGNOSIS — B351 Tinea unguium: Secondary | ICD-10-CM | POA: Diagnosis not present

## 2019-01-04 DIAGNOSIS — E1151 Type 2 diabetes mellitus with diabetic peripheral angiopathy without gangrene: Secondary | ICD-10-CM | POA: Diagnosis not present

## 2019-01-04 DIAGNOSIS — Z794 Long term (current) use of insulin: Secondary | ICD-10-CM | POA: Diagnosis not present

## 2019-01-04 DIAGNOSIS — E1140 Type 2 diabetes mellitus with diabetic neuropathy, unspecified: Secondary | ICD-10-CM | POA: Diagnosis not present

## 2019-01-07 ENCOUNTER — Encounter: Payer: Self-pay | Admitting: Adult Health

## 2019-01-07 ENCOUNTER — Non-Acute Institutional Stay (SKILLED_NURSING_FACILITY): Payer: Medicare Other | Admitting: Adult Health

## 2019-01-07 DIAGNOSIS — J449 Chronic obstructive pulmonary disease, unspecified: Secondary | ICD-10-CM | POA: Diagnosis not present

## 2019-01-07 DIAGNOSIS — J9611 Chronic respiratory failure with hypoxia: Secondary | ICD-10-CM

## 2019-01-07 NOTE — Progress Notes (Signed)
Location:    [place] Room Number: 152/W Place of Service:  SNF (31)   CODE STATUS: DNR  Allergies  Allergen Reactions  . Other     Band Aids   . Tape   . Lipitor [Atorvastatin Calcium] Rash and Other (See Comments)    Muscle pain  . Niaspan [Niacin Er] Rash and Other (See Comments)    Muscle pain    Chief Complaint  Patient presents with  . Acute Visit    Cough and Congestion     HPI:  Staff reports that she is more congested and is having a worsening cough. There are no reports of fevers present. She does complain of shortness of breath. She denies any sputum production. She is 4 dependent.   Past Medical History:  Diagnosis Date  . Anemia   . Atrial fibrillation ([place])   . Back pain   . Bacterial pneumonia   . Cancer (HCC)    Skin   . Chronic respiratory failure ([place])   . Cognitive communication deficit   . COPD (chronic obstructive pulmonary disease) ([place])   . Dementia ([place])   . Dementia ([place])   . Depression   . Diabetes mellitus without complication ([place])   . Diverticulosis   . Diverticulosis   . Fatty liver   . Fibromyalgia   . Fibromyalgia   . Gastric polyp 09/08/11   at the anastomosis-inflammatory  . Generalized anxiety disorder   . Generalized anxiety disorder   . GERD (gastroesophageal reflux disease)   . GERD (gastroesophageal reflux disease)   . 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 Hypercholesteremia   . Hypertension   . Insomnia   . Internal hemorrhoids   . Muscle weakness   . Neuropathy   . On home O2    qhs  . Osteoporosis   . Rheumatoid arteritis ([place])   . Ulcer    stomach  . Vitamin D deficiency     Past Surgical History:  Procedure Laterality Date  . ABDOMINAL HYSTERECTOMY    . ABDOMINAL SURGERY     removed patial stomach from ulcer  . COLONOSCOPY  04-2001   internal hemorrhoids, panocolonic diverticulosis, and colon polyps   . UPPER GASTROINTESTINAL ENDOSCOPY      Social History    Socioeconomic History  . Marital status: Widowed    Spouse name: Not on file  . Number of children: 6  . Years of education: Not on file  . Highest education level: Not on file  Occupational History  . Occupation: Retired  Scientific laboratory technician  . Financial resource strain: Not hard at all  . Food insecurity    Worry: Never true    Inability: Never true  . Transportation needs    Medical: No    Non-medical: No  Tobacco Use  . Smoking status: Never Smoker  . Smokeless tobacco: Never Used  Substance and Sexual Activity  . Alcohol use: No  . Drug use: No  . Sexual activity: Not on file  Lifestyle  . Physical activity    Days per week: 0 days    Minutes per session: 0 min  . Stress: Not at all  Relationships  . Social Herbalist on phone: Three times a week    Gets together: Three times a week    Attends religious service: Never    Active member of club or organization: No    Attends meetings of clubs or organizations: Never  Relationship status: Widowed  . Intimate partner violence    Fear of current or ex partner: No    Emotionally abused: No    Physically abused: No    Forced sexual activity: No  Other Topics Concern  . Not on file  Social History Narrative   Daily caffeine    Family History  Problem Relation Age of Onset  . Heart disease Father   . Kidney disease Mother        kidney cancer  . Colon cancer Daughter 33      VITAL SIGNS BP (!) 152/75   Pulse 69   Temp 98.8 F (37.1 C) (Oral)   Resp 20   Ht 5\' 3"  (1.6 m)   Wt 199 lb 12.8 oz (90.6 kg)   SpO2 96%   BMI 35.39 kg/m   Outpatient Encounter Medications as of 01/07/2019  Medication Sig  . acetaminophen (TYLENOL) 325 MG tablet Take 650 mg by mouth every 8 (eight) hours as needed.  Marland Kitchen apixaban (ELIQUIS) 2.5 MG TABS tablet Take 2.5 mg by mouth 2 (two) times daily.  Roseanne Kaufman Peru-Castor Oil (VENELEX) OINT Apply to sacrum and bilateral buttocks q shift and prn for prevention  . CARTIA XT  300 MG 24 hr capsule TAKE ONE CAPSULE BY MOUTH EVERY DAY  . donepezil (ARICEPT) 5 MG tablet Take 5 mg by mouth at bedtime.  . DULoxetine (CYMBALTA) 30 MG capsule Take 30 mg by mouth daily.  . Fluticasone-Umeclidin-Vilant (TRELEGY ELLIPTA) 100-62.5-25 MCG/INH AEPB One puff inhalation once a day   . Folic Acid 5 MG CAPS Take 5 mg by mouth every Sunday.  . furosemide (LASIX) 40 MG tablet Take 40 mg by mouth daily.  Marland Kitchen guaifenesin (ROBITUSSIN) 100 MG/5ML syrup Take 200 mg by mouth 4 (four) times daily as needed for cough.  . Menthol, Topical Analgesic, (BIOFREEZE) 4 % GEL Apply to both knees and bilateral shoulders daily as needed  . Menthol, Topical Analgesic, (BIOFREEZE) 4 % GEL Apply to left and right shoulder Once A Day  . methotrexate (RHEUMATREX) 2.5 MG tablet Take 12.5 mg by mouth once a week. On Sunday - Caution:Chemotherapy. Protect from light.  . Multiple Vitamin (DAILY VITE PO) Take 1 tablet by mouth daily.  . NON FORMULARY Diet Type:  NAS, Regular  Consistent Carbohydrates, Dysphagia 3  . OXYGEN Inhale 3.5 L/min into the lungs continuous.  . pantoprazole (PROTONIX) 40 MG tablet Take 40 mg by mouth 2 (two) times daily.  . phenylephrine-shark liver oil-mineral oil-petrolatum (PREPARATION H) 0.25-3-14-71.9 % rectal ointment Place 1 application rectally 2 (two) times daily as needed for hemorrhoids.  . polyethylene glycol (MIRALAX / GLYCOLAX) packet Take 17 g by mouth daily as needed for moderate constipation.  . potassium chloride SA (K-DUR,KLOR-CON) 20 MEQ tablet Take 2 tablets (40 mg ) by mouth twice a day  . pregabalin (LYRICA) 50 MG capsule Take 1 capsule by mouth  Twice daily  . Propylene Glycol (SYSTANE BALANCE) 0.6 % SOLN Place 1 drop into both eyes 2 (two) times daily.  . raloxifene (EVISTA) 60 MG tablet Take 60 mg by mouth daily.  . rosuvastatin (CRESTOR) 20 MG tablet Take 20 mg by mouth daily.  . Skin Protectants, Misc. (NO-STING SKIN-PREP EX) Apply externally to bilateral heels  every shift for prevention   No facility-administered encounter medications on file as of 01/07/2019.      SIGNIFICANT DIAGNOSTIC EXAMS  TODAY;   01-07-19: chest x-ray; COPD. Aortic atherosclerosis no acute pathology  LABS REVIEWED PREVIOUS :   11-08-17: Rheumatoid factor 29.6 (elevated) uric acid 3.6 03-01-18: wbc  6.3; hgb 14.3; hct 48.3; mcv 89.0 plt 183; glucose 107; bun 17; creat 1.26; k+ 4.3; na++ 139; ca 9.7 liver normal albumin 3.3  05-01-18: wbc 5.3 hgb 13.9; hct 46.5; mcv 91.2 plt 125 glucose 125; bun 12; creat 0.86; k+ 4.1; na++ 141; ca 9.7 liver normal albumin 3.4 chol 127 ldl 66; trig 108; hdl 39  06-01-18: hgb a1c 5.9; urine micro-albumin <3.0 09-26-18: wbc 8.3; hgb 16.2; hct 52.3 mcv 93.7; plt 156 11-02-18: glucose 117; bun 16; creat 1.02; k+ 4.2; na++ 142; ca 9.6 ;liver normal albumin 3.5; hgb a1c 5.8; urine micro-albumin 4.3    NO NEW LABS.   Review of Systems  Constitutional: Negative for malaise/fatigue.  Respiratory: Positive for cough. Negative for sputum production and shortness of breath.   Cardiovascular: Negative for chest pain, palpitations and leg swelling.  Gastrointestinal: Negative for abdominal pain, constipation and heartburn.  Musculoskeletal: Negative for back pain, joint pain and myalgias.  Skin: Negative.   Neurological: Negative for dizziness.  Psychiatric/Behavioral: The patient is not nervous/anxious.     Physical Exam Constitutional:      General: She is not in acute distress.    Appearance: She is well-developed. She is not diaphoretic.  Neck:     Musculoskeletal: Neck supple.     Thyroid: No thyromegaly.  Cardiovascular:     Rate and Rhythm: Normal rate and regular rhythm.     Pulses: Normal pulses.     Heart sounds: Normal heart sounds.  Pulmonary:     Effort: Pulmonary effort is normal. No respiratory distress.     Breath sounds: Wheezing and rhonchi present.     Comments: 02 dependent Scattered wheezed present Has rhonchi in  bases  Abdominal:     General: Bowel sounds are normal. There is no distension.     Palpations: Abdomen is soft.     Tenderness: There is no abdominal tenderness.  Musculoskeletal:     Right lower leg: No edema.     Left lower leg: No edema.     Comments:  Unable to move left upper extremity due to frozen left shoulder Able to move other extremities             Lymphadenopathy:     Cervical: No cervical adenopathy.  Skin:    General: Skin is warm and dry.     Comments: Bilateral lower extremities discolored    Neurological:     Mental Status: She is alert. Mental status is at baseline.  Psychiatric:        Mood and Affect: Mood normal.     ASSESSMENT/ PLAN:  TODAY;   1. Chronic respiratory failure with hypoxia 2. Chronic obstructive pulmonary disease unspecified copd type  Is worse Chest x-ray does not demonstrate pneumonia.  Will begin albuterol neb very 6 hours through 01-14-19 and will monitor her status.   MD is aware of resident's narcotic use and is in agreement with current plan of care. We will attempt to wean resident as appropriate.  Ok Edwards NP [facility]  Contact [phone] Monday through Friday 8am- 5pm  After hours call [phone]

## 2019-01-09 ENCOUNTER — Non-Acute Institutional Stay (SKILLED_NURSING_FACILITY): Payer: Medicare Other | Admitting: Internal Medicine

## 2019-01-09 ENCOUNTER — Encounter: Payer: Self-pay | Admitting: Internal Medicine

## 2019-01-09 DIAGNOSIS — I5032 Chronic diastolic (congestive) heart failure: Secondary | ICD-10-CM

## 2019-01-09 DIAGNOSIS — M0520 Rheumatoid vasculitis with rheumatoid arthritis of unspecified site: Secondary | ICD-10-CM | POA: Diagnosis not present

## 2019-01-09 DIAGNOSIS — I10 Essential (primary) hypertension: Secondary | ICD-10-CM | POA: Diagnosis not present

## 2019-01-09 NOTE — Progress Notes (Signed)
Location:  [place] Room Number: 152/W Place of Service:  SNF (31)  [name], MD  Patient Care Team: [name], MD as PCP - General (Internal Medicine) Nyoka Cowden Phylis Bougie, NP as Nurse Practitioner ([place]) [facility], [place] ([place])  Extended Emergency Contact Information Primary Emergency Contact: Nance,Sheila Address: [address]          [place], [place] [address] [place] Phone: [phone] Relation: Daughter Secondary Emergency Contact: Charlynne Pander, [place] [address] [place] Phone: [phone] Relation: Granddaughter    Allergies: Other, Tape, Lipitor [atorvastatin calcium], and Niaspan [niacin er]  Chief Complaint  Patient presents with  . Medical Management of Chronic Issues    Routine visit of medical managment    HPI: Patient is 85 y.o. female who is being seen for routine issues of chronic diastolic congestive heart failure, hypertension, and rheumatoid arthritis.  Past Medical History:  Diagnosis Date  . Anemia   . Atrial fibrillation ([place])   . Back pain   . Bacterial pneumonia   . Cancer (HCC)    Skin   . Chronic respiratory failure ([place])   . Cognitive communication deficit   . COPD (chronic obstructive pulmonary disease) ([place])   . Dementia ([place])   . Dementia ([place])   . Depression   . Diabetes mellitus without complication ([place])   . Diverticulosis   . Diverticulosis   . Fatty liver   . Fibromyalgia   . Fibromyalgia   . Gastric polyp 09/08/11   at the anastomosis-inflammatory  . Generalized anxiety disorder   . Generalized anxiety disorder   . GERD (gastroesophageal reflux disease)   . GERD (gastroesophageal reflux disease)   . 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 Hypercholesteremia   . Hypertension   . Insomnia   . Internal hemorrhoids   . Muscle weakness   . Neuropathy   . On home O2    qhs  .  Osteoporosis   . Rheumatoid arteritis ([place])   . Ulcer    stomach  . Vitamin D deficiency     Past Surgical History:  Procedure Laterality Date  . ABDOMINAL HYSTERECTOMY    . ABDOMINAL SURGERY     removed patial stomach from ulcer  . COLONOSCOPY  04-2001   internal hemorrhoids, panocolonic diverticulosis, and colon polyps   . UPPER GASTROINTESTINAL ENDOSCOPY      Outpatient Encounter Medications as of 01/09/2019  Medication Sig  . acetaminophen (TYLENOL) 325 MG tablet Take 650 mg by mouth every 8 (eight) hours as needed.  Marland Kitchen albuterol (ACCUNEB) 0.63 MG/3ML nebulizer solution Take 1 ampule by nebulization every 6 (six) hours.  Marland Kitchen apixaban (ELIQUIS) 2.5 MG TABS tablet Take 2.5 mg by mouth 2 (two) times daily.  Roseanne Kaufman Peru-Castor Oil (VENELEX) OINT Apply to sacrum and bilateral buttocks q shift and prn for prevention  . CARTIA XT 300 MG 24 hr capsule TAKE ONE CAPSULE BY MOUTH EVERY DAY  . donepezil (ARICEPT) 5 MG tablet Take 5 mg by mouth at bedtime.  . DULoxetine (CYMBALTA) 30 MG capsule Take 30 mg by mouth daily.  . Fluticasone-Umeclidin-Vilant (TRELEGY ELLIPTA) 100-62.5-25 MCG/INH AEPB One puff inhalation once a day   . Folic Acid 5 MG CAPS Take 5 mg by mouth every Sunday.  . furosemide (LASIX) 40 MG tablet Take 40 mg by mouth daily.  Marland Kitchen guaifenesin (ROBITUSSIN) 100 MG/5ML syrup Take  200 mg by mouth 4 (four) times daily as needed for cough.  . Menthol, Topical Analgesic, (BIOFREEZE) 4 % GEL Apply to both knees  daily as needed  . Menthol, Topical Analgesic, (BIOFREEZE) 4 % GEL Apply to left and right shoulder Once A Day  . methotrexate (RHEUMATREX) 2.5 MG tablet Take 12.5 mg by mouth once a week. On Sunday - Caution:Chemotherapy. Protect from light.  . Multiple Vitamin (DAILY VITE PO) Take 1 tablet by mouth daily.  . NON FORMULARY Diet Type:  NAS, Regular  Consistent Carbohydrates, Dysphagia 3  . OXYGEN Inhale 3.5 L/min into the lungs continuous.  . pantoprazole (PROTONIX) 40 MG  tablet Take 40 mg by mouth 2 (two) times daily.  . phenylephrine-shark liver oil-mineral oil-petrolatum (PREPARATION H) 0.25-3-14-71.9 % rectal ointment Place 1 application rectally 2 (two) times daily as needed for hemorrhoids.  . polyethylene glycol (MIRALAX / GLYCOLAX) packet Take 17 g by mouth daily as needed for moderate constipation.  . potassium chloride SA (K-DUR,KLOR-CON) 20 MEQ tablet Take 2 tablets (40 mg ) by mouth twice a day  . pregabalin (LYRICA) 50 MG capsule Take 1 capsule by mouth  Twice daily  . Propylene Glycol (SYSTANE BALANCE) 0.6 % SOLN Place 1 drop into both eyes 2 (two) times daily.  . raloxifene (EVISTA) 60 MG tablet Take 60 mg by mouth daily.  . rosuvastatin (CRESTOR) 20 MG tablet Take 20 mg by mouth daily.  . Skin Protectants, Misc. (NO-STING SKIN-PREP EX) Apply externally to bilateral heels every shift for prevention   No facility-administered encounter medications on file as of 01/09/2019.     No orders of the defined types were placed in this encounter.   Immunization History  Administered Date(s) Administered  . Influenza-Unspecified 12/17/2018  . Tdap 10/21/2016    Social History   Tobacco Use  . Smoking status: Never Smoker  . Smokeless tobacco: Never Used  Substance Use Topics  . Alcohol use: No    Review of Systems  GENERAL:  no fevers, fatigue, appetite changes SKIN: No itching, rash HEENT: No complaint RESPIRATORY: No cough, wheezing, SOB CARDIAC: No chest pain, palpitations, lower extremity edema  GI: No abdominal pain, No N/V/D or constipation, No heartburn or reflux  GU: No dysuria, frequency or urgency, or incontinence  MUSCULOSKELETAL: No unrelieved bone/joint pain NEUROLOGIC: No headache, dizziness  PSYCHIATRIC: No overt anxiety or sadness  Vitals:   01/09/19 1110  BP: (!) 152/75  Pulse: 69  Resp: 20  Temp: 97.7 F (36.5 C)  SpO2: 99%   Body mass index is 35.39 kg/m. Physical Exam  GENERAL APPEARANCE: Alert,  conversant, No acute distress  SKIN: No diaphoresis rash HEENT: Unremarkable RESPIRATORY: Breathing is even, unlabored. Lung sounds are clear   CARDIOVASCULAR: Heart RRR no murmurs, rubs or gallops. No peripheral edema  GASTROINTESTINAL: Abdomen is soft, non-tender, not distended w/ normal bowel sounds.  GENITOURINARY: Bladder non tender, not distended  MUSCULOSKELETAL: No abnormal joints or musculature NEUROLOGIC: Cranial nerves 2-12 grossly intact. Moves all extremities PSYCHIATRIC: Mood and affect with moderate dementia, no behavioral issues  Patient Active Problem List   Diagnosis Date Noted  . Major depression, recurrent, chronic ([place]) 11/19/2018  . Dyslipidemia associated with type 2 diabetes mellitus ([place]) 05/03/2018  . Diabetic peripheral neuropathy associated with type 2 diabetes mellitus ([place]) 05/03/2018  . Vascular dementia without behavioral disturbance ([place]) 05/03/2018  . Post-menopausal osteoporosis 05/03/2018  . Chronic constipation 05/03/2018  . Left shoulder pain 07/25/2017  . Chronic diastolic heart failure ([place])  07/03/2017  . Type 2 diabetes, controlled, with peripheral neuropathy ([place]) 04/20/2017  . Closed compression fracture of L1 lumbar vertebra 04/10/2017  . CKD stage 3 due to type 2 diabetes mellitus ([place]) 04/01/2017  . Depression 03/31/2017  . Peripheral neuropathy 08/01/2016  . Hypertension 07/26/2016  . Prolonged QT interval 07/26/2016  . Chronic respiratory failure with hypoxia ([place]) 07/21/2013  . Hypokalemia 07/21/2013  . Atrial fibrillation with RVR ([place]) 07/21/2013  . Fibromyalgia   . COPD (chronic obstructive pulmonary disease) ([place])   . GERD (gastroesophageal reflux disease)   . Rheumatoid arteritis ([place])   . Back pain   . Fracture of distal fibula 09/20/2010    CMP     Component Value Date/Time   NA 142 11/02/2018 0823   K 4.2 11/02/2018 0823   CL 106 11/02/2018 0823   CO2 27 11/02/2018 0823   GLUCOSE 117 (H) 11/02/2018 0823   BUN 16  11/02/2018 0823   CREATININE 1.02 (H) 11/02/2018 0823   CREATININE 1.14 (H) 09/11/2013 1016   CALCIUM 9.6 11/02/2018 0823   PROT 6.3 (L) 11/02/2018 0823   ALBUMIN 3.5 11/02/2018 0823   AST 25 11/02/2018 0823   ALT 22 11/02/2018 0823   ALKPHOS 75 11/02/2018 0823   BILITOT 0.6 11/02/2018 0823   GFRNONAA 49 (L) 11/02/2018 0823   GFRAA 57 (L) 11/02/2018 0823   Recent Labs    03/01/18 0754 05/01/18 0700 11/02/18 0823  NA 139 141 142  K 4.3 4.1 4.2  CL 105 107 106  CO2 28 27 27   GLUCOSE 107* 125* 117*  BUN 17 12 16   CREATININE 1.26* 0.86 1.02*  CALCIUM 9.7 9.7 9.6   Recent Labs    03/01/18 0754 05/01/18 0700 11/02/18 0823  AST 24 32 25  ALT 25 23 22   ALKPHOS 69 64 75  BILITOT 0.8 0.7 0.6  PROT 6.4* 6.1* 6.3*  ALBUMIN 3.3* 3.4* 3.5   Recent Labs    03/01/18 0754 05/01/18 0700 09/26/18 0900  WBC 6.3 5.3 8.3  NEUTROABS 3.5 2.9 4.3  HGB 14.3 13.9 16.2*  HCT 48.3* 46.5* 52.3*  MCV 89.0 91.2 93.7  PLT 185 125* 156   Recent Labs    05/01/18 0700  CHOL 127  LDLCALC 66  TRIG 108   Lab Results  Component Value Date   MICROALBUR 4.3 (H) 11/02/2018   Lab Results  Component Value Date   TSH 2.992 07/26/2016   Lab Results  Component Value Date   HGBA1C 5.8 (H) 11/02/2018   Lab Results  Component Value Date   CHOL 127 05/01/2018   HDL 39 (L) 05/01/2018   LDLCALC 66 05/01/2018   TRIG 108 05/01/2018   CHOLHDL 3.3 05/01/2018    Significant Diagnostic Results in last 30 days:  No results found.  Assessment and Plan  Chronic diastolic heart failure (HCC) No recent reported exacerbation; continue Lasix 40 mg daily  Hypertension Controlled; continue Cartia 300 mg daily and Lasix 40 mg daily  Rheumatoid arteritis Chronic and stable; continue methotrexate 12.5 mg weekly    [name], MD

## 2019-01-13 ENCOUNTER — Encounter: Payer: Self-pay | Admitting: Internal Medicine

## 2019-01-13 NOTE — Assessment & Plan Note (Signed)
Controlled; continue Cartia 300 mg daily and Lasix 40 mg daily

## 2019-01-13 NOTE — Assessment & Plan Note (Signed)
No recent reported exacerbation; continue Lasix 40 mg daily

## 2019-01-13 NOTE — Assessment & Plan Note (Signed)
Chronic and stable; continue methotrexate 12.5 mg weekly

## 2019-01-21 DIAGNOSIS — Z7984 Long term (current) use of oral hypoglycemic drugs: Secondary | ICD-10-CM | POA: Diagnosis not present

## 2019-01-21 DIAGNOSIS — E113293 Type 2 diabetes mellitus with mild nonproliferative diabetic retinopathy without macular edema, bilateral: Secondary | ICD-10-CM | POA: Diagnosis not present

## 2019-01-21 DIAGNOSIS — H04123 Dry eye syndrome of bilateral lacrimal glands: Secondary | ICD-10-CM | POA: Diagnosis not present

## 2019-01-21 DIAGNOSIS — Z794 Long term (current) use of insulin: Secondary | ICD-10-CM | POA: Diagnosis not present

## 2019-01-22 ENCOUNTER — Encounter: Payer: Self-pay | Admitting: Adult Health

## 2019-01-22 ENCOUNTER — Non-Acute Institutional Stay (SKILLED_NURSING_FACILITY): Payer: Medicare Other | Admitting: Adult Health

## 2019-01-22 DIAGNOSIS — M810 Age-related osteoporosis without current pathological fracture: Secondary | ICD-10-CM | POA: Diagnosis not present

## 2019-01-22 NOTE — Progress Notes (Signed)
Location:    [place] Room Number: 152/W Place of Service:  SNF (31)   CODE STATUS: DNR  Allergies  Allergen Reactions  . Other     Band Aids   . Tape   . Lipitor [Atorvastatin Calcium] Rash and Other (See Comments)    Muscle pain  . Niaspan [Niacin Er] Rash and Other (See Comments)    Muscle pain    Chief Complaint  Patient presents with  . Acute Visit    Osteoporosis    HPI:  Her t score is worse at -4.997. there are no reports of recent falls. No recent fractures. She has been on evista. This medication has not been effective for her. There are no reports of uncontrolled pain; no coughing no shortness of breath.   Past Medical History:  Diagnosis Date  . Anemia   . Atrial fibrillation ([place])   . Back pain   . Bacterial pneumonia   . Cancer (HCC)    Skin   . Chronic respiratory failure ([place])   . Cognitive communication deficit   . COPD (chronic obstructive pulmonary disease) ([place])   . Dementia ([place])   . Dementia ([place])   . Depression   . Diabetes mellitus without complication ([place])   . Diverticulosis   . Diverticulosis   . Fatty liver   . Fibromyalgia   . Fibromyalgia   . Gastric polyp 09/08/11   at the anastomosis-inflammatory  . Generalized anxiety disorder   . Generalized anxiety disorder   . GERD (gastroesophageal reflux disease)   . GERD (gastroesophageal reflux disease)   . 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 Hypercholesteremia   . Hypertension   . Insomnia   . Internal hemorrhoids   . Muscle weakness   . Neuropathy   . On home O2    qhs  . Osteoporosis   . Rheumatoid arteritis ([place])   . Ulcer    stomach  . Vitamin D deficiency     Past Surgical History:  Procedure Laterality Date  . ABDOMINAL HYSTERECTOMY    . ABDOMINAL SURGERY     removed patial stomach from ulcer  . COLONOSCOPY  04-2001   internal hemorrhoids, panocolonic diverticulosis, and colon polyps   . UPPER GASTROINTESTINAL ENDOSCOPY       Social History   Socioeconomic History  . Marital status: Widowed    Spouse name: Not on file  . Number of children: 6  . Years of education: Not on file  . Highest education level: Not on file  Occupational History  . Occupation: Retired  Scientific laboratory technician  . Financial resource strain: Not hard at all  . Food insecurity    Worry: Never true    Inability: Never true  . Transportation needs    Medical: No    Non-medical: No  Tobacco Use  . Smoking status: Never Smoker  . Smokeless tobacco: Never Used  Substance and Sexual Activity  . Alcohol use: No  . Drug use: No  . Sexual activity: Not on file  Lifestyle  . Physical activity    Days per week: 0 days    Minutes per session: 0 min  . Stress: Not at all  Relationships  . Social Herbalist on phone: Three times a week    Gets together: Three times a week    Attends religious service: Never    Active member of club or organization: No    Attends meetings of clubs or organizations:  Never    Relationship status: Widowed  . Intimate partner violence    Fear of current or ex partner: No    Emotionally abused: No    Physically abused: No    Forced sexual activity: No  Other Topics Concern  . Not on file  Social History Narrative   Daily caffeine    Family History  Problem Relation Age of Onset  . Heart disease Father   . Kidney disease Mother        kidney cancer  . Colon cancer Daughter 109      VITAL SIGNS BP 127/63   Pulse 80   Temp 98.3 F (36.8 C) (Oral)   Resp 20   Ht 5\' 3"  (1.6 m)   Wt 202 lb (91.6 kg)   SpO2 99%   BMI 35.78 kg/m   Outpatient Encounter Medications as of 01/22/2019  Medication Sig  . acetaminophen (TYLENOL) 325 MG tablet Take 650 mg by mouth every 8 (eight) hours as needed.  Marland Kitchen apixaban (ELIQUIS) 2.5 MG TABS tablet Take 2.5 mg by mouth 2 (two) times daily.  Roseanne Kaufman Peru-Castor Oil (VENELEX) OINT Apply to sacrum and bilateral buttocks q shift and prn for prevention  .  CARTIA XT 300 MG 24 hr capsule TAKE ONE CAPSULE BY MOUTH EVERY DAY  . denosumab (PROLIA) 60 MG/ML SOSY injection Inject 60 mg into the skin every 6 (six) months.  . donepezil (ARICEPT) 5 MG tablet Take 5 mg by mouth at bedtime.  . DULoxetine (CYMBALTA) 30 MG capsule Take 30 mg by mouth daily.  . Fluticasone-Umeclidin-Vilant (TRELEGY ELLIPTA) 100-62.5-25 MCG/INH AEPB One puff inhalation once a day   . Folic Acid 5 MG CAPS Take 5 mg by mouth every Sunday.  . furosemide (LASIX) 40 MG tablet Take 40 mg by mouth daily.  Marland Kitchen guaifenesin (ROBITUSSIN) 100 MG/5ML syrup Take 200 mg by mouth 4 (four) times daily as needed for cough.  . Menthol, Topical Analgesic, (BIOFREEZE) 4 % GEL Apply to both knees  daily as needed  . Menthol, Topical Analgesic, (BIOFREEZE) 4 % GEL Apply to left and right shoulder Once A Day  . methotrexate (RHEUMATREX) 2.5 MG tablet Take 12.5 mg by mouth once a week. On Sunday - Caution:Chemotherapy. Protect from light.  . Multiple Vitamin (DAILY VITE PO) Take 1 tablet by mouth daily.  . NON FORMULARY Diet Type:  NAS, Regular  Consistent Carbohydrates, Dysphagia 3  . OXYGEN Inhale 3.5 L/min into the lungs continuous.  . pantoprazole (PROTONIX) 40 MG tablet Take 40 mg by mouth 2 (two) times daily.  . phenylephrine-shark liver oil-mineral oil-petrolatum (PREPARATION H) 0.25-3-14-71.9 % rectal ointment Place 1 application rectally 2 (two) times daily as needed for hemorrhoids.  . polyethylene glycol (MIRALAX / GLYCOLAX) packet Take 17 g by mouth daily as needed for moderate constipation.  . potassium chloride SA (K-DUR,KLOR-CON) 20 MEQ tablet Take 2 tablets (40 mg ) by mouth twice a day  . pregabalin (LYRICA) 50 MG capsule Take 1 capsule by mouth  Twice daily  . Propylene Glycol (SYSTANE BALANCE) 0.6 % SOLN Place 1 drop into both eyes 2 (two) times daily.  . rosuvastatin (CRESTOR) 20 MG tablet Take 20 mg by mouth daily.  . Skin Protectants, Misc. (NO-STING SKIN-PREP EX) Apply externally to  bilateral heels every shift for prevention  . [DISCONTINUED] albuterol (ACCUNEB) 0.63 MG/3ML nebulizer solution Take 1 ampule by nebulization every 6 (six) hours.  . [DISCONTINUED] raloxifene (EVISTA) 60 MG tablet Take 60 mg by  mouth daily.   No facility-administered encounter medications on file as of 01/22/2019.      SIGNIFICANT DIAGNOSTIC EXAMS  PREVIOUS;   01-07-19: chest x-ray; COPD. Aortic atherosclerosis no acute pathology   TODAY'   01-20-19: DEXA: t score -4.997   LABS REVIEWED PREVIOUS :   11-08-17: Rheumatoid factor 29.6 (elevated) uric acid 3.6 03-01-18: wbc  6.3; hgb 14.3; hct 48.3; mcv 89.0 plt 183; glucose 107; bun 17; creat 1.26; k+ 4.3; na++ 139; ca 9.7 liver normal albumin 3.3  05-01-18: wbc 5.3 hgb 13.9; hct 46.5; mcv 91.2 plt 125 glucose 125; bun 12; creat 0.86; k+ 4.1; na++ 141; ca 9.7 liver normal albumin 3.4 chol 127 ldl 66; trig 108; hdl 39  06-01-18: hgb a1c 5.9; urine micro-albumin <3.0 09-26-18: wbc 8.3; hgb 16.2; hct 52.3 mcv 93.7; plt 156 11-02-18: glucose 117; bun 16; creat 1.02; k+ 4.2; na++ 142; ca 9.6 ;liver normal albumin 3.5; hgb a1c 5.8; urine micro-albumin 4.3    NO NEW LABS.   Review of Systems  Constitutional: Negative for malaise/fatigue.  Respiratory: Negative for cough and shortness of breath.   Cardiovascular: Negative for chest pain, palpitations and leg swelling.  Gastrointestinal: Negative for abdominal pain, constipation and heartburn.  Musculoskeletal: Negative for back pain, joint pain and myalgias.  Skin: Negative.   Neurological: Negative for dizziness.  Psychiatric/Behavioral: The patient is not nervous/anxious.      Physical Exam Constitutional:      General: She is not in acute distress.    Appearance: She is well-developed. She is not diaphoretic.  Neck:     Musculoskeletal: Neck supple.     Thyroid: No thyromegaly.  Cardiovascular:     Rate and Rhythm: Normal rate and regular rhythm.     Pulses: Normal pulses.      Heart sounds: Normal heart sounds.  Pulmonary:     Effort: Pulmonary effort is normal. No respiratory distress.     Breath sounds: Normal breath sounds.     Comments: 02 dependent  Abdominal:     General: Bowel sounds are normal. There is no distension.     Palpations: Abdomen is soft.     Tenderness: There is no abdominal tenderness.  Musculoskeletal:     Right lower leg: No edema.     Left lower leg: No edema.     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  Skin:    General: Skin is warm and dry.     Comments: Bilateral lower extremities discolored   Neurological:     Mental Status: She is alert. Mental status is at baseline.  Psychiatric:        Mood and Affect: Mood normal.      ASSESSMENT/ PLAN:  TODAY;   1. Postmenopausal osteoporosis: is worse; t score -4.997 will stop evista and will begin prolia 60 mg every 6 months and will monitor      MD is aware of resident's narcotic use and is in agreement with current plan of care. We will attempt to wean resident as appropriate.  Ok Edwards NP [facility]  Contact [phone] Monday through Friday 8am- 5pm  After hours call [phone]

## 2019-01-28 ENCOUNTER — Other Ambulatory Visit: Payer: Self-pay | Admitting: Adult Health

## 2019-01-28 MED ORDER — PREGABALIN 50 MG PO CAPS: ORAL_CAPSULE | ORAL | 0 refills | Status: DC

## 2019-02-01 ENCOUNTER — Encounter: Payer: Self-pay | Admitting: Adult Health

## 2019-02-01 ENCOUNTER — Non-Acute Institutional Stay: Payer: Self-pay | Admitting: Adult Health

## 2019-02-01 DIAGNOSIS — M0520 Rheumatoid vasculitis with rheumatoid arthritis of unspecified site: Secondary | ICD-10-CM

## 2019-02-01 DIAGNOSIS — J9611 Chronic respiratory failure with hypoxia: Secondary | ICD-10-CM

## 2019-02-01 DIAGNOSIS — Z0000 Encounter for general adult medical examination without abnormal findings: Secondary | ICD-10-CM

## 2019-02-01 DIAGNOSIS — F0150 Vascular dementia without behavioral disturbance: Secondary | ICD-10-CM

## 2019-02-01 NOTE — Progress Notes (Signed)
Location:  [facility] Room Number: 152/W Place of Service:  SNF [phone]) Provider: Ok Edwards NP   Patient Care Team: [name], MD as PCP - General (Internal Medicine) Nyoka Cowden Phylis Bougie, NP as Nurse Practitioner ([place]) Center, [place] ([place])  Extended Emergency Contact Information Primary Emergency Contact: Nance,Sheila Address: [address]          [place], [place] [address] [place] Phone: [phone] Relation: Daughter Secondary Emergency Contact: Charlynne Pander, [place] [address] [place] Phone: [phone] Relation: Granddaughter  Code Status: dnr Goals of Care: Advanced Directive information Advanced Directives 02/01/2019  Does Patient Have a Medical Advance Directive? Yes  Type of Advance Directive Out of facility DNR (pink MOST or yellow form)  Does patient want to make changes to medical advance directive? No - Patient declined  Copy of [place] in Chart? -  Would patient like information on creating a medical advance directive? -  Pre-existing out of facility DNR order (yellow form or pink MOST form) -     Chief Complaint  Patient presents with  . Medicare Wellness    Annual Wellness Visit    HPI: Patient is a 85 y.o. female seen in today for an annual wellness exam.  She has not had any recnet falls. Her weight is without significant change. She has not been hospitalized over the past year. She is 02 dependent. She denies any uncontrolled pain. She denies nay changes in her appetite. There are no reports of agitation or insomnia. She continues to be followed for her chronic illnesses including: respiratory failure; rheumatoid arteritis; dementia.   Depression screen [facility] 2/9 02/01/2019 08/15/2017  Decreased Interest 0 0  Down, Depressed, Hopeless 1 0  PHQ - 2 Score 1 0    Fall Risk  02/01/2019 10/10/2018 08/15/2017   Falls in the past year? 0 (No Data) No  Comment - Emmi Telephone Survey: data to providers prior to load -  Number falls in past yr: 0 (No Data) -  Comment - Emmi Telephone Survey Actual Response =  -    Health Maintenance  Topic Date Due  . OPHTHALMOLOGY EXAM  03/28/2019 (Originally 01/09/2019)  . HEMOGLOBIN A1C  05/05/2019  . FOOT EXAM  05/31/2019  . URINE MICROALBUMIN  11/02/2019  . TETANUS/TDAP  10/22/2026  . INFLUENZA VACCINE  Completed  . DEXA SCAN  Discontinued  . PNA vac Low Risk Adult  Discontinued    Is the patient deaf or have difficulty hearing?: Yes Does the patient have difficulty seeing, even when wearing glasses/contacts?: No Does the patient have difficulty concentrating, remembering, or making decisions?: Yes Does the patient have difficulty walking or climbing stairs?: Yes Does the patient have difficulty dressing or bathing?: Yes   Past Medical History:  Diagnosis Date  . Anemia   . Atrial fibrillation ([place])   . Back pain   . Bacterial pneumonia   . Cancer (HCC)    Skin   . Chronic respiratory failure ([place])   . Cognitive communication deficit   . COPD (chronic obstructive pulmonary disease) ([place])   . Dementia ([place])   . Dementia ([place])   . Depression   . Diabetes mellitus without complication ([place])   . Diverticulosis   . Diverticulosis   . Fatty liver   . Fibromyalgia   . Fibromyalgia   . Gastric polyp  09/08/11   at the anastomosis-inflammatory  . Generalized anxiety disorder   . Generalized anxiety disorder   . GERD (gastroesophageal reflux disease)   . GERD (gastroesophageal reflux disease)   . 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 Hypercholesteremia   . Hypertension   . Insomnia   . Internal hemorrhoids   . Muscle weakness   . Neuropathy   . On home O2    qhs  . Osteoporosis   . Rheumatoid arteritis ([place])   . Ulcer    stomach  . Vitamin D deficiency     Past Surgical History:  Procedure Laterality Date  . ABDOMINAL  HYSTERECTOMY    . ABDOMINAL SURGERY     removed patial stomach from ulcer  . COLONOSCOPY  04-2001   internal hemorrhoids, panocolonic diverticulosis, and colon polyps   . UPPER GASTROINTESTINAL ENDOSCOPY      Family History  Problem Relation Age of Onset  . Heart disease Father   . Kidney disease Mother        kidney cancer  . Colon cancer Daughter 31    Social History   Socioeconomic History  . Marital status: Widowed    Spouse name: Not on file  . Number of children: 6  . Years of education: Not on file  . Highest education level: Not on file  Occupational History  . Occupation: Retired  Scientific laboratory technician  . Financial resource strain: Not hard at all  . Food insecurity    Worry: Never true    Inability: Never true  . Transportation needs    Medical: No    Non-medical: No  Tobacco Use  . Smoking status: Never Smoker  . Smokeless tobacco: Never Used  Substance and Sexual Activity  . Alcohol use: No  . Drug use: No  . Sexual activity: Not Currently  Lifestyle  . Physical activity    Days per week: 0 days    Minutes per session: 0 min  . Stress: Not at all  Relationships  . Social Herbalist on phone: Never    Gets together: Never    Attends religious service: Never    Active member of club or organization: No    Attends meetings of clubs or organizations: Never    Relationship status: Widowed  Other Topics Concern  . Not on file  Social History Narrative   Long term resident of [facility]     reports that she has never smoked. She has never used smokeless tobacco. She reports that she does not drink alcohol or use drugs.   Allergies  Allergen Reactions  . Other     Band Aids   . Tape   . Lipitor [Atorvastatin Calcium] Rash and Other (See Comments)    Muscle pain  . Niaspan [Niacin Er] Rash and Other (See Comments)    Muscle pain    Outpatient Encounter Medications as of 02/01/2019  Medication Sig  . acetaminophen (TYLENOL) 325 MG tablet Take 650  mg by mouth every 8 (eight) hours as needed.  Marland Kitchen apixaban (ELIQUIS) 2.5 MG TABS tablet Take 2.5 mg by mouth 2 (two) times daily.  Roseanne Kaufman [place]-Castor Oil (VENELEX) OINT Apply to sacrum and bilateral buttocks q shift and prn for prevention  . CARTIA XT 300 MG 24 hr capsule TAKE ONE CAPSULE BY MOUTH EVERY DAY  . denosumab (PROLIA) 60 MG/ML SOSY injection Inject 60 mg into the skin every 6 (six) months.  . donepezil (ARICEPT) 5 MG tablet Take  5 mg by mouth at bedtime.  . DULoxetine (CYMBALTA) 30 MG capsule Take 30 mg by mouth daily.  . Fluticasone-Umeclidin-Vilant (TRELEGY ELLIPTA) 100-62.5-25 MCG/INH AEPB One puff inhalation once a day   . Folic Acid 5 MG CAPS Take 5 mg by mouth every Sunday.  . furosemide (LASIX) 40 MG tablet Take 40 mg by mouth daily.  Marland Kitchen guaifenesin (ROBITUSSIN) 100 MG/5ML syrup Take 200 mg by mouth 4 (four) times daily as needed for cough.  . Menthol, Topical Analgesic, (BIOFREEZE) 4 % GEL Apply to both knees  daily as needed  . Menthol, Topical Analgesic, (BIOFREEZE) 4 % GEL Apply to left and right shoulder Once A Day  . methotrexate (RHEUMATREX) 2.5 MG tablet Take 12.5 mg by mouth once a week. On Sunday - Caution:Chemotherapy. Protect from light.  . Multiple Vitamin (DAILY VITE PO) Take 1 tablet by mouth daily.  . NON FORMULARY Diet Type:  NAS, Regular  Consistent Carbohydrates, Dysphagia 3  . OXYGEN Inhale 3.5 L/min into the lungs continuous.  . pantoprazole (PROTONIX) 40 MG tablet Take 40 mg by mouth 2 (two) times daily.  . phenylephrine-shark liver oil-mineral oil-petrolatum (PREPARATION H) 0.25-3-14-71.9 % rectal ointment Place 1 application rectally 2 (two) times daily as needed for hemorrhoids.  . polyethylene glycol (MIRALAX / GLYCOLAX) packet Take 17 g by mouth daily as needed for moderate constipation.  . potassium chloride SA (K-DUR,KLOR-CON) 20 MEQ tablet Take 2 tablets (40 mg ) by mouth twice a day  . pregabalin (LYRICA) 50 MG capsule Take 1 capsule by mouth   Twice daily  . Propylene Glycol (SYSTANE BALANCE) 0.6 % SOLN Place 1 drop into both eyes 2 (two) times daily.  . rosuvastatin (CRESTOR) 20 MG tablet Take 20 mg by mouth daily.  . Skin Protectants, Misc. (NO-STING SKIN-PREP EX) Apply externally to bilateral heels every shift for prevention   No facility-administered encounter medications on file as of 02/01/2019.      Review of Systems:  Review of Systems  Constitutional: Negative for appetite change and fatigue.  HENT: Negative for congestion.   Respiratory: Negative for cough, chest tightness and shortness of breath.   Cardiovascular: Negative for chest pain, palpitations and leg swelling.  Gastrointestinal: Negative for abdominal pain, constipation, diarrhea and nausea.  Musculoskeletal: Negative for arthralgias and myalgias.  Skin: Negative for pallor.  Neurological: Negative for dizziness.  Psychiatric/Behavioral: The patient is not nervous/anxious.     Physical Exam: Vitals:   02/01/19 1402  BP: 125/67  Pulse: 69  Resp: 20  Temp: (!) 97 F (36.1 C)  TempSrc: Oral  SpO2: 98%  Weight: 202 lb (91.6 kg)  Height: 5\' 3"  (1.6 m)   Body mass index is 35.78 kg/m. Physical Exam Constitutional:      General: She is not in acute distress.    Appearance: She is well-developed. She is not diaphoretic.  Neck:     Musculoskeletal: Neck supple.     Thyroid: No thyromegaly.  Cardiovascular:     Rate and Rhythm: Normal rate and regular rhythm.     Pulses: Normal pulses.     Heart sounds: Normal heart sounds.  Pulmonary:     Effort: Pulmonary effort is normal. No respiratory distress.     Breath sounds: Normal breath sounds.     Comments: 02 dependent  Abdominal:     General: Bowel sounds are normal. There is no distension.     Palpations: Abdomen is soft.     Tenderness: There is no abdominal tenderness.  Musculoskeletal:     Right lower leg: No edema.     Left lower leg: No edema.     Comments: Unable to move left upper  extremity due to frozen left shoulder Able to move other extremities               Lymphadenopathy:     Cervical: No cervical adenopathy.  Skin:    General: Skin is warm and dry.     Comments: Bilateral lower extremities discolored    Neurological:     Mental Status: She is alert. Mental status is at baseline.  Psychiatric:        Mood and Affect: Mood normal.       SIGNIFICANT DIAGNOSTIC EXAMS  PREVIOUS;   01-07-19: chest x-ray; COPD. Aortic atherosclerosis no acute pathology   01-20-19: DEXA: t score -4.997  NO NEW EXAMS.    LABS REVIEWED PREVIOUS :   03-01-18: wbc  6.3; hgb 14.3; hct 48.3; mcv 89.0 plt 183; glucose 107; bun 17; creat 1.26; k+ 4.3; na++ 139; ca 9.7 liver normal albumin 3.3  05-01-18: wbc 5.3 hgb 13.9; hct 46.5; mcv 91.2 plt 125 glucose 125; bun 12; creat 0.86; k+ 4.1; na++ 141; ca 9.7 liver normal albumin 3.4 chol 127 ldl 66; trig 108; hdl 39  06-01-18: hgb a1c 5.9; urine micro-albumin <3.0 09-26-18: wbc 8.3; hgb 16.2; hct 52.3 mcv 93.7; plt 156 11-02-18: glucose 117; bun 16; creat 1.02; k+ 4.2; na++ 142; ca 9.6 ;liver normal albumin 3.5; hgb a1c 5.8; urine micro-albumin 4.3    NO NEW LABS.    Assessment/Plan  TODAY;   1. Rheumatoid arteritis 2. Vascular dementia without behavioral disturbance 3. Chronic respiratory failure with hypoxia  4. Medicare annual wellness visit subsequent  Will continue current medication Will continue current plan of care Will continue to monitor her status.    Ok Edwards NP [facility]  Contact [phone] Monday through Friday 8am- 5pm  After hours call [phone]

## 2019-02-05 ENCOUNTER — Non-Acute Institutional Stay (SKILLED_NURSING_FACILITY): Payer: Medicare Other | Admitting: Adult Health

## 2019-02-05 ENCOUNTER — Encounter: Payer: Self-pay | Admitting: Adult Health

## 2019-02-05 DIAGNOSIS — K5909 Other constipation: Secondary | ICD-10-CM

## 2019-02-05 DIAGNOSIS — M810 Age-related osteoporosis without current pathological fracture: Secondary | ICD-10-CM

## 2019-02-05 DIAGNOSIS — E1142 Type 2 diabetes mellitus with diabetic polyneuropathy: Secondary | ICD-10-CM

## 2019-02-05 NOTE — Progress Notes (Signed)
Location:    [place] Room Number: 106/D Place of Service:  SNF (31)   CODE STATUS: DNR  Allergies  Allergen Reactions  . Other     Band Aids   . Tape   . Lipitor [Atorvastatin Calcium] Rash and Other (See Comments)    Muscle pain  . Niaspan [Niacin Er] Rash and Other (See Comments)    Muscle pain    Chief Complaint  Patient presents with  . Medical Management of Chronic Issues       Post-menopausal osteoporosis   Chronic constipation:  Type 2 diabetes controlled with peripheral neuropathy:    HPI:  She is a 85 year old long term resident of this facility being seen for the management of her chronic illnesses: osteoporosis; constipation; diabetes. She denies any uncontrolled pain; no changes in her appetite; is sleeping well at night; no anxiety.   Past Medical History:  Diagnosis Date  . Anemia   . Atrial fibrillation ([place])   . Back pain   . Bacterial pneumonia   . Cancer (HCC)    Skin   . Chronic respiratory failure ([place])   . Cognitive communication deficit   . COPD (chronic obstructive pulmonary disease) ([place])   . Dementia ([place])   . Dementia ([place])   . Depression   . Diabetes mellitus without complication ([place])   . Diverticulosis   . Diverticulosis   . Fatty liver   . Fibromyalgia   . Fibromyalgia   . Gastric polyp 09/08/11   at the anastomosis-inflammatory  . Generalized anxiety disorder   . Generalized anxiety disorder   . GERD (gastroesophageal reflux disease)   . GERD (gastroesophageal reflux disease)   . 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 Hypercholesteremia   . Hypertension   . Insomnia   . Internal hemorrhoids   . Muscle weakness   . Neuropathy   . On home O2    qhs  . Osteoporosis   . Rheumatoid arteritis ([place])   . Ulcer    stomach  . Vitamin D deficiency     Past Surgical History:  Procedure Laterality Date  . ABDOMINAL HYSTERECTOMY    . ABDOMINAL SURGERY     removed patial stomach from ulcer  .  COLONOSCOPY  04-2001   internal hemorrhoids, panocolonic diverticulosis, and colon polyps   . UPPER GASTROINTESTINAL ENDOSCOPY      Social History   Socioeconomic History  . Marital status: Widowed    Spouse name: Not on file  . Number of children: 6  . Years of education: Not on file  . Highest education level: Not on file  Occupational History  . Occupation: Retired  Scientific laboratory technician  . Financial resource strain: Not hard at all  . Food insecurity    Worry: Never true    Inability: Never true  . Transportation needs    Medical: No    Non-medical: No  Tobacco Use  . Smoking status: Never Smoker  . Smokeless tobacco: Never Used  Substance and Sexual Activity  . Alcohol use: No  . Drug use: No  . Sexual activity: Not Currently  Lifestyle  . Physical activity    Days per week: 0 days    Minutes per session: 0 min  . Stress: Not at all  Relationships  . Social Herbalist on phone: Never    Gets together: Never    Attends religious service: Never    Active member of club or organization:  No    Attends meetings of clubs or organizations: Never    Relationship status: Widowed  . Intimate partner violence    Fear of current or ex partner: No    Emotionally abused: No    Physically abused: No    Forced sexual activity: No  Other Topics Concern  . Not on file  Social History Narrative   Long term resident of [facility] [place]    Family History  Problem Relation Age of Onset  . Heart disease Father   . Kidney disease Mother        kidney cancer  . Colon cancer Daughter 79      VITAL SIGNS BP (!) 147/67   Pulse 77   Temp 97.7 F (36.5 C) (Oral)   Resp 20   Ht 5\' 3"  (1.6 m)   Wt 202 lb (91.6 kg)   SpO2 99%   BMI 35.78 kg/m   Outpatient Encounter Medications as of 02/05/2019  Medication Sig  . acetaminophen (TYLENOL) 325 MG tablet Take 650 mg by mouth every 8 (eight) hours as needed.  Marland Kitchen apixaban (ELIQUIS) 2.5 MG TABS tablet Take 2.5 mg by mouth 2 (two) times  daily.  Roseanne Kaufman Peru-Castor Oil (VENELEX) OINT Apply to sacrum and bilateral buttocks q shift and prn for prevention  . CARTIA XT 300 MG 24 hr capsule TAKE ONE CAPSULE BY MOUTH EVERY DAY  . denosumab (PROLIA) 60 MG/ML SOSY injection Inject 60 mg into the skin every 6 (six) months.  . donepezil (ARICEPT) 5 MG tablet Take 5 mg by mouth at bedtime.  . DULoxetine (CYMBALTA) 30 MG capsule Take 30 mg by mouth daily.  . Fluticasone-Umeclidin-Vilant (TRELEGY ELLIPTA) 100-62.5-25 MCG/INH AEPB One puff inhalation once a day   . Folic Acid 5 MG CAPS Take 5 mg by mouth every Sunday.  . furosemide (LASIX) 40 MG tablet Take 40 mg by mouth daily.  Marland Kitchen guaifenesin (ROBITUSSIN) 100 MG/5ML syrup Take 200 mg by mouth 4 (four) times daily as needed for cough.  . Menthol, Topical Analgesic, (BIOFREEZE) 4 % GEL Apply to both knees  daily as needed  . Menthol, Topical Analgesic, (BIOFREEZE) 4 % GEL Apply to left and right shoulder Once A Day  . methotrexate (RHEUMATREX) 2.5 MG tablet Take 12.5 mg by mouth once a week. On Sunday - Caution:Chemotherapy. Protect from light.  . Multiple Vitamin (DAILY VITE PO) Take 1 tablet by mouth daily.  . NON FORMULARY Diet Type:  NAS, Regular  Consistent Carbohydrates, Dysphagia 3  . OXYGEN Inhale 3.5 L/min into the lungs continuous.  . pantoprazole (PROTONIX) 40 MG tablet Take 40 mg by mouth 2 (two) times daily.  . phenylephrine-shark liver oil-mineral oil-petrolatum (PREPARATION H) 0.25-3-14-71.9 % rectal ointment Place 1 application rectally 2 (two) times daily as needed for hemorrhoids.  . polyethylene glycol (MIRALAX / GLYCOLAX) packet Take 17 g by mouth daily as needed for moderate constipation.  . potassium chloride SA (K-DUR,KLOR-CON) 20 MEQ tablet Take 2 tablets (40 mg ) by mouth twice a day  . pregabalin (LYRICA) 50 MG capsule Take 1 capsule by mouth  Twice daily  . Propylene Glycol (SYSTANE BALANCE) 0.6 % SOLN Place 1 drop into both eyes 2 (two) times daily.  .  rosuvastatin (CRESTOR) 20 MG tablet Take 20 mg by mouth daily.  . Skin Protectants, Misc. (NO-STING SKIN-PREP EX) Apply externally to bilateral heels every shift for prevention   No facility-administered encounter medications on file as of 02/05/2019.  SIGNIFICANT DIAGNOSTIC EXAMS   PREVIOUS;   01-07-19: chest x-ray; COPD. Aortic atherosclerosis no acute pathology    01-20-19: DEXA: t score -4.997  NO NEW EXAMS.    LABS REVIEWED PREVIOUS :   11-08-17: Rheumatoid factor 29.6 (elevated) uric acid 3.6 03-01-18: wbc  6.3; hgb 14.3; hct 48.3; mcv 89.0 plt 183; glucose 107; bun 17; creat 1.26; k+ 4.3; na++ 139; ca 9.7 liver normal albumin 3.3  05-01-18: wbc 5.3 hgb 13.9; hct 46.5; mcv 91.2 plt 125 glucose 125; bun 12; creat 0.86; k+ 4.1; na++ 141; ca 9.7 liver normal albumin 3.4 chol 127 ldl 66; trig 108; hdl 39  06-01-18: hgb a1c 5.9; urine micro-albumin <3.0 09-26-18: wbc 8.3; hgb 16.2; hct 52.3 mcv 93.7; plt 156 11-02-18: glucose 117; bun 16; creat 1.02; k+ 4.2; na++ 142; ca 9.6 ;liver normal albumin 3.5; hgb a1c 5.8; urine micro-albumin 4.3    NO NEW LABS.   Review of Systems  Constitutional: Negative for malaise/fatigue.  Respiratory: Negative for cough and shortness of breath.   Cardiovascular: Negative for chest pain, palpitations and leg swelling.  Gastrointestinal: Negative for abdominal pain, constipation and heartburn.  Musculoskeletal: Negative for back pain, joint pain and myalgias.  Skin: Negative.   Neurological: Negative for dizziness.  Psychiatric/Behavioral: The patient is not nervous/anxious.    Physical Exam Constitutional:      General: She is not in acute distress.    Appearance: She is well-developed. She is not diaphoretic.  Neck:     Musculoskeletal: Neck supple.     Thyroid: No thyromegaly.  Cardiovascular:     Rate and Rhythm: Normal rate and regular rhythm.     Pulses: Normal pulses.     Heart sounds: Normal heart sounds.  Pulmonary:     Effort:  Pulmonary effort is normal. No respiratory distress.     Breath sounds: Normal breath sounds.     Comments: 02 dependent  Abdominal:     General: Bowel sounds are normal. There is no distension.     Palpations: Abdomen is soft.     Tenderness: There is no abdominal tenderness.  Musculoskeletal:     Right lower leg: No edema.     Left lower leg: No edema.     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  Skin:    General: Skin is warm and dry.  Neurological:     Mental Status: She is alert. Mental status is at baseline.  Psychiatric:        Mood and Affect: Mood normal.      ASSESSMENT/ PLAN:  TODAY:   1. Post-menopausal osteoporosis is without change: t score -4.997 (01-20-19) will continue proliz 60 mg every 6 months  2. Chronic constipation: is stable will continue miralax daily as needed  3. Type 2 diabetes controlled with peripheral neuropathy: is stable hgb a1c 5.9 is off tradjenta will monitor     PREVIOUS   4. Dyslipidemia associated with type 2 diabetes mellitus: is stable ldl 66 will continue crestor 20 mg daily   5. Atrial fibrillation with RVR:  Heart rate stable will continue cartia xt 300 mg daily for rate control an eliquis 2.5 mg twice daily   6. Chronic diastolic heart failure: EF 55-60% (07-28-16): is stable will continue lasix 40 mg daily with k+  40 meq twice daily   7. Hypokalemia: is stable k+ 4.1 will continue k+ 40 meq twice daily   8. Unspecified COPD/ chronic respiratory failure with hypoxia: is stable 02 dependent will continue trelegy ellipta 100-62.5-25 mcg 1 puff daily albuterol 2 puffs every 6 hours as needed and duoneb twice daily as needed  9. GERD without esophagitis: is stable will continue protonix 40 mg twice  daily   10. Vascular dementia without behavioral disturbance: without change weight is stable at  202  pounds; will continue aricept 5 mg daily  11. CKD stage 3 due to type 2 diabetes mellitus: is stable bun 12 creat 0.86 will monitor  12. Diabetic peripheral neuropathy associated with type 2 diabetes mellitus: is stable will continue lyrica 50 mg twice daily   13. Fibromyalgia: is stable will continue  lyrica  50 mg twice daily  14. Rheumatoid arteritis: is stable will continue methotrexate 12.5 mg weekly and folic acid 5 mg weekly.    MD is aware of resident's narcotic use and is in agreement with current plan of care. We will attempt to wean resident as appropriate.  Ok Edwards NP [facility]  Contact [phone] Monday through Friday 8am- 5pm  After hours call [phone]

## 2019-02-12 DIAGNOSIS — Z0000 Encounter for general adult medical examination without abnormal findings: Secondary | ICD-10-CM | POA: Insufficient documentation

## 2019-02-17 ENCOUNTER — Other Ambulatory Visit: Payer: Self-pay | Admitting: Internal Medicine

## 2019-02-17 ENCOUNTER — Other Ambulatory Visit (HOSPITAL_COMMUNITY)
Admission: RE | Admit: 2019-02-17 | Discharge: 2019-02-17 | Disposition: A | Discharge status: Home / Self Care | Payer: Medicare Other | Source: Ambulatory Visit | Attending: Internal Medicine | Admitting: Internal Medicine

## 2019-02-17 DIAGNOSIS — Z9189 Other specified personal risk factors, not elsewhere classified: Secondary | ICD-10-CM

## 2019-02-17 DIAGNOSIS — Z20828 Contact with and (suspected) exposure to other viral communicable diseases: Secondary | ICD-10-CM | POA: Insufficient documentation

## 2019-02-20 LAB — SARS CORONAVIRUS 2 (TAT 6-24 HRS): SARS Coronavirus 2: NEGATIVE

## 2019-02-21 ENCOUNTER — Other Ambulatory Visit: Payer: Self-pay | Admitting: Adult Health

## 2019-02-21 MED ORDER — PREGABALIN 50 MG PO CAPS: ORAL_CAPSULE | ORAL | 0 refills | Status: DC

## 2019-02-23 ENCOUNTER — Other Ambulatory Visit: Payer: Self-pay | Admitting: Internal Medicine

## 2019-02-23 ENCOUNTER — Other Ambulatory Visit (HOSPITAL_COMMUNITY)
Admission: RE | Admit: 2019-02-23 | Discharge: 2019-02-23 | Disposition: A | Discharge status: Home / Self Care | Payer: Medicare Other | Source: Ambulatory Visit | Attending: Internal Medicine | Admitting: Internal Medicine

## 2019-02-23 DIAGNOSIS — Z9189 Other specified personal risk factors, not elsewhere classified: Secondary | ICD-10-CM

## 2019-02-23 DIAGNOSIS — Z20828 Contact with and (suspected) exposure to other viral communicable diseases: Secondary | ICD-10-CM | POA: Diagnosis present

## 2019-02-23 LAB — SARS CORONAVIRUS 2 (TAT 6-24 HRS): SARS Coronavirus 2: NEGATIVE

## 2019-02-27 ENCOUNTER — Other Ambulatory Visit (HOSPITAL_COMMUNITY)
Admission: RE | Admit: 2019-02-27 | Discharge: 2019-02-27 | Disposition: A | Discharge status: Home / Self Care | Payer: Medicare Other | Source: Ambulatory Visit | Attending: Internal Medicine | Admitting: Internal Medicine

## 2019-02-27 DIAGNOSIS — Z20828 Contact with and (suspected) exposure to other viral communicable diseases: Secondary | ICD-10-CM | POA: Insufficient documentation

## 2019-02-28 LAB — NOVEL CORONAVIRUS, NAA (HOSP ORDER, SEND-OUT TO REF LAB; TAT 18-24 HRS): SARS-CoV-2, NAA: NOT DETECTED

## 2019-03-04 ENCOUNTER — Other Ambulatory Visit (HOSPITAL_COMMUNITY)
Admission: RE | Admit: 2019-03-04 | Discharge: 2019-03-04 | Disposition: A | Discharge status: Home / Self Care | Payer: Medicare Other | Source: Ambulatory Visit | Attending: Internal Medicine | Admitting: Internal Medicine

## 2019-03-04 DIAGNOSIS — Z20828 Contact with and (suspected) exposure to other viral communicable diseases: Secondary | ICD-10-CM | POA: Diagnosis present

## 2019-03-05 ENCOUNTER — Encounter: Payer: Self-pay | Admitting: Adult Health

## 2019-03-05 ENCOUNTER — Non-Acute Institutional Stay (SKILLED_NURSING_FACILITY): Payer: Medicare Other | Admitting: Adult Health

## 2019-03-05 DIAGNOSIS — E1169 Type 2 diabetes mellitus with other specified complication: Secondary | ICD-10-CM

## 2019-03-05 DIAGNOSIS — E785 Hyperlipidemia, unspecified: Secondary | ICD-10-CM

## 2019-03-05 DIAGNOSIS — I4891 Unspecified atrial fibrillation: Secondary | ICD-10-CM | POA: Diagnosis not present

## 2019-03-05 DIAGNOSIS — I5032 Chronic diastolic (congestive) heart failure: Secondary | ICD-10-CM | POA: Diagnosis not present

## 2019-03-05 NOTE — Progress Notes (Signed)
Location:    [place] Room Number: 106/D Place of Service:  SNF (31)   CODE STATUS: DNR  Allergies  Allergen Reactions  . Other     Band Aids   . Tape   . Lipitor [Atorvastatin Calcium] Rash and Other (See Comments)    Muscle pain  . Niaspan [Niacin Er] Rash and Other (See Comments)    Muscle pain    Chief Complaint  Patient presents with  . Medical Management of Chronic Issues        dyslipidemia associated with type 2 diabetes mellitus: . Atrial fibrillation with RVR:   Chronic diastolic heart failure     HPI:  She is a 85 year old long term resident of this [facility] being seen for the management of her chronic illnesses: dyslipidemia; afib; chf. She denies any uncontrolled pain; no cough no shortness of breath; no changes in appetite.  We have available the mederna vaccine. I have spoken with her family regarding the side effects; possible severe reactions and expected outcomes. At this time her family is wanting to wait and discuss this treatment before agreeing to it.   Past Medical History:  Diagnosis Date  . Anemia   . Atrial fibrillation ([place])   . Back pain   . Bacterial pneumonia   . Cancer (HCC)    Skin   . Chronic respiratory failure ([place])   . Cognitive communication deficit   . COPD (chronic obstructive pulmonary disease) ([place])   . Dementia ([place])   . Dementia ([place])   . Depression   . Diabetes mellitus without complication ([place])   . Diverticulosis   . Diverticulosis   . Fatty liver   . Fibromyalgia   . Fibromyalgia   . Gastric polyp 09/08/11   at the anastomosis-inflammatory  . Generalized anxiety disorder   . Generalized anxiety disorder   . GERD (gastroesophageal reflux disease)   . GERD (gastroesophageal reflux disease)   . 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 Hypercholesteremia   . Hypertension   . Insomnia   . Internal hemorrhoids   . Muscle weakness   . Neuropathy   . On home O2    qhs  . Osteoporosis     . Rheumatoid arteritis ([place])   . Ulcer    stomach  . Vitamin D deficiency     Past Surgical History:  Procedure Laterality Date  . ABDOMINAL HYSTERECTOMY    . ABDOMINAL SURGERY     removed patial stomach from ulcer  . COLONOSCOPY  04-2001   internal hemorrhoids, panocolonic diverticulosis, and colon polyps   . UPPER GASTROINTESTINAL ENDOSCOPY      Social History   Socioeconomic History  . Marital status: Widowed    Spouse name: Not on file  . Number of children: 6  . Years of education: Not on file  . Highest education level: Not on file  Occupational History  . Occupation: Retired  Tobacco Use  . Smoking status: Never Smoker  . Smokeless tobacco: Never Used  Substance and Sexual Activity  . Alcohol use: No  . Drug use: No  . Sexual activity: Not Currently  Other Topics Concern  . Not on file  Social History Narrative   Long term resident of [facility]    Social Determinants of Health   Financial Resource Strain:   . Difficulty of Paying Living Expenses: Not on file  Food Insecurity:   . Worried About Charity fundraiser in the Last Year: Not  on file  . Ran Out of Food in the Last Year: Not on file  Transportation Needs:   . Lack of Transportation (Medical): Not on file  . Lack of Transportation (Non-Medical): Not on file  Physical Activity:   . Days of Exercise per Week: Not on file  . Minutes of Exercise per Session: Not on file  Stress:   . Feeling of Stress : Not on file  Social Connections: Unknown  . Frequency of Communication with Friends and Family: Never  . Frequency of Social Gatherings with Friends and Family: Never  . Attends Religious Services: Not on file  . Active Member of Clubs or Organizations: Not on file  . Attends Archivist Meetings: Not on file  . Marital Status: Not on file  Intimate Partner Violence:   . Fear of Current or Ex-Partner: Not on file  . Emotionally Abused: Not on file  . Physically Abused: Not on file  .  Sexually Abused: Not on file   Family History  Problem Relation Age of Onset  . Heart disease Father   . Kidney disease Mother        kidney cancer  . Colon cancer Daughter 57      VITAL SIGNS BP (!) 149/80   Pulse 74   Temp 98 F (36.7 C) (Oral)   Resp 19   Ht 5\' 3"  (1.6 m)   Wt 204 lb 9.6 oz (92.8 kg)   SpO2 90%   BMI 36.24 kg/m   Outpatient Encounter Medications as of 03/05/2019  Medication Sig  . acetaminophen (TYLENOL) 325 MG tablet Take 650 mg by mouth every 8 (eight) hours as needed.  Marland Kitchen apixaban (ELIQUIS) 2.5 MG TABS tablet Take 2.5 mg by mouth 2 (two) times daily.  Marland Kitchen ascorbic acid (VITAMIN C) 500 MG tablet Take 500 mg by mouth daily.  Marland Kitchen CARTIA XT 300 MG 24 hr capsule TAKE ONE CAPSULE BY MOUTH EVERY DAY  . denosumab (PROLIA) 60 MG/ML SOSY injection Inject 60 mg into the skin every 6 (six) months.  . donepezil (ARICEPT) 5 MG tablet Take 5 mg by mouth at bedtime.  . DULoxetine (CYMBALTA) 30 MG capsule Take 30 mg by mouth daily.  . Fluticasone-Umeclidin-Vilant (TRELEGY ELLIPTA) 100-62.5-25 MCG/INH AEPB One puff inhalation once a day   . Folic Acid 5 MG CAPS Take 5 mg by mouth every Sunday.  . furosemide (LASIX) 40 MG tablet Take 40 mg by mouth daily.  Marland Kitchen guaifenesin (ROBITUSSIN) 100 MG/5ML syrup Take 200 mg by mouth 4 (four) times daily as needed for cough.  . Menthol, Topical Analgesic, (BIOFREEZE) 4 % GEL Apply to both knees  daily as needed  . Menthol, Topical Analgesic, (BIOFREEZE) 4 % GEL Apply to left and right shoulder Once A Day  . methotrexate (RHEUMATREX) 2.5 MG tablet Take 12.5 mg by mouth once a week. On Sunday - Caution:Chemotherapy. Protect from light.  . Multiple Vitamin (DAILY VITE PO) Take 1 tablet by mouth daily.  . NON FORMULARY Diet Type:  NAS, Regular  Consistent Carbohydrates, Dysphagia 3  . OXYGEN Inhale 3.5 L/min into the lungs continuous.  . pantoprazole (PROTONIX) 40 MG tablet Take 40 mg by mouth 2 (two) times daily.  . phenylephrine-shark  liver oil-mineral oil-petrolatum (PREPARATION H) 0.25-3-14-71.9 % rectal ointment Place 1 application rectally 2 (two) times daily as needed for hemorrhoids.  . polyethylene glycol (MIRALAX / GLYCOLAX) packet Take 17 g by mouth daily as needed for moderate constipation.  . potassium chloride  SA (K-DUR,KLOR-CON) 20 MEQ tablet Take 2 tablets (40 mg ) by mouth twice a day  . pregabalin (LYRICA) 50 MG capsule Take 1 capsule by mouth  Twice daily  . Propylene Glycol (SYSTANE BALANCE) 0.6 % SOLN Place 1 drop into both eyes 2 (two) times daily as needed.   . rosuvastatin (CRESTOR) 20 MG tablet Take 20 mg by mouth daily.  Marland Kitchen zinc sulfate 220 (50 Zn) MG capsule Take 220 mg by mouth daily.  . Skin Protectants, Misc. (NO-STING SKIN-PREP EX) Apply externally to bilateral heels every shift for prevention  . [DISCONTINUED] Balsam [place]-Castor Oil (VENELEX) OINT Apply to sacrum and bilateral buttocks q shift and prn for prevention   No facility-administered encounter medications on file as of 03/05/2019.     SIGNIFICANT DIAGNOSTIC EXAMS   PREVIOUS;   01-07-19: chest x-ray; COPD. Aortic atherosclerosis no acute pathology    01-20-19: DEXA: t score -4.997  NO NEW EXAMS.    LABS REVIEWED PREVIOUS :   11-08-17: Rheumatoid factor 29.6 (elevated) uric acid 3.6 05-01-18: wbc 5.3 hgb 13.9; hct 46.5; mcv 91.2 plt 125 glucose 125; bun 12; creat 0.86; k+ 4.1; na++ 141; ca 9.7 liver normal albumin 3.4 chol 127 ldl 66; trig 108; hdl 39  06-01-18: hgb a1c 5.9; urine micro-albumin <3.0 09-26-18: wbc 8.3; hgb 16.2; hct 52.3 mcv 93.7; plt 156 11-02-18: glucose 117; bun 16; creat 1.02; k+ 4.2; na++ 142; ca 9.6 ;liver normal albumin 3.5; hgb a1c 5.8; urine micro-albumin 4.3    NO NEW LABS.   Review of Systems  Constitutional: Negative for malaise/fatigue.  Respiratory: Negative for cough and shortness of breath.   Cardiovascular: Negative for chest pain, palpitations and leg swelling.  Gastrointestinal: Negative for  abdominal pain, constipation and heartburn.  Musculoskeletal: Negative for back pain, joint pain and myalgias.  Skin: Negative.   Neurological: Negative for dizziness.  Psychiatric/Behavioral: The patient is not nervous/anxious.    Physical Exam Constitutional:      General: She is not in acute distress.    Appearance: She is well-developed. She is not diaphoretic.  Neck:     Thyroid: No thyromegaly.  Cardiovascular:     Rate and Rhythm: Normal rate and regular rhythm.     Pulses: Normal pulses.     Heart sounds: Normal heart sounds.  Pulmonary:     Effort: Pulmonary effort is normal. No respiratory distress.     Breath sounds: Normal breath sounds.     Comments: 02 dependent  Abdominal:     General: Bowel sounds are normal. There is no distension.     Palpations: Abdomen is soft.     Tenderness: There is no abdominal tenderness.  Musculoskeletal:     Cervical back: Neck supple.     Right lower leg: No edema.     Left lower leg: No edema.     Comments: Unable to move left upper extremity due to frozen left shoulder Able to move other extremities                 Lymphadenopathy:     Cervical: No cervical adenopathy.  Skin:    General: Skin is warm and dry.  Neurological:     Mental Status: She is alert. Mental status is at baseline.  Psychiatric:        Mood and Affect: Mood normal.      ASSESSMENT/ PLAN:  TODAY:   1. dyslipidemia associated with type 2 diabetes mellitus: is stable LDL 66 will continue crestor  20 mg daily  2. Atrial fibrillation with RVR: heart rate is stable will continue cartia xt 300 mg daily for rate control and eliquis 2.5 mg twice daily   3. Chronic diastolic heart failure EF 55-60% (5-17-2=18) is stable will continue lasix 40 mg daily with k+40 meq twice daily    PREVIOUS   4. Hypokalemia: is stable k+ 4.1 will continue k+ 40 meq twice daily   5. Unspecified COPD/ chronic respiratory failure with hypoxia: is stable 02 dependent will  continue trelegy ellipta 100-62.5-25 mcg 1 puff daily albuterol 2 puffs every 6 hours as needed and duoneb twice daily as needed  6. GERD without esophagitis: is stable will continue protonix 40 mg twice  daily   7. Vascular dementia without behavioral disturbance: without change weight is stable at 202  pounds; will continue aricept 5 mg daily  8. CKD stage 3 due to type 2 diabetes mellitus: is stable bun 12 creat 0.86 will monitor  9. Diabetic peripheral neuropathy associated with type 2 diabetes mellitus: is stable will continue lyrica 50 mg twice daily   10. Fibromyalgia: is stable will continue  lyrica  50 mg twice daily  11. Rheumatoid arteritis: is stable will continue methotrexate 12.5 mg weekly and folic acid 5 mg weekly.  12. Post-menopausal osteoporosis is without change: t score -4.997 (01-20-19) will continue proliz 60 mg every 6 months  13. Chronic constipation: is stable will continue miralax daily as needed  14. Type 2 diabetes controlled with peripheral neuropathy: is stable hgb a1c 5.9 is off tradjenta will monitor    She will not be setup for the vaccine at this time; if her family changes their mind will setup at that time.    MD is aware of resident's narcotic use and is in agreement with current plan of care. We will attempt to wean resident as appropriate.  Ok Edwards NP [facility]  Contact [phone] Monday through Friday 8am- 5pm  After hours call [phone]

## 2019-03-06 LAB — NOVEL CORONAVIRUS, NAA (HOSP ORDER, SEND-OUT TO REF LAB; TAT 18-24 HRS): SARS-CoV-2, NAA: NOT DETECTED

## 2019-03-07 ENCOUNTER — Other Ambulatory Visit: Payer: Self-pay | Admitting: Adult Health

## 2019-03-10 ENCOUNTER — Other Ambulatory Visit (HOSPITAL_COMMUNITY)
Admission: RE | Admit: 2019-03-10 | Discharge: 2019-03-10 | Disposition: A | Discharge status: Home / Self Care | Payer: Medicare Other | Source: Ambulatory Visit | Attending: Adult Health | Admitting: Adult Health

## 2019-03-14 ENCOUNTER — Encounter: Payer: Self-pay | Admitting: Adult Health

## 2019-03-14 ENCOUNTER — Non-Acute Institutional Stay (SKILLED_NURSING_FACILITY): Payer: Medicare Other | Admitting: Adult Health

## 2019-03-14 DIAGNOSIS — F339 Major depressive disorder, recurrent, unspecified: Secondary | ICD-10-CM | POA: Diagnosis not present

## 2019-03-14 NOTE — Progress Notes (Signed)
Location:    [facility] Room Number: 149/W Place of Service:  SNF (31)   CODE STATUS: DNR  Allergies  Allergen Reactions  . Other     Band Aids   . Tape   . Lipitor [Atorvastatin Calcium] Rash and Other (See Comments)    Muscle pain  . Niaspan [Niacin Er] Rash and Other (See Comments)    Muscle pain    Chief Complaint  Patient presents with  . Acute Visit    Med Management    HPI:  She is taking cymbalta 30 mg daily for her depression. There are no reports of anxiety depressive thoughts no changes in appetite no insomnia.   Past Medical History:  Diagnosis Date  . Anemia   . Atrial fibrillation ([place])   . Back pain   . Bacterial pneumonia   . Cancer (HCC)    Skin   . Chronic respiratory failure ([place])   . Cognitive communication deficit   . COPD (chronic obstructive pulmonary disease) ([place])   . Dementia ([place])   . Dementia ([place])   . Depression   . Diabetes mellitus without complication ([place])   . Diverticulosis   . Diverticulosis   . Fatty liver   . Fibromyalgia   . Fibromyalgia   . Gastric polyp 09/08/11   at the anastomosis-inflammatory  . Generalized anxiety disorder   . Generalized anxiety disorder   . GERD (gastroesophageal reflux disease)   . GERD (gastroesophageal reflux disease)   . 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 Hypercholesteremia   . Hypertension   . Insomnia   . Internal hemorrhoids   . Muscle weakness   . Neuropathy   . On home O2    qhs  . Osteoporosis   . Rheumatoid arteritis ([place])   . Ulcer    stomach  . Vitamin D deficiency     Past Surgical History:  Procedure Laterality Date  . ABDOMINAL HYSTERECTOMY    . ABDOMINAL SURGERY     removed patial stomach from ulcer  . COLONOSCOPY  04-2001   internal hemorrhoids, panocolonic diverticulosis, and colon polyps   . UPPER GASTROINTESTINAL ENDOSCOPY      Social History   Socioeconomic History  . Marital status: Widowed    Spouse name: Not on file    . Number of children: 6  . Years of education: Not on file  . Highest education level: Not on file  Occupational History  . Occupation: Retired  Tobacco Use  . Smoking status: Never Smoker  . Smokeless tobacco: Never Used  Substance and Sexual Activity  . Alcohol use: No  . Drug use: No  . Sexual activity: Not Currently  Other Topics Concern  . Not on file  Social History Narrative   Long term resident of [facility]    Social Determinants of Health   Financial Resource Strain:   . Difficulty of Paying Living Expenses: Not on file  Food Insecurity:   . Worried About Charity fundraiser in the Last Year: Not on file  . Ran Out of Food in the Last Year: Not on file  Transportation Needs:   . Lack of Transportation (Medical): Not on file  . Lack of Transportation (Non-Medical): Not on file  Physical Activity:   . Days of Exercise per Week: Not on file  . Minutes of Exercise per Session: Not on file  Stress:   . Feeling of Stress : Not on file  Social Connections: Unknown  .  Frequency of Communication with Friends and Family: Never  . Frequency of Social Gatherings with Friends and Family: Never  . Attends Religious Services: Not on file  . Active Member of Clubs or Organizations: Not on file  . Attends Archivist Meetings: Not on file  . Marital Status: Not on file  Intimate Partner Violence:   . Fear of Current or Ex-Partner: Not on file  . Emotionally Abused: Not on file  . Physically Abused: Not on file  . Sexually Abused: Not on file   Family History  Problem Relation Age of Onset  . Heart disease Father   . Kidney disease Mother        kidney cancer  . Colon cancer Daughter 19      VITAL SIGNS BP 120/68   Pulse 71   Temp (!) 97 F (36.1 C) (Oral)   Resp 20   Ht 5\' 3"  (1.6 m)   Wt 204 lb 9.6 oz (92.8 kg)   SpO2 98%   BMI 36.24 kg/m   Outpatient Encounter Medications as of 03/14/2019  Medication Sig  . acetaminophen (TYLENOL) 325 MG tablet Take  650 mg by mouth every 8 (eight) hours as needed.  Marland Kitchen apixaban (ELIQUIS) 2.5 MG TABS tablet Take 2.5 mg by mouth 2 (two) times daily.  Marland Kitchen CARTIA XT 300 MG 24 hr capsule TAKE ONE CAPSULE BY MOUTH EVERY DAY  . denosumab (PROLIA) 60 MG/ML SOSY injection Inject 60 mg into the skin every 6 (six) months.  . donepezil (ARICEPT) 5 MG tablet Take 5 mg by mouth at bedtime.  . DULoxetine (CYMBALTA) 30 MG capsule Take 30 mg by mouth daily.  . Fluticasone-Umeclidin-Vilant (TRELEGY ELLIPTA) 100-62.5-25 MCG/INH AEPB One puff inhalation once a day   . Folic Acid 5 MG CAPS Take 5 mg by mouth every Sunday.  . furosemide (LASIX) 40 MG tablet Take 40 mg by mouth daily.  Marland Kitchen guaifenesin (ROBITUSSIN) 100 MG/5ML syrup Take 200 mg by mouth 4 (four) times daily as needed for cough.  . Menthol, Topical Analgesic, (BIOFREEZE) 4 % GEL Apply to both knees  daily as needed  . Menthol, Topical Analgesic, (BIOFREEZE) 4 % GEL Apply to left and right shoulder Once A Day  . methotrexate (RHEUMATREX) 2.5 MG tablet Take 12.5 mg by mouth once a week. On Sunday - Caution:Chemotherapy. Protect from light.  . Multiple Vitamin (DAILY VITE PO) Take 1 tablet by mouth daily.  . NON FORMULARY Diet Type:  NAS, Regular  Consistent Carbohydrates, Dysphagia 3  . OXYGEN Inhale 3.5 L/min into the lungs continuous.  . pantoprazole (PROTONIX) 40 MG tablet Take 40 mg by mouth 2 (two) times daily.  . phenylephrine-shark liver oil-mineral oil-petrolatum (PREPARATION H) 0.25-3-14-71.9 % rectal ointment Place 1 application rectally 2 (two) times daily as needed for hemorrhoids.  . polyethylene glycol (MIRALAX / GLYCOLAX) packet Take 17 g by mouth daily as needed for moderate constipation.  . potassium chloride SA (K-DUR,KLOR-CON) 20 MEQ tablet Take 2 tablets (40 mg ) by mouth twice a day  . pregabalin (LYRICA) 50 MG capsule Take 1 capsule by mouth  Twice daily  . Propylene Glycol (SYSTANE BALANCE) 0.6 % SOLN Place 1 drop into both eyes 2 (two) times daily as  needed.   . rosuvastatin (CRESTOR) 20 MG tablet Take 20 mg by mouth daily.  . Skin Protectants, Misc. (NO-STING SKIN-PREP EX) Apply externally to bilateral heels every shift for prevention   No facility-administered encounter medications on file as of 03/14/2019.  SIGNIFICANT DIAGNOSTIC EXAMS   PREVIOUS;   01-07-19: chest x-ray; COPD. Aortic atherosclerosis no acute pathology    01-20-19: DEXA: t score -4.997  NO NEW EXAMS.    LABS REVIEWED PREVIOUS :   11-08-17: Rheumatoid factor 29.6 (elevated) uric acid 3.6 05-01-18: wbc 5.3 hgb 13.9; hct 46.5; mcv 91.2 plt 125 glucose 125; bun 12; creat 0.86; k+ 4.1; na++ 141; ca 9.7 liver normal albumin 3.4 chol 127 ldl 66; trig 108; hdl 39  06-01-18: hgb a1c 5.9; urine micro-albumin <3.0 09-26-18: wbc 8.3; hgb 16.2; hct 52.3 mcv 93.7; plt 156 11-02-18: glucose 117; bun 16; creat 1.02; k+ 4.2; na++ 142; ca 9.6 ;liver normal albumin 3.5; hgb a1c 5.8; urine micro-albumin 4.3    NO NEW LABS.   Review of Systems  Constitutional: Negative for malaise/fatigue.  Respiratory: Negative for cough and shortness of breath.   Cardiovascular: Negative for chest pain, palpitations and leg swelling.  Gastrointestinal: Negative for abdominal pain, constipation and heartburn.  Musculoskeletal: Negative for back pain, joint pain and myalgias.  Skin: Negative.   Neurological: Negative for dizziness.  Psychiatric/Behavioral: The patient is not nervous/anxious.     Physical Exam Constitutional:      General: She is not in acute distress.    Appearance: She is well-developed. She is not diaphoretic.  Neck:     Thyroid: No thyromegaly.  Cardiovascular:     Rate and Rhythm: Normal rate and regular rhythm.     Pulses: Normal pulses.     Heart sounds: Normal heart sounds.  Pulmonary:     Effort: Pulmonary effort is normal. No respiratory distress.     Breath sounds: Normal breath sounds.     Comments: 02 dependent  Abdominal:     General: Bowel sounds  are normal. There is no distension.     Palpations: Abdomen is soft.     Tenderness: There is no abdominal tenderness.  Musculoskeletal:     Cervical back: Neck supple.     Right lower leg: No edema.     Left lower leg: No edema.     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  Skin:    General: Skin is warm and dry.  Neurological:     Mental Status: She is alert. Mental status is at baseline.  Psychiatric:        Mood and Affect: Mood normal.       ASSESSMENT/ PLAN:  TODAY  1. Major depression recurrent chronic: is stable will continue cymbal 30 mg daily lowering her dose could cause her emotional harm.    MD is aware of resident's narcotic use and is in agreement with current plan of care. We will attempt to wean resident as appropriate.  Ok Edwards NP [facility]  Contact [phone] Monday through Friday 8am- 5pm  After hours call [phone]

## 2019-03-20 ENCOUNTER — Encounter (HOSPITAL_COMMUNITY)
Admission: AD | Admit: 2019-03-20 | Discharge: 2019-03-20 | Disposition: A | Discharge status: Home / Self Care | Payer: Medicare Other | Source: Skilled Nursing Facility | Attending: Adult Health | Admitting: Adult Health

## 2019-03-20 DIAGNOSIS — I130 Hypertensive heart and chronic kidney disease with heart failure and stage 1 through stage 4 chronic kidney disease, or unspecified chronic kidney disease: Secondary | ICD-10-CM | POA: Insufficient documentation

## 2019-03-20 DIAGNOSIS — I129 Hypertensive chronic kidney disease with stage 1 through stage 4 chronic kidney disease, or unspecified chronic kidney disease: Secondary | ICD-10-CM | POA: Diagnosis not present

## 2019-03-20 DIAGNOSIS — Z1159 Encounter for screening for other viral diseases: Secondary | ICD-10-CM | POA: Diagnosis not present

## 2019-03-20 DIAGNOSIS — J449 Chronic obstructive pulmonary disease, unspecified: Secondary | ICD-10-CM | POA: Diagnosis not present

## 2019-03-20 LAB — BASIC METABOLIC PANEL
Anion gap: 10 (ref 5–15)
BUN: 21 mg/dL (ref 8–23)
CO2: 27 mmol/L (ref 22–32)
Calcium: 9.5 mg/dL (ref 8.9–10.3)
Chloride: 101 mmol/L (ref 98–111)
Creatinine, Ser: 1 mg/dL (ref 0.44–1.00)
GFR calc Af Amer: 58 mL/min — ABNORMAL LOW (ref >60)
GFR calc non Af Amer: 50 mL/min — ABNORMAL LOW (ref >60)
Glucose, Bld: 168 mg/dL — ABNORMAL HIGH (ref 70–99)
Potassium: 3.9 mmol/L (ref 3.5–5.1)
Sodium: 138 mmol/L (ref 135–145)

## 2019-03-20 LAB — HEPATIC FUNCTION PANEL
ALT: 34 U/L (ref 0–44)
AST: 45 U/L — ABNORMAL HIGH (ref 15–41)
Albumin: 3.2 g/dL — ABNORMAL LOW (ref 3.5–5.0)
Alkaline Phosphatase: 51 U/L (ref 38–126)
Bilirubin, Direct: 0.2 mg/dL (ref 0.0–0.2)
Indirect Bilirubin: 0.7 mg/dL (ref 0.3–0.9)
Total Bilirubin: 0.9 mg/dL (ref 0.3–1.2)
Total Protein: 6.1 g/dL — ABNORMAL LOW (ref 6.5–8.1)

## 2019-03-20 LAB — CBC
HCT: 44.9 % (ref 36.0–46.0)
Hemoglobin: 13.9 g/dL (ref 12.0–15.0)
MCH: 29.6 pg (ref 26.0–34.0)
MCHC: 31 g/dL (ref 30.0–36.0)
MCV: 95.7 fL (ref 80.0–100.0)
Platelets: 97 10*3/uL — ABNORMAL LOW (ref 150–400)
RBC: 4.69 MIL/uL (ref 3.87–5.11)
RDW: 14.2 % (ref 11.5–15.5)
WBC: 4.8 10*3/uL (ref 4.0–10.5)
nRBC: 0 % (ref 0.0–0.2)

## 2019-03-27 ENCOUNTER — Other Ambulatory Visit (HOSPITAL_COMMUNITY)
Admission: RE | Admit: 2019-03-27 | Discharge: 2019-03-27 | Disposition: A | Discharge status: Home / Self Care | Payer: Medicare Other | Source: Skilled Nursing Facility | Attending: Adult Health | Admitting: Adult Health

## 2019-03-27 DIAGNOSIS — I776 Arteritis, unspecified: Secondary | ICD-10-CM | POA: Insufficient documentation

## 2019-03-27 LAB — CBC
HCT: 48.1 % — ABNORMAL HIGH (ref 36.0–46.0)
Hemoglobin: 15.1 g/dL — ABNORMAL HIGH (ref 12.0–15.0)
MCH: 29.8 pg (ref 26.0–34.0)
MCHC: 31.4 g/dL (ref 30.0–36.0)
MCV: 94.9 fL (ref 80.0–100.0)
Platelets: 121 10*3/uL — ABNORMAL LOW (ref 150–400)
RBC: 5.07 MIL/uL (ref 3.87–5.11)
RDW: 14.3 % (ref 11.5–15.5)
WBC: 6.1 10*3/uL (ref 4.0–10.5)
nRBC: 0 % (ref 0.0–0.2)

## 2019-03-28 DIAGNOSIS — J449 Chronic obstructive pulmonary disease, unspecified: Secondary | ICD-10-CM | POA: Diagnosis not present

## 2019-03-28 DIAGNOSIS — I129 Hypertensive chronic kidney disease with stage 1 through stage 4 chronic kidney disease, or unspecified chronic kidney disease: Secondary | ICD-10-CM | POA: Diagnosis not present

## 2019-03-28 DIAGNOSIS — Z1159 Encounter for screening for other viral diseases: Secondary | ICD-10-CM | POA: Diagnosis not present

## 2019-04-04 DIAGNOSIS — I129 Hypertensive chronic kidney disease with stage 1 through stage 4 chronic kidney disease, or unspecified chronic kidney disease: Secondary | ICD-10-CM | POA: Diagnosis not present

## 2019-04-04 DIAGNOSIS — Z1159 Encounter for screening for other viral diseases: Secondary | ICD-10-CM | POA: Diagnosis not present

## 2019-04-04 DIAGNOSIS — J449 Chronic obstructive pulmonary disease, unspecified: Secondary | ICD-10-CM | POA: Diagnosis not present

## 2019-04-10 ENCOUNTER — Non-Acute Institutional Stay (SKILLED_NURSING_FACILITY): Payer: Medicare Other | Admitting: Internal Medicine

## 2019-04-10 ENCOUNTER — Encounter: Payer: Self-pay | Admitting: Internal Medicine

## 2019-04-10 DIAGNOSIS — J9611 Chronic respiratory failure with hypoxia: Secondary | ICD-10-CM

## 2019-04-10 DIAGNOSIS — K219 Gastro-esophageal reflux disease without esophagitis: Secondary | ICD-10-CM

## 2019-04-10 DIAGNOSIS — J449 Chronic obstructive pulmonary disease, unspecified: Secondary | ICD-10-CM

## 2019-04-10 NOTE — Progress Notes (Signed)
Location:  [facility] Room Number: 106D Place of Service:  SNF (31)  [name], MD  Patient Care Team: [name], MD as PCP - General (Internal Medicine) Nyoka Cowden Phylis Bougie, NP as Nurse Practitioner ([place], [place] ([place])  Extended Emergency Contact Information Primary Emergency Contact: Nance,Sheila Address: [address]          [place], [place] [address] [place] Phone: [phone] Relation: Daughter Secondary Emergency Contact: Charlynne Pander, [place] [address] [place] Phone: [phone] Relation: Granddaughter    Allergies: Other, Tape, Lipitor [atorvastatin calcium], and Niaspan [niacin er]  Chief Complaint  Patient presents with  . Medical Management of Chronic Issues    Routine Visit    HPI: Patient is 86 y.o. female who is being seen for routine issues of chronic respiratory failure, COPD, and GERD.  Past Medical History:  Diagnosis Date  . Anemia   . Atrial fibrillation ([place])   . Back pain   . Bacterial pneumonia   . Cancer (HCC)    Skin   . Chronic respiratory failure ([place])   . Cognitive communication deficit   . COPD (chronic obstructive pulmonary disease) ([place])   . Dementia ([place])   . Dementia ([place])   . Depression   . Diabetes mellitus without complication ([place])   . Diverticulosis   . Diverticulosis   . Fatty liver   . Fibromyalgia   . Fibromyalgia   . Gastric polyp 09/08/11   at the anastomosis-inflammatory  . Generalized anxiety disorder   . Generalized anxiety disorder   . GERD (gastroesophageal reflux disease)   . GERD (gastroesophageal reflux disease)   . 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 Hypercholesteremia   . Hypertension   . Insomnia   . Internal hemorrhoids   . Muscle weakness   . Neuropathy   . On home O2    qhs  . Osteoporosis   . Rheumatoid arteritis ([place])   . Ulcer    stomach    . Vitamin D deficiency     Past Surgical History:  Procedure Laterality Date  . ABDOMINAL HYSTERECTOMY    . ABDOMINAL SURGERY     removed patial stomach from ulcer  . COLONOSCOPY  04-2001   internal hemorrhoids, panocolonic diverticulosis, and colon polyps   . UPPER GASTROINTESTINAL ENDOSCOPY      Allergies as of 04/10/2019      Reactions   Other    Band Aids    Tape    Lipitor [atorvastatin Calcium] Rash, Other (See Comments)   Muscle pain   Niaspan [niacin Er] Rash, Other (See Comments)   Muscle pain      Medication List    Notice   This visit is during an admission. Changes to the med list made in this visit will be reflected in the After Visit Summary of the admission.    Current Outpatient Medications on File Prior to Visit  Medication Sig Dispense Refill  . acetaminophen (TYLENOL) 325 MG tablet Take 650 mg by mouth every 8 (eight) hours as needed. do not exceed 3g/24 hours of apap considering all sources    . apixaban (ELIQUIS) 2.5 MG TABS tablet Take 2.5 mg by mouth 2 (two) times daily.    Marland Kitchen CARTIA XT 300 MG 24 hr capsule TAKE ONE CAPSULE BY MOUTH EVERY DAY 90 capsule 2  . denosumab (PROLIA) 60 MG/ML  SOSY injection Inject 60 mg into the skin every 6 (six) months.    . donepezil (ARICEPT) 5 MG tablet Take 5 mg by mouth at bedtime.  5  . DULoxetine (CYMBALTA) 30 MG capsule Take 30 mg by mouth daily.    . Fluticasone-Umeclidin-Vilant (TRELEGY ELLIPTA) 100-62.5-25 MCG/INH AEPB One puff inhalation once a day     . Folic Acid 5 MG CAPS Take 5 mg by mouth every Sunday.    . furosemide (LASIX) 40 MG tablet Take 40 mg by mouth daily.    Marland Kitchen guaifenesin (ROBITUSSIN) 100 MG/5ML syrup Take 200 mg by mouth 4 (four) times daily as needed for cough.    . Menthol, Topical Analgesic, (BIOFREEZE) 4 % GEL Apply to both knees  daily as needed    . Menthol, Topical Analgesic, (BIOFREEZE) 4 % GEL Apply to left and right shoulder Once A Day    . methotrexate (RHEUMATREX) 2.5 MG tablet Take  12.5 mg by mouth once a week. On Sunday - Caution:Chemotherapy. Protect from light.    . NON FORMULARY Diet Type:  NAS, Regular  Consistent Carbohydrates, Dysphagia 3    . OXYGEN Inhale 3.5 L/min into the lungs continuous.    . pantoprazole (PROTONIX) 40 MG tablet Take 40 mg by mouth 2 (two) times daily.    . phenylephrine-shark liver oil-mineral oil-petrolatum (PREPARATION H) 0.25-3-14-71.9 % rectal ointment Place 1 application rectally 2 (two) times daily as needed for hemorrhoids.    . polyethylene glycol (MIRALAX / GLYCOLAX) packet Take 17 g by mouth daily as needed for moderate constipation. 14 each 0  . potassium chloride SA (K-DUR,KLOR-CON) 20 MEQ tablet Take 2 tablets (40 mg ) by mouth twice a day    . Propylene Glycol (SYSTANE BALANCE) 0.6 % SOLN Place 1 drop into both eyes 2 (two) times daily as needed.     . rosuvastatin (CRESTOR) 20 MG tablet Take 20 mg by mouth daily.     No current facility-administered medications on file prior to visit.     No orders of the defined types were placed in this encounter.   Immunization History  Administered Date(s) Administered  . Influenza-Unspecified 12/17/2018  . Tdap 10/21/2016    Social History   Tobacco Use  . Smoking status: Never Smoker  . Smokeless tobacco: Never Used  Substance Use Topics  . Alcohol use: No    Review of Systems   GENERAL:  no fevers, fatigue, appetite changes SKIN: No itching, rash HEENT: No complaint RESPIRATORY: No cough, wheezing, SOB CARDIAC: No chest pain, palpitations, lower extremity edema  GI: No abdominal pain, No N/V/D or constipation, No heartburn or reflux  GU: No dysuria, frequency or urgency, or incontinence  MUSCULOSKELETAL: No unrelieved bone/joint pain NEUROLOGIC: No headache, dizziness  PSYCHIATRIC: No overt anxiety or sadness  Vitals:   04/10/19 1053  BP: (!) 171/92  Pulse: 61  Resp: (!) 24  Temp: 98.2 F (36.8 C)  SpO2: 92%   Body mass index is 36.7 kg/m. Physical  Exam  GENERAL APPEARANCE: Alert, conversant, No acute distress  SKIN: No diaphoresis rash HEENT: Unremarkable RESPIRATORY: Breathing is even, unlabored. Lung sounds are clear   CARDIOVASCULAR: Heart RRR no murmurs, rubs or gallops. No peripheral edema  GASTROINTESTINAL: Abdomen is soft, non-tender, not distended w/ normal bowel sounds.  GENITOURINARY: Bladder non tender, not distended  MUSCULOSKELETAL: No abnormal joints or musculature NEUROLOGIC: Cranial nerves 2-12 grossly intact.  Left frozen shoulder, otherwise able to move other extremities PSYCHIATRIC: Mood and affect  appropriate to situation, no behavioral issues  Patient Active Problem List   Diagnosis Date Noted  . Medicare annual wellness visit, subsequent 02/12/2019  . Major depression, recurrent, chronic ([place]) 11/19/2018  . Dyslipidemia associated with type 2 diabetes mellitus ([place]) 05/03/2018  . Diabetic peripheral neuropathy associated with type 2 diabetes mellitus ([place]) 05/03/2018  . Vascular dementia without behavioral disturbance ([place]) 05/03/2018  . Post-menopausal osteoporosis 05/03/2018  . Chronic constipation 05/03/2018  . Left shoulder pain 07/25/2017  . Chronic diastolic heart failure ([place]) 07/03/2017  . Type 2 diabetes, controlled, with peripheral neuropathy ([place]) 04/20/2017  . Closed compression fracture of L1 lumbar vertebra 04/10/2017  . CKD stage 3 due to type 2 diabetes mellitus ([place]) 04/01/2017  . Depression 03/31/2017  . Peripheral neuropathy 08/01/2016  . Hypertension 07/26/2016  . Prolonged QT interval 07/26/2016  . Chronic respiratory failure with hypoxia ([place]) 07/21/2013  . Hypokalemia 07/21/2013  . Atrial fibrillation with RVR ([place]) 07/21/2013  . Fibromyalgia   . COPD (chronic obstructive pulmonary disease) ([place])   . GERD (gastroesophageal reflux disease)   . Rheumatoid arteritis ([place])   . Back pain   . Fracture of distal fibula 09/20/2010    CMP     Component Value Date/Time   NA 138  03/20/2019 0430   K 3.9 03/20/2019 0430   CL 101 03/20/2019 0430   CO2 27 03/20/2019 0430   GLUCOSE 168 (H) 03/20/2019 0430   BUN 21 03/20/2019 0430   CREATININE 1.00 03/20/2019 0430   CREATININE 1.14 (H) 09/11/2013 1016   CALCIUM 9.5 03/20/2019 0430   PROT 6.1 (L) 03/20/2019 0430   ALBUMIN 3.2 (L) 03/20/2019 0430   AST 45 (H) 03/20/2019 0430   ALT 34 03/20/2019 0430   ALKPHOS 51 03/20/2019 0430   BILITOT 0.9 03/20/2019 0430   GFRNONAA 50 (L) 03/20/2019 0430   GFRAA 58 (L) 03/20/2019 0430   Recent Labs    05/01/18 0700 11/02/18 0823 03/20/19 0430  NA 141 142 138  K 4.1 4.2 3.9  CL 107 106 101  CO2 27 27 27   GLUCOSE 125* 117* 168*  BUN 12 16 21   CREATININE 0.86 1.02* 1.00  CALCIUM 9.7 9.6 9.5   Recent Labs    05/01/18 0700 11/02/18 0823 03/20/19 0430  AST 32 25 45*  ALT 23 22 34  ALKPHOS 64 75 51  BILITOT 0.7 0.6 0.9  PROT 6.1* 6.3* 6.1*  ALBUMIN 3.4* 3.5 3.2*   Recent Labs    05/01/18 0700 05/01/18 0700 09/26/18 0900 03/20/19 0430 03/27/19 0630  WBC 5.3   < > 8.3 4.8 6.1  NEUTROABS 2.9  --  4.3  --   --   HGB 13.9   < > 16.2* 13.9 15.1*  HCT 46.5*   < > 52.3* 44.9 48.1*  MCV 91.2   < > 93.7 95.7 94.9  PLT 125*   < > 156 97* 121*   < > = values in this interval not displayed.   Recent Labs    05/01/18 0700  CHOL 127  LDLCALC 66  TRIG 108   Lab Results  Component Value Date   MICROALBUR 4.3 (H) 11/02/2018   Lab Results  Component Value Date   TSH 2.992 07/26/2016   Lab Results  Component Value Date   HGBA1C 5.8 (H) 11/02/2018   Lab Results  Component Value Date   CHOL 127 05/01/2018   HDL 39 (L) 05/01/2018   LDLCALC 66 05/01/2018   TRIG 108 05/01/2018   CHOLHDL  3.3 05/01/2018    Significant Diagnostic Results in last 30 days:  No results found.  Assessment and Plan  Chronic respiratory failure with hypoxia (HCC) Chronic and stable on 2 L nasal cannula; continue supportive care  COPD (chronic obstructive pulmonary disease)  (HCC) Chronic and stable; continue Trelegy Ellipta 1 puff daily and as needed guaifenesin  GERD (gastroesophageal reflux disease) No complaints or reports of problems; continue Protonix 40 mg twice daily      [name], MD

## 2019-04-11 DIAGNOSIS — I129 Hypertensive chronic kidney disease with stage 1 through stage 4 chronic kidney disease, or unspecified chronic kidney disease: Secondary | ICD-10-CM | POA: Diagnosis not present

## 2019-04-11 DIAGNOSIS — J449 Chronic obstructive pulmonary disease, unspecified: Secondary | ICD-10-CM | POA: Diagnosis not present

## 2019-04-11 DIAGNOSIS — Z1159 Encounter for screening for other viral diseases: Secondary | ICD-10-CM | POA: Diagnosis not present

## 2019-04-15 ENCOUNTER — Encounter: Payer: Self-pay | Admitting: Internal Medicine

## 2019-04-15 ENCOUNTER — Other Ambulatory Visit: Payer: Self-pay | Admitting: Adult Health

## 2019-04-15 MED ORDER — PREGABALIN 50 MG PO CAPS: ORAL_CAPSULE | ORAL | 0 refills | Status: DC

## 2019-04-15 NOTE — Assessment & Plan Note (Signed)
Chronic and stable; continue Trelegy Ellipta 1 puff daily and as needed guaifenesin

## 2019-04-15 NOTE — Assessment & Plan Note (Signed)
Chronic and stable on 2 L nasal cannula; continue supportive care

## 2019-04-15 NOTE — Assessment & Plan Note (Signed)
No complaints or reports of problems; continue Protonix 40 mg twice daily

## 2019-04-26 DIAGNOSIS — Z1159 Encounter for screening for other viral diseases: Secondary | ICD-10-CM | POA: Diagnosis not present

## 2019-04-26 DIAGNOSIS — I129 Hypertensive chronic kidney disease with stage 1 through stage 4 chronic kidney disease, or unspecified chronic kidney disease: Secondary | ICD-10-CM | POA: Diagnosis not present

## 2019-04-26 DIAGNOSIS — J449 Chronic obstructive pulmonary disease, unspecified: Secondary | ICD-10-CM | POA: Diagnosis not present

## 2019-05-03 DIAGNOSIS — J449 Chronic obstructive pulmonary disease, unspecified: Secondary | ICD-10-CM | POA: Diagnosis not present

## 2019-05-03 DIAGNOSIS — Z1159 Encounter for screening for other viral diseases: Secondary | ICD-10-CM | POA: Diagnosis not present

## 2019-05-03 DIAGNOSIS — I129 Hypertensive chronic kidney disease with stage 1 through stage 4 chronic kidney disease, or unspecified chronic kidney disease: Secondary | ICD-10-CM | POA: Diagnosis not present

## 2019-05-08 ENCOUNTER — Other Ambulatory Visit: Payer: Self-pay | Admitting: Adult Health

## 2019-05-08 MED ORDER — PREGABALIN 50 MG PO CAPS: ORAL_CAPSULE | ORAL | 0 refills | Status: DC

## 2019-05-10 ENCOUNTER — Encounter: Payer: Self-pay | Admitting: Adult Health

## 2019-05-10 ENCOUNTER — Non-Acute Institutional Stay (SKILLED_NURSING_FACILITY): Payer: Medicare Other | Admitting: Adult Health

## 2019-05-10 DIAGNOSIS — E876 Hypokalemia: Secondary | ICD-10-CM

## 2019-05-10 DIAGNOSIS — J449 Chronic obstructive pulmonary disease, unspecified: Secondary | ICD-10-CM | POA: Diagnosis not present

## 2019-05-10 DIAGNOSIS — K219 Gastro-esophageal reflux disease without esophagitis: Secondary | ICD-10-CM | POA: Diagnosis not present

## 2019-05-10 DIAGNOSIS — J9611 Chronic respiratory failure with hypoxia: Secondary | ICD-10-CM | POA: Diagnosis not present

## 2019-05-10 NOTE — Progress Notes (Signed)
Location:    [facility] Room Number: 106D Place of Service:  SNF (31) Phillips Grout NP    CODE STATUS: DNR  Allergies  Allergen Reactions  . Other     Band Aids   . Tape   . Lipitor [Atorvastatin Calcium] Rash and Other (See Comments)    Muscle pain  . Niaspan [Niacin Er] Rash and Other (See Comments)    Muscle pain    Chief Complaint  Patient presents with  . Medical Management of Chronic Issues        Hypokalemia   Unspecified COPD/chronic respiratory failure with hypoxia:      GERD without esophagitis:    HPI:  She is a 86 year old long term resident of this facility being seen for the management of her chronic illnesses: hypokalemia; copd; gerd. There are no reports of shortness of breath; no reports of changes in appetite; no reports of uncontrolled pain.   Past Medical History:  Diagnosis Date  . Anemia   . Atrial fibrillation ([place])   . Back pain   . Bacterial pneumonia   . Cancer (HCC)    Skin   . Chronic respiratory failure ([place])   . Cognitive communication deficit   . COPD (chronic obstructive pulmonary disease) ([place])   . Dementia ([place])   . Dementia ([place])   . Depression   . Diabetes mellitus without complication ([place])   . Diverticulosis   . Diverticulosis   . Fatty liver   . Fibromyalgia   . Fibromyalgia   . Gastric polyp 09/08/11   at the anastomosis-inflammatory  . Generalized anxiety disorder   . Generalized anxiety disorder   . GERD (gastroesophageal reflux disease)   . GERD (gastroesophageal reflux disease)   . 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 Hypercholesteremia   . Hypertension   . Insomnia   . Internal hemorrhoids   . Muscle weakness   . Neuropathy   . On home O2    qhs  . Osteoporosis   . Rheumatoid arteritis ([place])   . Ulcer    stomach  . Vitamin D deficiency     Past Surgical History:  Procedure Laterality Date  . ABDOMINAL HYSTERECTOMY    . ABDOMINAL SURGERY     removed patial stomach from  ulcer  . COLONOSCOPY  04-2001   internal hemorrhoids, panocolonic diverticulosis, and colon polyps   . UPPER GASTROINTESTINAL ENDOSCOPY      Social History   Socioeconomic History  . Marital status: Widowed    Spouse name: Not on file  . Number of children: 6  . Years of education: Not on file  . Highest education level: Not on file  Occupational History  . Occupation: Retired  Tobacco Use  . Smoking status: Never Smoker  . Smokeless tobacco: Never Used  Substance and Sexual Activity  . Alcohol use: No  . Drug use: No  . Sexual activity: Not Currently  Other Topics Concern  . Not on file  Social History Narrative   Long term resident of [facility]    Social Determinants of Health   Financial Resource Strain:   . Difficulty of Paying Living Expenses: Not on file  Food Insecurity:   . Worried About Charity fundraiser in the Last Year: Not on file  . Ran Out of Food in the Last Year: Not on file  Transportation Needs:   . Lack of Transportation (Medical): Not on file  . Lack of Transportation (Non-Medical):  Not on file  Physical Activity:   . Days of Exercise per Week: Not on file  . Minutes of Exercise per Session: Not on file  Stress:   . Feeling of Stress : Not on file  Social Connections: Unknown  . Frequency of Communication with Friends and Family: Never  . Frequency of Social Gatherings with Friends and Family: Never  . Attends Religious Services: Not on file  . Active Member of Clubs or Organizations: Not on file  . Attends Archivist Meetings: Not on file  . Marital Status: Not on file  Intimate Partner Violence:   . Fear of Current or Ex-Partner: Not on file  . Emotionally Abused: Not on file  . Physically Abused: Not on file  . Sexually Abused: Not on file   Family History  Problem Relation Age of Onset  . Heart disease Father   . Kidney disease Mother        kidney cancer  . Colon cancer Daughter 35      VITAL SIGNS BP (!) 171/92   Pulse  61   Temp 98.1 F (36.7 C) (Oral)   Resp (!) 24   Ht 5\' 3"  (1.6 m)   Wt 207 lb 3.2 oz (94 kg)   SpO2 95%   BMI 36.70 kg/m   Outpatient Encounter Medications as of 05/10/2019  Medication Sig  . acetaminophen (TYLENOL) 325 MG tablet Take 650 mg by mouth every 8 (eight) hours as needed. do not exceed 3g/24 hours of apap considering all sources  . apixaban (ELIQUIS) 2.5 MG TABS tablet Take 2.5 mg by mouth 2 (two) times daily.  Marland Kitchen CARTIA XT 300 MG 24 hr capsule TAKE ONE CAPSULE BY MOUTH EVERY DAY  . denosumab (PROLIA) 60 MG/ML SOSY injection Inject 60 mg into the skin every 6 (six) months.  . donepezil (ARICEPT) 5 MG tablet Take 5 mg by mouth at bedtime.  . DULoxetine (CYMBALTA) 30 MG capsule Take 30 mg by mouth daily.  . Fluticasone-Umeclidin-Vilant (TRELEGY ELLIPTA) 100-62.5-25 MCG/INH AEPB One puff inhalation once a day   . Folic Acid 5 MG CAPS Take 5 mg by mouth every Sunday.  . furosemide (LASIX) 40 MG tablet Take 40 mg by mouth daily.  Marland Kitchen guaifenesin (ROBITUSSIN) 100 MG/5ML syrup Take 200 mg by mouth 4 (four) times daily as needed for cough.  . Menthol, Topical Analgesic, (BIOFREEZE) 4 % GEL Apply to both knees  daily as needed  . Menthol, Topical Analgesic, (BIOFREEZE) 4 % GEL Apply to left and right shoulder Once A Day  . methotrexate (RHEUMATREX) 2.5 MG tablet Take 12.5 mg by mouth once a week. On Sunday - Caution:Chemotherapy. Protect from light.  . NON FORMULARY Diet Type:  NAS, Regular  Consistent Carbohydrates, Dysphagia 3  . OXYGEN Inhale 3.5 L/min into the lungs continuous.  . pantoprazole (PROTONIX) 40 MG tablet Take 40 mg by mouth 2 (two) times daily.  . phenylephrine-shark liver oil-mineral oil-petrolatum (PREPARATION H) 0.25-3-14-71.9 % rectal ointment Place 1 application rectally 2 (two) times daily as needed for hemorrhoids.  . polyethylene glycol (MIRALAX / GLYCOLAX) packet Take 17 g by mouth daily as needed for moderate constipation.  . potassium chloride SA  (K-DUR,KLOR-CON) 20 MEQ tablet Take 2 tablets (40 mg ) by mouth twice a day  . pregabalin (LYRICA) 50 MG capsule Take 1 capsule by mouth  Twice daily  . rosuvastatin (CRESTOR) 20 MG tablet Take 20 mg by mouth daily.  . [DISCONTINUED] Propylene Glycol (SYSTANE BALANCE)  0.6 % SOLN Place 1 drop into both eyes 2 (two) times daily as needed.    No facility-administered encounter medications on file as of 05/10/2019.     SIGNIFICANT DIAGNOSTIC EXAMS   PREVIOUS;   01-07-19: chest x-ray; COPD. Aortic atherosclerosis no acute pathology    01-20-19: DEXA: t score -4.997  NO NEW EXAMS.    LABS REVIEWED PREVIOUS :   06-01-18: hgb a1c 5.9; urine micro-albumin <3.0 09-26-18: wbc 8.3; hgb 16.2; hct 52.3 mcv 93.7; plt 156 11-02-18: glucose 117; bun 16; creat 1.02; k+ 4.2; na++ 142; ca 9.6 ;liver normal albumin 3.5; hgb a1c 5.8; urine micro-albumin 4.3    TODAY  03-20-19: wbc 4.8; hgb 13.9; hct 44.9; mcv 95.7 plt 97; glucose 168; bun 21; creat 1.00 ;k+ 3.9; na++ 138; ca 9.5 liver normal albumin 3.2  03-27-19: wbc 6.1; hgb 15.1; hct 48.1; mcv 94.9 plt 121    Review of Systems  Constitutional: Negative for malaise/fatigue.  Respiratory: Negative for cough and shortness of breath.   Cardiovascular: Negative for chest pain, palpitations and leg swelling.  Gastrointestinal: Negative for abdominal pain, constipation and heartburn.  Musculoskeletal: Negative for back pain, joint pain and myalgias.  Skin: Negative.   Neurological: Negative for dizziness.  Psychiatric/Behavioral: The patient is not nervous/anxious.     Physical Exam Constitutional:      General: She is not in acute distress.    Appearance: She is well-developed. She is not diaphoretic.  Neck:     Thyroid: No thyromegaly.  Cardiovascular:     Rate and Rhythm: Normal rate and regular rhythm.     Pulses: Normal pulses.     Heart sounds: Normal heart sounds.  Pulmonary:     Effort: Pulmonary effort is normal. No respiratory  distress.     Breath sounds: Normal breath sounds.     Comments: 02 dependent  Abdominal:     General: Bowel sounds are normal. There is no distension.     Palpations: Abdomen is soft.     Tenderness: There is no abdominal tenderness.  Musculoskeletal:     Cervical back: Neck supple.     Right lower leg: No edema.     Left lower leg: No edema.     Comments: Unable to move left upper extremity due to frozen left shoulder Able to move other extremities     Lymphadenopathy:     Cervical: No cervical adenopathy.  Skin:    General: Skin is warm and dry.  Neurological:     Mental Status: She is alert. Mental status is at baseline.  Psychiatric:        Mood and Affect: Mood normal.      ASSESSMENT/ PLAN:  TODAY:   1. Hypokalemia is stable k+ 3.9 will continue k+ 40 meq twice daily   2. Unspecified COPD/chronic respiratory failure with hypoxia: is stable is 02 dependent will continue trelegy ellipta 100-62.5-25 mcg 1 puff daily albuterol 2 puffs every 6 hours as needed.  3. GERD without esophagitis: is stable will continue protonix 40 mg twice daily   PREVIOUS   4. Vascular dementia without behavioral disturbance: without change weight is stable at 202  pounds; will continue aricept 5 mg daily  5. CKD stage 3 due to type 2 diabetes mellitus: is stable bun 21 creat 1.00 will monitor  6. Diabetic peripheral neuropathy associated with type 2 diabetes mellitus: is stable will continue lyrica 50 mg twice daily   7. Fibromyalgia: is stable will continue  lyrica  50 mg twice daily  8. Rheumatoid arteritis: is stable will continue methotrexate 12.5 mg weekly and folic acid 5 mg weekly.  9. Post-menopausal osteoporosis is without change: t score -4.997 (01-20-19) will continue prolia 60 mg every 6 months  10. Chronic constipation: is stable will continue miralax daily as needed  12. Type 2 diabetes controlled with peripheral neuropathy: is stable hgb a1c 5.9 is off tradjenta will  monitor   13. dyslipidemia associated with type 2 diabetes mellitus: is stable LDL 66 will continue crestor 20 mg daily  14. Atrial fibrillation with RVR: heart rate is stable will continue cartia xt 300 mg daily for rate control and eliquis 2.5 mg twice daily   15. Chronic diastolic heart failure EF 55-60% (07-28-16) is stable will continue lasix 40 mg daily with k+40 meq twice daily        MD is aware of resident's narcotic use and is in agreement with current plan of care. We will attempt to wean resident as appropriate.  Ok Edwards NP [facility]  Contact [phone] Monday through Friday 8am- 5pm  After hours call [phone]

## 2019-05-15 ENCOUNTER — Non-Acute Institutional Stay (SKILLED_NURSING_FACILITY): Payer: Medicare Other | Admitting: Adult Health

## 2019-05-15 ENCOUNTER — Encounter: Payer: Self-pay | Admitting: Adult Health

## 2019-05-15 DIAGNOSIS — J449 Chronic obstructive pulmonary disease, unspecified: Secondary | ICD-10-CM | POA: Diagnosis not present

## 2019-05-15 DIAGNOSIS — F0150 Vascular dementia without behavioral disturbance: Secondary | ICD-10-CM

## 2019-05-15 DIAGNOSIS — M0520 Rheumatoid vasculitis with rheumatoid arthritis of unspecified site: Secondary | ICD-10-CM

## 2019-05-15 DIAGNOSIS — I129 Hypertensive chronic kidney disease with stage 1 through stage 4 chronic kidney disease, or unspecified chronic kidney disease: Secondary | ICD-10-CM | POA: Diagnosis not present

## 2019-05-15 DIAGNOSIS — Z1159 Encounter for screening for other viral diseases: Secondary | ICD-10-CM | POA: Diagnosis not present

## 2019-05-15 DIAGNOSIS — I5032 Chronic diastolic (congestive) heart failure: Secondary | ICD-10-CM

## 2019-05-15 NOTE — Progress Notes (Signed)
Location:    [place] Room Number: 106/D Place of Service:  SNF (31)   CODE STATUS: DNR  Allergies  Allergen Reactions  . Other     Band Aids   . Tape   . Lipitor [Atorvastatin Calcium] Rash and Other (See Comments)    Muscle pain  . Niaspan [Niacin Er] Rash and Other (See Comments)    Muscle pain    Chief Complaint  Patient presents with  . Acute Visit    Care Plan Meeting    HPI:  We have come together for her care plan meeting. BIMS 9/15 mood 0/30. Has family present. No recent falls. Her weight is 207 she has had an 8 pound gain in the past 6 months. She requires assist with her adls. Her family reports that she has a cough; has left shoulder pain. Her family is concerned that she is off narcotic pain medications. They would like for her to see a dermatologist for her skin tags. She continues to be followed for her chronic illnesses including: cf; ra dementia.   Past Medical History:  Diagnosis Date  . Anemia   . Atrial fibrillation ([place])   . Back pain   . Bacterial pneumonia   . Cancer (HCC)    Skin   . Chronic respiratory failure ([place])   . Cognitive communication deficit   . COPD (chronic obstructive pulmonary disease) ([place])   . Dementia ([place])   . Dementia ([place])   . Depression   . Diabetes mellitus without complication ([place])   . Diverticulosis   . Diverticulosis   . Fatty liver   . Fibromyalgia   . Fibromyalgia   . Gastric polyp 09/08/11   at the anastomosis-inflammatory  . Generalized anxiety disorder   . Generalized anxiety disorder   . GERD (gastroesophageal reflux disease)   . GERD (gastroesophageal reflux disease)   . 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 Hypercholesteremia   . Hypertension   . Insomnia   . Internal hemorrhoids   . Muscle weakness   . Neuropathy   . On home O2    qhs  . Osteoporosis   . Rheumatoid arteritis ([place])   . Ulcer    stomach  . Vitamin D deficiency     Past Surgical History:    Procedure Laterality Date  . ABDOMINAL HYSTERECTOMY    . ABDOMINAL SURGERY     removed patial stomach from ulcer  . COLONOSCOPY  04-2001   internal hemorrhoids, panocolonic diverticulosis, and colon polyps   . UPPER GASTROINTESTINAL ENDOSCOPY      Social History   Socioeconomic History  . Marital status: Widowed    Spouse name: Not on file  . Number of children: 6  . Years of education: Not on file  . Highest education level: Not on file  Occupational History  . Occupation: Retired  Tobacco Use  . Smoking status: Never Smoker  . Smokeless tobacco: Never Used  Substance and Sexual Activity  . Alcohol use: No  . Drug use: No  . Sexual activity: Not Currently  Other Topics Concern  . Not on file  Social History Narrative   Long term resident of [facility]    Social Determinants of Health   Financial Resource Strain:   . Difficulty of Paying Living Expenses: Not on file  Food Insecurity:   . Worried About Charity fundraiser in the Last Year: Not on file  . Ran Out of Food in the Last Year:  Not on file  Transportation Needs:   . Lack of Transportation (Medical): Not on file  . Lack of Transportation (Non-Medical): Not on file  Physical Activity:   . Days of Exercise per Week: Not on file  . Minutes of Exercise per Session: Not on file  Stress:   . Feeling of Stress : Not on file  Social Connections: Unknown  . Frequency of Communication with Friends and Family: Never  . Frequency of Social Gatherings with Friends and Family: Never  . Attends Religious Services: Not on file  . Active Member of Clubs or Organizations: Not on file  . Attends Archivist Meetings: Not on file  . Marital Status: Not on file  Intimate Partner Violence:   . Fear of Current or Ex-Partner: Not on file  . Emotionally Abused: Not on file  . Physically Abused: Not on file  . Sexually Abused: Not on file   Family History  Problem Relation Age of Onset  . Heart disease Father   . Kidney  disease Mother        kidney cancer  . Colon cancer Daughter 56      VITAL SIGNS BP 123/78   Pulse 68   Temp 97.7 F (36.5 C) (Oral)   Resp (!) 24   Ht 5\' 3"  (1.6 m)   Wt 205 lb 3.2 oz (93.1 kg)   SpO2 99%   BMI 36.35 kg/m   Outpatient Encounter Medications as of 05/15/2019  Medication Sig  . acetaminophen (TYLENOL) 325 MG tablet Take 650 mg by mouth every 8 (eight) hours as needed. do not exceed 3g/24 hours of apap considering all sources  . apixaban (ELIQUIS) 2.5 MG TABS tablet Take 2.5 mg by mouth 2 (two) times daily.  Marland Kitchen CARTIA XT 300 MG 24 hr capsule TAKE ONE CAPSULE BY MOUTH EVERY DAY  . denosumab (PROLIA) 60 MG/ML SOSY injection Inject 60 mg into the skin every 6 (six) months.  . donepezil (ARICEPT) 5 MG tablet Take 5 mg by mouth at bedtime.  . DULoxetine (CYMBALTA) 30 MG capsule Take 30 mg by mouth daily.  . Fluticasone-Umeclidin-Vilant (TRELEGY ELLIPTA) 100-62.5-25 MCG/INH AEPB One puff inhalation once a day   . Folic Acid 5 MG CAPS Take 5 mg by mouth every Sunday.  . furosemide (LASIX) 40 MG tablet Take 40 mg by mouth daily.  Marland Kitchen guaifenesin (ROBITUSSIN) 100 MG/5ML syrup Take 200 mg by mouth 4 (four) times daily as needed for cough.  . Menthol, Topical Analgesic, (BIOFREEZE) 4 % GEL Apply to both knees  daily as needed  . Menthol, Topical Analgesic, (BIOFREEZE) 4 % GEL Apply to left and right shoulder Once A Day  . methotrexate (RHEUMATREX) 2.5 MG tablet Take 12.5 mg by mouth once a week. On Sunday - Caution:Chemotherapy. Protect from light.  . NON FORMULARY Diet Type:  NAS, Regular  Consistent Carbohydrates, Dysphagia 3  . OXYGEN Inhale 3.5 L/min into the lungs continuous.  . pantoprazole (PROTONIX) 40 MG tablet Take 40 mg by mouth 2 (two) times daily.  . phenylephrine-shark liver oil-mineral oil-petrolatum (PREPARATION H) 0.25-3-14-71.9 % rectal ointment Place 1 application rectally 2 (two) times daily as needed for hemorrhoids.  . polyethylene glycol (MIRALAX / GLYCOLAX)  packet Take 17 g by mouth daily as needed for moderate constipation.  . potassium chloride SA (K-DUR,KLOR-CON) 20 MEQ tablet Take 2 tablets (40 mg ) by mouth twice a day  . pregabalin (LYRICA) 50 MG capsule Take 1 capsule by mouth  Twice  daily  . rosuvastatin (CRESTOR) 20 MG tablet Take 20 mg by mouth daily.   No facility-administered encounter medications on file as of 05/15/2019.     SIGNIFICANT DIAGNOSTIC EXAMS   PREVIOUS;   01-07-19: chest x-ray; COPD. Aortic atherosclerosis no acute pathology    01-20-19: DEXA: t score -4.997  NO NEW EXAMS.    LABS REVIEWED PREVIOUS :   06-01-18: hgb a1c 5.9; urine micro-albumin <3.0 09-26-18: wbc 8.3; hgb 16.2; hct 52.3 mcv 93.7; plt 156 11-02-18: glucose 117; bun 16; creat 1.02; k+ 4.2; na++ 142; ca 9.6 ;liver normal albumin 3.5; hgb a1c 5.8; urine micro-albumin 4.3   03-20-19: wbc 4.8; hgb 13.9; hct 44.9; mcv 95.7 plt 97; glucose 168; bun 21; creat 1.00 ;k+ 3.9; na++ 138; ca 9.5 liver normal albumin 3.2  03-27-19: wbc 6.1; hgb 15.1; hct 48.1; mcv 94.9 plt 121   NO NEW LABS.    Review of Systems  Constitutional: Negative for malaise/fatigue.  Respiratory: Negative for cough and shortness of breath.   Cardiovascular: Negative for chest pain, palpitations and leg swelling.  Gastrointestinal: Negative for abdominal pain, constipation and heartburn.  Musculoskeletal: Negative for back pain, joint pain and myalgias.  Skin: Negative.   Neurological: Negative for dizziness.  Psychiatric/Behavioral: The patient is not nervous/anxious.     Physical Exam Constitutional:      General: She is not in acute distress.    Appearance: She is well-developed. She is not diaphoretic.  Neck:     Thyroid: No thyromegaly.  Cardiovascular:     Rate and Rhythm: Normal rate and regular rhythm.     Pulses: Normal pulses.     Heart sounds: Normal heart sounds.  Pulmonary:     Effort: Pulmonary effort is normal. No respiratory distress.     Breath sounds:  Normal breath sounds.     Comments: 02 dependent  Abdominal:     General: Bowel sounds are normal. There is no distension.     Palpations: Abdomen is soft.     Tenderness: There is no abdominal tenderness.  Musculoskeletal:     Cervical back: Neck supple.     Right lower leg: No edema.     Left lower leg: No edema.     Comments: Unable to move left upper extremity due to frozen left shoulder Able to move other extremities      Lymphadenopathy:     Cervical: No cervical adenopathy.  Skin:    General: Skin is warm and dry.  Neurological:     Mental Status: She is alert. Mental status is at baseline.  Psychiatric:        Mood and Affect: Mood normal.       ASSESSMENT/ PLAN:  TODAY  1. Chronic diastolic congestive heart failure 2. Vascular dementia without behavioral disturbance 3. Rheumatoid arteritis  For her cough will begin musinex 600 mg twice daily  For her left shoulder pain will begin tylenol xr 650 mg every 6 hours and ultram 50 mg twice daily  Will x-ray left shoulder Will have her see dermatology for her skin tags.        MD is aware of resident's narcotic use and is in agreement with current plan of care. We will attempt to wean resident as appropriate.  Ok Edwards NP [facility]  Contact [phone] Monday through Friday 8am- 5pm  After hours call [phone]

## 2019-05-16 DIAGNOSIS — M25512 Pain in left shoulder: Secondary | ICD-10-CM | POA: Diagnosis not present

## 2019-05-17 ENCOUNTER — Other Ambulatory Visit (HOSPITAL_COMMUNITY)
Admission: RE | Admit: 2019-05-17 | Discharge: 2019-05-17 | Disposition: A | Discharge status: Home / Self Care | Payer: Medicare Other | Source: Skilled Nursing Facility | Attending: Adult Health | Admitting: Adult Health

## 2019-05-17 ENCOUNTER — Other Ambulatory Visit: Payer: Self-pay | Admitting: Adult Health

## 2019-05-17 DIAGNOSIS — I130 Hypertensive heart and chronic kidney disease with heart failure and stage 1 through stage 4 chronic kidney disease, or unspecified chronic kidney disease: Secondary | ICD-10-CM | POA: Diagnosis not present

## 2019-05-17 DIAGNOSIS — N189 Chronic kidney disease, unspecified: Secondary | ICD-10-CM | POA: Diagnosis not present

## 2019-05-17 DIAGNOSIS — I509 Heart failure, unspecified: Secondary | ICD-10-CM | POA: Diagnosis not present

## 2019-05-17 LAB — HEMOGLOBIN A1C
Hgb A1c MFr Bld: 6.4 % — ABNORMAL HIGH (ref 4.8–5.6)
Mean Plasma Glucose: 136.98 mg/dL

## 2019-05-17 LAB — LIPID PANEL
Cholesterol: 130 mg/dL (ref 0–200)
HDL: 42 mg/dL (ref >40)
LDL Cholesterol: 68 mg/dL (ref 0–99)
Total CHOL/HDL Ratio: 3.1 RATIO
Triglycerides: 102 mg/dL (ref <150)
VLDL: 20 mg/dL (ref 0–40)

## 2019-05-17 LAB — COMPREHENSIVE METABOLIC PANEL
ALT: 30 U/L (ref 0–44)
AST: 36 U/L (ref 15–41)
Albumin: 3.3 g/dL — ABNORMAL LOW (ref 3.5–5.0)
Alkaline Phosphatase: 52 U/L (ref 38–126)
Anion gap: 7 (ref 5–15)
BUN: 18 mg/dL (ref 8–23)
CO2: 28 mmol/L (ref 22–32)
Calcium: 9.9 mg/dL (ref 8.9–10.3)
Chloride: 105 mmol/L (ref 98–111)
Creatinine, Ser: 0.94 mg/dL (ref 0.44–1.00)
GFR calc Af Amer: >60 mL/min (ref >60)
GFR calc non Af Amer: 54 mL/min — ABNORMAL LOW (ref >60)
Glucose, Bld: 134 mg/dL — ABNORMAL HIGH (ref 70–99)
Potassium: 3.9 mmol/L (ref 3.5–5.1)
Sodium: 140 mmol/L (ref 135–145)
Total Bilirubin: 0.6 mg/dL (ref 0.3–1.2)
Total Protein: 6.4 g/dL — ABNORMAL LOW (ref 6.5–8.1)

## 2019-05-17 LAB — CBC
HCT: 45.8 % (ref 36.0–46.0)
Hemoglobin: 14.1 g/dL (ref 12.0–15.0)
MCH: 28.5 pg (ref 26.0–34.0)
MCHC: 30.8 g/dL (ref 30.0–36.0)
MCV: 92.5 fL (ref 80.0–100.0)
Platelets: 112 10*3/uL — ABNORMAL LOW (ref 150–400)
RBC: 4.95 MIL/uL (ref 3.87–5.11)
RDW: 14.3 % (ref 11.5–15.5)
WBC: 5.9 10*3/uL (ref 4.0–10.5)
nRBC: 0 % (ref 0.0–0.2)

## 2019-05-17 MED ORDER — TRAMADOL HCL 50 MG PO TABS: 50.0000 mg | ORAL_TABLET | Freq: Two times a day (BID) | ORAL | 0 refills | Status: DC

## 2019-05-22 ENCOUNTER — Non-Acute Institutional Stay: Payer: Self-pay | Admitting: Adult Health

## 2019-05-22 DIAGNOSIS — G629 Polyneuropathy, unspecified: Secondary | ICD-10-CM | POA: Diagnosis not present

## 2019-05-22 DIAGNOSIS — M0520 Rheumatoid vasculitis with rheumatoid arthritis of unspecified site: Secondary | ICD-10-CM | POA: Diagnosis not present

## 2019-05-22 DIAGNOSIS — F339 Major depressive disorder, recurrent, unspecified: Secondary | ICD-10-CM | POA: Diagnosis not present

## 2019-05-23 DIAGNOSIS — J449 Chronic obstructive pulmonary disease, unspecified: Secondary | ICD-10-CM | POA: Diagnosis not present

## 2019-05-23 DIAGNOSIS — Z1159 Encounter for screening for other viral diseases: Secondary | ICD-10-CM | POA: Diagnosis not present

## 2019-05-23 DIAGNOSIS — I129 Hypertensive chronic kidney disease with stage 1 through stage 4 chronic kidney disease, or unspecified chronic kidney disease: Secondary | ICD-10-CM | POA: Diagnosis not present

## 2019-05-27 ENCOUNTER — Non-Acute Institutional Stay (SKILLED_NURSING_FACILITY): Payer: Medicare Other | Admitting: Adult Health

## 2019-05-27 ENCOUNTER — Encounter: Payer: Self-pay | Admitting: Adult Health

## 2019-05-27 DIAGNOSIS — Z0000 Encounter for general adult medical examination without abnormal findings: Secondary | ICD-10-CM | POA: Diagnosis not present

## 2019-05-27 NOTE — Progress Notes (Signed)
Location:   [facility] Room Number: 111 Place of Service:  SNF (31)   CODE STATUS: dnr  Allergies  Allergen Reactions  . Other     Band Aids   . Tape   . Lipitor [Atorvastatin Calcium] Rash and Other (See Comments)    Muscle pain  . Niaspan [Niacin Er] Rash and Other (See Comments)    Muscle pain    Chief Complaint  Patient presents with  . Acute Visit    medication review     HPI:  She is presently taking ultram 50 mg twice daily cymbalta 30 mg daily lyrica 50 mg twice daily to manage her depression; peripheral neuropathy and RA. There are no reports of changes in appetite; no reports of anxiety or agitation or depressive thoughts. No reports of uncontrolled pain. It is felt by the care plan team that she will suffer psychologically if her medications are lowered at this time.   Past Medical History:  Diagnosis Date  . Anemia   . Atrial fibrillation ([place])   . Back pain   . Bacterial pneumonia   . Cancer (HCC)    Skin   . Chronic respiratory failure ([place])   . Cognitive communication deficit   . COPD (chronic obstructive pulmonary disease) ([place])   . Dementia ([place])   . Dementia ([place])   . Depression   . Diabetes mellitus without complication ([place])   . Diverticulosis   . Diverticulosis   . Fatty liver   . Fibromyalgia   . Fibromyalgia   . Gastric polyp 09/08/11   at the anastomosis-inflammatory  . Generalized anxiety disorder   . Generalized anxiety disorder   . GERD (gastroesophageal reflux disease)   . GERD (gastroesophageal reflux disease)   . 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 Hypercholesteremia   . Hypertension   . Insomnia   . Internal hemorrhoids   . Muscle weakness   . Neuropathy   . On home O2    qhs  . Osteoporosis   . Rheumatoid arteritis ([place])   . Ulcer    stomach  . Vitamin D deficiency     Past Surgical History:  Procedure Laterality Date  . ABDOMINAL HYSTERECTOMY    . ABDOMINAL SURGERY     removed patial stomach  from ulcer  . COLONOSCOPY  04-2001   internal hemorrhoids, panocolonic diverticulosis, and colon polyps   . UPPER GASTROINTESTINAL ENDOSCOPY      Social History   Socioeconomic History  . Marital status: Widowed    Spouse name: Not on file  . Number of children: 6  . Years of education: Not on file  . Highest education level: Not on file  Occupational History  . Occupation: Retired  Tobacco Use  . Smoking status: Never Smoker  . Smokeless tobacco: Never Used  Substance and Sexual Activity  . Alcohol use: No  . Drug use: No  . Sexual activity: Not Currently  Other Topics Concern  . Not on file  Social History Narrative   Long term resident of [facility]    Social Determinants of Health   Financial Resource Strain:   . Difficulty of Paying Living Expenses:   Food Insecurity:   . Worried About Charity fundraiser in the Last Year:   . Arboriculturist in the Last Year:   Transportation Needs:   . Film/video editor (Medical):   Marland Kitchen Lack of Transportation (Non-Medical):   Physical Activity:   . Days of  Exercise per Week:   . Minutes of Exercise per Session:   Stress:   . Feeling of Stress :   Social Connections: Unknown  . Frequency of Communication with Friends and Family: Never  . Frequency of Social Gatherings with Friends and Family: Never  . Attends Religious Services: Not on file  . Active Member of Clubs or Organizations: Not on file  . Attends Archivist Meetings: Not on file  . Marital Status: Not on file  Intimate Partner Violence:   . Fear of Current or Ex-Partner:   . Emotionally Abused:   Marland Kitchen Physically Abused:   . Sexually Abused:    Family History  Problem Relation Age of Onset  . Heart disease Father   . Kidney disease Mother        kidney cancer  . Colon cancer Daughter 66      VITAL SIGNS BP (!) 149/80   Pulse 80   Temp (!) 97.2 F (36.2 C)   Ht 5\' 3"  (1.6 m)   Wt 205 lb (93 kg)   BMI 36.31 kg/m   Outpatient Encounter  Medications as of 05/22/2019  Medication Sig  . acetaminophen (TYLENOL) 325 MG tablet Take 650 mg by mouth every 8 (eight) hours as needed. do not exceed 3g/24 hours of apap considering all sources  . apixaban (ELIQUIS) 2.5 MG TABS tablet Take 2.5 mg by mouth 2 (two) times daily.  [facility] Kitchen CARTIA XT 300 MG 24 hr capsule TAKE ONE CAPSULE BY MOUTH EVERY DAY  . denosumab (PROLIA) 60 MG/ML SOSY injection Inject 60 mg into the skin every 6 (six) months.  . donepezil (ARICEPT) 5 MG tablet Take 5 mg by mouth at bedtime.  . DULoxetine (CYMBALTA) 30 MG capsule Take 30 mg by mouth daily.  . Fluticasone-Umeclidin-Vilant (TRELEGY ELLIPTA) 100-62.5-25 MCG/INH AEPB One puff inhalation once a day   . Folic Acid 5 MG CAPS Take 5 mg by mouth every Sunday.  . furosemide (LASIX) 40 MG tablet Take 40 mg by mouth daily.  [facility] Kitchen guaifenesin (ROBITUSSIN) 100 MG/5ML syrup Take 200 mg by mouth 4 (four) times daily as needed for cough.  . Menthol, Topical Analgesic, (BIOFREEZE) 4 % GEL Apply to both knees  daily as needed  . Menthol, Topical Analgesic, (BIOFREEZE) 4 % GEL Apply to left and right shoulder Once A Day  . methotrexate (RHEUMATREX) 2.5 MG tablet Take 12.5 mg by mouth once a week. On Sunday - Caution:Chemotherapy. Protect from light.  . NON FORMULARY Diet Type:  NAS, Regular  Consistent Carbohydrates, Dysphagia 3  . OXYGEN Inhale 3.5 L/min into the lungs continuous.  . pantoprazole (PROTONIX) 40 MG tablet Take 40 mg by mouth 2 (two) times daily.  . phenylephrine-shark liver oil-mineral oil-petrolatum (PREPARATION H) 0.25-3-14-71.9 % rectal ointment Place 1 application rectally 2 (two) times daily as needed for hemorrhoids.  . polyethylene glycol (MIRALAX / GLYCOLAX) packet Take 17 g by mouth daily as needed for moderate constipation.  . potassium chloride SA (K-DUR,KLOR-CON) 20 MEQ tablet Take 2 tablets (40 mg ) by mouth twice a day  . pregabalin (LYRICA) 50 MG capsule Take 1 capsule by mouth  Twice daily  . rosuvastatin  (CRESTOR) 20 MG tablet Take 20 mg by mouth daily.  . traMADol (ULTRAM) 50 MG tablet Take 1 tablet (50 mg total) by mouth 2 (two) times daily.   No facility-administered encounter medications on file as of 05/22/2019.     SIGNIFICANT DIAGNOSTIC EXAMS  PREVIOUS;   01-07-19: chest  x-ray; COPD. Aortic atherosclerosis no acute pathology    01-20-19: DEXA: t score -4.997  NO NEW EXAMS.    LABS REVIEWED PREVIOUS :   06-01-18: hgb a1c 5.9; urine micro-albumin <3.0 09-26-18: wbc 8.3; hgb 16.2; hct 52.3 mcv 93.7; plt 156 11-02-18: glucose 117; bun 16; creat 1.02; k+ 4.2; na++ 142; ca 9.6 ;liver normal albumin 3.5; hgb a1c 5.8; urine micro-albumin 4.3   03-20-19: wbc 4.8; hgb 13.9; hct 44.9; mcv 95.7 plt 97; glucose 168; bun 21; creat 1.00 ;k+ 3.9; na++ 138; ca 9.5 liver normal albumin 3.2  03-27-19: wbc 6.1; hgb 15.1; hct 48.1; mcv 94.9 plt 121   NO NEW LABS.   Review of Systems  Constitutional: Negative for malaise/fatigue.  Respiratory: Negative for cough and shortness of breath.   Cardiovascular: Negative for chest pain, palpitations and leg swelling.  Gastrointestinal: Negative for abdominal pain, constipation and heartburn.  Musculoskeletal: Negative for back pain, joint pain and myalgias.  Skin: Negative.   Neurological: Negative for dizziness.  Psychiatric/Behavioral: The patient is not nervous/anxious.     Physical Exam Constitutional:      General: She is not in acute distress.    Appearance: She is well-developed. She is not diaphoretic.  Neck:     Thyroid: No thyromegaly.  Cardiovascular:     Rate and Rhythm: Normal rate and regular rhythm.     Pulses: Normal pulses.     Heart sounds: Normal heart sounds.  Pulmonary:     Effort: Pulmonary effort is normal. No respiratory distress.     Breath sounds: Normal breath sounds.     Comments: 02 dependent  Abdominal:     General: Bowel sounds are normal. There is no distension.     Palpations: Abdomen is soft.     Tenderness:  There is no abdominal tenderness.  Musculoskeletal:     Cervical back: Neck supple.     Right lower leg: No edema.     Left lower leg: No edema.     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  Skin:    General: Skin is warm and dry.  Neurological:     Mental Status: She is alert. Mental status is at baseline.  Psychiatric:        Mood and Affect: Mood normal.        ASSESSMENT/ PLAN:   TODAY  1. Major depression recurrent chronic  2. Peripheral polyneuropathy 3. Rheumatoid arteritis   Is stable will continue her current medications: ultram 50 mg twice daily cymbalta 30 mg daily lyrica 50 mg twice daily will monitor her status.    MD is aware of resident's narcotic use and is in agreement with current plan of care. We will attempt to wean resident as appropriate.  Ok Edwards NP [facility]  Contact [phone] Monday through Friday 8am- 5pm  After hours call [phone]

## 2019-05-27 NOTE — Progress Notes (Signed)
Subjective:   [name] is a 86 y.o. female who presents for Medicare Annual (Subsequent) preventive examination.  Review of Systems:  Review of Systems  Constitutional: Negative for malaise/fatigue.  Respiratory: Negative for cough and shortness of breath.   Cardiovascular: Negative for chest pain, palpitations and leg swelling.  Gastrointestinal: Negative for abdominal pain, constipation and heartburn.  Musculoskeletal: Negative for back pain, joint pain and myalgias.  Skin: Negative.   Neurological: Negative for dizziness.  Psychiatric/Behavioral: The patient is not nervous/anxious.     Cardiac Risk Factors include: advanced age (>62men, >44 women);diabetes mellitus;obesity (BMI >30kg/m2);sedentary lifestyle     Objective:     Vitals: BP (!) 171/92   Pulse 61   Temp (!) 97.2 F (36.2 C) (Oral)   Resp (!) 24   Ht 5\' 3"  (1.6 m)   Wt 205 lb 3.2 oz (93.1 kg)   SpO2 95%   BMI 36.35 kg/m   Body mass index is 36.35 kg/m.  Advanced Directives 05/27/2019 04/10/2019 03/14/2019 03/05/2019 02/05/2019 02/01/2019 01/22/2019  Does Patient Have a Medical Advance Directive? Yes Yes Yes Yes Yes Yes Yes  Type of Paramedic of [place];Living will;Out of facility DNR (pink MOST or yellow form) [place];Living will;Out of facility DNR (pink MOST or yellow form) [place];Out of facility DNR (pink MOST or yellow form);Living will Out of facility DNR (pink MOST or yellow form);Living will Living will;Healthcare Power of [place];Out of facility DNR (pink MOST or yellow form) Out of facility DNR (pink MOST or yellow form) Out of facility DNR (pink MOST or yellow form);Living will;Healthcare Power of Attorney  Does patient want to make changes to medical advance directive? No - Patient declined No - Patient declined No - Patient declined No - Patient declined No - Patient declined No - Patient declined No - Patient declined    Copy of [place] in Chart? Yes - validated most recent copy scanned in chart (See row information) Yes - validated most recent copy scanned in chart (See row information) Yes - validated most recent copy scanned in chart (See row information) - Yes - validated most recent copy scanned in chart (See row information) - Yes - validated most recent copy scanned in chart (See row information)  Would patient like information on creating a medical advance directive? - - - - - - -  Pre-existing out of facility DNR order (yellow form or pink MOST form) Yellow form placed in chart (order not valid for inpatient use) Yellow form placed in chart (order not valid for inpatient use) Yellow form placed in chart (order not valid for inpatient use) - Yellow form placed in chart (order not valid for inpatient use) - Yellow form placed in chart (order not valid for inpatient use)    Tobacco Social History   Tobacco Use  Smoking Status Never Smoker  Smokeless Tobacco Never Used     Counseling given: Not Answered   Clinical Intake:      Past Medical History:  Diagnosis Date  . Anemia   . Atrial fibrillation ([place])   . Back pain   . Bacterial pneumonia   . Cancer (HCC)    Skin   . Chronic respiratory failure ([place])   . Cognitive communication deficit   . COPD (chronic obstructive pulmonary disease) ([place])   . Dementia ([place])   . Dementia ([place])   . Depression   . Diabetes mellitus without complication ([place])   .  Diverticulosis   . Diverticulosis   . Fatty liver   . Fibromyalgia   . Fibromyalgia   . Gastric polyp 09/08/11   at the anastomosis-inflammatory  . Generalized anxiety disorder   . Generalized anxiety disorder   . GERD (gastroesophageal reflux disease)   . GERD (gastroesophageal reflux disease)   . 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 Hypercholesteremia   . Hypertension   . Insomnia   . Internal hemorrhoids   . Muscle weakness   . Neuropathy   . On home O2     qhs  . Osteoporosis   . Rheumatoid arteritis ([place])   . Ulcer    stomach  . Vitamin D deficiency    Past Surgical History:  Procedure Laterality Date  . ABDOMINAL HYSTERECTOMY    . ABDOMINAL SURGERY     removed patial stomach from ulcer  . COLONOSCOPY  04-2001   internal hemorrhoids, panocolonic diverticulosis, and colon polyps   . UPPER GASTROINTESTINAL ENDOSCOPY     Family History  Problem Relation Age of Onset  . Heart disease Father   . Kidney disease Mother        kidney cancer  . Colon cancer Daughter 86   Social History   Socioeconomic History  . Marital status: Widowed    Spouse name: Not on file  . Number of children: 6  . Years of education: Not on file  . Highest education level: Not on file  Occupational History  . Occupation: Retired  Tobacco Use  . Smoking status: Never Smoker  . Smokeless tobacco: Never Used  Substance and Sexual Activity  . Alcohol use: No  . Drug use: No  . Sexual activity: Not Currently  Other Topics Concern  . Not on file  Social History Narrative   Long term resident of [facility]    Social Determinants of Health   Financial Resource Strain:   . Difficulty of Paying Living Expenses:   Food Insecurity:   . Worried About Charity fundraiser in the Last Year:   . Arboriculturist in the Last Year:   Transportation Needs:   . Film/video editor (Medical):   Marland Kitchen Lack of Transportation (Non-Medical):   Physical Activity:   . Days of Exercise per Week:   . Minutes of Exercise per Session:   Stress:   . Feeling of Stress :   Social Connections: Unknown  . Frequency of Communication with Friends and Family: Never  . Frequency of Social Gatherings with Friends and Family: Never  . Attends Religious Services: Not on file  . Active Member of Clubs or Organizations: Not on file  . Attends Archivist Meetings: Not on file  . Marital Status: Not on file    Outpatient Encounter Medications as of 05/27/2019  Medication Sig   . acetaminophen (TYLENOL) 325 MG tablet Take 650 mg by mouth every 8 (eight) hours as needed. do not exceed 3g/24 hours of apap considering all sources  . apixaban (ELIQUIS) 2.5 MG TABS tablet Take 2.5 mg by mouth 2 (two) times daily.  Marland Kitchen CARTIA XT 300 MG 24 hr capsule TAKE ONE CAPSULE BY MOUTH EVERY DAY  . denosumab (PROLIA) 60 MG/ML SOSY injection Inject 60 mg into the skin every 6 (six) months.  . donepezil (ARICEPT) 5 MG tablet Take 5 mg by mouth at bedtime.  . DULoxetine (CYMBALTA) 30 MG capsule Take 30 mg by mouth daily.  . Fluticasone-Umeclidin-Vilant (TRELEGY ELLIPTA) 100-62.5-25 MCG/INH AEPB One puff inhalation  once a day   . Folic Acid 5 MG CAPS Take 5 mg by mouth every Sunday.  . furosemide (LASIX) 40 MG tablet Take 40 mg by mouth daily.  Marland Kitchen guaifenesin (ROBITUSSIN) 100 MG/5ML syrup Take 200 mg by mouth 4 (four) times daily as needed for cough.  . Menthol, Topical Analgesic, (BIOFREEZE) 4 % GEL Apply to both knees  daily as needed  . Menthol, Topical Analgesic, (BIOFREEZE) 4 % GEL Apply to left and right shoulder Once A Day  . methotrexate (RHEUMATREX) 2.5 MG tablet Take 12.5 mg by mouth once a week. On Sunday - Caution:Chemotherapy. Protect from light.  . NON FORMULARY Diet Type:  NAS, Regular  Consistent Carbohydrates, Dysphagia 3  . OXYGEN Inhale 3.5 L/min into the lungs continuous.  . pantoprazole (PROTONIX) 40 MG tablet Take 40 mg by mouth 2 (two) times daily.  . phenylephrine-shark liver oil-mineral oil-petrolatum (PREPARATION H) 0.25-3-14-71.9 % rectal ointment Place 1 application rectally 2 (two) times daily as needed for hemorrhoids.  . polyethylene glycol (MIRALAX / GLYCOLAX) packet Take 17 g by mouth daily as needed for moderate constipation.  . potassium chloride SA (K-DUR,KLOR-CON) 20 MEQ tablet Take 2 tablets (40 mg ) by mouth twice a day  . pregabalin (LYRICA) 50 MG capsule Take 1 capsule by mouth  Twice daily  . rosuvastatin (CRESTOR) 20 MG tablet Take 20 mg by mouth  daily.  . traMADol (ULTRAM) 50 MG tablet Take 1 tablet (50 mg total) by mouth 2 (two) times daily.   No facility-administered encounter medications on file as of 05/27/2019.    Activities of Daily Living In your present state of health, do you have any difficulty performing the following activities: 05/27/2019 05/27/2019  Hearing? Tempie Donning  Vision? N N  Difficulty concentrating or making decisions? Tempie Donning  Walking or climbing stairs? Y Y  Dressing or bathing? Y Y  Doing errands, shopping? Tempie Donning  Preparing Food and eating ? Y Y  Using the Toilet? Y Y  In the past six months, have you accidently leaked urine? Y Y  Do you have problems with loss of bowel control? Y Y  Managing your Medications? Y Y  Managing your Finances? Tempie Donning  Housekeeping or managing your Housekeeping? - Y  Some recent data might be hidden    Patient Care Team: [name], MD as PCP - General (Internal Medicine) Nyoka Cowden Phylis Bougie, NP as Nurse Practitioner ([place] [address] ([place])    Assessment:   This is a routine wellness examination for [name].  Exercise Activities and Dietary recommendations Current Exercise Habits: The patient does not participate in regular exercise at present, Exercise limited by: respiratory conditions(s)  Goals   None     Fall Risk Fall Risk  05/27/2019 02/01/2019 10/10/2018 08/15/2017  Falls in the past year? 0 0 (No Data) No  Comment - - Emmi Telephone Survey: data to providers prior to load -  Number falls in past yr: 0 0 (No Data) -  Comment - - Emmi Telephone Survey Actual Response =  -   Is the patient's home free of loose throw rugs in walkways, pet beds, electrical cords, etc?   yes      Grab bars in the bathroom? yes      Handrails on the stairs?  n/a      Adequate lighting?   yes  Timed Get Up and Go performed: patient non-ambulatory   Depression Screen PHQ 2/9 Scores 05/27/2019  02/01/2019 08/15/2017  PHQ - 2 Score 0 1 0      Cognitive Function     6CIT Screen 05/27/2019 08/15/2017  What Year? 4 points 4 points  What month? 3 points 3 points  What time? 0 points 0 points  Count back from 20 4 points 0 points  Months in reverse 4 points 4 points  Repeat phrase 6 points 6 points  Total Score 21 17    Immunization History  Administered Date(s) Administered  . Influenza-Unspecified 12/17/2018  . Pneumococcal Conjugate-13 12/17/2018  . Tdap 10/21/2016    Qualifies for Shingles Vaccine?long term resident of [facility]  Screening Tests Health Maintenance  Topic Date Due  . OPHTHALMOLOGY EXAM  01/09/2019  . FOOT EXAM  05/31/2019  . URINE MICROALBUMIN  11/02/2019  . HEMOGLOBIN A1C  11/17/2019  . TETANUS/TDAP  10/22/2026  . INFLUENZA VACCINE  Completed  . DEXA SCAN  Discontinued  . PNA vac Low Risk Adult  Discontinued    Cancer Screenings: Lung: Low Dose CT Chest recommended if Age 30-80 years, 30 pack-year currently smoking OR have quit w/in 15years. Patient not qualify. Breast:  Up to date on Mammogram? n/a Up to date of Bone Density/Dexa? yes Colorectal: yes  Additional Screenings:  Hepatitis C Screening:      Plan:     I have personally reviewed and noted the following in the patient's chart:   . Medical and social history . Use of alcohol, tobacco or illicit drugs  . Current medications and supplements . Functional ability and status . Nutritional status . Physical activity . Advanced directives . List of other physicians . Hospitalizations, surgeries, and ER visits in previous 12 months . Vitals . Screenings to include cognitive, depression, and falls . Referrals and appointments  In addition, I have reviewed and discussed with patient certain preventive protocols, quality metrics, and best practice recommendations. A written personalized care plan for preventive services as well as general preventive health recommendations were provided to patient.     Gerlene Fee,  NP  05/27/2019

## 2019-05-27 NOTE — Patient Instructions (Signed)
  [name] , Thank you for taking time to come for your Medicare Wellness Visit. I appreciate your ongoing commitment to your health goals. Please review the following plan we discussed and let me know if I can assist you in the future.   These are the goals we discussed: Goals   None     This is a list of the screening recommended for you and due dates:  Health Maintenance  Topic Date Due  . Eye exam for diabetics  01/09/2019  . Complete foot exam   05/31/2019  . Urine Protein Check  11/02/2019  . Hemoglobin A1C  11/17/2019  . Tetanus Vaccine  10/22/2026  . Flu Shot  Completed  . DEXA scan (bone density measurement)  Discontinued  . Pneumonia vaccines  Discontinued

## 2019-05-29 DIAGNOSIS — J449 Chronic obstructive pulmonary disease, unspecified: Secondary | ICD-10-CM | POA: Diagnosis not present

## 2019-05-29 DIAGNOSIS — I129 Hypertensive chronic kidney disease with stage 1 through stage 4 chronic kidney disease, or unspecified chronic kidney disease: Secondary | ICD-10-CM | POA: Diagnosis not present

## 2019-05-29 DIAGNOSIS — Z1159 Encounter for screening for other viral diseases: Secondary | ICD-10-CM | POA: Diagnosis not present

## 2019-06-03 ENCOUNTER — Other Ambulatory Visit: Payer: Self-pay | Admitting: Adult Health

## 2019-06-03 MED ORDER — PREGABALIN 50 MG PO CAPS: ORAL_CAPSULE | ORAL | 0 refills | Status: DC

## 2019-06-04 ENCOUNTER — Encounter: Payer: Self-pay | Admitting: Adult Health

## 2019-06-04 ENCOUNTER — Non-Acute Institutional Stay (SKILLED_NURSING_FACILITY): Payer: Medicare Other | Admitting: Adult Health

## 2019-06-04 DIAGNOSIS — I4891 Unspecified atrial fibrillation: Secondary | ICD-10-CM

## 2019-06-04 DIAGNOSIS — I5032 Chronic diastolic (congestive) heart failure: Secondary | ICD-10-CM | POA: Diagnosis not present

## 2019-06-04 DIAGNOSIS — N183 Chronic kidney disease, stage 3 unspecified: Secondary | ICD-10-CM

## 2019-06-04 DIAGNOSIS — J449 Chronic obstructive pulmonary disease, unspecified: Secondary | ICD-10-CM

## 2019-06-04 DIAGNOSIS — M0520 Rheumatoid vasculitis with rheumatoid arthritis of unspecified site: Secondary | ICD-10-CM | POA: Diagnosis not present

## 2019-06-04 DIAGNOSIS — J9611 Chronic respiratory failure with hypoxia: Secondary | ICD-10-CM

## 2019-06-04 DIAGNOSIS — E1122 Type 2 diabetes mellitus with diabetic chronic kidney disease: Secondary | ICD-10-CM

## 2019-06-04 DIAGNOSIS — F0150 Vascular dementia without behavioral disturbance: Secondary | ICD-10-CM | POA: Diagnosis not present

## 2019-06-04 NOTE — Progress Notes (Signed)
Location:    [place] Room Number: 106/D Place of Service:  SNF (31)   CODE STATUS: DNR  Allergies  Allergen Reactions  . Other     Band Aids   . Tape   . Lipitor [Atorvastatin Calcium] Rash and Other (See Comments)    Muscle pain  . Niaspan [Niacin Er] Rash and Other (See Comments)    Muscle pain    Chief Complaint  Patient presents with  . Annual Exam    Annual Exam    HPI:  She is a 86 year old long term resident of this facility being seen for her annual exam. Her weight is table; no reports of falls; no reports of uncontrolled pain; no repots of anxiety or insomnia. She has not been hospitalized in the past year. She continues to be followed for her chronic illnesses including: chf; afib; ckd stage 3.   Past Medical History:  Diagnosis Date  . Anemia   . Atrial fibrillation ([place])   . Back pain   . Bacterial pneumonia   . Cancer (HCC)    Skin   . Chronic respiratory failure ([place])   . Cognitive communication deficit   . COPD (chronic obstructive pulmonary disease) ([place])   . Dementia ([place])   . Dementia ([place])   . Depression   . Diabetes mellitus without complication ([place])   . Diverticulosis   . Diverticulosis   . Fatty liver   . Fibromyalgia   . Fibromyalgia   . Gastric polyp 09/08/11   at the anastomosis-inflammatory  . Generalized anxiety disorder   . Generalized anxiety disorder   . GERD (gastroesophageal reflux disease)   . GERD (gastroesophageal reflux disease)   . 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 Hypercholesteremia   . Hypertension   . Insomnia   . Internal hemorrhoids   . Muscle weakness   . Neuropathy   . On home O2    qhs  . Osteoporosis   . Rheumatoid arteritis ([place])   . Ulcer    stomach  . Vitamin D deficiency     Past Surgical History:  Procedure Laterality Date  . ABDOMINAL HYSTERECTOMY    . ABDOMINAL SURGERY     removed patial stomach from ulcer  . COLONOSCOPY  04-2001   internal hemorrhoids,  panocolonic diverticulosis, and colon polyps   . UPPER GASTROINTESTINAL ENDOSCOPY      Social History   Socioeconomic History  . Marital status: Widowed    Spouse name: Not on file  . Number of children: 6  . Years of education: Not on file  . Highest education level: Not on file  Occupational History  . Occupation: Retired  Tobacco Use  . Smoking status: Never Smoker  . Smokeless tobacco: Never Used  Substance and Sexual Activity  . Alcohol use: No  . Drug use: No  . Sexual activity: Not Currently  Other Topics Concern  . Not on file  Social History Narrative   [facility] of [facility]    Social Determinants of Health   Financial Resource Strain:   . Difficulty of Paying Living Expenses:   Food Insecurity:   . Worried About Charity fundraiser in the Last Year:   . Arboriculturist in the Last Year:   Transportation Needs:   . Film/video editor (Medical):   Marland Kitchen Lack of Transportation (Non-Medical):   Physical Activity:   . Days of Exercise per Week:   . Minutes of  Exercise per Session:   Stress:   . Feeling of Stress :   Social Connections: Unknown  . Frequency of Communication with Friends and Family: Never  . Frequency of Social Gatherings with Friends and Family: Never  . Attends Religious Services: Not on file  . Active Member of Clubs or Organizations: Not on file  . Attends Archivist Meetings: Not on file  . Marital Status: Not on file  Intimate Partner Violence:   . Fear of Current or Ex-Partner:   . Emotionally Abused:   Marland Kitchen Physically Abused:   . Sexually Abused:    Family History  Problem Relation Age of Onset  . Heart disease Father   . Kidney disease Mother        kidney cancer  . Colon cancer Daughter 73      VITAL SIGNS BP 130/80   Pulse 77   Temp (!) 97.2 F (36.2 C) (Oral)   Resp (!) 24   Ht 5\' 3"  (1.6 m)   Wt 205 lb 3.2 oz (93.1 kg)   SpO2 96%   BMI 36.35 kg/m   Outpatient Encounter Medications as of 06/04/2019    Medication Sig  . acetaminophen (TYLENOL) 325 MG tablet Take 650 mg by mouth every 8 (eight) hours as needed. do not exceed 3g/24 hours of apap considering all sources  . acetaminophen (TYLENOL) 650 MG CR tablet Take 650 mg by mouth every 6 (six) hours.  Marland Kitchen apixaban (ELIQUIS) 2.5 MG TABS tablet Take 2.5 mg by mouth 2 (two) times daily.  Marland Kitchen CARTIA XT 300 MG 24 hr capsule TAKE ONE CAPSULE BY MOUTH EVERY DAY  . denosumab (PROLIA) 60 MG/ML SOSY injection Inject 60 mg into the skin every 6 (six) months.  . donepezil (ARICEPT) 5 MG tablet Take 5 mg by mouth at bedtime.  . DULoxetine (CYMBALTA) 30 MG capsule Take 30 mg by mouth daily.  . Fluticasone-Umeclidin-Vilant (TRELEGY ELLIPTA) 100-62.5-25 MCG/INH AEPB One puff inhalation once a day   . Folic Acid 5 MG CAPS Take 5 mg by mouth every Sunday.  . furosemide (LASIX) 40 MG tablet Take 40 mg by mouth daily.  Marland Kitchen guaiFENesin (MUCINEX) 600 MG 12 hr tablet Take 600 mg by mouth 2 (two) times daily.  . Menthol, Topical Analgesic, (BIOFREEZE) 4 % GEL Apply to both knees  daily as needed  . Menthol, Topical Analgesic, (BIOFREEZE) 4 % GEL Apply to left and right shoulder Once A Day  . methotrexate (RHEUMATREX) 2.5 MG tablet Take 12.5 mg by mouth once a week. On Sunday - Caution:Chemotherapy. Protect from light.  . NON FORMULARY Diet Type:  NAS, Regular  Consistent Carbohydrates, Dysphagia 3  . OXYGEN Inhale 3.5 L/min into the lungs continuous.  . pantoprazole (PROTONIX) 40 MG tablet Take 40 mg by mouth 2 (two) times daily.  . phenylephrine-shark liver oil-mineral oil-petrolatum (PREPARATION H) 0.25-3-14-71.9 % rectal ointment Place 1 application rectally 2 (two) times daily as needed for hemorrhoids.  . polyethylene glycol (MIRALAX / GLYCOLAX) packet Take 17 g by mouth daily as needed for moderate constipation.  . potassium chloride SA (K-DUR,KLOR-CON) 20 MEQ tablet Take 2 tablets (40 mg ) by mouth twice a day  . pregabalin (LYRICA) 50 MG capsule Take 1 capsule  by mouth  Twice daily  . rosuvastatin (CRESTOR) 20 MG tablet Take 20 mg by mouth daily.  . traMADol (ULTRAM) 50 MG tablet Take 1 tablet (50 mg total) by mouth 2 (two) times daily.  . [DISCONTINUED] guaifenesin (ROBITUSSIN)  100 MG/5ML syrup Take 200 mg by mouth 4 (four) times daily as needed for cough.   No facility-administered encounter medications on file as of 06/04/2019.     SIGNIFICANT DIAGNOSTIC EXAMS   PREVIOUS;   01-07-19: chest x-ray; COPD. Aortic atherosclerosis no acute pathology    01-20-19: DEXA: t score -4.997  NO NEW EXAMS.    LABS REVIEWED PREVIOUS :   06-01-18: hgb a1c 5.9; urine micro-albumin <3.0 09-26-18: wbc 8.3; hgb 16.2; hct 52.3 mcv 93.7; plt 156 11-02-18: glucose 117; bun 16; creat 1.02; k+ 4.2; na++ 142; ca 9.6 ;liver normal albumin 3.5; hgb a1c 5.8; urine micro-albumin 4.3   03-20-19: wbc 4.8; hgb 13.9; hct 44.9; mcv 95.7 plt 97; glucose 168; bun 21; creat 1.00 ;k+ 3.9; na++ 138; ca 9.5 liver normal albumin 3.2  03-27-19: wbc 6.1; hgb 15.1; hct 48.1; mcv 94.9 plt 121   TODAY  05-17-19: wbc 5.9; hgb 14.1; hct 45.8; mcv 92. 5 plt 112; glucose 134; bun 18; creat 0.94; k+ 3.9; na++ 140; ca 9.9 liver normal albumin 3.3; chol 130 ldl 68; trig 102; hdl 42; hgb a1c 6.4   Review of Systems  Constitutional: Negative for malaise/fatigue.  Respiratory: Negative for cough and shortness of breath.   Cardiovascular: Negative for chest pain, palpitations and leg swelling.  Gastrointestinal: Negative for abdominal pain, constipation and heartburn.  Musculoskeletal: Negative for back pain, joint pain and myalgias.  Skin: Negative.   Neurological: Negative for dizziness.  Psychiatric/Behavioral: The patient is not nervous/anxious.     Physical Exam Constitutional:      General: She is not in acute distress.    Appearance: She is well-developed. She is not diaphoretic.  HENT:     Nose: Nose normal.     Mouth/Throat:     Mouth: Mucous membranes are moist.     Pharynx:  Oropharynx is clear.  Eyes:     Conjunctiva/sclera: Conjunctivae normal.  Neck:     Thyroid: No thyromegaly.  Cardiovascular:     Rate and Rhythm: Normal rate and regular rhythm.     Pulses: Normal pulses.     Heart sounds: Normal heart sounds.  Pulmonary:     Effort: Pulmonary effort is normal. No respiratory distress.     Breath sounds: Normal breath sounds.  Abdominal:     General: Bowel sounds are normal. There is no distension.     Palpations: Abdomen is soft.     Tenderness: There is no abdominal tenderness.  Musculoskeletal:     Cervical back: Neck supple.     Right lower leg: No edema.     Left lower leg: No edema.     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  Skin:    General: Skin is warm and dry.  Neurological:     Mental Status: She is alert. Mental status is at baseline.  Psychiatric:        Mood and Affect: Mood normal.       ASSESSMENT/ PLAN:   TODAY:   1. Hypokalemia is stable k+ 3.9 will continue k+ 40 meq twice daily   2. Unspecified COPD/chronic respiratory failure with hypoxia: is stable is 02 dependent will continue trelegy ellipta 100-62.5-25 mcg 1 puff daily albuterol 2 puffs every 6 hours as needed  3. GERD without esophagitis: is stable will continue protonix 40 mg twice daily  4. vascular dementia without behavioral disturbance: is without change weight is 205 pounds will continue aricept 5 mg daily   5. CKD stage 3 due to type 2 diabetes mellitus: is stable bun 18; creat 0.94 will monitor  6. Diabetic peripheral neuropathy associated with type 2 diabetes mellitus is stable will continue lyrica 50 mg twice daily   7. Fibromyalgia: is stable will continue lyrica 50 mg twice daily   8. Rheumatoid arteritis: is stable will continue methotrexate 12.5 mg weekly and folic acid 5 mg weekly   9. Post-menopausal  osteoporosis: is stable t score -4.997 (01-20-19)  Will continue proli 60 mg every 6 months  10. Chronic constipation: is table will continue miralax daily sa needed  11. Type 2 diabetes mellitus with peripheral neuropatyh: is stable hgb a1c 6.4 is off tradjenta will monitor   12. Dyslipidemia associated with type 2 diabetes mellitus is stable LDL 68 will continue crestor 20 mg daily   13. Atria fibrillation with RVR: heart rate is stable will continue cartia xt 300 mg daily for rate control and eliquis 2.5 mg twice daily   14. Chronic diastolic heart failure EF 55-60% (07-28-16) will continue lasix 40 mg daily with k+ 40 meq twice daily    Health maintenance is up to date.      MD is aware of resident's narcotic use and is in agreement with current plan of care. We will attempt to wean resident as appropriate.  Ok Edwards NP [facility]  Contact [phone] Monday through Friday 8am- 5pm  After hours call [phone]

## 2019-06-10 ENCOUNTER — Other Ambulatory Visit: Payer: Self-pay | Admitting: Adult Health

## 2019-06-10 MED ORDER — TRAMADOL HCL 50 MG PO TABS: 50.0000 mg | ORAL_TABLET | Freq: Two times a day (BID) | ORAL | 0 refills | Status: DC

## 2019-06-13 DIAGNOSIS — Z23 Encounter for immunization: Secondary | ICD-10-CM | POA: Diagnosis not present

## 2019-06-18 DIAGNOSIS — I129 Hypertensive chronic kidney disease with stage 1 through stage 4 chronic kidney disease, or unspecified chronic kidney disease: Secondary | ICD-10-CM | POA: Diagnosis not present

## 2019-06-18 DIAGNOSIS — Z1159 Encounter for screening for other viral diseases: Secondary | ICD-10-CM | POA: Diagnosis not present

## 2019-07-01 ENCOUNTER — Other Ambulatory Visit: Payer: Self-pay | Admitting: Adult Health

## 2019-07-01 MED ORDER — PREGABALIN 50 MG PO CAPS: ORAL_CAPSULE | ORAL | 0 refills | Status: DC

## 2019-07-05 ENCOUNTER — Other Ambulatory Visit: Payer: Self-pay | Admitting: Adult Health

## 2019-07-05 MED ORDER — TRAMADOL HCL 50 MG PO TABS: 50.0000 mg | ORAL_TABLET | Freq: Two times a day (BID) | ORAL | 0 refills | Status: DC

## 2019-07-10 ENCOUNTER — Non-Acute Institutional Stay (SKILLED_NURSING_FACILITY): Payer: Medicare Other | Admitting: Internal Medicine

## 2019-07-10 ENCOUNTER — Encounter: Payer: Self-pay | Admitting: Internal Medicine

## 2019-07-10 DIAGNOSIS — I129 Hypertensive chronic kidney disease with stage 1 through stage 4 chronic kidney disease, or unspecified chronic kidney disease: Secondary | ICD-10-CM | POA: Diagnosis not present

## 2019-07-10 DIAGNOSIS — I5032 Chronic diastolic (congestive) heart failure: Secondary | ICD-10-CM | POA: Diagnosis not present

## 2019-07-10 DIAGNOSIS — Z1159 Encounter for screening for other viral diseases: Secondary | ICD-10-CM | POA: Diagnosis not present

## 2019-07-10 DIAGNOSIS — Z23 Encounter for immunization: Secondary | ICD-10-CM | POA: Diagnosis not present

## 2019-07-10 DIAGNOSIS — I10 Essential (primary) hypertension: Secondary | ICD-10-CM | POA: Diagnosis not present

## 2019-07-10 DIAGNOSIS — M0520 Rheumatoid vasculitis with rheumatoid arthritis of unspecified site: Secondary | ICD-10-CM | POA: Diagnosis not present

## 2019-07-10 NOTE — Progress Notes (Signed)
Location:  [place] Room Number: 106/D Place of Service:  SNF (31)  [name], MD  Patient Care Team: [name], MD as PCP - General (Internal Medicine) Nyoka Cowden Phylis Bougie, NP as Nurse Practitioner ([place]) [facility] ([place])  Extended Emergency Contact Information Primary Emergency Contact: Nance,Sheila Address: [address]          [place], [place] [address] [place] Phone: [phone] Relation: Daughter Secondary Emergency Contact: Charlynne Pander,  [address] [place] Phone: [phone] Relation: Granddaughter    Allergies: Other, Tape, Lipitor [atorvastatin calcium], and Niaspan [niacin er]  Chief Complaint  Patient presents with  . Medical Management of Chronic Issues    Routine visit of medical management    HPI: Patient is 86 y.o. female who is being seen for routine issues of chronic diastolic congestive heart failure, hypertension, and rheumatoid arthritis.  Past Medical History:  Diagnosis Date  . Anemia   . Atrial fibrillation ([place])   . Back pain   . Bacterial pneumonia   . Cancer (HCC)    Skin   . Chronic respiratory failure ([place])   . Cognitive communication deficit   . COPD (chronic obstructive pulmonary disease) ([place])   . Dementia ([place])   . Dementia ([place])   . Depression   . Diabetes mellitus without complication ([place])   . Diverticulosis   . Diverticulosis   . Fatty liver   . Fibromyalgia   . Fibromyalgia   . Gastric polyp 09/08/11   at the anastomosis-inflammatory  . Generalized anxiety disorder   . Generalized anxiety disorder   . GERD (gastroesophageal reflux disease)   . GERD (gastroesophageal reflux disease)   . 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 Hypercholesteremia   . Hypertension   . Insomnia   . Internal hemorrhoids   . Muscle weakness   . Neuropathy   . On home O2    qhs  .  Osteoporosis   . Rheumatoid arteritis ([place])   . Ulcer    stomach  . Vitamin D deficiency     Past Surgical History:  Procedure Laterality Date  . ABDOMINAL HYSTERECTOMY    . ABDOMINAL SURGERY     removed patial stomach from ulcer  . COLONOSCOPY  04-2001   internal hemorrhoids, panocolonic diverticulosis, and colon polyps   . UPPER GASTROINTESTINAL ENDOSCOPY      Outpatient Encounter Medications as of 07/10/2019  Medication Sig  . acetaminophen (TYLENOL) 325 MG tablet Take 650 mg by mouth every 8 (eight) hours as needed. do not exceed 3g/24 hours of apap considering all sources  . acetaminophen (TYLENOL) 650 MG CR tablet Take 650 mg by mouth every 6 (six) hours.  Marland Kitchen apixaban (ELIQUIS) 2.5 MG TABS tablet Take 2.5 mg by mouth 2 (two) times daily.  Marland Kitchen CARTIA XT 300 MG 24 hr capsule TAKE ONE CAPSULE BY MOUTH EVERY DAY  . denosumab (PROLIA) 60 MG/ML SOSY injection Inject 60 mg into the skin every 6 (six) months.  . donepezil (ARICEPT) 5 MG tablet Take 5 mg by mouth at bedtime.  . DULoxetine (CYMBALTA) 30 MG capsule Take 30 mg by mouth daily.  . Fluticasone-Umeclidin-Vilant (TRELEGY ELLIPTA) 100-62.5-25 MCG/INH AEPB One puff inhalation once a day   . Folic Acid 5 MG CAPS Take 5 mg by mouth every Sunday.  . furosemide (LASIX) 40 MG tablet Take 40 mg by mouth  daily.  . guaiFENesin (MUCINEX) 600 MG 12 hr tablet Take 600 mg by mouth 2 (two) times daily.  . Menthol, Topical Analgesic, (BIOFREEZE) 4 % GEL Apply to both knees  daily as needed  . Menthol, Topical Analgesic, (BIOFREEZE) 4 % GEL Apply to left and right shoulder Once A Day  . methotrexate (RHEUMATREX) 2.5 MG tablet Take 12.5 mg by mouth once a week. On Sunday - Caution:Chemotherapy. Protect from light.  . NON FORMULARY Diet Type:  NAS, Regular  Consistent Carbohydrates, Dysphagia 3  . OXYGEN Inhale 3.5 L/min into the lungs continuous.  . pantoprazole (PROTONIX) 40 MG tablet Take 40 mg by mouth 2 (two) times daily.  .  phenylephrine-shark liver oil-mineral oil-petrolatum (PREPARATION H) 0.25-3-14-71.9 % rectal ointment Place 1 application rectally 2 (two) times daily as needed for hemorrhoids.  . polyethylene glycol (MIRALAX / GLYCOLAX) packet Take 17 g by mouth daily as needed for moderate constipation.  . potassium chloride SA (K-DUR,KLOR-CON) 20 MEQ tablet Take 2 tablets (40 mg ) by mouth twice a day  . pregabalin (LYRICA) 50 MG capsule Take 1 capsule by mouth  Twice daily  . rosuvastatin (CRESTOR) 20 MG tablet Take 20 mg by mouth daily.  . traMADol (ULTRAM) 50 MG tablet Take 1 tablet (50 mg total) by mouth 2 (two) times daily.   No facility-administered encounter medications on file as of 07/10/2019.    No orders of the defined types were placed in this encounter.   Immunization History  Administered Date(s) Administered  . Influenza-Unspecified 12/17/2018  . Moderna SARS-COVID-2 Vaccination 06/13/2019  . Pneumococcal Conjugate-13 12/17/2018  . Tdap 10/21/2016    Social History   Tobacco Use  . Smoking status: Never Smoker  . Smokeless tobacco: Never Used  Substance Use Topics  . Alcohol use: No    Review of Systems  GENERAL:  no fevers, fatigue, appetite changes SKIN: No itching, rash HEENT: No complaint RESPIRATORY: No cough, wheezing, SOB CARDIAC: No chest pain, palpitations, lower extremity edema  GI: No abdominal pain, No N/V/D or constipation, No heartburn or reflux  GU: No dysuria, frequency or urgency, or incontinence  MUSCULOSKELETAL: No unrelieved bone/joint pain NEUROLOGIC: No headache, dizziness  PSYCHIATRIC: No overt anxiety or sadness  Vitals:   07/10/19 1142  BP: (!) 141/67  Pulse: 64  Resp: 20  Temp: 98 F (36.7 C)  SpO2: 94%   Body mass index is 37.34 kg/m. Physical Exam  GENERAL APPEARANCE: Alert, conversant, No acute distress  SKIN: No diaphoresis rash HEENT: Unremarkable RESPIRATORY: Breathing is even, unlabored. Lung sounds are slight wheeze with  good airflow CARDIOVASCULAR: Heart RRR no murmurs, rubs or gallops.  1+ peripheral edema  GASTROINTESTINAL: Abdomen is soft, non-tender, not distended w/ normal bowel sounds.  GENITOURINARY: Bladder non tender, not distended  MUSCULOSKELETAL: No abnormal joints or musculature NEUROLOGIC: Cranial nerves 2-12 grossly intact. Moves all extremities PSYCHIATRIC: Mood and affect appropriate to situation, no behavioral issues  Patient Active Problem List   Diagnosis Date Noted  . Medicare annual wellness visit, subsequent 02/12/2019  . Major depression, recurrent, chronic ([place]) 11/19/2018  . Dyslipidemia associated with type 2 diabetes mellitus ([place]) 05/03/2018  . Diabetic peripheral neuropathy associated with type 2 diabetes mellitus ([place]) 05/03/2018  . Vascular dementia without behavioral disturbance ([place]) 05/03/2018  . Post-menopausal osteoporosis 05/03/2018  . Chronic constipation 05/03/2018  . Left shoulder pain 07/25/2017  . Chronic diastolic heart failure ([place]) 07/03/2017  . Type 2 diabetes, controlled, with peripheral neuropathy ([place]) 04/20/2017  . Closed  compression fracture of L1 lumbar vertebra 04/10/2017  . CKD stage 3 due to type 2 diabetes mellitus ([place]) 04/01/2017  . Depression 03/31/2017  . Peripheral neuropathy 08/01/2016  . Hypertension 07/26/2016  . Prolonged QT interval 07/26/2016  . Chronic respiratory failure with hypoxia ([place]) 07/21/2013  . Hypokalemia 07/21/2013  . Atrial fibrillation with RVR ([place]) 07/21/2013  . Fibromyalgia   . COPD (chronic obstructive pulmonary disease) ([place])   . GERD (gastroesophageal reflux disease)   . Rheumatoid arteritis ([place])   . Back pain     CMP     Component Value Date/Time   NA 140 05/17/2019 0340   K 3.9 05/17/2019 0340   CL 105 05/17/2019 0340   CO2 28 05/17/2019 0340   GLUCOSE 134 (H) 05/17/2019 0340   BUN 18 05/17/2019 0340   CREATININE 0.94 05/17/2019 0340   CREATININE 1.14 (H) 09/11/2013 1016   CALCIUM 9.9 05/17/2019  0340   PROT 6.4 (L) 05/17/2019 0340   ALBUMIN 3.3 (L) 05/17/2019 0340   AST 36 05/17/2019 0340   ALT 30 05/17/2019 0340   ALKPHOS 52 05/17/2019 0340   BILITOT 0.6 05/17/2019 0340   GFRNONAA 54 (L) 05/17/2019 0340   GFRAA >60 05/17/2019 0340   Recent Labs    11/02/18 0823 03/20/19 0430 05/17/19 0340  NA 142 138 140  K 4.2 3.9 3.9  CL 106 101 105  CO2 27 27 28   GLUCOSE 117* 168* 134*  BUN 16 21 18   CREATININE 1.02* 1.00 0.94  CALCIUM 9.6 9.5 9.9   Recent Labs    11/02/18 0823 03/20/19 0430 05/17/19 0340  AST 25 45* 36  ALT 22 34 30  ALKPHOS 75 51 52  BILITOT 0.6 0.9 0.6  PROT 6.3* 6.1* 6.4*  ALBUMIN 3.5 3.2* 3.3*   Recent Labs    09/26/18 0900 09/26/18 0900 03/20/19 0430 03/27/19 0630 05/17/19 0340  WBC 8.3   < > 4.8 6.1 5.9  NEUTROABS 4.3  --   --   --   --   HGB 16.2*   < > 13.9 15.1* 14.1  HCT 52.3*   < > 44.9 48.1* 45.8  MCV 93.7   < > 95.7 94.9 92.5  PLT 156   < > 97* 121* 112*   < > = values in this interval not displayed.   Recent Labs    05/17/19 0340  CHOL 130  LDLCALC 68  TRIG 102   Lab Results  Component Value Date   MICROALBUR 4.3 (H) 11/02/2018   Lab Results  Component Value Date   TSH 2.992 07/26/2016   Lab Results  Component Value Date   HGBA1C 6.4 (H) 05/17/2019   Lab Results  Component Value Date   CHOL 130 05/17/2019   HDL 42 05/17/2019   LDLCALC 68 05/17/2019   TRIG 102 05/17/2019   CHOLHDL 3.1 05/17/2019    Significant Diagnostic Results in last 30 days:  No results found.  Assessment and Plan  Chronic diastolic heart failure (HCC) No reported exacerbation; continue Lasix 40 mg daily  Hypertension Chronic and stable; continue Cartia 300 mg daily and Lasix 40 mg daily  Rheumatoid arteritis Stable; continue methotrexate 12.5 mg weekly     [name], MD

## 2019-07-13 ENCOUNTER — Encounter: Payer: Self-pay | Admitting: Internal Medicine

## 2019-07-13 NOTE — Assessment & Plan Note (Signed)
No reported exacerbation; continue Lasix 40 mg daily

## 2019-07-13 NOTE — Assessment & Plan Note (Signed)
Chronic and stable; continue Cartia 300 mg daily and Lasix 40 mg daily

## 2019-07-13 NOTE — Assessment & Plan Note (Signed)
Stable; continue methotrexate 12.5 mg weekly

## 2019-07-24 ENCOUNTER — Other Ambulatory Visit: Payer: Self-pay | Admitting: Adult Health

## 2019-07-24 MED ORDER — TRAMADOL HCL 50 MG PO TABS: 50.0000 mg | ORAL_TABLET | Freq: Two times a day (BID) | ORAL | 0 refills | Status: DC

## 2019-07-24 MED ORDER — PREGABALIN 50 MG PO CAPS: ORAL_CAPSULE | ORAL | 0 refills | Status: DC

## 2019-07-31 ENCOUNTER — Other Ambulatory Visit: Payer: Self-pay | Admitting: Adult Health

## 2019-07-31 MED ORDER — PREGABALIN 50 MG PO CAPS: ORAL_CAPSULE | ORAL | 0 refills | Status: DC

## 2019-08-07 ENCOUNTER — Encounter: Payer: Self-pay | Admitting: Adult Health

## 2019-08-07 ENCOUNTER — Non-Acute Institutional Stay (SKILLED_NURSING_FACILITY): Payer: Medicare Other | Admitting: Adult Health

## 2019-08-07 DIAGNOSIS — J449 Chronic obstructive pulmonary disease, unspecified: Secondary | ICD-10-CM

## 2019-08-07 DIAGNOSIS — K219 Gastro-esophageal reflux disease without esophagitis: Secondary | ICD-10-CM | POA: Diagnosis not present

## 2019-08-07 DIAGNOSIS — J9611 Chronic respiratory failure with hypoxia: Secondary | ICD-10-CM

## 2019-08-07 DIAGNOSIS — F0150 Vascular dementia without behavioral disturbance: Secondary | ICD-10-CM

## 2019-08-07 NOTE — Progress Notes (Signed)
Location:    [place] [facility] Room Number: 106/D Place of Service:  SNF (31)   CODE STATUS: DNR  Allergies  Allergen Reactions  . Other     Band Aids   . Tape   . Lipitor [Atorvastatin Calcium] Rash and Other (See Comments)    Muscle pain  . Niaspan [Niacin Er] Rash and Other (See Comments)    Muscle pain    Chief Complaint  Patient presents with  . Medical Management of Chronic Issues          Unspecified COPD/ chronic respiratory failure with hypoxia:   GERD without esophagitis:  Vascular dementia without behavioral disturbance:    HPI:  She is 86 year old long term resident of this facility being seen for the management of her chronic illnesses; copd; gerd; dementia. There are no reports of uncontrolled pain; no changes in her appetite; no reports of agitation or anxiety.   Past Medical History:  Diagnosis Date  . Anemia   . Atrial fibrillation ([place])   . Back pain   . Bacterial pneumonia   . Cancer (HCC)    Skin   . Chronic respiratory failure ([place])   . Cognitive communication deficit   . COPD (chronic obstructive pulmonary disease) ([place])   . Dementia ([place])   . Dementia ([place])   . Depression   . Diabetes mellitus without complication ([place])   . Diverticulosis   . Diverticulosis   . Fatty liver   . Fibromyalgia   . Fibromyalgia   . Gastric polyp 09/08/11   at the anastomosis-inflammatory  . Generalized anxiety disorder   . Generalized anxiety disorder   . GERD (gastroesophageal reflux disease)   . GERD (gastroesophageal reflux disease)   . 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 Hypercholesteremia   . Hypertension   . Insomnia   . Internal hemorrhoids   . Muscle weakness   . Neuropathy   . On home O2    qhs  . Osteoporosis   . Rheumatoid arteritis ([place])   . Ulcer    stomach  . Vitamin D deficiency     Past Surgical History:  Procedure Laterality Date  . ABDOMINAL HYSTERECTOMY    . ABDOMINAL SURGERY     removed patial  stomach from ulcer  . COLONOSCOPY  04-2001   internal hemorrhoids, panocolonic diverticulosis, and colon polyps   . UPPER GASTROINTESTINAL ENDOSCOPY      Social History   Socioeconomic History  . Marital status: Widowed    Spouse name: Not on file  . Number of children: 6  . Years of education: Not on file  . Highest education level: Not on file  Occupational History  . Occupation: Retired  Tobacco Use  . Smoking status: Never Smoker  . Smokeless tobacco: Never Used  Substance and Sexual Activity  . Alcohol use: No  . Drug use: No  . Sexual activity: Not Currently  Other Topics Concern  . Not on file  Social History Narrative   Long term resident of [facility]    Social Determinants of Health   Financial Resource Strain:   . Difficulty of Paying Living Expenses:   Food Insecurity:   . Worried About Charity fundraiser in the Last Year:   . Arboriculturist in the Last Year:   Transportation Needs:   . Film/video editor (Medical):   Marland Kitchen Lack of Transportation (Non-Medical):   Physical Activity:   . Days of Exercise per Week:   .  Minutes of Exercise per Session:   Stress:   . Feeling of Stress :   Social Connections: Unknown  . Frequency of Communication with Friends and Family: Never  . Frequency of Social Gatherings with Friends and Family: Never  . Attends Religious Services: Not on file  . Active Member of Clubs or Organizations: Not on file  . Attends Archivist Meetings: Not on file  . Marital Status: Not on file  Intimate Partner Violence:   . Fear of Current or Ex-Partner:   . Emotionally Abused:   Marland Kitchen Physically Abused:   . Sexually Abused:    Family History  Problem Relation Age of Onset  . Heart disease Father   . Kidney disease Mother        kidney cancer  . Colon cancer Daughter 80      VITAL SIGNS BP 134/78   Pulse 71   Temp 97.8 F (36.6 C) (Oral)   Resp 20   Ht 5\' 3"  (1.6 m)   Wt 208 lb 9.6 oz (94.6 kg)   SpO2 96%   BMI 36.95  kg/m   Outpatient Encounter Medications as of 08/07/2019  Medication Sig  . acetaminophen (TYLENOL) 325 MG tablet Take 650 mg by mouth every 8 (eight) hours as needed. do not exceed 3g/24 hours of apap considering all sources  . acetaminophen (TYLENOL) 650 MG CR tablet Take 650 mg by mouth every 6 (six) hours.  Marland Kitchen apixaban (ELIQUIS) 2.5 MG TABS tablet Take 2.5 mg by mouth 2 (two) times daily.  Marland Kitchen CARTIA XT 300 MG 24 hr capsule TAKE ONE CAPSULE BY MOUTH EVERY DAY  . denosumab (PROLIA) 60 MG/ML SOSY injection Inject 60 mg into the skin every 6 (six) months.  . donepezil (ARICEPT) 5 MG tablet Take 5 mg by mouth at bedtime.  . DULoxetine (CYMBALTA) 30 MG capsule Take 30 mg by mouth daily.  . Fluticasone-Umeclidin-Vilant (TRELEGY ELLIPTA) 100-62.5-25 MCG/INH AEPB One puff inhalation once a day   . Folic Acid 5 MG CAPS Take 5 mg by mouth every Sunday.  . furosemide (LASIX) 40 MG tablet Take 40 mg by mouth daily.  Marland Kitchen guaiFENesin (MUCINEX) 600 MG 12 hr tablet Take 600 mg by mouth 2 (two) times daily.  . Menthol, Topical Analgesic, (BIOFREEZE) 4 % GEL Apply to both knees  daily as needed  . Menthol, Topical Analgesic, (BIOFREEZE) 4 % GEL Apply to bilateral  shoulders Once A Day PRN  . methotrexate (RHEUMATREX) 2.5 MG tablet Take 12.5 mg by mouth once a week. On Sunday - Caution:Chemotherapy. Protect from light.  . NON FORMULARY Diet Type:  NAS, Regular  Consistent Carbohydrates, Dysphagia 3  . OXYGEN Inhale 3.5 L/min into the lungs continuous.  . pantoprazole (PROTONIX) 40 MG tablet Take 40 mg by mouth 2 (two) times daily.  . phenylephrine-shark liver oil-mineral oil-petrolatum (PREPARATION H) 0.25-3-14-71.9 % rectal ointment Place 1 application rectally 2 (two) times daily as needed for hemorrhoids.  . polyethylene glycol (MIRALAX / GLYCOLAX) packet Take 17 g by mouth daily as needed for moderate constipation.  . potassium chloride SA (K-DUR,KLOR-CON) 20 MEQ tablet Take 2 tablets (40 mg ) by mouth twice  a day  . pregabalin (LYRICA) 50 MG capsule Take 1 capsule by mouth  Twice daily  . rosuvastatin (CRESTOR) 20 MG tablet Take 20 mg by mouth daily.  . traMADol (ULTRAM) 50 MG tablet Take 1 tablet (50 mg total) by mouth 2 (two) times daily.   No facility-administered encounter medications  on file as of 08/07/2019.     SIGNIFICANT DIAGNOSTIC EXAMS  PREVIOUS;   01-07-19: chest x-ray; COPD. Aortic atherosclerosis no acute pathology    01-20-19: DEXA: t score -4.997  NO NEW EXAMS.    LABS REVIEWED PREVIOUS :   09-26-18: wbc 8.3; hgb 16.2; hct 52.3 mcv 93.7; plt 156 11-02-18: glucose 117; bun 16; creat 1.02; k+ 4.2; na++ 142; ca 9.6 ;liver normal albumin 3.5; hgb a1c 5.8; urine micro-albumin 4.3   03-20-19: wbc 4.8; hgb 13.9; hct 44.9; mcv 95.7 plt 97; glucose 168; bun 21; creat 1.00 ;k+ 3.9; na++ 138; ca 9.5 liver normal albumin 3.2  03-27-19: wbc 6.1; hgb 15.1; hct 48.1; mcv 94.9 plt 121  05-17-19: wbc 5.9; hgb 14.1; hct 45.8; mcv 92. 5 plt 112; glucose 134; bun 18; creat 0.94; k+ 3.9; na++ 140; ca 9.9 liver normal albumin 3.3; chol 130 ldl 68; trig 102; hdl 42; hgb a1c 6.4   NO NEW LABS.   Review of Systems  Constitutional: Negative for malaise/fatigue.  Respiratory: Negative for cough and shortness of breath.   Cardiovascular: Negative for chest pain, palpitations and leg swelling.  Gastrointestinal: Negative for abdominal pain, constipation and heartburn.  Musculoskeletal: Negative for back pain, joint pain and myalgias.  Skin: Negative.   Neurological: Negative for dizziness.  Psychiatric/Behavioral: The patient is not nervous/anxious.      Physical Exam Constitutional:      General: She is not in acute distress.    Appearance: She is well-developed. She is not diaphoretic.  Neck:     Thyroid: No thyromegaly.  Cardiovascular:     Rate and Rhythm: Normal rate and regular rhythm.     Pulses: Normal pulses.     Heart sounds: Normal heart sounds.  Pulmonary:     Effort: Pulmonary  effort is normal. No respiratory distress.     Breath sounds: Normal breath sounds.  Abdominal:     General: Bowel sounds are normal. There is no distension.     Palpations: Abdomen is soft.     Tenderness: There is no abdominal tenderness.  Musculoskeletal:     Cervical back: Neck supple.     Right lower leg: No edema.     Left lower leg: No edema.     Comments: Unable to move left upper extremity due to frozen left shoulder Able to move other extremities     Lymphadenopathy:     Cervical: No cervical adenopathy.  Skin:    General: Skin is warm and dry.  Neurological:     Mental Status: She is alert. Mental status is at baseline.  Psychiatric:        Mood and Affect: Mood normal.      ASSESSMENT/ PLAN:  TODAY:   1. Unspecified COPD/ chronic respiratory failure with hypoxia: is stable is 02 dependent: will continue trelegy ellipta 100-62,5-25 mcg 1 puff daily albuterol 2 puffs every 6 hours as needed  2. GERD without esophagitis: is stable will continue protonix 40 mg twice daily   3. Vascular dementia without behavioral disturbance: is without change weight is 208 pounds will continue aricept 5 mg daily   PREVIOUS   4. CKD stage 3 due to type 2 diabetes mellitus: is stable bun 18; creat 0.94 will monitor  5. Diabetic peripheral neuropathy associated with type 2 diabetes mellitus is stable will continue lyrica 50 mg twice daily   6. Fibromyalgia: is stable will continue lyrica 50 mg twice daily   7. Rheumatoid arteritis: is stable  will continue methotrexate 12.5 mg weekly and folic acid 5 mg weekly   8. Post-menopausal osteoporosis: is stable t score -4.997 (01-20-19)  Will continue proli 60 mg every 6 months  9. Chronic constipation: is table will continue miralax daily sa needed  10. Type 2 diabetes mellitus with peripheral neuropatyh: is stable hgb a1c 6.4 is off tradjenta will monitor   11. Dyslipidemia associated with type 2 diabetes mellitus is stable LDL 68  will continue crestor 20 mg daily   12. Atria fibrillation with RVR: heart rate is stable will continue cartia xt 300 mg daily for rate control and eliquis 2.5 mg twice daily   13. Chronic diastolic heart failure EF 55-60% (07-28-16) will continue lasix 40 mg daily with k+ 40 meq twice daily    14. Hypokalemia is stable k+ 3.9 will continue k+ 40 meq twice daily      MD is aware of resident's narcotic use and is in agreement with current plan of care. We will attempt to wean resident as appropriate.  Ok Edwards NP [facility]  Contact [phone] Monday through Friday 8am- 5pm  After hours call [phone]

## 2019-08-08 ENCOUNTER — Encounter: Payer: Self-pay | Admitting: Adult Health

## 2019-08-08 ENCOUNTER — Non-Acute Institutional Stay (SKILLED_NURSING_FACILITY): Payer: Medicare Other | Admitting: Adult Health

## 2019-08-08 DIAGNOSIS — J9611 Chronic respiratory failure with hypoxia: Secondary | ICD-10-CM | POA: Diagnosis not present

## 2019-08-08 DIAGNOSIS — F0150 Vascular dementia without behavioral disturbance: Secondary | ICD-10-CM | POA: Diagnosis not present

## 2019-08-08 DIAGNOSIS — I5032 Chronic diastolic (congestive) heart failure: Secondary | ICD-10-CM

## 2019-08-08 NOTE — Progress Notes (Signed)
Location:    [facility] Room Number: 106/D Place of Service:  SNF (31)   CODE STATUS: DNR  Allergies  Allergen Reactions  . Other     Band Aids   . Tape   . Lipitor [Atorvastatin Calcium] Rash and Other (See Comments)    Muscle pain  . Niaspan [Niacin Er] Rash and Other (See Comments)    Muscle pain    Chief Complaint  Patient presents with  . Acute Visit    Care Plan Meeting    HPI:  We have come together for her care plan meeting. BIMS 10/15 mood 5/30. Her weight is stable. No reports of falls present. She requires supervision to limited assist with adls; she is incontinent of bladder and bowel. She continues to be followed for her chronic illnesses including: chronic respiratory failure; chf; dementia.   Past Medical History:  Diagnosis Date  . Anemia   . Atrial fibrillation ([place])   . Back pain   . Bacterial pneumonia   . Cancer (HCC)    Skin   . Chronic respiratory failure ([place])   . Cognitive communication deficit   . COPD (chronic obstructive pulmonary disease) ([place])   . Dementia ([place])   . Dementia ([place])   . Depression   . Diabetes mellitus without complication ([place])   . Diverticulosis   . Diverticulosis   . Fatty liver   . Fibromyalgia   . Fibromyalgia   . Gastric polyp 09/08/11   at the anastomosis-inflammatory  . Generalized anxiety disorder   . Generalized anxiety disorder   . GERD (gastroesophageal reflux disease)   . GERD (gastroesophageal reflux disease)   . 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 Hypercholesteremia   . Hypertension   . Insomnia   . Internal hemorrhoids   . Muscle weakness   . Neuropathy   . On home O2    qhs  . Osteoporosis   . Rheumatoid arteritis ([place])   . Ulcer    stomach  . Vitamin D deficiency     Past Surgical History:  Procedure Laterality Date  . ABDOMINAL HYSTERECTOMY    . ABDOMINAL SURGERY     removed patial stomach from ulcer  . COLONOSCOPY  04-2001   internal hemorrhoids,  panocolonic diverticulosis, and colon polyps   . UPPER GASTROINTESTINAL ENDOSCOPY      Social History   Socioeconomic History  . Marital status: Widowed    Spouse name: Not on file  . Number of children: 6  . Years of education: Not on file  . Highest education level: Not on file  Occupational History  . Occupation: Retired  Tobacco Use  . Smoking status: Never Smoker  . Smokeless tobacco: Never Used  Substance and Sexual Activity  . Alcohol use: No  . Drug use: No  . Sexual activity: Not Currently  Other Topics Concern  . Not on file  Social History Narrative   Long term resident of [facility]    Social Determinants of Health   Financial Resource Strain:   . Difficulty of Paying Living Expenses:   Food Insecurity:   . Worried About Charity fundraiser in the Last Year:   . Arboriculturist in the Last Year:   Transportation Needs:   . Film/video editor (Medical):   Marland Kitchen Lack of Transportation (Non-Medical):   Physical Activity:   . Days of Exercise per Week:   . Minutes of Exercise per Session:   Stress:   .  Feeling of Stress :   Social Connections: Unknown  . Frequency of Communication with Friends and Family: Never  . Frequency of Social Gatherings with Friends and Family: Never  . Attends Religious Services: Not on file  . Active Member of Clubs or Organizations: Not on file  . Attends Archivist Meetings: Not on file  . Marital Status: Not on file  Intimate Partner Violence:   . Fear of Current or Ex-Partner:   . Emotionally Abused:   Marland Kitchen Physically Abused:   . Sexually Abused:    Family History  Problem Relation Age of Onset  . Heart disease Father   . Kidney disease Mother        kidney cancer  . Colon cancer Daughter 66      VITAL SIGNS BP 134/78   Pulse 71   Temp 97.6 F (36.4 C) (Oral)   Resp 20   Ht 5\' 3"  (1.6 m)   Wt 208 lb 9.6 oz (94.6 kg)   SpO2 96%   BMI 36.95 kg/m   Outpatient Encounter Medications as of 08/08/2019    Medication Sig  . acetaminophen (TYLENOL) 325 MG tablet Take 650 mg by mouth every 8 (eight) hours as needed. do not exceed 3g/24 hours of apap considering all sources  . acetaminophen (TYLENOL) 650 MG CR tablet Take 650 mg by mouth every 6 (six) hours.  Marland Kitchen apixaban (ELIQUIS) 2.5 MG TABS tablet Take 2.5 mg by mouth 2 (two) times daily.  Marland Kitchen CARTIA XT 300 MG 24 hr capsule TAKE ONE CAPSULE BY MOUTH EVERY DAY  . denosumab (PROLIA) 60 MG/ML SOSY injection Inject 60 mg into the skin every 6 (six) months.  . donepezil (ARICEPT) 5 MG tablet Take 5 mg by mouth at bedtime.  . DULoxetine (CYMBALTA) 30 MG capsule Take 30 mg by mouth daily.  . Fluticasone-Umeclidin-Vilant (TRELEGY ELLIPTA) 100-62.5-25 MCG/INH AEPB One puff inhalation once a day   . Folic Acid 5 MG CAPS Take 5 mg by mouth every Sunday.  . furosemide (LASIX) 40 MG tablet Take 40 mg by mouth daily.  Marland Kitchen guaiFENesin (MUCINEX) 600 MG 12 hr tablet Take 600 mg by mouth 2 (two) times daily.  . Menthol, Topical Analgesic, (BIOFREEZE) 4 % GEL Apply to both knees  daily as needed  . Menthol, Topical Analgesic, (BIOFREEZE) 4 % GEL Apply to bilateral  shoulders Once A Day PRN  . methotrexate (RHEUMATREX) 2.5 MG tablet Take 12.5 mg by mouth once a week. On Sunday - Caution:Chemotherapy. Protect from light.  . NON FORMULARY Diet Type:  NAS, Regular  Consistent Carbohydrates, Dysphagia 3  . OXYGEN Inhale 3.5 L/min into the lungs continuous.  . pantoprazole (PROTONIX) 40 MG tablet Take 40 mg by mouth 2 (two) times daily.  . phenylephrine-shark liver oil-mineral oil-petrolatum (PREPARATION H) 0.25-3-14-71.9 % rectal ointment Place 1 application rectally 2 (two) times daily as needed for hemorrhoids.  . polyethylene glycol (MIRALAX / GLYCOLAX) packet Take 17 g by mouth daily as needed for moderate constipation.  . potassium chloride SA (K-DUR,KLOR-CON) 20 MEQ tablet Take 2 tablets (40 mg ) by mouth twice a day  . pregabalin (LYRICA) 50 MG capsule Take 1 capsule  by mouth  Twice daily  . rosuvastatin (CRESTOR) 20 MG tablet Take 20 mg by mouth daily.  . traMADol (ULTRAM) 50 MG tablet Take 1 tablet (50 mg total) by mouth 2 (two) times daily.   No facility-administered encounter medications on file as of 08/08/2019.     SIGNIFICANT  DIAGNOSTIC EXAMS  PREVIOUS;   01-07-19: chest x-ray; COPD. Aortic atherosclerosis no acute pathology    01-20-19: DEXA: t score -4.997  NO NEW EXAMS.    LABS REVIEWED PREVIOUS :   09-26-18: wbc 8.3; hgb 16.2; hct 52.3 mcv 93.7; plt 156 11-02-18: glucose 117; bun 16; creat 1.02; k+ 4.2; na++ 142; ca 9.6 ;liver normal albumin 3.5; hgb a1c 5.8; urine micro-albumin 4.3   03-20-19: wbc 4.8; hgb 13.9; hct 44.9; mcv 95.7 plt 97; glucose 168; bun 21; creat 1.00 ;k+ 3.9; na++ 138; ca 9.5 liver normal albumin 3.2  03-27-19: wbc 6.1; hgb 15.1; hct 48.1; mcv 94.9 plt 121  05-17-19: wbc 5.9; hgb 14.1; hct 45.8; mcv 92. 5 plt 112; glucose 134; bun 18; creat 0.94; k+ 3.9; na++ 140; ca 9.9 liver normal albumin 3.3; chol 130 ldl 68; trig 102; hdl 42; hgb a1c 6.4   NO NEW LABS.   Review of Systems  Constitutional: Negative for malaise/fatigue.  Respiratory: Negative for cough and shortness of breath.   Cardiovascular: Negative for chest pain, palpitations and leg swelling.  Gastrointestinal: Negative for abdominal pain, constipation and heartburn.  Musculoskeletal: Negative for back pain, joint pain and myalgias.  Skin: Negative.   Neurological: Negative for dizziness.  Psychiatric/Behavioral: The patient is not nervous/anxious.     Physical Exam Constitutional:      General: She is not in acute distress.    Appearance: She is well-developed. She is not diaphoretic.  Neck:     Thyroid: No thyromegaly.  Cardiovascular:     Rate and Rhythm: Normal rate and regular rhythm.     Pulses: Normal pulses.     Heart sounds: Normal heart sounds.  Pulmonary:     Effort: Pulmonary effort is normal. No respiratory distress.     Breath  sounds: Normal breath sounds.  Abdominal:     General: Bowel sounds are normal. There is no distension.     Palpations: Abdomen is soft.     Tenderness: There is no abdominal tenderness.  Musculoskeletal:     Cervical back: Neck supple.     Right lower leg: No edema.     Left lower leg: No edema.     Comments: Unable to move left upper extremity due to frozen left shoulder Able to move other extremities      Lymphadenopathy:     Cervical: No cervical adenopathy.  Skin:    General: Skin is warm and dry.  Neurological:     Mental Status: She is alert. Mental status is at baseline.  Psychiatric:        Mood and Affect: Mood normal.        ASSESSMENT/ PLAN:  TODAY  1. Chronic respiratory failure with hypoxia 2. Chronic diastolic heart failure 3. Vascular dementia without behavioral disturbance  Will continue current medications Will continue current plan of care Will continue to monitor her status.     MD is aware of resident's narcotic use and is in agreement with current plan of care. We will attempt to wean resident as appropriate.  Ok Edwards NP [facility]  Contact [phone] Monday through Friday 8am- 5pm  After hours call [phone]

## 2019-08-15 ENCOUNTER — Encounter (HOSPITAL_COMMUNITY)
Admission: RE | Admit: 2019-08-15 | Discharge: 2019-08-15 | Disposition: A | Discharge status: Home / Self Care | Payer: Medicare Other | Source: Skilled Nursing Facility | Attending: Adult Health | Admitting: Adult Health

## 2019-08-15 DIAGNOSIS — E118 Type 2 diabetes mellitus with unspecified complications: Secondary | ICD-10-CM | POA: Insufficient documentation

## 2019-08-16 LAB — MICROALBUMIN, URINE: Microalb, Ur: 5 ug/mL — ABNORMAL HIGH

## 2019-08-20 ENCOUNTER — Other Ambulatory Visit: Payer: Self-pay | Admitting: Adult Health

## 2019-08-20 MED ORDER — PREGABALIN 50 MG PO CAPS: ORAL_CAPSULE | ORAL | 0 refills | Status: DC

## 2019-08-20 MED ORDER — TRAMADOL HCL 50 MG PO TABS: 50.0000 mg | ORAL_TABLET | Freq: Two times a day (BID) | ORAL | 0 refills | Status: DC

## 2019-08-28 ENCOUNTER — Encounter: Payer: Self-pay | Admitting: Adult Health

## 2019-08-28 ENCOUNTER — Non-Acute Institutional Stay: Payer: Self-pay | Admitting: Adult Health

## 2019-08-28 DIAGNOSIS — F339 Major depressive disorder, recurrent, unspecified: Secondary | ICD-10-CM

## 2019-08-28 DIAGNOSIS — M797 Fibromyalgia: Secondary | ICD-10-CM

## 2019-08-28 NOTE — Progress Notes (Addendum)
Location:    [place] Room Number: 106/D Place of Service:  SNF (31)   CODE STATUS: DNR  Allergies  Allergen Reactions  . Other     Band Aids   . Tape   . Lipitor [Atorvastatin Calcium] Rash and Other (See Comments)    Muscle pain  . Niaspan [Niacin Er] Rash and Other (See Comments)    Muscle pain    Chief Complaint  Patient presents with  . Acute Visit    Medication Review    HPI:  We have come together for her medication review.. She is presently taking cymbalta 30 mg daily utram 50 mg twice daily; lyrica 50 mg twice daily . There are no reports of uncontrolled pain; no changes in appetite; no reports of anxiety or depressive thoughts. She is due for a dose reduction. She is stable both with her pain management and emotionally.   Past Medical History:  Diagnosis Date  . Anemia   . Atrial fibrillation ([place])   . Back pain   . Bacterial pneumonia   . Cancer (HCC)    Skin   . Chronic respiratory failure ([place])   . Cognitive communication deficit   . COPD (chronic obstructive pulmonary disease) ([place])   . Dementia ([place])   . Dementia ([place])   . Depression   . Diabetes mellitus without complication ([place])   . Diverticulosis   . Diverticulosis   . Fatty liver   . Fibromyalgia   . Fibromyalgia   . Gastric polyp 09/08/11   at the anastomosis-inflammatory  . Generalized anxiety disorder   . Generalized anxiety disorder   . GERD (gastroesophageal reflux disease)   . GERD (gastroesophageal reflux disease)   . 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 Hypercholesteremia   . Hypertension   . Insomnia   . Internal hemorrhoids   . Muscle weakness   . Neuropathy   . On home O2    qhs  . Osteoporosis   . Rheumatoid arteritis ([place])   . Ulcer    stomach  . Vitamin D deficiency     Past Surgical History:  Procedure Laterality Date  . ABDOMINAL HYSTERECTOMY    . ABDOMINAL SURGERY     removed patial stomach from ulcer  . COLONOSCOPY  04-2001     internal hemorrhoids, panocolonic diverticulosis, and colon polyps   . UPPER GASTROINTESTINAL ENDOSCOPY      Social History   Socioeconomic History  . Marital status: Widowed    Spouse name: Not on file  . Number of children: 6  . Years of education: Not on file  . Highest education level: Not on file  Occupational History  . Occupation: Retired  Tobacco Use  . Smoking status: Never Smoker  . Smokeless tobacco: Never Used  Vaping Use  . Vaping Use: Never used  Substance and Sexual Activity  . Alcohol use: No  . Drug use: No  . Sexual activity: Not Currently  Other Topics Concern  . Not on file  Social History Narrative   Long term resident of [facility]    Social Determinants of Health   Financial Resource Strain:   . Difficulty of Paying Living Expenses:   Food Insecurity:   . Worried About Charity fundraiser in the Last Year:   . Arboriculturist in the Last Year:   Transportation Needs:   . Film/video editor (Medical):   Marland Kitchen Lack of Transportation (Non-Medical):   Physical Activity:   .  Days of Exercise per Week:   . Minutes of Exercise per Session:   Stress:   . Feeling of Stress :   Social Connections: Unknown  . Frequency of Communication with Friends and Family: Never  . Frequency of Social Gatherings with Friends and Family: Never  . Attends Religious Services: Not on file  . Active Member of Clubs or Organizations: Not on file  . Attends Archivist Meetings: Not on file  . Marital Status: Not on file  Intimate Partner Violence:   . Fear of Current or Ex-Partner:   . Emotionally Abused:   Marland Kitchen Physically Abused:   . Sexually Abused:    Family History  Problem Relation Age of Onset  . Heart disease Father   . Kidney disease Mother        kidney cancer  . Colon cancer Daughter 34      VITAL SIGNS BP 130/70   Pulse 72   Temp 97.6 F (36.4 C) (Oral)   Ht 5\' 3"  (1.6 m)   Wt 214 lb (97.1 kg)   SpO2 97%   BMI 37.91 kg/m   Outpatient  Encounter Medications as of 08/28/2019  Medication Sig  . acetaminophen (TYLENOL) 325 MG tablet Take 650 mg by mouth every 8 (eight) hours as needed. do not exceed 3g/24 hours of apap considering all sources  . acetaminophen (TYLENOL) 650 MG CR tablet Take 650 mg by mouth every 6 (six) hours.  Marland Kitchen apixaban (ELIQUIS) 2.5 MG TABS tablet Take 2.5 mg by mouth 2 (two) times daily.  Marland Kitchen CARTIA XT 300 MG 24 hr capsule TAKE ONE CAPSULE BY MOUTH EVERY DAY  . denosumab (PROLIA) 60 MG/ML SOSY injection Inject 60 mg into the skin every 6 (six) months.  . donepezil (ARICEPT) 5 MG tablet Take 5 mg by mouth at bedtime.  . DULoxetine (CYMBALTA) 30 MG capsule Take 30 mg by mouth daily.  . Fluticasone-Umeclidin-Vilant (TRELEGY ELLIPTA) 100-62.5-25 MCG/INH AEPB One puff inhalation once a day   . Folic Acid 5 MG CAPS Take 5 mg by mouth every Sunday.  . furosemide (LASIX) 40 MG tablet Take 40 mg by mouth daily.  Marland Kitchen guaiFENesin (MUCINEX) 600 MG 12 hr tablet Take 600 mg by mouth 2 (two) times daily.  . Menthol, Topical Analgesic, (BIOFREEZE) 4 % GEL Apply to both knees  daily as needed  . Menthol, Topical Analgesic, (BIOFREEZE) 4 % GEL Apply to bilateral  shoulders Once A Day PRN  . methotrexate (RHEUMATREX) 2.5 MG tablet Take 12.5 mg by mouth once a week. On Sunday - Caution:Chemotherapy. Protect from light.  . NON FORMULARY Diet Type:  NAS, Regular  Consistent Carbohydrates, Dysphagia 3  . OXYGEN Inhale 3.5 L/min into the lungs continuous.  . pantoprazole (PROTONIX) 40 MG tablet Take 40 mg by mouth 2 (two) times daily.  . phenylephrine-shark liver oil-mineral oil-petrolatum (PREPARATION H) 0.25-3-14-71.9 % rectal ointment Place 1 application rectally 2 (two) times daily as needed for hemorrhoids.  . polyethylene glycol (MIRALAX / GLYCOLAX) packet Take 17 g by mouth daily as needed for moderate constipation.  . potassium chloride SA (K-DUR,KLOR-CON) 20 MEQ tablet Take 2 tablets (40 mg ) by mouth twice a day  . pregabalin  (LYRICA) 50 MG capsule Take 1 capsule by mouth  Twice daily  . rosuvastatin (CRESTOR) 20 MG tablet Take 20 mg by mouth daily.  . traMADol (ULTRAM) 50 MG tablet Take 1 tablet (50 mg total) by mouth 2 (two) times daily.   No facility-administered  encounter medications on file as of 08/28/2019.     SIGNIFICANT DIAGNOSTIC EXAMS  PREVIOUS;   01-07-19: chest x-ray; COPD. Aortic atherosclerosis no acute pathology    01-20-19: DEXA: t score -4.997  NO NEW EXAMS.    LABS REVIEWED PREVIOUS :   09-26-18: wbc 8.3; hgb 16.2; hct 52.3 mcv 93.7; plt 156 11-02-18: glucose 117; bun 16; creat 1.02; k+ 4.2; na++ 142; ca 9.6 ;liver normal albumin 3.5; hgb a1c 5.8; urine micro-albumin 4.3   03-20-19: wbc 4.8; hgb 13.9; hct 44.9; mcv 95.7 plt 97; glucose 168; bun 21; creat 1.00 ;k+ 3.9; na++ 138; ca 9.5 liver normal albumin 3.2  03-27-19: wbc 6.1; hgb 15.1; hct 48.1; mcv 94.9 plt 121  05-17-19: wbc 5.9; hgb 14.1; hct 45.8; mcv 92. 5 plt 112; glucose 134; bun 18; creat 0.94; k+ 3.9; na++ 140; ca 9.9 liver normal albumin 3.3; chol 130 ldl 68; trig 102; hdl 42; hgb a1c 6.4   NO NEW LABS.    Review of Systems  Constitutional: Negative for malaise/fatigue.  Respiratory: Negative for cough and shortness of breath.   Cardiovascular: Negative for chest pain, palpitations and leg swelling.  Gastrointestinal: Negative for abdominal pain, constipation and heartburn.  Musculoskeletal: Negative for back pain, joint pain and myalgias.  Skin: Negative.   Neurological: Negative for dizziness.  Psychiatric/Behavioral: The patient is not nervous/anxious.      Physical Exam Constitutional:      General: She is not in acute distress.    Appearance: She is well-developed. She is not diaphoretic.  Neck:     Thyroid: No thyromegaly.  Cardiovascular:     Rate and Rhythm: Normal rate and regular rhythm.     Pulses: Normal pulses.     Heart sounds: Normal heart sounds.  Pulmonary:     Effort: Pulmonary effort is normal.  No respiratory distress.     Breath sounds: Normal breath sounds.  Abdominal:     General: Bowel sounds are normal. There is no distension.     Palpations: Abdomen is soft.     Tenderness: There is no abdominal tenderness.  Musculoskeletal:     Cervical back: Neck supple.     Right lower leg: No edema.     Left lower leg: No edema.     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  Skin:    General: Skin is warm and dry.  Neurological:     Mental Status: She is alert. Mental status is at baseline.  Psychiatric:        Mood and Affect: Mood normal.       ASSESSMENT/ PLAN:  TODAY  1. Major depression recurrent chronic 2. Fibromyalgia  Will continue ultram 50 mg twice daily  lyrica 50 mg twice daily  Will lower cymbalta to 20 mg daily  Will monitor her status.    MD is aware of resident's narcotic use and is in agreement with current plan of care. We will attempt to wean resident as appropriate.  Ok Edwards NP [facility]  Contact [phone] Monday through Friday 8am- 5pm  After hours call [phone]

## 2019-09-03 ENCOUNTER — Non-Acute Institutional Stay (SKILLED_NURSING_FACILITY): Payer: Medicare Other | Admitting: Adult Health

## 2019-09-03 ENCOUNTER — Encounter: Payer: Self-pay | Admitting: Adult Health

## 2019-09-03 DIAGNOSIS — E1122 Type 2 diabetes mellitus with diabetic chronic kidney disease: Secondary | ICD-10-CM | POA: Diagnosis not present

## 2019-09-03 DIAGNOSIS — N183 Chronic kidney disease, stage 3 unspecified: Secondary | ICD-10-CM | POA: Diagnosis not present

## 2019-09-03 DIAGNOSIS — M797 Fibromyalgia: Secondary | ICD-10-CM

## 2019-09-03 DIAGNOSIS — E1142 Type 2 diabetes mellitus with diabetic polyneuropathy: Secondary | ICD-10-CM | POA: Diagnosis not present

## 2019-09-03 NOTE — Progress Notes (Signed)
Location:    [place] Room Number: 106/D Place of Service:  SNF (31)   CODE STATUS: DNR  Allergies  Allergen Reactions  . Other     Band Aids   . Tape   . Lipitor [Atorvastatin Calcium] Rash and Other (See Comments)    Muscle pain  . Niaspan [Niacin Er] Rash and Other (See Comments)    Muscle pain    Chief Complaint  Patient presents with  . Medical Management of Chronic Issues       CKD stage 3 due to type 2 diabetes mellitus:  Diabetic peripheral neuropathy associated with type 2 diabetes mellitus  Fibromyalgia:     HPI:  She is a 86 year old long term resident of this facility being seen for the management of her chronic illnesses; ckd; peripheral neuropathy; fibromyalgia. There are no reports of uncontrolled pain; she has tolerated her wean on the cymbalta without difficulty at this time. There are no reports of anxiety or agitation.   Past Medical History:  Diagnosis Date  . Anemia   . Atrial fibrillation ([place])   . Back pain   . Bacterial pneumonia   . Cancer (HCC)    Skin   . Chronic respiratory failure ([place])   . Cognitive communication deficit   . COPD (chronic obstructive pulmonary disease) ([place])   . Dementia ([place])   . Dementia ([place])   . Depression   . Diabetes mellitus without complication ([place])   . Diverticulosis   . Diverticulosis   . Fatty liver   . Fibromyalgia   . Fibromyalgia   . Gastric polyp 09/08/11   at the anastomosis-inflammatory  . Generalized anxiety disorder   . Generalized anxiety disorder   . GERD (gastroesophageal reflux disease)   . GERD (gastroesophageal reflux disease)   . 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 Hypercholesteremia   . Hypertension   . Insomnia   . Internal hemorrhoids   . Muscle weakness   . Neuropathy   . On home O2    qhs  . Osteoporosis   . Rheumatoid arteritis ([place])   . Ulcer    stomach  . Vitamin D deficiency     Past Surgical History:  Procedure Laterality Date  .  ABDOMINAL HYSTERECTOMY    . ABDOMINAL SURGERY     removed patial stomach from ulcer  . COLONOSCOPY  04-2001   internal hemorrhoids, panocolonic diverticulosis, and colon polyps   . UPPER GASTROINTESTINAL ENDOSCOPY      Social History   Socioeconomic History  . Marital status: Widowed    Spouse name: Not on file  . Number of children: 6  . Years of education: Not on file  . Highest education level: Not on file  Occupational History  . Occupation: Retired  Tobacco Use  . Smoking status: Never Smoker  . Smokeless tobacco: Never Used  Vaping Use  . Vaping Use: Never used  Substance and Sexual Activity  . Alcohol use: No  . Drug use: No  . Sexual activity: Not Currently  Other Topics Concern  . Not on file  Social History Narrative   Long term resident of [facility]    Social Determinants of Health   Financial Resource Strain:   . Difficulty of Paying Living Expenses:   Food Insecurity:   . Worried About Charity fundraiser in the Last Year:   . Arboriculturist in the Last Year:   Transportation Needs:   . Lack  of Transportation (Medical):   Marland Kitchen Lack of Transportation (Non-Medical):   Physical Activity:   . Days of Exercise per Week:   . Minutes of Exercise per Session:   Stress:   . Feeling of Stress :   Social Connections: Unknown  . Frequency of Communication with Friends and Family: Never  . Frequency of Social Gatherings with Friends and Family: Never  . Attends Religious Services: Not on file  . Active Member of Clubs or Organizations: Not on file  . Attends Archivist Meetings: Not on file  . Marital Status: Not on file  Intimate Partner Violence:   . Fear of Current or Ex-Partner:   . Emotionally Abused:   Marland Kitchen Physically Abused:   . Sexually Abused:    Family History  Problem Relation Age of Onset  . Heart disease Father   . Kidney disease Mother        kidney cancer  . Colon cancer Daughter 19      VITAL SIGNS BP (!) 142/76   Pulse 74   Temp  97.6 F (36.4 C) (Oral)   Ht 5\' 3"  (1.6 m)   Wt 214 lb (97.1 kg)   SpO2 91%   BMI 37.91 kg/m   Outpatient Encounter Medications as of 09/03/2019  Medication Sig  . acetaminophen (TYLENOL) 325 MG tablet Take 650 mg by mouth every 8 (eight) hours as needed. do not exceed 3g/24 hours of apap considering all sources  . acetaminophen (TYLENOL) 650 MG CR tablet Take 650 mg by mouth every 6 (six) hours.  Marland Kitchen apixaban (ELIQUIS) 2.5 MG TABS tablet Take 2.5 mg by mouth 2 (two) times daily.  Marland Kitchen CARTIA XT 300 MG 24 hr capsule TAKE ONE CAPSULE BY MOUTH EVERY DAY  . denosumab (PROLIA) 60 MG/ML SOSY injection Inject 60 mg into the skin every 6 (six) months.  . donepezil (ARICEPT) 5 MG tablet Take 5 mg by mouth at bedtime.  . DULoxetine (CYMBALTA) 20 MG capsule Take 20 mg by mouth daily.  . Fluticasone-Umeclidin-Vilant (TRELEGY ELLIPTA) 100-62.5-25 MCG/INH AEPB One puff inhalation once a day   . Folic Acid 5 MG CAPS Take 5 mg by mouth every Sunday.  . furosemide (LASIX) 40 MG tablet Take 40 mg by mouth daily.  Marland Kitchen guaiFENesin (MUCINEX) 600 MG 12 hr tablet Take 600 mg by mouth 2 (two) times daily.  . Menthol, Topical Analgesic, (BIOFREEZE) 4 % GEL Apply to both knees  daily as needed  . Menthol, Topical Analgesic, (BIOFREEZE) 4 % GEL Apply to bilateral  shoulders Once A Day PRN  . methotrexate (RHEUMATREX) 2.5 MG tablet Take 12.5 mg by mouth once a week. On Sunday - Caution:Chemotherapy. Protect from light.  . NON FORMULARY Diet Type:  NAS, Regular  Consistent Carbohydrates, Dysphagia 3  . OXYGEN Inhale 3.5 L/min into the lungs continuous.  . pantoprazole (PROTONIX) 40 MG tablet Take 40 mg by mouth 2 (two) times daily.  . phenylephrine-shark liver oil-mineral oil-petrolatum (PREPARATION H) 0.25-3-14-71.9 % rectal ointment Place 1 application rectally 2 (two) times daily as needed for hemorrhoids.  . polyethylene glycol (MIRALAX / GLYCOLAX) packet Take 17 g by mouth daily as needed for moderate constipation.  .  potassium chloride SA (K-DUR,KLOR-CON) 20 MEQ tablet Take 2 tablets (40 mg ) by mouth twice a day  . pregabalin (LYRICA) 50 MG capsule Take 1 capsule by mouth  Twice daily  . rosuvastatin (CRESTOR) 20 MG tablet Take 20 mg by mouth daily.  . traMADol (ULTRAM) 50  MG tablet Take 1 tablet (50 mg total) by mouth 2 (two) times daily.  . [DISCONTINUED] DULoxetine (CYMBALTA) 30 MG capsule Take 30 mg by mouth daily.   No facility-administered encounter medications on file as of 09/03/2019.     SIGNIFICANT DIAGNOSTIC EXAMS   PREVIOUS;   01-07-19: chest x-ray; COPD. Aortic atherosclerosis no acute pathology    01-20-19: DEXA: t score -4.997  NO NEW EXAMS.    LABS REVIEWED PREVIOUS :   09-26-18: wbc 8.3; hgb 16.2; hct 52.3 mcv 93.7; plt 156 11-02-18: glucose 117; bun 16; creat 1.02; k+ 4.2; na++ 142; ca 9.6 ;liver normal albumin 3.5; hgb a1c 5.8; urine micro-albumin 4.3   03-20-19: wbc 4.8; hgb 13.9; hct 44.9; mcv 95.7 plt 97; glucose 168; bun 21; creat 1.00 ;k+ 3.9; na++ 138; ca 9.5 liver normal albumin 3.2  03-27-19: wbc 6.1; hgb 15.1; hct 48.1; mcv 94.9 plt 121  05-17-19: wbc 5.9; hgb 14.1; hct 45.8; mcv 92. 5 plt 112; glucose 134; bun 18; creat 0.94; k+ 3.9; na++ 140; ca 9.9 liver normal albumin 3.3; chol 130 ldl 68; trig 102; hdl 42; hgb a1c 6.4   TODAY  08-15-19: urine micro-albumin 5.0   Review of Systems  Constitutional: Negative for malaise/fatigue.  Respiratory: Negative for cough and shortness of breath.   Cardiovascular: Negative for chest pain, palpitations and leg swelling.  Gastrointestinal: Negative for abdominal pain, constipation and heartburn.  Musculoskeletal: Negative for back pain, joint pain and myalgias.  Skin: Negative.   Neurological: Negative for dizziness.  Psychiatric/Behavioral: The patient is not nervous/anxious.      Physical Exam Constitutional:      General: She is not in acute distress.    Appearance: She is well-developed. She is not diaphoretic.  Neck:       Thyroid: No thyromegaly.  Cardiovascular:     Rate and Rhythm: Normal rate and regular rhythm.     Pulses: Normal pulses.     Heart sounds: Normal heart sounds.  Pulmonary:     Effort: Pulmonary effort is normal. No respiratory distress.     Breath sounds: Normal breath sounds.  Abdominal:     General: Bowel sounds are normal. There is no distension.     Palpations: Abdomen is soft.     Tenderness: There is no abdominal tenderness.  Musculoskeletal:     Cervical back: Neck supple.     Right lower leg: No edema.     Left lower leg: No edema.     Comments: Unable to move left upper extremity due to frozen left shoulder Able to move other extremities        Lymphadenopathy:     Cervical: No cervical adenopathy.  Skin:    General: Skin is warm and dry.  Neurological:     Mental Status: She is alert. Mental status is at baseline.  Psychiatric:        Mood and Affect: Mood normal.        ASSESSMENT/ PLAN:  TODAY:   1. CKD stage 3 due to type 2 diabetes mellitus: is stable bun 18; creat 0.94 will monitor   2. Diabetic peripheral neuropathy associated with type 2 diabetes mellitus is stable will continue lyrica 50 mg twice daily   3. Fibromyalgia: is stable will continue lyrica 50 mg twice daily cymbalta 20 mg daily    PREVIOUS   4. Rheumatoid arteritis: is stable will continue methotrexate 12.5 mg weekly and folic acid 5 mg weekly   5. Post-menopausal osteoporosis:  is stable t score -4.997 (01-20-19)  Will continue proli 60 mg every 6 months  6. Chronic constipation: is table will continue miralax daily sa needed  7. Type 2 diabetes mellitus with peripheral neuropatyh: is stable hgb a1c 6.4 is off tradjenta will monitor   8. Dyslipidemia associated with type 2 diabetes mellitus is stable LDL 68 will continue crestor 20 mg daily   9. Atria fibrillation with RVR: heart rate is stable will continue cartia xt 300 mg daily for rate control and eliquis 2.5 mg twice daily    10. Chronic diastolic heart failure EF 55-60% (07-28-16) will continue lasix 40 mg daily with k+ 40 meq twice daily    11. Hypokalemia is stable k+ 3.9 will continue k+ 40 meq twice daily   12. Unspecified COPD/ chronic respiratory failure with hypoxia: is stable is 02 dependent: will continue trelegy ellipta 100-62,5-25 mcg 1 puff daily albuterol 2 puffs every 6 hours as needed  13. GERD without esophagitis: is stable will continue protonix 40 mg twice daily   14. Vascular dementia without behavioral disturbance: is without change weight is 214 pounds will continue aricept 5 mg daily          MD is aware of resident's narcotic use and is in agreement with current plan of care. We will attempt to wean resident as appropriate.  Ok Edwards NP [facility]  Contact [phone] Monday through Friday 8am- 5pm  After hours call [phone]

## 2019-09-26 ENCOUNTER — Encounter: Payer: Self-pay | Admitting: Adult Health

## 2019-09-26 ENCOUNTER — Non-Acute Institutional Stay (SKILLED_NURSING_FACILITY): Payer: Medicare Other | Admitting: Adult Health

## 2019-09-26 DIAGNOSIS — M0520 Rheumatoid vasculitis with rheumatoid arthritis of unspecified site: Secondary | ICD-10-CM

## 2019-09-26 DIAGNOSIS — K5909 Other constipation: Secondary | ICD-10-CM | POA: Diagnosis not present

## 2019-09-26 DIAGNOSIS — M810 Age-related osteoporosis without current pathological fracture: Secondary | ICD-10-CM

## 2019-09-26 NOTE — Progress Notes (Signed)
Location:    [facility] Room Number: 106/D Place of Service:  SNF (31)   CODE STATUS: DNR  Allergies  Allergen Reactions   Other     Band Aids    Tape    Lipitor [Atorvastatin Calcium] Rash and Other (See Comments)    Muscle pain   Niaspan [Niacin Er] Rash and Other (See Comments)    Muscle pain    Chief Complaint  Patient presents with   Medical Management of Chronic Issues         Rheumatoid arteritis:    Post-menopausal osteoporosis  Chronic constipation:    HPI:  She is a 86 year old long term resident of this facility being seen for the management of her chronic illnesses: Rheumatoid arteritis:    Post-menopausal osteoporosis  Chronic constipation. There are no reports of uncontrolled pain; no reports of constipation. There are no reports of anxiety or agitation.   Past Medical History:  Diagnosis Date   Anemia    Atrial fibrillation (HCC)    Back pain    Bacterial pneumonia    Cancer ([place])    Skin    Chronic respiratory failure ([place])    Cognitive communication deficit    COPD (chronic obstructive pulmonary disease) (HCC)    Dementia (HCC)    Dementia ([place])    Depression    Diabetes mellitus without complication ([place])    Diverticulosis    Diverticulosis    Fatty liver    Fibromyalgia    Fibromyalgia    Gastric polyp 09/08/11   at the anastomosis-inflammatory   Generalized anxiety disorder    Generalized anxiety disorder    GERD (gastroesophageal reflux disease)    GERD (gastroesophageal reflux disease)    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)    Hypercholesteremia    Hypertension    Insomnia    Internal hemorrhoids    Muscle weakness    Neuropathy    On home O2    qhs   Osteoporosis    Rheumatoid arteritis ([place])    Ulcer    stomach   Vitamin D deficiency     Past Surgical History:  Procedure Laterality Date   ABDOMINAL HYSTERECTOMY     ABDOMINAL SURGERY     removed patial  stomach from ulcer   COLONOSCOPY  04-2001   internal hemorrhoids, panocolonic diverticulosis, and colon polyps    UPPER GASTROINTESTINAL ENDOSCOPY      Social History   Socioeconomic History   Marital status: Widowed    Spouse name: Not on file   Number of children: 6   Years of education: Not on file   Highest education level: Not on file  Occupational History   Occupation: Retired  Tobacco Use   Smoking status: Never Smoker   Smokeless tobacco: Never Used  Scientific laboratory technician Use: Never used  Substance and Sexual Activity   Alcohol use: No   Drug use: No   Sexual activity: Not Currently  Other Topics Concern   Not on file  Social History Narrative   Long term resident of [facility]    Social Determinants of Health   Financial Resource Strain:    Difficulty of Paying Living Expenses:   Food Insecurity:    Worried About Charity fundraiser in the Last Year:    Arboriculturist in the Last Year:   Transportation Needs:    Film/video editor (Medical):    Lack of Transportation (Non-Medical):   Physical  Activity:    Days of Exercise per Week:    Minutes of Exercise per Session:   Stress:    Feeling of Stress :   Social Connections: Unknown   Frequency of Communication with Friends and Family: Never   Frequency of Social Gatherings with Friends and Family: Never   Attends Religious Services: Not on Electrical engineer or Organizations: Not on file   Attends Archivist Meetings: Not on file   Marital Status: Not on file  Intimate Partner Violence:    Fear of Current or Ex-Partner:    Emotionally Abused:    Physically Abused:    Sexually Abused:    Family History  Problem Relation Age of Onset   Heart disease Father    Kidney disease Mother        kidney cancer   Colon cancer Daughter 26      VITAL SIGNS BP 128/76    Pulse 74    Temp 97.7 F (36.5 C) (Oral)    Ht 5\' 3"  (1.6 m)    Wt 213 lb 9.6 oz (96.9  kg)    SpO2 94%    BMI 37.84 kg/m   Outpatient Encounter Medications as of 09/26/2019  Medication Sig   acetaminophen (TYLENOL) 325 MG tablet Take 650 mg by mouth every 8 (eight) hours as needed. do not exceed 3g/24 hours of apap considering all sources   acetaminophen (TYLENOL) 650 MG CR tablet Take 650 mg by mouth every 6 (six) hours.   apixaban (ELIQUIS) 2.5 MG TABS tablet Take 2.5 mg by mouth 2 (two) times daily.   CARTIA XT 300 MG 24 hr capsule TAKE ONE CAPSULE BY MOUTH EVERY DAY   denosumab (PROLIA) 60 MG/ML SOSY injection Inject 60 mg into the skin every 6 (six) months.   donepezil (ARICEPT) 5 MG tablet Take 5 mg by mouth at bedtime.   DULoxetine (CYMBALTA) 20 MG capsule Take 20 mg by mouth daily.   Fluticasone-Umeclidin-Vilant (TRELEGY ELLIPTA) 100-62.5-25 MCG/INH AEPB One puff inhalation once a day    Folic Acid 5 MG CAPS Take 5 mg by mouth every Sunday.   furosemide (LASIX) 40 MG tablet Take 40 mg by mouth daily.   guaiFENesin (MUCINEX) 600 MG 12 hr tablet Take 600 mg by mouth 2 (two) times daily.   Menthol, Topical Analgesic, (BIOFREEZE) 4 % GEL Apply to both knees  daily as needed   Menthol, Topical Analgesic, (BIOFREEZE) 4 % GEL Apply to bilateral  shoulders Once A Day PRN   methotrexate (RHEUMATREX) 2.5 MG tablet Take 12.5 mg by mouth once a week. On Sunday - Caution:Chemotherapy. Protect from light.   NON FORMULARY Diet Type:  NAS, Regular  Consistent Carbohydrates, Dysphagia 3   OXYGEN Inhale 3.5 L/min into the lungs continuous.   pantoprazole (PROTONIX) 40 MG tablet Take 40 mg by mouth 2 (two) times daily.   phenylephrine-shark liver oil-mineral oil-petrolatum (PREPARATION H) 0.25-3-14-71.9 % rectal ointment Place 1 application rectally 2 (two) times daily as needed for hemorrhoids.   polyethylene glycol (MIRALAX / GLYCOLAX) packet Take 17 g by mouth daily as needed for moderate constipation.   potassium chloride SA (K-DUR,KLOR-CON) 20 MEQ tablet Take 2  tablets (40 mg ) by mouth twice a day   pregabalin (LYRICA) 50 MG capsule Take 1 capsule by mouth  Twice daily   rosuvastatin (CRESTOR) 20 MG tablet Take 20 mg by mouth daily.   traMADol (ULTRAM) 50 MG tablet Take 1 tablet (50  mg total) by mouth 2 (two) times daily.   No facility-administered encounter medications on file as of 09/26/2019.     SIGNIFICANT DIAGNOSTIC EXAMS   PREVIOUS;   01-07-19: chest x-ray; COPD. Aortic atherosclerosis no acute pathology    01-20-19: DEXA: t score -4.997  NO NEW EXAMS.    LABS REVIEWED PREVIOUS :   09-26-18: wbc 8.3; hgb 16.2; hct 52.3 mcv 93.7; plt 156 11-02-18: glucose 117; bun 16; creat 1.02; k+ 4.2; na++ 142; ca 9.6 ;liver normal albumin 3.5; hgb a1c 5.8; urine micro-albumin 4.3   03-20-19: wbc 4.8; hgb 13.9; hct 44.9; mcv 95.7 plt 97; glucose 168; bun 21; creat 1.00 ;k+ 3.9; na++ 138; ca 9.5 liver normal albumin 3.2  03-27-19: wbc 6.1; hgb 15.1; hct 48.1; mcv 94.9 plt 121  05-17-19: wbc 5.9; hgb 14.1; hct 45.8; mcv 92. 5 plt 112; glucose 134; bun 18; creat 0.94; k+ 3.9; na++ 140; ca 9.9 liver normal albumin 3.3; chol 130 ldl 68; trig 102; hdl 42; hgb a1c 6.4  08-15-19: urine micro-albumin 5.0   NO NEW LABS.   Review of Systems  Constitutional: Negative for malaise/fatigue.  Respiratory: Negative for cough and shortness of breath.   Cardiovascular: Negative for chest pain, palpitations and leg swelling.  Gastrointestinal: Negative for abdominal pain, constipation and heartburn.  Musculoskeletal: Negative for back pain, joint pain and myalgias.  Skin: Negative.   Neurological: Negative for dizziness.  Psychiatric/Behavioral: The patient is not nervous/anxious.       Physical Exam Constitutional:      General: She is not in acute distress.    Appearance: She is well-developed. She is not diaphoretic.  Neck:     Thyroid: No thyromegaly.  Cardiovascular:     Rate and Rhythm: Normal rate and regular rhythm.     Pulses: Normal pulses.      Heart sounds: Normal heart sounds.  Pulmonary:     Effort: Pulmonary effort is normal. No respiratory distress.     Breath sounds: Normal breath sounds.  Abdominal:     General: Bowel sounds are normal. There is no distension.     Palpations: Abdomen is soft.     Tenderness: There is no abdominal tenderness.  Musculoskeletal:     Cervical back: Neck supple.     Right lower leg: No edema.     Left lower leg: No edema.     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  Skin:    General: Skin is warm and dry.  Neurological:     Mental Status: She is alert. Mental status is at baseline.  Psychiatric:        Mood and Affect: Mood normal.      ASSESSMENT/ PLAN:  TODAY:   1. Rheumatoid arteritis: is stable will continue methotrexate 64.3 mg weekly folic acid 5 mg weekly   2. Post-menopausal osteoporosis is stable t score -4.997 (01-20-19) will continue prolia 60 mg every 6 months  3. Chronic constipation: is stable will continue miralax daily as needed   PREVIOUS   4. 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 Dyslipidemia associated with type 2 diabetes mellitus is stable LDL 68 will continue crestor 20 mg daily   6. Atrial fibrillation with RVR: heart rate is stable will continue  cartia xt 300 mg daily for rate control and eliquis 2.5 mg twice daily   7. Chronic diastolic heart failure EF 55-60% (07-28-16) will continue lasix 40 mg daily with k+ 40 meq twice daily    8. Hypokalemia is stable k+ 3.9 will continue k+ 40 meq twice daily   9. Unspecified COPD/ chronic respiratory failure with hypoxia: is stable is 02 dependent: will continue trelegy ellipta 100-62,5-25 mcg 1 puff daily albuterol 2 puffs every 6 hours as needed  10. GERD without esophagitis: is stable will continue protonix 40  mg twice daily   12. Vascular dementia without behavioral disturbance: is without change weight is 214 pounds will continue aricept 5 mg daily   13. CKD stage 3 due to type 2 diabetes mellitus: is stable bun 18; creat 0.94 will monitor   14. Diabetic peripheral neuropathy associated with type 2 diabetes mellitus is stable will continue lyrica 50 mg twice daily   15. Fibromyalgia: is stable will continue lyrica 50 mg twice daily cymbalta 20 mg daily      MD is aware of resident's narcotic use and is in agreement with current plan of care. We will attempt to wean resident as appropriate.  Ok Edwards NP [facility]  Contact [phone] Monday through Friday 8am- 5pm  After hours call [phone]

## 2019-09-27 ENCOUNTER — Other Ambulatory Visit: Payer: Self-pay | Admitting: Adult Health

## 2019-09-27 MED ORDER — TRAMADOL HCL 50 MG PO TABS: 50.0000 mg | ORAL_TABLET | Freq: Two times a day (BID) | ORAL | 0 refills | Status: DC

## 2019-09-27 MED ORDER — PREGABALIN 50 MG PO CAPS: ORAL_CAPSULE | ORAL | 0 refills | Status: DC

## 2019-10-04 ENCOUNTER — Encounter: Payer: Self-pay | Admitting: Internal Medicine

## 2019-10-04 ENCOUNTER — Non-Acute Institutional Stay (SKILLED_NURSING_FACILITY): Payer: Medicare Other | Admitting: Internal Medicine

## 2019-10-04 DIAGNOSIS — J441 Chronic obstructive pulmonary disease with (acute) exacerbation: Secondary | ICD-10-CM

## 2019-10-04 NOTE — Patient Instructions (Signed)
See assessment and plan acutely for this visit ? ?

## 2019-10-04 NOTE — Progress Notes (Signed)
   NURSING HOME LOCATION:  [facility] ROOM NUMBER:  [id number]  CODE STATUS: DNR   PCP:  Ok Edwards NP   This is a nursing facility follow up for specific acute issue of cough and wheezing.   Interim medical record and care since last [place] visit was updated with review of diagnostic studies and change in clinical status since last visit were documented.  HPI: With minimal exertion the patient exhibits cough and wheezing as per her primary Nurse. She has a history of COPD as well as history of chronic respiratory failure.  She is on triple agent, Trelegy as maintenance medication.  She has never been a smoker. She validates " can't get enough breath" but can provide no meaningful history due to dementia. She can not even provide year when asked date. She asked my name 3 times. Labs and imaging are not current in Epic.  She has had documented cardiomegaly as well as increased interstitial markings.  Physical exam:  Pertinent or positive findings: She is markedly hard of hearing.  She has a meaty facies of a chronic bronchitic.  Facies are markedly wrinkled.  Ptosis is greater on the left than the right.  She has a large nevus inferomedially to the left eye.  Nasal O2 in place.  She has complete dentures.  She exhibits a deep, hoarse cough which is nonproductive.  She exhibits diffuse rhonchi & rales.  Heart sounds are somewhat obscured. Abdomen is protuberant. Pedal pulses are strong. She has nonpitting edema of the legs.  Fusiform changes of the knees are noted.  She has scattered bruising over the lower extremities.  Clubbing of the nailbeds is suggested.  She has DIP osteoarthritic changes.  There is a slight tremor of the right hand.  General appearance: Adequately nourished; no acute distress, increased work of breathing is present.   Lymphatic: No lymphadenopathy about the head, neck, axilla. Eyes: No conjunctival inflammation or lid edema is present. There is no  scleral icterus. Ears:  External ear exam shows no significant lesions or deformities.   Nose:  External nasal examination shows no deformity or inflammation. Nasal mucosa are pink and moist without lesions, exudates Oral exam:  Lips and gums are healthy appearing. There is no oropharyngeal erythema or exudate. Neck:  No thyromegaly, masses, tenderness noted.    Heart:  No gallop, murmur, click, rub .  Lungs: without wheezes, rubs. Abdomen: Bowel sounds are normal. Abdomen is protuberant but soft and nontender with no organomegaly, hernias, masses. GU: Deferred  Extremities:  No cyanosis Neurologic exam :Balance, Rhomberg, finger to nose testing could not be completed due to clinical state Skin: Warm & dry w/o tenting. No significant lesions or rash.  See summary under each active problem in the Problem List with associated updated therapeutic plan

## 2019-10-04 NOTE — Assessment & Plan Note (Addendum)
She is having an acute exacerbation of chronic bronchitis.  She will receive Rocephin and azithromycin as well as short course of prednisone.  Imaging will be performed if there is failure to improve.  At this time the abnormal breath sounds are diffusely homogenous without asymmetric or localized findings.

## 2019-10-07 ENCOUNTER — Non-Acute Institutional Stay (SKILLED_NURSING_FACILITY): Payer: Medicare Other | Admitting: Adult Health

## 2019-10-07 ENCOUNTER — Encounter: Payer: Self-pay | Admitting: Adult Health

## 2019-10-07 DIAGNOSIS — J441 Chronic obstructive pulmonary disease with (acute) exacerbation: Secondary | ICD-10-CM

## 2019-10-07 NOTE — Progress Notes (Signed)
Location:    [facility] Room Number: 106/D Place of Service:  SNF (31)   CODE STATUS: DNR  Allergies  Allergen Reactions   Other     Band Aids    Tape    Lipitor [Atorvastatin Calcium] Rash and Other (See Comments)    Muscle pain   Niaspan [Niacin Er] Rash and Other (See Comments)    Muscle pain    Chief Complaint  Patient presents with   Acute Visit    Pneumonina    HPI:  She is presently being treated for pneumonia with zithromax. She does complain of increased shortness of breath; and has had a hard time sleeping due to her shortness of breath. She does complain of wheezing present. There are no reports of fevers present.   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)    Hypercholesteremia    Hypertension    Insomnia    Internal hemorrhoids    Muscle weakness    Neuropathy    On home O2    qhs   Osteoporosis    Rheumatoid arteritis ([place])    Ulcer    stomach   Vitamin D deficiency     Past Surgical History:  Procedure Laterality Date   ABDOMINAL HYSTERECTOMY     ABDOMINAL SURGERY     removed patial stomach from ulcer   COLONOSCOPY  04-2001   internal hemorrhoids, panocolonic diverticulosis, and colon polyps    UPPER GASTROINTESTINAL ENDOSCOPY      Social History   Socioeconomic History   Marital status: Widowed    Spouse name: Not on file   Number of children: 6     Years of education: Not on file   Highest education level: Not on file  Occupational History   Occupation: Retired  Tobacco Use   Smoking status: Never Smoker   Smokeless tobacco: Never Used  Scientific laboratory technician Use: Never used  Substance and Sexual Activity   Alcohol use: No   Drug use: No   Sexual activity: Not Currently  Other Topics Concern   Not on file  Social History Narrative   Long term resident of [facility]    Social Determinants of Health   Financial Resource Strain:    Difficulty of Paying Living Expenses:   Food Insecurity:    Worried About Charity fundraiser in the Last Year:    Arboriculturist in the Last Year:   Transportation Needs:    Film/video editor (Medical):    Lack of Transportation (Non-Medical):   Physical Activity:    Days of Exercise per Week:    Minutes of Exercise per Session:   Stress:    Feeling of Stress :   Social Connections: Unknown   Frequency of Communication with Friends and Family: Never   Frequency of Social Gatherings with  Friends and Family: Never   Attends Religious Services: Not on file   Active Member of Clubs or Organizations: Not on file   Attends Archivist Meetings: Not on file   Marital Status: Not on file  Intimate Partner Violence:    Fear of Current or Ex-Partner:    Emotionally Abused:    Physically Abused:    Sexually Abused:    Family History  Problem Relation Age of Onset   Heart disease Father    Kidney disease Mother        kidney cancer   Colon cancer Daughter 12      VITAL SIGNS BP 126/78    Pulse 65    Temp 97.6 F (36.4 C) (Oral)    Ht 5\' 3"  (1.6 m)    Wt (!) 213 lb 9.6 oz (96.9 kg)    SpO2 99%    BMI 37.84 kg/m   Outpatient Encounter Medications as of 10/07/2019  Medication Sig   acetaminophen (TYLENOL) 650 MG CR tablet Take 650 mg by mouth every 6 (six) hours.   apixaban (ELIQUIS) 2.5 MG TABS tablet Take 2.5 mg by mouth 2 (two) times daily.    azithromycin (ZITHROMAX) 250 MG tablet Take 250 mg by mouth daily.   CARTIA XT 300 MG 24 hr capsule TAKE ONE CAPSULE BY MOUTH EVERY DAY   denosumab (PROLIA) 60 MG/ML SOSY injection Inject 60 mg into the skin every 6 (six) months.   donepezil (ARICEPT) 5 MG tablet Take 5 mg by mouth at bedtime.   DULoxetine (CYMBALTA) 20 MG capsule Take 20 mg by mouth daily.   Fluticasone-Umeclidin-Vilant (TRELEGY ELLIPTA) 100-62.5-25 MCG/INH AEPB One puff inhalation once a day    Folic Acid 5 MG CAPS Take 5 mg by mouth every Sunday.   furosemide (LASIX) 40 MG tablet Take 40 mg by mouth daily.   guaiFENesin (MUCINEX) 600 MG 12 hr tablet Take 600 mg by mouth 2 (two) times daily.   ipratropium-albuterol (DUONEB) 0.5-2.5 (3) MG/3ML SOLN Take 3 mLs by nebulization every 6 (six) hours. For pneumonia   Menthol, Topical Analgesic, (BIOFREEZE) 4 % GEL Apply to both knees  daily as needed   Menthol, Topical Analgesic, (BIOFREEZE) 4 % GEL Apply to bilateral  shoulders Once A Day PRN   methotrexate (RHEUMATREX) 2.5 MG tablet Take 12.5 mg by mouth once a week. On Sunday - Caution:Chemotherapy. Protect from light.   NON FORMULARY Diet Type:  NAS, Regular  Consistent Carbohydrates, Dysphagia 3   OXYGEN Inhale 3.5 L/min into the lungs continuous.   pantoprazole (PROTONIX) 40 MG tablet Take 40 mg by mouth 2 (two) times daily.   phenylephrine-shark liver oil-mineral oil-petrolatum (PREPARATION H) 0.25-3-14-71.9 % rectal ointment Place 1 application rectally 2 (two) times daily as needed for hemorrhoids.   polyethylene glycol (MIRALAX / GLYCOLAX) packet Take 17 g by mouth daily as needed for moderate constipation.   potassium chloride SA (K-DUR,KLOR-CON) 20 MEQ tablet Take 2 tablets (40 mg ) by mouth twice a day   predniSONE (DELTASONE) 20 MG tablet Take 20 mg by mouth 2 (two) times daily.   pregabalin (LYRICA) 50 MG capsule Take 1 capsule by mouth  Twice daily   rosuvastatin (CRESTOR) 20 MG tablet Take 20 mg  by mouth daily.   traMADol (ULTRAM) 50 MG tablet Take 1 tablet (50 mg total) by mouth 2 (two) times daily.   [DISCONTINUED] acetaminophen (TYLENOL) 325 MG tablet Take 650 mg by mouth every 8 (eight) hours as needed. do  not exceed 3g/24 hours of apap considering all sources   No facility-administered encounter medications on file as of 10/07/2019.     SIGNIFICANT DIAGNOSTIC EXAMS   PREVIOUS;   01-07-19: chest x-ray; COPD. Aortic atherosclerosis no acute pathology    01-20-19: DEXA: t score -4.997  NO NEW EXAMS.    LABS REVIEWED PREVIOUS :   09-26-18: wbc 8.3; hgb 16.2; hct 52.3 mcv 93.7; plt 156 11-02-18: glucose 117; bun 16; creat 1.02; k+ 4.2; na++ 142; ca 9.6 ;liver normal albumin 3.5; hgb a1c 5.8; urine micro-albumin 4.3   03-20-19: wbc 4.8; hgb 13.9; hct 44.9; mcv 95.7 plt 97; glucose 168; bun 21; creat 1.00 ;k+ 3.9; na++ 138; ca 9.5 liver normal albumin 3.2  03-27-19: wbc 6.1; hgb 15.1; hct 48.1; mcv 94.9 plt 121  05-17-19: wbc 5.9; hgb 14.1; hct 45.8; mcv 92. 5 plt 112; glucose 134; bun 18; creat 0.94; k+ 3.9; na++ 140; ca 9.9 liver normal albumin 3.3; chol 130 ldl 68; trig 102; hdl 42; hgb a1c 6.4  08-15-19: urine micro-albumin 5.0   NO NEW LABS.   Review of Systems  Constitutional: Negative for malaise/fatigue.  Respiratory: Positive for shortness of breath and wheezing. Negative for cough.   Cardiovascular: Negative for chest pain, palpitations and leg swelling.  Gastrointestinal: Negative for abdominal pain, constipation and heartburn.  Musculoskeletal: Negative for back pain, joint pain and myalgias.  Skin: Negative.   Neurological: Negative for dizziness.  Psychiatric/Behavioral: The patient is not nervous/anxious.     Physical Exam Constitutional:      General: She is not in acute distress.    Appearance: She is well-developed. She is not diaphoretic.  Neck:     Thyroid: No thyromegaly.  Cardiovascular:     Rate and Rhythm: Normal rate and regular rhythm.      Pulses: Normal pulses.     Heart sounds: Normal heart sounds.  Pulmonary:     Effort: Pulmonary effort is normal. No respiratory distress.     Breath sounds: Wheezing and rhonchi present.     Comments: Uses 02  Abdominal:     General: Bowel sounds are normal. There is no distension.     Palpations: Abdomen is soft.     Tenderness: There is no abdominal tenderness.  Musculoskeletal:     Cervical back: Neck supple.     Right lower leg: No edema.     Left lower leg: No edema.     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  Skin:    General: Skin is warm and dry.  Neurological:     Mental Status: She is alert. Mental status is at baseline.  Psychiatric:        Mood and Affect: Mood normal.       ASSESSMENT/ PLAN:  TODAY  1. COPD with acute exacerbation Is without change in status Will complete zithromax Will begin duoneb every 6 hours through 10-14-19.  Will monitor her status.   MD is aware of resident's narcotic use and is in agreement with current plan of care. We will attempt to wean resident as appropriate.  Ok Edwards NP [facility]  Contact [phone] Monday through Friday 8am- 5pm  After hours call [phone]

## 2019-10-16 DIAGNOSIS — E113293 Type 2 diabetes mellitus with mild nonproliferative diabetic retinopathy without macular edema, bilateral: Secondary | ICD-10-CM | POA: Diagnosis not present

## 2019-10-16 DIAGNOSIS — H43813 Vitreous degeneration, bilateral: Secondary | ICD-10-CM | POA: Diagnosis not present

## 2019-10-16 DIAGNOSIS — Z961 Presence of intraocular lens: Secondary | ICD-10-CM | POA: Diagnosis not present

## 2019-10-16 DIAGNOSIS — H26493 Other secondary cataract, bilateral: Secondary | ICD-10-CM | POA: Diagnosis not present

## 2019-10-16 DIAGNOSIS — H02825 Cysts of left lower eyelid: Secondary | ICD-10-CM | POA: Diagnosis not present

## 2019-10-17 ENCOUNTER — Encounter: Payer: Self-pay | Admitting: Adult Health

## 2019-10-17 ENCOUNTER — Non-Acute Institutional Stay (SKILLED_NURSING_FACILITY): Payer: Medicare Other | Admitting: Adult Health

## 2019-10-17 DIAGNOSIS — F0150 Vascular dementia without behavioral disturbance: Secondary | ICD-10-CM | POA: Diagnosis not present

## 2019-10-17 DIAGNOSIS — J441 Chronic obstructive pulmonary disease with (acute) exacerbation: Secondary | ICD-10-CM | POA: Diagnosis not present

## 2019-10-17 DIAGNOSIS — I4891 Unspecified atrial fibrillation: Secondary | ICD-10-CM

## 2019-10-17 NOTE — Progress Notes (Signed)
Location:    [place] Room Number: 106/D Place of Service:  SNF (31)   CODE STATUS: DNR  Allergies  Allergen Reactions  . Other     Band Aids   . Tape   . Lipitor [Atorvastatin Calcium] Rash and Other (See Comments)    Muscle pain  . Niaspan [Niacin Er] Rash and Other (See Comments)    Muscle pain    Chief Complaint  Patient presents with  . Acute Visit    Care Plan Meeting    HPI:  We have come together for her care plan meeting. Family present. BIMS 7/15 mood 3/30. Weight is stable no falls. Supervision to limited assist with adls is occasionally incontinent of bladder and bowel. She does have pain after she sits up for a prolonged period of time. There are no reports of agitation. Her family is concerned that she has oral thrush. She continues to be followed for her chronic illnesses including: Vascular dementia without behavioral disturbance  Atrial fibrillation with RVR chronic obstructive pulmonary disease with acute exacerbation   Past Medical History:  Diagnosis Date  . Anemia   . Atrial fibrillation ([place])   . Back pain   . Bacterial pneumonia   . Cancer (HCC)    Skin   . Chronic respiratory failure ([place])   . Cognitive communication deficit   . COPD (chronic obstructive pulmonary disease) ([place])   . Dementia ([place])   . Depression   . Diabetes mellitus without complication ([place])   . Diverticulosis   . Fatty liver   . Fibromyalgia   . Gastric polyp 09/08/11   at the anastomosis-inflammatory  . Generalized anxiety disorder   . GERD (gastroesophageal reflux disease)   . 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 Hypercholesteremia   . Hypertension   . Insomnia   . Internal hemorrhoids   . Muscle weakness   . Neuropathy   . On home O2    qhs  . Osteoporosis   . Rheumatoid arteritis ([place])   . Ulcer    stomach  . Vitamin D deficiency     Past Surgical History:  Procedure Laterality Date  . ABDOMINAL HYSTERECTOMY    .  ABDOMINAL SURGERY     removed patial stomach from ulcer  . COLONOSCOPY  04-2001   internal hemorrhoids, panocolonic diverticulosis, and colon polyps   . UPPER GASTROINTESTINAL ENDOSCOPY      Social History   Socioeconomic History  . Marital status: Widowed    Spouse name: Not on file  . Number of children: 6  . Years of education: Not on file  . Highest education level: Not on file  Occupational History  . Occupation: Retired  Tobacco Use  . Smoking status: Never Smoker  . Smokeless tobacco: Never Used  Vaping Use  . Vaping Use: Never used  Substance and Sexual Activity  . Alcohol use: No  . Drug use: No  . Sexual activity: Not Currently  Other Topics Concern  . Not on file  Social History Narrative   Long term resident of [facility]    Social Determinants of Health   Financial Resource Strain:   . Difficulty of Paying Living Expenses:   Food Insecurity:   . Worried About Charity fundraiser in the Last Year:   . Arboriculturist in the Last Year:   Transportation Needs:   . Film/video editor (Medical):   Marland Kitchen Lack of Transportation (Non-Medical):   Physical Activity:   .  Days of Exercise per Week:   . Minutes of Exercise per Session:   Stress:   . Feeling of Stress :   Social Connections: Unknown  . Frequency of Communication with Friends and Family: Never  . Frequency of Social Gatherings with Friends and Family: Never  . Attends Religious Services: Not on file  . Active Member of Clubs or Organizations: Not on file  . Attends Archivist Meetings: Not on file  . Marital Status: Not on file  Intimate Partner Violence:   . Fear of Current or Ex-Partner:   . Emotionally Abused:   Marland Kitchen Physically Abused:   . Sexually Abused:    Family History  Problem Relation Age of Onset  . Heart disease Father   . Kidney disease Mother        kidney cancer  . Colon cancer Daughter 61      VITAL SIGNS BP 134/74   Pulse 78   Temp (!) 97.3 F (36.3 C) (Oral)    Ht 5\' 3"  (1.6 m)   Wt 212 lb (96.2 kg)   SpO2 93%   BMI 37.55 kg/m   Outpatient Encounter Medications as of 10/17/2019  Medication Sig  . acetaminophen (TYLENOL) 650 MG CR tablet Take 650 mg by mouth every 6 (six) hours.  Marland Kitchen apixaban (ELIQUIS) 2.5 MG TABS tablet Take 2.5 mg by mouth 2 (two) times daily.  Marland Kitchen CARTIA XT 300 MG 24 hr capsule TAKE ONE CAPSULE BY MOUTH EVERY DAY  . denosumab (PROLIA) 60 MG/ML SOSY injection Inject 60 mg into the skin every 6 (six) months.  . donepezil (ARICEPT) 5 MG tablet Take 5 mg by mouth at bedtime.  . DULoxetine (CYMBALTA) 20 MG capsule Take 20 mg by mouth daily.  . Fluticasone-Umeclidin-Vilant (TRELEGY ELLIPTA) 100-62.5-25 MCG/INH AEPB One puff inhalation once a day   . Folic Acid 5 MG CAPS Take 5 mg by mouth every Sunday.  . furosemide (LASIX) 40 MG tablet Take 40 mg by mouth daily.  Marland Kitchen guaiFENesin (MUCINEX) 600 MG 12 hr tablet Take 600 mg by mouth 2 (two) times daily.  . Menthol, Topical Analgesic, (BIOFREEZE) 4 % GEL Apply to both knees  daily as needed  . Menthol, Topical Analgesic, (BIOFREEZE) 4 % GEL Apply to bilateral  shoulders Once A Day PRN  . methotrexate (RHEUMATREX) 2.5 MG tablet Take 12.5 mg by mouth once a week. On Sunday - Caution:Chemotherapy. Protect from light.  . NON FORMULARY Diet Type:  NAS, Regular  Consistent Carbohydrates, Dysphagia 3  . OXYGEN Inhale 3.5 L/min into the lungs continuous.  . pantoprazole (PROTONIX) 40 MG tablet Take 40 mg by mouth 2 (two) times daily.  . phenylephrine-shark liver oil-mineral oil-petrolatum (PREPARATION H) 0.25-3-14-71.9 % rectal ointment Place 1 application rectally 2 (two) times daily as needed for hemorrhoids.  . polyethylene glycol (MIRALAX / GLYCOLAX) packet Take 17 g by mouth daily as needed for moderate constipation.  . potassium chloride SA (K-DUR,KLOR-CON) 20 MEQ tablet Take 2 tablets (40 mg ) by mouth twice a day  . pregabalin (LYRICA) 50 MG capsule Take 1 capsule by mouth  Twice daily  .  rosuvastatin (CRESTOR) 20 MG tablet Take 20 mg by mouth daily.  . traMADol (ULTRAM) 50 MG tablet Take 1 tablet (50 mg total) by mouth 2 (two) times daily.   No facility-administered encounter medications on file as of 10/17/2019.     SIGNIFICANT DIAGNOSTIC EXAMS   PREVIOUS;   01-07-19: chest x-ray; COPD. Aortic atherosclerosis no acute  pathology    01-20-19: DEXA: t score -4.997  NO NEW EXAMS.    LABS REVIEWED PREVIOUS :   11-02-18: glucose 117; bun 16; creat 1.02; k+ 4.2; na++ 142; ca 9.6 ;liver normal albumin 3.5; hgb a1c 5.8; urine micro-albumin 4.3   03-20-19: wbc 4.8; hgb 13.9; hct 44.9; mcv 95.7 plt 97; glucose 168; bun 21; creat 1.00 ;k+ 3.9; na++ 138; ca 9.5 liver normal albumin 3.2  03-27-19: wbc 6.1; hgb 15.1; hct 48.1; mcv 94.9 plt 121  05-17-19: wbc 5.9; hgb 14.1; hct 45.8; mcv 92. 5 plt 112; glucose 134; bun 18; creat 0.94; k+ 3.9; na++ 140; ca 9.9 liver normal albumin 3.3; chol 130 ldl 68; trig 102; hdl 42; hgb a1c 6.4  08-15-19: urine micro-albumin 5.0   NO NEW LABS.   Review of Systems  Constitutional: Negative for malaise/fatigue.  HENT:       Mouth has thrush   Respiratory: Negative for cough and shortness of breath.   Cardiovascular: Negative for chest pain, palpitations and leg swelling.  Gastrointestinal: Negative for abdominal pain, constipation and heartburn.  Musculoskeletal: Negative for back pain, joint pain and myalgias.  Skin: Negative.   Neurological: Negative for dizziness.  Psychiatric/Behavioral: The patient is not nervous/anxious.     Physical Exam Constitutional:      General: She is not in acute distress.    Appearance: She is well-developed. She is not diaphoretic.  Neck:     Thyroid: No thyromegaly.  Cardiovascular:     Rate and Rhythm: Normal rate and regular rhythm.     Pulses: Normal pulses.     Heart sounds: Normal heart sounds.  Pulmonary:     Effort: Pulmonary effort is normal. No respiratory distress.     Breath sounds: Normal  breath sounds.     Comments: Uses 02 Abdominal:     General: Bowel sounds are normal. There is no distension.     Palpations: Abdomen is soft.     Tenderness: There is no abdominal tenderness.  Musculoskeletal:     Cervical back: Neck supple.     Right lower leg: No edema.     Left lower leg: No edema.     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  Skin:    General: Skin is warm and dry.  Neurological:     Mental Status: She is alert. Mental status is at baseline.  Psychiatric:        Mood and Affect: Mood normal.       ASSESSMENT/ PLAN:  TODAY  1. Vascular dementia without behavioral disturbance  2. Atrial fibrillation with RVR 3. chronic obstructive pulmonary disease with acute exacerbation  Will begin nystatin 5 cc four times daily through 10-24-19 Will continue current plan of care Will continue to monitor her status.     MD is aware of resident's narcotic use and is in agreement with current plan of care. We will attempt to wean resident as appropriate.  Ok Edwards NP [facility]  Contact [phone] Monday through Friday 8am- 5pm  After hours call [phone]

## 2019-10-18 ENCOUNTER — Non-Acute Institutional Stay (SKILLED_NURSING_FACILITY): Payer: Medicare Other | Admitting: Adult Health

## 2019-10-18 ENCOUNTER — Other Ambulatory Visit: Payer: Self-pay | Admitting: Adult Health

## 2019-10-18 ENCOUNTER — Encounter: Payer: Self-pay | Admitting: Adult Health

## 2019-10-18 DIAGNOSIS — G8929 Other chronic pain: Secondary | ICD-10-CM | POA: Diagnosis not present

## 2019-10-18 DIAGNOSIS — M25561 Pain in right knee: Secondary | ICD-10-CM

## 2019-10-18 DIAGNOSIS — M25562 Pain in left knee: Secondary | ICD-10-CM | POA: Diagnosis not present

## 2019-10-18 NOTE — Progress Notes (Signed)
Location:    [place] Room Number: 106/D Place of Service:  SNF (31)   CODE STATUS: DNR  Allergies  Allergen Reactions   Other     Band Aids    Tape    Lipitor [Atorvastatin Calcium] Rash and Other (See Comments)    Muscle pain   Niaspan [Niacin Er] Rash and Other (See Comments)    Muscle pain    Chief Complaint  Patient presents with   Acute Visit    Bilateral Knee Pain    HPI:  She has been having worsening bilateral knee pain. She is taking tylenol 650 mg four times daily and ultram 50 mg twice daily. She is not getting adequate pain relief. She did tell me that her knees hurt all the time and are "sore". She does get out of bed daily.   Past Medical History:  Diagnosis Date   Anemia    Atrial fibrillation (HCC)    Back pain    Bacterial pneumonia    Cancer ([place])    Skin    Chronic respiratory failure ([place])    Cognitive communication deficit    COPD (chronic obstructive pulmonary disease) (HCC)    Dementia ([place])    Depression    Diabetes mellitus without complication ([place])    Diverticulosis    Fatty liver    Fibromyalgia    Gastric polyp 09/08/11   at the anastomosis-inflammatory   Generalized anxiety disorder    GERD (gastroesophageal reflux disease)    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)    Hypercholesteremia    Hypertension    Insomnia    Internal hemorrhoids    Muscle weakness    Neuropathy    On home O2    qhs   Osteoporosis    Rheumatoid arteritis ([place])    Ulcer    stomach   Vitamin D deficiency     Past Surgical History:  Procedure Laterality Date   ABDOMINAL HYSTERECTOMY     ABDOMINAL SURGERY     removed patial stomach from ulcer   COLONOSCOPY  04-2001   internal hemorrhoids, panocolonic diverticulosis, and colon polyps    UPPER GASTROINTESTINAL ENDOSCOPY      Social History   Socioeconomic History   Marital status: Widowed    Spouse name: Not on file   Number  of children: 6   Years of education: Not on file   Highest education level: Not on file  Occupational History   Occupation: Retired  Tobacco Use   Smoking status: Never Smoker   Smokeless tobacco: Never Used  Scientific laboratory technician Use: Never used  Substance and Sexual Activity   Alcohol use: No   Drug use: No   Sexual activity: Not Currently  Other Topics Concern   Not on file  Social History Narrative   [facility] term resident of [facility]    Social Determinants of Health   Financial Resource Strain:    Difficulty of Paying Living Expenses:   Food Insecurity:    Worried About Charity fundraiser in the Last Year:    Arboriculturist in the Last Year:   Transportation Needs:    Film/video editor (Medical):    Lack of Transportation (Non-Medical):   Physical Activity:    Days of Exercise per Week:    Minutes of Exercise per Session:   Stress:    Feeling of Stress :   Social Connections: Unknown   Frequency of Communication with Friends and  Family: Never   Frequency of Social Gatherings with Friends and Family: Never   Attends Religious Services: Not on Electrical engineer or Organizations: Not on file   Attends Archivist Meetings: Not on file   Marital Status: Not on file  Intimate Partner Violence:    Fear of Current or Ex-Partner:    Emotionally Abused:    Physically Abused:    Sexually Abused:    Family History  Problem Relation Age of Onset   Heart disease Father    Kidney disease Mother        kidney cancer   Colon cancer Daughter 8      VITAL SIGNS BP 134/74    Pulse 78    Temp 98.2 F (36.8 C) (Oral)    Ht 5\' 3"  (1.6 m)    Wt 212 lb (96.2 kg)    SpO2 96%    BMI 37.55 kg/m   Outpatient Encounter Medications as of 10/18/2019  Medication Sig   acetaminophen (TYLENOL) 650 MG CR tablet Take 650 mg by mouth every 6 (six) hours.   apixaban (ELIQUIS) 2.5 MG TABS tablet Take 2.5 mg by mouth 2 (two) times daily.     CARTIA XT 300 MG 24 hr capsule TAKE ONE CAPSULE BY MOUTH EVERY DAY   denosumab (PROLIA) 60 MG/ML SOSY injection Inject 60 mg into the skin every 6 (six) months.   donepezil (ARICEPT) 5 MG tablet Take 5 mg by mouth at bedtime.   DULoxetine (CYMBALTA) 20 MG capsule Take 40 mg by mouth daily.    Fluticasone-Umeclidin-Vilant (TRELEGY ELLIPTA) 100-62.5-25 MCG/INH AEPB One puff inhalation once a day    Folic Acid 5 MG CAPS Take 5 mg by mouth every Sunday.   furosemide (LASIX) 40 MG tablet Take 40 mg by mouth daily.   guaiFENesin (MUCINEX) 600 MG 12 hr tablet Take 600 mg by mouth 2 (two) times daily.   Lidocaine 4 % PTCH Apply topically 2 (two) times daily. adhesive patch,medicated; 4 %; amt: 1/2 patch; topical Special Instructions: place 1/2 patch on bilateral knees in the AM and remove at HS. for bilateral knee pain   Menthol, Topical Analgesic, (BIOFREEZE) 4 % GEL Apply to both knees  daily as needed   Menthol, Topical Analgesic, (BIOFREEZE) 4 % GEL Apply to bilateral  shoulders Once A Day PRN   methotrexate (RHEUMATREX) 2.5 MG tablet Take 12.5 mg by mouth once a week. On Sunday - Caution:Chemotherapy. Protect from light.   NON FORMULARY Diet Type:  NAS, Regular  Consistent Carbohydrates, Dysphagia 3   nystatin (MYCOSTATIN) 100000 UNIT/ML suspension Special Instructions: 5 cc four times daily swish for 30 seconds then swallow for oral thrush   OXYGEN Inhale 3.5 L/min into the lungs continuous.   pantoprazole (PROTONIX) 40 MG tablet Take 40 mg by mouth 2 (two) times daily.   phenylephrine-shark liver oil-mineral oil-petrolatum (PREPARATION H) 0.25-3-14-71.9 % rectal ointment Place 1 application rectally 2 (two) times daily as needed for hemorrhoids.   polyethylene glycol (MIRALAX / GLYCOLAX) packet Take 17 g by mouth daily as needed for moderate constipation.   potassium chloride SA (K-DUR,KLOR-CON) 20 MEQ tablet Take 2 tablets (40 mg ) by mouth twice a day   pregabalin (LYRICA)  50 MG capsule Take 1 capsule by mouth  Twice daily   rosuvastatin (CRESTOR) 20 MG tablet Take 20 mg by mouth daily.   traMADol (ULTRAM) 50 MG tablet Take 1 tablet (50 mg total) by mouth 2 (two)  times daily.   No facility-administered encounter medications on file as of 10/18/2019.     SIGNIFICANT DIAGNOSTIC EXAMS  PREVIOUS;   01-07-19: chest x-ray; COPD. Aortic atherosclerosis no acute pathology    01-20-19: DEXA: t score -4.997  NO NEW EXAMS.    LABS REVIEWED PREVIOUS :   11-02-18: glucose 117; bun 16; creat 1.02; k+ 4.2; na++ 142; ca 9.6 ;liver normal albumin 3.5; hgb a1c 5.8; urine micro-albumin 4.3   03-20-19: wbc 4.8; hgb 13.9; hct 44.9; mcv 95.7 plt 97; glucose 168; bun 21; creat 1.00 ;k+ 3.9; na++ 138; ca 9.5 liver normal albumin 3.2  03-27-19: wbc 6.1; hgb 15.1; hct 48.1; mcv 94.9 plt 121  05-17-19: wbc 5.9; hgb 14.1; hct 45.8; mcv 92. 5 plt 112; glucose 134; bun 18; creat 0.94; k+ 3.9; na++ 140; ca 9.9 liver normal albumin 3.3; chol 130 ldl 68; trig 102; hdl 42; hgb a1c 6.4  08-15-19: urine micro-albumin 5.0   NO NEW LABS.   Review of Systems  Constitutional: Negative for malaise/fatigue.  Respiratory: Negative for cough and shortness of breath.   Cardiovascular: Negative for chest pain, palpitations and leg swelling.  Gastrointestinal: Negative for abdominal pain, constipation and heartburn.  Musculoskeletal: Positive for joint pain. Negative for back pain and myalgias.  Skin: Negative.   Neurological: Negative for dizziness.  Psychiatric/Behavioral: The patient is not nervous/anxious.     Physical Exam Constitutional:      General: She is not in acute distress.    Appearance: She is well-developed. She is not diaphoretic.  Neck:     Thyroid: No thyromegaly.  Cardiovascular:     Rate and Rhythm: Normal rate and regular rhythm.     Pulses: Normal pulses.     Heart sounds: Normal heart sounds.  Pulmonary:     Effort: Pulmonary effort is normal. No respiratory  distress.     Breath sounds: Normal breath sounds.     Comments: Uses 02  Abdominal:     General: Bowel sounds are normal. There is no distension.     Palpations: Abdomen is soft.     Tenderness: There is no abdominal tenderness.  Musculoskeletal:     Cervical back: Neck supple.     Right lower leg: No edema.     Left lower leg: No edema.     Comments: Unable to move left upper extremity due to frozen left shoulder Able to move other extremities  Bilateral knees tender to touch with very mild swelling present.            Lymphadenopathy:     Cervical: No cervical adenopathy.  Skin:    General: Skin is warm and dry.  Neurological:     Mental Status: She is alert. Mental status is at baseline.  Psychiatric:        Mood and Affect: Mood normal.        ASSESSMENT/ PLAN:  TODAY  1. Bilateral knee pain Will increase cymbalta to 40 mg daily  Will begin lidoderm 4% patch use 1/2 patch to both knees daily and remove at HS,  Will monitor her status.     MD is aware of resident's narcotic use and is in agreement with current plan of care. We will attempt to wean resident as appropriate.  Ok Edwards NP [facility]  Contact [phone] Monday through Friday 8am- 5pm  After hours call [phone]

## 2019-10-21 ENCOUNTER — Other Ambulatory Visit: Payer: Self-pay | Admitting: Adult Health

## 2019-10-21 MED ORDER — TRAMADOL HCL 50 MG PO TABS: 50.0000 mg | ORAL_TABLET | Freq: Two times a day (BID) | ORAL | 0 refills | Status: DC

## 2019-10-21 MED ORDER — PREGABALIN 50 MG PO CAPS: ORAL_CAPSULE | ORAL | 0 refills | Status: DC

## 2019-10-28 ENCOUNTER — Non-Acute Institutional Stay (SKILLED_NURSING_FACILITY): Payer: Medicare Other | Admitting: Adult Health

## 2019-10-28 ENCOUNTER — Encounter: Payer: Self-pay | Admitting: Adult Health

## 2019-10-28 DIAGNOSIS — E1142 Type 2 diabetes mellitus with diabetic polyneuropathy: Secondary | ICD-10-CM

## 2019-10-28 DIAGNOSIS — E785 Hyperlipidemia, unspecified: Secondary | ICD-10-CM

## 2019-10-28 DIAGNOSIS — I4891 Unspecified atrial fibrillation: Secondary | ICD-10-CM

## 2019-10-28 DIAGNOSIS — E1169 Type 2 diabetes mellitus with other specified complication: Secondary | ICD-10-CM

## 2019-10-28 NOTE — Progress Notes (Signed)
Location:    [facility] Room Number: 106/D Place of Service:  SNF (31)   CODE STATUS: DNR  Allergies  Allergen Reactions   Other     Band Aids    Tape    Lipitor [Atorvastatin Calcium] Rash and Other (See Comments)    Muscle pain   Niaspan [Niacin Er] Rash and Other (See Comments)    Muscle pain    Chief Complaint  Patient presents with   Medical Management of Chronic Issues         Type 2 diabetes mellitus with peripheral neuropathy    Dyslipidemia associated with type 2 diabetes mellitus    Atrial fibrillation with RVR     HPI:  She is a 86 year old long term resident of this facility being seen for the management of her chronic illnesses: Type 2 diabetes mellitus with peripheral neuropathy  Dyslipidemia associated with type 2 diabetes mellitus   Atrial fibrillation with RVR . There are no reports of uncontrolled pain; no changes in her appetite her weight is stable she has been treated for pneumonia in July without complication. There are no reports of anxiety or agitation.    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)    Hypercholesteremia    Hypertension    Insomnia    Internal hemorrhoids    Muscle weakness    Neuropathy    On home O2    qhs   Osteoporosis    Rheumatoid arteritis ([place])    Ulcer    stomach   Vitamin D deficiency     Past Surgical History:  Procedure  Laterality Date   ABDOMINAL HYSTERECTOMY     ABDOMINAL SURGERY     removed patial stomach from ulcer   COLONOSCOPY  04-2001   internal hemorrhoids, panocolonic diverticulosis, and colon polyps    UPPER GASTROINTESTINAL ENDOSCOPY      Social History   Socioeconomic History   Marital status: Widowed    Spouse name: Not on file   Number of children: 6   Years of education: Not on file   Highest education level: Not on file  Occupational History   Occupation: Retired  Tobacco Use   Smoking status: Never Smoker   Smokeless tobacco: Never Used  Scientific laboratory technician Use: Never used  Substance and Sexual Activity   Alcohol use: No   Drug use: No   Sexual activity: Not Currently  Other Topics Concern   Not on file  Social History Narrative   Long term resident of [facility]    Social Determinants of Health   Financial Resource Strain:    Difficulty of Paying Living Expenses:   Food Insecurity:    Worried About Charity fundraiser in the Last Year:    Arboriculturist in the Last Year:   Transportation Needs:  Lack of Transportation (Medical):    Lack of Transportation (Non-Medical):   Physical Activity:    Days of Exercise per Week:    Minutes of Exercise per Session:   Stress:    Feeling of Stress :   Social Connections: Unknown   Frequency of Communication with Friends and Family: Never   Frequency of Social Gatherings with Friends and Family: Never   Attends Religious Services: Not on Electrical engineer or Organizations: Not on file   Attends Archivist Meetings: Not on file   Marital Status: Not on file  Intimate Partner Violence:    Fear of Current or Ex-Partner:    Emotionally Abused:    Physically Abused:    Sexually Abused:    Family History  Problem Relation Age of Onset   Heart disease Father    Kidney disease Mother        kidney cancer   Colon cancer Daughter 64      VITAL SIGNS BP 132/78     Pulse 68    Temp 98.3 F (36.8 C) (Oral)    Resp 20    Ht 5\' 3"  (1.6 m)    Wt 212 lb (96.2 kg)    SpO2 98%    BMI 37.55 kg/m   Outpatient Encounter Medications as of 10/28/2019  Medication Sig   acetaminophen (TYLENOL) 650 MG CR tablet Take 650 mg by mouth every 6 (six) hours.   apixaban (ELIQUIS) 2.5 MG TABS tablet Take 2.5 mg by mouth 2 (two) times daily.   denosumab (PROLIA) 60 MG/ML SOSY injection Inject 60 mg into the skin every 6 (six) months.   diltiazem (CARDIZEM CD) 300 MG 24 hr capsule Take 300 mg by mouth daily.   donepezil (ARICEPT) 5 MG tablet Take 5 mg by mouth at bedtime.   DULoxetine (CYMBALTA) 20 MG capsule Take 40 mg by mouth daily.    Fluticasone-Umeclidin-Vilant (TRELEGY ELLIPTA) 100-62.5-25 MCG/INH AEPB One puff inhalation once a day    Folic Acid 5 MG CAPS Take 5 mg by mouth every Sunday.   furosemide (LASIX) 40 MG tablet Take 40 mg by mouth daily.   guaiFENesin (MUCINEX) 600 MG 12 hr tablet Take 600 mg by mouth 2 (two) times daily.   Menthol, Topical Analgesic, (BIOFREEZE) 4 % GEL Apply to both knees  daily as needed   Menthol, Topical Analgesic, (BIOFREEZE) 4 % GEL Apply to bilateral  shoulders Once A Day PRN   methotrexate (RHEUMATREX) 2.5 MG tablet Take 12.5 mg by mouth once a week. On Sunday - Caution:Chemotherapy. Protect from light.   NON FORMULARY Diet Type:  NAS, Regular  Consistent Carbohydrates, Dysphagia 3   OXYGEN Inhale 3.5 L/min into the lungs continuous.   pantoprazole (PROTONIX) 40 MG tablet Take 40 mg by mouth 2 (two) times daily.   phenylephrine-shark liver oil-mineral oil-petrolatum (PREPARATION H) 0.25-3-14-71.9 % rectal ointment Place 1 application rectally 2 (two) times daily as needed for hemorrhoids.   polyethylene glycol (MIRALAX / GLYCOLAX) packet Take 17 g by mouth daily as needed for moderate constipation.   potassium chloride SA (K-DUR,KLOR-CON) 20 MEQ tablet Take 2 tablets (40 mg ) by mouth twice a day   pregabalin  (LYRICA) 50 MG capsule Take 1 capsule by mouth  Twice daily   rosuvastatin (CRESTOR) 20 MG tablet Take 20 mg by mouth daily.   traMADol (ULTRAM) 50 MG tablet Take 1 tablet (50 mg total) by mouth 2 (two) times daily.   [  DISCONTINUED] CARTIA XT 300 MG 24 hr capsule TAKE ONE CAPSULE BY MOUTH EVERY DAY   [DISCONTINUED] Lidocaine 4 % PTCH Apply topically 2 (two) times daily. adhesive patch,medicated; 4 %; amt: 1/2 patch; topical Special Instructions: place 1/2 patch on bilateral knees in the AM and remove at HS. for bilateral knee pain   No facility-administered encounter medications on file as of 10/28/2019.     SIGNIFICANT DIAGNOSTIC EXAMS   PREVIOUS;   01-07-19: chest x-ray; COPD. Aortic atherosclerosis no acute pathology    01-20-19: DEXA: t score -4.997  NO NEW EXAMS.    LABS REVIEWED PREVIOUS :   11-02-18: glucose 117; bun 16; creat 1.02; k+ 4.2; na++ 142; ca 9.6 ;liver normal albumin 3.5; hgb a1c 5.8; urine micro-albumin 4.3   03-20-19: wbc 4.8; hgb 13.9; hct 44.9; mcv 95.7 plt 97; glucose 168; bun 21; creat 1.00 ;k+ 3.9; na++ 138; ca 9.5 liver normal albumin 3.2  03-27-19: wbc 6.1; hgb 15.1; hct 48.1; mcv 94.9 plt 121  05-17-19: wbc 5.9; hgb 14.1; hct 45.8; mcv 92. 5 plt 112; glucose 134; bun 18; creat 0.94; k+ 3.9; na++ 140; ca 9.9 liver normal albumin 3.3; chol 130 ldl 68; trig 102; hdl 42; hgb a1c 6.4  08-15-19: urine micro-albumin 5.0   NO NEW LABS.   Review of Systems  Constitutional: Negative for malaise/fatigue.  Respiratory: Negative for cough and shortness of breath.   Cardiovascular: Negative for chest pain, palpitations and leg swelling.  Gastrointestinal: Negative for abdominal pain, constipation and heartburn.  Musculoskeletal: Positive for joint pain. Negative for back pain and myalgias.       Knee pain   Skin: Negative.   Neurological: Negative for dizziness.  Psychiatric/Behavioral: The patient is not nervous/anxious.       Physical Exam Constitutional:       General: She is not in acute distress.    Appearance: She is well-developed. She is not diaphoretic.  Neck:     Thyroid: No thyromegaly.  Cardiovascular:     Rate and Rhythm: Normal rate and regular rhythm.     Heart sounds: Normal heart sounds.  Pulmonary:     Effort: Pulmonary effort is normal. No respiratory distress.     Breath sounds: Normal breath sounds.     Comments: Uses 02 Abdominal:     General: Bowel sounds are normal. There is no distension.     Palpations: Abdomen is soft.     Tenderness: There is no abdominal tenderness.  Musculoskeletal:     Cervical back: Neck supple.     Right lower leg: No edema.     Left lower leg: No edema.     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  Skin:    General: Skin is warm and dry.  Neurological:     Mental Status: She is alert. Mental status is at baseline.  Psychiatric:        Mood and Affect: Mood normal.     ASSESSMENT/ PLAN:  TODAY:   1. Type 2 diabetes mellitus with peripheral neuropathy is stable hgb a1c 6.4 is off tradjenta will monitor is on statin eliquis  2. Dyslipidemia associated with type 2 diabetes mellitus is stable LDL 68 will continue crestor 20 mg daily   3. Atrial fibrillation with RVR heart rate is stable will continue cardizem cd 300 mg daily for rate control and  eliquis 2.5 mg twice daily   PREVIOUS   4. Chronic diastolic heart failure EF 55-60% (07-28-16) will continue lasix 40 mg daily with k+ 40 meq twice daily    5. Hypokalemia is stable k+ 3.9 will continue k+ 40 meq twice daily   6. Unspecified COPD/ chronic respiratory failure with hypoxia: is stable is 02 dependent: will continue trelegy ellipta 100-62,5-25 mcg 1 puff daily   7. GERD without esophagitis: is stable will continue protonix 40 mg twice daily   8. Vascular dementia without behavioral disturbance: is  without change weight is 212 pounds will continue aricept 5 mg daily   9. CKD stage 3 due to type 2 diabetes mellitus: is stable bun 18; creat 0.94 will monitor   10. Diabetic peripheral neuropathy associated with type 2 diabetes mellitus is stable will continue lyrica 50 mg twice daily   11. Fibromyalgia: is stable will continue lyrica 50 mg twice daily cymbalta 40 mg daily   12. Rheumatoid arteritis: is stable will continue methotrexate 55.7 mg weekly folic acid 5 mg weekly   13. Post-menopausal osteoporosis is stable t score -4.997 (01-20-19) will continue prolia 60 mg every 6 months  14. Chronic constipation: is stable will continue miralax daily as needed      MD is aware of resident's narcotic use and is in agreement with current plan of care. We will attempt to wean resident as appropriate.  Ok Edwards NP [facility]  Contact [phone] Monday through Friday 8am- 5pm  After hours call [phone]

## 2019-10-31 DIAGNOSIS — Z1159 Encounter for screening for other viral diseases: Secondary | ICD-10-CM | POA: Diagnosis not present

## 2019-10-31 DIAGNOSIS — I129 Hypertensive chronic kidney disease with stage 1 through stage 4 chronic kidney disease, or unspecified chronic kidney disease: Secondary | ICD-10-CM | POA: Diagnosis not present

## 2019-10-31 DIAGNOSIS — J449 Chronic obstructive pulmonary disease, unspecified: Secondary | ICD-10-CM | POA: Diagnosis not present

## 2019-11-04 DIAGNOSIS — Z1159 Encounter for screening for other viral diseases: Secondary | ICD-10-CM | POA: Diagnosis not present

## 2019-11-04 DIAGNOSIS — I129 Hypertensive chronic kidney disease with stage 1 through stage 4 chronic kidney disease, or unspecified chronic kidney disease: Secondary | ICD-10-CM | POA: Diagnosis not present

## 2019-11-04 DIAGNOSIS — J449 Chronic obstructive pulmonary disease, unspecified: Secondary | ICD-10-CM | POA: Diagnosis not present

## 2019-11-08 ENCOUNTER — Encounter: Payer: Self-pay | Admitting: Adult Health

## 2019-11-08 ENCOUNTER — Non-Acute Institutional Stay: Payer: Self-pay | Admitting: Adult Health

## 2019-11-08 DIAGNOSIS — E1151 Type 2 diabetes mellitus with diabetic peripheral angiopathy without gangrene: Secondary | ICD-10-CM | POA: Diagnosis not present

## 2019-11-08 DIAGNOSIS — Z794 Long term (current) use of insulin: Secondary | ICD-10-CM | POA: Diagnosis not present

## 2019-11-08 DIAGNOSIS — G629 Polyneuropathy, unspecified: Secondary | ICD-10-CM

## 2019-11-08 DIAGNOSIS — M797 Fibromyalgia: Secondary | ICD-10-CM | POA: Diagnosis not present

## 2019-11-08 DIAGNOSIS — L603 Nail dystrophy: Secondary | ICD-10-CM | POA: Diagnosis not present

## 2019-11-08 DIAGNOSIS — E1140 Type 2 diabetes mellitus with diabetic neuropathy, unspecified: Secondary | ICD-10-CM | POA: Diagnosis not present

## 2019-11-08 DIAGNOSIS — M2141 Flat foot [pes planus] (acquired), right foot: Secondary | ICD-10-CM | POA: Diagnosis not present

## 2019-11-08 DIAGNOSIS — B351 Tinea unguium: Secondary | ICD-10-CM | POA: Diagnosis not present

## 2019-11-11 DIAGNOSIS — J449 Chronic obstructive pulmonary disease, unspecified: Secondary | ICD-10-CM | POA: Diagnosis not present

## 2019-11-11 DIAGNOSIS — I129 Hypertensive chronic kidney disease with stage 1 through stage 4 chronic kidney disease, or unspecified chronic kidney disease: Secondary | ICD-10-CM | POA: Diagnosis not present

## 2019-11-11 DIAGNOSIS — Z1159 Encounter for screening for other viral diseases: Secondary | ICD-10-CM | POA: Diagnosis not present

## 2019-11-11 NOTE — Progress Notes (Signed)
Location:   [facility] Room Number: 106D Place of Service:  SNF (31)   CODE STATUS: dnr  Allergies  Allergen Reactions  . Other     Band Aids   . Tape   . Lipitor [Atorvastatin Calcium] Rash and Other (See Comments)    Muscle pain  . Niaspan [Niacin Er] Rash and Other (See Comments)    Muscle pain    Chief Complaint  Patient presents with  . Acute Visit    medication review     HPI:  She is presently taking ultram 50 mg twice daily cymbalta 40 mg daily (this dose started 10-18-19) and lyrica 50 mg twice daily for pain management. There are no reports of uncontrolled pain; no reports of anxiety or agitation. She is doing well at this time on both pain and emotional level.   Past Medical History:  Diagnosis Date  . Anemia   . Atrial fibrillation ([place])   . Back pain   . Bacterial pneumonia   . Cancer (HCC)    Skin   . Chronic respiratory failure ([place])   . Cognitive communication deficit   . COPD (chronic obstructive pulmonary disease) ([place])   . Dementia ([place])   . Depression   . Diabetes mellitus without complication ([place])   . Diverticulosis   . Fatty liver   . Fibromyalgia   . Gastric polyp 09/08/11   at the anastomosis-inflammatory  . Generalized anxiety disorder   . GERD (gastroesophageal reflux disease)   . 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 Hypercholesteremia   . Hypertension   . Insomnia   . Internal hemorrhoids   . Muscle weakness   . Neuropathy   . On home O2    qhs  . Osteoporosis   . Rheumatoid arteritis ([place])   . Ulcer    stomach  . Vitamin D deficiency     Past Surgical History:  Procedure Laterality Date  . ABDOMINAL HYSTERECTOMY    . ABDOMINAL SURGERY     removed patial stomach from ulcer  . COLONOSCOPY  04-2001   internal hemorrhoids, panocolonic diverticulosis, and colon polyps   . UPPER GASTROINTESTINAL ENDOSCOPY      Social History   Socioeconomic History  . Marital status: Widowed    Spouse name: Not on file   . Number of children: 6  . Years of education: Not on file  . Highest education level: Not on file  Occupational History  . Occupation: Retired  Tobacco Use  . Smoking status: Never Smoker  . Smokeless tobacco: Never Used  Vaping Use  . Vaping Use: Never used  Substance and Sexual Activity  . Alcohol use: No  . Drug use: No  . Sexual activity: Not Currently  Other Topics Concern  . Not on file  Social History Narrative   Long term resident of [facility]    Social Determinants of Health   Financial Resource Strain:   . Difficulty of Paying Living Expenses: Not on file  Food Insecurity:   . Worried About Charity fundraiser in the Last Year: Not on file  . Ran Out of Food in the Last Year: Not on file  Transportation Needs:   . Lack of Transportation (Medical): Not on file  . Lack of Transportation (Non-Medical): Not on file  Physical Activity:   . Days of Exercise per Week: Not on file  . Minutes of Exercise per Session: Not on file  Stress:   . Feeling of Stress :  Not on file  Social Connections: Unknown  . Frequency of Communication with Friends and Family: Never  . Frequency of Social Gatherings with Friends and Family: Never  . Attends Religious Services: Not on file  . Active Member of Clubs or Organizations: Not on file  . Attends Archivist Meetings: Not on file  . Marital Status: Not on file  Intimate Partner Violence:   . Fear of Current or Ex-Partner: Not on file  . Emotionally Abused: Not on file  . Physically Abused: Not on file  . Sexually Abused: Not on file   Family History  Problem Relation Age of Onset  . Heart disease Father   . Kidney disease Mother        kidney cancer  . Colon cancer Daughter 19      VITAL SIGNS BP 116/66   Pulse (!) 58   Temp (!) 97.4 F (36.3 C) (Oral)   Resp 20   Ht 5\' 3"  (1.6 m)   Wt 212 lb (96.2 kg)   SpO2 94%   BMI 37.55 kg/m   Outpatient Encounter Medications as of 11/08/2019  Medication Sig  .  acetaminophen (TYLENOL) 650 MG CR tablet Take 650 mg by mouth every 6 (six) hours.  Marland Kitchen apixaban (ELIQUIS) 2.5 MG TABS tablet Take 2.5 mg by mouth 2 (two) times daily.  Marland Kitchen denosumab (PROLIA) 60 MG/ML SOSY injection Inject 60 mg into the skin every 6 (six) months.  . diltiazem (CARDIZEM CD) 300 MG 24 hr capsule Take 300 mg by mouth daily.  Marland Kitchen donepezil (ARICEPT) 5 MG tablet Take 5 mg by mouth at bedtime.  . DULoxetine (CYMBALTA) 20 MG capsule Take 40 mg by mouth daily.   . Fluticasone-Umeclidin-Vilant (TRELEGY ELLIPTA) 100-62.5-25 MCG/INH AEPB One puff inhalation once a day   . Folic Acid 5 MG CAPS Take 5 mg by mouth every Sunday.  . furosemide (LASIX) 40 MG tablet Take 40 mg by mouth daily.  Marland Kitchen guaiFENesin (MUCINEX) 600 MG 12 hr tablet Take 600 mg by mouth 2 (two) times daily.  . Menthol, Topical Analgesic, (BIOFREEZE) 4 % GEL Apply to both knees  daily as needed  . Menthol, Topical Analgesic, (BIOFREEZE) 4 % GEL Apply to bilateral  shoulders Once A Day PRN  . methotrexate (RHEUMATREX) 2.5 MG tablet Take 12.5 mg by mouth once a week. On Sunday - Caution:Chemotherapy. Protect from light.  . NON FORMULARY Diet Type:  NAS, Regular  Consistent Carbohydrates, Dysphagia 3  . OXYGEN Inhale 3.5 L/min into the lungs continuous.  . pantoprazole (PROTONIX) 40 MG tablet Take 40 mg by mouth 2 (two) times daily.  . phenylephrine-shark liver oil-mineral oil-petrolatum (PREPARATION H) 0.25-3-14-71.9 % rectal ointment Place 1 application rectally 2 (two) times daily as needed for hemorrhoids.  . polyethylene glycol (MIRALAX / GLYCOLAX) packet Take 17 g by mouth daily as needed for moderate constipation.  . potassium chloride SA (K-DUR,KLOR-CON) 20 MEQ tablet Take 2 tablets (40 mg ) by mouth twice a day  . pregabalin (LYRICA) 50 MG capsule Take 1 capsule by mouth  Twice daily  . rosuvastatin (CRESTOR) 20 MG tablet Take 20 mg by mouth daily.  . traMADol (ULTRAM) 50 MG tablet Take 1 tablet (50 mg total) by mouth 2 (two)  times daily.   No facility-administered encounter medications on file as of 11/08/2019.     SIGNIFICANT DIAGNOSTIC EXAMS  PREVIOUS;   01-07-19: chest x-ray; COPD. Aortic atherosclerosis no acute pathology    01-20-19: DEXA: t score -  4.997  NO NEW EXAMS.    LABS REVIEWED PREVIOUS :   11-02-18: glucose 117; bun 16; creat 1.02; k+ 4.2; na++ 142; ca 9.6 ;liver normal albumin 3.5; hgb a1c 5.8; urine micro-albumin 4.3   03-20-19: wbc 4.8; hgb 13.9; hct 44.9; mcv 95.7 plt 97; glucose 168; bun 21; creat 1.00 ;k+ 3.9; na++ 138; ca 9.5 liver normal albumin 3.2  03-27-19: wbc 6.1; hgb 15.1; hct 48.1; mcv 94.9 plt 121  05-17-19: wbc 5.9; hgb 14.1; hct 45.8; mcv 92. 5 plt 112; glucose 134; bun 18; creat 0.94; k+ 3.9; na++ 140; ca 9.9 liver normal albumin 3.3; chol 130 ldl 68; trig 102; hdl 42; hgb a1c 6.4  08-15-19: urine micro-albumin 5.0   NO NEW LABS.     Review of Systems  Constitutional: Negative for malaise/fatigue.  Respiratory: Negative for cough and shortness of breath.   Cardiovascular: Negative for chest pain, palpitations and leg swelling.  Gastrointestinal: Negative for abdominal pain, constipation and heartburn.  Musculoskeletal: Negative for back pain, joint pain and myalgias.  Skin: Negative.   Neurological: Negative for dizziness.  Psychiatric/Behavioral: The patient is not nervous/anxious.     Physical Exam Constitutional:      General: She is not in acute distress.    Appearance: She is well-developed. She is not diaphoretic.  Neck:     Thyroid: No thyromegaly.  Cardiovascular:     Rate and Rhythm: Normal rate and regular rhythm.     Pulses: Normal pulses.     Heart sounds: Normal heart sounds.  Pulmonary:     Effort: Pulmonary effort is normal. No respiratory distress.     Breath sounds: Normal breath sounds.     Comments: 02 Abdominal:     General: Bowel sounds are normal. There is no distension.     Palpations: Abdomen is soft.     Tenderness: There is no  abdominal tenderness.  Musculoskeletal:     Cervical back: Neck supple.     Right lower leg: No edema.     Left lower leg: No edema.     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  Skin:    General: Skin is warm and dry.  Neurological:     Mental Status: She is alert. Mental status is at baseline.  Psychiatric:        Mood and Affect: Mood normal.        ASSESSMENT/ PLAN:  TODAY  1. Peripheral polyneuropathy 2. Fibromyalgia  Will continue ultram 50 mg twice daily  cymbalta 40 mg daily  lyrica 50 mg twice daily  Will continue to monitor her status.   MD is aware of resident's narcotic use and is in agreement with current plan of care. We will attempt to wean resident as appropriate.  Ok Edwards NP [facility]  Contact [phone] Monday through Friday 8am- 5pm  After hours call [phone]

## 2019-11-12 ENCOUNTER — Other Ambulatory Visit: Payer: Self-pay | Admitting: Adult Health

## 2019-11-12 MED ORDER — PREGABALIN 50 MG PO CAPS: ORAL_CAPSULE | ORAL | 0 refills | Status: DC

## 2019-11-12 MED ORDER — TRAMADOL HCL 50 MG PO TABS: 50.0000 mg | ORAL_TABLET | Freq: Two times a day (BID) | ORAL | 0 refills | Status: DC

## 2019-11-14 DIAGNOSIS — I129 Hypertensive chronic kidney disease with stage 1 through stage 4 chronic kidney disease, or unspecified chronic kidney disease: Secondary | ICD-10-CM | POA: Diagnosis not present

## 2019-11-14 DIAGNOSIS — J449 Chronic obstructive pulmonary disease, unspecified: Secondary | ICD-10-CM | POA: Diagnosis not present

## 2019-11-14 DIAGNOSIS — Z1159 Encounter for screening for other viral diseases: Secondary | ICD-10-CM | POA: Diagnosis not present

## 2019-11-18 DIAGNOSIS — Z1159 Encounter for screening for other viral diseases: Secondary | ICD-10-CM | POA: Diagnosis not present

## 2019-11-18 DIAGNOSIS — J449 Chronic obstructive pulmonary disease, unspecified: Secondary | ICD-10-CM | POA: Diagnosis not present

## 2019-11-18 DIAGNOSIS — I129 Hypertensive chronic kidney disease with stage 1 through stage 4 chronic kidney disease, or unspecified chronic kidney disease: Secondary | ICD-10-CM | POA: Diagnosis not present

## 2019-11-21 DIAGNOSIS — Z1159 Encounter for screening for other viral diseases: Secondary | ICD-10-CM | POA: Diagnosis not present

## 2019-11-21 DIAGNOSIS — I129 Hypertensive chronic kidney disease with stage 1 through stage 4 chronic kidney disease, or unspecified chronic kidney disease: Secondary | ICD-10-CM | POA: Diagnosis not present

## 2019-11-21 DIAGNOSIS — J449 Chronic obstructive pulmonary disease, unspecified: Secondary | ICD-10-CM | POA: Diagnosis not present

## 2019-11-25 DIAGNOSIS — J449 Chronic obstructive pulmonary disease, unspecified: Secondary | ICD-10-CM | POA: Diagnosis not present

## 2019-11-25 DIAGNOSIS — Z1159 Encounter for screening for other viral diseases: Secondary | ICD-10-CM | POA: Diagnosis not present

## 2019-11-25 DIAGNOSIS — I129 Hypertensive chronic kidney disease with stage 1 through stage 4 chronic kidney disease, or unspecified chronic kidney disease: Secondary | ICD-10-CM | POA: Diagnosis not present

## 2019-11-27 DIAGNOSIS — R262 Difficulty in walking, not elsewhere classified: Secondary | ICD-10-CM | POA: Diagnosis not present

## 2019-11-27 DIAGNOSIS — M810 Age-related osteoporosis without current pathological fracture: Secondary | ICD-10-CM | POA: Diagnosis not present

## 2019-11-27 DIAGNOSIS — J449 Chronic obstructive pulmonary disease, unspecified: Secondary | ICD-10-CM | POA: Diagnosis not present

## 2019-11-27 DIAGNOSIS — M6281 Muscle weakness (generalized): Secondary | ICD-10-CM | POA: Diagnosis not present

## 2019-11-28 DIAGNOSIS — J449 Chronic obstructive pulmonary disease, unspecified: Secondary | ICD-10-CM | POA: Diagnosis not present

## 2019-11-28 DIAGNOSIS — I129 Hypertensive chronic kidney disease with stage 1 through stage 4 chronic kidney disease, or unspecified chronic kidney disease: Secondary | ICD-10-CM | POA: Diagnosis not present

## 2019-11-28 DIAGNOSIS — R262 Difficulty in walking, not elsewhere classified: Secondary | ICD-10-CM | POA: Diagnosis not present

## 2019-11-28 DIAGNOSIS — M6281 Muscle weakness (generalized): Secondary | ICD-10-CM | POA: Diagnosis not present

## 2019-11-28 DIAGNOSIS — Z1159 Encounter for screening for other viral diseases: Secondary | ICD-10-CM | POA: Diagnosis not present

## 2019-11-28 DIAGNOSIS — M810 Age-related osteoporosis without current pathological fracture: Secondary | ICD-10-CM | POA: Diagnosis not present

## 2019-11-29 DIAGNOSIS — J449 Chronic obstructive pulmonary disease, unspecified: Secondary | ICD-10-CM | POA: Diagnosis not present

## 2019-11-29 DIAGNOSIS — M810 Age-related osteoporosis without current pathological fracture: Secondary | ICD-10-CM | POA: Diagnosis not present

## 2019-11-29 DIAGNOSIS — R262 Difficulty in walking, not elsewhere classified: Secondary | ICD-10-CM | POA: Diagnosis not present

## 2019-11-29 DIAGNOSIS — M6281 Muscle weakness (generalized): Secondary | ICD-10-CM | POA: Diagnosis not present

## 2019-12-02 DIAGNOSIS — M810 Age-related osteoporosis without current pathological fracture: Secondary | ICD-10-CM | POA: Diagnosis not present

## 2019-12-02 DIAGNOSIS — R262 Difficulty in walking, not elsewhere classified: Secondary | ICD-10-CM | POA: Diagnosis not present

## 2019-12-02 DIAGNOSIS — J449 Chronic obstructive pulmonary disease, unspecified: Secondary | ICD-10-CM | POA: Diagnosis not present

## 2019-12-02 DIAGNOSIS — Z1159 Encounter for screening for other viral diseases: Secondary | ICD-10-CM | POA: Diagnosis not present

## 2019-12-02 DIAGNOSIS — M6281 Muscle weakness (generalized): Secondary | ICD-10-CM | POA: Diagnosis not present

## 2019-12-02 DIAGNOSIS — I129 Hypertensive chronic kidney disease with stage 1 through stage 4 chronic kidney disease, or unspecified chronic kidney disease: Secondary | ICD-10-CM | POA: Diagnosis not present

## 2019-12-03 ENCOUNTER — Encounter: Payer: Self-pay | Admitting: Adult Health

## 2019-12-03 ENCOUNTER — Non-Acute Institutional Stay (SKILLED_NURSING_FACILITY): Payer: Medicare Other | Admitting: Adult Health

## 2019-12-03 DIAGNOSIS — I5032 Chronic diastolic (congestive) heart failure: Secondary | ICD-10-CM | POA: Diagnosis not present

## 2019-12-03 DIAGNOSIS — J441 Chronic obstructive pulmonary disease with (acute) exacerbation: Secondary | ICD-10-CM

## 2019-12-03 DIAGNOSIS — R262 Difficulty in walking, not elsewhere classified: Secondary | ICD-10-CM | POA: Diagnosis not present

## 2019-12-03 DIAGNOSIS — J9611 Chronic respiratory failure with hypoxia: Secondary | ICD-10-CM

## 2019-12-03 DIAGNOSIS — J449 Chronic obstructive pulmonary disease, unspecified: Secondary | ICD-10-CM | POA: Diagnosis not present

## 2019-12-03 DIAGNOSIS — E876 Hypokalemia: Secondary | ICD-10-CM

## 2019-12-03 DIAGNOSIS — M6281 Muscle weakness (generalized): Secondary | ICD-10-CM | POA: Diagnosis not present

## 2019-12-03 DIAGNOSIS — M810 Age-related osteoporosis without current pathological fracture: Secondary | ICD-10-CM | POA: Diagnosis not present

## 2019-12-03 NOTE — Progress Notes (Signed)
Location:    [facility] Room Number: 106/D Place of Service:  SNF (31)   CODE STATUS: DNR  Allergies  Allergen Reactions  . Other     Band Aids   . Tape   . Lipitor [Atorvastatin Calcium] Rash and Other (See Comments)    Muscle pain  . Niaspan [Niacin Er] Rash and Other (See Comments)    Muscle pain    Chief Complaint  Patient presents with  . Medical Management of Chronic Issues              Chronic diastolic heart failure     Hypokalemia:    Unspecified COPD/ chronic respiratory failure with hypoxia    HPI:  She is a 86 year old long term resident of this facility being seen for the management of her chronic illnesses:  Chronic diastolic heart failure     Hypokalemia:    Unspecified COPD/ chronic respiratory failure with hypoxia. There are no reports of cough; shortness of breath. No reports of uncontrolled pain.   Past Medical History:  Diagnosis Date  . Anemia   . Atrial fibrillation ([place])   . Back pain   . Bacterial pneumonia   . Cancer (HCC)    Skin   . Chronic respiratory failure ([place])   . Cognitive communication deficit   . COPD (chronic obstructive pulmonary disease) ([place])   . Dementia ([place])   . Depression   . Diabetes mellitus without complication ([place])   . Diverticulosis   . Fatty liver   . Fibromyalgia   . Gastric polyp 09/08/11   at the anastomosis-inflammatory  . Generalized anxiety disorder   . GERD (gastroesophageal reflux disease)   . 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 Hypercholesteremia   . Hypertension   . Insomnia   . Internal hemorrhoids   . Muscle weakness   . Neuropathy   . On home O2    qhs  . Osteoporosis   . Rheumatoid arteritis ([place])   . Ulcer    stomach  . Vitamin D deficiency     Past Surgical History:  Procedure Laterality Date  . ABDOMINAL HYSTERECTOMY    . ABDOMINAL SURGERY     removed patial stomach from ulcer  . COLONOSCOPY  04-2001   internal hemorrhoids, panocolonic  diverticulosis, and colon polyps   . UPPER GASTROINTESTINAL ENDOSCOPY      Social History   Socioeconomic History  . Marital status: Widowed    Spouse name: Not on file  . Number of children: 6  . Years of education: Not on file  . Highest education level: Not on file  Occupational History  . Occupation: Retired  Tobacco Use  . Smoking status: Never Smoker  . Smokeless tobacco: Never Used  Vaping Use  . Vaping Use: Never used  Substance and Sexual Activity  . Alcohol use: No  . Drug use: No  . Sexual activity: Not Currently  Other Topics Concern  . Not on file  Social History Narrative   Long term resident of [facility]    Social Determinants of Health   Financial Resource Strain:   . Difficulty of Paying Living Expenses: Not on file  Food Insecurity:   . Worried About Charity fundraiser in the Last Year: Not on file  . Ran Out of Food in the Last Year: Not on file  Transportation Needs:   . Lack of Transportation (Medical): Not on file  . Lack of Transportation (Non-Medical): Not on  file  Physical Activity:   . Days of Exercise per Week: Not on file  . Minutes of Exercise per Session: Not on file  Stress:   . Feeling of Stress : Not on file  Social Connections: Unknown  . Frequency of Communication with Friends and Family: Never  . Frequency of Social Gatherings with Friends and Family: Never  . Attends Religious Services: Not on file  . Active Member of Clubs or Organizations: Not on file  . Attends Archivist Meetings: Not on file  . Marital Status: Not on file  Intimate Partner Violence:   . Fear of Current or Ex-Partner: Not on file  . Emotionally Abused: Not on file  . Physically Abused: Not on file  . Sexually Abused: Not on file   Family History  Problem Relation Age of Onset  . Heart disease Father   . Kidney disease Mother        kidney cancer  . Colon cancer Daughter 32      VITAL SIGNS BP (!) 144/85   Pulse 84   Temp 98 F (36.7 C)    Ht 5\' 3"  (1.6 m)   Wt 209 lb (94.8 kg)   SpO2 96%   BMI 37.02 kg/m   Outpatient Encounter Medications as of 12/03/2019  Medication Sig  . acetaminophen (TYLENOL) 650 MG CR tablet Take 650 mg by mouth every 6 (six) hours.  Marland Kitchen apixaban (ELIQUIS) 2.5 MG TABS tablet Take 2.5 mg by mouth 2 (two) times daily.  Marland Kitchen denosumab (PROLIA) 60 MG/ML SOSY injection Inject 60 mg into the skin every 6 (six) months.  . diltiazem (CARDIZEM CD) 300 MG 24 hr capsule Take 300 mg by mouth daily.  Marland Kitchen donepezil (ARICEPT) 5 MG tablet Take 5 mg by mouth at bedtime.  . DULoxetine (CYMBALTA) 20 MG capsule Take 40 mg by mouth daily.   . Fluticasone-Umeclidin-Vilant (TRELEGY ELLIPTA) 100-62.5-25 MCG/INH AEPB One puff inhalation once a day   . Folic Acid 5 MG CAPS Take 5 mg by mouth every Sunday.  . furosemide (LASIX) 40 MG tablet Take 40 mg by mouth daily.  Marland Kitchen guaiFENesin (MUCINEX) 600 MG 12 hr tablet Take 600 mg by mouth 2 (two) times daily.  . Menthol, Topical Analgesic, (BIOFREEZE) 4 % GEL Apply to both knees  daily as needed  . Menthol, Topical Analgesic, (BIOFREEZE) 4 % GEL Apply to bilateral  shoulders Once A Day PRN  . methotrexate (RHEUMATREX) 2.5 MG tablet Take 12.5 mg by mouth once a week. On Sunday - Caution:Chemotherapy. Protect from light.  . NON FORMULARY Diet Type:  NAS, Regular  Consistent Carbohydrates, Dysphagia 3  . OXYGEN Inhale 3.5 L/min into the lungs continuous.  . pantoprazole (PROTONIX) 40 MG tablet Take 40 mg by mouth 2 (two) times daily.  . phenylephrine-shark liver oil-mineral oil-petrolatum (PREPARATION H) 0.25-3-14-71.9 % rectal ointment Place 1 application rectally 2 (two) times daily as needed for hemorrhoids.  . polyethylene glycol (MIRALAX / GLYCOLAX) packet Take 17 g by mouth daily as needed for moderate constipation.  . potassium chloride SA (K-DUR,KLOR-CON) 20 MEQ tablet Take 2 tablets (40 mg ) by mouth twice a day  . pregabalin (LYRICA) 50 MG capsule Take 1 capsule by mouth  Twice  daily  . rosuvastatin (CRESTOR) 20 MG tablet Take 20 mg by mouth daily.  . traMADol (ULTRAM) 50 MG tablet Take 1 tablet (50 mg total) by mouth 2 (two) times daily.   No facility-administered encounter medications on file as of  12/03/2019.     SIGNIFICANT DIAGNOSTIC EXAMS   PREVIOUS;   01-07-19: chest x-ray; COPD. Aortic atherosclerosis no acute pathology    01-20-19: DEXA: t score -4.997  NO NEW EXAMS.    LABS REVIEWED PREVIOUS :   03-20-19: wbc 4.8; hgb 13.9; hct 44.9; mcv 95.7 plt 97; glucose 168; bun 21; creat 1.00 ;k+ 3.9; na++ 138; ca 9.5 liver normal albumin 3.2  03-27-19: wbc 6.1; hgb 15.1; hct 48.1; mcv 94.9 plt 121  05-17-19: wbc 5.9; hgb 14.1; hct 45.8; mcv 92. 5 plt 112; glucose 134; bun 18; creat 0.94; k+ 3.9; na++ 140; ca 9.9 liver normal albumin 3.3; chol 130 ldl 68; trig 102; hdl 42; hgb a1c 6.4  08-15-19: urine micro-albumin 5.0   NO NEW LABS.   Review of Systems  Constitutional: Negative for malaise/fatigue.  Respiratory: Negative for cough and shortness of breath.   Cardiovascular: Negative for chest pain, palpitations and leg swelling.  Gastrointestinal: Negative for abdominal pain, constipation and heartburn.  Musculoskeletal: Negative for back pain, joint pain and myalgias.  Skin: Negative.   Neurological: Negative for dizziness.  Psychiatric/Behavioral: The patient is not nervous/anxious.     Physical Exam Constitutional:      General: She is not in acute distress.    Appearance: She is well-developed. She is not diaphoretic.  Neck:     Thyroid: No thyromegaly.  Cardiovascular:     Rate and Rhythm: Normal rate and regular rhythm.     Pulses: Normal pulses.     Heart sounds: Normal heart sounds.  Pulmonary:     Effort: Pulmonary effort is normal. No respiratory distress.     Breath sounds: Normal breath sounds.     Comments: 02 Abdominal:     General: Bowel sounds are normal. There is no distension.     Palpations: Abdomen is soft.     Tenderness:  There is no abdominal tenderness.  Musculoskeletal:     Cervical back: Neck supple.     Right lower leg: No edema.     Left lower leg: No edema.     Comments: Unable to move left upper extremity due to frozen left shoulder Able to move other extremities     Lymphadenopathy:     Cervical: No cervical adenopathy.  Skin:    General: Skin is warm and dry.  Neurological:     Mental Status: She is alert. Mental status is at baseline.  Psychiatric:        Mood and Affect: Mood normal.      ASSESSMENT/ PLAN:  TODAY:   1. Chronic diastolic heart failure EF 55-60% (07-28-16) will continue lasix 40 mg daily with k+ 40 meq twice daily   2. Hypokalemia: is stable k+ 3.9 will continue k+ 40 meq twice daily   3. Unspecified COPD/ chronic respiratory failure with hypoxia is stable is 02 dependent will continue trelegy ellipta 100-62.5-25 mcg 1 puff daily   PREVIOUS   4. GERD without esophagitis: is stable will continue protonix 40 mg twice daily   5. Vascular dementia without behavioral disturbance: is without change weight is 212 pounds will continue aricept 5 mg daily   6. CKD stage 3 due to type 2 diabetes mellitus: is stable bun 18; creat 0.94 will monitor   7. Diabetic peripheral neuropathy associated with type 2 diabetes mellitus is stable will continue lyrica 50 mg twice daily   8. Fibromyalgia: is stable will continue lyrica 50 mg twice daily cymbalta 40 mg daily  9. Rheumatoid arteritis: is stable will continue methotrexate 57.0 mg weekly folic acid 5 mg weekly   10. Post-menopausal osteoporosis is stable t score -4.997 (01-20-19) will continue prolia 60 mg every 6 months  11. Chronic constipation: is stable will continue miralax daily as needed   12. Type 2 diabetes mellitus with peripheral neuropathy is stable hgb a1c 6.4 is off tradjenta will monitor is on statin eliquis  13. Dyslipidemia associated with type 2 diabetes mellitus is stable LDL 68 will stop crestor due to  her advanced age.   14. Atrial fibrillation with RVR heart rate is stable will continue cardizem cd 300 mg daily for rate control and eliquis 2.5 mg twice daily      MD is aware of resident's narcotic use and is in agreement with current plan of care. We will attempt to wean resident as appropriate.  Ok Edwards NP [facility]  Contact [phone] Monday through Friday 8am- 5pm  After hours call [phone]

## 2019-12-04 DIAGNOSIS — M810 Age-related osteoporosis without current pathological fracture: Secondary | ICD-10-CM | POA: Diagnosis not present

## 2019-12-04 DIAGNOSIS — M6281 Muscle weakness (generalized): Secondary | ICD-10-CM | POA: Diagnosis not present

## 2019-12-04 DIAGNOSIS — J449 Chronic obstructive pulmonary disease, unspecified: Secondary | ICD-10-CM | POA: Diagnosis not present

## 2019-12-04 DIAGNOSIS — R262 Difficulty in walking, not elsewhere classified: Secondary | ICD-10-CM | POA: Diagnosis not present

## 2019-12-05 ENCOUNTER — Other Ambulatory Visit (HOSPITAL_COMMUNITY)
Admission: RE | Admit: 2019-12-05 | Discharge: 2019-12-05 | Disposition: A | Discharge status: Home / Self Care | Payer: Medicare Other | Source: Skilled Nursing Facility | Attending: Adult Health | Admitting: Adult Health

## 2019-12-05 DIAGNOSIS — M6281 Muscle weakness (generalized): Secondary | ICD-10-CM | POA: Diagnosis not present

## 2019-12-05 DIAGNOSIS — E118 Type 2 diabetes mellitus with unspecified complications: Secondary | ICD-10-CM | POA: Diagnosis not present

## 2019-12-05 DIAGNOSIS — R262 Difficulty in walking, not elsewhere classified: Secondary | ICD-10-CM | POA: Diagnosis not present

## 2019-12-05 DIAGNOSIS — J449 Chronic obstructive pulmonary disease, unspecified: Secondary | ICD-10-CM | POA: Diagnosis not present

## 2019-12-05 DIAGNOSIS — M810 Age-related osteoporosis without current pathological fracture: Secondary | ICD-10-CM | POA: Diagnosis not present

## 2019-12-05 LAB — COMPREHENSIVE METABOLIC PANEL
ALT: 16 U/L (ref 0–44)
AST: 24 U/L (ref 15–41)
Albumin: 2.8 g/dL — ABNORMAL LOW (ref 3.5–5.0)
Alkaline Phosphatase: 49 U/L (ref 38–126)
Anion gap: 6 (ref 5–15)
BUN: 13 mg/dL (ref 8–23)
CO2: 28 mmol/L (ref 22–32)
Calcium: 9.2 mg/dL (ref 8.9–10.3)
Chloride: 102 mmol/L (ref 98–111)
Creatinine, Ser: 1.04 mg/dL — ABNORMAL HIGH (ref 0.44–1.00)
GFR calc Af Amer: 55 mL/min — ABNORMAL LOW (ref >60)
GFR calc non Af Amer: 48 mL/min — ABNORMAL LOW (ref >60)
Glucose, Bld: 188 mg/dL — ABNORMAL HIGH (ref 70–99)
Potassium: 3.9 mmol/L (ref 3.5–5.1)
Sodium: 136 mmol/L (ref 135–145)
Total Bilirubin: 0.9 mg/dL (ref 0.3–1.2)
Total Protein: 6.1 g/dL — ABNORMAL LOW (ref 6.5–8.1)

## 2019-12-05 LAB — CBC
HCT: 36.9 % (ref 36.0–46.0)
Hemoglobin: 10.6 g/dL — ABNORMAL LOW (ref 12.0–15.0)
MCH: 25.2 pg — ABNORMAL LOW (ref 26.0–34.0)
MCHC: 28.7 g/dL — ABNORMAL LOW (ref 30.0–36.0)
MCV: 87.9 fL (ref 80.0–100.0)
Platelets: 104 10*3/uL — ABNORMAL LOW (ref 150–400)
RBC: 4.2 MIL/uL (ref 3.87–5.11)
RDW: 17.7 % — ABNORMAL HIGH (ref 11.5–15.5)
WBC: 6.8 10*3/uL (ref 4.0–10.5)
nRBC: 0 % (ref 0.0–0.2)

## 2019-12-05 LAB — HEMOGLOBIN A1C
Hgb A1c MFr Bld: 7 % — ABNORMAL HIGH (ref 4.8–5.6)
Mean Plasma Glucose: 154.2 mg/dL

## 2019-12-06 DIAGNOSIS — M810 Age-related osteoporosis without current pathological fracture: Secondary | ICD-10-CM | POA: Diagnosis not present

## 2019-12-06 DIAGNOSIS — M6281 Muscle weakness (generalized): Secondary | ICD-10-CM | POA: Diagnosis not present

## 2019-12-06 DIAGNOSIS — J449 Chronic obstructive pulmonary disease, unspecified: Secondary | ICD-10-CM | POA: Diagnosis not present

## 2019-12-06 DIAGNOSIS — R262 Difficulty in walking, not elsewhere classified: Secondary | ICD-10-CM | POA: Diagnosis not present

## 2019-12-06 LAB — MICROALBUMIN, URINE: Microalb, Ur: <3 ug/mL — ABNORMAL HIGH

## 2019-12-08 DIAGNOSIS — M6281 Muscle weakness (generalized): Secondary | ICD-10-CM | POA: Diagnosis not present

## 2019-12-08 DIAGNOSIS — J449 Chronic obstructive pulmonary disease, unspecified: Secondary | ICD-10-CM | POA: Diagnosis not present

## 2019-12-08 DIAGNOSIS — R262 Difficulty in walking, not elsewhere classified: Secondary | ICD-10-CM | POA: Diagnosis not present

## 2019-12-08 DIAGNOSIS — M810 Age-related osteoporosis without current pathological fracture: Secondary | ICD-10-CM | POA: Diagnosis not present

## 2019-12-09 DIAGNOSIS — M810 Age-related osteoporosis without current pathological fracture: Secondary | ICD-10-CM | POA: Diagnosis not present

## 2019-12-09 DIAGNOSIS — R262 Difficulty in walking, not elsewhere classified: Secondary | ICD-10-CM | POA: Diagnosis not present

## 2019-12-09 DIAGNOSIS — M6281 Muscle weakness (generalized): Secondary | ICD-10-CM | POA: Diagnosis not present

## 2019-12-09 DIAGNOSIS — J449 Chronic obstructive pulmonary disease, unspecified: Secondary | ICD-10-CM | POA: Diagnosis not present

## 2019-12-10 ENCOUNTER — Other Ambulatory Visit: Payer: Self-pay | Admitting: Adult Health

## 2019-12-10 MED ORDER — TRAMADOL HCL 50 MG PO TABS
50.0000 mg | ORAL_TABLET | Freq: Two times a day (BID) | ORAL | 0 refills | Status: DC
Start: 2019-12-10 — End: 2020-01-01

## 2019-12-10 MED ORDER — PREGABALIN 50 MG PO CAPS: ORAL_CAPSULE | ORAL | 0 refills | Status: DC

## 2019-12-11 DIAGNOSIS — M6281 Muscle weakness (generalized): Secondary | ICD-10-CM | POA: Diagnosis not present

## 2019-12-11 DIAGNOSIS — M810 Age-related osteoporosis without current pathological fracture: Secondary | ICD-10-CM | POA: Diagnosis not present

## 2019-12-11 DIAGNOSIS — J449 Chronic obstructive pulmonary disease, unspecified: Secondary | ICD-10-CM | POA: Diagnosis not present

## 2019-12-11 DIAGNOSIS — R262 Difficulty in walking, not elsewhere classified: Secondary | ICD-10-CM | POA: Diagnosis not present

## 2019-12-12 DIAGNOSIS — M6281 Muscle weakness (generalized): Secondary | ICD-10-CM | POA: Diagnosis not present

## 2019-12-12 DIAGNOSIS — J449 Chronic obstructive pulmonary disease, unspecified: Secondary | ICD-10-CM | POA: Diagnosis not present

## 2019-12-12 DIAGNOSIS — M810 Age-related osteoporosis without current pathological fracture: Secondary | ICD-10-CM | POA: Diagnosis not present

## 2019-12-12 DIAGNOSIS — R262 Difficulty in walking, not elsewhere classified: Secondary | ICD-10-CM | POA: Diagnosis not present

## 2019-12-13 DIAGNOSIS — J449 Chronic obstructive pulmonary disease, unspecified: Secondary | ICD-10-CM | POA: Diagnosis not present

## 2019-12-13 DIAGNOSIS — M6281 Muscle weakness (generalized): Secondary | ICD-10-CM | POA: Diagnosis not present

## 2019-12-13 DIAGNOSIS — R262 Difficulty in walking, not elsewhere classified: Secondary | ICD-10-CM | POA: Diagnosis not present

## 2019-12-13 DIAGNOSIS — M810 Age-related osteoporosis without current pathological fracture: Secondary | ICD-10-CM | POA: Diagnosis not present

## 2019-12-15 DIAGNOSIS — M810 Age-related osteoporosis without current pathological fracture: Secondary | ICD-10-CM | POA: Diagnosis not present

## 2019-12-15 DIAGNOSIS — R262 Difficulty in walking, not elsewhere classified: Secondary | ICD-10-CM | POA: Diagnosis not present

## 2019-12-15 DIAGNOSIS — J449 Chronic obstructive pulmonary disease, unspecified: Secondary | ICD-10-CM | POA: Diagnosis not present

## 2019-12-15 DIAGNOSIS — M6281 Muscle weakness (generalized): Secondary | ICD-10-CM | POA: Diagnosis not present

## 2019-12-16 DIAGNOSIS — J449 Chronic obstructive pulmonary disease, unspecified: Secondary | ICD-10-CM | POA: Diagnosis not present

## 2019-12-16 DIAGNOSIS — M6281 Muscle weakness (generalized): Secondary | ICD-10-CM | POA: Diagnosis not present

## 2019-12-16 DIAGNOSIS — R262 Difficulty in walking, not elsewhere classified: Secondary | ICD-10-CM | POA: Diagnosis not present

## 2019-12-16 DIAGNOSIS — M810 Age-related osteoporosis without current pathological fracture: Secondary | ICD-10-CM | POA: Diagnosis not present

## 2019-12-17 DIAGNOSIS — J449 Chronic obstructive pulmonary disease, unspecified: Secondary | ICD-10-CM | POA: Diagnosis not present

## 2019-12-17 DIAGNOSIS — M810 Age-related osteoporosis without current pathological fracture: Secondary | ICD-10-CM | POA: Diagnosis not present

## 2019-12-17 DIAGNOSIS — R262 Difficulty in walking, not elsewhere classified: Secondary | ICD-10-CM | POA: Diagnosis not present

## 2019-12-17 DIAGNOSIS — M6281 Muscle weakness (generalized): Secondary | ICD-10-CM | POA: Diagnosis not present

## 2019-12-18 DIAGNOSIS — M6281 Muscle weakness (generalized): Secondary | ICD-10-CM | POA: Diagnosis not present

## 2019-12-18 DIAGNOSIS — J449 Chronic obstructive pulmonary disease, unspecified: Secondary | ICD-10-CM | POA: Diagnosis not present

## 2019-12-18 DIAGNOSIS — M810 Age-related osteoporosis without current pathological fracture: Secondary | ICD-10-CM | POA: Diagnosis not present

## 2019-12-18 DIAGNOSIS — R262 Difficulty in walking, not elsewhere classified: Secondary | ICD-10-CM | POA: Diagnosis not present

## 2019-12-20 DIAGNOSIS — M6281 Muscle weakness (generalized): Secondary | ICD-10-CM | POA: Diagnosis not present

## 2019-12-20 DIAGNOSIS — J449 Chronic obstructive pulmonary disease, unspecified: Secondary | ICD-10-CM | POA: Diagnosis not present

## 2019-12-20 DIAGNOSIS — R262 Difficulty in walking, not elsewhere classified: Secondary | ICD-10-CM | POA: Diagnosis not present

## 2019-12-20 DIAGNOSIS — M810 Age-related osteoporosis without current pathological fracture: Secondary | ICD-10-CM | POA: Diagnosis not present

## 2020-01-01 ENCOUNTER — Other Ambulatory Visit: Payer: Self-pay | Admitting: Adult Health

## 2020-01-01 MED ORDER — TRAMADOL HCL 50 MG PO TABS
50.0000 mg | ORAL_TABLET | Freq: Two times a day (BID) | ORAL | 0 refills | Status: DC
Start: 2020-01-01 — End: 2020-01-28

## 2020-01-01 MED ORDER — PREGABALIN 50 MG PO CAPS: ORAL_CAPSULE | ORAL | 0 refills | Status: DC

## 2020-01-02 ENCOUNTER — Non-Acute Institutional Stay (SKILLED_NURSING_FACILITY): Payer: Medicare Other | Admitting: Adult Health

## 2020-01-02 DIAGNOSIS — I5032 Chronic diastolic (congestive) heart failure: Secondary | ICD-10-CM | POA: Diagnosis not present

## 2020-01-02 DIAGNOSIS — F0150 Vascular dementia without behavioral disturbance: Secondary | ICD-10-CM | POA: Diagnosis not present

## 2020-01-02 DIAGNOSIS — D696 Thrombocytopenia, unspecified: Secondary | ICD-10-CM

## 2020-01-02 NOTE — Progress Notes (Signed)
Location:    [facility] Room Number: 106/D Place of Service:  SNF (31)   CODE STATUS: DNR  Allergies  Allergen Reactions   Other     Band Aids    Tape    Lipitor [Atorvastatin Calcium] Rash and Other (See Comments)    Muscle pain   Niaspan [Niacin Er] Rash and Other (See Comments)    Muscle pain    Chief Complaint  Patient presents with   Acute Visit    Care Plan Meeting    HPI:  We have come together for her care plan meeting. BIMS 5/15 mood 3/30. Her weight is stable at 209 pounds. She cbg readings are stable is on chronic 02. There are no reports of falls; is nonambulatory. She is supervision to extensive assist with her adls. She does feed herself. Is occasionally incontinent of urine is frequently incontinent of bowel. There are no reports of pain present. She continues to be followed for her chronic illnesses including: Thrombocytopenia  Vascular dementia without behavioral disturbance Chronic diastolic congestive heart failure  Past Medical History:  Diagnosis Date   Anemia    Atrial fibrillation (HCC)    Back pain    Bacterial pneumonia    Cancer (HCC)    Skin    Chronic respiratory failure (HCC)    Cognitive communication deficit    COPD (chronic obstructive pulmonary disease) (HCC)    Dementia ([place])    Depression    Diabetes mellitus without complication ([place])    Diverticulosis    Fatty liver    Fibromyalgia    Gastric polyp 09/08/11   at the anastomosis-inflammatory   Generalized anxiety disorder    GERD (gastroesophageal reflux disease)    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)    Hypercholesteremia    Hypertension    Insomnia    Internal hemorrhoids    Muscle weakness    Neuropathy    On home O2    qhs   Osteoporosis    Rheumatoid arteritis ([place])    Ulcer    stomach   Vitamin D deficiency     Past Surgical History:  Procedure Laterality Date   ABDOMINAL HYSTERECTOMY      ABDOMINAL SURGERY     removed patial stomach from ulcer   COLONOSCOPY  04-2001   internal hemorrhoids, panocolonic diverticulosis, and colon polyps    UPPER GASTROINTESTINAL ENDOSCOPY      Social History   Socioeconomic History   Marital status: Widowed    Spouse name: Not on file   Number of children: 6   Years of education: Not on file   Highest education level: Not on file  Occupational History   Occupation: Retired  Tobacco Use   Smoking status: Never Smoker   Smokeless tobacco: Never Used  Scientific laboratory technician Use: Never used  Substance and Sexual Activity   Alcohol use: No   Drug use: No   Sexual activity: Not Currently  Other Topics Concern   Not on file  Social History Narrative   Long term resident of [facility]    Social Determinants of Health   Financial Resource Strain:    Difficulty of Paying Living Expenses: Not on file  Food Insecurity:    Worried About Charity fundraiser in the Last Year: Not on file   [place] in the Last Year: Not on file  Transportation Needs:    Lack of Transportation (Medical): Not on file   Lack of  Transportation (Non-Medical): Not on file  Physical Activity:    Days of Exercise per Week: Not on file   Minutes of Exercise per Session: Not on file  Stress:    Feeling of Stress : Not on file  Social Connections: Unknown   Frequency of Communication with Friends and Family: Never   Frequency of Social Gatherings with Friends and Family: Never   Attends Religious Services: Not on Electrical engineer or Organizations: Not on file   Attends Archivist Meetings: Not on file   Marital Status: Not on file  Intimate Partner Violence:    Fear of Current or Ex-Partner: Not on file   Emotionally Abused: Not on file   Physically Abused: Not on file   Sexually Abused: Not on file   Family History  Problem Relation Age of Onset   Heart disease Father    Kidney disease Mother         kidney cancer   Colon cancer Daughter 56      VITAL SIGNS BP (!) 149/98    Pulse 80    Ht 5\' 3"  (1.6 m)    Wt 209 lb (94.8 kg)    BMI 37.02 kg/m   Outpatient Encounter Medications as of 01/02/2020  Medication Sig   acetaminophen (TYLENOL) 650 MG CR tablet Take 650 mg by mouth every 6 (six) hours.   apixaban (ELIQUIS) 2.5 MG TABS tablet Take 2.5 mg by mouth 2 (two) times daily.   denosumab (PROLIA) 60 MG/ML SOSY injection Inject 60 mg into the skin every 6 (six) months.   diltiazem (CARDIZEM CD) 300 MG 24 hr capsule Take 300 mg by mouth daily.   donepezil (ARICEPT) 5 MG tablet Take 5 mg by mouth at bedtime.   DULoxetine (CYMBALTA) 20 MG capsule Take 40 mg by mouth daily.    Fluticasone-Umeclidin-Vilant (TRELEGY ELLIPTA) 100-62.5-25 MCG/INH AEPB One puff inhalation once a day    furosemide (LASIX) 40 MG tablet Take 40 mg by mouth daily.   guaiFENesin (MUCINEX) 600 MG 12 hr tablet Take 600 mg by mouth 2 (two) times daily.   Menthol, Topical Analgesic, (BIOFREEZE) 4 % GEL Apply to both knees  daily as needed   Menthol, Topical Analgesic, (BIOFREEZE) 4 % GEL Apply to bilateral  shoulders Once A Day PRN   methotrexate (RHEUMATREX) 2.5 MG tablet Take 12.5 mg by mouth once a week. On Sunday - Caution:Chemotherapy. Protect from light.   NON FORMULARY Diet Type:  NAS, Regular  Consistent Carbohydrates, Dysphagia 3   OXYGEN Inhale 3.5 L/min into the lungs continuous.   pantoprazole (PROTONIX) 40 MG tablet Take 40 mg by mouth 2 (two) times daily.   phenylephrine-shark liver oil-mineral oil-petrolatum (PREPARATION H) 0.25-3-14-71.9 % rectal ointment Place 1 application rectally 2 (two) times daily as needed for hemorrhoids.   polyethylene glycol (MIRALAX / GLYCOLAX) packet Take 17 g by mouth daily as needed for moderate constipation.   potassium chloride SA (K-DUR,KLOR-CON) 20 MEQ tablet Take 2 tablets (40 mg ) by mouth twice a day   pregabalin (LYRICA) 50 MG capsule Take 1  capsule by mouth  Twice daily   traMADol (ULTRAM) 50 MG tablet Take 1 tablet (50 mg total) by mouth 2 (two) times daily.   [DISCONTINUED] Folic Acid 5 MG CAPS Take 5 mg by mouth every Sunday.   [DISCONTINUED] rosuvastatin (CRESTOR) 20 MG tablet Take 20 mg by mouth daily.   No facility-administered encounter medications on file as of 01/02/2020.  SIGNIFICANT DIAGNOSTIC EXAMS   PREVIOUS;   01-07-19: chest x-ray; COPD. Aortic atherosclerosis no acute pathology    01-20-19: DEXA: t score -4.997  NO NEW EXAMS.    LABS REVIEWED PREVIOUS :   03-20-19: wbc 4.8; hgb 13.9; hct 44.9; mcv 95.7 plt 97; glucose 168; bun 21; creat 1.00 ;k+ 3.9; na++ 138; ca 9.5 liver normal albumin 3.2  03-27-19: wbc 6.1; hgb 15.1; hct 48.1; mcv 94.9 plt 121  05-17-19: wbc 5.9; hgb 14.1; hct 45.8; mcv 92. 5 plt 112; glucose 134; bun 18; creat 0.94; k+ 3.9; na++ 140; ca 9.9 liver normal albumin 3.3; chol 130 ldl 68; trig 102; hdl 42; hgb a1c 6.4  08-15-19: urine micro-albumin 5.0   TODAY  12-05-19: wbc 6.8; hgb 10.6; hct 36.9; mcv 87.9 plt 104; glucose 188; bun 13; creat 1.04; k+ 3.9; na++ 136; ca 9.2 liver normal albumin 2.8 hgb a1c 7.0 urine micro-albumin <3.0   Review of Systems  Constitutional: Negative for malaise/fatigue.  Respiratory: Negative for cough and shortness of breath.   Cardiovascular: Negative for chest pain, palpitations and leg swelling.  Gastrointestinal: Negative for abdominal pain, constipation and heartburn.  Musculoskeletal: Negative for back pain, joint pain and myalgias.  Skin: Negative.   Neurological: Negative for dizziness.  Psychiatric/Behavioral: The patient is not nervous/anxious.     Physical Exam Constitutional:      General: She is not in acute distress.    Appearance: She is well-developed. She is not diaphoretic.  Neck:     Thyroid: No thyromegaly.  Cardiovascular:     Rate and Rhythm: Normal rate and regular rhythm.     Pulses: Normal pulses.     Heart sounds:  Normal heart sounds.  Pulmonary:     Effort: Pulmonary effort is normal. No respiratory distress.     Breath sounds: Normal breath sounds.     Comments: 02 Abdominal:     General: Bowel sounds are normal. There is no distension.     Palpations: Abdomen is soft.     Tenderness: There is no abdominal tenderness.  Musculoskeletal:     Cervical back: Neck supple.     Right lower leg: No edema.     Left lower leg: No edema.     Comments: Unable to move left upper extremity due to frozen left shoulder Able to move other extremities      Lymphadenopathy:     Cervical: No cervical adenopathy.  Skin:    General: Skin is warm and dry.  Neurological:     Mental Status: She is alert. Mental status is at baseline.  Psychiatric:        Mood and Affect: Mood normal.      ASSESSMENT/ PLAN:  TODAY  1. Thrombocytopenia 2. Vascular dementia without behavioral disturbance 3. Chronic diastolic congestive heart failure  Will continue current medications Will continue current plan of care Will continue to monitor her status.   MD is aware of resident's narcotic use and is in agreement with current plan of care. We will attempt to wean resident as appropriate.  Ok Edwards NP [facility]  Contact [phone] Monday through Friday 8am- 5pm  After hours call [phone]

## 2020-01-03 ENCOUNTER — Encounter: Payer: Self-pay | Admitting: Adult Health

## 2020-01-03 ENCOUNTER — Non-Acute Institutional Stay (SKILLED_NURSING_FACILITY): Payer: Medicare Other | Admitting: Adult Health

## 2020-01-03 DIAGNOSIS — F0150 Vascular dementia without behavioral disturbance: Secondary | ICD-10-CM

## 2020-01-03 DIAGNOSIS — E1122 Type 2 diabetes mellitus with diabetic chronic kidney disease: Secondary | ICD-10-CM

## 2020-01-03 DIAGNOSIS — K219 Gastro-esophageal reflux disease without esophagitis: Secondary | ICD-10-CM | POA: Diagnosis not present

## 2020-01-03 DIAGNOSIS — N183 Chronic kidney disease, stage 3 unspecified: Secondary | ICD-10-CM

## 2020-01-03 DIAGNOSIS — Z66 Do not resuscitate: Secondary | ICD-10-CM | POA: Diagnosis not present

## 2020-01-03 NOTE — Progress Notes (Signed)
Location:    [facility] Room Number: 106-D Place of Service:  SNF (31)   CODE STATUS: DNR  Allergies  Allergen Reactions   Other     Band Aids    Tape    Lipitor [Atorvastatin Calcium] Rash and Other (See Comments)    Muscle pain   Niaspan [Niacin Er] Rash and Other (See Comments)    Muscle pain    Chief Complaint  Patient presents with   Medical Management of Chronic Issues          GERD without esophagitis:  Vascular dementia without behavioral disturbance:  CKD stage 3 due to type 2 diabetes mellitus:    HPI:  She is a 86 year old long term resident of this facility being seen for the management of her chronic illnesses:  GERD without esophagitis:  Vascular dementia without behavioral disturbance:  CKD stage 3 due to type 2 diabetes mellitus. There are no reports of heart burn; no reports of uncontrolled pain; no reports of agitation or anxiety   Past Medical History:  Diagnosis Date   Anemia    Atrial fibrillation (HCC)    Back pain    Bacterial pneumonia    Cancer ([place])    Skin    Chronic respiratory failure (HCC)    Cognitive communication deficit    COPD (chronic obstructive pulmonary disease) (HCC)    Dementia ([place])    Depression    Diabetes mellitus without complication ([place])    Diverticulosis    Fatty liver    Fibromyalgia    Gastric polyp 09/08/11   at the anastomosis-inflammatory   Generalized anxiety disorder    GERD (gastroesophageal reflux disease)    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)    Hypercholesteremia    Hypertension    Insomnia    Internal hemorrhoids    Muscle weakness    Neuropathy    On home O2    qhs   Osteoporosis    Rheumatoid arteritis ([place])    Ulcer    stomach   Vitamin D deficiency     Past Surgical History:  Procedure Laterality Date   ABDOMINAL HYSTERECTOMY     ABDOMINAL SURGERY     removed patial stomach from ulcer   COLONOSCOPY  04-2001   internal  hemorrhoids, panocolonic diverticulosis, and colon polyps    UPPER GASTROINTESTINAL ENDOSCOPY      Social History   Socioeconomic History   Marital status: Widowed    Spouse name: Not on file   Number of children: 6   Years of education: Not on file   Highest education level: Not on file  Occupational History   Occupation: Retired  Tobacco Use   Smoking status: Never Smoker   Smokeless tobacco: Never Used  Scientific laboratory technician Use: Never used  Substance and Sexual Activity   Alcohol use: No   Drug use: No   Sexual activity: Not Currently  Other Topics Concern   Not on file  Social History Narrative   Long term resident of [facility]    Social Determinants of Health   Financial Resource Strain:    Difficulty of Paying Living Expenses: Not on file  Food Insecurity:    Worried About Charity fundraiser in the Last Year: Not on file   [place] in the Last Year: Not on file  Transportation Needs:    Lack of Transportation (Medical): Not on file   Lack of Transportation (Non-Medical): Not on  file  Physical Activity:    Days of Exercise per Week: Not on file   Minutes of Exercise per Session: Not on file  Stress:    Feeling of Stress : Not on file  Social Connections: Unknown   Frequency of Communication with Friends and Family: Never   Frequency of Social Gatherings with Friends and Family: Never   Attends Religious Services: Not on Electrical engineer or Organizations: Not on file   Attends Archivist Meetings: Not on file   Marital Status: Not on file  Intimate Partner Violence:    Fear of Current or Ex-Partner: Not on file   Emotionally Abused: Not on file   Physically Abused: Not on file   Sexually Abused: Not on file   Family History  Problem Relation Age of Onset   Heart disease Father    Kidney disease Mother        kidney cancer   Colon cancer Daughter 82      VITAL SIGNS BP (!) 149/86    Pulse  74    Temp (!) 97.2 F (36.2 C)    Resp 20    Ht 5\' 3"  (1.6 m)    Wt 209 lb (94.8 kg)    SpO2 96%    BMI 37.02 kg/m   Outpatient Encounter Medications as of 01/03/2020  Medication Sig   acetaminophen (TYLENOL) 650 MG CR tablet Take 650 mg by mouth every 6 (six) hours.   apixaban (ELIQUIS) 2.5 MG TABS tablet Take 2.5 mg by mouth 2 (two) times daily.   denosumab (PROLIA) 60 MG/ML SOSY injection Inject 60 mg into the skin every 6 (six) months.   diltiazem (CARDIZEM CD) 300 MG 24 hr capsule Take 300 mg by mouth daily.   donepezil (ARICEPT) 5 MG tablet Take 5 mg by mouth at bedtime.   DULoxetine (CYMBALTA) 20 MG capsule Take 40 mg by mouth daily.    Fluticasone-Umeclidin-Vilant (TRELEGY ELLIPTA) 100-62.5-25 MCG/INH AEPB One puff inhalation once a day    folic acid (FOLVITE) 1 MG tablet Take 5 mg by mouth as directed. Once a day on Sunday   furosemide (LASIX) 40 MG tablet Take 40 mg by mouth daily.   guaiFENesin (MUCINEX) 600 MG 12 hr tablet Take 600 mg by mouth 2 (two) times daily.   Menthol, Topical Analgesic, (BIOFREEZE) 4 % GEL Apply to both knees  daily as needed   Menthol, Topical Analgesic, (BIOFREEZE) 4 % GEL Apply to bilateral  shoulders Once A Day PRN   methotrexate (RHEUMATREX) 2.5 MG tablet Take 12.5 mg by mouth once a week. On Sunday - Caution:Chemotherapy. Protect from light.   NON FORMULARY Diet Type:  NAS, Regular  Consistent Carbohydrates, Dysphagia 3   OXYGEN Inhale 3.5 L/min into the lungs continuous.   pantoprazole (PROTONIX) 40 MG tablet Take 40 mg by mouth 2 (two) times daily.   phenylephrine-shark liver oil-mineral oil-petrolatum (PREPARATION H) 0.25-3-14-71.9 % rectal ointment Place 1 application rectally 2 (two) times daily as needed for hemorrhoids.   polyethylene glycol (MIRALAX / GLYCOLAX) packet Take 17 g by mouth daily as needed for moderate constipation.   potassium chloride SA (K-DUR,KLOR-CON) 20 MEQ tablet Take 2 tablets (40 mg ) by mouth twice a  day   pregabalin (LYRICA) 50 MG capsule Take 1 capsule by mouth  Twice daily   traMADol (ULTRAM) 50 MG tablet Take 1 tablet (50 mg total) by mouth 2 (two) times daily.   [DISCONTINUED] Folic Acid  5 MG CAPS Take 5 mg by mouth every Sunday.   No facility-administered encounter medications on file as of 01/03/2020.     SIGNIFICANT DIAGNOSTIC EXAMS   PREVIOUS;   01-07-19: chest x-ray; COPD. Aortic atherosclerosis no acute pathology    01-20-19: DEXA: t score -4.997  NO NEW EXAMS.    LABS REVIEWED PREVIOUS :   03-20-19: wbc 4.8; hgb 13.9; hct 44.9; mcv 95.7 plt 97; glucose 168; bun 21; creat 1.00 ;k+ 3.9; na++ 138; ca 9.5 liver normal albumin 3.2  03-27-19: wbc 6.1; hgb 15.1; hct 48.1; mcv 94.9 plt 121  05-17-19: wbc 5.9; hgb 14.1; hct 45.8; mcv 92. 5 plt 112; glucose 134; bun 18; creat 0.94; k+ 3.9; na++ 140; ca 9.9 liver normal albumin 3.3; chol 130 ldl 68; trig 102; hdl 42; hgb a1c 6.4  08-15-19: urine micro-albumin 5.0  12-05-19: wbc 6.8; hgb 10.6; hct 36.9; mcv 87.9 plt 104; glucose 188; bun 13; creat 1.04; k+ 3.9; na++ 136; ca 9.2 liver normal albumin 2.8 hgb a1c 7.0 urine micro-albumin <3.0   NO NEW LABS   Review of Systems  Constitutional: Negative for malaise/fatigue.  Respiratory: Negative for cough and shortness of breath.   Cardiovascular: Negative for chest pain, palpitations and leg swelling.  Gastrointestinal: Negative for abdominal pain, constipation and heartburn.  Musculoskeletal: Negative for back pain, joint pain and myalgias.  Skin: Negative.   Neurological: Negative for dizziness.  Psychiatric/Behavioral: The patient is not nervous/anxious.     Physical Exam Constitutional:      General: She is not in acute distress.    Appearance: She is well-developed. She is not diaphoretic.  Neck:     Thyroid: No thyromegaly.  Cardiovascular:     Rate and Rhythm: Normal rate and regular rhythm.     Pulses: Normal pulses.     Heart sounds: Normal heart sounds.    Pulmonary:     Effort: Pulmonary effort is normal. No respiratory distress.     Breath sounds: Normal breath sounds.     Comments: 02 Abdominal:     General: Bowel sounds are normal. There is no distension.     Palpations: Abdomen is soft.     Tenderness: There is no abdominal tenderness.  Musculoskeletal:     Cervical back: Neck supple.     Right lower leg: No edema.     Left lower leg: No edema.     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  Skin:    General: Skin is warm and dry.  Neurological:     Mental Status: She is alert. Mental status is at baseline.  Psychiatric:        Mood and Affect: Mood normal.       ASSESSMENT/ PLAN:  TODAY:   1. GERD without esophagitis: is stable will continue protonix 40 mg twice daily   2. Vascular dementia without behavioral disturbance: is without change weight is 209 pounds; will continue aricept 5 mg daily   3. CKD stage 3 due to type 2 diabetes mellitus: is stable bun 13 creat 1.04    PREVIOUS   4. Diabetic peripheral neuropathy associated with type 2 diabetes mellitus is stable will continue lyrica 50 mg twice daily   5. Fibromyalgia: is stable will continue lyrica 50 mg twice daily cymbalta 40 mg daily   6. Rheumatoid arteritis: is  stable will continue methotrexate 78.4 mg weekly folic acid 5 mg weekly   7. Post-menopausal osteoporosis is stable t score -4.997 (01-20-19) will continue prolia 60 mg every 6 months  8. Chronic constipation: is stable will continue miralax daily as needed   9. Type 2 diabetes mellitus with peripheral neuropathy is stable hgb a1c 6.4 is off tradjenta will monitor is on statin eliquis  10. Dyslipidemia associated with type 2 diabetes mellitus is stable LDL 68 will stop crestor due to her advanced age.   11. Atrial fibrillation with RVR heart rate is stable will continue  cardizem cd 300 mg daily for rate control and eliquis 2.5 mg twice daily   12. Chronic diastolic heart failure EF 55-60% (07-28-16) will continue lasix 40 mg daily with k+ 40 meq twice daily   13. Hypokalemia: is stable k+ 3.9 will continue k+ 40 meq twice daily   14. Unspecified COPD/ chronic respiratory failure with hypoxia is stable is 02 dependent will continue trelegy ellipta 100-62.5-25 mcg 1 puff daily        MD is aware of resident's narcotic use and is in agreement with current plan of care. We will attempt to wean resident as appropriate.  Ok Edwards NP [facility]  Contact [phone] Monday through Friday 8am- 5pm  After hours call [phone]

## 2020-01-06 DIAGNOSIS — D696 Thrombocytopenia, unspecified: Secondary | ICD-10-CM | POA: Insufficient documentation

## 2020-01-13 ENCOUNTER — Other Ambulatory Visit (HOSPITAL_COMMUNITY)
Admission: RE | Admit: 2020-01-13 | Discharge: 2020-01-13 | Disposition: A | Discharge status: Home / Self Care | Payer: Medicare Other | Source: Skilled Nursing Facility | Attending: Adult Health | Admitting: Adult Health

## 2020-01-13 ENCOUNTER — Encounter: Payer: Self-pay | Admitting: Adult Health

## 2020-01-13 ENCOUNTER — Non-Acute Institutional Stay (SKILLED_NURSING_FACILITY): Payer: Medicare Other | Admitting: Adult Health

## 2020-01-13 DIAGNOSIS — I361 Nonrheumatic tricuspid (valve) insufficiency: Secondary | ICD-10-CM | POA: Diagnosis not present

## 2020-01-13 DIAGNOSIS — J449 Chronic obstructive pulmonary disease, unspecified: Secondary | ICD-10-CM | POA: Diagnosis not present

## 2020-01-13 DIAGNOSIS — I517 Cardiomegaly: Secondary | ICD-10-CM | POA: Diagnosis not present

## 2020-01-13 DIAGNOSIS — I350 Nonrheumatic aortic (valve) stenosis: Secondary | ICD-10-CM | POA: Diagnosis not present

## 2020-01-13 DIAGNOSIS — I5032 Chronic diastolic (congestive) heart failure: Secondary | ICD-10-CM | POA: Diagnosis not present

## 2020-01-13 DIAGNOSIS — I340 Nonrheumatic mitral (valve) insufficiency: Secondary | ICD-10-CM | POA: Diagnosis not present

## 2020-01-13 LAB — BASIC METABOLIC PANEL
Anion gap: 6 (ref 5–15)
BUN: 14 mg/dL (ref 8–23)
CO2: 30 mmol/L (ref 22–32)
Calcium: 9.4 mg/dL (ref 8.9–10.3)
Chloride: 101 mmol/L (ref 98–111)
Creatinine, Ser: 1.21 mg/dL — ABNORMAL HIGH (ref 0.44–1.00)
GFR, Estimated: 43 mL/min — ABNORMAL LOW (ref >60)
Glucose, Bld: 224 mg/dL — ABNORMAL HIGH (ref 70–99)
Potassium: 4.3 mmol/L (ref 3.5–5.1)
Sodium: 137 mmol/L (ref 135–145)

## 2020-01-13 LAB — BRAIN NATRIURETIC PEPTIDE: B Natriuretic Peptide: 144 pg/mL — ABNORMAL HIGH (ref 0.0–100.0)

## 2020-01-13 NOTE — Progress Notes (Signed)
Location:    [place] Room Number: 106/D  Place of Service:  SNF (31)   CODE STATUS: DNR  Allergies  Allergen Reactions  . Other     Band Aids   . Tape   . Lipitor [Atorvastatin Calcium] Rash and Other (See Comments)    Muscle pain  . Niaspan [Niacin Er] Rash and Other (See Comments)    Muscle pain    Chief Complaint  Patient presents with  . Acute Visit    Weight Gain    HPI:  She is gaining weight from 2111 pounds pounds on 12-14-19 to her weight of 221.4 pounds. She denies nay cough or shortness of breath; is able to lya flat in bed. Does have increased edema present. She doe shave a history of chf and is taking lasix daily.   Past Medical History:  Diagnosis Date  . Anemia   . Atrial fibrillation ([place])   . Back pain   . Bacterial pneumonia   . Cancer (HCC)    Skin   . Chronic respiratory failure ([place])   . Cognitive communication deficit   . COPD (chronic obstructive pulmonary disease) ([place])   . Dementia ([place])   . Depression   . Diabetes mellitus without complication ([place])   . Diverticulosis   . Fatty liver   . Fibromyalgia   . Gastric polyp 09/08/11   at the anastomosis-inflammatory  . Generalized anxiety disorder   . GERD (gastroesophageal reflux disease)   . 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 Hypercholesteremia   . Hypertension   . Insomnia   . Internal hemorrhoids   . Muscle weakness   . Neuropathy   . On home O2    qhs  . Osteoporosis   . Rheumatoid arteritis ([place])   . Ulcer    stomach  . Vitamin D deficiency     Past Surgical History:  Procedure Laterality Date  . ABDOMINAL HYSTERECTOMY    . ABDOMINAL SURGERY     removed patial stomach from ulcer  . COLONOSCOPY  04-2001   internal hemorrhoids, panocolonic diverticulosis, and colon polyps   . UPPER GASTROINTESTINAL ENDOSCOPY      Social History   Socioeconomic History  . Marital status: Widowed    Spouse name: Not on file  . Number of children: 6    . Years of education: Not on file  . Highest education level: Not on file  Occupational History  . Occupation: Retired  Tobacco Use  . Smoking status: Never Smoker  . Smokeless tobacco: Never Used  Vaping Use  . Vaping Use: Never used  Substance and Sexual Activity  . Alcohol use: No  . Drug use: No  . Sexual activity: Not Currently  Other Topics Concern  . Not on file  Social History Narrative   Long term resident of [facility]    Social Determinants of Health   Financial Resource Strain:   . Difficulty of Paying Living Expenses: Not on file  Food Insecurity:   . Worried About Charity fundraiser in the Last Year: Not on file  . Ran Out of Food in the Last Year: Not on file  Transportation Needs:   . Lack of Transportation (Medical): Not on file  . Lack of Transportation (Non-Medical): Not on file  Physical Activity:   . Days of Exercise per Week: Not on file  . Minutes of Exercise per Session: Not on file  Stress:   . Feeling of Stress : Not  on file  Social Connections: Unknown  . Frequency of Communication with Friends and Family: Never  . Frequency of Social Gatherings with Friends and Family: Never  . Attends Religious Services: Not on file  . Active Member of Clubs or Organizations: Not on file  . Attends Archivist Meetings: Not on file  . Marital Status: Not on file  Intimate Partner Violence:   . Fear of Current or Ex-Partner: Not on file  . Emotionally Abused: Not on file  . Physically Abused: Not on file  . Sexually Abused: Not on file   Family History  Problem Relation Age of Onset  . Heart disease Father   . Kidney disease Mother        kidney cancer  . Colon cancer Daughter 53      VITAL SIGNS BP (!) 154/86   Pulse 95   Temp 97.8 F (36.6 C)   Resp 20   Ht 5\' 3"  (1.6 m)   Wt 209 lb (94.8 kg)   SpO2 90%   BMI 37.02 kg/m   Outpatient Encounter Medications as of 01/13/2020  Medication Sig  . acetaminophen (TYLENOL) 650 MG CR tablet  Take 650 mg by mouth every 6 (six) hours.  Marland Kitchen apixaban (ELIQUIS) 2.5 MG TABS tablet Take 2.5 mg by mouth 2 (two) times daily.  Marland Kitchen denosumab (PROLIA) 60 MG/ML SOSY injection Inject 60 mg into the skin every 6 (six) months.  . diltiazem (CARDIZEM CD) 300 MG 24 hr capsule Take 300 mg by mouth daily.  Marland Kitchen donepezil (ARICEPT) 5 MG tablet Take 5 mg by mouth at bedtime.  . DULoxetine (CYMBALTA) 20 MG capsule Take 40 mg by mouth daily.   . Fluticasone-Umeclidin-Vilant (TRELEGY ELLIPTA) 100-62.5-25 MCG/INH AEPB One puff inhalation once a day   . folic acid (FOLVITE) 1 MG tablet Take 5 mg by mouth as directed. Once a day on Sunday  . furosemide (LASIX) 40 MG tablet Take 40 mg by mouth daily. Stop on 01/16/2020, And Re-start 40 mg by mouth on 02/05/2020  . guaiFENesin (MUCINEX) 600 MG 12 hr tablet Take 600 mg by mouth 2 (two) times daily.  . Menthol, Topical Analgesic, (BIOFREEZE) 4 % GEL Apply to both knees  daily as needed  . Menthol, Topical Analgesic, (BIOFREEZE) 4 % GEL Apply to bilateral  shoulders Once A Day PRN  . methotrexate (RHEUMATREX) 2.5 MG tablet Take 12.5 mg by mouth once a week. On Sunday - Caution:Chemotherapy. Protect from light.  . NON FORMULARY Diet Type:  NAS, Regular  Consistent Carbohydrates, Dysphagia 3  . OXYGEN Inhale 3.5 L/min into the lungs continuous.  . pantoprazole (PROTONIX) 40 MG tablet Take 40 mg by mouth 2 (two) times daily.  . phenylephrine-shark liver oil-mineral oil-petrolatum (PREPARATION H) 0.25-3-14-71.9 % rectal ointment Place 1 application rectally 2 (two) times daily as needed for hemorrhoids.  . polyethylene glycol (MIRALAX / GLYCOLAX) packet Take 17 g by mouth daily as needed for moderate constipation.  . potassium chloride SA (K-DUR,KLOR-CON) 20 MEQ tablet Take 40 mEq by mouth daily. From 01/13/2020-01/16/2020, Start 40 mg by mouth twice a day on 02/05/2020  . pregabalin (LYRICA) 50 MG capsule Take 1 capsule by mouth  Twice daily  . traMADol (ULTRAM) 50 MG tablet  Take 1 tablet (50 mg total) by mouth 2 (two) times daily.   No facility-administered encounter medications on file as of 01/13/2020.     SIGNIFICANT DIAGNOSTIC EXAMS   PREVIOUS;   01-07-19: chest x-ray; COPD. Aortic atherosclerosis no  acute pathology    01-20-19: DEXA: t score -4.997  NO NEW EXAMS.    LABS REVIEWED PREVIOUS :   03-20-19: wbc 4.8; hgb 13.9; hct 44.9; mcv 95.7 plt 97; glucose 168; bun 21; creat 1.00 ;k+ 3.9; na++ 138; ca 9.5 liver normal albumin 3.2  03-27-19: wbc 6.1; hgb 15.1; hct 48.1; mcv 94.9 plt 121  05-17-19: wbc 5.9; hgb 14.1; hct 45.8; mcv 92. 5 plt 112; glucose 134; bun 18; creat 0.94; k+ 3.9; na++ 140; ca 9.9 liver normal albumin 3.3; chol 130 ldl 68; trig 102; hdl 42; hgb a1c 6.4  08-15-19: urine micro-albumin 5.0  12-05-19: wbc 6.8; hgb 10.6; hct 36.9; mcv 87.9 plt 104; glucose 188; bun 13; creat 1.04; k+ 3.9; na++ 136; ca 9.2 liver normal albumin 2.8 hgb a1c 7.0 urine micro-albumin <3.0   NO NEW LABS  Review of Systems  Constitutional: Negative for malaise/fatigue.  Respiratory: Negative for cough and shortness of breath.   Cardiovascular: Positive for leg swelling. Negative for chest pain and palpitations.  Gastrointestinal: Negative for abdominal pain, constipation and heartburn.  Musculoskeletal: Negative for back pain, joint pain and myalgias.  Skin: Negative.   Neurological: Negative for dizziness.  Psychiatric/Behavioral: The patient is not nervous/anxious.     Physical Exam Constitutional:      General: She is not in acute distress.    Appearance: She is well-developed. She is not diaphoretic.  Neck:     Thyroid: No thyromegaly.  Cardiovascular:     Rate and Rhythm: Normal rate and regular rhythm.     Pulses: Normal pulses.     Heart sounds: Normal heart sounds.  Pulmonary:     Effort: Pulmonary effort is normal. No respiratory distress.     Breath sounds: Normal breath sounds.     Comments: 02 Abdominal:     General: Bowel sounds are  normal. There is no distension.     Palpations: Abdomen is soft.     Tenderness: There is no abdominal tenderness.  Musculoskeletal:        General: Normal range of motion.     Cervical back: Neck supple.     Right lower leg: Edema present.     Left lower leg: Edema present.  Lymphadenopathy:     Cervical: No cervical adenopathy.  Skin:    General: Skin is warm and dry.  Neurological:     Mental Status: She is alert. Mental status is at baseline.  Psychiatric:        Mood and Affect: Mood normal.       ASSESSMENT/ PLAN:  TODAY  1. Chronic diastolic congestive heart failure  Is worse Will get a chest x-ray and 2-d echo Will give an extra lasix 04 mg with k+ 40 meq through 01-16-20 Will check bmp bnp  Will monitor her status.    MD is aware of resident's narcotic use and is in agreement with current plan of care. We will attempt to wean resident as appropriate.  Ok Edwards NP [facility]  Contact [phone] Monday through Friday 8am- 5pm  After hours call [phone]

## 2020-01-16 DIAGNOSIS — Z23 Encounter for immunization: Secondary | ICD-10-CM | POA: Diagnosis not present

## 2020-01-20 ENCOUNTER — Other Ambulatory Visit (HOSPITAL_COMMUNITY)
Admission: RE | Admit: 2020-01-20 | Discharge: 2020-01-20 | Disposition: A | Discharge status: Home / Self Care | Payer: Medicare Other | Source: Skilled Nursing Facility | Attending: Adult Health | Admitting: Adult Health

## 2020-01-20 DIAGNOSIS — I130 Hypertensive heart and chronic kidney disease with heart failure and stage 1 through stage 4 chronic kidney disease, or unspecified chronic kidney disease: Secondary | ICD-10-CM | POA: Diagnosis not present

## 2020-01-20 LAB — BASIC METABOLIC PANEL
Anion gap: 7 (ref 5–15)
BUN: 14 mg/dL (ref 8–23)
CO2: 29 mmol/L (ref 22–32)
Calcium: 9.9 mg/dL (ref 8.9–10.3)
Chloride: 104 mmol/L (ref 98–111)
Creatinine, Ser: 1.03 mg/dL — ABNORMAL HIGH (ref 0.44–1.00)
GFR, Estimated: 52 mL/min — ABNORMAL LOW (ref >60)
Glucose, Bld: 152 mg/dL — ABNORMAL HIGH (ref 70–99)
Potassium: 4.2 mmol/L (ref 3.5–5.1)
Sodium: 140 mmol/L (ref 135–145)

## 2020-01-24 DIAGNOSIS — E1151 Type 2 diabetes mellitus with diabetic peripheral angiopathy without gangrene: Secondary | ICD-10-CM | POA: Diagnosis not present

## 2020-01-24 DIAGNOSIS — B351 Tinea unguium: Secondary | ICD-10-CM | POA: Diagnosis not present

## 2020-01-24 DIAGNOSIS — Z7984 Long term (current) use of oral hypoglycemic drugs: Secondary | ICD-10-CM | POA: Diagnosis not present

## 2020-01-24 DIAGNOSIS — L603 Nail dystrophy: Secondary | ICD-10-CM | POA: Diagnosis not present

## 2020-01-28 ENCOUNTER — Other Ambulatory Visit: Payer: Self-pay | Admitting: Adult Health

## 2020-01-28 DIAGNOSIS — M8588 Other specified disorders of bone density and structure, other site: Secondary | ICD-10-CM | POA: Diagnosis not present

## 2020-01-28 DIAGNOSIS — M1611 Unilateral primary osteoarthritis, right hip: Secondary | ICD-10-CM | POA: Diagnosis not present

## 2020-01-28 MED ORDER — PREGABALIN 50 MG PO CAPS: ORAL_CAPSULE | ORAL | 0 refills | Status: DC

## 2020-01-28 MED ORDER — TRAMADOL HCL 50 MG PO TABS
50.0000 mg | ORAL_TABLET | Freq: Two times a day (BID) | ORAL | 0 refills | Status: DC
Start: 2020-01-28 — End: 2020-02-25

## 2020-02-03 ENCOUNTER — Non-Acute Institutional Stay (SKILLED_NURSING_FACILITY): Payer: Medicare Other | Admitting: Adult Health

## 2020-02-03 ENCOUNTER — Encounter: Payer: Self-pay | Admitting: Adult Health

## 2020-02-03 DIAGNOSIS — E1142 Type 2 diabetes mellitus with diabetic polyneuropathy: Secondary | ICD-10-CM

## 2020-02-03 DIAGNOSIS — M797 Fibromyalgia: Secondary | ICD-10-CM | POA: Diagnosis not present

## 2020-02-03 DIAGNOSIS — M0520 Rheumatoid vasculitis with rheumatoid arthritis of unspecified site: Secondary | ICD-10-CM | POA: Diagnosis not present

## 2020-02-03 NOTE — Progress Notes (Signed)
Location:    [facility] Room Number: 106/D Place of Service:  SNF (31)   CODE STATUS: DNR  Allergies  Allergen Reactions   Other     Band Aids    Tape    Lipitor [Atorvastatin Calcium] Rash and Other (See Comments)    Muscle pain   Niaspan [Niacin Er] Rash and Other (See Comments)    Muscle pain    Chief Complaint  Patient presents with   Medical Management of Chronic Issues          Diabetic peripheral neuropathy associated with type 2 diabetes mellitus:  Fibromyalgia: Rheumatoid arteritis:    HPI:  She is a 86 year old long term resident of this facility being seen for the management of her chronic illnesses: Diabetic peripheral neuropathy associated with type 2 diabetes mellitus:  Fibromyalgia: Rheumatoid arteritis. There are no reports of uncontrolled pain; there are no reports of anxiety or depressive thoughts. There are no reports of heart burn.   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)    Hypercholesteremia    Hypertension    Insomnia    Internal hemorrhoids    Muscle weakness    Neuropathy    On home O2    qhs   Osteoporosis    Rheumatoid arteritis ([place])    Ulcer    stomach   Vitamin D deficiency     Past Surgical History:  Procedure Laterality Date   ABDOMINAL HYSTERECTOMY     ABDOMINAL SURGERY     removed patial stomach from ulcer   COLONOSCOPY  04-2001    internal hemorrhoids, panocolonic diverticulosis, and colon polyps    UPPER GASTROINTESTINAL ENDOSCOPY      Social History   Socioeconomic History   Marital status: Widowed    Spouse name: Not on file   Number of children: 6   Years of education: Not on file   Highest education level: Not on file  Occupational History   Occupation: Retired  Tobacco Use   Smoking status: Never Smoker   Smokeless tobacco: Never Used  Scientific laboratory technician Use: Never used  Substance and Sexual Activity   Alcohol use: No   Drug use: No   Sexual activity: Not Currently  Other Topics Concern   Not on file  Social History Narrative   Long term resident of [facility]    Social Determinants of Health   Financial Resource Strain:    Difficulty of Paying Living Expenses: Not on file  Food Insecurity:    Worried About Charity fundraiser in the Last Year: Not on file   [place] in the Last Year: Not on file  Transportation Needs:    Lack of Transportation (Medical): Not on file   Lack of Transportation (Non-Medical): Not on file  Physical Activity:    Days  of Exercise per Week: Not on file   Minutes of Exercise per Session: Not on file  Stress:    Feeling of Stress : Not on file  Social Connections: Unknown   Frequency of Communication with Friends and Family: Never   Frequency of Social Gatherings with Friends and Family: Never   Attends Religious Services: Not on Electrical engineer or Organizations: Not on file   Attends Archivist Meetings: Not on file   Marital Status: Not on file  Intimate Partner Violence:    Fear of Current or Ex-Partner: Not on file   Emotionally Abused: Not on file   Physically Abused: Not on file   Sexually Abused: Not on file   Family History  Problem Relation Age of Onset   Heart disease Father    Kidney disease Mother        kidney cancer   Colon cancer Daughter 68      VITAL SIGNS BP (!) 146/90     Pulse 75    Temp 97.6 F (36.4 C)    Resp 20    Ht 5\' 3"  (1.6 m)    Wt 221 lb 6.4 oz (100.4 kg)    SpO2 93%    BMI 39.22 kg/m   Outpatient Encounter Medications as of 02/03/2020  Medication Sig   acetaminophen (TYLENOL) 650 MG CR tablet Take 650 mg by mouth every 6 (six) hours.   apixaban (ELIQUIS) 2.5 MG TABS tablet Take 2.5 mg by mouth 2 (two) times daily.   denosumab (PROLIA) 60 MG/ML SOSY injection Inject 60 mg into the skin every 6 (six) months.   diltiazem (CARTIA XT) 300 MG 24 hr capsule Take 300 mg by mouth daily.   donepezil (ARICEPT) 5 MG tablet Take 5 mg by mouth at bedtime.   DULoxetine (CYMBALTA) 20 MG capsule Take 40 mg by mouth daily.    Fluticasone-Umeclidin-Vilant (TRELEGY ELLIPTA) 100-62.5-25 MCG/INH AEPB One puff inhalation once a day    folic acid (FOLVITE) 1 MG tablet Take 5 mg by mouth as directed. Once a day on Sunday   furosemide (LASIX) 40 MG tablet Take 40 mg by mouth daily.   guaiFENesin (MUCINEX) 600 MG 12 hr tablet Take 600 mg by mouth 2 (two) times daily.   Menthol, Topical Analgesic, (BIOFREEZE) 4 % GEL Apply to both knees  daily as needed   Menthol, Topical Analgesic, (BIOFREEZE) 4 % GEL Apply to bilateral  shoulders Once A Day PRN   methotrexate (RHEUMATREX) 2.5 MG tablet Take 12.5 mg by mouth once a week. On Sunday - Caution:Chemotherapy. Protect from light.   NON FORMULARY Diet Type:  NAS, Regular  Consistent Carbohydrates, Dysphagia 3   OXYGEN Inhale 3.5 L/min into the lungs continuous.   pantoprazole (PROTONIX) 40 MG tablet Take 40 mg by mouth 2 (two) times daily.   phenylephrine-shark liver oil-mineral oil-petrolatum (PREPARATION H) 0.25-3-14-71.9 % rectal ointment Place 1 application rectally 2 (two) times daily as needed for hemorrhoids.   polyethylene glycol (MIRALAX / GLYCOLAX) packet Take 17 g by mouth daily as needed for moderate constipation.   potassium chloride SA (K-DUR,KLOR-CON) 20 MEQ tablet Take 40 mEq by mouth 2 (two)  times daily. From 01/13/2020-01/16/2020, Start 40 mg by mouth twice a day on 02/05/2020   pregabalin (LYRICA) 50 MG capsule Take 1 capsule by mouth  Twice daily   traMADol (ULTRAM) 50 MG tablet Take 1 tablet (50 mg total) by mouth 2 (two) times daily.   [  DISCONTINUED] diltiazem (CARDIZEM CD) 300 MG 24 hr capsule Take 300 mg by mouth daily.   [DISCONTINUED] furosemide (LASIX) 40 MG tablet Take 40 mg by mouth daily. Stop on 01/16/2020, And Re-start 40 mg by mouth on 02/05/2020   No facility-administered encounter medications on file as of 02/03/2020.     SIGNIFICANT DIAGNOSTIC EXAMS  PREVIOUS;   01-07-19: chest x-ray; COPD. Aortic atherosclerosis no acute pathology    01-20-19: DEXA: t score -4.997  NO NEW EXAMS.    LABS REVIEWED PREVIOUS :   03-20-19: wbc 4.8; hgb 13.9; hct 44.9; mcv 95.7 plt 97; glucose 168; bun 21; creat 1.00 ;k+ 3.9; na++ 138; ca 9.5 liver normal albumin 3.2  03-27-19: wbc 6.1; hgb 15.1; hct 48.1; mcv 94.9 plt 121  05-17-19: wbc 5.9; hgb 14.1; hct 45.8; mcv 92. 5 plt 112; glucose 134; bun 18; creat 0.94; k+ 3.9; na++ 140; ca 9.9 liver normal albumin 3.3; chol 130 ldl 68; trig 102; hdl 42; hgb a1c 6.4  08-15-19: urine micro-albumin 5.0  12-05-19: wbc 6.8; hgb 10.6; hct 36.9; mcv 87.9 plt 104; glucose 188; bun 13; creat 1.04; k+ 3.9; na++ 136; ca 9.2 liver normal albumin 2.8 hgb a1c 7.0 urine micro-albumin <3.0  TODAY  01-13-20: glucose 224; bun 14; creat 1.21; k+ 4.3; n++ 137; ca 9.4 BNP 144.0 01-20-20: glucose 152; bun 14; creat 1.03 ;k+ 4.2; na++ 140 ca 9.9  Review of Systems  Constitutional: Negative for malaise/fatigue.  Respiratory: Negative for cough and shortness of breath.   Cardiovascular: Positive for leg swelling. Negative for chest pain and palpitations.  Gastrointestinal: Negative for abdominal pain, constipation and heartburn.  Musculoskeletal: Negative for back pain, joint pain and myalgias.  Skin: Negative.   Neurological: Negative for dizziness.   Psychiatric/Behavioral: The patient is not nervous/anxious.       Physical Exam Constitutional:      General: She is not in acute distress.    Appearance: She is well-developed. She is not diaphoretic.  Neck:     Thyroid: No thyromegaly.  Cardiovascular:     Rate and Rhythm: Normal rate and regular rhythm.     Heart sounds: Normal heart sounds.  Pulmonary:     Effort: Pulmonary effort is normal. No respiratory distress.     Breath sounds: Normal breath sounds.     Comments: 02 Abdominal:     General: Bowel sounds are normal. There is no distension.     Palpations: Abdomen is soft.     Tenderness: There is no abdominal tenderness.  Musculoskeletal:     Cervical back: Neck supple.     Right lower leg: Edema present.     Left lower leg: Edema present.     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  Skin:    General: Skin is warm and dry.  Neurological:     Mental Status: She is alert. Mental status is at baseline.  Psychiatric:        Mood and Affect: Mood normal.      ASSESSMENT/ PLAN:  TODAY:   1. Diabetic peripheral neuropathy associated with type 2 diabetes mellitus: is stable will continue lyrica 50 mg twice daily   2. Fibromyalgia: is stable will continue lyrica 50 mg twice daily and cymbalta 40 mg daily   3. Rheumatoid arteritis: is stable will continue methotrexate 63.0 mg weekly folic acid 5  mg weekly    PREVIOUS   4. Post-menopausal osteoporosis is stable t score -4.997 (01-20-19) will continue prolia 60 mg every 6 months  5. Chronic constipation: is stable will continue miralax daily as needed   6. Type 2 diabetes mellitus with peripheral neuropathy is stable hgb a1c 6.4 is off tradjenta will monitor is on statin eliquis  7. Dyslipidemia associated with type 2 diabetes mellitus is stable LDL 68 will stop crestor due to  her advanced age.   8. Atrial fibrillation with RVR heart rate is stable will continue cardizem cd 300 mg daily for rate control and eliquis 2.5 mg twice daily   9. Chronic diastolic heart failure EF 55-60% (07-28-16) will continue lasix 40 mg daily with k+ 40 meq twice daily   10. Hypokalemia: is stable k+ 4.2 will continue k+ 40 meq twice daily   11. Unspecified COPD/ chronic respiratory failure with hypoxia is stable is 02 dependent will continue trelegy ellipta 100-62.5-25 mcg 1 puff daily   12. GERD without esophagitis: is stable will continue protonix 40 mg twice daily   13. Vascular dementia without behavioral disturbance: is without change weight is 221 pounds; will continue aricept 5 mg daily   14. CKD stage 3 due to type 2 diabetes mellitus: is stable bun 14 creat 1.03      MD is aware of resident's narcotic use and is in agreement with current plan of care. We will attempt to wean resident as appropriate.  Ok Edwards NP [facility]  Contact [phone] Monday through Friday 8am- 5pm  After hours call [phone]

## 2020-02-11 DIAGNOSIS — Z1159 Encounter for screening for other viral diseases: Secondary | ICD-10-CM | POA: Diagnosis not present

## 2020-02-11 DIAGNOSIS — J449 Chronic obstructive pulmonary disease, unspecified: Secondary | ICD-10-CM | POA: Diagnosis not present

## 2020-02-11 DIAGNOSIS — I129 Hypertensive chronic kidney disease with stage 1 through stage 4 chronic kidney disease, or unspecified chronic kidney disease: Secondary | ICD-10-CM | POA: Diagnosis not present

## 2020-02-25 ENCOUNTER — Encounter: Payer: Self-pay | Admitting: Adult Health

## 2020-02-25 ENCOUNTER — Non-Acute Institutional Stay: Payer: Self-pay | Admitting: Adult Health

## 2020-02-25 ENCOUNTER — Other Ambulatory Visit: Payer: Self-pay | Admitting: Adult Health

## 2020-02-25 DIAGNOSIS — M0520 Rheumatoid vasculitis with rheumatoid arthritis of unspecified site: Secondary | ICD-10-CM | POA: Diagnosis not present

## 2020-02-25 DIAGNOSIS — F339 Major depressive disorder, recurrent, unspecified: Secondary | ICD-10-CM | POA: Diagnosis not present

## 2020-02-25 MED ORDER — PREGABALIN 50 MG PO CAPS: ORAL_CAPSULE | ORAL | 0 refills | Status: DC

## 2020-02-25 MED ORDER — TRAMADOL HCL 50 MG PO TABS
25.0000 mg | ORAL_TABLET | Freq: Two times a day (BID) | ORAL | 0 refills | Status: DC
Start: 2020-02-25 — End: 2020-03-23

## 2020-02-25 NOTE — Progress Notes (Signed)
Location:  [facility] Room Number: 106-D Place of Service:  SNF (22) [place]: DNR  Allergies  Allergen Reactions  . Other     Band Aids   . Tape   . Lipitor [Atorvastatin Calcium] Rash and Other (See Comments)    Muscle pain  . Niaspan [Niacin Er] Rash and Other (See Comments)    Muscle pain    Chief Complaint  Patient presents with  . Acute Visit    Medication review    HPI:  She is presently taking ultram 50 mg twice daily tylenol cr four times daily and cymbalta 40 mg daily  (for both mood pain). There are no reports of uncontrolled pain; no reports of anxiety or depressive thoughts. She is due to [place] will attempt to lower her ultram.   Past Medical History:  Diagnosis Date  . Anemia   . Atrial fibrillation ([place])   . Back pain   . Bacterial pneumonia   . Cancer (HCC)    Skin   . Chronic respiratory failure ([place])   . Cognitive communication deficit   . COPD (chronic obstructive pulmonary disease) ([place])   . Dementia ([place])   . Depression   . Diabetes mellitus without complication ([place])   . Diverticulosis   . Fatty liver   . Fibromyalgia   . Gastric polyp 09/08/11   at the anastomosis-inflammatory  . Generalized anxiety disorder   . GERD (gastroesophageal reflux disease)   . 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 Hypercholesteremia   . Hypertension   . Insomnia   . Internal hemorrhoids   . Muscle weakness   . Neuropathy   . On home O2    qhs  . Osteoporosis   . Rheumatoid arteritis ([place])   . Ulcer    stomach  . Vitamin D deficiency     Past Surgical History:  Procedure Laterality Date  . ABDOMINAL HYSTERECTOMY    . ABDOMINAL SURGERY     removed patial stomach from ulcer  . COLONOSCOPY  04-2001   internal hemorrhoids, panocolonic diverticulosis, and colon polyps   . UPPER GASTROINTESTINAL ENDOSCOPY      Social History   Socioeconomic History  . Marital status: Widowed    Spouse name: Not  on file  . Number of children: 6  . Years of education: Not on file  . Highest education level: Not on file  Occupational History  . Occupation: Retired  Tobacco Use  . Smoking status: Never Smoker  . Smokeless tobacco: Never Used  Vaping Use  . Vaping Use: Never used  Substance and Sexual Activity  . Alcohol use: No  . Drug use: No  . Sexual activity: Not Currently  Other Topics Concern  . Not on file  Social History Narrative   Long term resident of [facility]    Social Determinants of Health   Financial Resource Strain: Not on file  Food Insecurity: Not on file  Transportation Needs: Not on file  Physical Activity: Not on file  Stress: Not on file  Social Connections: Not on file  Intimate Partner Violence: Not on file   Family History  Problem Relation Age of Onset  . Heart disease Father   . Kidney disease Mother        kidney cancer  . Colon cancer Daughter 80      VITAL SIGNS BP (!) 141/67   Pulse 74   Temp 99.2 F (37.3 C)   Resp  20   Ht 5\' 3"  (1.6 m)   Wt 219 lb 6.4 oz (99.5 kg)   SpO2 97%   BMI 38.86 kg/m   Outpatient Encounter Medications as of 02/25/2020  Medication Sig  . acetaminophen (TYLENOL) 650 MG CR tablet Take 650 mg by mouth every 6 (six) hours.  Marland Kitchen apixaban (ELIQUIS) 2.5 MG TABS tablet Take 2.5 mg by mouth 2 (two) times daily.  Marland Kitchen denosumab (PROLIA) 60 MG/ML SOSY injection Inject 60 mg into the skin every 6 (six) months.  . diltiazem (CARTIA XT) 300 MG 24 hr capsule Take 300 mg by mouth daily.  Marland Kitchen donepezil (ARICEPT) 5 MG tablet Take 5 mg by mouth at bedtime.  . DULoxetine (CYMBALTA) 20 MG capsule Take 40 mg by mouth daily.   . Fluticasone-Umeclidin-Vilant (TRELEGY ELLIPTA) 100-62.5-25 MCG/INH AEPB One puff inhalation once a day   . folic acid (FOLVITE) 1 MG tablet Take 5 mg by mouth as directed. Once a day on Sunday  . furosemide (LASIX) 40 MG tablet Take 40 mg by mouth daily.  Marland Kitchen guaiFENesin (MUCINEX) 600 MG 12 hr tablet Take 600 mg by mouth  2 (two) times daily.  . Menthol, Topical Analgesic, (BIOFREEZE) 4 % GEL Apply to both knees  daily as needed  . Menthol, Topical Analgesic, (BIOFREEZE) 4 % GEL Apply to bilateral  shoulders Once A Day PRN  . methotrexate (RHEUMATREX) 2.5 MG tablet Take 12.5 mg by mouth once a week. On Sunday - Caution:Chemotherapy. Protect from light.  . NON FORMULARY Diet Type:  NAS, Regular  Consistent Carbohydrates, Dysphagia 3  . OXYGEN Inhale 3.5 L/min into the lungs continuous.  . pantoprazole (PROTONIX) 40 MG tablet Take 40 mg by mouth 2 (two) times daily.  . phenylephrine-shark liver oil-mineral oil-petrolatum (PREPARATION H) 0.25-3-14-71.9 % rectal ointment Place 1 application rectally 2 (two) times daily as needed for hemorrhoids.  . polyethylene glycol (MIRALAX / GLYCOLAX) packet Take 17 g by mouth daily as needed for moderate constipation.  . potassium chloride SA (K-DUR,KLOR-CON) 20 MEQ tablet Take 40 mEq by mouth 2 (two) times daily. From 01/13/2020-01/16/2020, Start 40 mg by mouth twice a day on 02/05/2020  . pregabalin (LYRICA) 50 MG capsule Take 1 capsule by mouth  Twice daily  . traMADol (ULTRAM) 50 MG tablet Take 1 tablet (50 mg total) by mouth 2 (two) times daily.   No facility-administered encounter medications on file as of 02/25/2020.     SIGNIFICANT DIAGNOSTIC EXAMS  PREVIOUS;   01-07-19: chest x-ray; COPD. Aortic atherosclerosis no acute pathology    01-20-19: DEXA: t score -4.997  NO NEW EXAMS.    LABS REVIEWED PREVIOUS :   03-20-19: wbc 4.8; hgb 13.9; hct 44.9; mcv 95.7 plt 97; glucose 168; bun 21; creat 1.00 ;k+ 3.9; na++ 138; ca 9.5 liver normal albumin 3.2  03-27-19: wbc 6.1; hgb 15.1; hct 48.1; mcv 94.9 plt 121  05-17-19: wbc 5.9; hgb 14.1; hct 45.8; mcv 92. 5 plt 112; glucose 134; bun 18; creat 0.94; k+ 3.9; na++ 140; ca 9.9 liver normal albumin 3.3; chol 130 ldl 68; trig 102; hdl 42; hgb a1c 6.4  08-15-19: urine micro-albumin 5.0  12-05-19: wbc 6.8; hgb 10.6; hct 36.9; mcv 87.9  plt 104; glucose 188; bun 13; creat 1.04; k+ 3.9; na++ 136; ca 9.2 liver normal albumin 2.8 hgb a1c 7.0 urine micro-albumin <3.0 01-13-20: glucose 224; bun 14; creat 1.21; k+ 4.3; n++ 137; ca 9.4 BNP 144.0 01-20-20: glucose 152; bun 14; creat 1.03 ;k+ 4.2; na++  140 ca 9.9  NO NEW LABS.   Review of Systems  Constitutional: Negative for malaise/fatigue.  Respiratory: Negative for cough and shortness of breath.   Cardiovascular: Negative for chest pain, palpitations and leg swelling.  Gastrointestinal: Negative for abdominal pain, constipation and heartburn.  Musculoskeletal: Negative for back pain, joint pain and myalgias.  Skin: Negative.   Neurological: Negative for dizziness.  Psychiatric/Behavioral: The patient is not nervous/anxious.     Physical Exam Constitutional:      General: She is not in acute distress.    Appearance: She is well-developed and well-nourished. She is not diaphoretic.  Neck:     Thyroid: No thyromegaly.  Cardiovascular:     Rate and Rhythm: Normal rate and regular rhythm.     Pulses: Normal pulses and intact distal pulses.     Heart sounds: Normal heart sounds.  Pulmonary:     Effort: Pulmonary effort is normal. No respiratory distress.     Breath sounds: Normal breath sounds.     Comments: 02 Abdominal:     General: Bowel sounds are normal. There is no distension.     Palpations: Abdomen is soft.     Tenderness: There is no abdominal tenderness.  Musculoskeletal:     Cervical back: Neck supple.     Right lower leg: Edema present.     Left lower leg: Edema present.     Comments: 1+ bilateral  Unable to move left upper extremity due to frozen left shoulder Able to move other extremities   Lymphadenopathy:     Cervical: No cervical adenopathy.  Skin:    General: Skin is warm and dry.  Neurological:     Mental Status: She is alert. Mental status is at baseline.  Psychiatric:        Mood and Affect: Mood and affect and mood normal.        ASSESSMENT/ PLAN:  TODAY  1. Rheumatoid arteritis 2. Chronic depression chronic major  Will continue cymbalta 40 mg daily  Will lower her ultram to 25 mg twice daily  Will continue to monitor her response.    MD is aware of resident's narcotic use and is in agreement with current plan of care. We will attempt to wean resident as appropriate.  Ok Edwards NP [facility]  Contact [phone] Monday through Friday 8am- 5pm  After hours call [phone]

## 2020-02-26 ENCOUNTER — Encounter: Payer: Self-pay | Admitting: Adult Health

## 2020-02-26 ENCOUNTER — Non-Acute Institutional Stay (SKILLED_NURSING_FACILITY): Payer: Medicare Other | Admitting: Adult Health

## 2020-02-26 DIAGNOSIS — I5032 Chronic diastolic (congestive) heart failure: Secondary | ICD-10-CM

## 2020-02-26 NOTE — Progress Notes (Signed)
Location:  [place] Room Number: 106/D Place of Service:  SNF (31)   CODE STATUS: DNR  Allergies  Allergen Reactions   Other     Band Aids    Tape    Lipitor [Atorvastatin Calcium] Rash and Other (See Comments)    Muscle pain   Niaspan [Niacin Er] Rash and Other (See Comments)    Muscle pain    Chief Complaint  Patient presents with   Acute Visit    For Edema    HPI:  Her family has expressed concerns over her increased lower extremity edema. Her family has noted this on their last visit. She is taking lasix 40 mg daily. There are no reports of missed doses. She denies any chest pain; no shortness of breath. She is able to lay flat in bed.    Past Medical History:  Diagnosis Date   Anemia    Atrial fibrillation (HCC)    Back pain    Bacterial pneumonia    Cancer ([place])    Skin    Chronic respiratory failure ([place])    Cognitive communication deficit    COPD (chronic obstructive pulmonary disease) (HCC)    Dementia ([place])    Depression    Diabetes mellitus without complication ([place])    Diverticulosis    Fatty liver    Fibromyalgia    Gastric polyp 09/08/11   at the anastomosis-inflammatory   Generalized anxiety disorder    GERD (gastroesophageal reflux disease)    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)    Hypercholesteremia    Hypertension    Insomnia    Internal hemorrhoids    Muscle weakness    Neuropathy    On home O2    qhs   Osteoporosis    Rheumatoid arteritis ([place])    Ulcer    stomach   Vitamin D deficiency     Past Surgical History:  Procedure Laterality Date   ABDOMINAL HYSTERECTOMY     ABDOMINAL SURGERY     removed patial stomach from ulcer   COLONOSCOPY  04-2001   internal hemorrhoids, panocolonic diverticulosis, and colon polyps    UPPER GASTROINTESTINAL ENDOSCOPY      Social History   Socioeconomic History   Marital status: Widowed    Spouse name: Not on file    Number of children: 6   Years of education: Not on file   Highest education level: Not on file  Occupational History   Occupation: Retired  Tobacco Use   Smoking status: Never Smoker   Smokeless tobacco: Never Used  Scientific laboratory technician Use: Never used  Substance and Sexual Activity   Alcohol use: No   Drug use: No   Sexual activity: Not Currently  Other Topics Concern   Not on file  Social History Narrative   Long term resident of [facility]    Social Determinants of Health   Financial Resource Strain: Not on file  Food Insecurity: Not on file  Transportation Needs: Not on file  Physical Activity: Not on file  Stress: Not on file  Social Connections: Not on file  Intimate Partner Violence: Not on file   Family History  Problem Relation Age of Onset   Heart disease Father    Kidney disease Mother        kidney cancer   Colon cancer Daughter 40      VITAL SIGNS BP (!) 142/76    Pulse 60    Temp (!) 97.3 F (36.3 C)  Ht 5\' 3"  (1.6 m)    Wt 219 lb 6.4 oz (99.5 kg)    SpO2 94%    BMI 38.86 kg/m   Outpatient Encounter Medications as of 02/26/2020  Medication Sig   acetaminophen (TYLENOL) 650 MG CR tablet Take 650 mg by mouth every 6 (six) hours.   apixaban (ELIQUIS) 2.5 MG TABS tablet Take 2.5 mg by mouth 2 (two) times daily.   denosumab (PROLIA) 60 MG/ML SOSY injection Inject 60 mg into the skin every 6 (six) months.   diltiazem (CARDIZEM CD) 300 MG 24 hr capsule Take 300 mg by mouth daily.   donepezil (ARICEPT) 5 MG tablet Take 5 mg by mouth at bedtime.   DULoxetine (CYMBALTA) 20 MG capsule Take 40 mg by mouth daily.    Fluticasone-Umeclidin-Vilant (TRELEGY ELLIPTA) 100-62.5-25 MCG/INH AEPB One puff inhalation once a day   folic acid (FOLVITE) 1 MG tablet Take 5 mg by mouth as directed. Once a day on Sunday   furosemide (LASIX) 40 MG tablet Take 40 mg by mouth daily.   guaiFENesin (MUCINEX) 600 MG 12 hr tablet Take 600 mg by mouth 2 (two) times  daily.   Menthol, Topical Analgesic, (BIOFREEZE) 4 % GEL Apply to bilateral  shoulders Once A Day PRN   Menthol, Topical Analgesic, 4 % GEL Apply to both knees  daily as needed   methotrexate (RHEUMATREX) 2.5 MG tablet Take 12.5 mg by mouth once a week. On Sunday - Caution:Chemotherapy. Protect from light.   NON FORMULARY Diet Type:  NAS, Regular  Consistent Carbohydrates, Dysphagia 3   OXYGEN Inhale 3.5 L/min into the lungs continuous.   pantoprazole (PROTONIX) 40 MG tablet Take 40 mg by mouth 2 (two) times daily.   phenylephrine-shark liver oil-mineral oil-petrolatum (PREPARATION H) 0.25-3-14-71.9 % rectal ointment Place 1 application rectally 2 (two) times daily as needed for hemorrhoids.   polyethylene glycol (MIRALAX / GLYCOLAX) packet Take 17 g by mouth daily as needed for moderate constipation.   potassium chloride SA (K-DUR,KLOR-CON) 20 MEQ tablet Take 40 mEq by mouth 2 (two) times daily. From 01/13/2020-01/16/2020, Start 40 mg by mouth twice a day on 02/05/2020   pregabalin (LYRICA) 50 MG capsule Take 1 capsule by mouth  Twice daily   traMADol (ULTRAM) 50 MG tablet Take 0.5 tablets (25 mg total) by mouth 2 (two) times daily.   No facility-administered encounter medications on file as of 02/26/2020.     SIGNIFICANT DIAGNOSTIC EXAMS  PREVIOUS;   01-07-19: chest x-ray; COPD. Aortic atherosclerosis no acute pathology    01-20-19: DEXA: t score -4.997  NO NEW EXAMS.    LABS REVIEWED PREVIOUS :   03-20-19: wbc 4.8; hgb 13.9; hct 44.9; mcv 95.7 plt 97; glucose 168; bun 21; creat 1.00 ;k+ 3.9; na++ 138; ca 9.5 liver normal albumin 3.2  03-27-19: wbc 6.1; hgb 15.1; hct 48.1; mcv 94.9 plt 121  05-17-19: wbc 5.9; hgb 14.1; hct 45.8; mcv 92. 5 plt 112; glucose 134; bun 18; creat 0.94; k+ 3.9; na++ 140; ca 9.9 liver normal albumin 3.3; chol 130 ldl 68; trig 102; hdl 42; hgb a1c 6.4  08-15-19: urine micro-albumin 5.0  12-05-19: wbc 6.8; hgb 10.6; hct 36.9; mcv 87.9 plt 104; glucose 188;  bun 13; creat 1.04; k+ 3.9; na++ 136; ca 9.2 liver normal albumin 2.8 hgb a1c 7.0 urine micro-albumin <3.0 01-13-20: glucose 224; bun 14; creat 1.21; k+ 4.3; na++ 137; ca 9.4 BNP 144.0 01-20-20: glucose 152; bun 14; creat 1.03 ;k+ 4.2; na++ 140 ca  9.9  NO NEW LABS.   Review of Systems  Constitutional: Negative for malaise/fatigue.  Respiratory: Negative for cough and shortness of breath.   Cardiovascular: Positive for leg swelling. Negative for chest pain and palpitations.  Gastrointestinal: Negative for abdominal pain, constipation and heartburn.  Musculoskeletal: Negative for back pain, joint pain and myalgias.  Skin: Negative.   Neurological: Negative for dizziness.  Psychiatric/Behavioral: The patient is not nervous/anxious.      Physical Exam Constitutional:      General: She is not in acute distress.    Appearance: She is well-developed and well-nourished. She is not diaphoretic.  Neck:     Thyroid: No thyromegaly.  Cardiovascular:     Rate and Rhythm: Normal rate and regular rhythm.     Pulses: Normal pulses and intact distal pulses.     Heart sounds: Normal heart sounds.  Pulmonary:     Effort: Pulmonary effort is normal. No respiratory distress.     Breath sounds: Normal breath sounds.     Comments: 02 Abdominal:     General: Bowel sounds are normal. There is no distension.     Palpations: Abdomen is soft.     Tenderness: There is no abdominal tenderness.  Musculoskeletal:     Cervical back: Neck supple.     Right lower leg: Edema present.     Left lower leg: Edema present.     Comments:  2-3+ bilateral lower extremity edema  Unable to move left upper extremity due to frozen left shoulder Able to move other extremities    Lymphadenopathy:     Cervical: No cervical adenopathy.  Skin:    General: Skin is warm and dry.  Neurological:     Mental Status: She is alert and oriented to person, place, and time.  Psychiatric:        Mood and Affect: Mood and affect  normal.       ASSESSMENT/ PLAN:  TODAY  1. Chronic diastolic congestive heart failure  Is worse:   Will increase lasix to 40 mg twice daily  Will check BMP on 03-02-20.  Will monitor her status.    MD is aware of resident's narcotic use and is in agreement with current plan of care. We will attempt to wean resident as appropriate.  Ok Edwards NP [facility]  Contact [phone] Monday through Friday 8am- 5pm  After hours call [phone]

## 2020-03-02 ENCOUNTER — Other Ambulatory Visit (HOSPITAL_COMMUNITY)
Admission: RE | Admit: 2020-03-02 | Discharge: 2020-03-02 | Disposition: A | Discharge status: Home / Self Care | Payer: Medicare Other | Source: Skilled Nursing Facility | Attending: Adult Health | Admitting: Adult Health

## 2020-03-02 DIAGNOSIS — I130 Hypertensive heart and chronic kidney disease with heart failure and stage 1 through stage 4 chronic kidney disease, or unspecified chronic kidney disease: Secondary | ICD-10-CM | POA: Diagnosis not present

## 2020-03-02 LAB — BASIC METABOLIC PANEL
Anion gap: 7 (ref 5–15)
BUN: 20 mg/dL (ref 8–23)
CO2: 30 mmol/L (ref 22–32)
Calcium: 9.6 mg/dL (ref 8.9–10.3)
Chloride: 101 mmol/L (ref 98–111)
Creatinine, Ser: 1.19 mg/dL — ABNORMAL HIGH (ref 0.44–1.00)
GFR, Estimated: 44 mL/min — ABNORMAL LOW (ref >60)
Glucose, Bld: 176 mg/dL — ABNORMAL HIGH (ref 70–99)
Potassium: 4 mmol/L (ref 3.5–5.1)
Sodium: 138 mmol/L (ref 135–145)

## 2020-03-09 ENCOUNTER — Encounter: Payer: Self-pay | Admitting: Adult Health

## 2020-03-09 ENCOUNTER — Non-Acute Institutional Stay (SKILLED_NURSING_FACILITY): Payer: Medicare Other | Admitting: Adult Health

## 2020-03-09 DIAGNOSIS — K5909 Other constipation: Secondary | ICD-10-CM

## 2020-03-09 DIAGNOSIS — E1142 Type 2 diabetes mellitus with diabetic polyneuropathy: Secondary | ICD-10-CM

## 2020-03-09 DIAGNOSIS — M810 Age-related osteoporosis without current pathological fracture: Secondary | ICD-10-CM

## 2020-03-09 NOTE — Progress Notes (Signed)
Location:  [place] Room Number: 106/D Place of Service:  SNF (31)   CODE STATUS: DNR  Allergies  Allergen Reactions  . Other     Band Aids   . Tape   . Lipitor [Atorvastatin Calcium] Rash and Other (See Comments)    Muscle pain  . Niaspan [Niacin Er] Rash and Other (See Comments)    Muscle pain    Chief Complaint  Patient presents with  . Medical Management of Chronic Issues         Post-menopausal osteoporosis  Chronic constipation:  Type 2 diabetes mellitus with peripheral neuropathy      HPI:  She is a 86 year old long term resident of this facility being seen for the management of her chronic illnesses: Post-menopausal osteoporosis  Chronic constipation:  Type 2 diabetes mellitus with peripheral neuropathy. There are no reports of uncontrolled pain; no reports of constipation; no reports of excessive thirst or hunger.   Past Medical History:  Diagnosis Date  . Anemia   . Atrial fibrillation ([place])   . Back pain   . Bacterial pneumonia   . Cancer (HCC)    Skin   . Chronic respiratory failure ([place])   . Cognitive communication deficit   . COPD (chronic obstructive pulmonary disease) ([place])   . Dementia ([place])   . Depression   . Diabetes mellitus without complication ([place])   . Diverticulosis   . Fatty liver   . Fibromyalgia   . Gastric polyp 09/08/11   at the anastomosis-inflammatory  . Generalized anxiety disorder   . GERD (gastroesophageal reflux disease)   . 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 Hypercholesteremia   . Hypertension   . Insomnia   . Internal hemorrhoids   . Muscle weakness   . Neuropathy   . On home O2    qhs  . Osteoporosis   . Rheumatoid arteritis ([place])   . Ulcer    stomach  . Vitamin D deficiency     Past Surgical History:  Procedure Laterality Date  . ABDOMINAL HYSTERECTOMY    . ABDOMINAL SURGERY     removed patial stomach from ulcer  . COLONOSCOPY  04-2001   internal hemorrhoids, panocolonic  diverticulosis, and colon polyps   . UPPER GASTROINTESTINAL ENDOSCOPY      Social History   Socioeconomic History  . Marital status: Widowed    Spouse name: Not on file  . Number of children: 6  . Years of education: Not on file  . Highest education level: Not on file  Occupational History  . Occupation: Retired  Tobacco Use  . Smoking status: Never Smoker  . Smokeless tobacco: Never Used  Vaping Use  . Vaping Use: Never used  Substance and Sexual Activity  . Alcohol use: No  . Drug use: No  . Sexual activity: Not Currently  Other Topics Concern  . Not on file  Social History Narrative   Long term resident of [facility]    Social Determinants of Health   Financial Resource Strain: Not on file  Food Insecurity: Not on file  Transportation Needs: Not on file  Physical Activity: Not on file  Stress: Not on file  Social Connections: Not on file  Intimate Partner Violence: Not on file   Family History  Problem Relation Age of Onset  . Heart disease Father   . Kidney disease Mother        kidney cancer  . Colon cancer Daughter 18  VITAL SIGNS BP (!) 142/76   Pulse (!) 59   Temp 98.2 F (36.8 C)   Resp 20   Ht 5\' 3"  (1.6 m)   Wt 219 lb 6.4 oz (99.5 kg)   SpO2 90%   BMI 38.86 kg/m   Outpatient Encounter Medications as of 03/09/2020  Medication Sig  . acetaminophen (TYLENOL) 650 MG CR tablet Take 650 mg by mouth every 6 (six) hours.  Marland Kitchen apixaban (ELIQUIS) 2.5 MG TABS tablet Take 2.5 mg by mouth 2 (two) times daily.  Marland Kitchen denosumab (PROLIA) 60 MG/ML SOSY injection Inject 60 mg into the skin every 6 (six) months.  . diltiazem (CARDIZEM CD) 300 MG 24 hr capsule Take 300 mg by mouth daily.  Marland Kitchen donepezil (ARICEPT) 5 MG tablet Take 5 mg by mouth at bedtime.  . DULoxetine (CYMBALTA) 20 MG capsule Take 40 mg by mouth daily.   . Fluticasone-Umeclidin-Vilant (TRELEGY ELLIPTA) 100-62.5-25 MCG/INH AEPB One puff inhalation once a day  . folic acid (FOLVITE) 1 MG tablet Take 5  mg by mouth as directed. Once a day on Sunday  . furosemide (LASIX) 40 MG tablet Take 40 mg by mouth daily.  Marland Kitchen guaiFENesin (MUCINEX) 600 MG 12 hr tablet Take 600 mg by mouth 2 (two) times daily.  . Menthol, Topical Analgesic, (BIOFREEZE) 4 % GEL Apply to bilateral  shoulders Once A Day PRN  . Menthol, Topical Analgesic, 4 % GEL Apply to both knees  daily as needed  . methotrexate (RHEUMATREX) 2.5 MG tablet Take 12.5 mg by mouth once a week. On Sunday - Caution:Chemotherapy. Protect from light.  . NON FORMULARY Diet Type:  NAS, Regular  Consistent Carbohydrates, Dysphagia 3  . OXYGEN Inhale 3.5 L/min into the lungs continuous.  . pantoprazole (PROTONIX) 40 MG tablet Take 40 mg by mouth 2 (two) times daily.  . phenylephrine-shark liver oil-mineral oil-petrolatum (PREPARATION H) 0.25-3-14-71.9 % rectal ointment Place 1 application rectally 2 (two) times daily as needed for hemorrhoids.  . polyethylene glycol (MIRALAX / GLYCOLAX) packet Take 17 g by mouth daily as needed for moderate constipation.  . potassium chloride SA (K-DUR,KLOR-CON) 20 MEQ tablet Take 40 mEq by mouth 2 (two) times daily. From 01/13/2020-01/16/2020, Start 40 mg by mouth twice a day on 02/05/2020  . pregabalin (LYRICA) 50 MG capsule Take 1 capsule by mouth  Twice daily  . traMADol (ULTRAM) 50 MG tablet Take 0.5 tablets (25 mg total) by mouth 2 (two) times daily.   No facility-administered encounter medications on file as of 03/09/2020.     SIGNIFICANT DIAGNOSTIC EXAMS   PREVIOUS;   01-07-19: chest x-ray; COPD. Aortic atherosclerosis no acute pathology    01-20-19: DEXA: t score -4.997  NO NEW EXAMS.    LABS REVIEWED PREVIOUS :   03-20-19: wbc 4.8; hgb 13.9; hct 44.9; mcv 95.7 plt 97; glucose 168; bun 21; creat 1.00 ;k+ 3.9; na++ 138; ca 9.5 liver normal albumin 3.2  03-27-19: wbc 6.1; hgb 15.1; hct 48.1; mcv 94.9 plt 121  05-17-19: wbc 5.9; hgb 14.1; hct 45.8; mcv 92. 5 plt 112; glucose 134; bun 18; creat 0.94; k+ 3.9;  na++ 140; ca 9.9 liver normal albumin 3.3; chol 130 ldl 68; trig 102; hdl 42; hgb a1c 6.4  08-15-19: urine micro-albumin 5.0  12-05-19: wbc 6.8; hgb 10.6; hct 36.9; mcv 87.9 plt 104; glucose 188; bun 13; creat 1.04; k+ 3.9; na++ 136; ca 9.2 liver normal albumin 2.8 hgb a1c 7.0 urine micro-albumin <3.0  TODAY  01-13-20: glucose 224;  bun 14; creat 1.21; k+ 4.3; n++ 137; ca 9.4 BNP 144.0 01-20-20: glucose 152; bun 14; creat 1.03 ;k+ 4.2; na++ 140 ca 9.9 03-02-20: glucose bun 20; creat 1.19 ;k+ 4.0; na++ 138; ca 9.6   Review of Systems  Constitutional: Negative for malaise/fatigue.  Respiratory: Negative for cough and shortness of breath.   Cardiovascular: Positive for leg swelling. Negative for chest pain and palpitations.  Gastrointestinal: Negative for abdominal pain, constipation and heartburn.  Musculoskeletal: Negative for back pain, joint pain and myalgias.  Skin: Negative.   Neurological: Negative for dizziness.  Psychiatric/Behavioral: The patient is not nervous/anxious.      Physical Exam Constitutional:      General: She is not in acute distress.    Appearance: She is well-developed and well-nourished. She is not diaphoretic.  Neck:     Thyroid: No thyromegaly.  Cardiovascular:     Rate and Rhythm: Normal rate and regular rhythm.     Pulses: Normal pulses and intact distal pulses.     Heart sounds: Normal heart sounds.  Pulmonary:     Effort: Pulmonary effort is normal. No respiratory distress.     Breath sounds: Normal breath sounds.     Comments: 02 Abdominal:     General: Bowel sounds are normal. There is no distension.     Palpations: Abdomen is soft.     Tenderness: There is no abdominal tenderness.  Musculoskeletal:     Cervical back: Neck supple.     Right lower leg: Edema present.     Left lower leg: Edema present.     Comments: 2-3+ bilateral lower extremity edema  Unable to move left upper extremity due to frozen left shoulder Able to move other extremities      Lymphadenopathy:     Cervical: No cervical adenopathy.  Skin:    General: Skin is warm and dry.  Neurological:     Mental Status: She is alert and oriented to person, place, and time.  Psychiatric:        Mood and Affect: Mood and affect and mood normal.     ASSESSMENT/ PLAN:  TODAY:   1. Post-menopausal osteoporosis is stable t score -4.997 (01-20-19) will continue prolia 60 mg every 6 months  2. Chronic constipation: is stable will continue miralax daily as needed  3. Type 2 diabetes mellitus with peripheral neuropathy is stable hgb a1c 7.0; is off tradjenta is on statin; eliquis.   PREVIOUS   4. Dyslipidemia associated with type 2 diabetes mellitus is stable LDL 68 will stop crestor due to her advanced age.   5. Atrial fibrillation with RVR heart rate is stable will continue cardizem cd 300 mg daily for rate control and eliquis 2.5 mg twice daily   6. Chronic diastolic heart failure EF 55-60% (07-28-16) will continue lasix 40 mg twice daily with k+ 40 meq twice daily   7. Hypokalemia: is stable k+ 4.0 will continue k+ 40 meq twice daily   8. Unspecified COPD/ chronic respiratory failure with hypoxia is stable is 02 dependent will continue trelegy ellipta 100-62.5-25 mcg 1 puff daily   9. GERD without esophagitis: is stable will continue protonix 40 mg twice daily   10. Vascular dementia without behavioral disturbance: is without change weight is 219 pounds; will continue aricept 5 mg daily   11. CKD stage 3 due to type 2 diabetes mellitus: is stable bun 14 creat 1.03   12. Diabetic peripheral neuropathy associated with type 2 diabetes mellitus: is stable will  continue lyrica 50 mg twice daily   13. Fibromyalgia: is stable will continue lyrica 50 mg twice daily and cymbalta 40 mg daily   14. Rheumatoid arteritis: is stable will continue methotrexate 15.5 mg weekly folic acid 5 mg weekly      MD is aware of resident's narcotic use and is in agreement with current plan  of care. We will attempt to wean resident as appropriate.  Ok Edwards NP [facility]  Contact [phone] Monday through Friday 8am- 5pm  After hours call [phone]

## 2020-03-11 DIAGNOSIS — I129 Hypertensive chronic kidney disease with stage 1 through stage 4 chronic kidney disease, or unspecified chronic kidney disease: Secondary | ICD-10-CM | POA: Diagnosis not present

## 2020-03-11 DIAGNOSIS — Z1159 Encounter for screening for other viral diseases: Secondary | ICD-10-CM | POA: Diagnosis not present

## 2020-03-11 DIAGNOSIS — J449 Chronic obstructive pulmonary disease, unspecified: Secondary | ICD-10-CM | POA: Diagnosis not present

## 2020-03-16 DIAGNOSIS — J449 Chronic obstructive pulmonary disease, unspecified: Secondary | ICD-10-CM | POA: Diagnosis not present

## 2020-03-16 DIAGNOSIS — I129 Hypertensive chronic kidney disease with stage 1 through stage 4 chronic kidney disease, or unspecified chronic kidney disease: Secondary | ICD-10-CM | POA: Diagnosis not present

## 2020-03-16 DIAGNOSIS — Z1159 Encounter for screening for other viral diseases: Secondary | ICD-10-CM | POA: Diagnosis not present

## 2020-03-17 DIAGNOSIS — M6281 Muscle weakness (generalized): Secondary | ICD-10-CM | POA: Diagnosis not present

## 2020-03-17 DIAGNOSIS — R279 Unspecified lack of coordination: Secondary | ICD-10-CM | POA: Diagnosis not present

## 2020-03-17 DIAGNOSIS — J449 Chronic obstructive pulmonary disease, unspecified: Secondary | ICD-10-CM | POA: Diagnosis not present

## 2020-03-17 DIAGNOSIS — M810 Age-related osteoporosis without current pathological fracture: Secondary | ICD-10-CM | POA: Diagnosis not present

## 2020-03-18 DIAGNOSIS — J449 Chronic obstructive pulmonary disease, unspecified: Secondary | ICD-10-CM | POA: Diagnosis not present

## 2020-03-18 DIAGNOSIS — M810 Age-related osteoporosis without current pathological fracture: Secondary | ICD-10-CM | POA: Diagnosis not present

## 2020-03-18 DIAGNOSIS — R279 Unspecified lack of coordination: Secondary | ICD-10-CM | POA: Diagnosis not present

## 2020-03-18 DIAGNOSIS — M6281 Muscle weakness (generalized): Secondary | ICD-10-CM | POA: Diagnosis not present

## 2020-03-19 DIAGNOSIS — M6281 Muscle weakness (generalized): Secondary | ICD-10-CM | POA: Diagnosis not present

## 2020-03-19 DIAGNOSIS — R279 Unspecified lack of coordination: Secondary | ICD-10-CM | POA: Diagnosis not present

## 2020-03-19 DIAGNOSIS — J449 Chronic obstructive pulmonary disease, unspecified: Secondary | ICD-10-CM | POA: Diagnosis not present

## 2020-03-19 DIAGNOSIS — M810 Age-related osteoporosis without current pathological fracture: Secondary | ICD-10-CM | POA: Diagnosis not present

## 2020-03-20 DIAGNOSIS — M6281 Muscle weakness (generalized): Secondary | ICD-10-CM | POA: Diagnosis not present

## 2020-03-20 DIAGNOSIS — M810 Age-related osteoporosis without current pathological fracture: Secondary | ICD-10-CM | POA: Diagnosis not present

## 2020-03-20 DIAGNOSIS — J449 Chronic obstructive pulmonary disease, unspecified: Secondary | ICD-10-CM | POA: Diagnosis not present

## 2020-03-20 DIAGNOSIS — R279 Unspecified lack of coordination: Secondary | ICD-10-CM | POA: Diagnosis not present

## 2020-03-23 ENCOUNTER — Other Ambulatory Visit: Payer: Self-pay | Admitting: Adult Health

## 2020-03-23 DIAGNOSIS — M810 Age-related osteoporosis without current pathological fracture: Secondary | ICD-10-CM | POA: Diagnosis not present

## 2020-03-23 DIAGNOSIS — M6281 Muscle weakness (generalized): Secondary | ICD-10-CM | POA: Diagnosis not present

## 2020-03-23 DIAGNOSIS — R279 Unspecified lack of coordination: Secondary | ICD-10-CM | POA: Diagnosis not present

## 2020-03-23 DIAGNOSIS — J449 Chronic obstructive pulmonary disease, unspecified: Secondary | ICD-10-CM | POA: Diagnosis not present

## 2020-03-23 MED ORDER — PREGABALIN 50 MG PO CAPS: ORAL_CAPSULE | ORAL | 0 refills | Status: DC

## 2020-03-23 MED ORDER — TRAMADOL HCL 50 MG PO TABS
25.0000 mg | ORAL_TABLET | Freq: Two times a day (BID) | ORAL | 0 refills | Status: DC
Start: 2020-03-23 — End: 2020-04-21

## 2020-03-24 DIAGNOSIS — J449 Chronic obstructive pulmonary disease, unspecified: Secondary | ICD-10-CM | POA: Diagnosis not present

## 2020-03-24 DIAGNOSIS — R279 Unspecified lack of coordination: Secondary | ICD-10-CM | POA: Diagnosis not present

## 2020-03-24 DIAGNOSIS — M810 Age-related osteoporosis without current pathological fracture: Secondary | ICD-10-CM | POA: Diagnosis not present

## 2020-03-24 DIAGNOSIS — M6281 Muscle weakness (generalized): Secondary | ICD-10-CM | POA: Diagnosis not present

## 2020-03-25 DIAGNOSIS — J449 Chronic obstructive pulmonary disease, unspecified: Secondary | ICD-10-CM | POA: Diagnosis not present

## 2020-03-25 DIAGNOSIS — Z1159 Encounter for screening for other viral diseases: Secondary | ICD-10-CM | POA: Diagnosis not present

## 2020-03-25 DIAGNOSIS — I129 Hypertensive chronic kidney disease with stage 1 through stage 4 chronic kidney disease, or unspecified chronic kidney disease: Secondary | ICD-10-CM | POA: Diagnosis not present

## 2020-03-27 DIAGNOSIS — R279 Unspecified lack of coordination: Secondary | ICD-10-CM | POA: Diagnosis not present

## 2020-03-27 DIAGNOSIS — J449 Chronic obstructive pulmonary disease, unspecified: Secondary | ICD-10-CM | POA: Diagnosis not present

## 2020-03-27 DIAGNOSIS — M810 Age-related osteoporosis without current pathological fracture: Secondary | ICD-10-CM | POA: Diagnosis not present

## 2020-03-27 DIAGNOSIS — M6281 Muscle weakness (generalized): Secondary | ICD-10-CM | POA: Diagnosis not present

## 2020-03-28 ENCOUNTER — Encounter (HOSPITAL_COMMUNITY)
Admission: RE | Admit: 2020-03-28 | Discharge: 2020-03-28 | Disposition: A | Discharge status: Home / Self Care | Payer: Medicare Other | Source: Skilled Nursing Facility | Attending: Internal Medicine | Admitting: Internal Medicine

## 2020-03-28 DIAGNOSIS — J449 Chronic obstructive pulmonary disease, unspecified: Secondary | ICD-10-CM | POA: Insufficient documentation

## 2020-03-28 LAB — URINALYSIS, ROUTINE W REFLEX MICROSCOPIC
Bilirubin Urine: NEGATIVE
Glucose, UA: NEGATIVE mg/dL
Hgb urine dipstick: NEGATIVE
Ketones, ur: NEGATIVE mg/dL
Leukocytes,Ua: NEGATIVE
Nitrite: NEGATIVE
Protein, ur: NEGATIVE mg/dL
Specific Gravity, Urine: 1.016 (ref 1.005–1.030)
pH: 6 (ref 5.0–8.0)

## 2020-03-31 DIAGNOSIS — M6281 Muscle weakness (generalized): Secondary | ICD-10-CM | POA: Diagnosis not present

## 2020-03-31 DIAGNOSIS — J449 Chronic obstructive pulmonary disease, unspecified: Secondary | ICD-10-CM | POA: Diagnosis not present

## 2020-03-31 DIAGNOSIS — R279 Unspecified lack of coordination: Secondary | ICD-10-CM | POA: Diagnosis not present

## 2020-03-31 DIAGNOSIS — M810 Age-related osteoporosis without current pathological fracture: Secondary | ICD-10-CM | POA: Diagnosis not present

## 2020-04-01 DIAGNOSIS — I129 Hypertensive chronic kidney disease with stage 1 through stage 4 chronic kidney disease, or unspecified chronic kidney disease: Secondary | ICD-10-CM | POA: Diagnosis not present

## 2020-04-01 DIAGNOSIS — R279 Unspecified lack of coordination: Secondary | ICD-10-CM | POA: Diagnosis not present

## 2020-04-01 DIAGNOSIS — M810 Age-related osteoporosis without current pathological fracture: Secondary | ICD-10-CM | POA: Diagnosis not present

## 2020-04-01 DIAGNOSIS — J449 Chronic obstructive pulmonary disease, unspecified: Secondary | ICD-10-CM | POA: Diagnosis not present

## 2020-04-01 DIAGNOSIS — M6281 Muscle weakness (generalized): Secondary | ICD-10-CM | POA: Diagnosis not present

## 2020-04-01 DIAGNOSIS — Z1159 Encounter for screening for other viral diseases: Secondary | ICD-10-CM | POA: Diagnosis not present

## 2020-04-02 ENCOUNTER — Non-Acute Institutional Stay (SKILLED_NURSING_FACILITY): Payer: Medicare Other | Admitting: Adult Health

## 2020-04-02 ENCOUNTER — Encounter: Payer: Self-pay | Admitting: Adult Health

## 2020-04-02 DIAGNOSIS — D696 Thrombocytopenia, unspecified: Secondary | ICD-10-CM

## 2020-04-02 DIAGNOSIS — R279 Unspecified lack of coordination: Secondary | ICD-10-CM | POA: Diagnosis not present

## 2020-04-02 DIAGNOSIS — M6281 Muscle weakness (generalized): Secondary | ICD-10-CM | POA: Diagnosis not present

## 2020-04-02 DIAGNOSIS — F339 Major depressive disorder, recurrent, unspecified: Secondary | ICD-10-CM

## 2020-04-02 DIAGNOSIS — F0150 Vascular dementia without behavioral disturbance: Secondary | ICD-10-CM

## 2020-04-02 DIAGNOSIS — J449 Chronic obstructive pulmonary disease, unspecified: Secondary | ICD-10-CM | POA: Diagnosis not present

## 2020-04-02 DIAGNOSIS — M810 Age-related osteoporosis without current pathological fracture: Secondary | ICD-10-CM | POA: Diagnosis not present

## 2020-04-02 NOTE — Progress Notes (Signed)
Location:  [facility] Room Number: 106/D Place of Service:  SNF (31)   CODE STATUS: DNR  Allergies  Allergen Reactions  . Other     Band Aids   . Tape   . Lipitor [Atorvastatin Calcium] Rash and Other (See Comments)    Muscle pain  . Niaspan [Niacin Er] Rash and Other (See Comments)    Muscle pain    Chief Complaint  Patient presents with  . Acute Visit    Care Plan Meeting     HPI:  We have come together for her care plan meeting. BIMS 5/15 mood 0/30. She requires supervision to limited assistance with her adls. She is frequently incontinent of her bladder and bowel. She feeds herself. Is 02 dependent her cbg's are stable. She has had 2 falls first on 11/4 and 11/16 no injuries. She is having hallucinations of her dead spouse which are not scary to her. Her weight is stable at 211 pounds. She appetite is 50-100% of meals. There are no reports of uncontrolled pain. She continues to be followed for her chronic illnesses including: Major depression current chronic Thrombocytopenia  Vascular dementia without behavioral disturbance  Past Medical History:  Diagnosis Date  . Anemia   . Atrial fibrillation ([place])   . Back pain   . Bacterial pneumonia   . Cancer (HCC)    Skin   . Chronic respiratory failure ([place])   . Cognitive communication deficit   . COPD (chronic obstructive pulmonary disease) ([place])   . Dementia ([place])   . Depression   . Diabetes mellitus without complication ([place])   . Diverticulosis   . Fatty liver   . Fibromyalgia   . Gastric polyp 09/08/11   at the anastomosis-inflammatory  . Generalized anxiety disorder   . GERD (gastroesophageal reflux disease)   . 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 Hypercholesteremia   . Hypertension   . Insomnia   . Internal hemorrhoids   . Muscle weakness   . Neuropathy   . On home O2    qhs  . Osteoporosis   . Rheumatoid arteritis ([place])   . Ulcer    stomach  . Vitamin D deficiency      Past Surgical History:  Procedure Laterality Date  . ABDOMINAL HYSTERECTOMY    . ABDOMINAL SURGERY     removed patial stomach from ulcer  . COLONOSCOPY  04-2001   internal hemorrhoids, panocolonic diverticulosis, and colon polyps   . UPPER GASTROINTESTINAL ENDOSCOPY      Social History   Socioeconomic History  . Marital status: Widowed    Spouse name: Not on file  . Number of children: 6  . Years of education: Not on file  . Highest education level: Not on file  Occupational History  . Occupation: Retired  Tobacco Use  . Smoking status: Never Smoker  . Smokeless tobacco: Never Used  Vaping Use  . Vaping Use: Never used  Substance and Sexual Activity  . Alcohol use: No  . Drug use: No  . Sexual activity: Not Currently  Other Topics Concern  . Not on file  Social History Narrative   Long term resident of [facility]    Social Determinants of Health   Financial Resource Strain: Not on file  Food Insecurity: Not on file  Transportation Needs: Not on file  Physical Activity: Not on file  Stress: Not on file  Social Connections: Not on file  Intimate Partner Violence: Not on file   Family  History  Problem Relation Age of Onset  . Heart disease Father   . Kidney disease Mother        kidney cancer  . Colon cancer Daughter 46      VITAL SIGNS BP (!) 142/70   Pulse 72   Temp (!) 96.9 F (36.1 C)   Ht 5\' 3"  (1.6 m)   Wt 211 lb 3.2 oz (95.8 kg)   SpO2 94%   BMI 37.41 kg/m   Outpatient Encounter Medications as of 04/02/2020  Medication Sig  . acetaminophen (TYLENOL) 650 MG CR tablet Take 650 mg by mouth every 6 (six) hours.  Marland Kitchen apixaban (ELIQUIS) 2.5 MG TABS tablet Take 2.5 mg by mouth 2 (two) times daily.  Marland Kitchen denosumab (PROLIA) 60 MG/ML SOSY injection Inject 60 mg into the skin every 6 (six) months.  . diltiazem (CARDIZEM CD) 300 MG 24 hr capsule Take 300 mg by mouth daily.  Marland Kitchen donepezil (ARICEPT) 5 MG tablet Take 5 mg by mouth at bedtime.  . DULoxetine  (CYMBALTA) 20 MG capsule Take 40 mg by mouth daily.   . Fluticasone-Umeclidin-Vilant (TRELEGY ELLIPTA) 100-62.5-25 MCG/INH AEPB One puff inhalation once a day  . folic acid (FOLVITE) 1 MG tablet Take 5 mg by mouth as directed. Once a day on Sunday  . furosemide (LASIX) 40 MG tablet Take 40 mg by mouth daily.  Marland Kitchen guaiFENesin (MUCINEX) 600 MG 12 hr tablet Take 600 mg by mouth 2 (two) times daily.  . Menthol, Topical Analgesic, (BIOFREEZE) 4 % GEL Apply to bilateral  shoulders Once A Day PRN  . Menthol, Topical Analgesic, 4 % GEL Apply to both knees  daily as needed  . methotrexate (RHEUMATREX) 2.5 MG tablet Take 12.5 mg by mouth once a week. On Sunday - Caution:Chemotherapy. Protect from light.  . NON FORMULARY Diet Type:  NAS, Regular  Consistent Carbohydrates, Dysphagia 3  . OXYGEN Inhale 3.5 L/min into the lungs continuous.  . pantoprazole (PROTONIX) 40 MG tablet Take 40 mg by mouth 2 (two) times daily.  . phenylephrine-shark liver oil-mineral oil-petrolatum (PREPARATION H) 0.25-3-14-71.9 % rectal ointment Place 1 application rectally 2 (two) times daily as needed for hemorrhoids.  . polyethylene glycol (MIRALAX / GLYCOLAX) packet Take 17 g by mouth daily as needed for moderate constipation.  . potassium chloride SA (K-DUR,KLOR-CON) 20 MEQ tablet Take 40 mEq by mouth 2 (two) times daily. From 01/13/2020-01/16/2020, Start 40 mg by mouth twice a day on 02/05/2020  . pregabalin (LYRICA) 50 MG capsule Take 1 capsule by mouth  Twice daily  . traMADol (ULTRAM) 50 MG tablet Take 0.5 tablets (25 mg total) by mouth 2 (two) times daily.   No facility-administered encounter medications on file as of 04/02/2020.     SIGNIFICANT DIAGNOSTIC EXAMS  PREVIOUS;   01-07-19: chest x-ray; COPD. Aortic atherosclerosis no acute pathology    01-20-19: DEXA: t score -4.997  NO NEW EXAMS.    LABS REVIEWED PREVIOUS :   03-20-19: wbc 4.8; hgb 13.9; hct 44.9; mcv 95.7 plt 97; glucose 168; bun 21; creat 1.00 ;k+ 3.9;  na++ 138; ca 9.5 liver normal albumin 3.2  03-27-19: wbc 6.1; hgb 15.1; hct 48.1; mcv 94.9 plt 121  05-17-19: wbc 5.9; hgb 14.1; hct 45.8; mcv 92. 5 plt 112; glucose 134; bun 18; creat 0.94; k+ 3.9; na++ 140; ca 9.9 liver normal albumin 3.3; chol 130 ldl 68; trig 102; hdl 42; hgb a1c 6.4  08-15-19: urine micro-albumin 5.0  12-05-19: wbc 6.8; hgb 10.6; hct  36.9; mcv 87.9 plt 104; glucose 188; bun 13; creat 1.04; k+ 3.9; na++ 136; ca 9.2 liver normal albumin 2.8 hgb a1c 7.0 urine micro-albumin <3.0 01-13-20: glucose 224; bun 14; creat 1.21; k+ 4.3; n++ 137; ca 9.4 BNP 144.0 01-20-20: glucose 152; bun 14; creat 1.03 ;k+ 4.2; na++ 140 ca 9.9 03-02-20: glucose bun 20; creat 1.19 ;k+ 4.0; na++ 138; ca 9.6   NO NEW LABS   Review of Systems  Constitutional: Negative for malaise/fatigue.  Respiratory: Negative for cough and shortness of breath.   Cardiovascular: Negative for chest pain, palpitations and leg swelling.  Gastrointestinal: Negative for abdominal pain, constipation and heartburn.  Musculoskeletal: Negative for back pain, joint pain and myalgias.  Skin: Negative.   Neurological: Negative for dizziness.  Psychiatric/Behavioral: The patient is not nervous/anxious.     Physical Exam Constitutional:      General: She is not in acute distress.    Appearance: She is well-developed and well-nourished. She is not diaphoretic.  Neck:     Thyroid: No thyromegaly.  Cardiovascular:     Rate and Rhythm: Normal rate and regular rhythm.     Pulses: Intact distal pulses.     Heart sounds: Normal heart sounds.  Pulmonary:     Effort: Pulmonary effort is normal. No respiratory distress.     Breath sounds: Normal breath sounds.     Comments: 02 Abdominal:     General: Bowel sounds are normal. There is no distension.     Palpations: Abdomen is soft.     Tenderness: There is no abdominal tenderness.  Musculoskeletal:     Right lower leg: Edema present.     Left lower leg: Edema present.      Comments:  2-3+ bilateral lower extremity edema  Unable to move left upper extremity due to frozen left shoulder Able to move other extremities   Lymphadenopathy:     Cervical: No cervical adenopathy.  Skin:    General: Skin is warm and dry.  Neurological:     Mental Status: She is alert. Mental status is at baseline.  Psychiatric:        Mood and Affect: Mood and affect and mood normal.       ASSESSMENT/ PLAN:  TODAY  1. Major depression current chronic 2. Thrombocytopenia 3. Vascular dementia without behavioral disturbance  Will continue current medications Will continue current plan of care Will continue to monitor her status.    MD is aware of resident's narcotic use and is in agreement with current plan of care. We will attempt to wean resident as appropriate.  Ok Edwards NP [facility]  Contact [phone] Monday through Friday 8am- 5pm  After hours call [phone]

## 2020-04-03 DIAGNOSIS — M6281 Muscle weakness (generalized): Secondary | ICD-10-CM | POA: Diagnosis not present

## 2020-04-03 DIAGNOSIS — J449 Chronic obstructive pulmonary disease, unspecified: Secondary | ICD-10-CM | POA: Diagnosis not present

## 2020-04-03 DIAGNOSIS — M810 Age-related osteoporosis without current pathological fracture: Secondary | ICD-10-CM | POA: Diagnosis not present

## 2020-04-03 DIAGNOSIS — Z1159 Encounter for screening for other viral diseases: Secondary | ICD-10-CM | POA: Diagnosis not present

## 2020-04-03 DIAGNOSIS — R279 Unspecified lack of coordination: Secondary | ICD-10-CM | POA: Diagnosis not present

## 2020-04-03 DIAGNOSIS — I129 Hypertensive chronic kidney disease with stage 1 through stage 4 chronic kidney disease, or unspecified chronic kidney disease: Secondary | ICD-10-CM | POA: Diagnosis not present

## 2020-04-04 DIAGNOSIS — R279 Unspecified lack of coordination: Secondary | ICD-10-CM | POA: Diagnosis not present

## 2020-04-04 DIAGNOSIS — J449 Chronic obstructive pulmonary disease, unspecified: Secondary | ICD-10-CM | POA: Diagnosis not present

## 2020-04-04 DIAGNOSIS — M810 Age-related osteoporosis without current pathological fracture: Secondary | ICD-10-CM | POA: Diagnosis not present

## 2020-04-04 DIAGNOSIS — M6281 Muscle weakness (generalized): Secondary | ICD-10-CM | POA: Diagnosis not present

## 2020-04-06 DIAGNOSIS — I129 Hypertensive chronic kidney disease with stage 1 through stage 4 chronic kidney disease, or unspecified chronic kidney disease: Secondary | ICD-10-CM | POA: Diagnosis not present

## 2020-04-06 DIAGNOSIS — Z1159 Encounter for screening for other viral diseases: Secondary | ICD-10-CM | POA: Diagnosis not present

## 2020-04-06 DIAGNOSIS — J449 Chronic obstructive pulmonary disease, unspecified: Secondary | ICD-10-CM | POA: Diagnosis not present

## 2020-04-07 DIAGNOSIS — R279 Unspecified lack of coordination: Secondary | ICD-10-CM | POA: Diagnosis not present

## 2020-04-07 DIAGNOSIS — M810 Age-related osteoporosis without current pathological fracture: Secondary | ICD-10-CM | POA: Diagnosis not present

## 2020-04-07 DIAGNOSIS — M6281 Muscle weakness (generalized): Secondary | ICD-10-CM | POA: Diagnosis not present

## 2020-04-07 DIAGNOSIS — J449 Chronic obstructive pulmonary disease, unspecified: Secondary | ICD-10-CM | POA: Diagnosis not present

## 2020-04-08 DIAGNOSIS — M810 Age-related osteoporosis without current pathological fracture: Secondary | ICD-10-CM | POA: Diagnosis not present

## 2020-04-08 DIAGNOSIS — Z1159 Encounter for screening for other viral diseases: Secondary | ICD-10-CM | POA: Diagnosis not present

## 2020-04-08 DIAGNOSIS — J449 Chronic obstructive pulmonary disease, unspecified: Secondary | ICD-10-CM | POA: Diagnosis not present

## 2020-04-08 DIAGNOSIS — I129 Hypertensive chronic kidney disease with stage 1 through stage 4 chronic kidney disease, or unspecified chronic kidney disease: Secondary | ICD-10-CM | POA: Diagnosis not present

## 2020-04-08 DIAGNOSIS — M6281 Muscle weakness (generalized): Secondary | ICD-10-CM | POA: Diagnosis not present

## 2020-04-08 DIAGNOSIS — R279 Unspecified lack of coordination: Secondary | ICD-10-CM | POA: Diagnosis not present

## 2020-04-09 DIAGNOSIS — J449 Chronic obstructive pulmonary disease, unspecified: Secondary | ICD-10-CM | POA: Diagnosis not present

## 2020-04-09 DIAGNOSIS — M6281 Muscle weakness (generalized): Secondary | ICD-10-CM | POA: Diagnosis not present

## 2020-04-09 DIAGNOSIS — R279 Unspecified lack of coordination: Secondary | ICD-10-CM | POA: Diagnosis not present

## 2020-04-09 DIAGNOSIS — M810 Age-related osteoporosis without current pathological fracture: Secondary | ICD-10-CM | POA: Diagnosis not present

## 2020-04-10 ENCOUNTER — Non-Acute Institutional Stay (SKILLED_NURSING_FACILITY): Payer: Medicare Other | Admitting: Adult Health

## 2020-04-10 ENCOUNTER — Encounter: Payer: Self-pay | Admitting: Adult Health

## 2020-04-10 DIAGNOSIS — J449 Chronic obstructive pulmonary disease, unspecified: Secondary | ICD-10-CM | POA: Diagnosis not present

## 2020-04-10 DIAGNOSIS — I129 Hypertensive chronic kidney disease with stage 1 through stage 4 chronic kidney disease, or unspecified chronic kidney disease: Secondary | ICD-10-CM | POA: Diagnosis not present

## 2020-04-10 DIAGNOSIS — M6281 Muscle weakness (generalized): Secondary | ICD-10-CM | POA: Diagnosis not present

## 2020-04-10 DIAGNOSIS — I4891 Unspecified atrial fibrillation: Secondary | ICD-10-CM

## 2020-04-10 DIAGNOSIS — Z1159 Encounter for screening for other viral diseases: Secondary | ICD-10-CM | POA: Diagnosis not present

## 2020-04-10 DIAGNOSIS — I5032 Chronic diastolic (congestive) heart failure: Secondary | ICD-10-CM | POA: Diagnosis not present

## 2020-04-10 DIAGNOSIS — R279 Unspecified lack of coordination: Secondary | ICD-10-CM | POA: Diagnosis not present

## 2020-04-10 DIAGNOSIS — E1169 Type 2 diabetes mellitus with other specified complication: Secondary | ICD-10-CM

## 2020-04-10 DIAGNOSIS — M810 Age-related osteoporosis without current pathological fracture: Secondary | ICD-10-CM | POA: Diagnosis not present

## 2020-04-10 DIAGNOSIS — E785 Hyperlipidemia, unspecified: Secondary | ICD-10-CM | POA: Diagnosis not present

## 2020-04-10 NOTE — Progress Notes (Signed)
Location:  [facility] Room Number: 106/D Place of Service:  SNF (31)   CODE STATUS: DNR  Allergies  Allergen Reactions  . Other     Band Aids   . Tape   . Lipitor [Atorvastatin Calcium] Rash and Other (See Comments)    Muscle pain  . Niaspan [Niacin Er] Rash and Other (See Comments)    Muscle pain    Chief Complaint  Patient presents with  . Medical Management of Chronic Issues          . Dyslipidemia associated with type 2 diabetes mellitus:  Atrial fibrillation with PJA:[id number] diastolic heart failure      HPI:  She is a 87 year old long term resident of this facility being seen for the management of her chronic illnesses:Dyslipidemia associated with type 2 diabetes mellitus:  Atrial fibrillation with QBH:ALPFXTK diastolic heart failure. She does have lower extremity swelling. She denies any uncontrolled pain.  She denies any changes in her apptite.   Past Medical History:  Diagnosis Date  . Anemia   . Atrial fibrillation ([place])   . Back pain   . Bacterial pneumonia   . Cancer (HCC)    Skin   . Chronic respiratory failure ([place])   . Cognitive communication deficit   . COPD (chronic obstructive pulmonary disease) ([place])   . Dementia ([place])   . Depression   . Diabetes mellitus without complication ([place])   . Diverticulosis   . Fatty liver   . Fibromyalgia   . Gastric polyp 09/08/11   at the anastomosis-inflammatory  . Generalized anxiety disorder   . GERD (gastroesophageal reflux disease)   . 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 Hypercholesteremia   . Hypertension   . Insomnia   . Internal hemorrhoids   . Muscle weakness   . Neuropathy   . On home O2    qhs  . Osteoporosis   . Rheumatoid arteritis ([place])   . Ulcer    stomach  . Vitamin D deficiency     Past Surgical History:  Procedure Laterality Date  . ABDOMINAL HYSTERECTOMY    . ABDOMINAL SURGERY     removed patial stomach from ulcer  . COLONOSCOPY  04-2001   internal  hemorrhoids, panocolonic diverticulosis, and colon polyps   . UPPER GASTROINTESTINAL ENDOSCOPY      Social History   Socioeconomic History  . Marital status: Widowed    Spouse name: Not on file  . Number of children: 6  . Years of education: Not on file  . Highest education level: Not on file  Occupational History  . Occupation: Retired  Tobacco Use  . Smoking status: Never Smoker  . Smokeless tobacco: Never Used  Vaping Use  . Vaping Use: Never used  Substance and Sexual Activity  . Alcohol use: No  . Drug use: No  . Sexual activity: Not Currently  Other Topics Concern  . Not on file  Social History Narrative   Long term resident of [facility]    Social Determinants of Health   Financial Resource Strain: Not on file  Food Insecurity: Not on file  Transportation Needs: Not on file  Physical Activity: Not on file  Stress: Not on file  Social Connections: Not on file  Intimate Partner Violence: Not on file   Family History  Problem Relation Age of Onset  . Heart disease Father   . Kidney disease Mother        kidney cancer  . Colon  cancer Daughter 31      VITAL SIGNS BP (!) 152/86   Pulse 91   Temp (!) 97.5 F (36.4 C)   Ht 5\' 3"  (1.6 m)   Wt 211 lb 3.2 oz (95.8 kg)   SpO2 92%   BMI 37.41 kg/m   Outpatient Encounter Medications as of 04/10/2020  Medication Sig  . acetaminophen (TYLENOL) 650 MG CR tablet Take 650 mg by mouth every 6 (six) hours.  Marland Kitchen apixaban (ELIQUIS) 2.5 MG TABS tablet Take 2.5 mg by mouth 2 (two) times daily.  Marland Kitchen denosumab (PROLIA) 60 MG/ML SOSY injection Inject 60 mg into the skin every 6 (six) months.  . diltiazem (CARDIZEM CD) 300 MG 24 hr capsule Take 300 mg by mouth daily.  Marland Kitchen donepezil (ARICEPT) 5 MG tablet Take 5 mg by mouth at bedtime.  . DULoxetine (CYMBALTA) 20 MG capsule Take 40 mg by mouth daily.   . Fluticasone-Umeclidin-Vilant (TRELEGY ELLIPTA) 100-62.5-25 MCG/INH AEPB One puff inhalation once a day  . folic acid (FOLVITE) 1 MG  tablet Take 5 mg by mouth as directed. Once a day on Sunday  . furosemide (LASIX) 40 MG tablet Take 40 mg by mouth 2 (two) times daily.  Marland Kitchen guaiFENesin (MUCINEX) 600 MG 12 hr tablet Take 600 mg by mouth 2 (two) times daily.  . Menthol, Topical Analgesic, (BIOFREEZE) 4 % GEL Apply to bilateral  shoulders Once A Day PRN  . Menthol, Topical Analgesic, 4 % GEL Apply to both knees  daily as needed  . methotrexate (RHEUMATREX) 2.5 MG tablet Take 12.5 mg by mouth once a week. On Sunday - Caution:Chemotherapy. Protect from light.  . NON FORMULARY Diet Type:  NAS, Regular  Consistent Carbohydrates, Dysphagia 3  . OXYGEN Inhale 3.5 L/min into the lungs continuous.  . pantoprazole (PROTONIX) 40 MG tablet Take 40 mg by mouth 2 (two) times daily.  . phenylephrine-shark liver oil-mineral oil-petrolatum (PREPARATION H) 0.25-3-14-71.9 % rectal ointment Place 1 application rectally 2 (two) times daily as needed for hemorrhoids.  . polyethylene glycol (MIRALAX / GLYCOLAX) packet Take 17 g by mouth daily as needed for moderate constipation.  . potassium chloride SA (K-DUR,KLOR-CON) 20 MEQ tablet Take 40 mEq by mouth 2 (two) times daily. From 01/13/2020-01/16/2020, Start 40 mg by mouth twice a day on 02/05/2020  . pregabalin (LYRICA) 50 MG capsule Take 1 capsule by mouth  Twice daily  . traMADol (ULTRAM) 50 MG tablet Take 0.5 tablets (25 mg total) by mouth 2 (two) times daily.   No facility-administered encounter medications on file as of 04/10/2020.     SIGNIFICANT DIAGNOSTIC EXAMS  PREVIOUS;   01-07-19: chest x-ray; COPD. Aortic atherosclerosis no acute pathology    01-20-19: DEXA: t score -4.997  NO NEW EXAMS.    LABS REVIEWED PREVIOUS :   03-20-19: wbc 4.8; hgb 13.9; hct 44.9; mcv 95.7 plt 97; glucose 168; bun 21; creat 1.00 ;k+ 3.9; na++ 138; ca 9.5 liver normal albumin 3.2  03-27-19: wbc 6.1; hgb 15.1; hct 48.1; mcv 94.9 plt 121  05-17-19: wbc 5.9; hgb 14.1; hct 45.8; mcv 92. 5 plt 112; glucose 134; bun  18; creat 0.94; k+ 3.9; na++ 140; ca 9.9 liver normal albumin 3.3; chol 130 ldl 68; trig 102; hdl 42; hgb a1c 6.4  08-15-19: urine micro-albumin 5.0  12-05-19: wbc 6.8; hgb 10.6; hct 36.9; mcv 87.9 plt 104; glucose 188; bun 13; creat 1.04; k+ 3.9; na++ 136; ca 9.2 liver normal albumin 2.8 hgb a1c 7.0 urine micro-albumin <3.0  01-13-20: glucose 224; bun 14; creat 1.21; k+ 4.3; na++ 137; ca 9.4 BNP 144.0 01-20-20: glucose 152; bun 14; creat 1.03 ;k+ 4.2; na++ 140 ca 9.9 03-02-20: glucose bun 20; creat 1.19 ;k+ 4.0; na++ 138; ca 9.6   Review of Systems  Constitutional: Negative for malaise/fatigue.  Respiratory: Negative for cough and shortness of breath.   Cardiovascular: Negative for chest pain, palpitations and leg swelling.  Gastrointestinal: Negative for abdominal pain, constipation and heartburn.  Musculoskeletal: Negative for back pain, joint pain and myalgias.  Skin: Negative.   Neurological: Negative for dizziness.  Psychiatric/Behavioral: The patient is not nervous/anxious.   \    Physical Exam Constitutional:      General: She is not in acute distress.    Appearance: She is well-developed and well-nourished. She is not diaphoretic.  Neck:     Thyroid: No thyromegaly.  Cardiovascular:     Rate and Rhythm: Normal rate and regular rhythm.     Pulses: Normal pulses and intact distal pulses.     Heart sounds: Normal heart sounds.  Pulmonary:     Effort: Pulmonary effort is normal. No respiratory distress.     Breath sounds: Wheezing present.     Comments: 02 few scattered  Abdominal:     General: Bowel sounds are normal. There is no distension.     Palpations: Abdomen is soft.     Tenderness: There is no abdominal tenderness.  Musculoskeletal:     Cervical back: Normal range of motion.     Right lower leg: Edema present.     Left lower leg: Edema present.     Comments:  2+ bilateral lower extremity edema  Unable to move left upper extremity due to frozen left shoulder Able to  move other extremities    Lymphadenopathy:     Cervical: No cervical adenopathy.  Skin:    General: Skin is warm and dry.  Neurological:     Mental Status: She is alert. Mental status is at baseline.  Psychiatric:        Mood and Affect: Mood and affect and mood normal.       ASSESSMENT/ PLAN:  TODAY:   1. Dyslipidemia associated with type 2 diabetes mellitus: is stable LDL 68 is off crestor will monitor   2. Atrial fibrillation with RVR: is stable will continue cardizem cd 300 mg daily for rate control and eliquis 2.5 mg twice daily   3. Chronic diastolic heart failure EF 55-60% ( 07-28-16) will continue lasix 40 mg twice daily with k+ 40 meq daily will monitor   PREVIOUS   4. Hypokalemia: is stable k+ 4.0 will continue k+ 40 meq twice daily   5. Unspecified COPD/ chronic respiratory failure with hypoxia is stable is 02 dependent will continue trelegy ellipta 100-62.5-25 mcg 1 puff daily    6GERD without esophagitis: is stable will continue protonix 40 mg twice daily   7. Vascular dementia without behavioral disturbance: is without change weight is 219 pounds; will continue aricept 5 mg daily   8. CKD stage 3 due to type 2 diabetes mellitus: is stable bun 14 creat 1.03   9. Diabetic peripheral neuropathy associated with type 2 diabetes mellitus: is stable will continue lyrica 50 mg twice daily   10. Fibromyalgia: is stable will continue lyrica 50 mg twice daily and cymbalta 40 mg daily   11. Rheumatoid arteritis: is stable will continue methotrexate 42.7 mg weekly folic acid 5 mg weekly   12. Post-menopausal osteoporosis is stable  t score -4.997 (01-20-19) will continue prolia 60 mg every 6 months  13. Chronic constipation: is stable will continue miralax daily as needed  14. Type 2 diabetes mellitus with peripheral neuropathy is stable hgb a1c 7.0; is off tradjenta is on statin; eliquis.        MD is aware of resident's narcotic use and is in agreement with  current plan of care. We will attempt to wean resident as appropriate.  Ok Edwards NP [facility]  Contact [phone] Monday through Friday 8am- 5pm  After hours call [phone]

## 2020-04-13 DIAGNOSIS — J449 Chronic obstructive pulmonary disease, unspecified: Secondary | ICD-10-CM | POA: Diagnosis not present

## 2020-04-13 DIAGNOSIS — I129 Hypertensive chronic kidney disease with stage 1 through stage 4 chronic kidney disease, or unspecified chronic kidney disease: Secondary | ICD-10-CM | POA: Diagnosis not present

## 2020-04-13 DIAGNOSIS — Z1159 Encounter for screening for other viral diseases: Secondary | ICD-10-CM | POA: Diagnosis not present

## 2020-04-15 DIAGNOSIS — Z1159 Encounter for screening for other viral diseases: Secondary | ICD-10-CM | POA: Diagnosis not present

## 2020-04-15 DIAGNOSIS — J449 Chronic obstructive pulmonary disease, unspecified: Secondary | ICD-10-CM | POA: Diagnosis not present

## 2020-04-15 DIAGNOSIS — I129 Hypertensive chronic kidney disease with stage 1 through stage 4 chronic kidney disease, or unspecified chronic kidney disease: Secondary | ICD-10-CM | POA: Diagnosis not present

## 2020-04-17 DIAGNOSIS — Z1159 Encounter for screening for other viral diseases: Secondary | ICD-10-CM | POA: Diagnosis not present

## 2020-04-17 DIAGNOSIS — I129 Hypertensive chronic kidney disease with stage 1 through stage 4 chronic kidney disease, or unspecified chronic kidney disease: Secondary | ICD-10-CM | POA: Diagnosis not present

## 2020-04-17 DIAGNOSIS — J449 Chronic obstructive pulmonary disease, unspecified: Secondary | ICD-10-CM | POA: Diagnosis not present

## 2020-04-20 DIAGNOSIS — J449 Chronic obstructive pulmonary disease, unspecified: Secondary | ICD-10-CM | POA: Diagnosis not present

## 2020-04-20 DIAGNOSIS — I129 Hypertensive chronic kidney disease with stage 1 through stage 4 chronic kidney disease, or unspecified chronic kidney disease: Secondary | ICD-10-CM | POA: Diagnosis not present

## 2020-04-20 DIAGNOSIS — Z1159 Encounter for screening for other viral diseases: Secondary | ICD-10-CM | POA: Diagnosis not present

## 2020-04-21 ENCOUNTER — Other Ambulatory Visit: Payer: Self-pay | Admitting: Adult Health

## 2020-04-21 MED ORDER — TRAMADOL HCL 50 MG PO TABS
25.0000 mg | ORAL_TABLET | Freq: Two times a day (BID) | ORAL | 0 refills | Status: DC
Start: 2020-04-21 — End: 2020-05-22

## 2020-04-21 MED ORDER — PREGABALIN 50 MG PO CAPS: ORAL_CAPSULE | ORAL | 0 refills | Status: DC

## 2020-04-22 DIAGNOSIS — Z1159 Encounter for screening for other viral diseases: Secondary | ICD-10-CM | POA: Diagnosis not present

## 2020-04-22 DIAGNOSIS — J449 Chronic obstructive pulmonary disease, unspecified: Secondary | ICD-10-CM | POA: Diagnosis not present

## 2020-04-22 DIAGNOSIS — I129 Hypertensive chronic kidney disease with stage 1 through stage 4 chronic kidney disease, or unspecified chronic kidney disease: Secondary | ICD-10-CM | POA: Diagnosis not present

## 2020-04-24 DIAGNOSIS — J449 Chronic obstructive pulmonary disease, unspecified: Secondary | ICD-10-CM | POA: Diagnosis not present

## 2020-04-24 DIAGNOSIS — I129 Hypertensive chronic kidney disease with stage 1 through stage 4 chronic kidney disease, or unspecified chronic kidney disease: Secondary | ICD-10-CM | POA: Diagnosis not present

## 2020-04-24 DIAGNOSIS — Z1159 Encounter for screening for other viral diseases: Secondary | ICD-10-CM | POA: Diagnosis not present

## 2020-04-27 DIAGNOSIS — I129 Hypertensive chronic kidney disease with stage 1 through stage 4 chronic kidney disease, or unspecified chronic kidney disease: Secondary | ICD-10-CM | POA: Diagnosis not present

## 2020-04-27 DIAGNOSIS — E1151 Type 2 diabetes mellitus with diabetic peripheral angiopathy without gangrene: Secondary | ICD-10-CM | POA: Diagnosis not present

## 2020-04-27 DIAGNOSIS — J449 Chronic obstructive pulmonary disease, unspecified: Secondary | ICD-10-CM | POA: Diagnosis not present

## 2020-04-27 DIAGNOSIS — Z1159 Encounter for screening for other viral diseases: Secondary | ICD-10-CM | POA: Diagnosis not present

## 2020-04-29 DIAGNOSIS — I129 Hypertensive chronic kidney disease with stage 1 through stage 4 chronic kidney disease, or unspecified chronic kidney disease: Secondary | ICD-10-CM | POA: Diagnosis not present

## 2020-04-29 DIAGNOSIS — J449 Chronic obstructive pulmonary disease, unspecified: Secondary | ICD-10-CM | POA: Diagnosis not present

## 2020-04-29 DIAGNOSIS — Z1159 Encounter for screening for other viral diseases: Secondary | ICD-10-CM | POA: Diagnosis not present

## 2020-05-11 ENCOUNTER — Non-Acute Institutional Stay (SKILLED_NURSING_FACILITY): Payer: Medicare Other | Admitting: Adult Health

## 2020-05-11 DIAGNOSIS — I700 Atherosclerosis of aorta: Secondary | ICD-10-CM

## 2020-05-11 DIAGNOSIS — J9611 Chronic respiratory failure with hypoxia: Secondary | ICD-10-CM | POA: Diagnosis not present

## 2020-05-11 DIAGNOSIS — E876 Hypokalemia: Secondary | ICD-10-CM

## 2020-05-11 DIAGNOSIS — J449 Chronic obstructive pulmonary disease, unspecified: Secondary | ICD-10-CM | POA: Diagnosis not present

## 2020-05-11 NOTE — Progress Notes (Signed)
Location:  [facility] Room Number: 161 Place of Service:  SNF (31)   CODE STATUS: dnr  Allergies  Allergen Reactions  . Other     Band Aids   . Tape   . Lipitor [Atorvastatin Calcium] Rash and Other (See Comments)    Muscle pain  . Niaspan [Niacin Er] Rash and Other (See Comments)    Muscle pain    Chief Complaint  Patient presents with  . Medical Management of Chronic Issues         Aortic atherosclerosis    Hypokalemia:  Unspecified COPD/ chronic respiratory failure with hypoxia    HPI:  [name] is a 87 year old long term resident of this facility being seen for the management of her chronic illnesses: Aortic atherosclerosis    Hypokalemia:  Unspecified COPD/ chronic respiratory failure with hypoxia. [name] does have hallucinations states "people taking her children". [name] is easily agitated with staff. There are no reports of uncontrolled pain.    Past Medical History:  Diagnosis Date  . Anemia   . Atrial fibrillation ([place])   . Back pain   . Bacterial pneumonia   . Cancer (HCC)    Skin   . Chronic respiratory failure ([place])   . Cognitive communication deficit   . COPD (chronic obstructive pulmonary disease) ([place])   . Dementia ([place])   . Depression   . Diabetes mellitus without complication ([place])   . Diverticulosis   . Fatty liver   . Fibromyalgia   . Gastric polyp 09/08/11   at the anastomosis-inflammatory  . Generalized anxiety disorder   . GERD (gastroesophageal reflux disease)   . 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 Hypercholesteremia   . Hypertension   . Insomnia   . Internal hemorrhoids   . Muscle weakness   . Neuropathy   . On home O2    qhs  . Osteoporosis   . Rheumatoid arteritis ([place])   . Ulcer    stomach  . Vitamin D deficiency     Past Surgical History:  Procedure Laterality Date  . ABDOMINAL HYSTERECTOMY    . ABDOMINAL SURGERY     removed patial stomach from ulcer  . COLONOSCOPY  04-2001   internal  hemorrhoids, panocolonic diverticulosis, and colon polyps   . UPPER GASTROINTESTINAL ENDOSCOPY      Social History   Socioeconomic History  . Marital status: Widowed    Spouse name: Not on file  . Number of children: 6  . Years of education: Not on file  . Highest education level: Not on file  Occupational History  . Occupation: Retired  Tobacco Use  . Smoking status: Never Smoker  . Smokeless tobacco: Never Used  Vaping Use  . Vaping Use: Never used  Substance and Sexual Activity  . Alcohol use: No  . Drug use: No  . Sexual activity: Not Currently  Other Topics Concern  . Not on file  Social History Narrative   Long term resident of [facility]    Social Determinants of Health   Financial Resource Strain: Not on file  Food Insecurity: Not on file  Transportation Needs: Not on file  Physical Activity: Not on file  Stress: Not on file  Social Connections: Not on file  Intimate Partner Violence: Not on file   Family History  Problem Relation Age of Onset  . Heart disease Father   . Kidney disease Mother        kidney cancer  . Colon  cancer Daughter 52      VITAL SIGNS BP (!) 150/90   Pulse 86   Temp 97.8 F (36.6 C)   Resp 20   Ht 5\' 3"  (1.6 m)   Wt 214 lb 9.6 oz (97.3 kg)   BMI 38.01 kg/m   Outpatient Encounter Medications as of 05/11/2020  Medication Sig  . acetaminophen (TYLENOL) 650 MG CR tablet Take 650 mg by mouth every 6 (six) hours.  [place] Kitchen apixaban (ELIQUIS) 2.5 MG TABS tablet Take 2.5 mg by mouth 2 (two) times daily.  Marland Kitchen denosumab (PROLIA) 60 MG/ML SOSY injection Inject 60 mg into the skin every 6 (six) months.  . diltiazem (CARDIZEM CD) 300 MG 24 hr capsule Take 300 mg by mouth daily.  Marland Kitchen donepezil (ARICEPT) 5 MG tablet Take 5 mg by mouth at bedtime.  . DULoxetine (CYMBALTA) 20 MG capsule Take 40 mg by mouth daily.   . Fluticasone-Umeclidin-Vilant (TRELEGY ELLIPTA) 100-62.5-25 MCG/INH AEPB One puff inhalation once a day  . folic acid (FOLVITE) 1 MG tablet  Take 5 mg by mouth as directed. Once a day on Sunday  . furosemide (LASIX) 40 MG tablet Take 40 mg by mouth 2 (two) times daily.  Mar[place] Kitchen guaiFENesin (MUCINEX) 600 MG 12 hr tablet Take 600 mg by mouth 2 (two) times daily.  . Menthol, Topical Analgesic, (BIOFREEZE) 4 % GEL Apply to bilateral  shoulders Once A Day PRN  . Menthol, Topical Analgesic, 4 % GEL Apply to both knees  daily as needed  . methotrexate (RHEUMATREX) 2.5 MG tablet Take 12.5 mg by mouth once a week. On Sunday - Caution:Chemotherapy. Protect from light.  . NON FORMULARY Diet Type:  NAS, Regular  Consistent Carbohydrates, Dysphagia 3  . OXYGEN Inhale 3.5 L/min into the lungs continuous.  . pantoprazole (PROTONIX) 40 MG tablet Take 40 mg by mouth 2 (two) times daily.  . phenylephrine-shark liver oil-mineral oil-petrolatum (PREPARATION H) 0.25-3-14-71.9 % rectal ointment Place 1 application rectally 2 (two) times daily as needed for hemorrhoids.  . polyethylene glycol (MIRALAX / GLYCOLAX) packet Take 17 g by mouth daily as needed for moderate constipation.  . potassium chloride SA (K-DUR,KLOR-CON) 20 MEQ tablet Take 40 mEq by mouth 2 (two) times daily. From 01/13/2020-01/16/2020, Start 40 mg by mouth twice a day on 02/05/2020  . pregabalin (LYRICA) 50 MG capsule Take 1 capsule by mouth  Twice daily  . traMADol (ULTRAM) 50 MG tablet Take 0.5 tablets (25 mg total) by mouth 2 (two) times daily.   No facility-administered encounter medications on file as of 05/11/2020.     SIGNIFICANT DIAGNOSTIC EXAMS  PREVIOUS;   01-07-19: chest x-ray; COPD. Aortic atherosclerosis no acute pathology    01-20-19: DEXA: t score -4.997  NO NEW EXAMS.    LABS REVIEWED PREVIOUS :   05-17-19: wbc 5.9; hgb 14.1; hct 45.8; mcv 92. 5 plt 112; glucose 134; bun 18; creat 0.94; k+ 3.9; na++ 140; ca 9.9 liver normal albumin 3.3; chol 130 ldl 68; trig 102; hdl 42; hgb a1c 6.4  08-15-19: urine micro-albumin 5.0  12-05-19: wbc 6.8; hgb 10.6; hct 36.9; mcv 87.9 plt 104;  glucose 188; bun 13; creat 1.04; k+ 3.9; na++ 136; ca 9.2 liver normal albumin 2.8 hgb a1c 7.0 urine micro-albumin <3.0 01-13-20: glucose 224; bun 14; creat 1.21; k+ 4.3; na++ 137; ca 9.4 BNP 144.0 01-20-20: glucose 152; bun 14; creat 1.03 ;k+ 4.2; na++ 140 ca 9.9 03-02-20: glucose bun 20; creat 1.19 ;k+ 4.0; na++ 138; ca 9.6  NO NEW LABS.    Review of Systems  Constitutional: Negative for malaise/fatigue.  Respiratory: Negative for cough and shortness of breath.   Cardiovascular: Negative for chest pain, palpitations and leg swelling.  Gastrointestinal: Negative for abdominal pain, constipation and heartburn.  Musculoskeletal: Negative for back pain, joint pain and myalgias.  Skin: Negative.   Neurological: Negative for dizziness.  Psychiatric/Behavioral: The patient is not nervous/anxious.     Physical Exam Constitutional:      General: [name] is not in acute distress.    Appearance: [name] is well-developed and well-nourished. [name] is not diaphoretic.  Neck:     Thyroid: No thyromegaly.  Cardiovascular:     Rate and Rhythm: Normal rate and regular rhythm.     Pulses: Normal pulses and intact distal pulses.     Heart sounds: Normal heart sounds.  Pulmonary:     Effort: Pulmonary effort is normal. No respiratory distress.     Breath sounds: Wheezing present.     Comments: 02 dependent Few wheezes present  Abdominal:     General: Bowel sounds are normal. There is no distension.     Palpations: Abdomen is soft.     Tenderness: There is no abdominal tenderness.  Musculoskeletal:     Cervical back: Neck supple.     Right lower leg: Edema present.     Left lower leg: Edema present.     Comments: 2+ bilateral lower extremity edema  Unable to move left upper extremity due to frozen left shoulder Able to move other extremities     Lymphadenopathy:     Cervical: No cervical adenopathy.  Skin:    General: Skin is warm and dry.  Neurological:     Mental Status: [name] is alert. Mental  status is at baseline.  Psychiatric:        Mood and Affect: Mood and affect and mood normal.     ASSESSMENT/ PLAN:  TODAY:   1. Aortic atherosclerosis ( cxr 03-09-19) will monitor   2. Hypokalemia: is stable k+ 4.0 will continue k+ 40 meq twice daily   3. Unspecified COPD/ chronic respiratory failure with hypoxia is stable is 02 dependent will continue trelegy ellipta 100-62.5-25 mcg 1 puff daily   PREVIOUS   4. GERD without esophagitis: is stable will continue protonix 40 mg twice daily   5. Vascular dementia without behavioral disturbance: is without change weight is 214 pounds; will continue aricept 5 mg daily   6. CKD stage 3 due to type 2 diabetes mellitus: is stable bun 14 creat 1.03   7. Diabetic peripheral neuropathy associated with type 2 diabetes mellitus: is stable will continue lyrica 50 mg twice daily   8. Fibromyalgia: is stable will continue lyrica 50 mg twice daily and cymbalta 40 mg daily   9. Rheumatoid arteritis: is stable will continue methotrexate 92.4 mg weekly folic acid 5 mg weekly   10. Post-menopausal osteoporosis is stable t score -4.997 (01-20-19) will continue prolia 60 mg every 6 months  11. Chronic constipation: is stable will continue miralax daily as needed  12. Type 2 diabetes mellitus with peripheral neuropathy is stable hgb a1c 7.0; is off tradjenta is on statin; eliquis.   13. Dyslipidemia associated with type 2 diabetes mellitus: is stable LDL 68 is off crestor will monitor   14. Atrial fibrillation with RVR: is stable will continue cardizem cd 300 mg daily for rate control and eliquis 2.5 mg twice daily   15. Chronic diastolic heart failure EF 55-60% (  07-28-16) will continue lasix 40 mg twice daily with k+ 40 meq daily will monitor    MD is aware of resident's narcotic use and is in agreement with current plan of care. We will attempt to wean resident as appropriate.  Ok Edwards NP [facility]  Contact [phone]  Monday through Friday 8am- 5pm  After hours call [phone]

## 2020-05-12 ENCOUNTER — Other Ambulatory Visit (HOSPITAL_COMMUNITY)
Admission: RE | Admit: 2020-05-12 | Discharge: 2020-05-12 | Disposition: A | Discharge status: Home / Self Care | Payer: Medicare Other | Source: Skilled Nursing Facility | Attending: Adult Health | Admitting: Adult Health

## 2020-05-12 DIAGNOSIS — I130 Hypertensive heart and chronic kidney disease with heart failure and stage 1 through stage 4 chronic kidney disease, or unspecified chronic kidney disease: Secondary | ICD-10-CM | POA: Insufficient documentation

## 2020-05-12 LAB — BASIC METABOLIC PANEL
Anion gap: 11 (ref 5–15)
BUN: 19 mg/dL (ref 8–23)
CO2: 28 mmol/L (ref 22–32)
Calcium: 9.8 mg/dL (ref 8.9–10.3)
Chloride: 103 mmol/L (ref 98–111)
Creatinine, Ser: 1.19 mg/dL — ABNORMAL HIGH (ref 0.44–1.00)
GFR, Estimated: 44 mL/min — ABNORMAL LOW (ref >60)
Glucose, Bld: 185 mg/dL — ABNORMAL HIGH (ref 70–99)
Potassium: 3.7 mmol/L (ref 3.5–5.1)
Sodium: 142 mmol/L (ref 135–145)

## 2020-05-12 LAB — CBC WITH DIFFERENTIAL/PLATELET
Abs Immature Granulocytes: 0.01 10*3/uL (ref 0.00–0.07)
Basophils Absolute: 0 10*3/uL (ref 0.0–0.1)
Basophils Relative: 1 %
Eosinophils Absolute: 0.2 10*3/uL (ref 0.0–0.5)
Eosinophils Relative: 5 %
HCT: 35.5 % — ABNORMAL LOW (ref 36.0–46.0)
Hemoglobin: 9.9 g/dL — ABNORMAL LOW (ref 12.0–15.0)
Immature Granulocytes: 0 %
Lymphocytes Relative: 32 %
Lymphs Abs: 1.5 10*3/uL (ref 0.7–4.0)
MCH: 23.7 pg — ABNORMAL LOW (ref 26.0–34.0)
MCHC: 27.9 g/dL — ABNORMAL LOW (ref 30.0–36.0)
MCV: 85.1 fL (ref 80.0–100.0)
Monocytes Absolute: 0.5 10*3/uL (ref 0.1–1.0)
Monocytes Relative: 11 %
Neutro Abs: 2.4 10*3/uL (ref 1.7–7.7)
Neutrophils Relative %: 51 %
Platelets: 102 10*3/uL — ABNORMAL LOW (ref 150–400)
RBC: 4.17 MIL/uL (ref 3.87–5.11)
RDW: 21.2 % — ABNORMAL HIGH (ref 11.5–15.5)
WBC: 4.6 10*3/uL (ref 4.0–10.5)
nRBC: 0 % (ref 0.0–0.2)

## 2020-05-12 LAB — VITAMIN B12: Vitamin B-12: 388 pg/mL (ref 180–914)

## 2020-05-12 LAB — FOLATE: Folate: 20.3 ng/mL (ref >5.9)

## 2020-05-13 DIAGNOSIS — H02824 Cysts of left upper eyelid: Secondary | ICD-10-CM | POA: Diagnosis not present

## 2020-05-13 DIAGNOSIS — I700 Atherosclerosis of aorta: Secondary | ICD-10-CM | POA: Insufficient documentation

## 2020-05-13 DIAGNOSIS — H02825 Cysts of left lower eyelid: Secondary | ICD-10-CM | POA: Diagnosis not present

## 2020-05-13 DIAGNOSIS — E113293 Type 2 diabetes mellitus with mild nonproliferative diabetic retinopathy without macular edema, bilateral: Secondary | ICD-10-CM | POA: Diagnosis not present

## 2020-05-13 DIAGNOSIS — H04123 Dry eye syndrome of bilateral lacrimal glands: Secondary | ICD-10-CM | POA: Diagnosis not present

## 2020-05-14 ENCOUNTER — Other Ambulatory Visit: Payer: Self-pay | Admitting: Adult Health

## 2020-05-14 MED ORDER — PREGABALIN 50 MG PO CAPS: ORAL_CAPSULE | ORAL | 0 refills | Status: DC

## 2020-05-22 ENCOUNTER — Other Ambulatory Visit: Payer: Self-pay | Admitting: Adult Health

## 2020-05-22 MED ORDER — TRAMADOL HCL 50 MG PO TABS: 25.0000 mg | ORAL_TABLET | Freq: Two times a day (BID) | ORAL | 0 refills | Status: DC

## 2020-05-26 ENCOUNTER — Non-Acute Institutional Stay (SKILLED_NURSING_FACILITY): Payer: Medicare Other | Admitting: Adult Health

## 2020-05-26 ENCOUNTER — Encounter: Payer: Self-pay | Admitting: Adult Health

## 2020-05-26 DIAGNOSIS — Z0000 Encounter for general adult medical examination without abnormal findings: Secondary | ICD-10-CM

## 2020-05-26 NOTE — Progress Notes (Signed)
Subjective:   [name] is a 87 y.o. female who presents for Medicare Annual (Subsequent) preventive examination.   Review of Systems  Constitutional: Negative for malaise/fatigue.  Respiratory: Negative for cough and shortness of breath.   Cardiovascular: Negative for chest pain, palpitations and leg swelling.  Gastrointestinal: Negative for abdominal pain, constipation and heartburn.  Musculoskeletal: Negative for back pain, joint pain and myalgias.  Skin: Negative.   Neurological: Negative for dizziness.  Psychiatric/Behavioral: The patient is not nervous/anxious.     Cardiac Risk Factors include: diabetes mellitus;advanced age (>3men, >22 women);obesity (BMI >30kg/m2);sedentary lifestyle     Objective:    Today's Vitals   05/26/20 0935 05/26/20 1241  BP: (!) 148/71   Pulse: 76   Resp: 20   Temp: (!) 97.5 F (36.4 C)   SpO2: 98%   Weight: 218 lb 3.2 oz (99 kg)   Height: 5\' 3"  (1.6 m)   PainSc:  0-No pain   Body mass index is 38.65 kg/m.  Advanced Directives 05/26/2020 04/10/2020 04/02/2020 03/09/2020 02/26/2020 02/25/2020 02/03/2020  Does Patient Have a Medical Advance Directive? Yes Yes Yes Yes Yes Yes Yes  Type of Advance Directive Living will;Out of facility DNR (pink MOST or yellow form) Living will;Out of facility DNR (pink MOST or yellow form) Living will;Out of facility DNR (pink MOST or yellow form) Living will;Out of facility DNR (pink MOST or yellow form) Living will;Out of facility DNR (pink MOST or yellow form) Living will;Out of facility DNR (pink MOST or yellow form) [place];Living will;Out of facility DNR (pink MOST or yellow form)  Does patient want to make changes to medical advance directive? No - Patient declined No - Patient declined No - Patient declined No - Patient declined No - Patient declined No - Patient declined No - Patient declined  Copy of [place] in Chart? - - - - - - Yes - validated most  recent copy scanned in chart (See row information)  Would patient like information on creating a medical advance directive? Yes (Inpatient - patient defers creating a medical advance directive at this time - Information given) - - - - - -  Pre-existing out of facility DNR order (yellow form or pink MOST form) - Yellow form placed in chart (order not valid for inpatient use) Yellow form placed in chart (order not valid for inpatient use) Yellow form placed in chart (order not valid for inpatient use) Yellow form placed in chart (order not valid for inpatient use) Yellow form placed in chart (order not valid for inpatient use) Yellow form placed in chart (order not valid for inpatient use)    Current Medications (verified) Outpatient Encounter Medications as of 05/26/2020  Medication Sig   acetaminophen (TYLENOL) 650 MG CR tablet Take 650 mg by mouth every 6 (six) hours.   apixaban (ELIQUIS) 2.5 MG TABS tablet Take 2.5 mg by mouth 2 (two) times daily.   denosumab (PROLIA) 60 MG/ML SOSY injection Inject 60 mg into the skin every 6 (six) months.   diltiazem (CARDIZEM CD) 300 MG 24 hr capsule Take 300 mg by mouth daily.   donepezil (ARICEPT) 5 MG tablet Take 5 mg by mouth at bedtime.   DULoxetine (CYMBALTA) 20 MG capsule Take 40 mg by mouth daily.    Fluticasone-Umeclidin-Vilant (TRELEGY ELLIPTA) 100-62.5-25 MCG/INH AEPB One puff inhalation once a day   folic acid (FOLVITE) 1 MG tablet Take 5 mg by mouth as directed. Once a day on Sunday  furosemide (LASIX) 40 MG tablet Take 40 mg by mouth 2 (two) times daily.   guaiFENesin (MUCINEX) 600 MG 12 hr tablet Take 600 mg by mouth 2 (two) times daily.   Menthol, Topical Analgesic, (BIOFREEZE) 4 % GEL Apply to bilateral  shoulders Once A Day PRN   Menthol, Topical Analgesic, 4 % GEL Apply to both knees  daily as needed   methotrexate (RHEUMATREX) 2.5 MG tablet Take 12.5 mg by mouth once a week. On Sunday - Caution:Chemotherapy. Protect from  light.   NON FORMULARY Diet Type:  NAS,  Consistent Carbohydrates, Dysphagia 3   OXYGEN Inhale 3.5 L/min into the lungs continuous.   pantoprazole (PROTONIX) 40 MG tablet Take 40 mg by mouth 2 (two) times daily.   phenylephrine-shark liver oil-mineral oil-petrolatum (PREPARATION H) 0.25-3-14-71.9 % rectal ointment Place 1 application rectally 2 (two) times daily as needed for hemorrhoids.   polyethylene glycol (MIRALAX / GLYCOLAX) packet Take 17 g by mouth daily as needed for moderate constipation.   potassium chloride SA (K-DUR,KLOR-CON) 20 MEQ tablet Take 40 mEq by mouth 2 (two) times daily. From 01/13/2020-01/16/2020, Start 40 mg by mouth twice a day on 02/05/2020   pregabalin (LYRICA) 50 MG capsule Take 1 capsule by mouth  Twice daily   traMADol (ULTRAM) 50 MG tablet Take 0.5 tablets (25 mg total) by mouth 2 (two) times daily.   vitamin B-12 (CYANOCOBALAMIN) 1000 MCG tablet Take 1,000 mcg by mouth daily. 8 am for b12 level of 388   No facility-administered encounter medications on file as of 05/26/2020.    Allergies (verified) Other, Tape, Lipitor [atorvastatin calcium], and Niaspan [niacin er]   History: Past Medical History:  Diagnosis Date   Anemia    Atrial fibrillation ([place])    Back pain    Bacterial pneumonia    Cancer ([place])    Skin    Chronic respiratory failure ([place])    Cognitive communication deficit    COPD (chronic obstructive pulmonary disease) (HCC)    Dementia ([place])    Depression    Diabetes mellitus without complication ([place])    Diverticulosis    Fatty liver    Fibromyalgia    Gastric polyp 09/08/11   at the anastomosis-inflammatory   Generalized anxiety disorder    GERD (gastroesophageal reflux disease)    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)    Hypercholesteremia    Hypertension    Insomnia    Internal hemorrhoids    Muscle weakness    Neuropathy    On home O2    qhs   Osteoporosis    Rheumatoid arteritis ([place])     Ulcer    stomach   Vitamin D deficiency    Past Surgical History:  Procedure Laterality Date   ABDOMINAL HYSTERECTOMY     ABDOMINAL SURGERY     removed patial stomach from ulcer   COLONOSCOPY  04-2001   internal hemorrhoids, panocolonic diverticulosis, and colon polyps    UPPER GASTROINTESTINAL ENDOSCOPY     Family History  Problem Relation Age of Onset   Heart disease Father    Kidney disease Mother        kidney cancer   Colon cancer Daughter 45   Social History   Socioeconomic History   Marital status: Widowed    Spouse name: Not on file   Number of children: 6   Years of education: Not on file   Highest education level: Not on file  Occupational History   Occupation: Retired  Tobacco Use  Smoking status: Never Smoker   Smokeless tobacco: Never Used  Vaping Use   Vaping Use: Never used  Substance and Sexual Activity   Alcohol use: No   Drug use: No   Sexual activity: Not Currently  Other Topics Concern   Not on file  Social History Narrative   Long term resident of [facility]    Social Determinants of Health   Financial Resource Strain: Not on file  Food Insecurity: Not on file  Transportation Needs: Not on file  Physical Activity: Not on file  Stress: Not on file  Social Connections: Not on file    Tobacco Counseling Counseling given: Not Answered   Clinical Intake:  Pre-visit preparation completed: Yes  Pain : No/denies pain Pain Score: 0-No pain     BMI - recorded: 38.65 Nutritional Status: BMI > 30  Obese Diabetes: Yes CBG done?: Yes CBG resulted in Enter/ Edit results?: No Did pt. bring in CBG monitor from home?: No  How often do you need to have someone help you when you read instructions, pamphlets, or other written materials from your doctor or pharmacy?: 5 - Always  Diabetic?yes   Interpreter Needed?: No      Activities of Daily Living In your present state of health, do you have any difficulty performing  the following activities: 05/26/2020 05/27/2019  Hearing? Tempie Donning  Vision? N N  Difficulty concentrating or making decisions? Tempie Donning  Walking or climbing stairs? Y Y  Dressing or bathing? Y Y  Doing errands, shopping? Tempie Donning  Preparing Food and eating ? Y Y  Using the Toilet? Y Y  In the past six months, have you accidently leaked urine? Y Y  Do you have problems with loss of bowel control? Y Y  Managing your Medications? Y Y  Managing your Finances? Tempie Donning  Housekeeping or managing your Housekeeping? Y -  Some recent data might be hidden    Patient Care Team: Gerlene Fee, NP as PCP - General ([place], [place] ([place])  Indicate any recent Medical Services you may have received from other than [facility] providers in the past year (date may be approximate).     Assessment:   This is a routine wellness examination for Areej.  Hearing/Vision screen  Hearing Screening   125Hz  250Hz  500Hz  1000Hz  2000Hz  3000Hz  4000Hz  6000Hz  8000Hz   Right ear:           Left ear:           Comments: Wears hearing aids in the am and out at bedtime    Dietary issues and exercise activities discussed: Current Exercise Habits: The patient does not participate in regular exercise at present  Goals     Absence of Fall and Fall-Related Injury     Evidence-based guidance:   Assess fall risk using a validated tool when available. Consider balance and gait impairment, muscle weakness, diminished vision or hearing, environmental hazards, presence of urinary or bowel urgency and/or incontinence.   Communicate fall injury risk to interprofessional healthcare team.   Develop a fall prevention plan with the patient and family.   Promote use of personal vision and auditory aids.   Promote reorientation, appropriate sensory stimulation, and routines to decrease risk of fall when changes in mental status are present.   Assess assistance level required for safe and effective  self-care; consider referral for home care.   Encourage physical activity, such as performance of self-care at highest level of ability,  strength and balance exercise program, and provision of appropriate assistive devices; refer to rehabilitation therapy.   Refer to community-based fall prevention program where available.   If fall occurs, determine the cause and revise fall injury prevention plan.   Regularly review medication contribution to fall risk; consider risk related to polypharmacy and age.   Refer to pharmacist for consultation when concerns about medications are revealed.   Balance adequate pain management with potential for oversedation.   Provide guidance related to environmental modifications.   Consider supplementation with Vitamin D.   Notes:      Follow up with Provider as scheduled     General - Client will not be readmitted within 30 days (C-[facility])      Depression Screen PHQ 2/9 Scores 05/26/2020 05/27/2019 02/01/2019 08/15/2017  PHQ - 2 Score 0 0 1 0    Fall Risk Fall Risk  05/26/2020 05/27/2019 02/01/2019 10/10/2018 08/15/2017  Falls in the past year? 1 0 0 (No Data) No  Comment - - - Emmi Telephone Survey: data to providers prior to load -  Number falls in past yr: 0 0 0 (No Data) -  Comment - - - Emmi Telephone Survey Actual Response =  -  Injury with Fall? 0 - - - -  Follow up Falls evaluation completed - - - -    FALL RISK PREVENTION PERTAINING TO THE HOME:  Any stairs in or around the home? no If so, are there any without handrails?n/a Home free of loose throw rugs in walkways, pet beds, electrical cords, etc? yes Adequate lighting in your home to reduce risk of falls? Yes  ASSISTIVE DEVICES UTILIZED TO PREVENT FALLS:  Life alert?n/a Use of a cane, walker or w/c?yes Grab bars in the bathroom? Yes  Shower chair or bench in shower? Yes Elevated toilet seat or a handicapped toilet? Yes   TIMED UP AND GO:  Was the test performed? no  Is wheelchair  bound .    Cognitive Function:     6CIT Screen 05/26/2020 05/27/2019 08/15/2017  What Year? 0 points 4 points 4 points  What month? 0 points 3 points 3 points  What time? 3 points 0 points 0 points  Count back from 20 2 points 4 points 0 points  Months in reverse 2 points 4 points 4 points  Repeat phrase 4 points 6 points 6 points  Total Score 11 21 17     Immunizations Immunization History  Administered Date(s) Administered   Influenza-Unspecified 12/17/2018, 12/20/2019   Moderna Sars-Covid-2 Vaccination 06/13/2019, 07/10/2019, 01/16/2020   Pneumococcal Conjugate-13 12/17/2018   Tdap 10/21/2016   Vaccines per facility   Screening Tests Health Maintenance  Topic Date Due   HEMOGLOBIN A1C  06/03/2020   OPHTHALMOLOGY EXAM  10/15/2020   FOOT EXAM  11/07/2020   URINE MICROALBUMIN  12/04/2020   TETANUS/TDAP  10/22/2026   INFLUENZA VACCINE  Completed   COVID-19 Vaccine  Completed   HPV VACCINES  Aged Out   DEXA SCAN  Discontinued   PNA vac Low Risk Adult  Discontinued    Health Maintenance  There are no preventive care reminders to display for this patient.   Lung Cancer Screening: (Low Dose CT Chest recommended if Age 68-80 years, 30 pack-year currently smoking OR have quit w/in 15years.) does not  qualify.   Lung Cancer Screening Referral: no  Additional Screening:    Vision Screening: Recommended annual ophthalmology exams for early detection of glaucoma and other disorders of the eye. Is the  patient up to date with their annual eye exam?  yes Who is the provider or what is the name of the office in which the patient attends annual eye exams?  Per facility  If pt is not established with a provider, would they like to be referred to a provider to establish care?  Dental Screening: Recommended annual dental exams for proper oral hygiene  Community Resource Referral / Chronic Care Management: CRR required this visit? no   CCM required this visit?  No       Plan:     I have personally reviewed and noted the following in the patients chart:    Medical and social history  Use of alcohol, tobacco or illicit drugs   Current medications and supplements  Functional ability and status  Nutritional status  Physical activity  Advanced directives  List of other physicians  Hospitalizations, surgeries, and ER visits in previous 12 months  Vitals  Screenings to include cognitive, depression, and falls  Referrals and appointments  In addition, I have reviewed and discussed with patient certain preventive protocols, quality metrics, and best practice recommendations. A written personalized care plan for preventive services as well as general preventive health recommendations were provided to patient.     Gerlene Fee, NP   05/26/2020

## 2020-05-26 NOTE — Patient Instructions (Signed)
  [name] , Thank you for taking time to come for your Medicare Wellness Visit. I appreciate your ongoing commitment to your health goals. Please review the following plan we discussed and let me know if I can assist you in the future.   These are the goals we discussed: Goals    . Absence of Fall and Fall-Related Injury     Evidence-based guidance:   Assess fall risk using a validated tool when available. Consider balance and gait impairment, muscle weakness, diminished vision or hearing, environmental hazards, presence of urinary or bowel urgency and/or incontinence.   Communicate fall injury risk to interprofessional healthcare team.   Develop a fall prevention plan with the patient and family.   Promote use of personal vision and auditory aids.   Promote reorientation, appropriate sensory stimulation, and routines to decrease risk of fall when changes in mental status are present.   Assess assistance level required for safe and effective self-care; consider referral for home care.   Encourage physical activity, such as performance of self-care at highest level of ability, strength and balance exercise program, and provision of appropriate assistive devices; refer to rehabilitation therapy.   Refer to community-based fall prevention program where available.   If fall occurs, determine the cause and revise fall injury prevention plan.   Regularly review medication contribution to fall risk; consider risk related to polypharmacy and age.   Refer to pharmacist for consultation when concerns about medications are revealed.   Balance adequate pain management with potential for oversedation.   Provide guidance related to environmental modifications.   Consider supplementation with Vitamin D.   Notes:     . Follow up with Provider as scheduled    . General - Client will not be readmitted within 30 days (C-SNP)       This is a list of the screening recommended for you and due  dates:  Health Maintenance  Topic Date Due  . Hemoglobin A1C  06/03/2020  . Eye exam for diabetics  10/15/2020  . Complete foot exam   11/07/2020  . Urine Protein Check  12/04/2020  . Tetanus Vaccine  10/22/2026  . Flu Shot  Completed  . COVID-19 Vaccine  Completed  . HPV Vaccine  Aged Out  . DEXA scan (bone density measurement)  Discontinued  . Pneumonia vaccines  Discontinued

## 2020-06-06 ENCOUNTER — Other Ambulatory Visit (HOSPITAL_COMMUNITY)
Admission: RE | Admit: 2020-06-06 | Discharge: 2020-06-06 | Disposition: A | Discharge status: Home / Self Care | Payer: Medicare Other | Source: Skilled Nursing Facility | Attending: Adult Health | Admitting: Adult Health

## 2020-06-06 DIAGNOSIS — R279 Unspecified lack of coordination: Secondary | ICD-10-CM | POA: Diagnosis not present

## 2020-06-06 DIAGNOSIS — I5032 Chronic diastolic (congestive) heart failure: Secondary | ICD-10-CM | POA: Insufficient documentation

## 2020-06-06 DIAGNOSIS — R262 Difficulty in walking, not elsewhere classified: Secondary | ICD-10-CM | POA: Insufficient documentation

## 2020-06-06 DIAGNOSIS — M6281 Muscle weakness (generalized): Secondary | ICD-10-CM | POA: Diagnosis not present

## 2020-06-06 DIAGNOSIS — N183 Chronic kidney disease, stage 3 unspecified: Secondary | ICD-10-CM | POA: Diagnosis not present

## 2020-06-06 LAB — CBC WITH DIFFERENTIAL/PLATELET
Abs Immature Granulocytes: 0.06 10*3/uL (ref 0.00–0.07)
Basophils Absolute: 0.1 10*3/uL (ref 0.0–0.1)
Basophils Relative: 1 %
Eosinophils Absolute: 0 10*3/uL (ref 0.0–0.5)
Eosinophils Relative: 0 %
HCT: 38.4 % (ref 36.0–46.0)
Hemoglobin: 10.7 g/dL — ABNORMAL LOW (ref 12.0–15.0)
Immature Granulocytes: 1 %
Lymphocytes Relative: 6 %
Lymphs Abs: 0.7 10*3/uL (ref 0.7–4.0)
MCH: 23.6 pg — ABNORMAL LOW (ref 26.0–34.0)
MCHC: 27.9 g/dL — ABNORMAL LOW (ref 30.0–36.0)
MCV: 84.6 fL (ref 80.0–100.0)
Monocytes Absolute: 0.7 10*3/uL (ref 0.1–1.0)
Monocytes Relative: 6 %
Neutro Abs: 9.6 10*3/uL — ABNORMAL HIGH (ref 1.7–7.7)
Neutrophils Relative %: 86 %
Platelets: 92 10*3/uL — ABNORMAL LOW (ref 150–400)
RBC: 4.54 MIL/uL (ref 3.87–5.11)
RDW: 21 % — ABNORMAL HIGH (ref 11.5–15.5)
WBC: 11.1 10*3/uL — ABNORMAL HIGH (ref 4.0–10.5)
nRBC: 0 % (ref 0.0–0.2)

## 2020-06-06 LAB — BASIC METABOLIC PANEL
Anion gap: 10 (ref 5–15)
BUN: 16 mg/dL (ref 8–23)
CO2: 25 mmol/L (ref 22–32)
Calcium: 9.4 mg/dL (ref 8.9–10.3)
Chloride: 102 mmol/L (ref 98–111)
Creatinine, Ser: 1.32 mg/dL — ABNORMAL HIGH (ref 0.44–1.00)
GFR, Estimated: 38 mL/min — ABNORMAL LOW (ref >60)
Glucose, Bld: 237 mg/dL — ABNORMAL HIGH (ref 70–99)
Potassium: 3.4 mmol/L — ABNORMAL LOW (ref 3.5–5.1)
Sodium: 137 mmol/L (ref 135–145)

## 2020-06-07 DIAGNOSIS — J810 Acute pulmonary edema: Secondary | ICD-10-CM | POA: Diagnosis not present

## 2020-06-07 DIAGNOSIS — I517 Cardiomegaly: Secondary | ICD-10-CM | POA: Diagnosis not present

## 2020-06-07 LAB — URINALYSIS, ROUTINE W REFLEX MICROSCOPIC
Bacteria, UA: NONE SEEN
Bilirubin Urine: NEGATIVE
Glucose, UA: 50 mg/dL — AB
Ketones, ur: NEGATIVE mg/dL
Nitrite: NEGATIVE
Protein, ur: NEGATIVE mg/dL
Specific Gravity, Urine: 1.009 (ref 1.005–1.030)
WBC, UA: >50 WBC/hpf — ABNORMAL HIGH (ref 0–5)
pH: 5 (ref 5.0–8.0)

## 2020-06-08 ENCOUNTER — Other Ambulatory Visit (HOSPITAL_COMMUNITY)
Admission: RE | Admit: 2020-06-08 | Discharge: 2020-06-08 | Disposition: A | Discharge status: Home / Self Care | Payer: Medicare Other | Source: Skilled Nursing Facility | Attending: Adult Health | Admitting: Adult Health

## 2020-06-08 ENCOUNTER — Encounter: Payer: Self-pay | Admitting: Adult Health

## 2020-06-08 ENCOUNTER — Non-Acute Institutional Stay (SKILLED_NURSING_FACILITY): Payer: Medicare Other | Admitting: Adult Health

## 2020-06-08 DIAGNOSIS — I5033 Acute on chronic diastolic (congestive) heart failure: Secondary | ICD-10-CM

## 2020-06-08 DIAGNOSIS — J449 Chronic obstructive pulmonary disease, unspecified: Secondary | ICD-10-CM | POA: Diagnosis not present

## 2020-06-08 DIAGNOSIS — E876 Hypokalemia: Secondary | ICD-10-CM | POA: Diagnosis not present

## 2020-06-08 DIAGNOSIS — J189 Pneumonia, unspecified organism: Secondary | ICD-10-CM | POA: Diagnosis not present

## 2020-06-08 LAB — BASIC METABOLIC PANEL
Anion gap: 8 (ref 5–15)
BUN: 17 mg/dL (ref 8–23)
CO2: 27 mmol/L (ref 22–32)
Calcium: 9.4 mg/dL (ref 8.9–10.3)
Chloride: 102 mmol/L (ref 98–111)
Creatinine, Ser: 1.25 mg/dL — ABNORMAL HIGH (ref 0.44–1.00)
GFR, Estimated: 41 mL/min — ABNORMAL LOW (ref >60)
Glucose, Bld: 251 mg/dL — ABNORMAL HIGH (ref 70–99)
Potassium: 3.6 mmol/L (ref 3.5–5.1)
Sodium: 137 mmol/L (ref 135–145)

## 2020-06-08 LAB — CBC WITH DIFFERENTIAL/PLATELET
Abs Immature Granulocytes: 0.03 10*3/uL (ref 0.00–0.07)
Basophils Absolute: 0 10*3/uL (ref 0.0–0.1)
Basophils Relative: 1 %
Eosinophils Absolute: 0.2 10*3/uL (ref 0.0–0.5)
Eosinophils Relative: 3 %
HCT: 32.8 % — ABNORMAL LOW (ref 36.0–46.0)
Hemoglobin: 9.2 g/dL — ABNORMAL LOW (ref 12.0–15.0)
Immature Granulocytes: 0 %
Lymphocytes Relative: 13 %
Lymphs Abs: 1.1 10*3/uL (ref 0.7–4.0)
MCH: 23.8 pg — ABNORMAL LOW (ref 26.0–34.0)
MCHC: 28 g/dL — ABNORMAL LOW (ref 30.0–36.0)
MCV: 84.8 fL (ref 80.0–100.0)
Monocytes Absolute: 0.7 10*3/uL (ref 0.1–1.0)
Monocytes Relative: 9 %
Neutro Abs: 6.4 10*3/uL (ref 1.7–7.7)
Neutrophils Relative %: 74 %
Platelets: 96 10*3/uL — ABNORMAL LOW (ref 150–400)
RBC: 3.87 MIL/uL (ref 3.87–5.11)
RDW: 20.7 % — ABNORMAL HIGH (ref 11.5–15.5)
WBC: 8.5 10*3/uL (ref 4.0–10.5)
nRBC: 0 % (ref 0.0–0.2)

## 2020-06-08 LAB — URINE CULTURE

## 2020-06-08 NOTE — Progress Notes (Signed)
Location:  [place] Room Number: 992 Place of Service:  SNF (31)   CODE STATUS: dnr   Allergies  Allergen Reactions  . Other     Band Aids   . Tape   . Lipitor [Atorvastatin Calcium] Rash and Other (See Comments)    Muscle pain  . Niaspan [Niacin Er] Rash and Other (See Comments)    Muscle pain    Chief Complaint  Patient presents with  . Acute Visit    Follow up chest x-ray     HPI:  She has developed a cough; with shortness of breath and low grade fever over the past weekend. She was started on IVF; blood work; urine culture and cxr. The cxr demonstrates interstitial edema; bronchial cuffing. She has rhonchi throughout her lung fields. She has lower extremity edema as well.   Past Medical History:  Diagnosis Date  . Anemia   . Atrial fibrillation ([place])   . Back pain   . Bacterial pneumonia   . Cancer (HCC)    Skin   . Chronic respiratory failure ([place])   . Cognitive communication deficit   . COPD (chronic obstructive pulmonary disease) ([place])   . Dementia ([place])   . Depression   . Diabetes mellitus without complication ([place])   . Diverticulosis   . Fatty liver   . Fibromyalgia   . Gastric polyp 09/08/11   at the anastomosis-inflammatory  . Generalized anxiety disorder   . GERD (gastroesophageal reflux disease)   . 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 Hypercholesteremia   . Hypertension   . Insomnia   . Internal hemorrhoids   . Muscle weakness   . Neuropathy   . On home O2    qhs  . Osteoporosis   . Rheumatoid arteritis ([place])   . Ulcer    stomach  . Vitamin D deficiency     Past Surgical History:  Procedure Laterality Date  . ABDOMINAL HYSTERECTOMY    . ABDOMINAL SURGERY     removed patial stomach from ulcer  . COLONOSCOPY  04-2001   internal hemorrhoids, panocolonic diverticulosis, and colon polyps   . UPPER GASTROINTESTINAL ENDOSCOPY      Social History   Socioeconomic History  . Marital status: Widowed     Spouse name: Not on file  . Number of children: 6  . Years of education: Not on file  . Highest education level: Not on file  Occupational History  . Occupation: Retired  Tobacco Use  . Smoking status: Never Smoker  . Smokeless tobacco: Never Used  Vaping Use  . Vaping Use: Never used  Substance and Sexual Activity  . Alcohol use: No  . Drug use: No  . Sexual activity: Not Currently  Other Topics Concern  . Not on file  Social History Narrative   [facility] of [facility]    Social Determinants of Health   Financial Resource Strain: Not on file  Food Insecurity: Not on file  Transportation Needs: Not on file  Physical Activity: Not on file  Stress: Not on file  Social Connections: Not on file  Intimate Partner Violence: Not on file   Family History  Problem Relation Age of Onset  . Heart disease Father   . Kidney disease Mother        kidney cancer  . Colon cancer Daughter 68      VITAL SIGNS BP (!) 152/70   Pulse 82   Temp 98.2 F (36.8 C)  Resp 18   Ht 5\' 3"  (1.6 m)   Wt 218 lb 3.2 oz (99 kg)   SpO2 94%   BMI 38.65 kg/m   Outpatient Encounter Medications as of 06/08/2020  Medication Sig  . acetaminophen (TYLENOL) 650 MG CR tablet Take 650 mg by mouth every 6 (six) hours.  Marland Kitchen apixaban (ELIQUIS) 2.5 MG TABS tablet Take 2.5 mg by mouth 2 (two) times daily.  Marland Kitchen denosumab (PROLIA) 60 MG/ML SOSY injection Inject 60 mg into the skin every 6 (six) months.  . diltiazem (CARDIZEM CD) 300 MG 24 hr capsule Take 300 mg by mouth daily.  Marland Kitchen donepezil (ARICEPT) 5 MG tablet Take 5 mg by mouth at bedtime.  . DULoxetine (CYMBALTA) 20 MG capsule Take 40 mg by mouth daily.   . Fluticasone-Umeclidin-Vilant (TRELEGY ELLIPTA) 100-62.5-25 MCG/INH AEPB One puff inhalation once a day  . folic acid (FOLVITE) 1 MG tablet Take 5 mg by mouth as directed. Once a day on Sunday  . furosemide (LASIX) 40 MG tablet Take 40 mg by mouth 2 (two) times daily.  Marland Kitchen guaiFENesin (MUCINEX) 600 MG 12  hr tablet Take 600 mg by mouth 2 (two) times daily.  . Menthol, Topical Analgesic, (BIOFREEZE) 4 % GEL Apply to bilateral  shoulders Once A Day PRN  . Menthol, Topical Analgesic, 4 % GEL Apply to both knees  daily as needed  . methotrexate (RHEUMATREX) 2.5 MG tablet Take 12.5 mg by mouth once a week. On Sunday - Caution:Chemotherapy. Protect from light.  . NON FORMULARY Diet Type:  NAS,  Consistent Carbohydrates, Dysphagia 3  . OXYGEN Inhale 3.5 L/min into the lungs continuous.  . pantoprazole (PROTONIX) 40 MG tablet Take 40 mg by mouth 2 (two) times daily.  . phenylephrine-shark liver oil-mineral oil-petrolatum (PREPARATION H) 0.25-3-14-71.9 % rectal ointment Place 1 application rectally 2 (two) times daily as needed for hemorrhoids.  . polyethylene glycol (MIRALAX / GLYCOLAX) packet Take 17 g by mouth daily as needed for moderate constipation.  . potassium chloride SA (K-DUR,KLOR-CON) 20 MEQ tablet Take 40 mEq by mouth 2 (two) times daily. From 01/13/2020-01/16/2020, Start 40 mg by mouth twice a day on 02/05/2020  . pregabalin (LYRICA) 50 MG capsule Take 1 capsule by mouth  Twice daily  . traMADol (ULTRAM) 50 MG tablet Take 0.5 tablets (25 mg total) by mouth 2 (two) times daily.  . vitamin B-12 (CYANOCOBALAMIN) 1000 MCG tablet Take 1,000 mcg by mouth daily. 8 am for b12 level of 388   No facility-administered encounter medications on file as of 06/08/2020.     SIGNIFICANT DIAGNOSTIC EXAMS   PREVIOUS;   01-07-19: chest x-ray; COPD. Aortic atherosclerosis no acute pathology    01-20-19: DEXA: t score -4.997  TODAY  05-10-20: chest x-ray: interstitial edema peribronchial cuffing; cardiomegaly; pulmonary vascular redistribution.    LABS REVIEWED PREVIOUS :   08-15-19: urine micro-albumin 5.0  12-05-19: wbc 6.8; hgb 10.6; hct 36.9; mcv 87.9 plt 104; glucose 188; bun 13; creat 1.04; k+ 3.9; na++ 136; ca 9.2 liver normal albumin 2.8 hgb a1c 7.0 urine micro-albumin <3.0 01-13-20: glucose 224;  bun 14; creat 1.21; k+ 4.3; na++ 137; ca 9.4 BNP 144.0 01-20-20: glucose 152; bun 14; creat 1.03 ;k+ 4.2; na++ 140 ca 9.9 03-02-20: glucose bun 20; creat 1.19 ;k+ 4.0; na++ 138; ca 9.6  05-12-20: wbc 4.6; hgb 9.9; hct 35.5; mcv 85.1 plt 102; glucose 185; bun 19; creat 1.19 ;k+ 3.7; na++ 142; ca 9.8; GFR44 vit B 12: 388; folate 20.3 06-06-20:  wbc 11.1; hgb 10.7; hct 38.4 mcv 84.6 plt 92; glucose 237; bun 16; creat 1.32; k+ 3.4; na++ 137; ca 9.4 GFR38; urine culture multiple species 06-08-20: wbc 8.5; hgb 9.2; hct 32.8; mcv 84.8 plt 96; glucose 251; bun 17; creat 1.25; k+ 3.6; na++ 137; ca 9.4 GFR41   Review of Systems  Constitutional: Negative for malaise/fatigue.  Respiratory: Positive for cough and shortness of breath.   Cardiovascular: Negative for chest pain, palpitations and leg swelling.  Gastrointestinal: Negative for abdominal pain, constipation and heartburn.  Musculoskeletal: Negative for back pain, joint pain and myalgias.  Skin: Negative.   Neurological: Negative for dizziness.  Psychiatric/Behavioral: The patient is not nervous/anxious.    Physical Exam Constitutional:      General: She is not in acute distress.    Appearance: She is well-developed. She is not diaphoretic.  Neck:     Thyroid: No thyromegaly.  Cardiovascular:     Rate and Rhythm: Normal rate and regular rhythm.     Pulses: Normal pulses.     Heart sounds: Normal heart sounds.  Pulmonary:     Effort: Pulmonary effort is normal. No respiratory distress.     Breath sounds: Rhonchi present.  Abdominal:     General: Bowel sounds are normal. There is no distension.     Palpations: Abdomen is soft.     Tenderness: There is no abdominal tenderness.  Musculoskeletal:     Cervical back: Neck supple.     Right lower leg: Edema present.     Left lower leg: Edema present.     Comments: 2-3+ bilateral lower extremity edema  Unable to move left upper extremity due to frozen left shoulder Able to move other  extremities   Lymphadenopathy:     Cervical: No cervical adenopathy.  Skin:    General: Skin is warm and dry.  Neurological:     Mental Status: She is alert. Mental status is at baseline.  Psychiatric:        Mood and Affect: Mood normal.       ASSESSMENT/ PLAN:  TODAY  1. Acute on chronic diastolic congestive heart failure 2 Chronic obstructive pulmonary disease unspecified COPD type 3. HCAP  1. Will begin augmentin 875 mg twice daily through 06-15-20 2. Will begin z-pack 3. Will give an extra 40 mg lasix daily with k+ 20 meq through 06-11-31 4. Will repeat BMP  Will monitor her status.     Ok Edwards NP [facility] [place] [facility]  Contact [phone] Monday through Friday 8am- 5pm  After hours call [phone]

## 2020-06-10 ENCOUNTER — Non-Acute Institutional Stay (SKILLED_NURSING_FACILITY): Payer: Medicare Other | Admitting: Adult Health

## 2020-06-10 ENCOUNTER — Encounter: Payer: Self-pay | Admitting: Adult Health

## 2020-06-10 DIAGNOSIS — E1122 Type 2 diabetes mellitus with diabetic chronic kidney disease: Secondary | ICD-10-CM

## 2020-06-10 DIAGNOSIS — N183 Chronic kidney disease, stage 3 unspecified: Secondary | ICD-10-CM | POA: Diagnosis not present

## 2020-06-10 DIAGNOSIS — K219 Gastro-esophageal reflux disease without esophagitis: Secondary | ICD-10-CM | POA: Diagnosis not present

## 2020-06-10 DIAGNOSIS — F0150 Vascular dementia without behavioral disturbance: Secondary | ICD-10-CM | POA: Diagnosis not present

## 2020-06-10 DIAGNOSIS — I5033 Acute on chronic diastolic (congestive) heart failure: Secondary | ICD-10-CM | POA: Insufficient documentation

## 2020-06-10 NOTE — Progress Notes (Signed)
Location:  [place] Room Number: 106/D Place of Service:  SNF (31)   CODE STATUS: DNR  Allergies  Allergen Reactions  . Other     Band Aids   . Tape   . Lipitor [Atorvastatin Calcium] Rash and Other (See Comments)    Muscle pain  . Niaspan [Niacin Er] Rash and Other (See Comments)    Muscle pain    Chief Complaint  Patient presents with  . Medical Management of Chronic Issues          GERD without esophagitis: Vascular dementia without behavioral disturbance: CKD stage 3 due to type 2 diabetes mellitus     HPI:  She is a 87 year old long term resident of this facility being seen for the management of her chronic illnesses:  GERD without esophagitis: Vascular dementia without behavioral disturbance: CKD stage 3 due to type 2 diabetes mellitus. She has had no further complications since being treated for pneumonia. She denies any change in cough or shortness of breath; no reports of uncontrolled pain; no reports of changes in appetite.   Past Medical History:  Diagnosis Date  . Anemia   . Atrial fibrillation ([place])   . Back pain   . Bacterial pneumonia   . Cancer (HCC)    Skin   . Chronic respiratory failure ([place])   . Cognitive communication deficit   . COPD (chronic obstructive pulmonary disease) ([place])   . Dementia ([place])   . Depression   . Diabetes mellitus without complication ([place])   . Diverticulosis   . Fatty liver   . Fibromyalgia   . Gastric polyp 09/08/11   at the anastomosis-inflammatory  . Generalized anxiety disorder   . GERD (gastroesophageal reflux disease)   . 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 Hypercholesteremia   . Hypertension   . Insomnia   . Internal hemorrhoids   . Muscle weakness   . Neuropathy   . On home O2    qhs  . Osteoporosis   . Rheumatoid arteritis ([place])   . Ulcer    stomach  . Vitamin D deficiency     Past Surgical History:  Procedure Laterality Date  . ABDOMINAL HYSTERECTOMY    .  ABDOMINAL SURGERY     removed patial stomach from ulcer  . COLONOSCOPY  04-2001   internal hemorrhoids, panocolonic diverticulosis, and colon polyps   . UPPER GASTROINTESTINAL ENDOSCOPY      Social History   Socioeconomic History  . Marital status: Widowed    Spouse name: Not on file  . Number of children: 6  . Years of education: Not on file  . Highest education level: Not on file  Occupational History  . Occupation: Retired  Tobacco Use  . Smoking status: Never Smoker  . Smokeless tobacco: Never Used  Vaping Use  . Vaping Use: Never used  Substance and Sexual Activity  . Alcohol use: No  . Drug use: No  . Sexual activity: Not Currently  Other Topics Concern  . Not on file  Social History Narrative   Long term resident of [facility]    Social Determinants of Health   Financial Resource Strain: Not on file  Food Insecurity: Not on file  Transportation Needs: Not on file  Physical Activity: Not on file  Stress: Not on file  Social Connections: Not on file  Intimate Partner Violence: Not on file   Family History  Problem Relation Age of Onset  . Heart disease Father   .  Kidney disease Mother        kidney cancer  . Colon cancer Daughter 7      VITAL SIGNS BP (!) 152/70   Pulse 82   Temp (!) 97.5 F (36.4 C)   Resp 20   Ht 5\' 3"  (1.6 m)   Wt 218 lb 3.2 oz (99 kg)   SpO2 97%   BMI 38.65 kg/m   Outpatient Encounter Medications as of 06/10/2020  Medication Sig  . acetaminophen (TYLENOL) 650 MG CR tablet Take 650 mg by mouth every 6 (six) hours.  Marland Kitchen acidophilus (RISAQUAD) CAPS capsule Take 1 capsule by mouth 2 (two) times daily.  Marland Kitchen amoxicillin-clavulanate (AUGMENTIN) 875-125 MG tablet Take by mouth 2 (two) times daily. Special Instructions: for HCAP  . apixaban (ELIQUIS) 2.5 MG TABS tablet Take 2.5 mg by mouth 2 (two) times daily.  Marland Kitchen azithromycin (ZITHROMAX) 250 MG tablet Take by mouth. HCAP daily  . denosumab (PROLIA) 60 MG/ML SOSY injection Inject 60 mg into  the skin every 6 (six) months.  . diltiazem (CARDIZEM CD) 300 MG 24 hr capsule Take 300 mg by mouth daily.  Marland Kitchen donepezil (ARICEPT) 5 MG tablet Take 10 mg by mouth at bedtime.  . DULoxetine (CYMBALTA) 20 MG capsule Take 40 mg by mouth daily.   . Fluticasone-Umeclidin-Vilant (TRELEGY ELLIPTA) 100-62.5-25 MCG/INH AEPB One puff inhalation once a day  . folic acid (FOLVITE) 1 MG tablet Take 5 mg by mouth as directed. Once a day on Sunday  . furosemide (LASIX) 40 MG tablet Take 40 mg by mouth 2 (two) times daily. Take 40 mg by mouth once a day from 06/08/2020-06/10/2020.  Marland Kitchen guaiFENesin (MUCINEX) 600 MG 12 hr tablet Take 600 mg by mouth 2 (two) times daily.  . Menthol, Topical Analgesic, (BIOFREEZE) 4 % GEL Apply to bilateral  shoulders Once A Day PRN  . Menthol, Topical Analgesic, 4 % GEL Apply to both knees  daily as needed  . methotrexate (RHEUMATREX) 2.5 MG tablet Take 12.5 mg by mouth once a week. On Sunday - Caution:Chemotherapy. Protect from light.  . miconazole (ZEASORB-AF) 2 % powder Apply 1 application topically 2 (two) times daily.  . NON FORMULARY Diet Type:  NAS,  Consistent Carbohydrates, Dysphagia 3  . ondansetron (ZOFRAN) 4 MG tablet Take 4 mg by mouth every 6 (six) hours as needed for nausea or vomiting.  . OXYGEN Inhale 3.5 L/min into the lungs continuous.  . pantoprazole (PROTONIX) 40 MG tablet Take 40 mg by mouth 2 (two) times daily.  . phenylephrine-shark liver oil-mineral oil-petrolatum (PREPARATION H) 0.25-3-14-71.9 % rectal ointment Place 1 application rectally 2 (two) times daily as needed for hemorrhoids.  . polyethylene glycol (MIRALAX / GLYCOLAX) packet Take 17 g by mouth daily as needed for moderate constipation.  . potassium chloride SA (K-DUR,KLOR-CON) 20 MEQ tablet Take 40 mEq by mouth 2 (two) times daily. On 06/08/2020- 06/10/2020 take 20 mg by mouth once a day  . pregabalin (LYRICA) 50 MG capsule Take 1 capsule by mouth  Twice daily  . traMADol (ULTRAM) 50 MG tablet Take 25  mg by mouth at bedtime.  . vitamin B-12 (CYANOCOBALAMIN) 1000 MCG tablet Take 1,000 mcg by mouth daily. 8 am for b12 level of 388  . [DISCONTINUED] traMADol (ULTRAM) 50 MG tablet Take 0.5 tablets (25 mg total) by mouth 2 (two) times daily.   No facility-administered encounter medications on file as of 06/10/2020.     SIGNIFICANT DIAGNOSTIC EXAMS  PREVIOUS;   01-07-19: chest x-ray;  COPD. Aortic atherosclerosis no acute pathology    01-20-19: DEXA: t score -4.997  05-10-20: chest x-ray: interstitial edema peribronchial cuffing; cardiomegaly; pulmonary vascular redistribution  NO NEW EXAMS. Marland Kitchen    LABS REVIEWED PREVIOUS :   08-15-19: urine micro-albumin 5.0  12-05-19: wbc 6.8; hgb 10.6; hct 36.9; mcv 87.9 plt 104; glucose 188; bun 13; creat 1.04; k+ 3.9; na++ 136; ca 9.2 liver normal albumin 2.8 hgb a1c 7.0 urine micro-albumin <3.0 01-13-20: glucose 224; bun 14; creat 1.21; k+ 4.3; na++ 137; ca 9.4 BNP 144.0 01-20-20: glucose 152; bun 14; creat 1.03 ;k+ 4.2; na++ 140 ca 9.9 03-02-20: glucose bun 20; creat 1.19 ;k+ 4.0; na++ 138; ca 9.6 05-12-20: wbc 4.6; hgb 9.9; hct 35.5; mcv 85.1 plt 102; glucose 185; bun 19; creat 1.19 ;k+ 3.7; na++ 142; ca 9.8; GFR44 vit B 12: 388; folate 20.3 06-06-20: wbc 11.1; hgb 10.7; hct 38.4 mcv 84.6 plt 92; glucose 237; bun 16; creat 1.32; k+ 3.4; na++ 137; ca 9.4 GFR38; urine culture multiple species 06-08-20: wbc 8.5; hgb 9.2; hct 32.8; mcv 84.8 plt 96; glucose 251; bun 17; creat 1.25; k+ 3.6; na++ 137; ca 9.4 GFR41  NO NEW LABS.   Review of Systems  Constitutional: Negative for malaise/fatigue.  Respiratory: Negative for cough and shortness of breath.   Cardiovascular: Negative for chest pain, palpitations and leg swelling.  Gastrointestinal: Negative for abdominal pain, constipation and heartburn.  Musculoskeletal: Negative for back pain, joint pain and myalgias.  Skin: Negative.   Neurological: Negative for dizziness.  Psychiatric/Behavioral: The patient is  not nervous/anxious.     Physical Exam Constitutional:      General: She is not in acute distress.    Appearance: She is well-developed. She is not diaphoretic.  Neck:     Thyroid: No thyromegaly.  Cardiovascular:     Rate and Rhythm: Normal rate and regular rhythm.     Pulses: Normal pulses.     Heart sounds: Normal heart sounds.  Pulmonary:     Effort: Pulmonary effort is normal. No respiratory distress.     Breath sounds: Normal breath sounds.  Abdominal:     General: Bowel sounds are normal. There is no distension.     Palpations: Abdomen is soft.     Tenderness: There is no abdominal tenderness.  Musculoskeletal:     Cervical back: Neck supple.     Right lower leg: Edema present.     Left lower leg: Edema present.     Comments: 2+ bilateral lower extremity edema  Unable to move left upper extremity due to frozen left shoulder Able to move other extremities    Lymphadenopathy:     Cervical: No cervical adenopathy.  Skin:    General: Skin is warm and dry.  Neurological:     Mental Status: She is alert. Mental status is at baseline.  Psychiatric:        Mood and Affect: Mood normal.       ASSESSMENT/ PLAN:  TODAY:   1. GERD without esophagitis: is stable will continue protonix 40 mg twice daily   2. Vascular dementia without behavioral disturbance: is without change weight is 218 pounds; will continue aricept 5 mg daily   3. CKD stage 3 due to type 2 diabetes mellitus: bun 17; creat 1.25 will monitor   PREVIOUS   4. Diabetic peripheral neuropathy associated with type 2 diabetes mellitus: is stable will continue lyrica 50 mg twice daily   5. Fibromyalgia: is stable will continue lyrica  50 mg twice daily and cymbalta 40 mg daily   6. Rheumatoid arteritis: is stable will continue methotrexate 54.6 mg weekly folic acid 5 mg weekly   7. Post-menopausal osteoporosis is stable t score -4.997 (01-20-19) will continue prolia 60 mg every 6 months  8. Chronic  constipation: is stable will continue miralax daily as needed  9. Type 2 diabetes mellitus with peripheral neuropathy is stable hgb a1c 7.0; is off tradjenta is on statin; eliquis.   10. Dyslipidemia associated with type 2 diabetes mellitus: is stable LDL 68 is off crestor will monitor   11. Atrial fibrillation with RVR: is stable will continue cardizem cd 300 mg daily for rate control and eliquis 2.5 mg twice daily   12. Chronic diastolic heart failure EF 55-60% ( 07-28-16) will continue lasix 40 mg twice daily with k+ 40 meq daily will monitor  13. Aortic atherosclerosis ( cxr 03-09-19) will monitor   14. Hypokalemia: is stable k+ 3.6 will continue k+ 40 meq twice daily   15. Unspecified COPD/ chronic respiratory failure with hypoxia is stable is 02 dependent will continue trelegy ellipta 100-62.5-25 mcg 1 puff daily         Ok Edwards NP [facility]  Contact [phone] Monday through Friday 8am- 5pm  After hours call [phone]

## 2020-06-11 ENCOUNTER — Other Ambulatory Visit (HOSPITAL_COMMUNITY)
Admission: RE | Admit: 2020-06-11 | Discharge: 2020-06-11 | Disposition: A | Discharge status: Home / Self Care | Payer: Medicare Other | Source: Skilled Nursing Facility | Attending: Adult Health | Admitting: Adult Health

## 2020-06-11 ENCOUNTER — Non-Acute Institutional Stay (SKILLED_NURSING_FACILITY): Payer: Medicare Other | Admitting: Adult Health

## 2020-06-11 ENCOUNTER — Encounter: Payer: Self-pay | Admitting: Adult Health

## 2020-06-11 DIAGNOSIS — I700 Atherosclerosis of aorta: Secondary | ICD-10-CM | POA: Diagnosis not present

## 2020-06-11 DIAGNOSIS — I5032 Chronic diastolic (congestive) heart failure: Secondary | ICD-10-CM

## 2020-06-11 DIAGNOSIS — J9611 Chronic respiratory failure with hypoxia: Secondary | ICD-10-CM | POA: Diagnosis not present

## 2020-06-11 DIAGNOSIS — I130 Hypertensive heart and chronic kidney disease with heart failure and stage 1 through stage 4 chronic kidney disease, or unspecified chronic kidney disease: Secondary | ICD-10-CM | POA: Diagnosis not present

## 2020-06-11 LAB — BASIC METABOLIC PANEL
Anion gap: 7 (ref 5–15)
BUN: 16 mg/dL (ref 8–23)
CO2: 29 mmol/L (ref 22–32)
Calcium: 9.4 mg/dL (ref 8.9–10.3)
Chloride: 100 mmol/L (ref 98–111)
Creatinine, Ser: 1.27 mg/dL — ABNORMAL HIGH (ref 0.44–1.00)
GFR, Estimated: 40 mL/min — ABNORMAL LOW (ref >60)
Glucose, Bld: 189 mg/dL — ABNORMAL HIGH (ref 70–99)
Potassium: 3.6 mmol/L (ref 3.5–5.1)
Sodium: 136 mmol/L (ref 135–145)

## 2020-06-11 NOTE — Progress Notes (Signed)
Location:  [place] Room Number: 366 Place of Service:  SNF (31)   CODE STATUS: DNR  Allergies  Allergen Reactions  . Other     Band Aids   . Tape   . Lipitor [Atorvastatin Calcium] Rash and Other (See Comments)    Muscle pain  . Niaspan [Niacin Er] Rash and Other (See Comments)    Muscle pain    Chief Complaint  Patient presents with  . Acute Visit    Care plan meeting     HPI:  We have come together for her care plan meeting. BIM 6/30 mood 8/30. She requires limited to supervision for her adls. She is abe to feed herself. She is occasionally incontinent of bladder and bowel. She is wheelchair bound. She as had one fall without injury. Her cbg readings are stable no therapy. Her weight is stable at 218 pounds. She is presently being treated for HCAP. She continues to be followed for her chronic illnesses including: Aortic atherosclerosis  Chronic diastolic congestive heart failure  Chronic respiratory failure with hypoxia  Past Medical History:  Diagnosis Date  . Anemia   . Atrial fibrillation ([place])   . Back pain   . Bacterial pneumonia   . Cancer (HCC)    Skin   . Chronic respiratory failure ([place])   . Cognitive communication deficit   . COPD (chronic obstructive pulmonary disease) ([place])   . Dementia ([place])   . Depression   . Diabetes mellitus without complication ([place])   . Diverticulosis   . Fatty liver   . Fibromyalgia   . Gastric polyp 09/08/11   at the anastomosis-inflammatory  . Generalized anxiety disorder   . GERD (gastroesophageal reflux disease)   . 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 Hypercholesteremia   . Hypertension   . Insomnia   . Internal hemorrhoids   . Muscle weakness   . Neuropathy   . On home O2    qhs  . Osteoporosis   . Rheumatoid arteritis ([place])   . Ulcer    stomach  . Vitamin D deficiency     Past Surgical History:  Procedure Laterality Date  . ABDOMINAL HYSTERECTOMY    . ABDOMINAL SURGERY      removed patial stomach from ulcer  . COLONOSCOPY  04-2001   internal hemorrhoids, panocolonic diverticulosis, and colon polyps   . UPPER GASTROINTESTINAL ENDOSCOPY      Social History   Socioeconomic History  . Marital status: Widowed    Spouse name: Not on file  . Number of children: 6  . Years of education: Not on file  . Highest education level: Not on file  Occupational History  . Occupation: Retired  Tobacco Use  . Smoking status: Never Smoker  . Smokeless tobacco: Never Used  Vaping Use  . Vaping Use: Never used  Substance and Sexual Activity  . Alcohol use: No  . Drug use: No  . Sexual activity: Not Currently  Other Topics Concern  . Not on file  Social History Narrative   Long term resident of [facility]    Social Determinants of Health   Financial Resource Strain: Not on file  Food Insecurity: Not on file  Transportation Needs: Not on file  Physical Activity: Not on file  Stress: Not on file  Social Connections: Not on file  Intimate Partner Violence: Not on file   Family History  Problem Relation Age of Onset  . Heart disease Father   . Kidney  disease Mother        kidney cancer  . Colon cancer Daughter 69      VITAL SIGNS BP (!) 152/70   Pulse 82   Temp (!) 97.5 F (36.4 C)   Resp 18   Ht 5\' 3"  (1.6 m)   Wt 218 lb 3.2 oz (99 kg)   SpO2 97%   BMI 38.65 kg/m   Outpatient Encounter Medications as of 06/11/2020  Medication Sig  . acetaminophen (TYLENOL) 650 MG CR tablet Take 650 mg by mouth every 6 (six) hours.  Marland Kitchen acidophilus (RISAQUAD) CAPS capsule Take 1 capsule by mouth 2 (two) times daily.  Marland Kitchen amoxicillin-clavulanate (AUGMENTIN) 875-125 MG tablet Take by mouth 2 (two) times daily. Special Instructions: for HCAP  . apixaban (ELIQUIS) 2.5 MG TABS tablet Take 2.5 mg by mouth 2 (two) times daily.  Marland Kitchen azithromycin (ZITHROMAX) 250 MG tablet Take by mouth. HCAP daily  . denosumab (PROLIA) 60 MG/ML SOSY injection Inject 60 mg into the skin every 6 (six)  months.  . diltiazem (CARDIZEM CD) 300 MG 24 hr capsule Take 300 mg by mouth daily.  Marland Kitchen donepezil (ARICEPT) 5 MG tablet Take 10 mg by mouth at bedtime.  . DULoxetine (CYMBALTA) 20 MG capsule Take 40 mg by mouth daily.   . Fluticasone-Umeclidin-Vilant (TRELEGY ELLIPTA) 100-62.5-25 MCG/INH AEPB One puff inhalation once a day  . folic acid (FOLVITE) 1 MG tablet Take 5 mg by mouth as directed. Once a day on Sunday  . furosemide (LASIX) 40 MG tablet Take 40 mg by mouth 2 (two) times daily. Take 40 mg by mouth once a day from 06/08/2020-06/10/2020.  Marland Kitchen guaiFENesin (MUCINEX) 600 MG 12 hr tablet Take 600 mg by mouth 2 (two) times daily.  . Menthol, Topical Analgesic, (BIOFREEZE) 4 % GEL Apply to bilateral  shoulders Once A Day PRN  . Menthol, Topical Analgesic, 4 % GEL Apply to both knees  daily as needed  . methotrexate (RHEUMATREX) 2.5 MG tablet Take 12.5 mg by mouth once a week. On Sunday - Caution:Chemotherapy. Protect from light.  . miconazole (ZEASORB-AF) 2 % powder Apply 1 application topically 2 (two) times daily.  . NON FORMULARY Diet Type:  NAS,  Consistent Carbohydrates, Dysphagia 3  . ondansetron (ZOFRAN) 4 MG tablet Take 4 mg by mouth every 6 (six) hours as needed for nausea or vomiting.  . OXYGEN Inhale 3.5 L/min into the lungs continuous.  . pantoprazole (PROTONIX) 40 MG tablet Take 40 mg by mouth 2 (two) times daily.  . phenylephrine-shark liver oil-mineral oil-petrolatum (PREPARATION H) 0.25-3-14-71.9 % rectal ointment Place 1 application rectally 2 (two) times daily as needed for hemorrhoids.  . polyethylene glycol (MIRALAX / GLYCOLAX) packet Take 17 g by mouth daily as needed for moderate constipation.  . potassium chloride SA (K-DUR,KLOR-CON) 20 MEQ tablet Take 40 mEq by mouth 2 (two) times daily. On 06/08/2020- 06/10/2020 take 20 mg by mouth once a day  . pregabalin (LYRICA) 50 MG capsule Take 1 capsule by mouth  Twice daily  . traMADol (ULTRAM) 50 MG tablet Take 25 mg by mouth at bedtime.   . vitamin B-12 (CYANOCOBALAMIN) 1000 MCG tablet Take 1,000 mcg by mouth daily. 8 am for b12 level of 388   No facility-administered encounter medications on file as of 06/11/2020.     SIGNIFICANT DIAGNOSTIC EXAMS  PREVIOUS;   01-07-19: chest x-ray; COPD. Aortic atherosclerosis no acute pathology    01-20-19: DEXA: t score -4.997  05-10-20: chest x-ray: interstitial edema peribronchial  cuffing; cardiomegaly; pulmonary vascular redistribution.   NO NEW EXAMS.    LABS REVIEWED PREVIOUS :   08-15-19: urine micro-albumin 5.0  12-05-19: wbc 6.8; hgb 10.6; hct 36.9; mcv 87.9 plt 104; glucose 188; bun 13; creat 1.04; k+ 3.9; na++ 136; ca 9.2 liver normal albumin 2.8 hgb a1c 7.0 urine micro-albumin <3.0 01-13-20: glucose 224; bun 14; creat 1.21; k+ 4.3; na++ 137; ca 9.4 BNP 144.0 01-20-20: glucose 152; bun 14; creat 1.03 ;k+ 4.2; na++ 140 ca 9.9 03-02-20: glucose bun 20; creat 1.19 ;k+ 4.0; na++ 138; ca 9.6 05-12-20: wbc 4.6; hgb 9.9; hct 35.5; mcv 85.1 plt 102; glucose 185; bun 19; creat 1.19 ;k+ 3.7; na++ 142; ca 9.8; GFR44 vit B 12: 388; folate 20.3 06-06-20: wbc 11.1; hgb 10.7; hct 38.4 mcv 84.6 plt 92; glucose 237; bun 16; creat 1.32; k+ 3.4; na++ 137; ca 9.4 GFR38; urine culture multiple species 06-08-20: wbc 8.5; hgb 9.2; hct 32.8; mcv 84.8 plt 96; glucose 251; bun 17; creat 1.25; k+ 3.6; na++ 137; ca 9.4 GFR41  NO NEW LABS.   Review of Systems  Constitutional: Negative for malaise/fatigue.  Respiratory: Negative for cough and shortness of breath.   Cardiovascular: Negative for chest pain, palpitations and leg swelling.  Gastrointestinal: Negative for abdominal pain, constipation and heartburn.  Musculoskeletal: Negative for back pain, joint pain and myalgias.  Skin: Negative.   Neurological: Negative for dizziness.  Psychiatric/Behavioral: The patient is not nervous/anxious.     Physical Exam Constitutional:      General: She is not in acute distress.    Appearance: She is  well-developed. She is not diaphoretic.  Neck:     Thyroid: No thyromegaly.  Cardiovascular:     Rate and Rhythm: Normal rate and regular rhythm.     Heart sounds: Normal heart sounds.  Pulmonary:     Effort: Pulmonary effort is normal. No respiratory distress.     Breath sounds: Normal breath sounds.  Abdominal:     General: Bowel sounds are normal. There is no distension.     Palpations: Abdomen is soft.     Tenderness: There is no abdominal tenderness.  Musculoskeletal:     Right lower leg: Edema present.     Left lower leg: Edema present.     Comments: 2 bilateral lower extremity edema  Unable to move left upper extremity due to frozen left shoulder Able to move other extremities    Lymphadenopathy:     Cervical: No cervical adenopathy.  Skin:    General: Skin is warm and dry.  Neurological:     Mental Status: She is alert. Mental status is at baseline.  Psychiatric:        Mood and Affect: Mood normal.       ASSESSMENT/ PLAN:  TODAY  1. Aortic atherosclerosis 2. Chronic diastolic congestive heart failure 3. Chronic respiratory failure with hypoxia  Will continue current medications Will continue current plan of care Will continue to monitor her status.   Time spent with patient 40 minutes >50% spent with coordination of care to include nursing care and medications.    Ok Edwards NP [facility]  Contact [phone] Monday through Friday 8am- 5pm  After hours call [phone]

## 2020-06-15 ENCOUNTER — Other Ambulatory Visit: Payer: Self-pay | Admitting: Adult Health

## 2020-06-15 MED ORDER — TRAMADOL HCL 50 MG PO TABS
25.0000 mg | ORAL_TABLET | Freq: Every day | ORAL | 0 refills | Status: DC
Start: 2020-06-15 — End: 2020-07-10

## 2020-06-15 MED ORDER — PREGABALIN 50 MG PO CAPS: ORAL_CAPSULE | ORAL | 0 refills | Status: DC

## 2020-06-18 ENCOUNTER — Other Ambulatory Visit (HOSPITAL_COMMUNITY)
Admission: RE | Admit: 2020-06-18 | Discharge: 2020-06-18 | Disposition: A | Discharge status: Home / Self Care | Payer: Medicare Other | Source: Skilled Nursing Facility | Attending: Adult Health | Admitting: Adult Health

## 2020-06-18 DIAGNOSIS — E118 Type 2 diabetes mellitus with unspecified complications: Secondary | ICD-10-CM | POA: Insufficient documentation

## 2020-06-18 LAB — HEMOGLOBIN A1C
Hgb A1c MFr Bld: 7.4 % — ABNORMAL HIGH (ref 4.8–5.6)
Mean Plasma Glucose: 165.68 mg/dL

## 2020-06-24 DIAGNOSIS — Z23 Encounter for immunization: Secondary | ICD-10-CM | POA: Diagnosis not present

## 2020-07-07 ENCOUNTER — Non-Acute Institutional Stay (SKILLED_NURSING_FACILITY): Payer: Medicare Other | Admitting: Adult Health

## 2020-07-07 ENCOUNTER — Encounter: Payer: Self-pay | Admitting: Adult Health

## 2020-07-07 DIAGNOSIS — I700 Atherosclerosis of aorta: Secondary | ICD-10-CM | POA: Diagnosis not present

## 2020-07-07 DIAGNOSIS — M810 Age-related osteoporosis without current pathological fracture: Secondary | ICD-10-CM

## 2020-07-07 DIAGNOSIS — K5909 Other constipation: Secondary | ICD-10-CM

## 2020-07-07 DIAGNOSIS — K219 Gastro-esophageal reflux disease without esophagitis: Secondary | ICD-10-CM

## 2020-07-07 DIAGNOSIS — N183 Chronic kidney disease, stage 3 unspecified: Secondary | ICD-10-CM

## 2020-07-07 DIAGNOSIS — L603 Nail dystrophy: Secondary | ICD-10-CM | POA: Diagnosis not present

## 2020-07-07 DIAGNOSIS — E785 Hyperlipidemia, unspecified: Secondary | ICD-10-CM

## 2020-07-07 DIAGNOSIS — J449 Chronic obstructive pulmonary disease, unspecified: Secondary | ICD-10-CM | POA: Diagnosis not present

## 2020-07-07 DIAGNOSIS — E1122 Type 2 diabetes mellitus with diabetic chronic kidney disease: Secondary | ICD-10-CM

## 2020-07-07 DIAGNOSIS — E1151 Type 2 diabetes mellitus with diabetic peripheral angiopathy without gangrene: Secondary | ICD-10-CM | POA: Diagnosis not present

## 2020-07-07 DIAGNOSIS — J9611 Chronic respiratory failure with hypoxia: Secondary | ICD-10-CM

## 2020-07-07 DIAGNOSIS — Z7984 Long term (current) use of oral hypoglycemic drugs: Secondary | ICD-10-CM | POA: Diagnosis not present

## 2020-07-07 DIAGNOSIS — M797 Fibromyalgia: Secondary | ICD-10-CM

## 2020-07-07 DIAGNOSIS — M0520 Rheumatoid vasculitis with rheumatoid arthritis of unspecified site: Secondary | ICD-10-CM | POA: Diagnosis not present

## 2020-07-07 DIAGNOSIS — E1169 Type 2 diabetes mellitus with other specified complication: Secondary | ICD-10-CM | POA: Diagnosis not present

## 2020-07-07 DIAGNOSIS — I5032 Chronic diastolic (congestive) heart failure: Secondary | ICD-10-CM

## 2020-07-07 DIAGNOSIS — F339 Major depressive disorder, recurrent, unspecified: Secondary | ICD-10-CM

## 2020-07-07 DIAGNOSIS — B351 Tinea unguium: Secondary | ICD-10-CM | POA: Diagnosis not present

## 2020-07-07 DIAGNOSIS — F0150 Vascular dementia without behavioral disturbance: Secondary | ICD-10-CM

## 2020-07-07 DIAGNOSIS — E876 Hypokalemia: Secondary | ICD-10-CM

## 2020-07-07 DIAGNOSIS — M2141 Flat foot [pes planus] (acquired), right foot: Secondary | ICD-10-CM | POA: Diagnosis not present

## 2020-07-07 DIAGNOSIS — Z794 Long term (current) use of insulin: Secondary | ICD-10-CM | POA: Diagnosis not present

## 2020-07-07 DIAGNOSIS — E1142 Type 2 diabetes mellitus with diabetic polyneuropathy: Secondary | ICD-10-CM | POA: Diagnosis not present

## 2020-07-07 NOTE — Progress Notes (Signed)
Location:  [place] Room Number: 106D Place of Service:  SNF (31)   CODE STATUS: DNR  Allergies  Allergen Reactions  . Other     Band Aids   . Tape   . Lipitor [Atorvastatin Calcium] Rash and Other (See Comments)    Muscle pain  . Niaspan [Niacin Er] Rash and Other (See Comments)    Muscle pain    Chief Complaint  Patient presents with  . Annual Exam    Annual Exam    HPI:  She is a 87 year old long term resident of this facility being seen for her annual exam. She has not been hospitalized over the past year; no trips to the ED. Her weight has been stable over the past year. There are no reports of recent falls. There are no reports of uncontrolled pain. No reports of shortness of breath; cough. She does continue to get out of bed daily. She continues to be followed for her chronic illnesses including: Diabetic peripheral neuropathy associated with type 2 diabetes mellitus:     Fibromyalgia:   Rheumatoid arteritis  Past Medical History:  Diagnosis Date  . Anemia   . Atrial fibrillation ([place])   . Back pain   . Bacterial pneumonia   . Cancer (HCC)    Skin   . Chronic respiratory failure ([place])   . Cognitive communication deficit   . COPD (chronic obstructive pulmonary disease) ([place])   . Dementia ([place])   . Depression   . Diabetes mellitus without complication ([place])   . Diverticulosis   . Fatty liver   . Fibromyalgia   . Gastric polyp 09/08/11   at the anastomosis-inflammatory  . Generalized anxiety disorder   . GERD (gastroesophageal reflux disease)   . 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 Hypercholesteremia   . Hypertension   . Insomnia   . Internal hemorrhoids   . Muscle weakness   . Neuropathy   . On home O2    qhs  . Osteoporosis   . Rheumatoid arteritis ([place])   . Ulcer    stomach  . Vitamin D deficiency     Past Surgical History:  Procedure Laterality Date  . ABDOMINAL HYSTERECTOMY    . ABDOMINAL SURGERY      removed patial stomach from ulcer  . COLONOSCOPY  04-2001   internal hemorrhoids, panocolonic diverticulosis, and colon polyps   . UPPER GASTROINTESTINAL ENDOSCOPY      Social History   Socioeconomic History  . Marital status: Widowed    Spouse name: Not on file  . Number of children: 6  . Years of education: Not on file  . Highest education level: Not on file  Occupational History  . Occupation: Retired  Tobacco Use  . Smoking status: Never Smoker  . Smokeless tobacco: Never Used  Vaping Use  . Vaping Use: Never used  Substance and Sexual Activity  . Alcohol use: No  . Drug use: No  . Sexual activity: Not Currently  Other Topics Concern  . Not on file  Social History Narrative   Long term resident of [facility]    Social Determinants of Health   Financial Resource Strain: Not on file  Food Insecurity: Not on file  Transportation Needs: Not on file  Physical Activity: Not on file  Stress: Not on file  Social Connections: Not on file  Intimate Partner Violence: Not on file   Family History  Problem Relation Age of Onset  . Heart disease  Father   . Kidney disease Mother        kidney cancer  . Colon cancer Daughter 49      VITAL SIGNS BP (!) 142/71   Pulse 69   Temp (!) 96.6 F (35.9 C)   Resp 18   Ht 5\' 3"  (1.6 m)   Wt 218 lb 6.4 oz (99.1 kg)   SpO2 92%   BMI 38.69 kg/m   Outpatient Encounter Medications as of 07/07/2020  Medication Sig  . acetaminophen (TYLENOL) 650 MG CR tablet Take 650 mg by mouth every 6 (six) hours.  Marland Kitchen apixaban (ELIQUIS) 2.5 MG TABS tablet Take 2.5 mg by mouth 2 (two) times daily.  Marland Kitchen denosumab (PROLIA) 60 MG/ML SOSY injection Inject 60 mg into the skin every 6 (six) months.  . diltiazem (CARDIZEM CD) 300 MG 24 hr capsule Take 300 mg by mouth daily.  Marland Kitchen donepezil (ARICEPT) 5 MG tablet Take 10 mg by mouth at bedtime.  . DULoxetine (CYMBALTA) 20 MG capsule Take 40 mg by mouth daily.   . Fluticasone-Umeclidin-Vilant (TRELEGY ELLIPTA)  100-62.5-25 MCG/INH AEPB One puff inhalation once a day  . folic acid (FOLVITE) 1 MG tablet Take 5 mg by mouth as directed. Once a day on Sunday  . furosemide (LASIX) 40 MG tablet Take 40 mg by mouth 2 (two) times daily. Take 40 mg by mouth once a day from 06/08/2020-06/10/2020.  Marland Kitchen guaiFENesin (MUCINEX) 600 MG 12 hr tablet Take 600 mg by mouth 2 (two) times daily.  . Menthol, Topical Analgesic, (BIOFREEZE) 4 % GEL Apply to bilateral  shoulders Once A Day PRN  . Menthol, Topical Analgesic, 4 % GEL Apply to both knees  daily as needed  . methotrexate (RHEUMATREX) 2.5 MG tablet Take 12.5 mg by mouth once a week. On Sunday - Caution:Chemotherapy. Protect from light.  . miconazole (ZEASORB-AF) 2 % powder Apply 1 application topically 2 (two) times daily.  . NON FORMULARY Diet Type:  NAS,  Consistent Carbohydrates, Dysphagia 3  . ondansetron (ZOFRAN) 4 MG tablet Take 4 mg by mouth every 6 (six) hours as needed for nausea or vomiting.  . OXYGEN Inhale 3.5 L/min into the lungs continuous.  . pantoprazole (PROTONIX) 40 MG tablet Take 40 mg by mouth 2 (two) times daily.  . phenylephrine-shark liver oil-mineral oil-petrolatum (PREPARATION H) 0.25-3-14-71.9 % rectal ointment Place 1 application rectally 2 (two) times daily as needed for hemorrhoids.  . polyethylene glycol (MIRALAX / GLYCOLAX) packet Take 17 g by mouth daily as needed for moderate constipation.  . potassium chloride SA (K-DUR,KLOR-CON) 20 MEQ tablet Take 40 mEq by mouth 2 (two) times daily. On 06/08/2020- 06/10/2020 take 20 mg by mouth once a day  . pregabalin (LYRICA) 50 MG capsule Take 1 capsule by mouth  Twice daily  . traMADol (ULTRAM) 50 MG tablet Take 0.5 tablets (25 mg total) by mouth at bedtime.  . vitamin B-12 (CYANOCOBALAMIN) 1000 MCG tablet Take 1,000 mcg by mouth daily. 8 am for b12 level of 388  . [DISCONTINUED] amoxicillin-clavulanate (AUGMENTIN) 875-125 MG tablet Take by mouth 2 (two) times daily. Special Instructions: for HCAP  .  [DISCONTINUED] azithromycin (ZITHROMAX) 250 MG tablet Take by mouth. HCAP daily   No facility-administered encounter medications on file as of 07/07/2020.     SIGNIFICANT DIAGNOSTIC EXAMS   PREVIOUS;    01-20-19: DEXA: t score -4.997  05-10-20: chest x-ray: interstitial edema peribronchial cuffing; cardiomegaly; pulmonary vascular redistribution  NO NEW EXAMS. Marland Kitchen    LABS REVIEWED  PREVIOUS :   08-15-19: urine micro-albumin 5.0  12-05-19: wbc 6.8; hgb 10.6; hct 36.9; mcv 87.9 plt 104; glucose 188; bun 13; creat 1.04; k+ 3.9; na++ 136; ca 9.2 liver normal albumin 2.8 hgb a1c 7.0 urine micro-albumin <3.0 01-13-20: glucose 224; bun 14; creat 1.21; k+ 4.3; na++ 137; ca 9.4 BNP 144.0 01-20-20: glucose 152; bun 14; creat 1.03 ;k+ 4.2; na++ 140 ca 9.9 03-02-20: glucose bun 20; creat 1.19 ;k+ 4.0; na++ 138; ca 9.6 05-12-20: wbc 4.6; hgb 9.9; hct 35.5; mcv 85.1 plt 102; glucose 185; bun 19; creat 1.19 ;k+ 3.7; na++ 142; ca 9.8; GFR44 vit B 12: 388; folate 20.3 06-06-20: wbc 11.1; hgb 10.7; hct 38.4 mcv 84.6 plt 92; glucose 237; bun 16; creat 1.32; k+ 3.4; na++ 137; ca 9.4 GFR38; urine culture multiple species 06-08-20: wbc 8.5; hgb 9.2; hct 32.8; mcv 84.8 plt 96; glucose 251; bun 17; creat 1.25; k+ 3.6; na++ 137; ca 9.4 GFR41  TODAY  06-11-20: glucose 189; bun 16; creat 1.27; k+ 3.6; na++ 136; ca 9.4 GFR 40 06-18-20: hgb a1c 7.4   Review of Systems  Constitutional: Negative for malaise/fatigue.  Respiratory: Negative for cough and shortness of breath.   Cardiovascular: Negative for chest pain, palpitations and leg swelling.  Gastrointestinal: Negative for abdominal pain, constipation and heartburn.  Musculoskeletal: Negative for back pain, joint pain and myalgias.  Skin: Negative.   Neurological: Negative for dizziness.  Psychiatric/Behavioral: The patient is not nervous/anxious.       Physical Exam Constitutional:      General: She is not in acute distress.    Appearance: She is well-developed.  She is not diaphoretic.  HENT:     Right Ear: External ear normal.     Left Ear: External ear normal.     Nose: Nose normal.     Mouth/Throat:     Mouth: Mucous membranes are moist.     Pharynx: Oropharynx is clear.  Eyes:     Conjunctiva/sclera: Conjunctivae normal.  Neck:     Thyroid: No thyromegaly.  Cardiovascular:     Rate and Rhythm: Normal rate and regular rhythm.     Pulses: Normal pulses.     Heart sounds: Normal heart sounds.  Pulmonary:     Effort: Pulmonary effort is normal. No respiratory distress.     Breath sounds: Normal breath sounds.  Abdominal:     General: Bowel sounds are normal. There is no distension.     Palpations: Abdomen is soft.     Tenderness: There is no abdominal tenderness.  Musculoskeletal:     Cervical back: Neck supple.     Right lower leg: Edema present.     Left lower leg: Edema present.     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  Skin:    General: Skin is warm and dry.  Neurological:     Mental Status: She is alert. Mental status is at baseline.  Psychiatric:        Mood and Affect: Mood normal.      ASSESSMENT/ PLAN:  TODAY:   1. Diabetic peripheral neuropathy associated with type 2 diabetes mellitus: is stable will continue lyrica 50 mg twice daily   2. Fibromyalgia: is stable will continue lyrica 50 mg twice daily and cymbalta 40 mg daily   3. Rheumatoid arteritis: is  stable will continue methotrexate 12.5 mg weekly and folic acid 5 mg weekly   4. Post menopausal osteoporosis is stable t score -4.997 (01-20-19) will continue prolia 60 mg every 6 months  5. Chronic constipation: is stable will continue miralax daily as needed  6. Type 2 diabetes mellitus with peripheral neuropathy: is stable hgb a1c 7.4 is off tradjenta is on eliquis  7. Dyslipidemia associated  with type 2 diabetes mellitus: is stable LDL 68 is off crestor will monitor   8. Atrial fibrillation with RVR is stable will continue cardizem cd 300 mg daily for rate control and eliquis 2.5 mg twice daily   9. Chronic diastolic heart failure EF 55-60% (07-28-16) will continue lasix 40 mg twice daily with k+ 40 meq daily will monitor  10. Aortic atherosclerosis (cxr 03-09-19) will monitor   11. Hypokalemia is stable k+ 3.6 will continue k+ 40 meq daily  12. Unspecified COPD/ chronic respiratory failure with hypoxia is stable is 02 dependent will continue trelegy ellipta 100-62.5-25 mcg 1 puff daily   13. GERD without esophagitis: is stable will continue protonix 40 mg twice daily   14. Vascular dementia without behavioral disturbance is stable weight is 218 pounds will continue aricept 5 mg daily   15. CKD stage 3 due to type 2 diabetes mellitus: is stable bun 16; creat 1.26 will monitor      Ok Edwards NP [facility]  Contact [phone] Monday through Friday 8am- 5pm  After hours call [phone]

## 2020-07-10 ENCOUNTER — Other Ambulatory Visit: Payer: Self-pay | Admitting: Adult Health

## 2020-07-10 MED ORDER — TRAMADOL HCL 50 MG PO TABS
25.0000 mg | ORAL_TABLET | Freq: Every day | ORAL | 0 refills | Status: DC
Start: 2020-07-10 — End: 2020-08-03

## 2020-07-10 MED ORDER — PREGABALIN 50 MG PO CAPS: ORAL_CAPSULE | ORAL | 0 refills | Status: DC

## 2020-08-03 ENCOUNTER — Other Ambulatory Visit: Payer: Self-pay | Admitting: Adult Health

## 2020-08-03 MED ORDER — TRAMADOL HCL 50 MG PO TABS
25.0000 mg | ORAL_TABLET | Freq: Every day | ORAL | 0 refills | Status: DC
Start: 2020-08-03 — End: 2020-08-13

## 2020-08-03 MED ORDER — PREGABALIN 50 MG PO CAPS: ORAL_CAPSULE | ORAL | 0 refills | Status: DC

## 2020-08-07 ENCOUNTER — Non-Acute Institutional Stay (SKILLED_NURSING_FACILITY): Payer: Medicare Other | Admitting: Adult Health

## 2020-08-07 ENCOUNTER — Encounter: Payer: Self-pay | Admitting: Adult Health

## 2020-08-07 DIAGNOSIS — M810 Age-related osteoporosis without current pathological fracture: Secondary | ICD-10-CM | POA: Diagnosis not present

## 2020-08-07 DIAGNOSIS — E1142 Type 2 diabetes mellitus with diabetic polyneuropathy: Secondary | ICD-10-CM

## 2020-08-07 DIAGNOSIS — M05741 Rheumatoid arthritis with rheumatoid factor of right hand without organ or systems involvement: Secondary | ICD-10-CM

## 2020-08-07 DIAGNOSIS — M797 Fibromyalgia: Secondary | ICD-10-CM | POA: Diagnosis not present

## 2020-08-07 NOTE — Progress Notes (Signed)
Location:  [place] Room Number: 106-D Place of Service:  SNF (31)   CODE STATUS: DNR  Allergies  Allergen Reactions  . Other     Band Aids   . Tape   . Lipitor [Atorvastatin Calcium] Rash and Other (See Comments)    Muscle pain  . Niaspan [Niacin Er] Rash and Other (See Comments)    Muscle pain    Chief Complaint  Patient presents with  . Medical Management of Chronic Issues             Diabetic peripheral neuropathy associated with type 2 diabetes mellitus:      Fibromyalgia:      Rheumatoid arthritis:   Post menopausal osteoporosis    HPI:  She is a 87 year old long term resident of this facility being seen for the management of her chronic illnesses; Diabetic peripheral neuropathy associated with type 2 diabetes mellitus:      Fibromyalgia:      Rheumatoid arthritis:   Post menopausal osteoporosis. There are no reports of uncontrolled pain; no reports of changes in appetite; no reports of anxiety.   Past Medical History:  Diagnosis Date  . Anemia   . Atrial fibrillation ([place])   . Back pain   . Bacterial pneumonia   . Cancer (HCC)    Skin   . Chronic respiratory failure ([place])   . Cognitive communication deficit   . COPD (chronic obstructive pulmonary disease) ([place])   . Dementia ([place])   . Depression   . Diabetes mellitus without complication ([place])   . Diverticulosis   . Fatty liver   . Fibromyalgia   . Gastric polyp 09/08/11   at the anastomosis-inflammatory  . Generalized anxiety disorder   . GERD (gastroesophageal reflux disease)   . 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 Hypercholesteremia   . Hypertension   . Insomnia   . Internal hemorrhoids   . Muscle weakness   . Neuropathy   . On home O2    qhs  . Osteoporosis   . Rheumatoid arteritis ([place])   . Ulcer    stomach  . Vitamin D deficiency     Past Surgical History:  Procedure Laterality Date  . ABDOMINAL HYSTERECTOMY    . ABDOMINAL SURGERY     removed patial  stomach from ulcer  . COLONOSCOPY  04-2001   internal hemorrhoids, panocolonic diverticulosis, and colon polyps   . UPPER GASTROINTESTINAL ENDOSCOPY      Social History   Socioeconomic History  . Marital status: Widowed    Spouse name: Not on file  . Number of children: 6  . Years of education: Not on file  . Highest education level: Not on file  Occupational History  . Occupation: Retired  Tobacco Use  . Smoking status: Never Smoker  . Smokeless tobacco: Never Used  Vaping Use  . Vaping Use: Never used  Substance and Sexual Activity  . Alcohol use: No  . Drug use: No  . Sexual activity: Not Currently  Other Topics Concern  . Not on file  Social History Narrative   Long term resident of [facility]    Social Determinants of Health   Financial Resource Strain: Not on file  Food Insecurity: Not on file  Transportation Needs: Not on file  Physical Activity: Not on file  Stress: Not on file  Social Connections: Not on file  Intimate Partner Violence: Not on file   Family History  Problem Relation Age of Onset  .  Heart disease Father   . Kidney disease Mother        kidney cancer  . Colon cancer Daughter 74      VITAL SIGNS BP (!) 159/76   Pulse 80   Temp 97.6 F (36.4 C)   Resp 18   Ht 5\' 3"  (1.6 m)   Wt 214 lb 12.8 oz (97.4 kg)   SpO2 94%   BMI 38.05 kg/m   Outpatient Encounter Medications as of 08/07/2020  Medication Sig  . acetaminophen (TYLENOL) 650 MG CR tablet Take 650 mg by mouth every 6 (six) hours.  Marland Kitchen apixaban (ELIQUIS) 2.5 MG TABS tablet Take 2.5 mg by mouth 2 (two) times daily.  Marland Kitchen denosumab (PROLIA) 60 MG/ML SOSY injection Inject 60 mg into the skin every 6 (six) months.  . diltiazem (CARDIZEM CD) 300 MG 24 hr capsule Take 300 mg by mouth daily.  Marland Kitchen donepezil (ARICEPT) 5 MG tablet Take 10 mg by mouth at bedtime.  . DULoxetine (CYMBALTA) 20 MG capsule Take 40 mg by mouth daily.   . Fluticasone-Umeclidin-Vilant (TRELEGY ELLIPTA) 100-62.5-25 MCG/INH AEPB  One puff inhalation once a day  . folic acid (FOLVITE) 1 MG tablet Take 5 mg by mouth as directed. Once a day on Sunday  . furosemide (LASIX) 40 MG tablet Take 40 mg by mouth 2 (two) times daily. Take 40 mg by mouth once a day from 06/08/2020-06/10/2020.  Marland Kitchen guaiFENesin (MUCINEX) 600 MG 12 hr tablet Take 600 mg by mouth 2 (two) times daily.  . Menthol, Topical Analgesic, (BIOFREEZE) 4 % GEL Apply to bilateral  shoulders Once A Day PRN  . Menthol, Topical Analgesic, 4 % GEL Apply to both knees  daily as needed  . methotrexate (RHEUMATREX) 2.5 MG tablet Take 12.5 mg by mouth once a week. On Sunday - Caution:Chemotherapy. Protect from light.  . miconazole (ZEASORB-AF) 2 % powder Apply 1 application topically 2 (two) times daily.  . NON FORMULARY Diet Type:  NAS,  Consistent Carbohydrates, Dysphagia 3  . ondansetron (ZOFRAN) 4 MG tablet Take 4 mg by mouth every 6 (six) hours as needed for nausea or vomiting.  . OXYGEN Inhale 3.5 L/min into the lungs continuous.  . pantoprazole (PROTONIX) 40 MG tablet Take 40 mg by mouth 2 (two) times daily.  . phenylephrine-shark liver oil-mineral oil-petrolatum (PREPARATION H) 0.25-3-14-71.9 % rectal ointment Place 1 application rectally 2 (two) times daily as needed for hemorrhoids.  . polyethylene glycol (MIRALAX / GLYCOLAX) packet Take 17 g by mouth daily as needed for moderate constipation.  . potassium chloride SA (K-DUR,KLOR-CON) 20 MEQ tablet Take 40 mEq by mouth 2 (two) times daily. On 06/08/2020- 06/10/2020 take 20 mg by mouth once a day  . pregabalin (LYRICA) 50 MG capsule Take 1 capsule by mouth  Twice daily  . traMADol (ULTRAM) 50 MG tablet Take 0.5 tablets (25 mg total) by mouth at bedtime.  . vitamin B-12 (CYANOCOBALAMIN) 1000 MCG tablet Take 1,000 mcg by mouth daily. 8 am for b12 level of 388   No facility-administered encounter medications on file as of 08/07/2020.     SIGNIFICANT DIAGNOSTIC EXAMS  PREVIOUS;    01-20-19: DEXA: t score  -4.997  05-10-20: chest x-ray: interstitial edema peribronchial cuffing; cardiomegaly; pulmonary vascular redistribution  NO NEW EXAMS. Marland Kitchen    LABS REVIEWED PREVIOUS :   08-15-19: urine micro-albumin 5.0  12-05-19: wbc 6.8; hgb 10.6; hct 36.9; mcv 87.9 plt 104; glucose 188; bun 13; creat 1.04; k+ 3.9; na++ 136; ca 9.2  liver normal albumin 2.8 hgb a1c 7.0 urine micro-albumin <3.0 01-13-20: glucose 224; bun 14; creat 1.21; k+ 4.3; na++ 137; ca 9.4 BNP 144.0 01-20-20: glucose 152; bun 14; creat 1.03 ;k+ 4.2; na++ 140 ca 9.9 03-02-20: glucose bun 20; creat 1.19 ;k+ 4.0; na++ 138; ca 9.6 05-12-20: wbc 4.6; hgb 9.9; hct 35.5; mcv 85.1 plt 102; glucose 185; bun 19; creat 1.19 ;k+ 3.7; na++ 142; ca 9.8; GFR44 vit B 12: 388; folate 20.3 06-06-20: wbc 11.1; hgb 10.7; hct 38.4 mcv 84.6 plt 92; glucose 237; bun 16; creat 1.32; k+ 3.4; na++ 137; ca 9.4 GFR38; urine culture multiple species 06-08-20: wbc 8.5; hgb 9.2; hct 32.8; mcv 84.8 plt 96; glucose 251; bun 17; creat 1.25; k+ 3.6; na++ 137; ca 9.4 GFR41 06-11-20: glucose 189; bun 16; creat 1.27; k+ 3.6; na++ 136; ca 9.4 GFR 40 06-18-20: hgb a1c 7.4   NO NEW LABS.   Review of Systems  Constitutional: Negative for malaise/fatigue.  Respiratory: Negative for cough and shortness of breath.   Cardiovascular: Negative for chest pain, palpitations and leg swelling.  Gastrointestinal: Negative for abdominal pain, constipation and heartburn.  Musculoskeletal: Negative for back pain, joint pain and myalgias.  Skin: Negative.   Neurological: Negative for dizziness.  Psychiatric/Behavioral: The patient is not nervous/anxious.      Physical Exam Constitutional:      General: She is not in acute distress.    Appearance: She is well-developed. She is not diaphoretic.  Neck:     Thyroid: No thyromegaly.  Cardiovascular:     Rate and Rhythm: Normal rate and regular rhythm.     Pulses: Normal pulses.     Heart sounds: Normal heart sounds.  Pulmonary:     Effort:  Pulmonary effort is normal. No respiratory distress.     Breath sounds: Normal breath sounds.  Abdominal:     General: Bowel sounds are normal. There is no distension.     Palpations: Abdomen is soft.     Tenderness: There is no abdominal tenderness.  Musculoskeletal:     Cervical back: Neck supple.     Right lower leg: Edema present.     Left lower leg: Edema present.     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  Skin:    General: Skin is warm and dry.  Neurological:     Mental Status: She is alert. Mental status is at baseline.  Psychiatric:        Mood and Affect: Mood normal.    ASSESSMENT/ PLAN:  TODAY:   1. Diabetic peripheral neuropathy associated with type 2 diabetes mellitus: is stable will continue lyrica 50 mg twice daily   2. Fibromyalgia: is stable will continue lyrica 50 mg twice daily and cymbalta 40 mg daily   3. Rheumatoid arthritis: is stable will continue methotrexate 12.5 mg weekly and folic acid  4. Post menopausal osteoporosis: is stable t score -4.997 (11-11-20-18) will continue prolia 60 mg every 6 months.    PREVIOUS   5. Chronic constipation: is stable will continue miralax daily as needed  6. Type 2 diabetes mellitus with peripheral neuropathy: is stable hgb a1c 7.4 is off tradjenta is on eliquis  7. Dyslipidemia associated with type 2 diabetes mellitus: is stable LDL 68 is off crestor will monitor   8. Atrial fibrillation with RVR  is stable will continue cardizem cd 300 mg daily for rate control and eliquis 2.5 mg twice daily   9. Chronic diastolic heart failure EF 55-60% (07-28-16) will continue lasix 40 mg twice daily with k+ 40 meq daily will monitor  10. Aortic atherosclerosis (cxr 03-09-19) will monitor   11. Hypokalemia is stable k+ 3.6 will continue k+ 40 meq daily  12.  Unspecified COPD/ chronic respiratory failure with hypoxia is stable is 02 dependent will continue trelegy ellipta 100-62.5-25 mcg 1 puff daily   13. GERD without esophagitis: is stable will continue protonix 40 mg twice daily   14. Vascular dementia without behavioral disturbance is stable weight is 218 pounds will continue aricept 5 mg daily   15. CKD stage 3 due to type 2 diabetes mellitus: is stable bun 16; creat 1.26 will monitor       Ok Edwards NP [facility]  Contact [phone] Monday through Friday 8am- 5pm  After hours call [phone]

## 2020-08-12 ENCOUNTER — Encounter: Payer: Self-pay | Admitting: Adult Health

## 2020-08-12 ENCOUNTER — Non-Acute Institutional Stay (SKILLED_NURSING_FACILITY): Payer: Medicare Other | Admitting: Adult Health

## 2020-08-12 DIAGNOSIS — J449 Chronic obstructive pulmonary disease, unspecified: Secondary | ICD-10-CM | POA: Diagnosis not present

## 2020-08-12 DIAGNOSIS — M059 Rheumatoid arthritis with rheumatoid factor, unspecified: Secondary | ICD-10-CM

## 2020-08-12 NOTE — Progress Notes (Signed)
Location:  [place] Room Number: 106-D Place of Service:  SNF (31)   CODE STATUS: DNR  Allergies  Allergen Reactions  . Other     Band Aids   . Tape   . Lipitor [Atorvastatin Calcium] Rash and Other (See Comments)    Muscle pain  . Niaspan [Niacin Er] Rash and Other (See Comments)    Muscle pain    Chief Complaint  Patient presents with  . Acute Visit    Back pain     HPI:  She is complaining of back pain thoracic area to her sacrum area with radiation present right >left side. States her current regimen is not effective in managing her pain. She is complaining of cough with increased shortness of breath with thick white/brown sputum present. There are no reports of fevers present.   Past Medical History:  Diagnosis Date  . Anemia   . Atrial fibrillation ([place])   . Back pain   . Bacterial pneumonia   . Cancer (HCC)    Skin   . Chronic respiratory failure ([place])   . Cognitive communication deficit   . COPD (chronic obstructive pulmonary disease) ([place])   . Dementia ([place])   . Depression   . Diabetes mellitus without complication ([place])   . Diverticulosis   . Fatty liver   . Fibromyalgia   . Gastric polyp 09/08/11   at the anastomosis-inflammatory  . Generalized anxiety disorder   . GERD (gastroesophageal reflux disease)   . 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 Hypercholesteremia   . Hypertension   . Insomnia   . Internal hemorrhoids   . Muscle weakness   . Neuropathy   . On home O2    qhs  . Osteoporosis   . Rheumatoid arteritis ([place])   . Ulcer    stomach  . Vitamin D deficiency     Past Surgical History:  Procedure Laterality Date  . ABDOMINAL HYSTERECTOMY    . ABDOMINAL SURGERY     removed patial stomach from ulcer  . COLONOSCOPY  04-2001   internal hemorrhoids, panocolonic diverticulosis, and colon polyps   . UPPER GASTROINTESTINAL ENDOSCOPY      Social History   Socioeconomic History  . Marital status: Widowed     Spouse name: Not on file  . Number of children: 6  . Years of education: Not on file  . Highest education level: Not on file  Occupational History  . Occupation: Retired  Tobacco Use  . Smoking status: Never Smoker  . Smokeless tobacco: Never Used  Vaping Use  . Vaping Use: Never used  Substance and Sexual Activity  . Alcohol use: No  . Drug use: No  . Sexual activity: Not Currently  Other Topics Concern  . Not on file  Social History Narrative   Long term resident of [facility]    Social Determinants of Health   Financial Resource Strain: Not on file  Food Insecurity: Not on file  Transportation Needs: Not on file  Physical Activity: Not on file  Stress: Not on file  Social Connections: Not on file  Intimate Partner Violence: Not on file   Family History  Problem Relation Age of Onset  . Heart disease Father   . Kidney disease Mother        kidney cancer  . Colon cancer Daughter 43      VITAL SIGNS BP (!) 158/79   Pulse 84   Temp 98 F (36.7 C)   Resp  18   Ht 5\' 3"  (1.6 m)   Wt 214 lb (97.1 kg)   SpO2 95%   BMI 37.91 kg/m   Outpatient Encounter Medications as of 08/12/2020  Medication Sig  . acetaminophen (TYLENOL) 650 MG CR tablet Take 650 mg by mouth every 6 (six) hours.  Marland Kitchen apixaban (ELIQUIS) 2.5 MG TABS tablet Take 2.5 mg by mouth 2 (two) times daily.  Marland Kitchen denosumab (PROLIA) 60 MG/ML SOSY injection Inject 60 mg into the skin every 6 (six) months.  . diltiazem (CARDIZEM CD) 300 MG 24 hr capsule Take 300 mg by mouth daily.  Marland Kitchen donepezil (ARICEPT) 10 MG tablet Take 10 mg by mouth at bedtime. For unspecified dementia without behavioral disturbance]  . DULoxetine (CYMBALTA) 20 MG capsule Take 40 mg by mouth daily.   . Fluticasone-Umeclidin-Vilant (TRELEGY ELLIPTA) 100-62.5-25 MCG/INH AEPB One puff inhalation once a day  . folic acid (FOLVITE) 1 MG tablet Take 5 mg by mouth as directed. Once a day on Sunday  . furosemide (LASIX) 40 MG tablet Take 40 mg by mouth 2  (two) times daily.  Marland Kitchen guaiFENesin (MUCINEX) 600 MG 12 hr tablet Take 600 mg by mouth 2 (two) times daily.  . Menthol, Topical Analgesic, (BIOFREEZE) 4 % GEL Apply to bilateral  shoulders Once A Day PRN  . Menthol, Topical Analgesic, 4 % GEL Apply to both knees  daily as needed  . methotrexate (RHEUMATREX) 2.5 MG tablet Take 12.5 mg by mouth once a week. On Sunday - Caution:Chemotherapy. Protect from light.  . miconazole (ZEASORB-AF) 2 % powder Apply 1 application topically 2 (two) times daily as needed.  . NON FORMULARY Diet Type:  NAS,  Consistent Carbohydrates, Dysphagia 3  . ondansetron (ZOFRAN) 4 MG tablet Take 4 mg by mouth every 6 (six) hours as needed for nausea or vomiting.  . OXYGEN Inhale 3.5 L/min into the lungs continuous.  . pantoprazole (PROTONIX) 40 MG tablet Take 40 mg by mouth 2 (two) times daily.  . phenylephrine-shark liver oil-mineral oil-petrolatum (PREPARATION H) 0.25-3-14-71.9 % rectal ointment Place 1 application rectally 2 (two) times daily as needed for hemorrhoids.  . polyethylene glycol (MIRALAX / GLYCOLAX) packet Take 17 g by mouth daily as needed for moderate constipation.  . potassium chloride SA (K-DUR,KLOR-CON) 20 MEQ tablet Take 40 mEq by mouth 2 (two) times daily. On 06/08/2020- 06/10/2020 take 20 mg by mouth once a day  . pregabalin (LYRICA) 50 MG capsule Take 1 capsule by mouth  Twice daily  . traMADol (ULTRAM) 50 MG tablet Take 0.5 tablets (25 mg total) by mouth at bedtime.  . vitamin B-12 (CYANOCOBALAMIN) 1000 MCG tablet Take 1,000 mcg by mouth daily. 8 am for b12 level of 388  . donepezil (ARICEPT) 5 MG tablet Take 10 mg by mouth at bedtime.   No facility-administered encounter medications on file as of 08/12/2020.     SIGNIFICANT DIAGNOSTIC EXAMS   PREVIOUS;    01-20-19: DEXA: t score -4.997  05-10-20: chest x-ray: interstitial edema peribronchial cuffing; cardiomegaly; pulmonary vascular redistribution  NO NEW EXAMS. Marland Kitchen    LABS REVIEWED PREVIOUS :    08-15-19: urine micro-albumin 5.0  12-05-19: wbc 6.8; hgb 10.6; hct 36.9; mcv 87.9 plt 104; glucose 188; bun 13; creat 1.04; k+ 3.9; na++ 136; ca 9.2 liver normal albumin 2.8 hgb a1c 7.0 urine micro-albumin <3.0 01-13-20: glucose 224; bun 14; creat 1.21; k+ 4.3; na++ 137; ca 9.4 BNP 144.0 01-20-20: glucose 152; bun 14; creat 1.03 ;k+ 4.2; na++ 140 ca  9.9 03-02-20: glucose bun 20; creat 1.19 ;k+ 4.0; na++ 138; ca 9.6 05-12-20: wbc 4.6; hgb 9.9; hct 35.5; mcv 85.1 plt 102; glucose 185; bun 19; creat 1.19 ;k+ 3.7; na++ 142; ca 9.8; GFR44 vit B 12: 388; folate 20.3 06-06-20: wbc 11.1; hgb 10.7; hct 38.4 mcv 84.6 plt 92; glucose 237; bun 16; creat 1.32; k+ 3.4; na++ 137; ca 9.4 GFR38; urine culture multiple species 06-08-20: wbc 8.5; hgb 9.2; hct 32.8; mcv 84.8 plt 96; glucose 251; bun 17; creat 1.25; k+ 3.6; na++ 137; ca 9.4 GFR41 06-11-20: glucose 189; bun 16; creat 1.27; k+ 3.6; na++ 136; ca 9.4 GFR 40 06-18-20: hgb a1c 7.4   NO NEW LABS.   Review of Systems  Constitutional: Positive for malaise/fatigue.  HENT: Negative for congestion and sore throat.   Respiratory: Positive for cough, sputum production and shortness of breath.   Cardiovascular: Negative for chest pain, palpitations and leg swelling.  Gastrointestinal: Negative for abdominal pain, constipation and heartburn.  Musculoskeletal: Positive for back pain. Negative for joint pain and myalgias.  Skin: Negative.   Neurological: Negative for dizziness.  Psychiatric/Behavioral: The patient is not nervous/anxious.     Physical Exam Constitutional:      General: She is not in acute distress.    Appearance: She is well-developed. She is not diaphoretic.  Neck:     Thyroid: No thyromegaly.  Cardiovascular:     Rate and Rhythm: Normal rate and regular rhythm.     Pulses: Normal pulses.     Heart sounds: Normal heart sounds.  Pulmonary:     Effort: Pulmonary effort is normal. No respiratory distress.     Breath sounds: Wheezing and rhonchi  present.  Abdominal:     General: Bowel sounds are normal. There is no distension.     Palpations: Abdomen is soft.     Tenderness: There is no abdominal tenderness.  Musculoskeletal:     Cervical back: Neck supple.     Right lower leg: Edema present.     Left lower leg: Edema present.     Comments: 2-3+ bilateral lower extremity edema  Unable to move left upper extremity due to frozen left shoulder Able to move other extremities  Lymphadenopathy:     Cervical: No cervical adenopathy.  Skin:    General: Skin is warm and dry.  Neurological:     Mental Status: She is alert. Mental status is at baseline.  Psychiatric:        Mood and Affect: Mood normal.       ASSESSMENT/ PLAN:  TODAY  1. Rheumatoid arthritis with positive rheumatoid factor unspecified site 2. Chronic obstructive pulmonary disease unspecified COPD type Is worse Will increase methotrexate to 15 mg weekly  Will begin prednisone taper: 40 mg; 30 mg; 20 mg 10 mg Will check cbc;cmp on 09-03-20    Ok Edwards NP [facility]  Contact [phone] Monday through Friday 8am- 5pm  After hours call [phone]

## 2020-08-13 ENCOUNTER — Other Ambulatory Visit: Payer: Self-pay | Admitting: Adult Health

## 2020-08-13 MED ORDER — TRAMADOL HCL 50 MG PO TABS: 25.0000 mg | ORAL_TABLET | Freq: Every day | ORAL | 0 refills | Status: DC

## 2020-08-14 ENCOUNTER — Non-Acute Institutional Stay (SKILLED_NURSING_FACILITY): Payer: Medicare Other | Admitting: Adult Health

## 2020-08-14 ENCOUNTER — Encounter: Payer: Self-pay | Admitting: Adult Health

## 2020-08-14 DIAGNOSIS — J189 Pneumonia, unspecified organism: Secondary | ICD-10-CM | POA: Diagnosis not present

## 2020-08-14 DIAGNOSIS — I5032 Chronic diastolic (congestive) heart failure: Secondary | ICD-10-CM

## 2020-08-14 NOTE — Progress Notes (Signed)
Location:  [place] Room Number: 106-D Place of Service:  SNF (31)   CODE STATUS: DNR  Allergies  Allergen Reactions  . Other     Band Aids   . Tape   . Lipitor [Atorvastatin Calcium] Rash and Other (See Comments)    Muscle pain  . Niaspan [Niacin Er] Rash and Other (See Comments)    Muscle pain    Chief Complaint  Patient presents with  . Acute Visit    Weight gain.    HPI:  She is gaining weight: 07-13-20:  214.8 pounds to her weight on 08-12-20: 233.2 pounds. She is complaining of increased wheezing has a cough with shortness of breath. No sputum production; no fevers. She has a history of CHF.   Past Medical History:  Diagnosis Date  . Anemia   . Atrial fibrillation ([place])   . Back pain   . Bacterial pneumonia   . Cancer (HCC)    Skin   . Chronic respiratory failure ([place])   . Cognitive communication deficit   . COPD (chronic obstructive pulmonary disease) ([place])   . Dementia ([place])   . Depression   . Diabetes mellitus without complication ([place])   . Diverticulosis   . Fatty liver   . Fibromyalgia   . Gastric polyp 09/08/11   at the anastomosis-inflammatory  . Generalized anxiety disorder   . GERD (gastroesophageal reflux disease)   . 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 Hypercholesteremia   . Hypertension   . Insomnia   . Internal hemorrhoids   . Muscle weakness   . Neuropathy   . On home O2    qhs  . Osteoporosis   . Rheumatoid arteritis ([place])   . Ulcer    stomach  . Vitamin D deficiency     Past Surgical History:  Procedure Laterality Date  . ABDOMINAL HYSTERECTOMY    . ABDOMINAL SURGERY     removed patial stomach from ulcer  . COLONOSCOPY  04-2001   internal hemorrhoids, panocolonic diverticulosis, and colon polyps   . UPPER GASTROINTESTINAL ENDOSCOPY      Social History   Socioeconomic History  . Marital status: Widowed    Spouse name: Not on file  . Number of children: 6  . Years of education: Not on file   . Highest education level: Not on file  Occupational History  . Occupation: Retired  Tobacco Use  . Smoking status: Never Smoker  . Smokeless tobacco: Never Used  Vaping Use  . Vaping Use: Never used  Substance and Sexual Activity  . Alcohol use: No  . Drug use: No  . Sexual activity: Not Currently  Other Topics Concern  . Not on file  Social History Narrative   Long term resident of [facility]    Social Determinants of Health   Financial Resource Strain: Not on file  Food Insecurity: Not on file  Transportation Needs: Not on file  Physical Activity: Not on file  Stress: Not on file  Social Connections: Not on file  Intimate Partner Violence: Not on file   Family History  Problem Relation Age of Onset  . Heart disease Father   . Kidney disease Mother        kidney cancer  . Colon cancer Daughter 70      VITAL SIGNS BP (!) 158/79   Pulse 84   Temp 98.4 F (36.9 C)   Resp 18   Ht 5\' 3"  (1.6 m)   Wt 233 lb  3.2 oz (105.8 kg)   SpO2 98%   BMI 41.31 kg/m   Outpatient Encounter Medications as of 08/14/2020  Medication Sig  . acetaminophen (TYLENOL) 650 MG CR tablet Take 650 mg by mouth every 6 (six) hours.  Marland Kitchen apixaban (ELIQUIS) 2.5 MG TABS tablet Take 2.5 mg by mouth 2 (two) times daily.  Marland Kitchen denosumab (PROLIA) 60 MG/ML SOSY injection Inject 60 mg into the skin every 6 (six) months.  . diltiazem (CARDIZEM CD) 300 MG 24 hr capsule Take 300 mg by mouth daily.  Marland Kitchen donepezil (ARICEPT) 10 MG tablet Take 10 mg by mouth at bedtime. For unspecified dementia without behavioral disturbance]  . DOXYCYCLINE HYCLATE PO Take 100 mg by mouth 2 (two) times daily.  . DULoxetine (CYMBALTA) 20 MG capsule Take 40 mg by mouth daily.   . Fluticasone-Umeclidin-Vilant (TRELEGY ELLIPTA) 100-62.5-25 MCG/INH AEPB One puff inhalation once a day  . folic acid (FOLVITE) 1 MG tablet Take 5 mg by mouth as directed. Once a day on Sunday  . furosemide (LASIX) 40 MG tablet Take 40 mg by mouth 2 (two) times  daily.  Marland Kitchen guaiFENesin (MUCINEX) 600 MG 12 hr tablet Take 600 mg by mouth 2 (two) times daily.  . methotrexate (RHEUMATREX) 2.5 MG tablet Take 12.5 mg by mouth once a week. On Sunday - Caution:Chemotherapy. Protect from light.  . miconazole (ZEASORB-AF) 2 % powder Apply 1 application topically 2 (two) times daily as needed.  . NON FORMULARY Diet Type:  NAS,  Consistent Carbohydrates, Dysphagia 3  . ondansetron (ZOFRAN) 4 MG tablet Take 4 mg by mouth every 6 (six) hours as needed for nausea or vomiting.  . OXYGEN Inhale 3.5 L/min into the lungs continuous.  . pantoprazole (PROTONIX) 40 MG tablet Take 40 mg by mouth 2 (two) times daily.  . phenylephrine-shark liver oil-mineral oil-petrolatum (PREPARATION H) 0.25-3-14-71.9 % rectal ointment Place 1 application rectally 2 (two) times daily as needed for hemorrhoids.  . polyethylene glycol (MIRALAX / GLYCOLAX) packet Take 17 g by mouth daily as needed for moderate constipation.  . potassium chloride SA (K-DUR,KLOR-CON) 20 MEQ tablet Take 40 mEq by mouth 2 (two) times daily. On 06/08/2020- 06/10/2020 take 20 mg by mouth once a day  . pregabalin (LYRICA) 50 MG capsule Take 1 capsule by mouth  Twice daily  . traMADol (ULTRAM) 50 MG tablet Take 0.5 tablets (25 mg total) by mouth at bedtime.  . vitamin B-12 (CYANOCOBALAMIN) 1000 MCG tablet Take 1,000 mcg by mouth daily. 8 am for b12 level of 388  . [DISCONTINUED] donepezil (ARICEPT) 5 MG tablet Take 10 mg by mouth at bedtime.  . [DISCONTINUED] Menthol, Topical Analgesic, (BIOFREEZE) 4 % GEL Apply to bilateral  shoulders Once A Day PRN  . [DISCONTINUED] Menthol, Topical Analgesic, 4 % GEL Apply to both knees  daily as needed   No facility-administered encounter medications on file as of 08/14/2020.     SIGNIFICANT DIAGNOSTIC EXAMS   PREVIOUS;    01-20-19: DEXA: t score -4.997  05-10-20: chest x-ray: interstitial edema peribronchial cuffing; cardiomegaly; pulmonary vascular redistribution  NO NEW EXAMS.  Marland Kitchen    LABS REVIEWED PREVIOUS :   08-15-19: urine micro-albumin 5.0  12-05-19: wbc 6.8; hgb 10.6; hct 36.9; mcv 87.9 plt 104; glucose 188; bun 13; creat 1.04; k+ 3.9; na++ 136; ca 9.2 liver normal albumin 2.8 hgb a1c 7.0 urine micro-albumin <3.0 01-13-20: glucose 224; bun 14; creat 1.21; k+ 4.3; na++ 137; ca 9.4 BNP 144.0 01-20-20: glucose 152; bun 14; creat  1.03 ;k+ 4.2; na++ 140 ca 9.9 03-02-20: glucose bun 20; creat 1.19 ;k+ 4.0; na++ 138; ca 9.6 05-12-20: wbc 4.6; hgb 9.9; hct 35.5; mcv 85.1 plt 102; glucose 185; bun 19; creat 1.19 ;k+ 3.7; na++ 142; ca 9.8; GFR44 vit B 12: 388; folate 20.3 06-06-20: wbc 11.1; hgb 10.7; hct 38.4 mcv 84.6 plt 92; glucose 237; bun 16; creat 1.32; k+ 3.4; na++ 137; ca 9.4 GFR38; urine culture multiple species 06-08-20: wbc 8.5; hgb 9.2; hct 32.8; mcv 84.8 plt 96; glucose 251; bun 17; creat 1.25; k+ 3.6; na++ 137; ca 9.4 GFR41 06-11-20: glucose 189; bun 16; creat 1.27; k+ 3.6; na++ 136; ca 9.4 GFR 40 06-18-20: hgb a1c 7.4   NO NEW LABS.   Review of Systems  Constitutional: Negative for malaise/fatigue.  Respiratory: Positive for cough and wheezing. Negative for sputum production and shortness of breath.   Cardiovascular: Positive for leg swelling. Negative for chest pain and palpitations.  Gastrointestinal: Negative for abdominal pain, constipation and heartburn.  Musculoskeletal: Negative for back pain, joint pain and myalgias.  Skin: Negative.   Neurological: Negative for dizziness.  Psychiatric/Behavioral: The patient is not nervous/anxious.     Physical Exam Constitutional:      General: She is not in acute distress.    Appearance: She is well-developed. She is not diaphoretic.  Neck:     Thyroid: No thyromegaly.  Cardiovascular:     Rate and Rhythm: Normal rate and regular rhythm.     Heart sounds: Normal heart sounds.  Pulmonary:     Effort: Pulmonary effort is normal. No respiratory distress.     Comments: 02 dependent Breath sound diminished with  wheezing present.  Abdominal:     General: Bowel sounds are normal. There is no distension.     Palpations: Abdomen is soft.     Tenderness: There is no abdominal tenderness.  Musculoskeletal:        General: Normal range of motion.     Cervical back: Neck supple.     Right lower leg: Edema present.     Left lower leg: Edema present.     Comments: 3+ bilateral lower extremity edema   Lymphadenopathy:     Cervical: No cervical adenopathy.  Skin:    General: Skin is warm and dry.  Neurological:     Mental Status: She is alert. Mental status is at baseline.  Psychiatric:        Mood and Affect: Mood normal.        ASSESSMENT/ PLAN:  TODAY  1. HCAP (health care associated pneumonia) 2. Chronic diastolic congestive heart failure  Will begin lasix 40 mg three times daily Will complete prednisone taper on 08-15-20 Will begin k+ 40 meq 3 times daily  Is on doxycycline 100 mg twice daily through 08-19-20 Will get bmp 08-17-20 Will get 2-d echo Will monitor her status.    Ok Edwards NP [facility]  Contact [phone] Monday through Friday 8am- 5pm  After hours call [phone]

## 2020-08-17 DIAGNOSIS — I361 Nonrheumatic tricuspid (valve) insufficiency: Secondary | ICD-10-CM | POA: Diagnosis not present

## 2020-08-17 DIAGNOSIS — I2729 Other secondary pulmonary hypertension: Secondary | ICD-10-CM | POA: Diagnosis not present

## 2020-08-17 DIAGNOSIS — I517 Cardiomegaly: Secondary | ICD-10-CM | POA: Diagnosis not present

## 2020-08-17 DIAGNOSIS — I340 Nonrheumatic mitral (valve) insufficiency: Secondary | ICD-10-CM | POA: Diagnosis not present

## 2020-08-18 ENCOUNTER — Other Ambulatory Visit (HOSPITAL_COMMUNITY)
Admission: RE | Admit: 2020-08-18 | Discharge: 2020-08-18 | Disposition: A | Discharge status: Home / Self Care | Payer: Medicare Other | Source: Skilled Nursing Facility | Attending: Adult Health | Admitting: Adult Health

## 2020-08-18 DIAGNOSIS — I5032 Chronic diastolic (congestive) heart failure: Secondary | ICD-10-CM | POA: Diagnosis not present

## 2020-08-18 DIAGNOSIS — J189 Pneumonia, unspecified organism: Secondary | ICD-10-CM | POA: Diagnosis not present

## 2020-08-18 LAB — BASIC METABOLIC PANEL
Anion gap: 4 — ABNORMAL LOW (ref 5–15)
BUN: 19 mg/dL (ref 8–23)
CO2: 35 mmol/L — ABNORMAL HIGH (ref 22–32)
Calcium: 8.6 mg/dL — ABNORMAL LOW (ref 8.9–10.3)
Chloride: 100 mmol/L (ref 98–111)
Creatinine, Ser: 1.23 mg/dL — ABNORMAL HIGH (ref 0.44–1.00)
GFR, Estimated: 42 mL/min — ABNORMAL LOW (ref >60)
Glucose, Bld: 136 mg/dL — ABNORMAL HIGH (ref 70–99)
Potassium: 4 mmol/L (ref 3.5–5.1)
Sodium: 139 mmol/L (ref 135–145)

## 2020-08-19 ENCOUNTER — Non-Acute Institutional Stay (SKILLED_NURSING_FACILITY): Payer: Medicare Other | Admitting: Adult Health

## 2020-08-19 ENCOUNTER — Encounter: Payer: Self-pay | Admitting: Adult Health

## 2020-08-19 DIAGNOSIS — E785 Hyperlipidemia, unspecified: Secondary | ICD-10-CM

## 2020-08-19 DIAGNOSIS — E1142 Type 2 diabetes mellitus with diabetic polyneuropathy: Secondary | ICD-10-CM | POA: Diagnosis not present

## 2020-08-19 DIAGNOSIS — I4891 Unspecified atrial fibrillation: Secondary | ICD-10-CM | POA: Diagnosis not present

## 2020-08-19 DIAGNOSIS — K5909 Other constipation: Secondary | ICD-10-CM | POA: Diagnosis not present

## 2020-08-19 DIAGNOSIS — E1169 Type 2 diabetes mellitus with other specified complication: Secondary | ICD-10-CM | POA: Diagnosis not present

## 2020-08-19 NOTE — Progress Notes (Signed)
Location:  [place] Room Number: 106d Place of Service:  SNF (31)   CODE STATUS: dnr  Allergies  Allergen Reactions   Other     Band Aids    Tape    Lipitor [Atorvastatin Calcium] Rash and Other (See Comments)    Muscle pain   Niaspan [Niacin Er] Rash and Other (See Comments)    Muscle pain    Chief Complaint  Patient presents with   Medical Management of Chronic Issues        Chronic constipation:     Type 2 diabetes mellitus with peripheral neuropathy:    Dyslipidemia associated with type 2 diabetes mellitus:   . Atrial fibrillation with RVR    HPI:  She is a 87 year old long term resident of this facility being seen for the management of her chronic illnesses: Chronic constipation:     Type 2 diabetes mellitus with peripheral neuropathy:    Dyslipidemia associated with type 2 diabetes mellitus:   . Atrial fibrillation with RVR.  There are no reports of uncontrolled no changes in appetite; no reports of insomnia.   Past Medical History:  Diagnosis Date   Anemia    Atrial fibrillation (HCC)    Back pain    Bacterial pneumonia    Cancer ([place])    Skin    Chronic respiratory failure ([place])    Cognitive communication deficit    COPD (chronic obstructive pulmonary disease) (HCC)    Dementia ([place])    Depression    Diabetes mellitus without complication ([place])    Diverticulosis    Fatty liver    Fibromyalgia    Gastric polyp 09/08/11   at the anastomosis-inflammatory   Generalized anxiety disorder    GERD (gastroesophageal reflux disease)    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)    Hypercholesteremia    Hypertension    Insomnia    Internal hemorrhoids    Muscle weakness    Neuropathy    On home O2    qhs   Osteoporosis    Rheumatoid arteritis ([place])    Ulcer    stomach   Vitamin D deficiency     Past Surgical History:  Procedure Laterality Date   ABDOMINAL HYSTERECTOMY     ABDOMINAL SURGERY     removed patial stomach from ulcer    COLONOSCOPY  04-2001   internal hemorrhoids, panocolonic diverticulosis, and colon polyps    UPPER GASTROINTESTINAL ENDOSCOPY      Social History   Socioeconomic History   Marital status: Widowed    Spouse name: Not on file   Number of children: 6   Years of education: Not on file   Highest education level: Not on file  Occupational History   Occupation: Retired  Tobacco Use   Smoking status: Never Smoker   Smokeless tobacco: Never Used  Scientific laboratory technician Use: Never used  Substance and Sexual Activity   Alcohol use: No   Drug use: No   Sexual activity: Not Currently  Other Topics Concern   Not on file  Social History Narrative   Long term resident of [facility]    Social Determinants of Health   Financial Resource Strain: Not on file  Food Insecurity: Not on file  Transportation Needs: Not on file  Physical Activity: Not on file  Stress: Not on file  Social Connections: Not on file  Intimate Partner Violence: Not on file   Family History  Problem Relation Age of Onset  Heart disease Father    Kidney disease Mother        kidney cancer   Colon cancer Daughter 29      VITAL SIGNS BP 122/70   Pulse 75   Temp 98.1 F (36.7 C)   Resp 20   Ht 5\' 3"  (1.6 m)   Wt 233 lb 3.2 oz (105.8 kg)   SpO2 91%   BMI 41.31 kg/m   Outpatient Encounter Medications as of 08/19/2020  Medication Sig   acetaminophen (TYLENOL) 650 MG CR tablet Take 650 mg by mouth every 6 (six) hours.   apixaban (ELIQUIS) 2.5 MG TABS tablet Take 2.5 mg by mouth 2 (two) times daily.   denosumab (PROLIA) 60 MG/ML SOSY injection Inject 60 mg into the skin every 6 (six) months.   diltiazem (CARDIZEM CD) 300 MG 24 hr capsule Take 300 mg by mouth daily.   donepezil (ARICEPT) 10 MG tablet Take 10 mg by mouth at bedtime. For unspecified dementia without behavioral disturbance]   DOXYCYCLINE HYCLATE PO Take 100 mg by mouth 2 (two) times daily.   DULoxetine (CYMBALTA) 20 MG capsule Take 40 mg by mouth  daily.    Fluticasone-Umeclidin-Vilant (TRELEGY ELLIPTA) 100-62.5-25 MCG/INH AEPB One puff inhalation once a day   folic acid (FOLVITE) 1 MG tablet Take 5 mg by mouth as directed. Once a day on Sunday   furosemide (LASIX) 40 MG tablet Take 40 mg by mouth 2 (two) times daily.   guaiFENesin (MUCINEX) 600 MG 12 hr tablet Take 600 mg by mouth 2 (two) times daily.   methotrexate (RHEUMATREX) 2.5 MG tablet Take 15 mg by mouth once a week. On Sunday - Caution:Chemotherapy. Protect from light.   miconazole (ZEASORB-AF) 2 % powder Apply 1 application topically 2 (two) times daily as needed.   NON FORMULARY Diet Type:  NAS,  Consistent Carbohydrates, Dysphagia 3   ondansetron (ZOFRAN) 4 MG tablet Take 4 mg by mouth every 6 (six) hours as needed for nausea or vomiting.   OXYGEN Inhale 3.5 L/min into the lungs continuous.   pantoprazole (PROTONIX) 40 MG tablet Take 40 mg by mouth 2 (two) times daily.   phenylephrine-shark liver oil-mineral oil-petrolatum (PREPARATION H) 0.25-3-14-71.9 % rectal ointment Place 1 application rectally 2 (two) times daily as needed for hemorrhoids.   polyethylene glycol (MIRALAX / GLYCOLAX) packet Take 17 g by mouth daily as needed for moderate constipation.   potassium chloride SA (K-DUR,KLOR-CON) 20 MEQ tablet Take 40 mEq by mouth 2 (two) times daily. On 06/08/2020- 06/10/2020 take 20 mg by mouth once a day   pregabalin (LYRICA) 50 MG capsule Take 1 capsule by mouth  Twice daily   traMADol (ULTRAM) 50 MG tablet Take 0.5 tablets (25 mg total) by mouth at bedtime.   vitamin B-12 (CYANOCOBALAMIN) 1000 MCG tablet Take 1,000 mcg by mouth daily. 8 am for b12 level of 388   No facility-administered encounter medications on file as of 08/19/2020.     SIGNIFICANT DIAGNOSTIC EXAMS   PREVIOUS;    01-20-19: DEXA: t score -4.997  05-10-20: chest x-ray: interstitial edema peribronchial cuffing; cardiomegaly; pulmonary vascular redistribution  NO NEW EXAMS. Marland Kitchen    LABS REVIEWED PREVIOUS  :   08-15-19: urine micro-albumin 5.0  12-05-19: wbc 6.8; hgb 10.6; hct 36.9; mcv 87.9 plt 104; glucose 188; bun 13; creat 1.04; k+ 3.9; na++ 136; ca 9.2 liver normal albumin 2.8 hgb a1c 7.0 urine micro-albumin <3.0 01-13-20: glucose 224; bun 14; creat 1.21; k+ 4.3; na++ 137; ca  9.4 BNP 144.0 01-20-20: glucose 152; bun 14; creat 1.03 ;k+ 4.2; na++ 140 ca 9.9 03-02-20: glucose bun 20; creat 1.19 ;k+ 4.0; na++ 138; ca 9.6 05-12-20: wbc 4.6; hgb 9.9; hct 35.5; mcv 85.1 plt 102; glucose 185; bun 19; creat 1.19 ;k+ 3.7; na++ 142; ca 9.8; GFR44 vit B 12: 388; folate 20.3 06-06-20: wbc 11.1; hgb 10.7; hct 38.4 mcv 84.6 plt 92; glucose 237; bun 16; creat 1.32; k+ 3.4; na++ 137; ca 9.4 GFR38; urine culture multiple species 06-08-20: wbc 8.5; hgb 9.2; hct 32.8; mcv 84.8 plt 96; glucose 251; bun 17; creat 1.25; k+ 3.6; na++ 137; ca 9.4 GFR41 06-11-20: glucose 189; bun 16; creat 1.27; k+ 3.6; na++ 136; ca 9.4 GFR 40 06-18-20: hgb a1c 7.4   TODAY  08-18-20: glucose 136; bun 19; creat 1.23; k+ 4.0; na++ 139; ca 8.6; GFR 42  Review of Systems  Constitutional:  Negative for malaise/fatigue.  Respiratory:  Negative for cough and shortness of breath.   Cardiovascular:  Negative for chest pain, palpitations and leg swelling.  Gastrointestinal:  Negative for abdominal pain, constipation and heartburn.  Musculoskeletal:  Negative for back pain, joint pain and myalgias.  Skin: Negative.   Neurological:  Negative for dizziness.  Psychiatric/Behavioral:  The patient is not nervous/anxious.     Physical Exam Constitutional:      General: She is not in acute distress.    Appearance: She is well-developed. She is not diaphoretic.  Neck:     Thyroid: No thyromegaly.  Cardiovascular:     Rate and Rhythm: Normal rate and regular rhythm.     Pulses: Normal pulses.     Heart sounds: Normal heart sounds.  Pulmonary:     Effort: Pulmonary effort is normal. No respiratory distress.     Breath sounds: Normal breath sounds.      Comments:  02 dependent Breath sound diminished with wheezing present.  Abdominal:     General: Bowel sounds are normal. There is no distension.     Palpations: Abdomen is soft.     Tenderness: There is no abdominal tenderness.  Musculoskeletal:        General: Normal range of motion.     Cervical back: Neck supple.     Right lower leg: Edema present.     Left lower leg: Edema present.     Comments: 2-3+ bilateral lower extremity edema  Unable to move left upper extremity due to frozen left shoulder Able to move other extremities   Lymphadenopathy:     Cervical: No cervical adenopathy.  Skin:    General: Skin is warm and dry.  Neurological:     Mental Status: She is alert. Mental status is at baseline.  Psychiatric:        Mood and Affect: Mood normal.     ASSESSMENT/ PLAN:  TODAY:    1. Chronic constipation: : is stable will continue miralax daily as needed  2. Type 2 diabetes mellitus with peripheral neuropathy: is stable hgb a1c 7.5 is off tradjenta is on eliquis  3. Dyslipidemia associated with type 2 diabetes mellitus: is stable LDL 68 is off crestor will monitor   4. Atrial fibrillation with RVR is stable will continue cardizem cd 300 mg daily for rate control and eliquis 2.5 mg twice daily    PREVIOUS   5 Chronic diastolic heart failure EF 55-60% (07-28-16) will continue lasix 40 mg twice daily with k+ 40 meq daily will monitor  6. Aortic atherosclerosis (cxr 03-09-19) will monitor  7. Hypokalemia is stable k+ 3.6 will continue k+ 40 meq daily  8. Unspecified COPD/ chronic respiratory failure with hypoxia is stable is 02 dependent will continue trelegy ellipta 100-62.5-25 mcg 1 puff daily   9. GERD without esophagitis: is stable will continue protonix 40 mg twice daily   10. Vascular dementia without behavioral disturbance is stable weight is 218 pounds will continue aricept 5 mg daily   11. CKD stage 3 due to type 2 diabetes mellitus: is stable bun 16;  creat 1.26 will monitor   12. Diabetic peripheral neuropathy associated with type 2 diabetes mellitus: is stable will continue lyrica 50 mg twice daily   13. Fibromyalgia: is stable will continue lyrica 50 mg twice daily and cymbalta 40 mg daily   14. Rheumatoid arthritis: is stable will continue methotrexate 12.5 mg weekly and folic acid  15. Post menopausal osteoporosis: is stable t score -4.997 (11-11-20-18) will continue prolia 60 mg every 6 months.      Ok Edwards NP [facility]  Contact [phone] Monday through Friday 8am- 5pm  After hours call [phone]

## 2020-08-31 ENCOUNTER — Other Ambulatory Visit: Payer: Self-pay | Admitting: Adult Health

## 2020-08-31 MED ORDER — PREGABALIN 50 MG PO CAPS: ORAL_CAPSULE | ORAL | 0 refills | Status: DC

## 2020-09-03 ENCOUNTER — Other Ambulatory Visit (HOSPITAL_COMMUNITY)
Admission: RE | Admit: 2020-09-03 | Discharge: 2020-09-03 | Disposition: A | Discharge status: Home / Self Care | Payer: Medicare Other | Source: Skilled Nursing Facility | Attending: Adult Health | Admitting: Adult Health

## 2020-09-03 DIAGNOSIS — I130 Hypertensive heart and chronic kidney disease with heart failure and stage 1 through stage 4 chronic kidney disease, or unspecified chronic kidney disease: Secondary | ICD-10-CM | POA: Diagnosis not present

## 2020-09-03 LAB — CBC WITH DIFFERENTIAL/PLATELET
Abs Immature Granulocytes: 0.01 10*3/uL (ref 0.00–0.07)
Basophils Absolute: 0.1 10*3/uL (ref 0.0–0.1)
Basophils Relative: 1 %
Eosinophils Absolute: 0.3 10*3/uL (ref 0.0–0.5)
Eosinophils Relative: 7 %
HCT: 30.1 % — ABNORMAL LOW (ref 36.0–46.0)
Hemoglobin: 8.1 g/dL — ABNORMAL LOW (ref 12.0–15.0)
Immature Granulocytes: 0 %
Lymphocytes Relative: 32 %
Lymphs Abs: 1.5 10*3/uL (ref 0.7–4.0)
MCH: 21.7 pg — ABNORMAL LOW (ref 26.0–34.0)
MCHC: 26.9 g/dL — ABNORMAL LOW (ref 30.0–36.0)
MCV: 80.7 fL (ref 80.0–100.0)
Monocytes Absolute: 0.2 10*3/uL (ref 0.1–1.0)
Monocytes Relative: 4 %
Neutro Abs: 2.8 10*3/uL (ref 1.7–7.7)
Neutrophils Relative %: 56 %
Platelets: 98 10*3/uL — ABNORMAL LOW (ref 150–400)
RBC: 3.73 MIL/uL — ABNORMAL LOW (ref 3.87–5.11)
RDW: 19.9 % — ABNORMAL HIGH (ref 11.5–15.5)
WBC: 4.9 10*3/uL (ref 4.0–10.5)
nRBC: 0 % (ref 0.0–0.2)

## 2020-09-03 LAB — COMPREHENSIVE METABOLIC PANEL
ALT: 10 U/L (ref 0–44)
AST: 16 U/L (ref 15–41)
Albumin: 2.8 g/dL — ABNORMAL LOW (ref 3.5–5.0)
Alkaline Phosphatase: 54 U/L (ref 38–126)
Anion gap: 8 (ref 5–15)
BUN: 16 mg/dL (ref 8–23)
CO2: 28 mmol/L (ref 22–32)
Calcium: 8.6 mg/dL — ABNORMAL LOW (ref 8.9–10.3)
Chloride: 104 mmol/L (ref 98–111)
Creatinine, Ser: 1.18 mg/dL — ABNORMAL HIGH (ref 0.44–1.00)
GFR, Estimated: 44 mL/min — ABNORMAL LOW (ref >60)
Glucose, Bld: 136 mg/dL — ABNORMAL HIGH (ref 70–99)
Potassium: 3.5 mmol/L (ref 3.5–5.1)
Sodium: 140 mmol/L (ref 135–145)
Total Bilirubin: 0.8 mg/dL (ref 0.3–1.2)
Total Protein: 5.9 g/dL — ABNORMAL LOW (ref 6.5–8.1)

## 2020-09-09 DIAGNOSIS — I129 Hypertensive chronic kidney disease with stage 1 through stage 4 chronic kidney disease, or unspecified chronic kidney disease: Secondary | ICD-10-CM | POA: Diagnosis not present

## 2020-09-09 DIAGNOSIS — Z1159 Encounter for screening for other viral diseases: Secondary | ICD-10-CM | POA: Diagnosis not present

## 2020-09-09 DIAGNOSIS — J449 Chronic obstructive pulmonary disease, unspecified: Secondary | ICD-10-CM | POA: Diagnosis not present

## 2020-09-10 ENCOUNTER — Non-Acute Institutional Stay (SKILLED_NURSING_FACILITY): Payer: Medicare Other | Admitting: Adult Health

## 2020-09-10 ENCOUNTER — Encounter: Payer: Self-pay | Admitting: Adult Health

## 2020-09-10 DIAGNOSIS — I700 Atherosclerosis of aorta: Secondary | ICD-10-CM

## 2020-09-10 DIAGNOSIS — F0150 Vascular dementia without behavioral disturbance: Secondary | ICD-10-CM

## 2020-09-10 DIAGNOSIS — E1142 Type 2 diabetes mellitus with diabetic polyneuropathy: Secondary | ICD-10-CM | POA: Diagnosis not present

## 2020-09-10 DIAGNOSIS — J9611 Chronic respiratory failure with hypoxia: Secondary | ICD-10-CM | POA: Diagnosis not present

## 2020-09-10 NOTE — Progress Notes (Signed)
Location:  [facility] Room Number: 106-D Place of Service:  SNF (31)   CODE STATUS: DNR  Allergies  Allergen Reactions  . Other     Band Aids   . Tape   . Lipitor [Atorvastatin Calcium] Rash and Other (See Comments)    Muscle pain  . Niaspan [Niacin Er] Rash and Other (See Comments)    Muscle pain    Chief Complaint  Patient presents with  . Acute Visit    Care plan meeting.    HPI:  We have come together for her care plan meeting. BIMS 8/15 mood 2/30: anxious at times. Non-ambulatory she requires limited assist with adls. She does feed herself. She is occasionally incontinent of bladder and frequently incontinent of bowel. Cbg reading are stable; is on chronic 02. She does attend activities. She has had a weight loss to 213.6 pounds which is desirable. She does have a good appetite. She is not being followed by therapy at this time. There are no reports of uncontrolled pain. 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 Anemia   . Atrial fibrillation ([place])   . Back pain   . Bacterial pneumonia   . Cancer (HCC)    Skin   . Chronic respiratory failure ([place])   . Cognitive communication deficit   . COPD (chronic obstructive pulmonary disease) ([place])   . Dementia ([place])   . Depression   . Diabetes mellitus without complication ([place])   . Diverticulosis   . Fatty liver   . Fibromyalgia   . Gastric polyp 09/08/11   at the anastomosis-inflammatory  . Generalized anxiety disorder   . GERD (gastroesophageal reflux disease)   . 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 Hypercholesteremia   . Hypertension   . Insomnia   . Internal hemorrhoids   . Muscle weakness   . Neuropathy   . On home O2    qhs  .  Osteoporosis   . Rheumatoid arteritis ([place])   . Ulcer    stomach  . Vitamin D deficiency     Past Surgical History:  Procedure Laterality Date  . ABDOMINAL HYSTERECTOMY    . ABDOMINAL SURGERY     removed patial stomach from ulcer  . COLONOSCOPY  04-2001   internal hemorrhoids, panocolonic diverticulosis, and colon polyps   . UPPER GASTROINTESTINAL ENDOSCOPY      Social History   Socioeconomic History  . Marital status: Widowed    Spouse name: Not on file  . Number of children: 6  . Years of education: Not on file  . Highest education level: Not on file  Occupational History  . Occupation: Retired  Tobacco Use  . Smoking status: Never  . Smokeless tobacco: Never  Vaping Use  . Vaping Use: Never used  Substance and Sexual Activity  . Alcohol use: No  . Drug use: No  . Sexual activity: Not Currently  Other Topics Concern  . Not on file  Social History Narrative   Long term resident of [facility]    Social Determinants of Health   Financial Resource Strain: Not on file  Food Insecurity: Not on file  Transportation Needs: Not on file  Physical Activity: Not on file  Stress: Not on file  Social Connections: Not on file  Intimate Partner Violence: Not  on file   Family History  Problem Relation Age of Onset  . Heart disease Father   . Kidney disease Mother        kidney cancer  . Colon cancer Daughter 71      VITAL SIGNS BP (!) 156/77   Pulse 88   Temp 98 F (36.7 C)   Resp (!) 24   Ht 5\' 3"  (1.6 m)   Wt 213 lb 9.6 oz (96.9 kg)   SpO2 90%   BMI 37.84 kg/m   Outpatient Encounter Medications as of 09/10/2020  Medication Sig  . acetaminophen (TYLENOL) 650 MG CR tablet Take 650 mg by mouth every 6 (six) hours.  Marland Kitchen apixaban (ELIQUIS) 2.5 MG TABS tablet Take 2.5 mg by mouth 2 (two) times daily.  Marland Kitchen denosumab (PROLIA) 60 MG/ML SOSY injection Inject 60 mg into the skin every 6 (six) months.  . diltiazem (CARDIZEM CD) 300 MG 24 hr capsule Take 300 mg by mouth daily.   Marland Kitchen donepezil (ARICEPT) 10 MG tablet Take 10 mg by mouth at bedtime. For unspecified dementia without behavioral disturbance]  . DULoxetine (CYMBALTA) 20 MG capsule Take 40 mg by mouth daily.   . folic acid (FOLVITE) 1 MG tablet Take 5 mg by mouth as directed. Once a day on Sunday  . furosemide (LASIX) 40 MG tablet Take 40 mg by mouth 2 (two) times daily.  Marland Kitchen guaiFENesin (MUCINEX) 600 MG 12 hr tablet Take 600 mg by mouth 2 (two) times daily.  . methotrexate (RHEUMATREX) 2.5 MG tablet Take 15 mg by mouth once a week. On Sunday - Caution:Chemotherapy. Protect from light.  . miconazole (ZEASORB-AF) 2 % powder Apply 1 application topically 2 (two) times daily as needed.  . NON FORMULARY Diet Type:  NAS,  Consistent Carbohydrates, Dysphagia 3  . ondansetron (ZOFRAN) 4 MG tablet Take 4 mg by mouth every 6 (six) hours as needed for nausea or vomiting.  . OXYGEN Inhale 3.5 L/min into the lungs continuous.  . pantoprazole (PROTONIX) 40 MG tablet Take 40 mg by mouth 2 (two) times daily.  . phenylephrine-shark liver oil-mineral oil-petrolatum (PREPARATION H) 0.25-3-14-71.9 % rectal ointment Place 1 application rectally 2 (two) times daily as needed for hemorrhoids.  . polyethylene glycol (MIRALAX / GLYCOLAX) packet Take 17 g by mouth daily as needed for moderate constipation.  . potassium chloride SA (K-DUR,KLOR-CON) 20 MEQ tablet Take 40 mEq by mouth 2 (two) times daily. On 06/08/2020- 06/10/2020 take 20 mg by mouth once a day  . pregabalin (LYRICA) 50 MG capsule Take 1 capsule by mouth  Twice daily  . vitamin B-12 (CYANOCOBALAMIN) 1000 MCG tablet Take 1,000 mcg by mouth daily. 8 am for b12 level of 388  . Fluticasone-Umeclidin-Vilant (TRELEGY ELLIPTA) 100-62.5-25 MCG/INH AEPB One puff inhalation once a day  . traMADol (ULTRAM) 50 MG tablet Take 0.5 tablets (25 mg total) by mouth at bedtime.  . [DISCONTINUED] DOXYCYCLINE HYCLATE PO Take 100 mg by mouth 2 (two) times daily.   No facility-administered encounter  medications on file as of 09/10/2020.     SIGNIFICANT DIAGNOSTIC EXAMS  PREVIOUS;    01-20-19: DEXA: t score -4.997  05-10-20: chest x-ray: interstitial edema peribronchial cuffing; cardiomegaly; pulmonary vascular redistribution  NO NEW EXAMS. Marland Kitchen    LABS REVIEWED PREVIOUS :   08-15-19: urine micro-albumin 5.0  12-05-19: wbc 6.8; hgb 10.6; hct 36.9; mcv 87.9 plt 104; glucose 188; bun 13; creat 1.04; k+ 3.9; na++ 136; ca 9.2 liver normal albumin 2.8 hgb a1c  7.0 urine micro-albumin <3.0 01-13-20: glucose 224; bun 14; creat 1.21; k+ 4.3; na++ 137; ca 9.4 BNP 144.0 01-20-20: glucose 152; bun 14; creat 1.03 ;k+ 4.2; na++ 140 ca 9.9 03-02-20: glucose bun 20; creat 1.19 ;k+ 4.0; na++ 138; ca 9.6 05-12-20: wbc 4.6; hgb 9.9; hct 35.5; mcv 85.1 plt 102; glucose 185; bun 19; creat 1.19 ;k+ 3.7; na++ 142; ca 9.8; GFR44 vit B 12: 388; folate 20.3 06-06-20: wbc 11.1; hgb 10.7; hct 38.4 mcv 84.6 plt 92; glucose 237; bun 16; creat 1.32; k+ 3.4; na++ 137; ca 9.4 GFR38; urine culture multiple species 06-08-20: wbc 8.5; hgb 9.2; hct 32.8; mcv 84.8 plt 96; glucose 251; bun 17; creat 1.25; k+ 3.6; na++ 137; ca 9.4 GFR41 06-11-20: glucose 189; bun 16; creat 1.27; k+ 3.6; na++ 136; ca 9.4 GFR 40 06-18-20: hgb a1c 7.4  08-18-20: glucose 136; bun 19; creat 1.23; k+ 4.0; na++ 139; ca 8.6; GFR 42  NO NEW LABS.    Review of Systems  Constitutional:  Negative for malaise/fatigue.  Respiratory:  Negative for cough and shortness of breath.   Cardiovascular:  Negative for chest pain, palpitations and leg swelling.  Gastrointestinal:  Negative for abdominal pain, constipation and heartburn.  Musculoskeletal:  Negative for back pain, joint pain and myalgias.  Skin: Negative.   Neurological:  Negative for dizziness.  Psychiatric/Behavioral:  The patient is not nervous/anxious.    Physical Exam Constitutional:      General: She is not in acute distress.    Appearance: She is well-developed. She is not diaphoretic.  Neck:      Thyroid: No thyromegaly.  Cardiovascular:     Rate and Rhythm: Normal rate and regular rhythm.     Pulses: Normal pulses.     Heart sounds: Normal heart sounds.  Pulmonary:     Effort: Pulmonary effort is normal. No respiratory distress.     Comments:  02 dependent Breath sound diminished with wheezing present.  Abdominal:     General: Bowel sounds are normal. There is no distension.     Palpations: Abdomen is soft.     Tenderness: There is no abdominal tenderness.  Musculoskeletal:     Cervical back: Neck supple.     Right lower leg: Edema present.     Left lower leg: Edema present.     Comments: 2-3+ bilateral lower extremity edema Unable to move left upper extremity due to frozen left shoulder Able to move other extremities    Lymphadenopathy:     Cervical: No cervical adenopathy.  Skin:    General: Skin is warm and dry.  Neurological:     Mental Status: She is alert. Mental status is at baseline.  Psychiatric:        Mood and Affect: Mood normal.      ASSESSMENT/ PLAN:   TODAY  Aortic atherosclerosis Vascular dementia without behavioral disturbance Chronic respiratory failure with hypoxia Type 2 diabetes mellitus controlled with peripheral neuropathy  Will continue current medications Will continue current plan of care Will continue to monitor her status   Time spent with patient 40 minute;: plan of care; medications; dietary needs.   Ok Edwards NP [facility]  Contact [phone] Monday through Friday 8am- 5pm  After hours call [phone]

## 2020-09-11 ENCOUNTER — Other Ambulatory Visit: Payer: Self-pay | Admitting: Adult Health

## 2020-09-11 MED ORDER — TRAMADOL HCL 50 MG PO TABS: 25.0000 mg | ORAL_TABLET | Freq: Every day | ORAL | 0 refills | Status: DC

## 2020-09-15 DIAGNOSIS — I129 Hypertensive chronic kidney disease with stage 1 through stage 4 chronic kidney disease, or unspecified chronic kidney disease: Secondary | ICD-10-CM | POA: Diagnosis not present

## 2020-09-15 DIAGNOSIS — J449 Chronic obstructive pulmonary disease, unspecified: Secondary | ICD-10-CM | POA: Diagnosis not present

## 2020-09-15 DIAGNOSIS — Z1159 Encounter for screening for other viral diseases: Secondary | ICD-10-CM | POA: Diagnosis not present

## 2020-09-16 ENCOUNTER — Encounter: Payer: Self-pay | Admitting: Adult Health

## 2020-09-16 ENCOUNTER — Non-Acute Institutional Stay (SKILLED_NURSING_FACILITY): Payer: Medicare Other | Admitting: Adult Health

## 2020-09-16 DIAGNOSIS — I5032 Chronic diastolic (congestive) heart failure: Secondary | ICD-10-CM | POA: Diagnosis not present

## 2020-09-16 DIAGNOSIS — J449 Chronic obstructive pulmonary disease, unspecified: Secondary | ICD-10-CM | POA: Diagnosis not present

## 2020-09-16 DIAGNOSIS — E876 Hypokalemia: Secondary | ICD-10-CM | POA: Diagnosis not present

## 2020-09-16 DIAGNOSIS — I700 Atherosclerosis of aorta: Secondary | ICD-10-CM | POA: Diagnosis not present

## 2020-09-16 DIAGNOSIS — J9611 Chronic respiratory failure with hypoxia: Secondary | ICD-10-CM

## 2020-09-16 NOTE — Progress Notes (Signed)
Location:  [facility] Room Number: 106-D Place of Service:  SNF (31)   CODE STATUS: DNR  Allergies  Allergen Reactions  . Other     Band Aids   . Tape   . Lipitor [Atorvastatin Calcium] Rash and Other (See Comments)    Muscle pain  . Niaspan [Niacin Er] Rash and Other (See Comments)    Muscle pain    Chief Complaint  Patient presents with  . Medical Management of Chronic Issues             Chronic diastolic heart failure. Aortic atherosclerosis. Hypokalemia:  Unspecified COPD/chronic respiratory failure with hypoxia    HPI: Chronic diastolic heart failure. Aortic atherosclerosis. Hypokalemia:  Unspecified COPD/chronic respiratory failure with hypoxia She is a 87 year old long term resident of this facility being seen for the management of her chronic illnesses. There are no reports of uncontrolled pain; no changes in appetite. No reports of anxiety or agitation.    Past Medical History:  Diagnosis Date  . Anemia   . Atrial fibrillation ([place])   . Back pain   . Bacterial pneumonia   . Cancer (HCC)    Skin   . Chronic respiratory failure ([place])   . Cognitive communication deficit   . COPD (chronic obstructive pulmonary disease) ([place])   . Dementia ([place])   . Depression   . Diabetes mellitus without complication ([place])   . Diverticulosis   . Fatty liver   . Fibromyalgia   . Gastric polyp 09/08/11   at the anastomosis-inflammatory  . Generalized anxiety disorder   . GERD (gastroesophageal reflux disease)   . 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 Hypercholesteremia   . Hypertension   . Insomnia   . Internal hemorrhoids   . Muscle weakness   . Neuropathy   . On home O2    qhs  . Osteoporosis   . Rheumatoid arteritis ([place])   . Ulcer    stomach  . Vitamin D deficiency     Past Surgical History:  Procedure Laterality Date  . ABDOMINAL HYSTERECTOMY    . ABDOMINAL SURGERY     removed patial stomach from ulcer  . COLONOSCOPY  04-2001    internal hemorrhoids, panocolonic diverticulosis, and colon polyps   . UPPER GASTROINTESTINAL ENDOSCOPY      Social History   Socioeconomic History  . Marital status: Widowed    Spouse name: Not on file  . Number of children: 6  . Years of education: Not on file  . Highest education level: Not on file  Occupational History  . Occupation: Retired  Tobacco Use  . Smoking status: Never  . Smokeless tobacco: Never  Vaping Use  . Vaping Use: Never used  Substance and Sexual Activity  . Alcohol use: No  . Drug use: No  . Sexual activity: Not Currently  Other Topics Concern  . Not on file  Social History Narrative   Long term resident of [facility]    Social Determinants of Health   Financial Resource Strain: Not on file  Food Insecurity: Not on file  Transportation Needs: Not on file  Physical Activity: Not on file  Stress: Not on file  Social Connections: Not on file  Intimate Partner Violence: Not on file   Family History  Problem Relation Age of Onset  . Heart disease Father   . Kidney disease Mother        kidney cancer  . Colon cancer Daughter 41  VITAL SIGNS BP 140/67   Pulse 71   Temp 97.8 F (36.6 C)   Resp 16   Ht 5\' 3"  (1.6 m)   Wt 209 lb (94.8 kg)   SpO2 90%   BMI 37.02 kg/m   Outpatient Encounter Medications as of 09/16/2020  Medication Sig  . acetaminophen (TYLENOL) 650 MG CR tablet Take 650 mg by mouth every 6 (six) hours.  Marland Kitchen apixaban (ELIQUIS) 2.5 MG TABS tablet Take 2.5 mg by mouth 2 (two) times daily.  Marland Kitchen denosumab (PROLIA) 60 MG/ML SOSY injection Inject 60 mg into the skin every 6 (six) months.  . diltiazem (CARDIZEM CD) 300 MG 24 hr capsule Take 300 mg by mouth daily.  Marland Kitchen donepezil (ARICEPT) 10 MG tablet Take 10 mg by mouth at bedtime. For unspecified dementia without behavioral disturbance]  . DULoxetine (CYMBALTA) 20 MG capsule Take 40 mg by mouth daily.   . Fluticasone-Umeclidin-Vilant (TRELEGY ELLIPTA) 100-62.5-25 MCG/INH AEPB One puff  inhalation once a day  . folic acid (FOLVITE) 1 MG tablet Take 5 mg by mouth as directed. Once a day on Sunday  . furosemide (LASIX) 40 MG tablet Take 40 mg by mouth 2 (two) times daily.  Marland Kitchen guaiFENesin (MUCINEX) 600 MG 12 hr tablet Take 600 mg by mouth 2 (two) times daily.  . methotrexate (RHEUMATREX) 2.5 MG tablet Take 15 mg by mouth once a week. On Sunday - Caution:Chemotherapy. Protect from light.  . miconazole (ZEASORB-AF) 2 % powder Apply 1 application topically 2 (two) times daily as needed.  . NON FORMULARY Diet Type:  NAS,  Consistent Carbohydrates, Dysphagia 3  . ondansetron (ZOFRAN) 4 MG tablet Take 4 mg by mouth every 6 (six) hours as needed for nausea or vomiting.  . OXYGEN Inhale 3.5 L/min into the lungs continuous.  . pantoprazole (PROTONIX) 40 MG tablet Take 40 mg by mouth 2 (two) times daily.  . phenylephrine-shark liver oil-mineral oil-petrolatum (PREPARATION H) 0.25-3-14-71.9 % rectal ointment Place 1 application rectally 2 (two) times daily as needed for hemorrhoids.  . polyethylene glycol (MIRALAX / GLYCOLAX) packet Take 17 g by mouth daily as needed for moderate constipation.  . potassium chloride SA (K-DUR,KLOR-CON) 20 MEQ tablet Take 40 mEq by mouth 3 (three) times daily.  . pregabalin (LYRICA) 50 MG capsule Take 1 capsule by mouth  Twice daily  . talc (ZEASORB) powder Apply 1 application topically 2 (two) times daily as needed.  . traMADol (ULTRAM) 50 MG tablet Take 0.5 tablets (25 mg total) by mouth at bedtime.  . vitamin B-12 (CYANOCOBALAMIN) 1000 MCG tablet Take 1,000 mcg by mouth daily. 8 am for b12 level of 388   No facility-administered encounter medications on file as of 09/16/2020.     SIGNIFICANT DIAGNOSTIC EXAMS   PREVIOUS;    01-20-19: DEXA: t score -4.997  05-10-20: chest x-ray: interstitial edema peribronchial cuffing; cardiomegaly; pulmonary vascular redistribution  NO NEW EXAMS. Marland Kitchen    LABS REVIEWED PREVIOUS :   08-15-19: urine micro-albumin 5.0   12-05-19: wbc 6.8; hgb 10.6; hct 36.9; mcv 87.9 plt 104; glucose 188; bun 13; creat 1.04; k+ 3.9; na++ 136; ca 9.2 liver normal albumin 2.8 hgb a1c 7.0 urine micro-albumin <3.0 01-13-20: glucose 224; bun 14; creat 1.21; k+ 4.3; na++ 137; ca 9.4 BNP 144.0 01-20-20: glucose 152; bun 14; creat 1.03 ;k+ 4.2; na++ 140 ca 9.9 03-02-20: glucose bun 20; creat 1.19 ;k+ 4.0; na++ 138; ca 9.6 05-12-20: wbc 4.6; hgb 9.9; hct 35.5; mcv 85.1 plt 102; glucose 185;  bun 19; creat 1.19 ;k+ 3.7; na++ 142; ca 9.8; GFR44 vit B 12: 388; folate 20.3 06-06-20: wbc 11.1; hgb 10.7; hct 38.4 mcv 84.6 plt 92; glucose 237; bun 16; creat 1.32; k+ 3.4; na++ 137; ca 9.4 GFR38; urine culture multiple species 06-08-20: wbc 8.5; hgb 9.2; hct 32.8; mcv 84.8 plt 96; glucose 251; bun 17; creat 1.25; k+ 3.6; na++ 137; ca 9.4 GFR41 06-11-20: glucose 189; bun 16; creat 1.27; k+ 3.6; na++ 136; ca 9.4 GFR 40 06-18-20: hgb a1c 7.4  08-18-20: glucose 136; bun 19; creat 1.23; k+ 4.0; na++ 139; ca 8.6; GFR 42  TODAY  09-03-20: wbc 4.9; hgb 8.1; hct 9.2; mcv 80,7 plt 98; glucose 136; bun 16; creat 1.18; k+ 3.5; na++ 140; ca 8.6; GFR 44; liver normal albumin 2.8   Review of Systems  Constitutional:  Negative for malaise/fatigue.  Respiratory:  Negative for cough and shortness of breath.   Cardiovascular:  Negative for chest pain, palpitations and leg swelling.  Gastrointestinal:  Negative for abdominal pain, constipation and heartburn.  Musculoskeletal:  Negative for back pain, joint pain and myalgias.  Skin: Negative.   Neurological:  Negative for dizziness.  Psychiatric/Behavioral:  The patient is not nervous/anxious.    Physical Exam Constitutional:      General: She is not in acute distress.    Appearance: She is well-developed. She is not diaphoretic.  Neck:     Thyroid: No thyromegaly.  Cardiovascular:     Rate and Rhythm: Normal rate and regular rhythm.     Pulses: Normal pulses.     Heart sounds: Normal heart sounds.  Pulmonary:      Effort: Pulmonary effort is normal. No respiratory distress.     Breath sounds: Normal breath sounds.     Comments:  02 dependent Abdominal:     General: Bowel sounds are normal. There is no distension.     Palpations: Abdomen is soft.     Tenderness: There is no abdominal tenderness.  Musculoskeletal:     Cervical back: Neck supple.     Right lower leg: Edema present.     Left lower leg: Edema present.     Comments: 3+ bilateral lower extremity edema Unable to move left upper extremity due to frozen left shoulder Able to move other extremities     Lymphadenopathy:     Cervical: No cervical adenopathy.  Skin:    General: Skin is warm and dry.  Neurological:     Mental Status: She is alert. Mental status is at baseline.  Psychiatric:        Mood and Affect: Mood normal.    ASSESSMENT/ PLAN:  TODAY:    1. Chronic diastolic heart failure EF 55-60% (07-28-16) will continue lasix 40 mg twice daily with k+ 40 meq daily will monitor   2. Aortic atherosclerosis (cxr 03-09-19) will monitor   3. Hypokalemia: is stable k+ 3.5 will continue k+ 40 meq daily   4. Unspecified COPD/chronic respiratory failure with hypoxia: is stable is 02 dependent will continue trelegy ellipta 100-62.5-25 mcg 1 puff daily   PREVIOUS   5. Unspecified COPD/ chronic respiratory failure with hypoxia is stable is 02 dependent will continue trelegy ellipta 100-62.5-25 mcg 1 puff daily   6. GERD without esophagitis: is stable will continue protonix 40 mg twice daily   7. Vascular dementia without behavioral disturbance is stable weight is 218 pounds will continue aricept 5 mg daily   8. CKD stage 3 due to type 2 diabetes mellitus:  is stable bun 16; creat 1.26 will monitor   9. Diabetic peripheral neuropathy associated with type 2 diabetes mellitus: is stable will continue lyrica 50 mg twice daily   10. Fibromyalgia: is stable will continue lyrica 50 mg twice daily and cymbalta 40 mg daily   11. Rheumatoid  arthritis: is stable will continue methotrexate 12.5 mg weekly and folic acid  12. Post menopausal osteoporosis: is stable t score -4.997 (11-11-20-18) will continue prolia 60 mg every 6 months.  13. Chronic constipation: : is stable will continue miralax daily as needed  14. Type 2 diabetes mellitus with peripheral neuropathy: is stable hgb a1c 7.5 is off tradjenta is on eliquis  15. Dyslipidemia associated with type 2 diabetes mellitus: is stable LDL 68 is off crestor will monitor   16. Atrial fibrillation with RVR is stable will continue cardizem cd 300 mg daily for rate control and eliquis 2.5 mg twice daily         Ok Edwards NP [facility]  Contact [phone] Monday through Friday 8am- 5pm  After hours call [phone]

## 2020-09-17 DIAGNOSIS — J449 Chronic obstructive pulmonary disease, unspecified: Secondary | ICD-10-CM | POA: Diagnosis not present

## 2020-09-17 DIAGNOSIS — I129 Hypertensive chronic kidney disease with stage 1 through stage 4 chronic kidney disease, or unspecified chronic kidney disease: Secondary | ICD-10-CM | POA: Diagnosis not present

## 2020-09-17 DIAGNOSIS — Z1159 Encounter for screening for other viral diseases: Secondary | ICD-10-CM | POA: Diagnosis not present

## 2020-09-22 DIAGNOSIS — J449 Chronic obstructive pulmonary disease, unspecified: Secondary | ICD-10-CM | POA: Diagnosis not present

## 2020-09-22 DIAGNOSIS — Z1159 Encounter for screening for other viral diseases: Secondary | ICD-10-CM | POA: Diagnosis not present

## 2020-09-22 DIAGNOSIS — I129 Hypertensive chronic kidney disease with stage 1 through stage 4 chronic kidney disease, or unspecified chronic kidney disease: Secondary | ICD-10-CM | POA: Diagnosis not present

## 2020-09-23 ENCOUNTER — Other Ambulatory Visit: Payer: Self-pay | Admitting: Adult Health

## 2020-09-23 MED ORDER — PREGABALIN 50 MG PO CAPS: ORAL_CAPSULE | ORAL | 0 refills | Status: DC

## 2020-09-24 ENCOUNTER — Other Ambulatory Visit (HOSPITAL_COMMUNITY)
Admission: RE | Admit: 2020-09-24 | Discharge: 2020-09-24 | Disposition: A | Discharge status: Home / Self Care | Payer: Medicare Other | Source: Skilled Nursing Facility | Attending: Adult Health | Admitting: Adult Health

## 2020-09-24 DIAGNOSIS — I129 Hypertensive chronic kidney disease with stage 1 through stage 4 chronic kidney disease, or unspecified chronic kidney disease: Secondary | ICD-10-CM | POA: Diagnosis not present

## 2020-09-24 DIAGNOSIS — R710 Precipitous drop in hematocrit: Secondary | ICD-10-CM | POA: Insufficient documentation

## 2020-09-24 DIAGNOSIS — J449 Chronic obstructive pulmonary disease, unspecified: Secondary | ICD-10-CM | POA: Diagnosis not present

## 2020-09-24 DIAGNOSIS — Z1159 Encounter for screening for other viral diseases: Secondary | ICD-10-CM | POA: Diagnosis not present

## 2020-09-24 LAB — OCCULT BLOOD X 1 CARD TO LAB, STOOL: Fecal Occult Bld: NEGATIVE

## 2020-09-29 DIAGNOSIS — Z1159 Encounter for screening for other viral diseases: Secondary | ICD-10-CM | POA: Diagnosis not present

## 2020-09-29 DIAGNOSIS — J449 Chronic obstructive pulmonary disease, unspecified: Secondary | ICD-10-CM | POA: Diagnosis not present

## 2020-09-29 DIAGNOSIS — I129 Hypertensive chronic kidney disease with stage 1 through stage 4 chronic kidney disease, or unspecified chronic kidney disease: Secondary | ICD-10-CM | POA: Diagnosis not present

## 2020-10-01 DIAGNOSIS — J449 Chronic obstructive pulmonary disease, unspecified: Secondary | ICD-10-CM | POA: Diagnosis not present

## 2020-10-01 DIAGNOSIS — Z1159 Encounter for screening for other viral diseases: Secondary | ICD-10-CM | POA: Diagnosis not present

## 2020-10-01 DIAGNOSIS — I129 Hypertensive chronic kidney disease with stage 1 through stage 4 chronic kidney disease, or unspecified chronic kidney disease: Secondary | ICD-10-CM | POA: Diagnosis not present

## 2020-10-06 DIAGNOSIS — J449 Chronic obstructive pulmonary disease, unspecified: Secondary | ICD-10-CM | POA: Diagnosis not present

## 2020-10-06 DIAGNOSIS — I129 Hypertensive chronic kidney disease with stage 1 through stage 4 chronic kidney disease, or unspecified chronic kidney disease: Secondary | ICD-10-CM | POA: Diagnosis not present

## 2020-10-06 DIAGNOSIS — Z1159 Encounter for screening for other viral diseases: Secondary | ICD-10-CM | POA: Diagnosis not present

## 2020-10-08 DIAGNOSIS — I129 Hypertensive chronic kidney disease with stage 1 through stage 4 chronic kidney disease, or unspecified chronic kidney disease: Secondary | ICD-10-CM | POA: Diagnosis not present

## 2020-10-08 DIAGNOSIS — J449 Chronic obstructive pulmonary disease, unspecified: Secondary | ICD-10-CM | POA: Diagnosis not present

## 2020-10-08 DIAGNOSIS — Z1159 Encounter for screening for other viral diseases: Secondary | ICD-10-CM | POA: Diagnosis not present

## 2020-10-12 DIAGNOSIS — L602 Onychogryphosis: Secondary | ICD-10-CM | POA: Diagnosis not present

## 2020-10-12 DIAGNOSIS — L603 Nail dystrophy: Secondary | ICD-10-CM | POA: Diagnosis not present

## 2020-10-12 DIAGNOSIS — E1140 Type 2 diabetes mellitus with diabetic neuropathy, unspecified: Secondary | ICD-10-CM | POA: Diagnosis not present

## 2020-10-13 ENCOUNTER — Other Ambulatory Visit: Payer: Self-pay | Admitting: Adult Health

## 2020-10-13 DIAGNOSIS — I129 Hypertensive chronic kidney disease with stage 1 through stage 4 chronic kidney disease, or unspecified chronic kidney disease: Secondary | ICD-10-CM | POA: Diagnosis not present

## 2020-10-13 DIAGNOSIS — Z1159 Encounter for screening for other viral diseases: Secondary | ICD-10-CM | POA: Diagnosis not present

## 2020-10-13 DIAGNOSIS — J449 Chronic obstructive pulmonary disease, unspecified: Secondary | ICD-10-CM | POA: Diagnosis not present

## 2020-10-13 MED ORDER — TRAMADOL HCL 50 MG PO TABS: 25.0000 mg | ORAL_TABLET | Freq: Every day | ORAL | 0 refills | Status: DC

## 2020-10-14 ENCOUNTER — Non-Acute Institutional Stay (SKILLED_NURSING_FACILITY): Payer: Medicare Other | Admitting: Internal Medicine

## 2020-10-14 ENCOUNTER — Encounter: Payer: Self-pay | Admitting: Internal Medicine

## 2020-10-14 DIAGNOSIS — N183 Chronic kidney disease, stage 3 unspecified: Secondary | ICD-10-CM

## 2020-10-14 DIAGNOSIS — E440 Moderate protein-calorie malnutrition: Secondary | ICD-10-CM

## 2020-10-14 DIAGNOSIS — D649 Anemia, unspecified: Secondary | ICD-10-CM | POA: Diagnosis not present

## 2020-10-14 DIAGNOSIS — E1122 Type 2 diabetes mellitus with diabetic chronic kidney disease: Secondary | ICD-10-CM

## 2020-10-14 DIAGNOSIS — E1142 Type 2 diabetes mellitus with diabetic polyneuropathy: Secondary | ICD-10-CM

## 2020-10-14 DIAGNOSIS — E46 Unspecified protein-calorie malnutrition: Secondary | ICD-10-CM | POA: Insufficient documentation

## 2020-10-14 NOTE — Progress Notes (Signed)
   NURSING HOME LOCATION:  [facility] ROOM NUMBER:  [id number] D  CODE STATUS:  DNR  PCP:  Ok Edwards NP  This is a nursing facility follow up visit of chronic medical diagnoses & to document compliance with Regulation 483.30 (c) in [employer] [place] Manual Phase 2 which mandates caregiver visit ( visits can alternate among physician, PA or NP as per statutes) within 10 days of 30 days / 60 days/ 90 days post admission to SNF date    Interim medical record and care since last SNF visit was updated with review of diagnostic studies and change in clinical status since last visit were documented.  HPI: She is a permanent resident of this facility with medical diagnoses of chronic anemia, atrial fibrillation, oxygen dependent COPD, NASH, GERD, GAD, dyslipidemia, essential hypertension, rheumatoid arthritis, vitamin D deficiency, osteoporosis, and diabetes with neurologic complications.  Review of systems: Dementia invalidated responses.   She could give me her year of birth but could not tell me what year this is.  She introduced me to her daughter [name], a doll with which she was sleeping.  She cannot tell me how old [name] is.   Her only complaint was being cold.  Indeed her roommate had the air conditioning turned into the high 60s. Accu-Cheks at the SNF range from a low of 152 up to 174.  The current A1c of 7.4% was performed 06/18/2020.  Physical exam:  Pertinent or positive findings: When I entered the room at approximately 2:50 PM she was asleep and fully clothed and covered with blankets.  She appears her stated age with markedly weathered facies.  She is markedly hard of hearing.  She is wearing nasal oxygen.  She is edentulous.  There is slight variation to the heart rhythm.  She has minor rales anteriorly.  Abdomen is protuberant.  Dorsalis pedis pulses are stronger than posterior tibial pulses.  She has nonpitting edema of the extremities.  There is marked enlargement  of the right knee with fusiform change.  General appearance: Adequately nourished; no acute distress, increased work of breathing is present.   Lymphatic: No lymphadenopathy about the head, neck, axilla. Eyes: No conjunctival inflammation or lid edema is present. There is no scleral icterus. Ears:  External ear exam shows no significant lesions or deformities.   Nose:  External nasal examination shows no deformity or inflammation. Nasal mucosa are pink and moist without lesions, exudates Oral exam:  Lips and gums are healthy appearing. There is no oropharyngeal erythema or exudate. Neck:  No thyromegaly, masses, tenderness noted.    Heart:  No gallop, murmur, click, rub .  Lungs:  without wheezes, rhonchi, rubs. Abdomen: Bowel sounds are normal. Abdomen is soft and nontender with no organomegaly, hernias, masses. GU: Deferred  Extremities:  No cyanosis, clubbing  Neurologic exam :Balance, [place], finger to nose testing could not be completed due to clinical state Skin: Warm & dry w/o tenting. No significant visible lesions or rash.  See summary under each active problem in the Problem List with associated updated therapeutic plan

## 2020-10-14 NOTE — Assessment & Plan Note (Addendum)
DM with neuro , renal & vascular complications Glucose range  @ SNF: Accu-cheks 152-175 Current A1c: 7.4% A1c goal : < 8% No hypoglycemia No change indicated

## 2020-10-14 NOTE — Assessment & Plan Note (Signed)
Current creat 1.18/GFR 44,CKD Stage 3b Medication List reviewed; no nephrotoxic agents identified.

## 2020-10-14 NOTE — Assessment & Plan Note (Signed)
Anemia has progressed slightly to H/H of 8.1/30.1 with hypochromic indices.  She is on folate and B12 supplementation but no iron supplementation. Iron supplementation will be discussed with the NP

## 2020-10-14 NOTE — Patient Instructions (Signed)
See assessment and plan under each diagnosis in the problem list and acutely for this visit 

## 2020-10-14 NOTE — Assessment & Plan Note (Signed)
Current albumin 2.8 and total protein 5.9.  Nutritionist to monitor at [facility].

## 2020-10-19 ENCOUNTER — Other Ambulatory Visit: Payer: Self-pay | Admitting: Adult Health

## 2020-10-19 MED ORDER — PREGABALIN 50 MG PO CAPS: ORAL_CAPSULE | ORAL | 0 refills | Status: DC

## 2020-10-20 ENCOUNTER — Encounter (HOSPITAL_COMMUNITY)
Admission: RE | Admit: 2020-10-20 | Discharge: 2020-10-20 | Disposition: A | Discharge status: Home / Self Care | Payer: Medicare Other | Source: Skilled Nursing Facility | Attending: Adult Health | Admitting: Adult Health

## 2020-10-20 DIAGNOSIS — D649 Anemia, unspecified: Secondary | ICD-10-CM | POA: Insufficient documentation

## 2020-10-20 DIAGNOSIS — N183 Chronic kidney disease, stage 3 unspecified: Secondary | ICD-10-CM | POA: Insufficient documentation

## 2020-10-20 DIAGNOSIS — R710 Precipitous drop in hematocrit: Secondary | ICD-10-CM | POA: Diagnosis not present

## 2020-10-20 LAB — OCCULT BLOOD X 1 CARD TO LAB, STOOL: Fecal Occult Bld: NEGATIVE

## 2020-10-26 ENCOUNTER — Inpatient Hospital Stay (HOSPITAL_COMMUNITY): Payer: Medicare Other | Attending: Adult Health

## 2020-10-26 ENCOUNTER — Non-Acute Institutional Stay (SKILLED_NURSING_FACILITY): Payer: Medicare Other | Admitting: Adult Health

## 2020-10-26 DIAGNOSIS — J90 Pleural effusion, not elsewhere classified: Secondary | ICD-10-CM | POA: Diagnosis not present

## 2020-10-26 DIAGNOSIS — I5032 Chronic diastolic (congestive) heart failure: Secondary | ICD-10-CM | POA: Diagnosis not present

## 2020-10-26 DIAGNOSIS — R059 Cough, unspecified: Secondary | ICD-10-CM | POA: Diagnosis not present

## 2020-10-26 DIAGNOSIS — R062 Wheezing: Secondary | ICD-10-CM | POA: Diagnosis not present

## 2020-10-26 DIAGNOSIS — I517 Cardiomegaly: Secondary | ICD-10-CM | POA: Diagnosis not present

## 2020-10-26 NOTE — Progress Notes (Signed)
Location:  [place] Room Number: 106 Place of Service:  SNF (31)   CODE STATUS: dnr  Allergies  Allergen Reactions   Other     Band Aids    Tape    Lipitor [Atorvastatin Calcium] Rash and Other (See Comments)    Muscle pain   Niaspan [Niacin Er] Rash and Other (See Comments)    Muscle pain    Chief Complaint  Patient presents with   Acute Visit    Edema     HPI:  Staff reports that her lower extremity edema is getting worse. She does have pain present in her feet. Unable to palpate pulses due to edema. Feet are warm to touch. She denies any worsening shortness of breath or coughing present. She is taking lasix 40 mg three times daily   Past Medical History:  Diagnosis Date   Anemia    Atrial fibrillation (HCC)    Back pain    Bacterial pneumonia    Cancer (HCC)    Skin    Chronic respiratory failure (HCC)    Cognitive communication deficit    COPD (chronic obstructive pulmonary disease) (HCC)    Dementia ([place])    Depression    Diabetes mellitus without complication ([place])    Diverticulosis    Fatty liver    Fibromyalgia    Gastric polyp 09/08/11   at the anastomosis-inflammatory   Generalized anxiety disorder    GERD (gastroesophageal reflux disease)    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)    Hypercholesteremia    Hypertension    Insomnia    Internal hemorrhoids    Muscle weakness    Neuropathy    On home O2    qhs   Osteoporosis    Rheumatoid arteritis ([place])    Ulcer    stomach   Vitamin D deficiency     Past Surgical History:  Procedure Laterality Date   ABDOMINAL HYSTERECTOMY     ABDOMINAL SURGERY     removed patial stomach from ulcer   COLONOSCOPY  04-2001   internal hemorrhoids, panocolonic diverticulosis, and colon polyps    UPPER GASTROINTESTINAL ENDOSCOPY      Social History   Socioeconomic History   Marital status: Widowed    Spouse name: Not on file   Number of children: 6   Years of education: Not  on file   Highest education level: Not on file  Occupational History   Occupation: Retired  Tobacco Use   Smoking status: Never   Smokeless tobacco: Never  Vaping Use   Vaping Use: Never used  Substance and Sexual Activity   Alcohol use: No   Drug use: No   Sexual activity: Not Currently  Other Topics Concern   Not on file  Social History Narrative   Long term resident of [facility]    Social Determinants of Health   Financial Resource Strain: Not on file  Food Insecurity: Not on file  Transportation Needs: Not on file  Physical Activity: Not on file  Stress: Not on file  Social Connections: Not on file  Intimate Partner Violence: Not on file   Family History  Problem Relation Age of Onset   Heart disease Father    Kidney disease Mother        kidney cancer   Colon cancer Daughter 50      VITAL SIGNS BP (!) 158/74   Pulse 84   Temp 98.1 F (36.7 C)   Ht 5\' 3"  (1.6 m)  Wt 210 lb 3.2 oz (95.3 kg)   BMI 37.24 kg/m   Outpatient Encounter Medications as of 10/26/2020  Medication Sig   acetaminophen (TYLENOL) 650 MG CR tablet Take 650 mg by mouth every 6 (six) hours.   apixaban (ELIQUIS) 2.5 MG TABS tablet Take 2.5 mg by mouth 2 (two) times daily.   denosumab (PROLIA) 60 MG/ML SOSY injection Inject 60 mg into the skin every 6 (six) months.   diltiazem (CARDIZEM CD) 300 MG 24 hr capsule Take 300 mg by mouth daily.   donepezil (ARICEPT) 10 MG tablet Take 10 mg by mouth at bedtime. For unspecified dementia without behavioral disturbance]   DULoxetine (CYMBALTA) 20 MG capsule Take 40 mg by mouth daily.    Fluticasone-Umeclidin-Vilant (TRELEGY ELLIPTA) 100-62.5-25 MCG/INH AEPB One puff inhalation once a day   folic acid (FOLVITE) 1 MG tablet Take 5 mg by mouth as directed. Once a day on Sunday   furosemide (LASIX) 40 MG tablet Take 40 mg by mouth 2 (two) times daily.   guaiFENesin (MUCINEX) 600 MG 12 hr tablet Take 600 mg by mouth 2 (two) times daily.   methotrexate  (RHEUMATREX) 2.5 MG tablet Take 15 mg by mouth once a week. On Sunday - Caution:Chemotherapy. Protect from light.   miconazole (ZEASORB-AF) 2 % powder Apply 1 application topically 2 (two) times daily as needed.   NON FORMULARY Diet Type:  NAS,  Consistent Carbohydrates, Dysphagia 3   ondansetron (ZOFRAN) 4 MG tablet Take 4 mg by mouth every 6 (six) hours as needed for nausea or vomiting.   OXYGEN Inhale 3.5 L/min into the lungs continuous.   pantoprazole (PROTONIX) 40 MG tablet Take 40 mg by mouth 2 (two) times daily.   phenylephrine-shark liver oil-mineral oil-petrolatum (PREPARATION H) 0.25-3-14-71.9 % rectal ointment Place 1 application rectally 2 (two) times daily as needed for hemorrhoids.   polyethylene glycol (MIRALAX / GLYCOLAX) packet Take 17 g by mouth daily as needed for moderate constipation.   potassium chloride SA (K-DUR,KLOR-CON) 20 MEQ tablet Take 40 mEq by mouth 3 (three) times daily.   pregabalin (LYRICA) 50 MG capsule Take 1 capsule by mouth  Twice daily   talc (ZEASORB) powder Apply 1 application topically 2 (two) times daily as needed.   traMADol (ULTRAM) 50 MG tablet Take 0.5 tablets (25 mg total) by mouth at bedtime.   vitamin B-12 (CYANOCOBALAMIN) 1000 MCG tablet Take 1,000 mcg by mouth daily. 8 am for b12 level of 388   No facility-administered encounter medications on file as of 10/26/2020.     SIGNIFICANT DIAGNOSTIC EXAMS   PREVIOUS;    01-20-19: DEXA: t score -4.997  05-10-20: chest x-ray: interstitial edema peribronchial cuffing; cardiomegaly; pulmonary vascular redistribution  TODAY  10-26-20: chest x-ray:  Mild bibasilar subsegmental atelectasis or edema is noted. Small right pleural effusion is noted. Aortic Atherosclerosis    LABS REVIEWED PREVIOUS :   08-15-19: urine micro-albumin 5.0  12-05-19: wbc 6.8; hgb 10.6; hct 36.9; mcv 87.9 plt 104; glucose 188; bun 13; creat 1.04; k+ 3.9; na++ 136; ca 9.2 liver normal albumin 2.8 hgb a1c 7.0 urine  micro-albumin <3.0 01-13-20: glucose 224; bun 14; creat 1.21; k+ 4.3; na++ 137; ca 9.4 BNP 144.0 01-20-20: glucose 152; bun 14; creat 1.03 ;k+ 4.2; na++ 140 ca 9.9 03-02-20: glucose bun 20; creat 1.19 ;k+ 4.0; na++ 138; ca 9.6 05-12-20: wbc 4.6; hgb 9.9; hct 35.5; mcv 85.1 plt 102; glucose 185; bun 19; creat 1.19 ;k+ 3.7; na++ 142; ca 9.8; GFR44 vit  B 12: 388; folate 20.3 06-06-20: wbc 11.1; hgb 10.7; hct 38.4 mcv 84.6 plt 92; glucose 237; bun 16; creat 1.32; k+ 3.4; na++ 137; ca 9.4 GFR38; urine culture multiple species 06-08-20: wbc 8.5; hgb 9.2; hct 32.8; mcv 84.8 plt 96; glucose 251; bun 17; creat 1.25; k+ 3.6; na++ 137; ca 9.4 GFR41 06-11-20: glucose 189; bun 16; creat 1.27; k+ 3.6; na++ 136; ca 9.4 GFR 40 06-18-20: hgb a1c 7.4  08-18-20: glucose 136; bun 19; creat 1.23; k+ 4.0; na++ 139; ca 8.6; GFR 42 09-03-20: wbc 4.9; hgb 8.1; hct 9.2; mcv 80,7 plt 98; glucose 136; bun 16; creat 1.18; k+ 3.5; na++ 140; ca 8.6; GFR 44; liver normal albumin 2.8   NO NEW LABS.   Review of Systems  Constitutional:  Negative for malaise/fatigue.  Respiratory:  Negative for cough and shortness of breath.   Cardiovascular:  Positive for leg swelling. Negative for chest pain and palpitations.  Gastrointestinal:  Negative for abdominal pain, constipation and heartburn.  Musculoskeletal:  Positive for joint pain. Negative for back pain and myalgias.       Feet hurt   Skin: Negative.   Neurological:  Negative for dizziness.  Psychiatric/Behavioral:  The patient is not nervous/anxious.    Physical Exam Constitutional:      General: She is not in acute distress.    Appearance: She is well-developed. She is not diaphoretic.  Neck:     Thyroid: No thyromegaly.  Cardiovascular:     Rate and Rhythm: Normal rate and regular rhythm.     Heart sounds: Normal heart sounds.     Comments: Unable to palpate pedal pulses due to edema  Pulmonary:     Effort: Pulmonary effort is normal. No respiratory distress.     Breath  sounds: Rhonchi present.     Comments: 02 dependent  Abdominal:     General: Bowel sounds are normal. There is no distension.     Palpations: Abdomen is soft.     Tenderness: There is no abdominal tenderness.  Musculoskeletal:     Cervical back: Neck supple.     Right lower leg: Edema present.     Left lower leg: Edema present.     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  Skin:    General: Skin is warm and dry.  Neurological:     Mental Status: She is alert. Mental status is at baseline.  Psychiatric:        Mood and Affect: Mood normal.     ASSESSMENT/ PLAN:  TODAY  Chronic diastolic congestive heart failure:   Worse: will change to demadex 80 mg twice daily  Will begin prostat three times daily due to low albumin  Will check CMP 10-29-20.    Ok Edwards NP [facility]  Contact [phone] Monday through Friday 8am- 5pm  After hours call [phone]

## 2020-10-29 ENCOUNTER — Encounter: Payer: Self-pay | Admitting: Adult Health

## 2020-10-29 ENCOUNTER — Encounter (HOSPITAL_COMMUNITY)
Admission: RE | Admit: 2020-10-29 | Discharge: 2020-10-29 | Disposition: A | Discharge status: Home / Self Care | Payer: Medicare Other | Source: Skilled Nursing Facility | Attending: Adult Health | Admitting: Adult Health

## 2020-10-29 ENCOUNTER — Non-Acute Institutional Stay (SKILLED_NURSING_FACILITY): Payer: Medicare Other | Admitting: Adult Health

## 2020-10-29 DIAGNOSIS — I5032 Chronic diastolic (congestive) heart failure: Secondary | ICD-10-CM

## 2020-10-29 DIAGNOSIS — E876 Hypokalemia: Secondary | ICD-10-CM

## 2020-10-29 DIAGNOSIS — E440 Moderate protein-calorie malnutrition: Secondary | ICD-10-CM | POA: Diagnosis not present

## 2020-10-29 DIAGNOSIS — N183 Chronic kidney disease, stage 3 unspecified: Secondary | ICD-10-CM | POA: Diagnosis not present

## 2020-10-29 DIAGNOSIS — R710 Precipitous drop in hematocrit: Secondary | ICD-10-CM | POA: Diagnosis not present

## 2020-10-29 DIAGNOSIS — D649 Anemia, unspecified: Secondary | ICD-10-CM | POA: Diagnosis not present

## 2020-10-29 LAB — COMPREHENSIVE METABOLIC PANEL WITH GFR
ALT: 14 U/L (ref 0–44)
AST: 24 U/L (ref 15–41)
Albumin: 3.4 g/dL — ABNORMAL LOW (ref 3.5–5.0)
Alkaline Phosphatase: 51 U/L (ref 38–126)
Anion gap: 9 (ref 5–15)
BUN: 21 mg/dL (ref 8–23)
CO2: 32 mmol/L (ref 22–32)
Calcium: 9 mg/dL (ref 8.9–10.3)
Chloride: 98 mmol/L (ref 98–111)
Creatinine, Ser: 1.21 mg/dL — ABNORMAL HIGH (ref 0.44–1.00)
GFR, Estimated: 43 mL/min — ABNORMAL LOW
Glucose, Bld: 159 mg/dL — ABNORMAL HIGH (ref 70–99)
Potassium: 2.9 mmol/L — ABNORMAL LOW (ref 3.5–5.1)
Sodium: 139 mmol/L (ref 135–145)
Total Bilirubin: 0.7 mg/dL (ref 0.3–1.2)
Total Protein: 6.9 g/dL (ref 6.5–8.1)

## 2020-10-29 NOTE — Progress Notes (Signed)
Location:  [place] Room Number: 106 Place of Service:  SNF (31)   CODE STATUS: dnr  Allergies  Allergen Reactions   Other     Band Aids    Tape    Lipitor [Atorvastatin Calcium] Rash and Other (See Comments)    Muscle pain   Niaspan [Niacin Er] Rash and Other (See Comments)    Muscle pain    Chief Complaint  Patient presents with   Acute Visit    Hypokalemia     HPI:  She has been placed on demadex 80 mg twice daily for increased lower extremity edema related to her congestive heart failure. Her edema is slowing improving ; however her k+ is 2.9. she will need supplementation and will need to lower her diuretic   Past Medical History:  Diagnosis Date   Anemia    Atrial fibrillation (HCC)    Back pain    Bacterial pneumonia    Cancer ([place])    Skin    Chronic respiratory failure (HCC)    Cognitive communication deficit    COPD (chronic obstructive pulmonary disease) ([place])    Dementia ([place])    Depression    Diabetes mellitus without complication ([place])    Diverticulosis    Fatty liver    Fibromyalgia    Gastric polyp 09/08/11   at the anastomosis-inflammatory   Generalized anxiety disorder    GERD (gastroesophageal reflux disease)    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)    Hypercholesteremia    Hypertension    Insomnia    Internal hemorrhoids    Muscle weakness    Neuropathy    On home O2    qhs   Osteoporosis    Rheumatoid arteritis ([place])    Ulcer    stomach   Vitamin D deficiency     Past Surgical History:  Procedure Laterality Date   ABDOMINAL HYSTERECTOMY     ABDOMINAL SURGERY     removed patial stomach from ulcer   COLONOSCOPY  04-2001   internal hemorrhoids, panocolonic diverticulosis, and colon polyps    UPPER GASTROINTESTINAL ENDOSCOPY      Social History   Socioeconomic History   Marital status: Widowed    Spouse name: Not on file   Number of children: 6   Years of education: Not on file   Highest  education level: Not on file  Occupational History   Occupation: Retired  Tobacco Use   Smoking status: Never   Smokeless tobacco: Never  Vaping Use   Vaping Use: Never used  Substance and Sexual Activity   Alcohol use: No   Drug use: No   Sexual activity: Not Currently  Other Topics Concern   Not on file  Social History Narrative   Long term resident of [facility]    Social Determinants of Health   Financial Resource Strain: Not on file  Food Insecurity: Not on file  Transportation Needs: Not on file  Physical Activity: Not on file  Stress: Not on file  Social Connections: Not on file  Intimate Partner Violence: Not on file   Family History  Problem Relation Age of Onset   Heart disease Father    Kidney disease Mother        kidney cancer   Colon cancer Daughter 43      VITAL SIGNS BP (!) 158/74   Pulse 84   Temp 97.7 F (36.5 C)   Resp 18   Ht 5\' 3"  (1.6 m)  Wt 210 lb 3.2 oz (95.3 kg)   SpO2 97%   BMI 37.24 kg/m   Outpatient Encounter Medications as of 10/29/2020  Medication Sig   torsemide (DEMADEX) 20 MG tablet Take 80 mg by mouth 2 (two) times daily.   acetaminophen (TYLENOL) 650 MG CR tablet Take 650 mg by mouth every 6 (six) hours.   apixaban (ELIQUIS) 2.5 MG TABS tablet Take 2.5 mg by mouth 2 (two) times daily.   denosumab (PROLIA) 60 MG/ML SOSY injection Inject 60 mg into the skin every 6 (six) months.   diltiazem (CARDIZEM CD) 300 MG 24 hr capsule Take 300 mg by mouth daily.   donepezil (ARICEPT) 10 MG tablet Take 10 mg by mouth at bedtime. For unspecified dementia without behavioral disturbance]   DULoxetine (CYMBALTA) 20 MG capsule Take 40 mg by mouth daily.    Fluticasone-Umeclidin-Vilant (TRELEGY ELLIPTA) 100-62.5-25 MCG/INH AEPB One puff inhalation once a day   folic acid (FOLVITE) 1 MG tablet Take 5 mg by mouth as directed. Once a day on Sunday   guaiFENesin (MUCINEX) 600 MG 12 hr tablet Take 600 mg by mouth 2 (two) times daily.   methotrexate  (RHEUMATREX) 2.5 MG tablet Take 15 mg by mouth once a week. On Sunday - Caution:Chemotherapy. Protect from light.   miconazole (ZEASORB-AF) 2 % powder Apply 1 application topically 2 (two) times daily as needed.   NON FORMULARY Diet Type:  NAS,  Consistent Carbohydrates, Dysphagia 3   ondansetron (ZOFRAN) 4 MG tablet Take 4 mg by mouth every 6 (six) hours as needed for nausea or vomiting.   OXYGEN Inhale 3.5 L/min into the lungs continuous.   pantoprazole (PROTONIX) 40 MG tablet Take 40 mg by mouth 2 (two) times daily.   phenylephrine-shark liver oil-mineral oil-petrolatum (PREPARATION H) 0.25-3-14-71.9 % rectal ointment Place 1 application rectally 2 (two) times daily as needed for hemorrhoids.   polyethylene glycol (MIRALAX / GLYCOLAX) packet Take 17 g by mouth daily as needed for moderate constipation.   potassium chloride SA (K-DUR,KLOR-CON) 20 MEQ tablet Take 40 mEq by mouth 3 (three) times daily.   pregabalin (LYRICA) 50 MG capsule Take 1 capsule by mouth  Twice daily   talc (ZEASORB) powder Apply 1 application topically 2 (two) times daily as needed.   torsemide 80 mg twice daily    traMADol (ULTRAM) 50 MG tablet Take 0.5 tablets (25 mg total) by mouth at bedtime.   vitamin B-12 (CYANOCOBALAMIN) 1000 MCG tablet Take 1,000 mcg by mouth daily. 8 am for b12 level of 388   [DISCONTINUED] furosemide (LASIX) 40 MG tablet Take 40 mg by mouth 2 (two) times daily.   No facility-administered encounter medications on file as of 10/29/2020.     SIGNIFICANT DIAGNOSTIC EXAMS   PREVIOUS;    01-20-19: DEXA: t score -4.997  05-10-20: chest x-ray: interstitial edema peribronchial cuffing; cardiomegaly; pulmonary vascular redistribution  TODAY  10-26-20: chest x-ray:  Mild bibasilar subsegmental atelectasis or edema is noted. Small right pleural effusion is noted. Aortic Atherosclerosis    LABS REVIEWED PREVIOUS :   12-05-19: wbc 6.8; hgb 10.6; hct 36.9; mcv 87.9 plt 104; glucose 188; bun 13;  creat 1.04; k+ 3.9; na++ 136; ca 9.2 liver normal albumin 2.8 hgb a1c 7.0 urine micro-albumin <3.0 01-13-20: glucose 224; bun 14; creat 1.21; k+ 4.3; na++ 137; ca 9.4 BNP 144.0 01-20-20: glucose 152; bun 14; creat 1.03 ;k+ 4.2; na++ 140 ca 9.9 03-02-20: glucose bun 20; creat 1.19 ;k+ 4.0; na++ 138; ca 9.6 05-12-20:  wbc 4.6; hgb 9.9; hct 35.5; mcv 85.1 plt 102; glucose 185; bun 19; creat 1.19 ;k+ 3.7; na++ 142; ca 9.8; GFR44 vit B 12: 388; folate 20.3 06-06-20: wbc 11.1; hgb 10.7; hct 38.4 mcv 84.6 plt 92; glucose 237; bun 16; creat 1.32; k+ 3.4; na++ 137; ca 9.4 GFR38; urine culture multiple species 06-08-20: wbc 8.5; hgb 9.2; hct 32.8; mcv 84.8 plt 96; glucose 251; bun 17; creat 1.25; k+ 3.6; na++ 137; ca 9.4 GFR41 06-11-20: glucose 189; bun 16; creat 1.27; k+ 3.6; na++ 136; ca 9.4 GFR 40 06-18-20: hgb a1c 7.4  08-18-20: glucose 136; bun 19; creat 1.23; k+ 4.0; na++ 139; ca 8.6; GFR 42 09-03-20: wbc 4.9; hgb 8.1; hct 9.2; mcv 80,7 plt 98; glucose 136; bun 16; creat 1.18; k+ 3.5; na++ 140; ca 8.6; GFR 44; liver normal albumin 2.8   TODAY  10-29-20: glucose 159; bun 21; creat 1.21; k+ 2.9; na++ 139; ca 9.0; GFR 43; liver normal albumin 3.4    Review of Systems  Constitutional:  Negative for malaise/fatigue.  Respiratory:  Negative for cough and shortness of breath.   Cardiovascular:  Positive for leg swelling. Negative for chest pain and palpitations.  Gastrointestinal:  Negative for abdominal pain, constipation and heartburn.  Musculoskeletal:  Negative for back pain, joint pain and myalgias.  Skin: Negative.   Neurological:  Negative for dizziness.  Psychiatric/Behavioral:  The patient is not nervous/anxious.    Physical Exam Constitutional:      General: She is not in acute distress.    Appearance: She is not diaphoretic.  Eyes:     Conjunctiva/sclera: Conjunctivae normal.  Neck:     Thyroid: No thyromegaly.     Vascular: No JVD.  Cardiovascular:     Rate and Rhythm: Normal rate and regular  rhythm.     Pulses: Normal pulses.  Pulmonary:     Effort: Pulmonary effort is normal. No respiratory distress.     Breath sounds: Normal breath sounds. No wheezing.  Abdominal:     General: Bowel sounds are normal. There is no distension.     Palpations: Abdomen is soft.     Tenderness: There is no abdominal tenderness.  Musculoskeletal:     Cervical back: Neck supple.     Right lower leg: Edema present.     Left lower leg: Edema present.     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  Skin:    General: Skin is warm and dry.  Neurological:     Mental Status: She is alert. Mental status is at baseline.  Psychiatric:        Mood and Affect: Mood normal.     ASSESSMENT/ PLAN:  TODAY  Chronic diastolic congestive heart failure Hypokalemia Moderate protein calorie malnutrition  Will give k+ 80 meg extra today Will repeat k+ level in the AM Will lower her demadex to 60 mg twice daily Will monitor her status.    Ok Edwards NP [facility]  Contact [phone] Monday through Friday 8am- 5pm  After hours call [phone]

## 2020-10-30 ENCOUNTER — Other Ambulatory Visit (HOSPITAL_COMMUNITY)
Admission: RE | Admit: 2020-10-30 | Discharge: 2020-10-30 | Disposition: A | Discharge status: Home / Self Care | Payer: Medicare Other | Source: Skilled Nursing Facility | Attending: Adult Health | Admitting: Adult Health

## 2020-10-30 DIAGNOSIS — N183 Chronic kidney disease, stage 3 unspecified: Secondary | ICD-10-CM | POA: Diagnosis not present

## 2020-10-30 LAB — POTASSIUM: Potassium: 3.1 mmol/L — ABNORMAL LOW (ref 3.5–5.1)

## 2020-11-12 ENCOUNTER — Other Ambulatory Visit: Payer: Self-pay | Admitting: Adult Health

## 2020-11-12 MED ORDER — PREGABALIN 50 MG PO CAPS: ORAL_CAPSULE | ORAL | 0 refills | Status: DC

## 2020-11-12 MED ORDER — TRAMADOL HCL 50 MG PO TABS: 25.0000 mg | ORAL_TABLET | Freq: Every day | ORAL | 0 refills | Status: DC

## 2020-11-13 ENCOUNTER — Encounter: Payer: Self-pay | Admitting: Adult Health

## 2020-11-13 ENCOUNTER — Non-Acute Institutional Stay (SKILLED_NURSING_FACILITY): Payer: Medicare Other | Admitting: Adult Health

## 2020-11-13 DIAGNOSIS — J449 Chronic obstructive pulmonary disease, unspecified: Secondary | ICD-10-CM

## 2020-11-13 DIAGNOSIS — F0150 Vascular dementia without behavioral disturbance: Secondary | ICD-10-CM | POA: Diagnosis not present

## 2020-11-13 DIAGNOSIS — K219 Gastro-esophageal reflux disease without esophagitis: Secondary | ICD-10-CM | POA: Diagnosis not present

## 2020-11-13 DIAGNOSIS — J9611 Chronic respiratory failure with hypoxia: Secondary | ICD-10-CM | POA: Diagnosis not present

## 2020-11-13 DIAGNOSIS — N183 Chronic kidney disease, stage 3 unspecified: Secondary | ICD-10-CM | POA: Diagnosis not present

## 2020-11-13 DIAGNOSIS — E1122 Type 2 diabetes mellitus with diabetic chronic kidney disease: Secondary | ICD-10-CM

## 2020-11-13 NOTE — Progress Notes (Signed)
Location:  [facility] Room Number: 106-D Place of Service:  SNF (31)   CODE STATUS: DNR  Allergies  Allergen Reactions   Other     Band Aids    Tape    Lipitor [Atorvastatin Calcium] Rash and Other (See Comments)    Muscle pain   Niaspan [Niacin Er] Rash and Other (See Comments)    Muscle pain    Chief Complaint  Patient presents with   Medical Management of Chronic Issues          Unspecified COPD/ chronic respiratory failure with hypoxia:    GERD without esophagitis:. Vascular dementia without behavioral disturbance: CKD stage 3 due to type 2 diabetes mellitus    HPI:  She is a 87 year old long term resident of this facility being seen for the management of her chronic illnesses: Unspecified COPD/ chronic respiratory failure with hypoxia:    GERD without esophagitis:. Vascular dementia without behavioral disturbance: CKD stage 3 due to type 2 diabetes mellitus. There are no reports of uncontrolled pain; no changes in appetite; her weight is without significant change.   Past Medical History:  Diagnosis Date   Anemia    Atrial fibrillation (HCC)    Back pain    Bacterial pneumonia    Cancer ([place])    Skin    Chronic respiratory failure ([place])    Cognitive communication deficit    COPD (chronic obstructive pulmonary disease) (HCC)    Dementia ([place])    Depression    Diabetes mellitus without complication ([place])    Diverticulosis    Fatty liver    Fibromyalgia    Gastric polyp 09/08/11   at the anastomosis-inflammatory   Generalized anxiety disorder    GERD (gastroesophageal reflux disease)    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)    Hypercholesteremia    Hypertension    Insomnia    Internal hemorrhoids    Muscle weakness    Neuropathy    On home O2    qhs   Osteoporosis    Rheumatoid arteritis ([place])    Ulcer    stomach   Vitamin D deficiency     Past Surgical History:  Procedure Laterality Date   ABDOMINAL HYSTERECTOMY      ABDOMINAL SURGERY     removed patial stomach from ulcer   COLONOSCOPY  04-2001   internal hemorrhoids, panocolonic diverticulosis, and colon polyps    UPPER GASTROINTESTINAL ENDOSCOPY      Social History   Socioeconomic History   Marital status: Widowed    Spouse name: Not on file   Number of children: 6   Years of education: Not on file   Highest education level: Not on file  Occupational History   Occupation: Retired  Tobacco Use   Smoking status: Never   Smokeless tobacco: Never  Vaping Use   Vaping Use: Never used  Substance and Sexual Activity   Alcohol use: No   Drug use: No   Sexual activity: Not Currently  Other Topics Concern   Not on file  Social History Narrative   Long term resident of [facility]    Social Determinants of Health   Financial Resource Strain: Not on file  Food Insecurity: Not on file  Transportation Needs: Not on file  Physical Activity: Not on file  Stress: Not on file  Social Connections: Not on file  Intimate Partner Violence: Not on file   Family History  Problem Relation Age of Onset   Heart disease Father  Kidney disease Mother        kidney cancer   Colon cancer Daughter 62      VITAL SIGNS BP 138/71   Pulse 75   Temp (!) 97.3 F (36.3 C)   Resp 20   Ht 5\' 3"  (1.6 m)   Wt 206 lb 12.8 oz (93.8 kg)   SpO2 90%   BMI 36.63 kg/m   Outpatient Encounter Medications as of 11/13/2020  Medication Sig   acetaminophen (TYLENOL) 650 MG CR tablet Take 650 mg by mouth every 6 (six) hours.   Amino Acids-Protein Hydrolys (PRO-STAT) LIQD Take by mouth. liquid; 15-100 gram-kcal/30 mL; oral Three Times A Day  Special Instructions: for albumin 2.8   apixaban (ELIQUIS) 2.5 MG TABS tablet Take 2.5 mg by mouth 2 (two) times daily.   denosumab (PROLIA) 60 MG/ML SOSY injection Inject 60 mg into the skin every 6 (six) months.   diltiazem (CARDIZEM CD) 300 MG 24 hr capsule Take 300 mg by mouth daily.   donepezil (ARICEPT) 10 MG tablet Take 10 mg  by mouth at bedtime. For unspecified dementia without behavioral disturbance]   DULoxetine (CYMBALTA) 20 MG capsule Take 40 mg by mouth daily.    Fluticasone-Umeclidin-Vilant (TRELEGY ELLIPTA) 100-62.5-25 MCG/INH AEPB One puff inhalation once a day   folic acid (FOLVITE) 1 MG tablet Take 5 mg by mouth as directed. Once a day on Sunday   guaiFENesin (MUCINEX) 600 MG 12 hr tablet Take 600 mg by mouth 2 (two) times daily.   methotrexate (RHEUMATREX) 2.5 MG tablet Take 15 mg by mouth once a week. On Sunday - Caution:Chemotherapy. Protect from light.   miconazole (ZEASORB-AF) 2 % powder Apply 1 application topically 2 (two) times daily as needed.   NON FORMULARY Diet Type:  NAS,  Consistent Carbohydrates, Dysphagia 3   ondansetron (ZOFRAN) 4 MG tablet Take 4 mg by mouth every 6 (six) hours as needed for nausea or vomiting.   OXYGEN Inhale 3.5 L/min into the lungs continuous.   pantoprazole (PROTONIX) 40 MG tablet Take 40 mg by mouth 2 (two) times daily.   phenylephrine-shark liver oil-mineral oil-petrolatum (PREPARATION H) 0.25-3-14-71.9 % rectal ointment Place 1 application rectally 2 (two) times daily as needed for hemorrhoids.   polyethylene glycol (MIRALAX / GLYCOLAX) packet Take 17 g by mouth daily as needed for moderate constipation.   potassium chloride SA (K-DUR,KLOR-CON) 20 MEQ tablet Take 40 mEq by mouth 3 (three) times daily.   pregabalin (LYRICA) 50 MG capsule Take 1 capsule by mouth  Twice daily   torsemide (DEMADEX) 20 MG tablet Take 80 mg by mouth 2 (two) times daily.   traMADol (ULTRAM) 50 MG tablet Take 0.5 tablets (25 mg total) by mouth at bedtime.   vitamin B-12 (CYANOCOBALAMIN) 1000 MCG tablet Take 1,000 mcg by mouth daily. 8 am for b12 level of 388   [DISCONTINUED] talc (ZEASORB) powder Apply 1 application topically 2 (two) times daily as needed.   No facility-administered encounter medications on file as of 11/13/2020.     SIGNIFICANT DIAGNOSTIC EXAMS  PREVIOUS;    01-20-19:  DEXA: t score -4.997  05-10-20: chest x-ray: interstitial edema peribronchial cuffing; cardiomegaly; pulmonary vascular redistribution  10-26-20: chest x-ray:  Mild bibasilar subsegmental atelectasis or edema is noted. Small right pleural effusion is noted. Aortic Atherosclerosis   NO NEW EXAMS.    LABS REVIEWED PREVIOUS :   12-05-19: wbc 6.8; hgb 10.6; hct 36.9; mcv 87.9 plt 104; glucose 188; bun 13; creat 1.04; k+ 3.9;  na++ 136; ca 9.2 liver normal albumin 2.8 hgb a1c 7.0 urine micro-albumin <3.0 01-13-20: glucose 224; bun 14; creat 1.21; k+ 4.3; na++ 137; ca 9.4 BNP 144.0 01-20-20: glucose 152; bun 14; creat 1.03 ;k+ 4.2; na++ 140 ca 9.9 03-02-20: glucose bun 20; creat 1.19 ;k+ 4.0; na++ 138; ca 9.6 05-12-20: wbc 4.6; hgb 9.9; hct 35.5; mcv 85.1 plt 102; glucose 185; bun 19; creat 1.19 ;k+ 3.7; na++ 142; ca 9.8; GFR44 vit B 12: 388; folate 20.3 06-06-20: wbc 11.1; hgb 10.7; hct 38.4 mcv 84.6 plt 92; glucose 237; bun 16; creat 1.32; k+ 3.4; na++ 137; ca 9.4 GFR38; urine culture multiple species 06-08-20: wbc 8.5; hgb 9.2; hct 32.8; mcv 84.8 plt 96; glucose 251; bun 17; creat 1.25; k+ 3.6; na++ 137; ca 9.4 GFR41 06-11-20: glucose 189; bun 16; creat 1.27; k+ 3.6; na++ 136; ca 9.4 GFR 40 06-18-20: hgb a1c 7.4  08-18-20: glucose 136; bun 19; creat 1.23; k+ 4.0; na++ 139; ca 8.6; GFR 42 09-03-20: wbc 4.9; hgb 8.1; hct 9.2; mcv 80,7 plt 98; glucose 136; bun 16; creat 1.18; k+ 3.5; na++ 140; ca 8.6; GFR 44; liver normal albumin 2.8  10-29-20: glucose 159; bun 21; creat 1.21; k+ 2.9; na++ 139; ca 9.0; GFR 43; liver normal albumin 3.4  NO NEW LABS.    Review of Systems  Constitutional:  Negative for malaise/fatigue.  Respiratory:  Negative for cough and shortness of breath.   Cardiovascular:  Negative for chest pain, palpitations and leg swelling.  Gastrointestinal:  Negative for abdominal pain, constipation and heartburn.  Musculoskeletal:  Negative for back pain, joint pain and myalgias.  Skin:  Negative.   Neurological:  Negative for dizziness.  Psychiatric/Behavioral:  The patient is not nervous/anxious.    Physical Exam Constitutional:      General: She is not in acute distress.    Appearance: She is well-developed. She is not diaphoretic.  Neck:     Thyroid: No thyromegaly.  Cardiovascular:     Rate and Rhythm: Normal rate and regular rhythm.     Pulses: Normal pulses.     Heart sounds: Normal heart sounds.  Pulmonary:     Effort: Pulmonary effort is normal. No respiratory distress.     Breath sounds: Normal breath sounds.  Abdominal:     General: Bowel sounds are normal. There is no distension.     Palpations: Abdomen is soft.     Tenderness: There is no abdominal tenderness.  Musculoskeletal:     Cervical back: Neck supple.     Right lower leg: Edema present.     Left lower leg: Edema present.     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  Skin:    General: Skin is warm and dry.  Neurological:     Mental Status: She is alert. Mental status is at baseline.  Psychiatric:        Mood and Affect: Mood normal.      ASSESSMENT/ PLAN:  TODAY:   Unspecified COPD/ chronic respiratory failure with hypoxia: is stable is 02 dependent will continue trelegy ellipta 100-62.5-25 mcg 1 puff daily   2. GERD without esophagitis: is stable will continue protonix 40 mg twice daily   3. Vascular dementia without behavioral disturbance: is stable weight is 206 pounds; will continue aricept 5 mg daily   4.  CKD stage 3 due to type 2 diabetes mellitus: is stable bun 21 creat 1.21 GFR 43   PREVIOUS   5. Diabetic peripheral neuropathy associated with type 2 diabetes mellitus: is stable will continue lyrica 50 mg twice daily   6. Fibromyalgia: is stable will continue lyrica 50 mg twice daily and cymbalta 40 mg daily    7. Rheumatoid arthritis: is stable will continue methotrexate 12.5 mg weekly and folic acid  8. Post menopausal osteoporosis: is stable t score -4.997 (11-11-20-18) will continue prolia 60 mg every 6 months.  9. Chronic constipation: : is stable will continue miralax daily as needed  10. Type 2 diabetes mellitus with peripheral neuropathy: is stable hgb a1c 7.5 is off tradjenta is on eliquis  11. Dyslipidemia associated with type 2 diabetes mellitus: is stable LDL 68 is off crestor will monitor   12. Atrial fibrillation with RVR is stable will continue cardizem cd 300 mg daily for rate control and eliquis 2.5 mg twice daily   13. Chronic diastolic heart failure EF 55-60% (07-28-16) will continue lasix 40 mg twice daily with k+ 40 meq daily will monitor   14. Aortic atherosclerosis (cxr 03-09-19) will monitor   15. Hypokalemia: is stable k+ 3.5 will continue k+ 40 meq daily   16. Unspecified COPD/chronic respiratory failure with hypoxia: is stable is 02 dependent will continue trelegy ellipta 100-62.5-25 mcg 1 puff daily   Ok Edwards NP [facility]  Contact [phone] Monday through Friday 8am- 5pm  After hours call [phone]

## 2020-11-23 ENCOUNTER — Non-Acute Institutional Stay (SKILLED_NURSING_FACILITY): Payer: Medicare Other | Admitting: Adult Health

## 2020-11-23 ENCOUNTER — Encounter (HOSPITAL_COMMUNITY)
Admission: AD | Admit: 2020-11-23 | Discharge: 2020-11-23 | Disposition: A | Discharge status: Home / Self Care | Payer: Medicare Other | Source: Skilled Nursing Facility | Attending: Adult Health | Admitting: Adult Health

## 2020-11-23 ENCOUNTER — Encounter: Payer: Self-pay | Admitting: Adult Health

## 2020-11-23 DIAGNOSIS — E876 Hypokalemia: Secondary | ICD-10-CM | POA: Insufficient documentation

## 2020-11-23 DIAGNOSIS — I5032 Chronic diastolic (congestive) heart failure: Secondary | ICD-10-CM

## 2020-11-23 LAB — POTASSIUM: Potassium: 3.1 mmol/L — ABNORMAL LOW (ref 3.5–5.1)

## 2020-11-23 NOTE — Progress Notes (Signed)
Location:  [facility] Room Number: 106-D Place of Service:  SNF (31)   CODE STATUS: DNR  Allergies  Allergen Reactions   Other     Band Aids    Tape    Lipitor [Atorvastatin Calcium] Rash and Other (See Comments)    Muscle pain   Niaspan [Niacin Er] Rash and Other (See Comments)    Muscle pain    Chief Complaint  Patient presents with   Acute Visit    Follow-up labs    HPI:  Her k+ is 3.1. she is taking k+ 40 meq three times daily and torsemide 80 mg twice daily. Her kidneys are not able to tolerate this dosage. She continues to bilateral lower extremity edema. There are no reports of cough or shortness of breath. No chest pain.   Past Medical History:  Diagnosis Date   Anemia    Atrial fibrillation (HCC)    Back pain    Bacterial pneumonia    Cancer ([place])    Skin    Chronic respiratory failure ([place])    Cognitive communication deficit    COPD (chronic obstructive pulmonary disease) (HCC)    Dementia ([place])    Depression    Diabetes mellitus without complication ([place])    Diverticulosis    Fatty liver    Fibromyalgia    Gastric polyp 09/08/11   at the anastomosis-inflammatory   Generalized anxiety disorder    GERD (gastroesophageal reflux disease)    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)    Hypercholesteremia    Hypertension    Insomnia    Internal hemorrhoids    Muscle weakness    Neuropathy    On home O2    qhs   Osteoporosis    Rheumatoid arteritis ([place])    Ulcer    stomach   Vitamin D deficiency     Past Surgical History:  Procedure Laterality Date   ABDOMINAL HYSTERECTOMY     ABDOMINAL SURGERY     removed patial stomach from ulcer   COLONOSCOPY  04-2001   internal hemorrhoids, panocolonic diverticulosis, and colon polyps    UPPER GASTROINTESTINAL ENDOSCOPY      Social History   Socioeconomic History   Marital status: Widowed    Spouse name: Not on file   Number of children: 6   Years of education: Not on file    Highest education level: Not on file  Occupational History   Occupation: Retired  Tobacco Use   Smoking status: Never   Smokeless tobacco: Never  Vaping Use   Vaping Use: Never used  Substance and Sexual Activity   Alcohol use: No   Drug use: No   Sexual activity: Not Currently  Other Topics Concern   Not on file  Social History Narrative   Long term resident of [facility]    Social Determinants of Health   Financial Resource Strain: Not on file  Food Insecurity: Not on file  Transportation Needs: Not on file  Physical Activity: Not on file  Stress: Not on file  Social Connections: Not on file  Intimate Partner Violence: Not on file   Family History  Problem Relation Age of Onset   Heart disease Father    Kidney disease Mother        kidney cancer   Colon cancer Daughter 4      VITAL SIGNS BP 118/64   Pulse 93   Temp (!) 96.7 F (35.9 C)   Resp 20   Ht 5\' 3"  (  1.6 m)   Wt 206 lb 12.8 oz (93.8 kg)   SpO2 90%   BMI 36.63 kg/m   Outpatient Encounter Medications as of 11/23/2020  Medication Sig   acetaminophen (TYLENOL) 650 MG CR tablet Take 650 mg by mouth every 6 (six) hours.   Amino Acids-Protein Hydrolys (PRO-STAT) LIQD Take by mouth. liquid; 15-100 gram-kcal/30 mL; oral Three Times A Day  Special Instructions: for albumin 2.8   apixaban (ELIQUIS) 2.5 MG TABS tablet Take 2.5 mg by mouth 2 (two) times daily.   denosumab (PROLIA) 60 MG/ML SOSY injection Inject 60 mg into the skin every 6 (six) months.   diltiazem (CARDIZEM CD) 300 MG 24 hr capsule Take 300 mg by mouth daily.   donepezil (ARICEPT) 10 MG tablet Take 10 mg by mouth at bedtime. For unspecified dementia without behavioral disturbance]   DULoxetine (CYMBALTA) 20 MG capsule Take 40 mg by mouth daily.    Fluticasone-Umeclidin-Vilant (TRELEGY ELLIPTA) 100-62.5-25 MCG/INH AEPB One puff inhalation once a day   folic acid (FOLVITE) 1 MG tablet Take 5 mg by mouth as directed. Once a day on Sunday    guaiFENesin (MUCINEX) 600 MG 12 hr tablet Take 600 mg by mouth 2 (two) times daily.   methotrexate (RHEUMATREX) 2.5 MG tablet Take 15 mg by mouth once a week. On Sunday - Caution:Chemotherapy. Protect from light.   miconazole (ZEASORB-AF) 2 % powder Apply 1 application topically 2 (two) times daily as needed.   NON FORMULARY Diet Type:  NAS,  Consistent Carbohydrates, Dysphagia 3   ondansetron (ZOFRAN) 4 MG tablet Take 4 mg by mouth every 6 (six) hours as needed for nausea or vomiting.   OXYGEN Inhale 3.5 L/min into the lungs continuous.   pantoprazole (PROTONIX) 40 MG tablet Take 40 mg by mouth 2 (two) times daily.   phenylephrine-shark liver oil-mineral oil-petrolatum (PREPARATION H) 0.25-3-14-71.9 % rectal ointment Place 1 application rectally 2 (two) times daily as needed for hemorrhoids.   polyethylene glycol (MIRALAX / GLYCOLAX) packet Take 17 g by mouth daily as needed for moderate constipation.   potassium chloride SA (K-DUR,KLOR-CON) 20 MEQ tablet Take 40 mEq by mouth 3 (three) times daily.   pregabalin (LYRICA) 50 MG capsule Take 1 capsule by mouth  Twice daily   torsemide (DEMADEX) 20 MG tablet Take 80 mg by mouth 2 (two) times daily.   traMADol (ULTRAM) 50 MG tablet Take 0.5 tablets (25 mg total) by mouth at bedtime.   vitamin B-12 (CYANOCOBALAMIN) 1000 MCG tablet Take 1,000 mcg by mouth daily. 8 am for b12 level of 388   No facility-administered encounter medications on file as of 11/23/2020.     SIGNIFICANT DIAGNOSTIC EXAMS   PREVIOUS;    01-20-19: DEXA: t score -4.997  05-10-20: chest x-ray: interstitial edema peribronchial cuffing; cardiomegaly; pulmonary vascular redistribution  10-26-20: chest x-ray:  Mild bibasilar subsegmental atelectasis or edema is noted. Small right pleural effusion is noted. Aortic Atherosclerosis   NO NEW EXAMS.    LABS REVIEWED PREVIOUS :   12-05-19: wbc 6.8; hgb 10.6; hct 36.9; mcv 87.9 plt 104; glucose 188; bun 13; creat 1.04; k+ 3.9; na++  136; ca 9.2 liver normal albumin 2.8 hgb a1c 7.0 urine micro-albumin <3.0 01-13-20: glucose 224; bun 14; creat 1.21; k+ 4.3; na++ 137; ca 9.4 BNP 144.0 01-20-20: glucose 152; bun 14; creat 1.03 ;k+ 4.2; na++ 140 ca 9.9 03-02-20: glucose bun 20; creat 1.19 ;k+ 4.0; na++ 138; ca 9.6 05-12-20: wbc 4.6; hgb 9.9; hct 35.5; mcv 85.1  plt 102; glucose 185; bun 19; creat 1.19 ;k+ 3.7; na++ 142; ca 9.8; GFR44 vit B 12: 388; folate 20.3 06-06-20: wbc 11.1; hgb 10.7; hct 38.4 mcv 84.6 plt 92; glucose 237; bun 16; creat 1.32; k+ 3.4; na++ 137; ca 9.4 GFR38; urine culture multiple species 06-08-20: wbc 8.5; hgb 9.2; hct 32.8; mcv 84.8 plt 96; glucose 251; bun 17; creat 1.25; k+ 3.6; na++ 137; ca 9.4 GFR41 06-11-20: glucose 189; bun 16; creat 1.27; k+ 3.6; na++ 136; ca 9.4 GFR 40 06-18-20: hgb a1c 7.4  08-18-20: glucose 136; bun 19; creat 1.23; k+ 4.0; na++ 139; ca 8.6; GFR 42 09-03-20: wbc 4.9; hgb 8.1; hct 9.2; mcv 80,7 plt 98; glucose 136; bun 16; creat 1.18; k+ 3.5; na++ 140; ca 8.6; GFR 44; liver normal albumin 2.8  10-29-20: glucose 159; bun 21; creat 1.21; k+ 2.9; na++ 139; ca 9.0; GFR 43; liver normal albumin 3.4  TODAY  10-30-20: k+ 3.1 11-23-20: k+ 3.1    Review of Systems  Constitutional:  Negative for malaise/fatigue.  Respiratory:  Negative for cough and shortness of breath.   Cardiovascular:  Positive for leg swelling. Negative for chest pain and palpitations.  Gastrointestinal:  Negative for abdominal pain, constipation and heartburn.  Musculoskeletal:  Negative for back pain, joint pain and myalgias.  Skin: Negative.   Neurological:  Negative for dizziness.  Psychiatric/Behavioral:  The patient is not nervous/anxious.    Physical Exam Constitutional:      General: She is not in acute distress.    Appearance: She is well-developed. She is not diaphoretic.  Neck:     Thyroid: No thyromegaly.  Cardiovascular:     Rate and Rhythm: Normal rate and regular rhythm.     Pulses: Normal pulses.      Heart sounds: Normal heart sounds.  Pulmonary:     Effort: Pulmonary effort is normal. No respiratory distress.     Breath sounds: Normal breath sounds.  Abdominal:     General: Bowel sounds are normal. There is no distension.     Palpations: Abdomen is soft.     Tenderness: There is no abdominal tenderness.  Musculoskeletal:        General: Normal range of motion.     Cervical back: Neck supple.     Right lower leg: Edema present.     Left lower leg: Edema present.     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  Skin:    General: Skin is warm and dry.  Neurological:     Mental Status: She is alert. Mental status is at baseline.  Psychiatric:        Mood and Affect: Mood normal.     ASSESSMENT/ PLAN:  TODAY  Chronic diastolic congestive heart failure Hypokalemia  Will give k+ 60 meq extra today Will lower torsemide 40 mg twice daily  Will check BMP 11-26-20   Ok Edwards NP [facility]  Contact [phone] Monday through Friday 8am- 5pm  After hours call [phone]

## 2020-11-26 ENCOUNTER — Other Ambulatory Visit (HOSPITAL_COMMUNITY)
Admission: RE | Admit: 2020-11-26 | Discharge: 2020-11-26 | Disposition: A | Discharge status: Home / Self Care | Payer: Medicare Other | Source: Skilled Nursing Facility | Attending: Adult Health | Admitting: Adult Health

## 2020-11-26 DIAGNOSIS — I130 Hypertensive heart and chronic kidney disease with heart failure and stage 1 through stage 4 chronic kidney disease, or unspecified chronic kidney disease: Secondary | ICD-10-CM | POA: Insufficient documentation

## 2020-11-26 LAB — BASIC METABOLIC PANEL
Anion gap: 6 (ref 5–15)
BUN: 34 mg/dL — ABNORMAL HIGH (ref 8–23)
CO2: 31 mmol/L (ref 22–32)
Calcium: 9.2 mg/dL (ref 8.9–10.3)
Chloride: 101 mmol/L (ref 98–111)
Creatinine, Ser: 1.34 mg/dL — ABNORMAL HIGH (ref 0.44–1.00)
GFR, Estimated: 38 mL/min — ABNORMAL LOW (ref >60)
Glucose, Bld: 178 mg/dL — ABNORMAL HIGH (ref 70–99)
Potassium: 3.7 mmol/L (ref 3.5–5.1)
Sodium: 138 mmol/L (ref 135–145)

## 2020-12-11 ENCOUNTER — Non-Acute Institutional Stay (SKILLED_NURSING_FACILITY): Payer: Medicare Other | Admitting: Adult Health

## 2020-12-11 ENCOUNTER — Encounter: Payer: Self-pay | Admitting: Adult Health

## 2020-12-11 DIAGNOSIS — I700 Atherosclerosis of aorta: Secondary | ICD-10-CM | POA: Diagnosis not present

## 2020-12-11 DIAGNOSIS — J9611 Chronic respiratory failure with hypoxia: Secondary | ICD-10-CM

## 2020-12-11 DIAGNOSIS — I5032 Chronic diastolic (congestive) heart failure: Secondary | ICD-10-CM | POA: Diagnosis not present

## 2020-12-11 NOTE — Progress Notes (Signed)
Location:  [facility] Room Number: 106-D Place of Service:  SNF (31)   CODE STATUS: DNR  Allergies  Allergen Reactions   Other     Band Aids    Tape    Lipitor [Atorvastatin Calcium] Rash and Other (See Comments)    Muscle pain   Niaspan [Niacin Er] Rash and Other (See Comments)    Muscle pain    Chief Complaint  Patient presents with   Acute Visit    Care plan meeting    HPI:  We have come together for her care plan meeting. . 6/15 BIMS; mood 6/30: feels down; tired; inattention; disorganized thinking. She requires limited assist with her adls. She is occasionally incontinent of bladder and frequently incontinent of bowel. She has had 2 falls without injury. Her cbg readings have been stable. Is on chronic 02. Therapy none at this time.  Dietary feeds self .weight 206 pounds stable; appetite is good; mechanical diet.   She continues to be followed for her chronic illnesses including:  Aortic atherosclerosis  Chronic diastolic congestive heart failure  Chronic respiratory failure with hypoxia  Past Medical History:  Diagnosis Date   Anemia    Atrial fibrillation (HCC)    Back pain    Bacterial pneumonia    Cancer ([place])    Skin    Chronic respiratory failure (HCC)    Cognitive communication deficit    COPD (chronic obstructive pulmonary disease) (HCC)    Dementia ([place])    Depression    Diabetes mellitus without complication ([place])    Diverticulosis    Fatty liver    Fibromyalgia    Gastric polyp 09/08/11   at the anastomosis-inflammatory   Generalized anxiety disorder    GERD (gastroesophageal reflux disease)    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)    Hypercholesteremia    Hypertension    Insomnia    Internal hemorrhoids    Muscle weakness    Neuropathy    On home O2    qhs   Osteoporosis    Rheumatoid arteritis ([place])    Ulcer    stomach   Vitamin D deficiency     Past Surgical History:  Procedure Laterality Date   ABDOMINAL  HYSTERECTOMY     ABDOMINAL SURGERY     removed patial stomach from ulcer   COLONOSCOPY  04-2001   internal hemorrhoids, panocolonic diverticulosis, and colon polyps    UPPER GASTROINTESTINAL ENDOSCOPY      Social History   Socioeconomic History   Marital status: Widowed    Spouse name: Not on file   Number of children: 6   Years of education: Not on file   Highest education level: Not on file  Occupational History   Occupation: Retired  Tobacco Use   Smoking status: Never   Smokeless tobacco: Never  Vaping Use   Vaping Use: Never used  Substance and Sexual Activity   Alcohol use: No   Drug use: No   Sexual activity: Not Currently  Other Topics Concern   Not on file  Social History Narrative   Long term resident of [facility]    Social Determinants of Health   Financial Resource Strain: Not on file  Food Insecurity: Not on file  Transportation Needs: Not on file  Physical Activity: Not on file  Stress: Not on file  Social Connections: Not on file  Intimate Partner Violence: Not on file   Family History  Problem Relation Age of Onset   Heart disease  Father    Kidney disease Mother        kidney cancer   Colon cancer Daughter 4      VITAL SIGNS BP 138/76   Pulse 72   Temp 98.1 F (36.7 C)   Resp 20   Ht 5\' 3"  (1.6 m)   Wt 206 lb 12.8 oz (93.8 kg)   SpO2 98%   BMI 36.63 kg/m   Outpatient Encounter Medications as of 12/11/2020  Medication Sig   acetaminophen (TYLENOL) 650 MG CR tablet Take 650 mg by mouth every 8 (eight) hours.   Amino Acids-Protein Hydrolys (PRO-STAT) LIQD Take by mouth. liquid; 15-100 gram-kcal/30 mL; oral Three Times A Day  Special Instructions: for albumin 2.8   apixaban (ELIQUIS) 2.5 MG TABS tablet Take 2.5 mg by mouth 2 (two) times daily.   denosumab (PROLIA) 60 MG/ML SOSY injection Inject 60 mg into the skin every 6 (six) months.   diltiazem (CARDIZEM CD) 300 MG 24 hr capsule Take 300 mg by mouth daily.   donepezil (ARICEPT) 10 MG  tablet Take 10 mg by mouth at bedtime. For unspecified dementia without behavioral disturbance]   DULoxetine (CYMBALTA) 20 MG capsule Take 40 mg by mouth daily.    Fluticasone-Umeclidin-Vilant (TRELEGY ELLIPTA) 100-62.5-25 MCG/INH AEPB One puff inhalation once a day   folic acid (FOLVITE) 1 MG tablet Take 5 mg by mouth as directed. Once a day on Sunday   guaiFENesin (MUCINEX) 600 MG 12 hr tablet Take 600 mg by mouth 2 (two) times daily.   methotrexate (RHEUMATREX) 2.5 MG tablet Take 15 mg by mouth once a week. On Sunday - Caution:Chemotherapy. Protect from light.   miconazole (ZEASORB-AF) 2 % powder Apply 1 application topically 2 (two) times daily as needed.   NON FORMULARY Diet Type:  NAS,  Consistent Carbohydrates, Dysphagia 3   ondansetron (ZOFRAN) 4 MG tablet Take 4 mg by mouth every 6 (six) hours as needed for nausea or vomiting.   OXYGEN Inhale 3.5 L/min into the lungs continuous.   pantoprazole (PROTONIX) 40 MG tablet Take 40 mg by mouth 2 (two) times daily.   phenylephrine-shark liver oil-mineral oil-petrolatum (PREPARATION H) 0.25-3-14-71.9 % rectal ointment Place 1 application rectally 2 (two) times daily as needed for hemorrhoids.   polyethylene glycol (MIRALAX / GLYCOLAX) packet Take 17 g by mouth daily as needed for moderate constipation.   potassium chloride SA (K-DUR,KLOR-CON) 20 MEQ tablet Take 40 mEq by mouth 3 (three) times daily.   pregabalin (LYRICA) 50 MG capsule Take 1 capsule by mouth  Twice daily   torsemide (DEMADEX) 20 MG tablet Take 80 mg by mouth 2 (two) times daily.   traMADol (ULTRAM) 50 MG tablet Take 0.5 tablets (25 mg total) by mouth at bedtime.   vitamin B-12 (CYANOCOBALAMIN) 1000 MCG tablet Take 1,000 mcg by mouth daily. 8 am for b12 level of 388   No facility-administered encounter medications on file as of 12/11/2020.     SIGNIFICANT DIAGNOSTIC EXAMS  PREVIOUS;    01-20-19: DEXA: t score -4.997  05-10-20: chest x-ray: interstitial edema peribronchial  cuffing; cardiomegaly; pulmonary vascular redistribution  10-26-20: chest x-ray:  Mild bibasilar subsegmental atelectasis or edema is noted. Small right pleural effusion is noted. Aortic Atherosclerosis   NO NEW EXAMS.    LABS REVIEWED PREVIOUS :   12-05-19: wbc 6.8; hgb 10.6; hct 36.9; mcv 87.9 plt 104; glucose 188; bun 13; creat 1.04; k+ 3.9; na++ 136; ca 9.2 liver normal albumin 2.8 hgb a1c 7.0 urine micro-albumin <  3.0 01-13-20: glucose 224; bun 14; creat 1.21; k+ 4.3; na++ 137; ca 9.4 BNP 144.0 01-20-20: glucose 152; bun 14; creat 1.03 ;k+ 4.2; na++ 140 ca 9.9 03-02-20: glucose bun 20; creat 1.19 ;k+ 4.0; na++ 138; ca 9.6 05-12-20: wbc 4.6; hgb 9.9; hct 35.5; mcv 85.1 plt 102; glucose 185; bun 19; creat 1.19 ;k+ 3.7; na++ 142; ca 9.8; GFR44 vit B 12: 388; folate 20.3 06-06-20: wbc 11.1; hgb 10.7; hct 38.4 mcv 84.6 plt 92; glucose 237; bun 16; creat 1.32; k+ 3.4; na++ 137; ca 9.4 GFR38; urine culture multiple species 06-08-20: wbc 8.5; hgb 9.2; hct 32.8; mcv 84.8 plt 96; glucose 251; bun 17; creat 1.25; k+ 3.6; na++ 137; ca 9.4 GFR41 06-11-20: glucose 189; bun 16; creat 1.27; k+ 3.6; na++ 136; ca 9.4 GFR 40 06-18-20: hgb a1c 7.4  08-18-20: glucose 136; bun 19; creat 1.23; k+ 4.0; na++ 139; ca 8.6; GFR 42 09-03-20: wbc 4.9; hgb 8.1; hct 9.2; mcv 80,7 plt 98; glucose 136; bun 16; creat 1.18; k+ 3.5; na++ 140; ca 8.6; GFR 44; liver normal albumin 2.8  10-29-20: glucose 159; bun 21; creat 1.21; k+ 2.9; na++ 139; ca 9.0; GFR 43; liver normal albumin 3.4  TODAY  10-30-20: k+ 3.1 11-23-20: k+ 3.1   11-26-20: glucose 178; bun 34; creat 1.34; k+ 3.7; na++ 138; ca 9.2; GFR 38  Review of Systems  Constitutional:  Negative for malaise/fatigue.  Respiratory:  Negative for cough and shortness of breath.   Cardiovascular:  Positive for leg swelling. Negative for chest pain and palpitations.  Gastrointestinal:  Negative for abdominal pain, constipation and heartburn.  Musculoskeletal:  Negative for back pain,  joint pain and myalgias.  Skin: Negative.   Neurological:  Negative for dizziness.  Psychiatric/Behavioral:  The patient is not nervous/anxious.    Physical Exam Constitutional:      General: She is not in acute distress.    Appearance: She is well-developed. She is not diaphoretic.  Neck:     Thyroid: No thyromegaly.  Cardiovascular:     Rate and Rhythm: Normal rate and regular rhythm.     Pulses: Normal pulses.     Heart sounds: Normal heart sounds.  Pulmonary:     Effort: Pulmonary effort is normal. No respiratory distress.     Breath sounds: Normal breath sounds.  Abdominal:     General: Bowel sounds are normal. There is no distension.     Palpations: Abdomen is soft.     Tenderness: There is no abdominal tenderness.  Musculoskeletal:     Cervical back: Neck supple.     Right lower leg: Edema present.     Left lower leg: Edema present.     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  Skin:    General: Skin is warm and dry.  Neurological:     Mental Status: She is alert. Mental status is at baseline.  Psychiatric:        Mood and Affect: Mood normal.      ASSESSMENT/ PLAN:  TODAY  Aortic atherosclerosis Chronic diastolic congestive heart failure Chronic respiratory failure with hypoxia  Will continue current medications Will continue current plan of care Will continue to monitor her status.    Time spent with patient 40 minutes: medications; plan of care; therapy needs.    Ok Edwards  NP [facility]  Contact [phone] Monday through Friday 8am- 5pm  After hours call [phone]

## 2020-12-14 ENCOUNTER — Encounter: Payer: Self-pay | Admitting: Adult Health

## 2020-12-14 ENCOUNTER — Other Ambulatory Visit: Payer: Self-pay | Admitting: Adult Health

## 2020-12-14 ENCOUNTER — Non-Acute Institutional Stay (SKILLED_NURSING_FACILITY): Payer: Medicare Other | Admitting: Adult Health

## 2020-12-14 DIAGNOSIS — E1142 Type 2 diabetes mellitus with diabetic polyneuropathy: Secondary | ICD-10-CM

## 2020-12-14 DIAGNOSIS — M797 Fibromyalgia: Secondary | ICD-10-CM

## 2020-12-14 DIAGNOSIS — M059 Rheumatoid arthritis with rheumatoid factor, unspecified: Secondary | ICD-10-CM | POA: Diagnosis not present

## 2020-12-14 DIAGNOSIS — M810 Age-related osteoporosis without current pathological fracture: Secondary | ICD-10-CM

## 2020-12-14 MED ORDER — PREGABALIN 50 MG PO CAPS
ORAL_CAPSULE | ORAL | 0 refills | Status: DC
Start: 2020-12-14 — End: 2021-01-12

## 2020-12-14 MED ORDER — TRAMADOL HCL 50 MG PO TABS: 25.0000 mg | ORAL_TABLET | Freq: Every day | ORAL | 0 refills | Status: DC

## 2020-12-14 NOTE — Progress Notes (Signed)
Location:  [facility] Room Number: 106-D Place of Service:  SNF (31)   CODE STATUS: DNR  Allergies  Allergen Reactions   Other     Band Aids    Tape    Lipitor [Atorvastatin Calcium] Rash and Other (See Comments)    Muscle pain   Niaspan [Niacin Er] Rash and Other (See Comments)    Muscle pain    Chief Complaint  Patient presents with   Medical Management of Chronic Issues           Diabetic peripheral neuropathy associated with type 2 diabetes mellitus:   Fibromyalgia:  Rheumatoid arthritis:   Post menopausal osteoporosis    HPI:  She is a 87 year old long term resident of this facility being seen for the management of her chronic illnesses: Diabetic peripheral neuropathy associated with type 2 diabetes mellitus:   Fibromyalgia:  Rheumatoid arthritis:   Post menopausal osteoporosis. There are no reports of uncontrolled pain; no changes in appetite; no reports of anxiety or depressive thoughts.   Past Medical History:  Diagnosis Date   Anemia    Atrial fibrillation (HCC)    Back pain    Bacterial pneumonia    Cancer ([place])    Skin    Chronic respiratory failure ([place])    Cognitive communication deficit    COPD (chronic obstructive pulmonary disease) (HCC)    Dementia ([place])    Depression    Diabetes mellitus without complication ([place])    Diverticulosis    Fatty liver    Fibromyalgia    Gastric polyp 09/08/11   at the anastomosis-inflammatory   Generalized anxiety disorder    GERD (gastroesophageal reflux disease)    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)    Hypercholesteremia    Hypertension    Insomnia    Internal hemorrhoids    Muscle weakness    Neuropathy    On home O2    qhs   Osteoporosis    Rheumatoid arteritis ([place])    Ulcer    stomach   Vitamin D deficiency     Past Surgical History:  Procedure Laterality Date   ABDOMINAL HYSTERECTOMY     ABDOMINAL SURGERY     removed patial stomach from ulcer   COLONOSCOPY  04-2001    internal hemorrhoids, panocolonic diverticulosis, and colon polyps    UPPER GASTROINTESTINAL ENDOSCOPY      Social History   Socioeconomic History   Marital status: Widowed    Spouse name: Not on file   Number of children: 6   Years of education: Not on file   Highest education level: Not on file  Occupational History   Occupation: Retired  Tobacco Use   Smoking status: Never   Smokeless tobacco: Never  Vaping Use   Vaping Use: Never used  Substance and Sexual Activity   Alcohol use: No   Drug use: No   Sexual activity: Not Currently  Other Topics Concern   Not on file  Social History Narrative   Long term resident of [facility]    Social Determinants of Health   Financial Resource Strain: Not on file  Food Insecurity: Not on file  Transportation Needs: Not on file  Physical Activity: Not on file  Stress: Not on file  Social Connections: Not on file  Intimate Partner Violence: Not on file   Family History  Problem Relation Age of Onset   Heart disease Father    Kidney disease Mother  kidney cancer   Colon cancer Daughter 54      VITAL SIGNS BP (!) 112/51   Pulse 77   Temp (!) 97.2 F (36.2 C)   Resp 20   Ht 5\' 3"  (1.6 m)   Wt 206 lb 12.8 oz (93.8 kg)   SpO2 93%   BMI 36.63 kg/m   Outpatient Encounter Medications as of 12/14/2020  Medication Sig   acetaminophen (TYLENOL) 650 MG CR tablet Take 650 mg by mouth every 8 (eight) hours.   Amino Acids-Protein Hydrolys (PRO-STAT) LIQD Take by mouth. liquid; 15-100 gram-kcal/30 mL; oral Three Times A Day  Special Instructions: for albumin 2.8   apixaban (ELIQUIS) 2.5 MG TABS tablet Take 2.5 mg by mouth 2 (two) times daily.   denosumab (PROLIA) 60 MG/ML SOSY injection Inject 60 mg into the skin every 6 (six) months.   diltiazem (CARDIZEM CD) 300 MG 24 hr capsule Take 300 mg by mouth daily.   donepezil (ARICEPT) 10 MG tablet Take 10 mg by mouth at bedtime. For unspecified dementia without behavioral disturbance]    DULoxetine (CYMBALTA) 20 MG capsule Take 40 mg by mouth daily.    Fluticasone-Umeclidin-Vilant (TRELEGY ELLIPTA) 100-62.5-25 MCG/INH AEPB One puff inhalation once a day   folic acid (FOLVITE) 1 MG tablet Take 5 mg by mouth as directed. Once a day on Sunday   guaiFENesin (MUCINEX) 600 MG 12 hr tablet Take 600 mg by mouth 2 (two) times daily.   methotrexate (RHEUMATREX) 2.5 MG tablet Take 15 mg by mouth once a week. On Sunday - Caution:Chemotherapy. Protect from light.   miconazole (ZEASORB-AF) 2 % powder Apply 1 application topically 2 (two) times daily as needed.   NON FORMULARY Diet Type:  NAS,  Consistent Carbohydrates, Dysphagia 3   ondansetron (ZOFRAN) 4 MG tablet Take 4 mg by mouth every 6 (six) hours as needed for nausea or vomiting.   OXYGEN Inhale 3.5 L/min into the lungs continuous.   pantoprazole (PROTONIX) 40 MG tablet Take 40 mg by mouth 2 (two) times daily.   phenylephrine-shark liver oil-mineral oil-petrolatum (PREPARATION H) 0.25-3-14-71.9 % rectal ointment Place 1 application rectally 2 (two) times daily as needed for hemorrhoids.   polyethylene glycol (MIRALAX / GLYCOLAX) packet Take 17 g by mouth daily as needed for moderate constipation.   potassium chloride SA (K-DUR,KLOR-CON) 20 MEQ tablet Take 40 mEq by mouth 3 (three) times daily.   pregabalin (LYRICA) 50 MG capsule Take 1 capsule by mouth  Twice daily   torsemide (DEMADEX) 20 MG tablet Take 80 mg by mouth 2 (two) times daily.   traMADol (ULTRAM) 50 MG tablet Take 0.5 tablets (25 mg total) by mouth at bedtime.   vitamin B-12 (CYANOCOBALAMIN) 1000 MCG tablet Take 1,000 mcg by mouth daily. 8 am for b12 level of 388   No facility-administered encounter medications on file as of 12/14/2020.     SIGNIFICANT DIAGNOSTIC EXAMS  PREVIOUS;    01-20-19: DEXA: t score -4.997  05-10-20: chest x-ray: interstitial edema peribronchial cuffing; cardiomegaly; pulmonary vascular redistribution  10-26-20: chest x-ray:  Mild bibasilar  subsegmental atelectasis or edema is noted. Small right pleural effusion is noted. Aortic Atherosclerosis   NO NEW EXAMS.    LABS REVIEWED PREVIOUS :   01-13-20: glucose 224; bun 14; creat 1.21; k+ 4.3; na++ 137; ca 9.4 BNP 144.0 01-20-20: glucose 152; bun 14; creat 1.03 ;k+ 4.2; na++ 140 ca 9.9 03-02-20: glucose bun 20; creat 1.19 ;k+ 4.0; na++ 138; ca 9.6 05-12-20: wbc 4.6; hgb  9.9; hct 35.5; mcv 85.1 plt 102; glucose 185; bun 19; creat 1.19 ;k+ 3.7; na++ 142; ca 9.8; GFR44 vit B 12: 388; folate 20.3 06-06-20: wbc 11.1; hgb 10.7; hct 38.4 mcv 84.6 plt 92; glucose 237; bun 16; creat 1.32; k+ 3.4; na++ 137; ca 9.4 GFR38; urine culture multiple species 06-08-20: wbc 8.5; hgb 9.2; hct 32.8; mcv 84.8 plt 96; glucose 251; bun 17; creat 1.25; k+ 3.6; na++ 137; ca 9.4 GFR41 06-11-20: glucose 189; bun 16; creat 1.27; k+ 3.6; na++ 136; ca 9.4 GFR 40 06-18-20: hgb a1c 7.4  08-18-20: glucose 136; bun 19; creat 1.23; k+ 4.0; na++ 139; ca 8.6; GFR 42 09-03-20: wbc 4.9; hgb 8.1; hct 9.2; mcv 80,7 plt 98; glucose 136; bun 16; creat 1.18; k+ 3.5; na++ 140; ca 8.6; GFR 44; liver normal albumin 2.8  10-29-20: glucose 159; bun 21; creat 1.21; k+ 2.9; na++ 139; ca 9.0; GFR 43; liver normal albumin 3.4 10-30-20: k+ 3.1 11-23-20: k+ 3.1  TODAY  11-26-20: glucose 178; bun 34; creat 1.34; k+ 3.7; na++ 138; ca 9.2 GFR 38    Review of Systems  Constitutional:  Negative for malaise/fatigue.  Respiratory:  Negative for cough and shortness of breath.   Cardiovascular:  Negative for chest pain, palpitations and leg swelling.  Gastrointestinal:  Negative for abdominal pain, constipation and heartburn.  Musculoskeletal:  Negative for back pain, joint pain and myalgias.  Skin: Negative.   Neurological:  Negative for dizziness.  Psychiatric/Behavioral:  The patient is not nervous/anxious.     Physical Exam Constitutional:      General: She is not in acute distress.    Appearance: She is well-developed. She is not  diaphoretic.  Neck:     Thyroid: No thyromegaly.  Cardiovascular:     Rate and Rhythm: Normal rate and regular rhythm.     Pulses: Normal pulses.     Heart sounds: Normal heart sounds.  Pulmonary:     Effort: Pulmonary effort is normal. No respiratory distress.     Breath sounds: Normal breath sounds.  Abdominal:     General: Bowel sounds are normal. There is no distension.     Palpations: Abdomen is soft.     Tenderness: There is no abdominal tenderness.  Musculoskeletal:     Cervical back: Neck supple.     Right lower leg: No edema.     Left lower leg: No edema.     Comments:  Unable to move left upper extremity due to frozen left shoulder Able to move other extremities   Has 3+ bilateral lower extremity edema  Lymphadenopathy:     Cervical: No cervical adenopathy.  Skin:    General: Skin is warm and dry.  Neurological:     Mental Status: She is alert. Mental status is at baseline.  Psychiatric:        Mood and Affect: Mood normal.     ASSESSMENT/ PLAN:   TODAY:   Diabetic peripheral neuropathy associated with type 2 diabetes mellitus: is stable will continue lyrica 50 mg twice daily   2. Fibromyalgia: is stable will continue lyrica 50 mg twice daily and cymbalta 40 mg daily   3. Rheumatoid arthritis: is stable will continue methotrexate 12.5 mg weekly with folic acid  4. Post menopausal osteoporosis: is stable t score -4.997 (01-21-19) will continue prolia 60 mg every 6 months and folic acid    PREVIOUS   5. Chronic constipation: : is stable will continue miralax daily as needed  6. Type  2 diabetes mellitus with peripheral neuropathy: is stable hgb a1c 7.5 is off tradjenta is on eliquis  7. Dyslipidemia associated with type 2 diabetes mellitus: is stable LDL 68 is off crestor will monitor   8. Atrial fibrillation with RVR is stable will continue cardizem cd 300 mg daily for rate control and eliquis 2.5 mg twice daily   9. Chronic diastolic heart failure EF  55-60% (07-28-16) will continue lasix 40 mg twice daily with k+ 40 meq daily will monitor   10. Aortic atherosclerosis (cxr 03-09-19) will monitor   11. Hypokalemia: is stable k+ 3.5 will continue k+ 40 meq daily   12. Unspecified COPD/chronic respiratory failure with hypoxia: is stable is 02 dependent will continue trelegy ellipta 100-62.5-25 mcg 1 puff daily   13. GERD without esophagitis: is stable will continue protonix 40 mg twice daily   14. Vascular dementia without behavioral disturbance: is stable weight is 206 pounds; will continue aricept 5 mg daily   15. CKD stage 3 due to type 2 diabetes mellitus: is stable bun 34 creat 1.34 GFR 38   Will check hgb a1c urine for micro-albumin   Ok Edwards NP [facility]  Contact [phone] Monday through Friday 8am- 5pm  After hours call [phone]

## 2020-12-16 DIAGNOSIS — Z23 Encounter for immunization: Secondary | ICD-10-CM | POA: Diagnosis not present

## 2020-12-18 DIAGNOSIS — Z23 Encounter for immunization: Secondary | ICD-10-CM | POA: Diagnosis not present

## 2020-12-21 ENCOUNTER — Encounter (HOSPITAL_COMMUNITY)
Admission: AD | Admit: 2020-12-21 | Discharge: 2020-12-21 | Disposition: A | Discharge status: Home / Self Care | Payer: Medicare Other | Source: Skilled Nursing Facility | Attending: Adult Health | Admitting: Adult Health

## 2020-12-21 DIAGNOSIS — Z888 Allergy status to other drugs, medicaments and biological substances status: Secondary | ICD-10-CM | POA: Diagnosis not present

## 2020-12-21 DIAGNOSIS — E1169 Type 2 diabetes mellitus with other specified complication: Secondary | ICD-10-CM | POA: Insufficient documentation

## 2020-12-21 LAB — HEMOGLOBIN A1C
Hgb A1c MFr Bld: 6.6 % — ABNORMAL HIGH (ref 4.8–5.6)
Mean Plasma Glucose: 142.72 mg/dL

## 2020-12-22 LAB — MICROALBUMIN / CREATININE URINE RATIO
Creatinine, Urine: 55.4 mg/dL
Microalb Creat Ratio: 10 mg/g creat (ref 0–29)
Microalb, Ur: 5.5 ug/mL — ABNORMAL HIGH

## 2021-01-06 DIAGNOSIS — Z1159 Encounter for screening for other viral diseases: Secondary | ICD-10-CM | POA: Diagnosis not present

## 2021-01-06 DIAGNOSIS — I129 Hypertensive chronic kidney disease with stage 1 through stage 4 chronic kidney disease, or unspecified chronic kidney disease: Secondary | ICD-10-CM | POA: Diagnosis not present

## 2021-01-06 DIAGNOSIS — J449 Chronic obstructive pulmonary disease, unspecified: Secondary | ICD-10-CM | POA: Diagnosis not present

## 2021-01-12 ENCOUNTER — Other Ambulatory Visit: Payer: Self-pay | Admitting: Adult Health

## 2021-01-12 MED ORDER — PREGABALIN 50 MG PO CAPS: ORAL_CAPSULE | ORAL | 0 refills | Status: DC

## 2021-01-12 MED ORDER — TRAMADOL HCL 50 MG PO TABS: 25.0000 mg | ORAL_TABLET | Freq: Every day | ORAL | 0 refills | Status: DC

## 2021-01-18 ENCOUNTER — Encounter: Payer: Self-pay | Admitting: Adult Health

## 2021-01-18 ENCOUNTER — Non-Acute Institutional Stay (SKILLED_NURSING_FACILITY): Payer: Medicare Other | Admitting: Adult Health

## 2021-01-18 DIAGNOSIS — I5032 Chronic diastolic (congestive) heart failure: Secondary | ICD-10-CM | POA: Diagnosis not present

## 2021-01-18 NOTE — Progress Notes (Signed)
Location:  [place] Room Number: 106-D Place of Service:  SNF (31)   CODE STATUS: DNR  Allergies  Allergen Reactions   Other     Band Aids    Tape    Lipitor [Atorvastatin Calcium] Rash and Other (See Comments)    Muscle pain   Niaspan [Niacin Er] Rash and Other (See Comments)    Muscle pain    Chief Complaint  Patient presents with   Acute Visit    Weeping legs    HPI:  She has chronic bilateral lower extremity edema. She is presently taking demadex 80 mg twice daily. Her GFR is 38. There are no reports of coughing or shortness of breath. There are no open lesions present. Her legs are weeping. She does spend all day out of bed in wheelchair   Past Medical History:  Diagnosis Date   Anemia    Atrial fibrillation (HCC)    Back pain    Bacterial pneumonia    Cancer ([place])    Skin    Chronic respiratory failure (HCC)    Cognitive communication deficit    COPD (chronic obstructive pulmonary disease) (HCC)    Dementia (HCC)    Depression    Diabetes mellitus without complication ([place])    Diverticulosis    Fatty liver    Fibromyalgia    Gastric polyp 09/08/11   at the anastomosis-inflammatory   Generalized anxiety disorder    GERD (gastroesophageal reflux disease)    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)    Hypercholesteremia    Hypertension    Insomnia    Internal hemorrhoids    Muscle weakness    Neuropathy    On home O2    qhs   Osteoporosis    Rheumatoid arteritis ([place])    Ulcer    stomach   Vitamin D deficiency     Past Surgical History:  Procedure Laterality Date   ABDOMINAL HYSTERECTOMY     ABDOMINAL SURGERY     removed patial stomach from ulcer   COLONOSCOPY  04-2001   internal hemorrhoids, panocolonic diverticulosis, and colon polyps    UPPER GASTROINTESTINAL ENDOSCOPY      Social History   Socioeconomic History   Marital status: Widowed    Spouse name: Not on file   Number of children: 6   Years of  education: Not on file   Highest education level: Not on file  Occupational History   Occupation: Retired  Tobacco Use   Smoking status: Never   Smokeless tobacco: Never  Vaping Use   Vaping Use: Never used  Substance and Sexual Activity   Alcohol use: No   Drug use: No   Sexual activity: Not Currently  Other Topics Concern   Not on file  Social History Narrative   Long term resident of [facility]    Social Determinants of Health   Financial Resource Strain: Not on file  Food Insecurity: Not on file  Transportation Needs: Not on file  Physical Activity: Not on file  Stress: Not on file  Social Connections: Not on file  Intimate Partner Violence: Not on file   Family History  Problem Relation Age of Onset   Heart disease Father    Kidney disease Mother        kidney cancer   Colon cancer Daughter 78      VITAL SIGNS BP 140/72   Pulse 84   Temp (!) 97.5 F (36.4 C)   Resp 20   Ht  5\' 3"  (1.6 m)   Wt 201 lb 6.4 oz (91.4 kg)   SpO2 95%   BMI 35.68 kg/m   Outpatient Encounter Medications as of 01/18/2021  Medication Sig   acetaminophen (TYLENOL) 650 MG CR tablet Take 650 mg by mouth every 8 (eight) hours.   Amino Acids-Protein Hydrolys (PRO-STAT) LIQD Take by mouth. liquid; 15-100 gram-kcal/30 mL; oral Three Times A Day  Special Instructions: for albumin 2.8   apixaban (ELIQUIS) 2.5 MG TABS tablet Take 2.5 mg by mouth 2 (two) times daily.   denosumab (PROLIA) 60 MG/ML SOSY injection Inject 60 mg into the skin every 6 (six) months.   diltiazem (CARDIZEM CD) 300 MG 24 hr capsule Take 300 mg by mouth daily.   donepezil (ARICEPT) 10 MG tablet Take 10 mg by mouth at bedtime. For unspecified dementia without behavioral disturbance]   DULoxetine (CYMBALTA) 20 MG capsule Take 40 mg by mouth daily.    Fluticasone-Umeclidin-Vilant (TRELEGY ELLIPTA) 100-62.5-25 MCG/INH AEPB One puff inhalation once a day   folic acid (FOLVITE) 1 MG tablet Take 5 mg by mouth as directed. Once a  day on Sunday   guaiFENesin (MUCINEX) 600 MG 12 hr tablet Take 600 mg by mouth 2 (two) times daily.   methotrexate (RHEUMATREX) 2.5 MG tablet Take 15 mg by mouth once a week. On Sunday - Caution:Chemotherapy. Protect from light.   miconazole (ZEASORB-AF) 2 % powder Apply 1 application topically 2 (two) times daily as needed.   NON FORMULARY Diet Type:  NAS,  Consistent Carbohydrates, Dysphagia 3   ondansetron (ZOFRAN) 4 MG tablet Take 4 mg by mouth every 6 (six) hours as needed for nausea or vomiting.   OXYGEN Inhale 3.5 L/min into the lungs continuous.   pantoprazole (PROTONIX) 40 MG tablet Take 40 mg by mouth 2 (two) times daily.   phenylephrine-shark liver oil-mineral oil-petrolatum (PREPARATION H) 0.25-3-14-71.9 % rectal ointment Place 1 application rectally 2 (two) times daily as needed for hemorrhoids.   polyethylene glycol (MIRALAX / GLYCOLAX) packet Take 17 g by mouth daily as needed for moderate constipation.   potassium chloride SA (K-DUR,KLOR-CON) 20 MEQ tablet Take 40 mEq by mouth 3 (three) times daily.   pregabalin (LYRICA) 50 MG capsule Take 1 capsule by mouth  Twice daily   torsemide (DEMADEX) 20 MG tablet Take 80 mg by mouth 2 (two) times daily.   traMADol (ULTRAM) 50 MG tablet Take 0.5 tablets (25 mg total) by mouth at bedtime.   vitamin B-12 (CYANOCOBALAMIN) 1000 MCG tablet Take 1,000 mcg by mouth daily. 8 am for b12 level of 388   No facility-administered encounter medications on file as of 01/18/2021.     SIGNIFICANT DIAGNOSTIC EXAMS   PREVIOUS;    01-20-19: DEXA: t score -4.997  05-10-20: chest x-ray: interstitial edema peribronchial cuffing; cardiomegaly; pulmonary vascular redistribution  10-26-20: chest x-ray:  Mild bibasilar subsegmental atelectasis or edema is noted. Small right pleural effusion is noted. Aortic Atherosclerosis   NO NEW EXAMS.    LABS REVIEWED PREVIOUS :   01-13-20: glucose 224; bun 14; creat 1.21; k+ 4.3; na++ 137; ca 9.4 BNP  144.0 01-20-20: glucose 152; bun 14; creat 1.03 ;k+ 4.2; na++ 140 ca 9.9 03-02-20: glucose bun 20; creat 1.19 ;k+ 4.0; na++ 138; ca 9.6 05-12-20: wbc 4.6; hgb 9.9; hct 35.5; mcv 85.1 plt 102; glucose 185; bun 19; creat 1.19 ;k+ 3.7; na++ 142; ca 9.8; GFR44 vit B 12: 388; folate 20.3 06-06-20: wbc 11.1; hgb 10.7; hct 38.4 mcv 84.6 plt  92; glucose 237; bun 16; creat 1.32; k+ 3.4; na++ 137; ca 9.4 GFR38; urine culture multiple species 06-08-20: wbc 8.5; hgb 9.2; hct 32.8; mcv 84.8 plt 96; glucose 251; bun 17; creat 1.25; k+ 3.6; na++ 137; ca 9.4 GFR41 06-11-20: glucose 189; bun 16; creat 1.27; k+ 3.6; na++ 136; ca 9.4 GFR 40 06-18-20: hgb a1c 7.4  08-18-20: glucose 136; bun 19; creat 1.23; k+ 4.0; na++ 139; ca 8.6; GFR 42 09-03-20: wbc 4.9; hgb 8.1; hct 9.2; mcv 80,7 plt 98; glucose 136; bun 16; creat 1.18; k+ 3.5; na++ 140; ca 8.6; GFR 44; liver normal albumin 2.8  10-29-20: glucose 159; bun 21; creat 1.21; k+ 2.9; na++ 139; ca 9.0; GFR 43; liver normal albumin 3.4 10-30-20: k+ 3.1 11-23-20: k+ 3.1 11-26-20: glucose 178; bun 34; creat 1.34; k+ 3.7; na++ 138; ca 9.2 GFR 38   NO NEW LABS.    Review of Systems  Constitutional:  Negative for malaise/fatigue.  Respiratory:  Negative for cough and shortness of breath.   Cardiovascular:  Positive for leg swelling. Negative for chest pain and palpitations.  Gastrointestinal:  Negative for abdominal pain, constipation and heartburn.  Musculoskeletal:  Negative for back pain, joint pain and myalgias.  Skin: Negative.   Neurological:  Negative for dizziness.  Psychiatric/Behavioral:  The patient is not nervous/anxious.    Physical Exam Constitutional:      General: She is not in acute distress.    Appearance: She is well-developed. She is obese. She is not diaphoretic.  Neck:     Thyroid: No thyromegaly.  Cardiovascular:     Rate and Rhythm: Normal rate and regular rhythm.     Pulses: Normal pulses.     Heart sounds: Normal heart sounds.  Pulmonary:      Effort: Pulmonary effort is normal. No respiratory distress.     Breath sounds: Normal breath sounds.  Abdominal:     General: Bowel sounds are normal. There is no distension.     Palpations: Abdomen is soft.     Tenderness: There is no abdominal tenderness.  Musculoskeletal:     Cervical back: Neck supple.     Right lower leg: Edema present.     Left lower leg: Edema present.     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   Lymphadenopathy:     Cervical: No cervical adenopathy.  Skin:    General: Skin is warm and dry.  Neurological:     Mental Status: She is alert. Mental status is at baseline.  Psychiatric:        Mood and Affect: Mood normal.      ASSESSMENT/ PLAN:  TODAY  Chronic diastolic heart failure  Is worse: due to her GFR of 38 will use ace wraps to bilateral lower extremities and will monitor her status.    Ok Edwards NP [facility]  Contact [phone] Monday through Friday 8am- 5pm  After hours call [phone]

## 2021-01-19 ENCOUNTER — Encounter: Payer: Self-pay | Admitting: Internal Medicine

## 2021-01-19 ENCOUNTER — Non-Acute Institutional Stay (SKILLED_NURSING_FACILITY): Payer: Medicare Other | Admitting: Internal Medicine

## 2021-01-19 DIAGNOSIS — I10 Essential (primary) hypertension: Secondary | ICD-10-CM

## 2021-01-19 DIAGNOSIS — E440 Moderate protein-calorie malnutrition: Secondary | ICD-10-CM

## 2021-01-19 DIAGNOSIS — R600 Localized edema: Secondary | ICD-10-CM | POA: Diagnosis not present

## 2021-01-19 DIAGNOSIS — J9611 Chronic respiratory failure with hypoxia: Secondary | ICD-10-CM | POA: Diagnosis not present

## 2021-01-19 DIAGNOSIS — R609 Edema, unspecified: Secondary | ICD-10-CM | POA: Insufficient documentation

## 2021-01-19 NOTE — Assessment & Plan Note (Signed)
She exhibits coarse rhonchi in all lung fields.  She is on Trelegy 1 inhalation daily.  Trial of DuoNeb 3 times daily-4 times daily x7 to 10 days may be of benefit.  CXR and BNP will be repeated to assess for any vascular overload.

## 2021-01-19 NOTE — Progress Notes (Signed)
NURSING HOME LOCATION: [facility] ROOM NUMBER:  106 D  CODE STATUS:  DNR  PCP: Ok Edwards NP  This is a nursing facility follow up visit of chronic medical diagnoses & to document compliance with Regulation 483.30 (c) in The [place] Manual Phase 2 which mandates caregiver visit ( visits can alternate among physician, PA or NP as per statutes) within 10 days of 30 days / 60 days/ 90 days post admission to SNF date    Interim medical record and care since last SNF visit was updated with review of diagnostic studies and change in clinical status since last visit were documented.  HPI: She is a permanent resident of the facility with medical diagnoses of atrial fibrillation, chronic respiratory failure, dementia, history of diverticulosis, fatty liver, history of fibromyalgia, GERD, dyslipidemia, essential hypertension, rheumatoid arthritis, and diabetes with peripheral neuropathy. An acute issue is persistent peripheral edema with associated weeping.  She was seen 11/7 and Ace wraps ordered.  After discussion with staff and Wound Care Nurse Ace wraps were changed to Coban dressing. Current labs were reviewed.  A1c indicates excellent control with a value of 6.6% with glucose average of 143.  Anemia has been stable with an H/H of 8.1/30.1.  She has CKD stage IIIb with a creatinine of 1.34 and GFR of 38. In June she had significant deficiency in albumin and total protein with values of 2.8 and 5.9.  On 8/18 albumin had improved to 3.4 and total protein was normal at 6.9.  Chest x-ray in August revealed LVH and suggested vascular accentuation.  Review of systems: Dementia precluded completion of review of systems.  Also she is profoundly hard of hearing.  She had not eaten her meal and when I began to cut up the chicken breast; she said she did not want it .  She apparently had eaten 2 desserts .  She had a number of dolls on her bed and she identified one as "my  baby".  She began to gently caress the plastic hand of the doll as she said this.  Physical exam:  Pertinent or positive findings:She is morbidly obese.  As noted she is profoundly hard of hearing.  There is a light blue vascular nevus medial to the left eye.  She has complete dentures.  She has coarse rhonchi in all lung fields and nonproductive cough.  Heart sounds are obscured by the abnormal breath sounds.  Abdomen is massive.  Pedal pulses are decreased.  There is edema of the lower extremities below the knee.  She is wearing Ace wraps at this time.  She exhibits coarse tremor of both upper extremities.  General appearance: in no acute distress or increased work of breathing is present.   Lymphatic: No lymphadenopathy about the head, neck, axilla. Eyes: No conjunctival inflammation or lid edema is present. There is no scleral icterus. Ears:  External ear exam shows no significant lesions or deformities.   Nose:  External nasal examination shows no deformity or inflammation. Nasal mucosa are pink and moist without lesions, exudates Neck:  No thyromegaly, masses, tenderness noted.    Lungs: without wheezes, rales, rubs. Abdomen: Bowel sounds are normal. Abdomen is soft and nontender with no organomegaly, hernias, masses. GU: Deferred  Extremities:  No cyanosis, clubbing  Neurologic exam :Balance, Rhomberg, finger to nose testing could not be completed due to clinical state Skin: Warm & dry w/o tenting.  See summary under each active problem in the Problem  List with associated updated therapeutic plan

## 2021-01-19 NOTE — Assessment & Plan Note (Signed)
BP controlled; no change in antihypertensive medications  

## 2021-01-19 NOTE — Patient Instructions (Signed)
See assessment and plan under each diagnosis in the problem list and acutely for this visit 

## 2021-01-19 NOTE — Assessment & Plan Note (Addendum)
Ace wrap to be changed to Coban dressings.  The etiology of the longstanding edema may be multifactorial.  Her albumin and total protein have improved since June.  Chest x-ray in August did reveal LVH and possible vascular accentuation and effusions.  Repeating CXR &  BNP discussed with [facility] NP.

## 2021-01-19 NOTE — Assessment & Plan Note (Addendum)
Albumin & total protein values significantly improved from June  until August with albumin rising from 2.8-3.4 and total protein from 5.9 up to 6.9.  Nutritional status should be reevaluated as she may not be eating adequate protein based on my observations today. It is unknown whether this is a recurrent pattern.

## 2021-01-20 DIAGNOSIS — I517 Cardiomegaly: Secondary | ICD-10-CM | POA: Diagnosis not present

## 2021-01-20 DIAGNOSIS — J918 Pleural effusion in other conditions classified elsewhere: Secondary | ICD-10-CM | POA: Diagnosis not present

## 2021-01-20 DIAGNOSIS — I509 Heart failure, unspecified: Secondary | ICD-10-CM | POA: Diagnosis not present

## 2021-01-28 ENCOUNTER — Other Ambulatory Visit: Payer: Self-pay

## 2021-01-28 ENCOUNTER — Emergency Department (HOSPITAL_COMMUNITY): Payer: Medicare Other

## 2021-01-28 ENCOUNTER — Other Ambulatory Visit (HOSPITAL_COMMUNITY)
Admission: RE | Admit: 2021-01-28 | Discharge: 2021-01-28 | Disposition: A | Discharge status: Home / Self Care | Payer: Medicare Other | Source: Skilled Nursing Facility | Attending: Adult Health | Admitting: Adult Health

## 2021-01-28 ENCOUNTER — Ambulatory Visit (HOSPITAL_COMMUNITY): Admission: RE | Admit: 2021-01-28 | Payer: Medicare Other | Source: Skilled Nursing Facility | Admitting: Adult Health

## 2021-01-28 ENCOUNTER — Encounter: Payer: Self-pay | Admitting: Adult Health

## 2021-01-28 ENCOUNTER — Non-Acute Institutional Stay (SKILLED_NURSING_FACILITY): Payer: Medicare Other | Admitting: Adult Health

## 2021-01-28 ENCOUNTER — Inpatient Hospital Stay (HOSPITAL_COMMUNITY)
Admission: EM | Admit: 2021-01-28 | Discharge: 2021-02-03 | DRG: 808 | Disposition: A | Discharge status: Hospice | Payer: Medicare Other | Attending: Internal Medicine | Admitting: Internal Medicine

## 2021-01-28 ENCOUNTER — Encounter (HOSPITAL_COMMUNITY): Payer: Self-pay

## 2021-01-28 ENCOUNTER — Encounter (HOSPITAL_COMMUNITY): Payer: Self-pay | Admitting: Emergency Medicine

## 2021-01-28 DIAGNOSIS — F32A Depression, unspecified: Secondary | ICD-10-CM | POA: Diagnosis present

## 2021-01-28 DIAGNOSIS — R7989 Other specified abnormal findings of blood chemistry: Secondary | ICD-10-CM

## 2021-01-28 DIAGNOSIS — Z20822 Contact with and (suspected) exposure to covid-19: Secondary | ICD-10-CM | POA: Diagnosis present

## 2021-01-28 DIAGNOSIS — I509 Heart failure, unspecified: Secondary | ICD-10-CM

## 2021-01-28 DIAGNOSIS — D61818 Other pancytopenia: Secondary | ICD-10-CM | POA: Diagnosis present

## 2021-01-28 DIAGNOSIS — Z841 Family history of disorders of kidney and ureter: Secondary | ICD-10-CM

## 2021-01-28 DIAGNOSIS — R042 Hemoptysis: Secondary | ICD-10-CM | POA: Diagnosis present

## 2021-01-28 DIAGNOSIS — W19XXXD Unspecified fall, subsequent encounter: Secondary | ICD-10-CM | POA: Diagnosis not present

## 2021-01-28 DIAGNOSIS — Z79899 Other long term (current) drug therapy: Secondary | ICD-10-CM

## 2021-01-28 DIAGNOSIS — Z8249 Family history of ischemic heart disease and other diseases of the circulatory system: Secondary | ICD-10-CM

## 2021-01-28 DIAGNOSIS — R4182 Altered mental status, unspecified: Secondary | ICD-10-CM | POA: Diagnosis not present

## 2021-01-28 DIAGNOSIS — Y92129 Unspecified place in nursing home as the place of occurrence of the external cause: Secondary | ICD-10-CM | POA: Diagnosis not present

## 2021-01-28 DIAGNOSIS — E46 Unspecified protein-calorie malnutrition: Secondary | ICD-10-CM

## 2021-01-28 DIAGNOSIS — I482 Chronic atrial fibrillation, unspecified: Secondary | ICD-10-CM | POA: Diagnosis present

## 2021-01-28 DIAGNOSIS — I517 Cardiomegaly: Secondary | ICD-10-CM | POA: Diagnosis not present

## 2021-01-28 DIAGNOSIS — I5031 Acute diastolic (congestive) heart failure: Secondary | ICD-10-CM | POA: Diagnosis not present

## 2021-01-28 DIAGNOSIS — R0600 Dyspnea, unspecified: Secondary | ICD-10-CM

## 2021-01-28 DIAGNOSIS — J811 Chronic pulmonary edema: Secondary | ICD-10-CM | POA: Diagnosis not present

## 2021-01-28 DIAGNOSIS — N179 Acute kidney failure, unspecified: Secondary | ICD-10-CM | POA: Diagnosis present

## 2021-01-28 DIAGNOSIS — R279 Unspecified lack of coordination: Secondary | ICD-10-CM | POA: Diagnosis not present

## 2021-01-28 DIAGNOSIS — N189 Chronic kidney disease, unspecified: Secondary | ICD-10-CM | POA: Diagnosis not present

## 2021-01-28 DIAGNOSIS — G9341 Metabolic encephalopathy: Secondary | ICD-10-CM | POA: Diagnosis present

## 2021-01-28 DIAGNOSIS — F0394 Unspecified dementia, unspecified severity, with anxiety: Secondary | ICD-10-CM | POA: Diagnosis present

## 2021-01-28 DIAGNOSIS — Z043 Encounter for examination and observation following other accident: Secondary | ICD-10-CM | POA: Diagnosis not present

## 2021-01-28 DIAGNOSIS — J189 Pneumonia, unspecified organism: Secondary | ICD-10-CM | POA: Diagnosis present

## 2021-01-28 DIAGNOSIS — E7800 Pure hypercholesterolemia, unspecified: Secondary | ICD-10-CM | POA: Diagnosis present

## 2021-01-28 DIAGNOSIS — I130 Hypertensive heart and chronic kidney disease with heart failure and stage 1 through stage 4 chronic kidney disease, or unspecified chronic kidney disease: Secondary | ICD-10-CM | POA: Diagnosis present

## 2021-01-28 DIAGNOSIS — K922 Gastrointestinal hemorrhage, unspecified: Secondary | ICD-10-CM | POA: Diagnosis present

## 2021-01-28 DIAGNOSIS — R059 Cough, unspecified: Secondary | ICD-10-CM | POA: Diagnosis not present

## 2021-01-28 DIAGNOSIS — Z6836 Body mass index (BMI) 36.0-36.9, adult: Secondary | ICD-10-CM | POA: Diagnosis not present

## 2021-01-28 DIAGNOSIS — D649 Anemia, unspecified: Secondary | ICD-10-CM | POA: Diagnosis not present

## 2021-01-28 DIAGNOSIS — W19XXXA Unspecified fall, initial encounter: Secondary | ICD-10-CM | POA: Diagnosis present

## 2021-01-28 DIAGNOSIS — Z515 Encounter for palliative care: Secondary | ICD-10-CM | POA: Diagnosis not present

## 2021-01-28 DIAGNOSIS — M1812 Unilateral primary osteoarthritis of first carpometacarpal joint, left hand: Secondary | ICD-10-CM | POA: Diagnosis not present

## 2021-01-28 DIAGNOSIS — J440 Chronic obstructive pulmonary disease with acute lower respiratory infection: Secondary | ICD-10-CM | POA: Diagnosis present

## 2021-01-28 DIAGNOSIS — J441 Chronic obstructive pulmonary disease with (acute) exacerbation: Secondary | ICD-10-CM | POA: Diagnosis present

## 2021-01-28 DIAGNOSIS — M1990 Unspecified osteoarthritis, unspecified site: Secondary | ICD-10-CM | POA: Diagnosis present

## 2021-01-28 DIAGNOSIS — R58 Hemorrhage, not elsewhere classified: Secondary | ICD-10-CM | POA: Diagnosis present

## 2021-01-28 DIAGNOSIS — Z9981 Dependence on supplemental oxygen: Secondary | ICD-10-CM

## 2021-01-28 DIAGNOSIS — M069 Rheumatoid arthritis, unspecified: Secondary | ICD-10-CM | POA: Diagnosis present

## 2021-01-28 DIAGNOSIS — N184 Chronic kidney disease, stage 4 (severe): Secondary | ICD-10-CM | POA: Diagnosis present

## 2021-01-28 DIAGNOSIS — J90 Pleural effusion, not elsewhere classified: Secondary | ICD-10-CM | POA: Diagnosis not present

## 2021-01-28 DIAGNOSIS — Z8711 Personal history of peptic ulcer disease: Secondary | ICD-10-CM

## 2021-01-28 DIAGNOSIS — J9621 Acute and chronic respiratory failure with hypoxia: Secondary | ICD-10-CM | POA: Diagnosis present

## 2021-01-28 DIAGNOSIS — M797 Fibromyalgia: Secondary | ICD-10-CM | POA: Diagnosis present

## 2021-01-28 DIAGNOSIS — E8809 Other disorders of plasma-protein metabolism, not elsewhere classified: Secondary | ICD-10-CM | POA: Diagnosis not present

## 2021-01-28 DIAGNOSIS — K219 Gastro-esophageal reflux disease without esophagitis: Secondary | ICD-10-CM | POA: Diagnosis not present

## 2021-01-28 DIAGNOSIS — E875 Hyperkalemia: Secondary | ICD-10-CM | POA: Diagnosis not present

## 2021-01-28 DIAGNOSIS — L89152 Pressure ulcer of sacral region, stage 2: Secondary | ICD-10-CM | POA: Diagnosis present

## 2021-01-28 DIAGNOSIS — Z85828 Personal history of other malignant neoplasm of skin: Secondary | ICD-10-CM

## 2021-01-28 DIAGNOSIS — J449 Chronic obstructive pulmonary disease, unspecified: Secondary | ICD-10-CM

## 2021-01-28 DIAGNOSIS — N19 Unspecified kidney failure: Secondary | ICD-10-CM | POA: Diagnosis not present

## 2021-01-28 DIAGNOSIS — I5033 Acute on chronic diastolic (congestive) heart failure: Secondary | ICD-10-CM | POA: Diagnosis present

## 2021-01-28 DIAGNOSIS — R0902 Hypoxemia: Secondary | ICD-10-CM | POA: Diagnosis not present

## 2021-01-28 DIAGNOSIS — Z7189 Other specified counseling: Secondary | ICD-10-CM | POA: Diagnosis not present

## 2021-01-28 DIAGNOSIS — J9622 Acute and chronic respiratory failure with hypercapnia: Secondary | ICD-10-CM | POA: Diagnosis present

## 2021-01-28 DIAGNOSIS — E559 Vitamin D deficiency, unspecified: Secondary | ICD-10-CM | POA: Diagnosis present

## 2021-01-28 DIAGNOSIS — E440 Moderate protein-calorie malnutrition: Secondary | ICD-10-CM | POA: Diagnosis present

## 2021-01-28 DIAGNOSIS — Z66 Do not resuscitate: Secondary | ICD-10-CM | POA: Diagnosis present

## 2021-01-28 DIAGNOSIS — Z7401 Bed confinement status: Secondary | ICD-10-CM | POA: Diagnosis not present

## 2021-01-28 DIAGNOSIS — E1122 Type 2 diabetes mellitus with diabetic chronic kidney disease: Secondary | ICD-10-CM | POA: Diagnosis present

## 2021-01-28 DIAGNOSIS — J9611 Chronic respiratory failure with hypoxia: Secondary | ICD-10-CM | POA: Diagnosis not present

## 2021-01-28 DIAGNOSIS — H9190 Unspecified hearing loss, unspecified ear: Secondary | ICD-10-CM | POA: Diagnosis present

## 2021-01-28 DIAGNOSIS — L8990 Pressure ulcer of unspecified site, unspecified stage: Secondary | ICD-10-CM | POA: Insufficient documentation

## 2021-01-28 DIAGNOSIS — R296 Repeated falls: Secondary | ICD-10-CM | POA: Diagnosis present

## 2021-01-28 DIAGNOSIS — M810 Age-related osteoporosis without current pathological fracture: Secondary | ICD-10-CM | POA: Diagnosis present

## 2021-01-28 DIAGNOSIS — D696 Thrombocytopenia, unspecified: Secondary | ICD-10-CM | POA: Diagnosis not present

## 2021-01-28 DIAGNOSIS — G609 Hereditary and idiopathic neuropathy, unspecified: Secondary | ICD-10-CM | POA: Diagnosis present

## 2021-01-28 DIAGNOSIS — R195 Other fecal abnormalities: Secondary | ICD-10-CM | POA: Diagnosis present

## 2021-01-28 DIAGNOSIS — G4700 Insomnia, unspecified: Secondary | ICD-10-CM | POA: Diagnosis present

## 2021-01-28 DIAGNOSIS — R0689 Other abnormalities of breathing: Secondary | ICD-10-CM

## 2021-01-28 DIAGNOSIS — Z7901 Long term (current) use of anticoagulants: Secondary | ICD-10-CM

## 2021-01-28 DIAGNOSIS — F411 Generalized anxiety disorder: Secondary | ICD-10-CM | POA: Diagnosis present

## 2021-01-28 DIAGNOSIS — Z91048 Other nonmedicinal substance allergy status: Secondary | ICD-10-CM

## 2021-01-28 DIAGNOSIS — Z888 Allergy status to other drugs, medicaments and biological substances status: Secondary | ICD-10-CM

## 2021-01-28 DIAGNOSIS — I110 Hypertensive heart disease with heart failure: Secondary | ICD-10-CM | POA: Diagnosis not present

## 2021-01-28 DIAGNOSIS — Z993 Dependence on wheelchair: Secondary | ICD-10-CM

## 2021-01-28 LAB — RESP PANEL BY RT-PCR (FLU A&B, COVID) ARPGX2
Influenza A by PCR: NEGATIVE
Influenza B by PCR: NEGATIVE
SARS Coronavirus 2 by RT PCR: NEGATIVE

## 2021-01-28 LAB — PREPARE RBC (CROSSMATCH)

## 2021-01-28 LAB — IRON AND TIBC
Iron: 245 ug/dL — ABNORMAL HIGH (ref 28–170)
Saturation Ratios: 88 % — ABNORMAL HIGH (ref 10.4–31.8)
TIBC: 278 ug/dL (ref 250–450)
UIBC: 33 ug/dL

## 2021-01-28 LAB — CBC WITH DIFFERENTIAL/PLATELET
Basophils Absolute: 0 10*3/uL (ref 0.0–0.1)
Basophils Relative: 1 %
Eosinophils Absolute: 0.1 10*3/uL (ref 0.0–0.5)
Eosinophils Relative: 2 %
HCT: 20.8 % — ABNORMAL LOW (ref 36.0–46.0)
Hemoglobin: 5.7 g/dL — CL (ref 12.0–15.0)
Lymphocytes Relative: 10 %
Lymphs Abs: 0.5 10*3/uL — ABNORMAL LOW (ref 0.7–4.0)
MCH: 25.1 pg — ABNORMAL LOW (ref 26.0–34.0)
MCHC: 27.4 g/dL — ABNORMAL LOW (ref 30.0–36.0)
MCV: 91.6 fL (ref 80.0–100.0)
Monocytes Absolute: 0 10*3/uL — ABNORMAL LOW (ref 0.1–1.0)
Monocytes Relative: 0 %
Neutro Abs: 3.9 10*3/uL (ref 1.7–7.7)
Neutrophils Relative %: 87 %
Platelets: 55 10*3/uL — ABNORMAL LOW (ref 150–400)
RBC: 2.27 MIL/uL — ABNORMAL LOW (ref 3.87–5.11)
RDW: 27.1 % — ABNORMAL HIGH (ref 11.5–15.5)
WBC: 4.5 10*3/uL (ref 4.0–10.5)
nRBC: 0 % (ref 0.0–0.2)

## 2021-01-28 LAB — AMMONIA: Ammonia: 17 umol/L (ref 9–35)

## 2021-01-28 LAB — HEPATIC FUNCTION PANEL
ALT: 12 U/L (ref 0–44)
AST: 14 U/L — ABNORMAL LOW (ref 15–41)
Albumin: 3 g/dL — ABNORMAL LOW (ref 3.5–5.0)
Alkaline Phosphatase: 59 U/L (ref 38–126)
Bilirubin, Direct: 0.4 mg/dL — ABNORMAL HIGH (ref 0.0–0.2)
Indirect Bilirubin: 0.9 mg/dL (ref 0.3–0.9)
Total Bilirubin: 1.3 mg/dL — ABNORMAL HIGH (ref 0.3–1.2)
Total Protein: 5.9 g/dL — ABNORMAL LOW (ref 6.5–8.1)

## 2021-01-28 LAB — BASIC METABOLIC PANEL
Anion gap: 6 (ref 5–15)
BUN: 58 mg/dL — ABNORMAL HIGH (ref 8–23)
CO2: 29 mmol/L (ref 22–32)
Calcium: 9.8 mg/dL (ref 8.9–10.3)
Chloride: 105 mmol/L (ref 98–111)
Creatinine, Ser: 2.06 mg/dL — ABNORMAL HIGH (ref 0.44–1.00)
GFR, Estimated: 22 mL/min — ABNORMAL LOW (ref >60)
Glucose, Bld: 134 mg/dL — ABNORMAL HIGH (ref 70–99)
Potassium: 5.7 mmol/L — ABNORMAL HIGH (ref 3.5–5.1)
Sodium: 140 mmol/L (ref 135–145)

## 2021-01-28 LAB — POC OCCULT BLOOD, ED: Fecal Occult Bld: POSITIVE — AB

## 2021-01-28 LAB — RETICULOCYTES
Immature Retic Fract: 6 % (ref 2.3–15.9)
RBC.: 2.21 MIL/uL — ABNORMAL LOW (ref 3.87–5.11)
Retic Ct Pct: <0.4 % — ABNORMAL LOW (ref 0.4–3.1)

## 2021-01-28 LAB — BLOOD GAS, ARTERIAL
Acid-Base Excess: 5.6 mmol/L — ABNORMAL HIGH (ref 0.0–2.0)
Bicarbonate: 29.1 mmol/L — ABNORMAL HIGH (ref 20.0–28.0)
Drawn by: 27733
FIO2: 28
O2 Saturation: 58 %
Patient temperature: 36.7
pCO2 arterial: 56.5 mmHg — ABNORMAL HIGH (ref 32.0–48.0)
pH, Arterial: 7.356 (ref 7.350–7.450)
pO2, Arterial: 33.4 mmHg — CL (ref 83.0–108.0)

## 2021-01-28 LAB — EXPECTORATED SPUTUM ASSESSMENT W GRAM STAIN, RFLX TO RESP C

## 2021-01-28 LAB — FERRITIN: Ferritin: 104 ng/mL (ref 11–307)

## 2021-01-28 LAB — URINALYSIS, ROUTINE W REFLEX MICROSCOPIC
Bilirubin Urine: NEGATIVE
Glucose, UA: NEGATIVE mg/dL
Ketones, ur: NEGATIVE mg/dL
Nitrite: NEGATIVE
Protein, ur: NEGATIVE mg/dL
Specific Gravity, Urine: 1.008 (ref 1.005–1.030)
WBC, UA: >50 WBC/hpf — ABNORMAL HIGH (ref 0–5)
pH: 5 (ref 5.0–8.0)

## 2021-01-28 LAB — FOLATE: Folate: 22.2 ng/mL (ref >5.9)

## 2021-01-28 LAB — BRAIN NATRIURETIC PEPTIDE: B Natriuretic Peptide: 416 pg/mL — ABNORMAL HIGH (ref 0.0–100.0)

## 2021-01-28 LAB — VITAMIN B12: Vitamin B-12: 2703 pg/mL — ABNORMAL HIGH (ref 180–914)

## 2021-01-28 MED ORDER — FUROSEMIDE 10 MG/ML IJ SOLN
40.0000 mg | Freq: Once | INTRAMUSCULAR | Status: AC
  Administered 2021-01-28: 19:00:00 40 mg via INTRAVENOUS
  Filled 2021-01-28: qty 4

## 2021-01-28 MED ORDER — SODIUM CHLORIDE 0.9 % IV SOLN
10.0000 mL/h | Freq: Once | INTRAVENOUS | Status: AC
  Administered 2021-01-28: 17:00:00 10 mL/h via INTRAVENOUS

## 2021-01-28 MED ORDER — FUROSEMIDE 10 MG/ML IJ SOLN
20.0000 mg | Freq: Two times a day (BID) | INTRAMUSCULAR | Status: DC
  Administered 2021-01-29 – 2021-01-31 (×6): 20 mg via INTRAVENOUS
  Filled 2021-01-28 (×7): qty 2

## 2021-01-28 MED ORDER — IPRATROPIUM-ALBUTEROL 0.5-2.5 (3) MG/3ML IN SOLN
3.0000 mL | Freq: Once | RESPIRATORY_TRACT | Status: AC
  Administered 2021-01-28: 17:00:00 3 mL via RESPIRATORY_TRACT
  Filled 2021-01-28: qty 3

## 2021-01-28 NOTE — Progress Notes (Addendum)
Location:  [place] Room Number: 106 Place of Service:  SNF (31)   CODE STATUS: dnr   Allergies  Allergen Reactions   Other     Band Aids    Tape    Lipitor [Atorvastatin Calcium] Rash and Other (See Comments)    Muscle pain   Niaspan [Niacin Er] Rash and Other (See Comments)    Muscle pain    Chief Complaint  Patient presents with   Acute Visit    Status post fall     HPI:  She had a fall at her bedside where her head was hit. She has had cough for the past several days. After her fall she was noted to be coughing up blood in her sputum. She was to be sent to the ED; however; that was not able to happen. This AM she is less responsive to verbal stimuli. She has congestion present. Her respirations are labored. She is pale. There are no reports of fevers present.   Past Medical History:  Diagnosis Date   Anemia    Atrial fibrillation (HCC)    Back pain    Bacterial pneumonia    Cancer ([place])    Skin    Chronic respiratory failure ([place])    Cognitive communication deficit    COPD (chronic obstructive pulmonary disease) (HCC)    Dementia ([place])    Depression    Diabetes mellitus without complication ([place])    Diverticulosis    Fatty liver    Fibromyalgia    Gastric polyp 09/08/11   at the anastomosis-inflammatory   Generalized anxiety disorder    GERD (gastroesophageal reflux disease)    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)    Hypercholesteremia    Hypertension    Insomnia    Internal hemorrhoids    Muscle weakness    Neuropathy    On home O2    qhs   Osteoporosis    Rheumatoid arteritis ([place])    Ulcer    stomach   Vitamin D deficiency     Past Surgical History:  Procedure Laterality Date   ABDOMINAL HYSTERECTOMY     ABDOMINAL SURGERY     removed patial stomach from ulcer   COLONOSCOPY  04-2001   internal hemorrhoids, panocolonic diverticulosis, and colon polyps    UPPER GASTROINTESTINAL ENDOSCOPY      Social History    Socioeconomic History   Marital status: Widowed    Spouse name: Not on file   Number of children: 6   Years of education: Not on file   Highest education level: Not on file  Occupational History   Occupation: Retired  Tobacco Use   Smoking status: Never   Smokeless tobacco: Never  Vaping Use   Vaping Use: Never used  Substance and Sexual Activity   Alcohol use: No   Drug use: No   Sexual activity: Not Currently  Other Topics Concern   Not on file  Social History Narrative   Long term resident of [facility]    Social Determinants of Health   Financial Resource Strain: Not on file  Food Insecurity: Not on file  Transportation Needs: Not on file  Physical Activity: Not on file  Stress: Not on file  Social Connections: Not on file  Intimate Partner Violence: Not on file   Family History  Problem Relation Age of Onset   Heart disease Father    Kidney disease Mother        kidney cancer   Colon  cancer Daughter 5      VITAL SIGNS BP 120/66   Pulse 70   Temp 98 F (36.7 C)   Resp 20   Ht 5\' 3"  (1.6 m)   Wt 201 lb 6.4 oz (91.4 kg)   SpO2 94%   BMI 35.68 kg/m   Outpatient Encounter Medications as of 01/28/2021  Medication Sig   acetaminophen (TYLENOL) 650 MG CR tablet Take 650 mg by mouth every 8 (eight) hours.   Amino Acids-Protein Hydrolys (PRO-STAT) LIQD Take by mouth. liquid; 15-100 gram-kcal/30 mL; oral Three Times A Day  Special Instructions: for albumin 2.8   apixaban (ELIQUIS) 2.5 MG TABS tablet Take 2.5 mg by mouth 2 (two) times daily.   denosumab (PROLIA) 60 MG/ML SOSY injection Inject 60 mg into the skin every 6 (six) months.   diltiazem (CARDIZEM CD) 300 MG 24 hr capsule Take 300 mg by mouth daily.   donepezil (ARICEPT) 10 MG tablet Take 10 mg by mouth at bedtime. For unspecified dementia without behavioral disturbance]   DULoxetine (CYMBALTA) 20 MG capsule Take 40 mg by mouth daily.    Fluticasone-Umeclidin-Vilant (TRELEGY ELLIPTA) 100-62.5-25 MCG/INH  AEPB One puff inhalation once a day   folic acid (FOLVITE) 1 MG tablet Take 5 mg by mouth as directed. Once a day on Sunday   guaiFENesin (MUCINEX) 600 MG 12 hr tablet Take 600 mg by mouth 2 (two) times daily.   methotrexate (RHEUMATREX) 2.5 MG tablet Take 15 mg by mouth once a week. On Sunday - Caution:Chemotherapy. Protect from light.   miconazole (ZEASORB-AF) 2 % powder Apply 1 application topically 2 (two) times daily as needed.   NON FORMULARY Diet Type:  NAS,  Consistent Carbohydrates, Dysphagia 3   ondansetron (ZOFRAN) 4 MG tablet Take 4 mg by mouth every 6 (six) hours as needed for nausea or vomiting.   OXYGEN Inhale 3.5 L/min into the lungs continuous.   pantoprazole (PROTONIX) 40 MG tablet Take 40 mg by mouth 2 (two) times daily.   phenylephrine-shark liver oil-mineral oil-petrolatum (PREPARATION H) 0.25-3-14-71.9 % rectal ointment Place 1 application rectally 2 (two) times daily as needed for hemorrhoids.   polyethylene glycol (MIRALAX / GLYCOLAX) packet Take 17 g by mouth daily as needed for moderate constipation.   potassium chloride SA (K-DUR,KLOR-CON) 20 MEQ tablet Take 40 mEq by mouth 3 (three) times daily.   pregabalin (LYRICA) 50 MG capsule Take 1 capsule by mouth  Twice daily   torsemide (DEMADEX) 20 MG tablet Take 80 mg by mouth 2 (two) times daily.   traMADol (ULTRAM) 50 MG tablet Take 0.5 tablets (25 mg total) by mouth at bedtime.   vitamin B-12 (CYANOCOBALAMIN) 1000 MCG tablet Take 1,000 mcg by mouth daily. 8 am for b12 level of 388   No facility-administered encounter medications on file as of 01/28/2021.     SIGNIFICANT DIAGNOSTIC EXAMS  PREVIOUS;    01-20-19: DEXA: t score -4.997  05-10-20: chest x-ray: interstitial edema peribronchial cuffing; cardiomegaly; pulmonary vascular redistribution  10-26-20: chest x-ray:  Mild bibasilar subsegmental atelectasis or edema is noted. Small right pleural effusion is noted. Aortic Atherosclerosis   NO NEW EXAMS.    LABS  REVIEWED PREVIOUS :   01-13-20: glucose 224; bun 14; creat 1.21; k+ 4.3; na++ 137; ca 9.4 BNP 144.0 01-20-20: glucose 152; bun 14; creat 1.03 ;k+ 4.2; na++ 140 ca 9.9 03-02-20: glucose bun 20; creat 1.19 ;k+ 4.0; na++ 138; ca 9.6 05-12-20: wbc 4.6; hgb 9.9; hct 35.5; mcv 85.1 plt 102;  glucose 185; bun 19; creat 1.19 ;k+ 3.7; na++ 142; ca 9.8; GFR44 vit B 12: 388; folate 20.3 06-06-20: wbc 11.1; hgb 10.7; hct 38.4 mcv 84.6 plt 92; glucose 237; bun 16; creat 1.32; k+ 3.4; na++ 137; ca 9.4 GFR38; urine culture multiple species 06-08-20: wbc 8.5; hgb 9.2; hct 32.8; mcv 84.8 plt 96; glucose 251; bun 17; creat 1.25; k+ 3.6; na++ 137; ca 9.4 GFR41 06-11-20: glucose 189; bun 16; creat 1.27; k+ 3.6; na++ 136; ca 9.4 GFR 40 06-18-20: hgb a1c 7.4  08-18-20: glucose 136; bun 19; creat 1.23; k+ 4.0; na++ 139; ca 8.6; GFR 42 09-03-20: wbc 4.9; hgb 8.1; hct 9.2; mcv 80,7 plt 98; glucose 136; bun 16; creat 1.18; k+ 3.5; na++ 140; ca 8.6; GFR 44; liver normal albumin 2.8  10-29-20: glucose 159; bun 21; creat 1.21; k+ 2.9; na++ 139; ca 9.0; GFR 43; liver normal albumin 3.4 10-30-20: k+ 3.1 11-23-20: k+ 3.1 11-26-20: glucose 178; bun 34; creat 1.34; k+ 3.7; na++ 138; ca 9.2 GFR 38   NO NEW LABS.   Review of Systems  Unable to perform ROS: Medical condition   Physical Exam Constitutional:      General: She is not in acute distress.    Appearance: She is well-developed. She is obese. She is not diaphoretic.     Comments: Is aware; is lethargic   Eyes:     Comments: Pupils are reactive to light   Neck:     Thyroid: No thyromegaly.  Cardiovascular:     Rate and Rhythm: Normal rate and regular rhythm.     Heart sounds: Normal heart sounds.  Pulmonary:     Effort: No respiratory distress.     Breath sounds: Wheezing, rhonchi and rales present.     Comments: 02; increased effort  Abdominal:     General: Bowel sounds are normal. There is no distension.     Palpations: Abdomen is soft.     Tenderness: There is no  abdominal tenderness.  Musculoskeletal:     Cervical back: Neck supple.     Right lower leg: Edema present.     Left lower leg: Edema present.  Lymphadenopathy:     Cervical: No cervical adenopathy.  Skin:    General: Skin is warm and dry.      ASSESSMENT/ PLAN:  TODAY  Fall at nursing home initial encounter Chronic obstructive pulmonary disease unspecified COPD type Chronic respiratory failure with hypoxia  Will get cxr; sputum culture; cbc; bmp Ct of head without contrast duoNeb every 6 hours for one week Robitussion 20 cc every 6 hours for one week Rocephin 1 gm IM daily for 10 days.     ADDENDUM:   Is in acute renal failure: will begin NS 75 cc per hour for 3 liters will repeat BMP   ADDENDUM#2  Will send to the ED for hgb 5.7   Ok Edwards NP [facility]  Contact [phone] Monday through Friday 8am- 5pm  After hours call [phone]

## 2021-01-28 NOTE — Addendum Note (Signed)
Addended by: Gerlene Fee on: 01/28/2021 03:28 PM   Modules accepted: Level of Service

## 2021-01-28 NOTE — H&P (Addendum)
History and Physical  [name]:[id number] DOB: 03/15/1928 DOA: 01/28/2021  Referring physician: Sherwood Gambler, MD PCP: Gerlene Fee, NP  Patient coming from: [facility]  Chief Complaint: Altered mental status  HPI: [name] is a 87 y.o. female with medical history significant for chronic respiratory failure on supplemental oxygen at 2.5 LPM, CKD stage III, rheumatoid arthritis, A. fib on Eliquis, GERD, COPD who presents to the emergency department via EMS due to altered mental status.  Patient was unable to provide history due to altered mental status, history was obtained from ED physician, ED medical record and daughter at bedside.  Per report, patient was reported to sustain a fall yesterday (11/16) when she hit her head, labs were obtained at her facility and patient's creatinine was noted to be at 2 and hemoglobin at 5.7.  She was reported to have had several days of cough and congestion.  Today, she was noted to be less responsive and lethargic (patient was usually able to maintain reasonable conversation at baseline).  Patient ambulates with a wheelchair and able to self transfer to bed with and without assistance at baseline.  ED Course:  In the emergency department, she was intermittently tachypneic and was hypoxic with O2 sat in the 70s and 80s, this improved with supplemental oxygen via [place].  Work-up in the ED showed H/H of 5.7/20.8 (H/H on 09/03/2020 was 8.1/30.1).  Platelets 55, K+ 5.7, ABG: pH 7.356, PCO2 56.5, PO2 33.4, bicarb 29.1 at FiO2 of 28.1, FOBT positive.  BNP 416, albumin 3.0, urinalysis was unimpressive for UTI.  Influenza A, B, SARS coronavirus 2 was negative. CT of head without contrast showed no acute intracranial abnormality Chest x-ray showed cardiomegaly with pulmonary edema and small pleural effusions consistent with CHF and patchy bibasilar opacities, atelectasis versus pneumonia. Left hand x-ray showed no acute fracture or dislocation of  the left hand but showed osteoarthritis and osteopenia IV Lasix 40 mg was given, breathing treatment was provided.  1 unit of PRBC was given.  Hospitalist was asked to admit patient for further evaluation and management.  Hospitalist was asked to admit patient for further evaluation and management.   Review of Systems: This cannot be obtained at this time due to patient's current condition  Past Medical History:  Diagnosis Date   Anemia    Atrial fibrillation ([place])    Back pain    Bacterial pneumonia    Cancer ([place])    Skin    Chronic respiratory failure ([place])    Cognitive communication deficit    COPD (chronic obstructive pulmonary disease) (HCC)    Dementia ([place])    Depression    Diabetes mellitus without complication ([place])    Diverticulosis    Fatty liver    Fibromyalgia    Gastric polyp 09/08/11   at the anastomosis-inflammatory   Generalized anxiety disorder    GERD (gastroesophageal reflux disease)    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)    Hypercholesteremia    Hypertension    Insomnia    Internal hemorrhoids    Muscle weakness    Neuropathy    On home O2    qhs   Osteoporosis    Rheumatoid arteritis ([place])    Ulcer    stomach   Vitamin D deficiency    Past Surgical History:  Procedure Laterality Date   ABDOMINAL HYSTERECTOMY     ABDOMINAL SURGERY     removed patial stomach from ulcer   COLONOSCOPY  04-2001   internal hemorrhoids,  panocolonic diverticulosis, and colon polyps    UPPER GASTROINTESTINAL ENDOSCOPY      Social History:  reports that she has never smoked. She has never used smokeless tobacco. She reports that she does not drink alcohol and does not use drugs.   Allergies  Allergen Reactions   Other     Band Aids    Tape    Lipitor [Atorvastatin Calcium] Rash and Other (See Comments)    Muscle pain   Niaspan [Niacin Er] Rash and Other (See Comments)    Muscle pain    Family History  Problem Relation Age of Onset   Heart disease  Father    Kidney disease Mother        kidney cancer   Colon cancer Daughter 26     Prior to Admission medications   Medication Sig Start Date End Date Taking? Authorizing Provider  acetaminophen (TYLENOL) 650 MG CR tablet Take 650 mg by mouth every 8 (eight) hours.    [provider]  Amino Acids-Protein Hydrolys (PRO-STAT) LIQD Take by mouth. liquid; 15-100 gram-kcal/30 mL; oral Three Times A Day  Special Instructions: for albumin 2.8    [provider]  apixaban (ELIQUIS) 2.5 MG TABS tablet Take 2.5 mg by mouth 2 (two) times daily.    [provider]  denosumab (PROLIA) 60 MG/ML SOSY injection Inject 60 mg into the skin every 6 (six) months.    [provider]  diltiazem (CARDIZEM CD) 300 MG 24 hr capsule Take 300 mg by mouth daily. 02/05/19   [provider]  donepezil (ARICEPT) 10 MG tablet Take 10 mg by mouth at bedtime. For unspecified dementia without behavioral disturbance]    [provider]  DULoxetine (CYMBALTA) 20 MG capsule Take 40 mg by mouth daily.  10/18/19   [provider]  Fluticasone-Umeclidin-Vilant (TRELEGY ELLIPTA) 100-62.5-25 MCG/INH AEPB One puff inhalation once a day    [provider]  folic acid (FOLVITE) 1 MG tablet Take 5 mg by mouth as directed. Once a day on Sunday    [provider]  guaiFENesin (MUCINEX) 600 MG 12 hr tablet Take 600 mg by mouth 2 (two) times daily.    [provider]  methotrexate (RHEUMATREX) 2.5 MG tablet Take 15 mg by mouth once a week. On Sunday - Caution:Chemotherapy. Protect from light.    [provider]  miconazole (ZEASORB-AF) 2 % powder Apply 1 application topically 2 (two) times daily as needed.    [provider]  NON FORMULARY Diet Type:  NAS,  Consistent Carbohydrates, Dysphagia 3 05/06/17   [provider]  ondansetron (ZOFRAN) 4 MG tablet Take 4 mg by mouth every 6 (six) hours as needed for nausea or vomiting.     [provider]  OXYGEN Inhale 3.5 L/min into the lungs continuous. 05/06/17   [provider]  pantoprazole (PROTONIX) 40 MG tablet Take 40 mg by mouth 2 (two) times daily. 02/05/19   [provider]  phenylephrine-shark liver oil-mineral oil-petrolatum (PREPARATION H) 0.25-3-14-71.9 % rectal ointment Place 1 application rectally 2 (two) times daily as needed for hemorrhoids.    [provider]  polyethylene glycol (MIRALAX / GLYCOLAX) packet Take 17 g by mouth daily as needed for moderate constipation. 04/12/17   Johnson, Clanford L, MD  potassium chloride SA (K-DUR,KLOR-CON) 20 MEQ tablet Take 40 mEq by mouth 3 (three) times daily. 02/05/19   [provider]  pregabalin (LYRICA) 50 MG capsule Take 1 capsule by mouth  Twice daily 01/12/21   Gerlene Fee, NP  torsemide (DEMADEX) 20 MG tablet Take 80 mg by mouth 2 (two) times daily.    [provider]  traMADol (ULTRAM) 50 MG tablet Take 0.5 tablets (25 mg total) by mouth at bedtime. 01/12/21   Gerlene Fee, NP  vitamin B-12 (CYANOCOBALAMIN) 1000 MCG tablet Take 1,000 mcg by mouth daily. 8 am for b12 level of 388    [provider]    Physical Exam: BP (!) 127/54   Pulse 82   Temp 98.4 F (36.9 C) (Oral)   Resp 17   SpO2 100%   General: 87 y.o. year-old obese female lethargic, but easily arousable and in no acute distress.   HEENT: Notable dried blood to the lips without active bleeding.  NCAT, EOMI Neck: Supple, trachea medial Cardiovascular: Irregular rate and rhythm with no rubs or gallops.  No thyromegaly or JVD noted.  No lower extremity edema. 2/4 pulses in all 4 extremities. Respiratory: Scattered wheezing and bilateral rales in lower lobes on auscultation  Abdomen: Soft, nontender nondistended with normal bowel sounds x4 quadrants. Muskuloskeletal: Left index finger bruised (from sustained fall yesterday).  Bilateral lower extremity edema Neuro: Lethargic, but  easily arousable.  Spontaneously wakes up and move all  4 extremities.  sensation intact Endocrine: No thyromegaly Skin: No ulcerative lesions noted or rashes Psychiatry: Mood is appropriate for condition and setting          Labs on Admission:  Basic Metabolic Panel: Recent Labs  Lab 01/28/21 1424  NA 140  K 5.7*  CL 105  CO2 29  GLUCOSE 134*  BUN 58*  CREATININE 2.06*  CALCIUM 9.8   Liver Function Tests: Recent Labs  Lab 01/28/21 1634  AST 14*  ALT 12  ALKPHOS 59  BILITOT 1.3*  PROT 5.9*  ALBUMIN 3.0*   No results for input(s): LIPASE, AMYLASE in the last 168 hours. Recent Labs  Lab 01/28/21 1633  AMMONIA 17   CBC: Recent Labs  Lab 01/28/21 1424  WBC 4.5  NEUTROABS 3.9  HGB 5.7*  HCT 20.8*  MCV 91.6  PLT 55*   Cardiac Enzymes: No results for input(s): CKTOTAL, CKMB, CKMBINDEX, TROPONINI in the last 168 hours.  BNP (last 3 results) Recent Labs    01/28/21 1633  BNP 416.0*    ProBNP (last 3 results) No results for input(s): PROBNP in the last 8760 hours.  CBG: No results for input(s): GLUCAP in the last 168 hours.  Radiological Exams on Admission: CT Head Wo Contrast  Result Date: 01/28/2021 CLINICAL DATA:  Head trauma, minor (Age >= 65y). Altered mental status. Fall. EXAM: CT HEAD WITHOUT CONTRAST TECHNIQUE: Contiguous axial images were obtained from the base of the skull through the vertex without intravenous contrast. COMPARISON:  04/10/2017 FINDINGS: Brain: There is atrophy and chronic small vessel disease changes. No acute intracranial abnormality. Specifically, no hemorrhage, hydrocephalus, mass lesion, acute infarction, or significant intracranial injury. Vascular: No hyperdense vessel or unexpected calcification. Skull: No acute calvarial abnormality. Sinuses/Orbits: No acute findings Other: None IMPRESSION: Atrophy, chronic microvascular disease. No acute intracranial abnormality. Electronically Signed   By: Rolm Baptise M.D.   On:  01/28/2021 18:21   DG Chest Portable 1 View  Result Date: 01/28/2021 CLINICAL DATA:  Cough and congestion.  Acute renal failure. EXAM: PORTABLE CHEST 1 VIEW COMPARISON:  10/26/2020 FINDINGS: Chronic cardiomegaly. Unchanged mediastinal contours. Aortic atherosclerosis. Diffuse interstitial and bronchial thickening suspicious for pulmonary edema, worsened in the interim. Suspected  small pleural effusions. Additional patchy bibasilar opacities. No pneumothorax. Degenerative change of both shoulders. No acute osseous abnormalities are seen. The bones are diffusely under mineralized. IMPRESSION: 1. Cardiomegaly with pulmonary edema and small pleural effusions consistent with CHF. 2. Patchy bibasilar opacities, atelectasis versus pneumonia. Electronically Signed   By: Keith Rake M.D.   On: 01/28/2021 18:15   DG Hand Complete Left  Result Date: 01/28/2021 CLINICAL DATA:  Fall today. EXAM: LEFT HAND - COMPLETE 3+ VIEW COMPARISON:  None. FINDINGS: No acute fracture or dislocation. The bones are diffusely under mineralized. Osteoarthritis of the digits primarily affecting the distal interphalangeal joints. Mild osteoarthritis of the thumb at the carpal metacarpal joint. No erosive change. No focal soft tissue abnormalities. IMPRESSION: 1. No acute fracture or dislocation of the left hand. 2. Osteoarthritis and osteopenia. Electronically Signed   By: Keith Rake M.D.   On: 01/28/2021 18:14    EKG: I independently viewed the EKG done and my findings are as followed: A. fib with rate control  Assessment/Plan Present on Admission:  Fall at nursing home  Thrombocytopenia ([place])  COPD (chronic obstructive pulmonary disease) (HCC)  GERD (gastroesophageal reflux disease)  Principal Problem:   CHF (congestive heart failure) ([place]) Active Problems:   COPD (chronic obstructive pulmonary disease) (HCC)   GERD (gastroesophageal reflux disease)   Acute kidney injury superimposed on CKD (HCC)   Acute on  chronic respiratory failure with hypoxia and hypercapnia (HCC)   Atrial fibrillation, chronic (HCC)   Thrombocytopenia (HCC)   Fall at nursing home   GI bleed   Acute metabolic encephalopathy   Hyperkalemia   Hypoalbuminemia due to protein-calorie malnutrition (HCC)   Elevated brain natriuretic peptide (BNP) level  Acute metabolic encephalopathy secondary to acute on chronic respiratory failure with hypoxia and hypercapnia possibly secondary to CHF with superimposed mild COPD exacerbation Elevated BNP- 416 Chest x-ray showed cardiomegaly with pulmonary edema and small pleural effusions consistent with CHF  Continue total input/output, daily weights and fluid restriction Continue IV Lasix 20 milligrams twice daily (as permitted by SBP) Continue Cardiac diet  EKG showed A. fib with rate control  Echocardiogram will be done in the morning  Continue duo nebs, Mucinex. Continue incentive spirometry and flutter valve Continue supplemental oxygen to maintain O2 sat > 92% with plan to wean patient off oxygen as tolerated Cardiology will be consulted in the morning and we shall await further recommendation  Questionable atelectasis vs CAP POA Chest x-ray showed patchy bibasilar opacities, atelectasis versus pneumonia. Patient had no leukocytosis or fever, though she did have some cough Continue incentive spirometry and flutter valve Procalcitonin will be checked prior to starting patient on IV antibiotics  Acute kidney injury on CKD stage IV BUN/creatinine at 58/2.06 (baseline creatinine at 1.2-1.3) Renally adjust medications, avoid nephrotoxic agents/dehydration/hypotension  Fall at nursing home Continue fall precaution and neurochecks Continue PT/OT eval and treat  GI bleed H/H of 5.7/20.8 (H/H on 09/03/2020 was 8.1/30.1).  This may be due to chronic blood loss FOBT was positive 1 unit of PRBC was given Gastroenterology will be consulted and we shall await further  recommendation  Hyperkalemia K+ 5.7.  Lasix was given Milly Jakob will be given Continue to monitor potassium levels  Hypoalbuminemia secondary to moderate protein calorie malnutrition Albumin 3.0, consider providing protein supplement when patient is more alert to take oral intake  Chronic atrial fibrillation Home Cardizem will be temporarily held due to soft BP Eliquis will be temporarily held due to GI bleed   GERD  Continue Protonix  Chronic thrombocytopenia Stable, continue to monitor platelet levels  DVT prophylaxis: SCDs  Code Status: Full code  Family Communication: Daughter at bedside (all questions answered to satisfaction)  Disposition Plan:  Patient is from:                        home Anticipated DC to:                   SNF or family members home Anticipated DC date:               2-3 days Anticipated DC barriers:          Patient requires inpatient management due to acute on chronic respiratory failure, acute metabolic cephalopathy, CHF and GI bleed requiring inpatient management and pending cardiology and gastroenterology consult  Consults called: Cardiology, gastroenterology  Admission status: Inpatient    Bernadette Hoit MD [facility]  01/29/2021, 12:35 AM

## 2021-01-28 NOTE — ED Triage Notes (Addendum)
Pt to the ED with acute renal failure and a hemoglobin of 5.7.  Pt to the ED through the tunnel from the [facility] center.  Pt is altered and does not answer questions.

## 2021-01-28 NOTE — ED Provider Notes (Addendum)
[facility] Provider Note   CSN: [id number] Arrival date & time: 01/28/21  1539  LEVEL 5 CAVEAT - ALTERED MENTAL STATUS   History Chief Complaint  Patient presents with   Altered Mental Status    [name] is a 87 y.o. female.  HPI 87 year old female presents from her facility with abnormal blood work including hemoglobin of 5.7 and acute kidney injury.  She is reportedly normally conversant but today when evaluated she was lethargic and less responsive.  She apparently fell recently.  Labs were obtained at her facility and she was noted to have a new creatinine of 2 and a hemoglobin of 5.7.  She is on Eliquis and had a fall.  She did apparently hit her head.  She also has been having some congestion and cough.  Right now the patient seems awake and whispers to me but further history is unobtainable due to mental status change.  Past Medical History:  Diagnosis Date   Anemia    Atrial fibrillation (HCC)    Back pain    Bacterial pneumonia    Cancer ([place])    Skin    Chronic respiratory failure ([place])    Cognitive communication deficit    COPD (chronic obstructive pulmonary disease) ([place])    Dementia ([place])    Depression    Diabetes mellitus without complication ([place])    Diverticulosis    Fatty liver    Fibromyalgia    Gastric polyp 09/08/11   at the anastomosis-inflammatory   Generalized anxiety disorder    GERD (gastroesophageal reflux disease)    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)    Hypercholesteremia    Hypertension    Insomnia    Internal hemorrhoids    Muscle weakness    Neuropathy    On home O2    qhs   Osteoporosis    Rheumatoid arteritis ([place])    Ulcer    stomach   Vitamin D deficiency     Patient Active Problem List   Diagnosis Date Noted   Fall at nursing home 01/28/2021   CHF (congestive heart failure) ([place]) 01/28/2021   Edema 01/19/2021   Chronic anemia 10/14/2020   Unspecified protein-calorie malnutrition  ([place]) 10/14/2020   Acute on chronic diastolic (congestive) heart failure ([place]) 06/10/2020   Aortic atherosclerosis ([place]) 05/13/2020   Thrombocytopenia ([place]) 01/06/2020   Medicare annual wellness visit, subsequent 02/12/2019   Major depression, recurrent, chronic ([place]) 11/19/2018   Dyslipidemia associated with type 2 diabetes mellitus ([place]) 05/03/2018   Diabetic peripheral neuropathy associated with type 2 diabetes mellitus ([place]) 05/03/2018   Vascular dementia without behavioral disturbance ([place]) 05/03/2018   Post-menopausal osteoporosis 05/03/2018   Chronic constipation 05/03/2018   Left shoulder pain 07/25/2017   Chronic diastolic heart failure ([place]) 07/03/2017   RA (rheumatoid arthritis) ([place]) 05/03/2017   Type 2 diabetes, controlled, with peripheral neuropathy ([place]) 04/20/2017   CKD stage 3 due to type 2 diabetes mellitus ([place]) 04/01/2017   Depression 03/31/2017   Peripheral neuropathy 08/01/2016   Hypertension 07/26/2016   Prolonged QT interval 07/26/2016   Chronic respiratory failure with hypoxia ([place]) 07/21/2013   Hypokalemia 07/21/2013   Atrial fibrillation with RVR ([place]) 07/21/2013   Bilateral knee pain 05/21/2013   Fibromyalgia    COPD (chronic obstructive pulmonary disease) (HCC)    GERD (gastroesophageal reflux disease)    Back pain     Past Surgical History:  Procedure Laterality Date   ABDOMINAL HYSTERECTOMY     ABDOMINAL SURGERY  removed patial stomach from ulcer   COLONOSCOPY  04-2001   internal hemorrhoids, panocolonic diverticulosis, and colon polyps    UPPER GASTROINTESTINAL ENDOSCOPY       OB History     Gravida  6   Para  6   Term  6   Preterm      AB      Living  4      SAB      IAB      Ectopic      Multiple      Live Births              Family History  Problem Relation Age of Onset   Heart disease Father    Kidney disease Mother        kidney cancer   Colon cancer Daughter 74    Social History   Tobacco Use    Smoking status: Never   Smokeless tobacco: Never  Vaping Use   Vaping Use: Never used  Substance Use Topics   Alcohol use: No   Drug use: No    Home Medications Prior to Admission medications   Medication Sig Start Date End Date Taking? Authorizing Provider  acetaminophen (TYLENOL) 650 MG CR tablet Take 650 mg by mouth every 8 (eight) hours.    [provider]  Amino Acids-Protein Hydrolys (PRO-STAT) LIQD Take by mouth. liquid; 15-100 gram-kcal/30 mL; oral Three Times A Day  Special Instructions: for albumin 2.8    [provider]  apixaban (ELIQUIS) 2.5 MG TABS tablet Take 2.5 mg by mouth 2 (two) times daily.    [provider]  denosumab (PROLIA) 60 MG/ML SOSY injection Inject 60 mg into the skin every 6 (six) months.    [provider]  diltiazem (CARDIZEM CD) 300 MG 24 hr capsule Take 300 mg by mouth daily. 02/05/19   [provider]  donepezil (ARICEPT) 10 MG tablet Take 10 mg by mouth at bedtime. For unspecified dementia without behavioral disturbance]    [provider]  DULoxetine (CYMBALTA) 20 MG capsule Take 40 mg by mouth daily.  10/18/19   [provider]  Fluticasone-Umeclidin-Vilant (TRELEGY ELLIPTA) 100-62.5-25 MCG/INH AEPB One puff inhalation once a day    [provider]  folic acid (FOLVITE) 1 MG tablet Take 5 mg by mouth as directed. Once a day on Sunday    [provider]  guaiFENesin (MUCINEX) 600 MG 12 hr tablet Take 600 mg by mouth 2 (two) times daily.    [provider]  methotrexate (RHEUMATREX) 2.5 MG tablet Take 15 mg by mouth once a week. On Sunday - Caution:Chemotherapy. Protect from light.    [provider]  miconazole (ZEASORB-AF) 2 % powder Apply 1 application topically 2 (two) times daily as needed.    [provider]  NON FORMULARY Diet Type:  NAS,  Consistent Carbohydrates, Dysphagia 3 05/06/17   [provider]  ondansetron (ZOFRAN) 4 MG  tablet Take 4 mg by mouth every 6 (six) hours as needed for nausea or vomiting.    [provider]  OXYGEN Inhale 3.5 L/min into the lungs continuous. 05/06/17   [provider]  pantoprazole (PROTONIX) 40 MG tablet Take 40 mg by mouth 2 (two) times daily. 02/05/19   [provider]  phenylephrine-shark liver oil-mineral oil-petrolatum (PREPARATION H) 0.25-3-14-71.9 % rectal ointment Place 1 application rectally 2 (two) times daily as needed for hemorrhoids.    [provider]  polyethylene glycol (  MIRALAX / GLYCOLAX) packet Take 17 g by mouth daily as needed for moderate constipation. 04/12/17   Johnson, Clanford L, MD  potassium chloride SA (K-DUR,KLOR-CON) 20 MEQ tablet Take 40 mEq by mouth 3 (three) times daily. 02/05/19   [provider]  pregabalin (LYRICA) 50 MG capsule Take 1 capsule by mouth  Twice daily 01/12/21   Gerlene Fee, NP  torsemide (DEMADEX) 20 MG tablet Take 80 mg by mouth 2 (two) times daily.    [provider]  traMADol (ULTRAM) 50 MG tablet Take 0.5 tablets (25 mg total) by mouth at bedtime. 01/12/21   Gerlene Fee, NP  vitamin B-12 (CYANOCOBALAMIN) 1000 MCG tablet Take 1,000 mcg by mouth daily. 8 am for b12 level of 388    [provider]    Allergies    Other, Tape, Lipitor [atorvastatin calcium], and Niaspan [niacin er]  Review of Systems   Review of Systems  Unable to perform ROS: Mental status change   Physical Exam Updated Vital Signs BP (!) 127/54   Pulse 82   Temp 98.4 F (36.9 C) (Oral)   Resp 17   SpO2 100%   Physical Exam Vitals and nursing note reviewed.  Constitutional:      Appearance: She is well-developed. She is obese.  HENT:     Head: Normocephalic and atraumatic.     Right Ear: External ear normal.     Left Ear: External ear normal.     Nose: Nose normal.     Mouth/Throat:     Comments: There is dried blood to her lips and some mild blood in her mouth but no active  bleeding Eyes:     General:        Right eye: No discharge.        Left eye: No discharge.     Pupils: Pupils are equal, round, and reactive to light.  Cardiovascular:     Rate and Rhythm: Normal rate. Rhythm irregular.     Heart sounds: Normal heart sounds.  Pulmonary:     Effort: Pulmonary effort is normal.     Breath sounds: Wheezing present.  Abdominal:     General: There is no distension.     Palpations: Abdomen is soft.     Tenderness: There is no abdominal tenderness.  Musculoskeletal:     Right lower leg: Edema present.     Left lower leg: Edema present.     Comments: Left index finger is diffusely bruised, as well as some of radial hand No tenderness or pain with ROM of either hip Both lower legs/ankles/feet are wrapped. Legs are swollen proximal to this  Skin:    General: Skin is warm and dry.  Neurological:     Mental Status: She is alert.     Comments: Awake, alert, responds to name, only whispers in response  Psychiatric:        Mood and Affect: Mood is not anxious.    ED Results / Procedures / Treatments   Labs (all labs ordered are listed, but only abnormal results are displayed) Labs Reviewed  HEPATIC FUNCTION PANEL - Abnormal; Notable for the following components:      Result Value   Total Protein 5.9 (*)    Albumin 3.0 (*)    AST 14 (*)    Total Bilirubin 1.3 (*)    Bilirubin, Direct 0.4 (*)    All other components within normal limits  BRAIN NATRIURETIC PEPTIDE - Abnormal; Notable for the  following components:   B Natriuretic Peptide 416.0 (*)    All other components within normal limits  URINALYSIS, ROUTINE W REFLEX MICROSCOPIC - Abnormal; Notable for the following components:   Color, Urine STRAW (*)    Hgb urine dipstick MODERATE (*)    Leukocytes,Ua MODERATE (*)    WBC, UA >50 (*)    Bacteria, UA RARE (*)    All other components within normal limits  BLOOD GAS, ARTERIAL - Abnormal; Notable for the following components:   pCO2 arterial 56.5  (*)    pO2, Arterial 33.4 (*)    Bicarbonate 29.1 (*)    Acid-Base Excess 5.6 (*)    All other components within normal limits  VITAMIN B12 - Abnormal; Notable for the following components:   Vitamin B-12 2,703 (*)    All other components within normal limits  IRON AND TIBC - Abnormal; Notable for the following components:   Iron 245 (*)    Saturation Ratios 88 (*)    All other components within normal limits  RETICULOCYTES - Abnormal; Notable for the following components:   Retic Ct Pct <0.4 (*)    RBC. 2.21 (*)    All other components within normal limits  POC OCCULT BLOOD, ED - Abnormal; Notable for the following components:   Fecal Occult Bld POSITIVE (*)    All other components within normal limits  RESP PANEL BY RT-PCR (FLU A&B, COVID) ARPGX2  AMMONIA  FOLATE  FERRITIN  COMPREHENSIVE METABOLIC PANEL  CBC  MAGNESIUM  PHOSPHORUS  TYPE AND SCREEN  PREPARE RBC (CROSSMATCH)    EKG EKG Interpretation  Date/Time:  Thursday January 28 2021 16:58:49 EST Ventricular Rate:  83 PR Interval:    QRS Duration: 69 QT Interval:  359 QTC Calculation: 422 R Axis:   72 Text Interpretation: Atrial fibrillation Low voltage, extremity and precordial leads nonspecific T waves Confirmed by Sherwood Gambler [phone]) on 01/28/2021 7:49:26 PM  Radiology CT Head Wo Contrast  Result Date: 01/28/2021 CLINICAL DATA:  Head trauma, minor (Age >= 65y). Altered mental status. Fall. EXAM: CT HEAD WITHOUT CONTRAST TECHNIQUE: Contiguous axial images were obtained from the base of the skull through the vertex without intravenous contrast. COMPARISON:  04/10/2017 FINDINGS: Brain: There is atrophy and chronic small vessel disease changes. No acute intracranial abnormality. Specifically, no hemorrhage, hydrocephalus, mass lesion, acute infarction, or significant intracranial injury. Vascular: No hyperdense vessel or unexpected calcification. Skull: No acute calvarial abnormality. Sinuses/Orbits: No acute  findings Other: None IMPRESSION: Atrophy, chronic microvascular disease. No acute intracranial abnormality. Electronically Signed   By: Rolm Baptise M.D.   On: 01/28/2021 18:21   DG Chest Portable 1 View  Result Date: 01/28/2021 CLINICAL DATA:  Cough and congestion.  Acute renal failure. EXAM: PORTABLE CHEST 1 VIEW COMPARISON:  10/26/2020 FINDINGS: Chronic cardiomegaly. Unchanged mediastinal contours. Aortic atherosclerosis. Diffuse interstitial and bronchial thickening suspicious for pulmonary edema, worsened in the interim. Suspected small pleural effusions. Additional patchy bibasilar opacities. No pneumothorax. Degenerative change of both shoulders. No acute osseous abnormalities are seen. The bones are diffusely under mineralized. IMPRESSION: 1. Cardiomegaly with pulmonary edema and small pleural effusions consistent with CHF. 2. Patchy bibasilar opacities, atelectasis versus pneumonia. Electronically Signed   By: Keith Rake M.D.   On: 01/28/2021 18:15   DG Hand Complete Left  Result Date: 01/28/2021 CLINICAL DATA:  Fall today. EXAM: LEFT HAND - COMPLETE 3+ VIEW COMPARISON:  None. FINDINGS: No acute fracture or dislocation. The bones are diffusely under mineralized. Osteoarthritis of the  digits primarily affecting the distal interphalangeal joints. Mild osteoarthritis of the thumb at the carpal metacarpal joint. No erosive change. No focal soft tissue abnormalities. IMPRESSION: 1. No acute fracture or dislocation of the left hand. 2. Osteoarthritis and osteopenia. Electronically Signed   By: Keith Rake M.D.   On: 01/28/2021 18:14    Procedures .Critical Care Performed by: Sherwood Gambler, MD Authorized by: Sherwood Gambler, MD   Critical care provider statement:    Critical care time (minutes):  35   Critical care time was exclusive of:  Separately billable procedures and treating other patients   Critical care was necessary to treat or prevent imminent or life-threatening  deterioration of the following conditions:  Respiratory failure and circulatory failure   Critical care was time spent personally by me on the following activities:  Development of treatment plan with patient or surrogate, discussions with consultants, evaluation of patient's response to treatment, examination of patient, ordering and review of laboratory studies, ordering and review of radiographic studies, ordering and performing treatments and interventions, pulse oximetry, re-evaluation of patient's condition and review of old charts   Medications Ordered in ED Medications  furosemide (LASIX) injection 20 mg (has no administration in time range)  ipratropium-albuterol (DUONEB) 0.5-2.5 (3) MG/3ML nebulizer solution 3 mL (3 mLs Nebulization Given 01/28/21 1632)  0.9 %  sodium chloride infusion (10 mL/hr Intravenous New Bag/Given 01/28/21 1708)  furosemide (LASIX) injection 40 mg (40 mg Intravenous Given 01/28/21 1837)    ED Course  I have reviewed the triage vital signs and the nursing notes.  Pertinent labs & imaging results that were available during my care of the patient were reviewed by me and considered in my medical decision making (see chart for details).    MDM Rules/Calculators/A&P                           Unclear exact cause of why the patient is more altered.  She does have a mildly elevated CO2 but it appears to be compensated in a chronic nature.  Otherwise, she has this new anemia and was given a unit of blood.  She will also be given IV Lasix as she appears to have fluid overload.  She is coughing but she is not febrile and has a normal WBC and the chest x-ray seems more consistent with CHF rather than pneumonia.  She will need admission. Dr. Josephine Cables to admit. Final Clinical Impression(s) / ED Diagnoses Final diagnoses:  Symptomatic anemia  Acute congestive heart failure, unspecified heart failure type [facility])    Rx / DC Orders ED Discharge Orders     None         Sherwood Gambler, MD 01/28/21 2349    Sherwood Gambler, MD 01/28/21 [phone]

## 2021-01-29 ENCOUNTER — Inpatient Hospital Stay (HOSPITAL_COMMUNITY): Payer: Medicare Other

## 2021-01-29 ENCOUNTER — Encounter (HOSPITAL_COMMUNITY): Payer: Self-pay | Admitting: Internal Medicine

## 2021-01-29 DIAGNOSIS — D649 Anemia, unspecified: Secondary | ICD-10-CM

## 2021-01-29 DIAGNOSIS — R195 Other fecal abnormalities: Secondary | ICD-10-CM

## 2021-01-29 DIAGNOSIS — G9341 Metabolic encephalopathy: Secondary | ICD-10-CM | POA: Diagnosis not present

## 2021-01-29 DIAGNOSIS — I509 Heart failure, unspecified: Secondary | ICD-10-CM | POA: Diagnosis not present

## 2021-01-29 DIAGNOSIS — R7989 Other specified abnormal findings of blood chemistry: Secondary | ICD-10-CM

## 2021-01-29 DIAGNOSIS — I5031 Acute diastolic (congestive) heart failure: Secondary | ICD-10-CM | POA: Diagnosis not present

## 2021-01-29 DIAGNOSIS — N179 Acute kidney failure, unspecified: Secondary | ICD-10-CM | POA: Diagnosis not present

## 2021-01-29 DIAGNOSIS — J9621 Acute and chronic respiratory failure with hypoxia: Secondary | ICD-10-CM | POA: Diagnosis not present

## 2021-01-29 DIAGNOSIS — K922 Gastrointestinal hemorrhage, unspecified: Secondary | ICD-10-CM | POA: Diagnosis not present

## 2021-01-29 LAB — ECHOCARDIOGRAM COMPLETE
AR max vel: 1.69 cm2
AV Area VTI: 1.34 cm2
AV Area mean vel: 1.45 cm2
AV Mean grad: 19.5 mmHg
AV Peak grad: 34.6 mmHg
Ao pk vel: 2.94 m/s
Area-P 1/2: 2.88 cm2
Height: 63 in
MV VTI: 1.93 cm2
S' Lateral: 3.4 cm
Weight: 3276.92 oz

## 2021-01-29 LAB — POTASSIUM
Potassium: 3.3 mmol/L — ABNORMAL LOW (ref 3.5–5.1)
Potassium: 2.9 mmol/L — ABNORMAL LOW (ref 3.5–5.1)
Potassium: 2.9 mmol/L — ABNORMAL LOW (ref 3.5–5.1)

## 2021-01-29 LAB — CBC
HCT: 22.1 % — ABNORMAL LOW (ref 36.0–46.0)
Hemoglobin: 6.1 g/dL — CL (ref 12.0–15.0)
MCH: 25.1 pg — ABNORMAL LOW (ref 26.0–34.0)
MCHC: 27.6 g/dL — ABNORMAL LOW (ref 30.0–36.0)
MCV: 90.9 fL (ref 80.0–100.0)
Platelets: 31 10*3/uL — ABNORMAL LOW (ref 150–400)
RBC: 2.43 MIL/uL — ABNORMAL LOW (ref 3.87–5.11)
RDW: 24.9 % — ABNORMAL HIGH (ref 11.5–15.5)
WBC: 2 10*3/uL — ABNORMAL LOW (ref 4.0–10.5)
nRBC: 0 % (ref 0.0–0.2)

## 2021-01-29 LAB — COMPREHENSIVE METABOLIC PANEL
ALT: 10 U/L (ref 0–44)
AST: 12 U/L — ABNORMAL LOW (ref 15–41)
Albumin: 2.8 g/dL — ABNORMAL LOW (ref 3.5–5.0)
Alkaline Phosphatase: 57 U/L (ref 38–126)
Anion gap: 7 (ref 5–15)
BUN: 51 mg/dL — ABNORMAL HIGH (ref 8–23)
CO2: 32 mmol/L (ref 22–32)
Calcium: 9.3 mg/dL (ref 8.9–10.3)
Chloride: 103 mmol/L (ref 98–111)
Creatinine, Ser: 1.61 mg/dL — ABNORMAL HIGH (ref 0.44–1.00)
GFR, Estimated: 30 mL/min — ABNORMAL LOW (ref >60)
Glucose, Bld: 141 mg/dL — ABNORMAL HIGH (ref 70–99)
Potassium: 3.1 mmol/L — ABNORMAL LOW (ref 3.5–5.1)
Sodium: 142 mmol/L (ref 135–145)
Total Bilirubin: 1.4 mg/dL — ABNORMAL HIGH (ref 0.3–1.2)
Total Protein: 5.8 g/dL — ABNORMAL LOW (ref 6.5–8.1)

## 2021-01-29 LAB — MRSA NEXT GEN BY PCR, NASAL: MRSA by PCR Next Gen: NOT DETECTED

## 2021-01-29 LAB — MAGNESIUM: Magnesium: 2.3 mg/dL (ref 1.7–2.4)

## 2021-01-29 LAB — PREPARE RBC (CROSSMATCH)

## 2021-01-29 LAB — PHOSPHORUS: Phosphorus: 5.2 mg/dL — ABNORMAL HIGH (ref 2.5–4.6)

## 2021-01-29 LAB — PROCALCITONIN: Procalcitonin: 0.19 ng/mL

## 2021-01-29 MED ORDER — SODIUM CHLORIDE 0.9% IV SOLUTION: Freq: Once | INTRAVENOUS | Status: AC

## 2021-01-29 MED ORDER — PANTOPRAZOLE SODIUM 40 MG IV SOLR
40.0000 mg | INTRAVENOUS | Status: DC
  Administered 2021-01-29 – 2021-01-31 (×3): 40 mg via INTRAVENOUS
  Filled 2021-01-29 (×3): qty 40

## 2021-01-29 MED ORDER — SODIUM CHLORIDE 0.9 % IV SOLN
1.0000 g | INTRAVENOUS | Status: AC
  Administered 2021-01-29 – 2021-01-31 (×3): 1 g via INTRAVENOUS
  Filled 2021-01-29 (×3): qty 10

## 2021-01-29 MED ORDER — SODIUM ZIRCONIUM CYCLOSILICATE 10 G PO PACK: 10.0000 g | PACK | Freq: Once | ORAL | Status: DC

## 2021-01-29 MED ORDER — FUROSEMIDE 10 MG/ML IJ SOLN
20.0000 mg | Freq: Once | INTRAMUSCULAR | Status: AC
  Administered 2021-01-29: 20 mg via INTRAVENOUS
  Filled 2021-01-29: qty 2

## 2021-01-29 MED ORDER — POTASSIUM CHLORIDE CRYS ER 20 MEQ PO TBCR
40.0000 meq | EXTENDED_RELEASE_TABLET | Freq: Once | ORAL | Status: AC
  Administered 2021-01-29: 40 meq via ORAL
  Filled 2021-01-29: qty 2

## 2021-01-29 MED ORDER — CHLORHEXIDINE GLUCONATE CLOTH 2 % EX PADS
6.0000 | MEDICATED_PAD | Freq: Every day | CUTANEOUS | Status: DC
  Administered 2021-01-29 – 2021-02-02 (×5): 6 via TOPICAL

## 2021-01-29 MED ORDER — DOXYCYCLINE HYCLATE 100 MG PO TABS
100.0000 mg | ORAL_TABLET | Freq: Two times a day (BID) | ORAL | Status: DC
  Administered 2021-01-29 (×2): 100 mg via ORAL
  Filled 2021-01-29 (×2): qty 1

## 2021-01-29 MED ORDER — DM-GUAIFENESIN ER 30-600 MG PO TB12
1.0000 | ORAL_TABLET | Freq: Two times a day (BID) | ORAL | Status: DC
  Administered 2021-01-29 – 2021-02-02 (×8): 1 via ORAL
  Filled 2021-01-29 (×8): qty 1

## 2021-01-29 NOTE — Evaluation (Signed)
Occupational Therapy Evaluation Patient Details Name: [name] MRN: [id number] DOB: 08-03-1928 Today's Date: 01/29/2021   History of Present Illness [name] is a 87 y.o. female with medical history significant for chronic respiratory failure on supplemental oxygen at 2.5 LPM, CKD stage III, rheumatoid arthritis, A. fib on Eliquis, GERD, COPD who presents to the emergency department via EMS due to altered mental status.  Patient was unable to provide history due to altered mental status, history was obtained from ED physician, ED medical record and daughter at bedside.  Per report, patient was reported to sustain a fall yesterday (11/16) when she hit her head, labs were obtained at her facility and patient's creatinine was noted to be at 2 and hemoglobin at 5.7.  She was reported to have had several days of cough and congestion.  Today, she was noted to be less responsive and lethargic (patient was usually able to maintain reasonable conversation at baseline).  Patient ambulates with a wheelchair and able to self transfer to bed with and without assistance at baseline.   Clinical Impression   Pt agreeable to OT/PT evaluation. Pt presents with [facility] and expressive communication difficulties. Pt required Min G to min A for bed mobility with HOB elevated. Pt demonstrates need for min to mod A for sit to stand mobility and lateral steps to head of bed. Pt was unable to track a visual stimulus without often squinting eyes shut as if the task were difficult. Pt presents with weakness limiting shoulder A/ROM to ~90* as well. Pt will benefit from continued OT in the hospital and recommended venue below to increase strength, balance, and endurance for safe ADL's.        Recommendations for follow up therapy are one component of a multi-disciplinary discharge planning process, led by the attending physician.  Recommendations may be updated based on patient status, additional functional  criteria and insurance authorization.   Follow Up Recommendations  Skilled nursing-short term rehab (<3 hours/day)    Assistance Recommended at Discharge Intermittent Supervision/Assistance  Functional Status Assessment  Patient has had a recent decline in their functional status and/or demonstrates limited ability to make significant improvements in function in a reasonable and predictable amount of time  Equipment Recommendations  None recommended by OT    Recommendations for Other Services       Precautions / Restrictions Precautions Precautions: Fall Restrictions Weight Bearing Restrictions: No      Mobility Bed Mobility Overal bed mobility: Needs Assistance Bed Mobility: Supine to Sit;Sit to Supine     Supine to sit: Min assist;HOB elevated Sit to supine: Min guard   General bed mobility comments: Slow labored movement, required increased time    Transfers Overall transfer level: Needs assistance Equipment used: Rolling walker (2 wheels) Transfers: Sit to/from Stand Sit to Stand: Mod assist;Min assist           General transfer comment: slow labored movement      Balance Overall balance assessment: Needs assistance Sitting-balance support: Feet supported;Single extremity supported Sitting balance-Leahy Scale: Good Sitting balance - Comments: fair/good seated at EOB   Standing balance support: Bilateral upper extremity supported;During functional activity;Reliant on assistive device for balance Standing balance-Leahy Scale: Fair Standing balance comment: using RW                           ADL either performed or assessed with clinical judgement   ADL Overall ADL's : Needs assistance/impaired  Toilet Transfer: Minimal assistance;Moderate assistance;Stand-pivot;Rolling walker (2 wheels) Toilet Transfer Details (indicate cue type and reason): Partially simulated via sit to stand from EOB.          Functional mobility during ADLs: Moderate assistance;Minimal assistance;Rolling walker (2 wheels) (Lateral steps to head of bed.)       Vision   Vision Assessment?: Yes Tracking/Visual Pursuits: Other (comment) (Pt would close eyes and have moderate difficulty tracking an object in all quadrants. Attempts appeared to be difficult for pt.) Additional Comments: Unable to fully assess vision due to pt cognitive status.            Pertinent Vitals/Pain Pain Assessment: Faces Faces Pain Scale: Hurts even more Pain Location: B Legs Pain Descriptors / Indicators: Guarding Pain Intervention(s): Limited activity within patient's tolerance;Monitored during session;Repositioned     Hand Dominance     Extremity/Trunk Assessment Upper Extremity Assessment Upper Extremity Assessment: RUE deficits/detail;LUE deficits/detail RUE Deficits / Details: Limited to ~90* A/ROM supine in bed. WFL P/ROM. Generally weak with bruising noted up R arm and wrist. Able to complete full A/ROM of wrist and composit grasp. LUE Deficits / Details: Limited to ~90* A/ROM supine in bed. WFL P/ROM. Generally weak. Able to complete full A/ROM of wrist and composit grasp.   Lower Extremity Assessment Lower Extremity Assessment: Defer to PT evaluation   Cervical / Trunk Assessment Cervical / Trunk Assessment: Normal   Communication Communication Communication: HOH;Expressive difficulties   Cognition Arousal/Alertness: Awake/alert Behavior During Therapy: WFL for tasks assessed/performed Overall Cognitive Status: Within Functional Limits for tasks assessed                                                        Home Living Family/patient expects to be discharged to:: [place]: Wheelchair - manual   Additional Comments: Per patient's chart.      Prior Functioning/Environment Prior Level of Function : Needs  assist             Mobility Comments: Patient ambulates with a wheelchair and able to self transfer to bed with and without assistance at baseline per daughter. ADLs Comments: Assisted by SNF staff as needed.        OT Problem List: Decreased strength;Decreased range of motion;Decreased activity tolerance;Impaired balance (sitting and/or standing);Impaired vision/perception;Decreased coordination;Impaired UE functional use      OT Treatment/Interventions: Self-care/ADL training;Therapeutic exercise;Energy conservation;Therapeutic activities;Cognitive remediation/compensation;Visual/perceptual remediation/compensation;Patient/family education;Balance training    OT Goals(Current goals can be found in the care plan section) Acute Rehab OT Goals Patient Stated Goal: none stated OT Goal Formulation: Patient unable to participate in goal setting Time For Goal Achievement: 02/12/21 Potential to Achieve Goals: Fair  OT Frequency: Min 2X/week               Co-evaluation PT/OT/SLP Co-Evaluation/Treatment: Yes Reason for Co-Treatment: Complexity of the patient's impairments (multi-system involvement) PT goals addressed during session: Mobility/safety with mobility OT goals addressed during session: ADL's and self-care                       End of Session Equipment Utilized During Treatment: Rolling walker (2 wheels);Oxygen  Activity  Tolerance: Patient tolerated treatment well Patient left: in bed;with call bell/phone within reach  OT Visit Diagnosis: Unsteadiness on feet (R26.81);Other abnormalities of gait and mobility (R26.89);Muscle weakness (generalized) (M62.81);Low vision, both eyes (H54.2);Cognitive communication deficit (R41.841);Other symptoms and signs involving cognitive function;History of falling (Z91.81)                Time: 2909-0301 OT Time Calculation (min): 11 min Charges:  OT General Charges $OT Visit: 1 Visit OT Evaluation $OT Eval Moderate  Complexity: 1 Mod  Crayton Savarese OT, MOT  Larey Seat 01/29/2021, 12:18 PM

## 2021-01-29 NOTE — ED Notes (Signed)
Consulted RT regarding pt breathing. Rt assessed pt

## 2021-01-29 NOTE — Consult Note (Signed)
Referring Provider: Roxan Hockey, MD Primary Care Physician:  Gerlene Fee, NP Primary Gastroenterologist:  last seen by Dr. Carlean Purl (2013)  Reason for Consultation:  GI bleed  HPI: [name] is a 87 y.o. female from SNF [facility] with past medical history significant for A. 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  According to notes, patient had a fall at bedside at [facility] yesterday.  She hit her head.  Noted to have cough for several days including hemoptysis.  Labs also indicated hemoglobin of 5.7.  Sent to the ED because of being less responsive.  In the ED: Intermittently tachypneic with hypoxia, O2 sats in the 70-80 range at times.  Improved with supplemental oxygen via nasal cannula.  Hemoglobin of 5.7 down from 8.1 in June. MCV 91.6.  Platelets 55,000 (baseline around 100,000 for the past year), folate 22.2, B12 2703, iron 245, iron saturations 88%, TIBC 278, ferritin 104, potassium 5.7.  I FOBT positive.  BNP 416.  Influenza A, B, SARS coronavirus 2 all negative.    CT of head without acute abnormality.  Chest x-ray showed cardiomegaly with pulmonary edema and small pleural effusions consistent with CHF and patchy bibasilar opacities, atelectasis versus pneumonia.  Today her hemoglobin is 6.1 after 1 unit of packed red blood cells.  Her platelet count is down to 31,000.  White blood cell count 2000.  Vital signs stable, O2 sats 97% on 3 L nasal cannula.  She is resting comfortably.  Easily arousable.  Only verbal communication is asking for water.  Spoke with nursing staff, patient rested throughout the night.  Voices discomfort with palpation of legs but otherwise does not verbally communicate.  There has been no overt GI bleeding overnight. Spoke with patient's  daughter, [name], by phone. Mother has had declining appetite for some time. She refuses to eat meat for one year. Source of protein is peanut butter. She is unaware of any issues with bowels except possibly some constipation. No reported melena, brbpr. She has had several falls over the past several months. Patient forgets to ask for help when transitioning out of bed to her wheelchair (due to her dementia).   EGD June 2013: -Billroth II anatomy -Sessile polyp at the anastomosis -Pathology revealed polypoid reactive and inflamed mucosa with extensive gastric foveolar metaplasia, acute inflammation and granulation tissue consistent with anastomotic inflammatory polyp, no dysplasia or malignancy.    Colonoscopy February 2003: -Internal hemorrhoids -Pancolonic diverticula -Polyps on stalks in the sigmoid and descending colon removed. -Path not available   Prior to Admission medications   Medication Sig Start Date End Date Taking? Authorizing Provider  acetaminophen (TYLENOL) 650 MG CR tablet Take 650 mg by mouth every 8 (eight) hours.    [provider]  Amino Acids-Protein Hydrolys (PRO-STAT) LIQD Take by mouth. liquid; 15-100 gram-kcal/30 mL; oral Three Times A Day  Special Instructions: for albumin 2.8    [provider]  apixaban (ELIQUIS) 2.5 MG TABS tablet Take 2.5 mg by mouth 2 (two) times daily.    [provider]  denosumab (PROLIA) 60 MG/ML SOSY injection Inject 60 mg into the skin every 6 (six) months.    [provider]  diltiazem (CARDIZEM CD) 300 MG 24 hr capsule Take 300 mg by mouth daily. 02/05/19   [provider]  donepezil (ARICEPT)  10 MG tablet Take 10 mg by mouth at bedtime. For unspecified dementia without behavioral disturbance]    [provider]  DULoxetine (CYMBALTA) 20 MG capsule Take 40 mg by mouth daily.  10/18/19   [provider]  Fluticasone-Umeclidin-Vilant (TRELEGY ELLIPTA) 100-62.5-25 MCG/INH  AEPB One puff inhalation once a day    [provider]  folic acid (FOLVITE) 1 MG tablet Take 5 mg by mouth as directed. Once a day on Sunday    [provider]  guaiFENesin (MUCINEX) 600 MG 12 hr tablet Take 600 mg by mouth 2 (two) times daily.    [provider]  methotrexate (RHEUMATREX) 2.5 MG tablet Take 15 mg by mouth once a week. On Sunday - Caution:Chemotherapy. Protect from light.    [provider]  miconazole (ZEASORB-AF) 2 % powder Apply 1 application topically 2 (two) times daily as needed.    [provider]  NON FORMULARY Diet Type:  NAS,  Consistent Carbohydrates, Dysphagia 3 05/06/17   [provider]  ondansetron (ZOFRAN) 4 MG tablet Take 4 mg by mouth every 6 (six) hours as needed for nausea or vomiting.    [provider]  OXYGEN Inhale 3.5 L/min into the lungs continuous. 05/06/17   [provider]  pantoprazole (PROTONIX) 40 MG tablet Take 40 mg by mouth 2 (two) times daily. 02/05/19   [provider]  phenylephrine-shark liver oil-mineral oil-petrolatum (PREPARATION H) 0.25-3-14-71.9 % rectal ointment Place 1 application rectally 2 (two) times daily as needed for hemorrhoids.    [provider]  polyethylene glycol (MIRALAX / GLYCOLAX) packet Take 17 g by mouth daily as needed for moderate constipation. 04/12/17   Johnson, Clanford L, MD  potassium chloride SA (K-DUR,KLOR-CON) 20 MEQ tablet Take 40 mEq by mouth 3 (three) times daily. 02/05/19   [provider]  pregabalin (LYRICA) 50 MG capsule Take 1 capsule by mouth  Twice daily 01/12/21   Gerlene Fee, NP  torsemide (DEMADEX) 20 MG tablet Take 80 mg by mouth 2 (two) times daily.    [provider]  traMADol (ULTRAM) 50 MG tablet Take 0.5 tablets (25 mg total) by mouth at bedtime. 01/12/21   Gerlene Fee, NP  vitamin B-12 (CYANOCOBALAMIN) 1000 MCG tablet Take 1,000 mcg by mouth daily. 8 am for b12 level of 388     [provider]    Current Facility-Administered Medications  Medication Dose Route Frequency Provider Last Rate Last Admin   Chlorhexidine Gluconate Cloth 2 % PADS 6 each  6 each Topical Daily Adefeso, Oladapo, DO       dextromethorphan-guaiFENesin (MUCINEX DM) 30-600 MG per 12 hr tablet 1 tablet  1 tablet Oral BID Adefeso, Oladapo, DO       furosemide (LASIX) injection 20 mg  20 mg Intravenous BID Adefeso, Oladapo, DO       pantoprazole (PROTONIX) injection 40 mg  40 mg Intravenous Q24H Adefeso, Oladapo, DO   40 mg at 01/29/21 0112   sodium zirconium cyclosilicate (LOKELMA) packet 10 g  10 g Oral Once Adefeso, Oladapo, DO        Allergies as of 01/28/2021 - Review Complete 01/28/2021  Allergen Reaction Noted   Other  09/08/2011   Tape  10/16/2011   Lipitor [atorvastatin calcium] Rash and Other (See Comments) 09/20/2010   Niaspan [niacin er] Rash and Other (See Comments) 09/20/2010    Past Medical History:  Diagnosis Date   Anemia    Atrial fibrillation ([place])  Back pain    Bacterial pneumonia    Cancer ([place])    Skin    Chronic respiratory failure (HCC)    Cognitive communication deficit    COPD (chronic obstructive pulmonary disease) (HCC)    Dementia (HCC)    Depression    Diabetes mellitus without complication ([place])    Diverticulosis    Fatty liver    Fibromyalgia    Gastric polyp 09/08/11   at the anastomosis-inflammatory   Generalized anxiety disorder    GERD (gastroesophageal reflux disease)    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)    Hypercholesteremia    Hypertension    Insomnia    Internal hemorrhoids    Muscle weakness    Neuropathy    On home O2    qhs   Osteoporosis    Rheumatoid arteritis ([place])    Ulcer    stomach   Vitamin D deficiency     Past Surgical History:  Procedure Laterality Date   ABDOMINAL HYSTERECTOMY     ABDOMINAL SURGERY     removed patial stomach from ulcer   COLONOSCOPY  04/14/2001   internal hemorrhoids,  panocolonic diverticulosis, and colon polyps    UPPER GASTROINTESTINAL ENDOSCOPY     Gessner: Billroth II anatomy, sessile polyp at the anastomosis.  Pathology revealed polypoid reactive and inflamed mucosa with extensive gastric foveolar metaplasia, acute inflammation and granulation tissue consistent with anastomotic inflammatory polyp.    Family History  Problem Relation Age of Onset   Heart disease Father    Kidney disease Mother        kidney cancer   Colon cancer Daughter 16    Social History   Socioeconomic History   Marital status: Widowed    Spouse name: Not on file   Number of children: 6   Years of education: Not on file   Highest education level: Not on file  Occupational History   Occupation: Retired  Tobacco Use   Smoking status: Never   Smokeless tobacco: Never  Vaping Use   Vaping Use: Never used  Substance and Sexual Activity   Alcohol use: No   Drug use: No   Sexual activity: Not Currently  Other Topics Concern   Not on file  Social History Narrative   Long term resident of [facility]    Social Determinants of Health   Financial Resource Strain: Not on file  Food Insecurity: Not on file  Transportation Needs: Not on file  Physical Activity: Not on file  Stress: Not on file  Social Connections: Not on file  Intimate Partner Violence: Not on file     ROS: Unobtainable      Physical Examination: Vital signs in last 24 hours: Temp:  [97.9 F (36.6 C)-98.5 F (36.9 C)] 97.9 F (36.6 C) (11/18 0500) Pulse Rate:  [59-96] 71 (11/18 0600) Resp:  [15-29] 16 (11/18 0600) BP: (92-152)/(40-126) 111/56 (11/18 0600) SpO2:  [73 %-100 %] 100 % (11/18 0600) FiO2 (%):  [32 %] 32 % (11/17 2058) Weight:  [91.4 kg-92.9 kg] 92.9 kg (11/18 0500) Last BM Date:  (PTA)  General: Elderly frail-appearing female, audible congestion.  Arouses easily. Head: Normocephalic, atraumatic.   Eyes: Conjunctiva pink, no icterus. Mouth: Oropharyngeal mucosa dry.  Dried blood  noted on her lips.   Neck: Supple without thyromegaly, masses, or lymphadenopathy.  Lungs: Scattered rhonchi and rales.  Heart: Irregular rhythm, slight tachycardia, no murmurs rubs or gallops.  Abdomen: Bowel sounds are normal, nontender, nondistended, no hepatosplenomegaly or masses, no abdominal  bruits or    hernia , no rebound or guarding.   Rectal: Not performed Extremities: Bruising noted on both upper extremities, bilateral lower extremities 1-2+ edema.  Neuro: Alert.  Skin: Warm and dry, no rash or jaundice.  Bruising upper extremity especially hands Psych: Alert and calm        Intake/Output from previous day: 11/17 0701 - 11/18 0700 In: 700 [Blood:700] Out: 2200 [Urine:2200] Intake/Output this shift: No intake/output data recorded.  Lab Results: CBC Recent Labs    01/28/21 1424 01/29/21 0349  WBC 4.5 2.0*  HGB 5.7* 6.1*  HCT 20.8* 22.1*  MCV 91.6 90.9  PLT 55* 31*   BMET Recent Labs    01/28/21 1424 01/29/21 0349  NA 140 142  K 5.7* 3.1*  CL 105 103  CO2 29 32  GLUCOSE 134* 141*  BUN 58* 51*  CREATININE 2.06* 1.61*  CALCIUM 9.8 9.3   LFT Recent Labs    01/28/21 1634 01/29/21 0349  BILITOT 1.3* 1.4*  BILIDIR 0.4*  --   IBILI 0.9  --   ALKPHOS 59 57  AST 14* 12*  ALT 12 10  PROT 5.9* 5.8*  ALBUMIN 3.0* 2.8*    Lipase No results for input(s): LIPASE in the last 72 hours.  PT/INR No results for input(s): LABPROT, INR in the last 72 hours.    Imaging Studies: CT Head Wo Contrast  Result Date: 01/28/2021 CLINICAL DATA:  Head trauma, minor (Age >= 65y). Altered mental status. Fall. EXAM: CT HEAD WITHOUT CONTRAST TECHNIQUE: Contiguous axial images were obtained from the base of the skull through the vertex without intravenous contrast. COMPARISON:  04/10/2017 FINDINGS: Brain: There is atrophy and chronic small vessel disease changes. No acute intracranial abnormality. Specifically, no hemorrhage, hydrocephalus, mass lesion, acute infarction, or  significant intracranial injury. Vascular: No hyperdense vessel or unexpected calcification. Skull: No acute calvarial abnormality. Sinuses/Orbits: No acute findings Other: None IMPRESSION: Atrophy, chronic microvascular disease. No acute intracranial abnormality. Electronically Signed   By: Rolm Baptise M.D.   On: 01/28/2021 18:21   DG Chest Portable 1 View  Result Date: 01/28/2021 CLINICAL DATA:  Cough and congestion.  Acute renal failure. EXAM: PORTABLE CHEST 1 VIEW COMPARISON:  10/26/2020 FINDINGS: Chronic cardiomegaly. Unchanged mediastinal contours. Aortic atherosclerosis. Diffuse interstitial and bronchial thickening suspicious for pulmonary edema, worsened in the interim. Suspected small pleural effusions. Additional patchy bibasilar opacities. No pneumothorax. Degenerative change of both shoulders. No acute osseous abnormalities are seen. The bones are diffusely under mineralized. IMPRESSION: 1. Cardiomegaly with pulmonary edema and small pleural effusions consistent with CHF. 2. Patchy bibasilar opacities, atelectasis versus pneumonia. Electronically Signed   By: Keith Rake M.D.   On: 01/28/2021 18:15   DG Hand Complete Left  Result Date: 01/28/2021 CLINICAL DATA:  Fall today. EXAM: LEFT HAND - COMPLETE 3+ VIEW COMPARISON:  None. FINDINGS: No acute fracture or dislocation. The bones are diffusely under mineralized. Osteoarthritis of the digits primarily affecting the distal interphalangeal joints. Mild osteoarthritis of the thumb at the carpal metacarpal joint. No erosive change. No focal soft tissue abnormalities. IMPRESSION: 1. No acute fracture or dislocation of the left hand. 2. Osteoarthritis and osteopenia. Electronically Signed   By: Keith Rake M.D.   On: 01/28/2021 18:14  [4 week]   Impression: 87 year old female from SNF with past medical history significant for A. 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  GI consulted for GI bleed.  GI bleed: Presenting with hemoglobin of 5.7, normal MCV.  Also with significant thrombocytopenia.  Suspect multifactorial.  She has chronic kidney disease.  Her hemoglobin was in the 9-10 range back in March.  Heme-negative stool x2 a few months ago.  This admission she was heme positive.  There has been no reported overt GI bleeding.  She has evidence of dried blood oral cavity.  "Coughed up blood" recently.  Remote colonoscopy with hemorrhoids, diverticulosis, polyps removed but path unavailable.  EGD 2013 Billroth II anatomy, inflammatory polyp at the anastomosis.  At this time patient is not a good candidate for endoscopic procedure based on respiratory status, pulmonary edema+/-pneumonia, thrombocytopenia.  Would consider holding off on endoscopic evaluation for now unless evidence of overt GI bleeding. Discussed at length with patient's daughter, Corky Sing. Patient has told her before she does not want any "heroic measures" but Adela Lank would like to consider treatment options that are beneficial to improving her mother's remaining time on earth.    Plan: Continue IV pantoprazole twice daily. Transfuse as needed.  Would recommend additional unit of packed red blood cells today. Monitor for overt GI bleeding. Will follow with you.  We would like to thank you for the opportunity to participate in the care of [name].  Laureen Ochs. Bernarda Caffey [facility] [phone] 11/18/202210:00 AM    LOS: 1 day

## 2021-01-29 NOTE — ED Notes (Signed)
Repositioned pt in bed to sit up at 45 angle instead of lying flat. Pt O2 sat was 85%. After sitting up, pt O2 sat improved to 96%

## 2021-01-29 NOTE — ED Notes (Signed)
Upon IV assessment, IV fluids were clamped off. Restarted IV fluids at 10 ml/hr with 600 ml left.

## 2021-01-29 NOTE — Plan of Care (Addendum)
  Problem: Acute Rehab PT Goals(only PT should resolve) Goal: Pt Will Go Supine/Side To Sit Outcome: Progressing Flowsheets (Taken 01/29/2021 1140) Pt will go Supine/Side to Sit: with min guard assist Goal: Patient Will Transfer Sit To/From Stand Outcome: Progressing Flowsheets (Taken 01/29/2021 1140) Patient will transfer sit to/from stand:  with min guard assist  with minimal assist Goal: Pt Will Transfer Bed To Chair/Chair To Bed Outcome: Progressing Flowsheets (Taken 01/29/2021 1140) Pt will Transfer Bed to Chair/Chair to Bed:  min guard assist  with min assist Goal: Pt Will Ambulate Outcome: Progressing Flowsheets (Taken 01/29/2021 1140) Pt will Ambulate:  10 feet  with moderate assist  with rolling walker     Cassie Jones, SPT  During this treatment session, the therapist was present, participating in and directing the treatment.  12:11 PM, 01/29/21 Lonell Grandchild, MPT Physical Therapist with [facility] [phone] office [phone] mobile phone

## 2021-01-29 NOTE — Progress Notes (Signed)
PROGRESS NOTE     Rhena Glace, is a 87 y.o. female, DOB - 1928/09/23, BSJ:[id number]  Admit date - 01/28/2021   Admitting Physician Bernadette Hoit, DO  Outpatient Primary MD for the patient is Gerlene Fee, NP  LOS - 1  Chief Complaint  Patient presents with   Altered Mental Status        Brief Narrative:  87 y.o. female with medical history significant for chronic respiratory failure on supplemental oxygen at 2.5 LPM, CKD stage III, rheumatoid arthritis, A. fib on Eliquis, GERD, COPD admitted on 01/28/2021 with acute metabolic cephalopathy secondary to acute on chronic respiratory failure with hypoxia and hypercapnia in the setting of CHF and COPD exacerbation, compounded by acute on chronic anemia with concern for acute GI bleed  Assessment & Plan:   Principal Problem:   CHF (congestive heart failure) ([place]) Active Problems:   COPD (chronic obstructive pulmonary disease) (HCC)   GERD (gastroesophageal reflux disease)   Acute kidney injury superimposed on CKD ([place])   Acute on chronic respiratory failure with hypoxia and hypercapnia (HCC)   Atrial fibrillation, chronic (HCC)   Thrombocytopenia ([place])   Anemia   Fall at nursing home   GI bleed   Acute metabolic encephalopathy   Hyperkalemia   Hypoalbuminemia due to protein-calorie malnutrition (HCC)   Elevated brain natriuretic peptide (BNP) level   Occult GI bleeding   1) acute on chronic anemia secondary to presumed acute GI bleed- -GI service input appreciated -Patient apparently not a candidate for endoluminal evaluation at this time given comorbid conditions and overall frail health Need to have further conversation with patient and daughter about risk versus benefits of anticoagulation -We will transfuse 1 unit of PRBC at this time -Continue IV Protonix  2)Acute on chronic hypoxic and hypercapnic respiratory failure--- suspect some component of pneumonia- -At baseline patient uses 2.5 L of oxygen via  nasal cannula currently requiring 4 L of oxygen via nasal cannula to keep O2 sats above 90% -Chest x-ray suggestive of possible pneumonia as well as CHF PCT 0.19 -Patient with pancytopenia -Continue IV Rocephin and doxycycline  3)HFpEF-patient presenting with acute on chronic diastolic dysfunction CHF exacerbation  -BNP 416,  previous level 144 --Chest x-ray suggestive of possible pneumonia as well as CHF -Echo with EF of 65 to 70%, with severely dilated left and right atria and moderate pulmonary hypertension as well as moderate aortic stenosis and moderate mitral regurg -Continue IV Lasix as ordered -Daily weights and fluid input and output monitoring  4) chronic atrial fibrillation--hold Eliquis due to concerns for GI bleed and worsening thrombocytopenia -Hold Cardizem due to soft BP  5) acute metabolic encephalopathy--- secondary to #1 #2 #3 above -CT head without acute findings -Ammonia is not elevated -Mentation improving  6)Pancytopenia--- patient has history of chronic thrombocytopenia and chronic anemia -Thrombocytopenia worsened with platelets of 31k- transfuse if less than 20,000 given high risk of bleeding from a GI standpoint, also transfuse if bleeding is noted and platelet count is less than 50 K -Leukopenia appears to be new may be related to infection  7)CKD IV--  - renally adjust medications, avoid nephrotoxic agents / dehydration  / hypotension  Disposition/Need for in-Hospital Stay- patient unable to be discharged at this time due to -acute hypoxic and hypercapnic respiratory failure secondary to CHF and pneumonia and concerns for GI bleed requiring further interventions including transfusion, IV antibiotics and IV Lasix -Medically unstable for discharge, anticipate patient will need to be here for at least another  48 hours  Status is: Inpatient  Remains inpatient appropriate because: Please see disposition above  Disposition: The patient is from:  Long-term  resident of SNF              Anticipated d/c is to: SNF              Anticipated d/c date is: 2 days              Patient currently is not medically stable to d/c. Barriers: Not Clinically Stable-   Code Status :  -  Code Status: DNR   Family Communication:   (patient is alert, awake and coherent)  -Daughter is her primary current Consults  :  Gi  DVT Prophylaxis  :   - SCDs   SCDs Start: 01/28/21 2058    Lab Results  Component Value Date   PLT 31 (L) 01/29/2021    Inpatient Medications  Scheduled Meds:  sodium chloride   Intravenous Once   Chlorhexidine Gluconate Cloth  6 each Topical Daily   dextromethorphan-guaiFENesin  1 tablet Oral BID   doxycycline  100 mg Oral Q12H   furosemide  20 mg Intravenous BID   furosemide  20 mg Intravenous Once   pantoprazole (PROTONIX) IV  40 mg Intravenous Q24H   potassium chloride  40 mEq Oral Once   Continuous Infusions:  cefTRIAXone (ROCEPHIN)  IV 1 g (01/29/21 0916)   PRN Meds:.   Anti-infectives (From admission, onward)    Start     Dose/Rate Route Frequency Ordered Stop   01/29/21 1000  doxycycline (VIBRA-TABS) tablet 100 mg        100 mg Oral Every 12 hours 01/29/21 0826     01/29/21 0915  cefTRIAXone (ROCEPHIN) 1 g in sodium chloride 0.9 % 100 mL IVPB        1 g 200 mL/hr over 30 Minutes Intravenous Every 24 hours 01/29/21 3419           Subjective: Anastasia Pall today has no fevers, no emesis,  No chest pain,   -Mentation appears to be improving, cough and shortness of breath is not worse   Objective: Vitals:   01/29/21 0755 01/29/21 0807 01/29/21 1151 01/29/21 1636  BP:      Pulse:      Resp:      Temp: (!) 96.8 F (36 C)  (!) 97.2 F (36.2 C) 97.6 F (36.4 C)  TempSrc: Axillary  Axillary Axillary  SpO2:  97%    Weight:      Height:        Intake/Output Summary (Last 24 hours) at 01/29/2021 1919 Last data filed at 01/29/2021 1850 Gross per 24 hour  Intake 350 ml  Output 2800 ml  Net -2450 ml    Filed Weights   01/29/21 0500  Weight: 92.9 kg    Physical Exam  Gen:- Awake Alert, in no acute distress, HEENT:- [place].AT, No sclera icterus Neck-Supple Neck,No JVD,.  Nose- [place] 4L/min Lungs-diminished breath sounds scattered rhonchi bilaterally  CV- S1, S2 normal, irregularly irregular  abd-  +ve B.Sounds, Abd Soft, No tenderness,    Extremity/Skin:- +ve  edema, pedal pulses present  Psych-affect is flat,, occasional episodes of disorientation  neuro-generalized weakness, no new focal deficits, no tremors  Data Reviewed: I have personally reviewed following labs and imaging studies  CBC: Recent Labs  Lab 01/28/21 1424 01/29/21 0349  WBC 4.5 2.0*  NEUTROABS 3.9  --   HGB 5.7* 6.1*  HCT 20.8* 22.1*  MCV 91.6 90.9  PLT 55* 31*   Basic Metabolic Panel: Recent Labs  Lab 01/28/21 1424 01/29/21 0349 01/29/21 0846 01/29/21 1251 01/29/21 1657  NA 140 142  --   --   --   K 5.7* 3.1* 3.3* 2.9* 2.9*  CL 105 103  --   --   --   CO2 29 32  --   --   --   GLUCOSE 134* 141*  --   --   --   BUN 58* 51*  --   --   --   CREATININE 2.06* 1.61*  --   --   --   CALCIUM 9.8 9.3  --   --   --   MG  --  2.3  --   --   --   PHOS  --  5.2*  --   --   --    GFR: Estimated Creatinine Clearance: 25.2 mL/min (A) (by C-G formula based on SCr of 1.61 mg/dL (H)). Liver Function Tests: Recent Labs  Lab 01/28/21 1634 01/29/21 0349  AST 14* 12*  ALT 12 10  ALKPHOS 59 57  BILITOT 1.3* 1.4*  PROT 5.9* 5.8*  ALBUMIN 3.0* 2.8*   No results for input(s): LIPASE, AMYLASE in the last 168 hours. Recent Labs  Lab 01/28/21 1633  AMMONIA 17   Coagulation Profile: No results for input(s): INR, PROTIME in the last 168 hours. Cardiac Enzymes: No results for input(s): CKTOTAL, CKMB, CKMBINDEX, TROPONINI in the last 168 hours. BNP (last 3 results) No results for input(s): PROBNP in the last 8760 hours. HbA1C: No results for input(s): HGBA1C in the last 72 hours. CBG: No results for  input(s): GLUCAP in the last 168 hours. Lipid Profile: No results for input(s): CHOL, HDL, LDLCALC, TRIG, CHOLHDL, LDLDIRECT in the last 72 hours. Thyroid Function Tests: No results for input(s): TSH, T4TOTAL, FREET4, T3FREE, THYROIDAB in the last 72 hours. Anemia Panel: Recent Labs    01/28/21 1424  VITAMINB12 2,703*  FOLATE 22.2  FERRITIN 104  TIBC 278  IRON 245*  RETICCTPCT <0.4*   Urine analysis:    Component Value Date/Time   COLORURINE STRAW (A) 01/28/2021 1926   APPEARANCEUR CLEAR 01/28/2021 1926   LABSPEC 1.008 01/28/2021 1926   PHURINE 5.0 01/28/2021 1926   GLUCOSEU NEGATIVE 01/28/2021 1926   HGBUR MODERATE (A) 01/28/2021 1926   BILIRUBINUR NEGATIVE 01/28/2021 1926   KETONESUR NEGATIVE 01/28/2021 1926   PROTEINUR NEGATIVE 01/28/2021 1926   UROBILINOGEN 1.0 10/11/2014 1428   NITRITE NEGATIVE 01/28/2021 1926   LEUKOCYTESUR MODERATE (A) 01/28/2021 1926   Sepsis Labs: @LABRCNTIP (procalcitonin:4,lacticidven:4)  ) Recent Results (from the past 240 hour(s))  Expectorated Sputum Assessment w Gram Stain, Rflx to Resp Cult     Status: None   Collection Time: 01/28/21 12:53 PM   Specimen: Sputum  Result Value Ref Range Status   Specimen Description SPU  Final   Special Requests NONE  Final   Sputum evaluation   Final    THIS SPECIMEN IS ACCEPTABLE FOR SPUTUM CULTURE Performed at [facility], [address] [place], [place] [address]    Report Status 01/28/2021 FINAL  Final  Culture, Respiratory w Gram Stain     Status: None (Preliminary result)   Collection Time: 01/28/21 12:53 PM   Specimen: Sputum  Result Value Ref Range Status   Specimen Description   Final    SPU Performed at [facility], [address], [place], [place] [address]    Special Requests  Final    NONE Reflexed from [id number] Performed at [facility], [address], [place], [place] [address]    Gram Stain   Final    FEW SQUAMOUS EPITHELIAL CELLS PRESENT MODERATE WBC PRESENT,BOTH PMN AND  MONONUCLEAR ABUNDANT GRAM POSITIVE COCCI FEW GRAM NEGATIVE RODS RARE GRAM POSITIVE RODS Performed at [facility], [place] [address], [place], [place] [address]    Culture PENDING  Incomplete   Report Status PENDING  Incomplete  Resp Panel by RT-PCR (Flu A&B, Covid) Nasopharyngeal Swab     Status: None   Collection Time: 01/28/21  4:30 PM   Specimen: Nasopharyngeal Swab; Nasopharyngeal(NP) swabs in vial transport medium  Result Value Ref Range Status   SARS Coronavirus 2 by RT PCR NEGATIVE NEGATIVE Final    Comment: (NOTE) SARS-CoV-2 target nucleic acids are NOT DETECTED.  The SARS-CoV-2 RNA is generally detectable in upper respiratory specimens during the acute phase of infection. The lowest concentration of SARS-CoV-2 viral copies this assay can detect is 138 copies/mL. A negative result does not preclude SARS-Cov-2 infection and should not be used as the sole basis for treatment or other patient management decisions. A negative result may occur with  improper specimen collection/handling, submission of specimen other than nasopharyngeal swab, presence of viral mutation(s) within the areas targeted by this assay, and inadequate number of viral copies(<138 copies/mL). A negative result must be combined with clinical observations, patient history, and epidemiological information. The expected result is Negative.  Fact Sheet for Patients:  EntrepreneurPulse.com.au  Fact Sheet for Healthcare Providers:  IncredibleEmployment.be  This test is no t yet approved or cleared by the [place] FDA and  has been authorized for detection and/or diagnosis of SARS-CoV-2 by FDA under an Emergency Use Authorization (EUA). This EUA will remain  in effect (meaning this test can be used) for the duration of the COVID-19 declaration under Section 564(b)(1) of the Act, 21 U.S.C.section 360bbb-3(b)(1), unless the authorization is terminated  or revoked  sooner.       Influenza A by PCR NEGATIVE NEGATIVE Final   Influenza B by PCR NEGATIVE NEGATIVE Final    Comment: (NOTE) The Xpert Xpress SARS-CoV-2/FLU/RSV plus assay is intended as an aid in the diagnosis of influenza from Nasopharyngeal swab specimens and should not be used as a sole basis for treatment. Nasal washings and aspirates are unacceptable for Xpert Xpress SARS-CoV-2/FLU/RSV testing.  Fact Sheet for Patients: EntrepreneurPulse.com.au  Fact Sheet for Healthcare Providers: IncredibleEmployment.be  This test is not yet approved or cleared by the [place] FDA and has been authorized for detection and/or diagnosis of SARS-CoV-2 by FDA under an Emergency Use Authorization (EUA). This EUA will remain in effect (meaning this test can be used) for the duration of the COVID-19 declaration under Section 564(b)(1) of the Act, 21 U.S.C. section 360bbb-3(b)(1), unless the authorization is terminated or revoked.  Performed at [facility], [address] [place],  [address]   MRSA Next Gen by PCR, Nasal     Status: None   Collection Time: 01/29/21  5:09 AM   Specimen: Nasal Mucosa; Nasal Swab  Result Value Ref Range Status   MRSA by PCR Next Gen NOT DETECTED NOT DETECTED Final    Comment: (NOTE) The GeneXpert MRSA Assay (FDA approved for NASAL specimens only), is one component of a comprehensive MRSA colonization surveillance program. It is not intended to diagnose MRSA infection nor to guide or monitor treatment for MRSA infections. Test performance is not FDA approved in  patients less than 41 years old. Performed at [facility], [address], [place], [place] [address]     Radiology Studies: CT Head Wo Contrast  Result Date: 01/28/2021 CLINICAL DATA:  Head trauma, minor (Age >= 65y). Altered mental status. Fall. EXAM: CT HEAD WITHOUT CONTRAST TECHNIQUE: Contiguous axial images were obtained from the base of the skull  through the vertex without intravenous contrast. COMPARISON:  04/10/2017 FINDINGS: Brain: There is atrophy and chronic small vessel disease changes. No acute intracranial abnormality. Specifically, no hemorrhage, hydrocephalus, mass lesion, acute infarction, or significant intracranial injury. Vascular: No hyperdense vessel or unexpected calcification. Skull: No acute calvarial abnormality. Sinuses/Orbits: No acute findings Other: None IMPRESSION: Atrophy, chronic microvascular disease. No acute intracranial abnormality. Electronically Signed   By: Rolm Baptise M.D.   On: 01/28/2021 18:21   DG Chest Portable 1 View  Result Date: 01/28/2021 CLINICAL DATA:  Cough and congestion.  Acute renal failure. EXAM: PORTABLE CHEST 1 VIEW COMPARISON:  10/26/2020 FINDINGS: Chronic cardiomegaly. Unchanged mediastinal contours. Aortic atherosclerosis. Diffuse interstitial and bronchial thickening suspicious for pulmonary edema, worsened in the interim. Suspected small pleural effusions. Additional patchy bibasilar opacities. No pneumothorax. Degenerative change of both shoulders. No acute osseous abnormalities are seen. The bones are diffusely under mineralized. IMPRESSION: 1. Cardiomegaly with pulmonary edema and small pleural effusions consistent with CHF. 2. Patchy bibasilar opacities, atelectasis versus pneumonia. Electronically Signed   By: Keith Rake M.D.   On: 01/28/2021 18:15   DG Hand Complete Left  Result Date: 01/28/2021 CLINICAL DATA:  Fall today. EXAM: LEFT HAND - COMPLETE 3+ VIEW COMPARISON:  None. FINDINGS: No acute fracture or dislocation. The bones are diffusely under mineralized. Osteoarthritis of the digits primarily affecting the distal interphalangeal joints. Mild osteoarthritis of the thumb at the carpal metacarpal joint. No erosive change. No focal soft tissue abnormalities. IMPRESSION: 1. No acute fracture or dislocation of the left hand. 2. Osteoarthritis and osteopenia. Electronically  Signed   By: Keith Rake M.D.   On: 01/28/2021 18:14   ECHOCARDIOGRAM COMPLETE  Result Date: 01/29/2021    ECHOCARDIOGRAM REPORT   Patient Name:   BUFORD GAYLER Date of Exam: 01/29/2021 Medical Rec #:  [id number]          Height:       63.0 in Accession #:    [id number]         Weight:       204.8 lb Date of Birth:  Jul 25, 1928          BSA:          1.953 m Patient Age:    54 years           BP:           111/56 mmHg Patient Gender: F                  HR:           81 bpm. Exam Location:  [facility] Procedure: 2D Echo, Cardiac Doppler and Color Doppler Indications:    CHF-Acute Diastolic  History:        Patient has prior history of Echocardiogram examinations, most                 recent 07/27/2016. CHF, COPD, Arrythmias:Atrial Fibrillation;                 Risk Factors:Hypertension, Diabetes and Dyslipidemia.  Sonographer:    DBB Referring Phys: [id number] OLADAPO ADEFESO IMPRESSIONS  1. Left  ventricular ejection fraction, by estimation, is 65 to 70%. The left ventricle has normal function. The left ventricle has no regional wall motion abnormalities. The left ventricular internal cavity size was mildly dilated. There is mild left ventricular hypertrophy. Left ventricular diastolic parameters are indeterminate.  2. Right ventricular systolic function is normal. The right ventricular size is mildly enlarged. There is moderately elevated pulmonary artery systolic pressure. The estimated right ventricular systolic pressure is 62.7 mmHg.  3. Left atrial size was severely dilated.  4. Right atrial size was severely dilated.  5. The mitral valve is abnormal. Moderate mitral valve regurgitation. Moderate mitral annular calcification.  6. Tricuspid valve regurgitation is moderate.  7. The aortic valve is tricuspid. There is moderate calcification of the aortic valve. Aortic valve regurgitation is not visualized. Moderate aortic valve stenosis. Aortic valve mean gradient measures 19.5 mmHg. Dimentionless index  0.43.  8. The inferior vena cava is dilated in size with >50% respiratory variability, suggesting right atrial pressure of 8 mmHg. Comparison(s): No prior Echocardiogram. FINDINGS  Left Ventricle: Left ventricular ejection fraction, by estimation, is 65 to 70%. The left ventricle has normal function. The left ventricle has no regional wall motion abnormalities. The left ventricular internal cavity size was mildly dilated. There is  mild left ventricular hypertrophy. Left ventricular diastolic function could not be evaluated due to atrial fibrillation. Left ventricular diastolic parameters are indeterminate. Right Ventricle: The right ventricular size is mildly enlarged. No increase in right ventricular wall thickness. Right ventricular systolic function is normal. There is moderately elevated pulmonary artery systolic pressure. The tricuspid regurgitant velocity is 3.23 m/s, and with an assumed right atrial pressure of 8 mmHg, the estimated right ventricular systolic pressure is 03.5 mmHg. Left Atrium: Left atrial size was severely dilated. Right Atrium: Right atrial size was severely dilated. Pericardium: There is no evidence of pericardial effusion. Mitral Valve: The mitral valve is abnormal. Moderate mitral annular calcification. Moderate mitral valve regurgitation. MV peak gradient, 11.8 mmHg. The mean mitral valve gradient is 2.0 mmHg. Tricuspid Valve: The tricuspid valve is grossly normal. Tricuspid valve regurgitation is moderate. Aortic Valve: The aortic valve is tricuspid. There is moderate calcification of the aortic valve. There is mild aortic valve annular calcification. Aortic valve regurgitation is not visualized. Moderate aortic stenosis is present. Aortic valve mean gradient measures 19.5 mmHg. Aortic valve peak gradient measures 34.6 mmHg. Aortic valve area, by VTI measures 1.34 cm. Pulmonic Valve: The pulmonic valve was grossly normal. Pulmonic valve regurgitation is trivial. Aorta: The aortic  root is normal in size and structure. Venous: The inferior vena cava is dilated in size with greater than 50% respiratory variability, suggesting right atrial pressure of 8 mmHg. IAS/Shunts: No atrial level shunt detected by color flow Doppler.  LEFT VENTRICLE PLAX 2D LVIDd:         5.90 cm   Diastology LVIDs:         3.40 cm   LV e' medial:    6.31 cm/s LV PW:         0.90 cm   LV E/e' medial:  23.9 LV IVS:        1.20 cm   LV e' lateral:   9.14 cm/s LVOT diam:     2.00 cm   LV E/e' lateral: 16.5 LV SV:         88 LV SV Index:   45 LVOT Area:     3.14 cm  RIGHT VENTRICLE RV Basal diam:  3.50 cm RV Mid  diam:    3.60 cm RV S prime:     16.10 cm/s TAPSE (M-mode): 2.2 cm LEFT ATRIUM              Index        RIGHT ATRIUM           Index LA diam:        5.60 cm  2.87 cm/m   RA Area:     30.00 cm LA Vol (A2C):   169.0 ml 86.52 ml/m  RA Volume:   108.00 ml 55.29 ml/m LA Vol (A4C):   129.0 ml 66.04 ml/m LA Biplane Vol: 149.0 ml 76.28 ml/m  AORTIC VALVE                     PULMONIC VALVE AV Area (Vmax):    1.69 cm      PV Vmax:       1.12 m/s AV Area (Vmean):   1.45 cm      PV Peak grad:  5.0 mmHg AV Area (VTI):     1.34 cm AV Vmax:           294.00 cm/s AV Vmean:          204.500 cm/s AV VTI:            0.657 m AV Peak Grad:      34.6 mmHg AV Mean Grad:      19.5 mmHg LVOT Vmax:         158.00 cm/s LVOT Vmean:        94.400 cm/s LVOT VTI:          0.281 m LVOT/AV VTI ratio: 0.43  AORTA Ao Root diam: 2.60 cm Ao Asc diam:  3.40 cm MITRAL VALVE                TRICUSPID VALVE MV Area (PHT): 2.88 cm     TR Peak grad:   41.7 mmHg MV Area VTI:   1.93 cm     TR Vmax:        323.00 cm/s MV Peak grad:  11.8 mmHg MV Mean grad:  2.0 mmHg     SHUNTS MV Vmax:       1.72 m/s     Systemic VTI:  0.28 m MV Vmean:      66.6 cm/s    Systemic Diam: 2.00 cm MV Decel Time: 263 msec MV E velocity: 151.00 cm/s Rozann Lesches MD Electronically signed by Rozann Lesches MD Signature Date/Time: 01/29/2021/10:57:55 AM    Final       Scheduled Meds:  sodium chloride   Intravenous Once   Chlorhexidine Gluconate Cloth  6 each Topical Daily   dextromethorphan-guaiFENesin  1 tablet Oral BID   doxycycline  100 mg Oral Q12H   furosemide  20 mg Intravenous BID   furosemide  20 mg Intravenous Once   pantoprazole (PROTONIX) IV  40 mg Intravenous Q24H   potassium chloride  40 mEq Oral Once   Continuous Infusions:  cefTRIAXone (ROCEPHIN)  IV 1 g (01/29/21 0916)     LOS: 1 day   Roxan Hockey M.D on 01/29/2021 at 7:19 PM  Go to [email or web address] - for contact info  [facility] - Office  [phone]  If 7PM-7AM, please contact night-coverage [email or web address] Password [facility] 01/29/2021, 7:19 PM

## 2021-01-29 NOTE — Plan of Care (Signed)
  Problem: Acute Rehab OT Goals (only OT should resolve) Goal: Pt. Will Perform Grooming Flowsheets (Taken 01/29/2021 1221) Pt Will Perform Grooming:  standing  with supervision  with adaptive equipment Goal: Pt. Will Transfer To Toilet [place] (Taken 01/29/2021 1221) Pt Will Transfer to Toilet:  with supervision  stand pivot transfer Goal: Pt/Caregiver Will Perform Home Exercise Program Flowsheets (Taken 01/29/2021 1221) Pt/caregiver will Perform Home Exercise Program:  Increased ROM  Increased strength  Both right and left upper extremity  With minimal assist  Phallon Haydu OT, MOT

## 2021-01-29 NOTE — [place] FL2 (Signed)
[place] MEDICAID FL2 LEVEL OF CARE SCREENING TOOL     IDENTIFICATION  Patient Name: [name] Birthdate: April 09, 1928 Sex: female Admission Date (Current Location): 01/28/2021  [place] and [place] Number:  [name] [id number] [place] and Address:  [facility] [address], [place]      Provider Number: [id number]  Attending Physician Name and Address:  Roxan Hockey, MD  Relative Name and Phone Number:       Current Level of Care: Hospital Recommended Level of Care: [place] Prior Approval Number:    Date Approved/Denied:   PASRR Number:    Discharge Plan: SNF    Current Diagnoses: Patient Active Problem List   Diagnosis Date Noted   Elevated brain natriuretic peptide (BNP) level 01/29/2021   Occult GI bleeding    Fall at nursing home 01/28/2021   CHF (congestive heart failure) ([place]) 01/28/2021   GI bleed 93/23/5573   Acute metabolic encephalopathy 22/04/5425   Hyperkalemia 01/28/2021   Hypoalbuminemia due to protein-calorie malnutrition ([place]) 01/28/2021   Edema 01/19/2021   Anemia 10/14/2020   Unspecified protein-calorie malnutrition ([place]) 10/14/2020   Acute on chronic diastolic (congestive) heart failure ([place]) 06/10/2020   Aortic atherosclerosis ([place]) 05/13/2020   Thrombocytopenia ([place]) 01/06/2020   Medicare annual wellness visit, subsequent 02/12/2019   Major depression, recurrent, chronic ([place]) 11/19/2018   Dyslipidemia associated with type 2 diabetes mellitus ([place]) 05/03/2018   Diabetic peripheral neuropathy associated with type 2 diabetes mellitus ([place]) 05/03/2018   Vascular dementia without behavioral disturbance ([place]) 05/03/2018   Post-menopausal osteoporosis 05/03/2018   Chronic constipation 05/03/2018   Left shoulder pain 07/25/2017   Chronic diastolic heart failure ([place]) 07/03/2017   Atrial fibrillation, chronic (HCC) 05/03/2017   RA (rheumatoid arthritis) ([place]) 05/03/2017   Type 2  diabetes, controlled, with peripheral neuropathy ([place]) 04/20/2017   Acute on chronic respiratory failure with hypoxia and hypercapnia (HCC) 04/01/2017   CKD stage 3 due to type 2 diabetes mellitus ([place]) 04/01/2017   Depression 03/31/2017   Peripheral neuropathy 08/01/2016   Acute kidney injury superimposed on CKD ([place]) 07/26/2016   Hypertension 07/26/2016   Prolonged QT interval 07/26/2016   Chronic respiratory failure with hypoxia (HCC) 07/21/2013   Hypokalemia 07/21/2013   Atrial fibrillation with RVR ([place]) 07/21/2013   Bilateral knee pain 05/21/2013   Fibromyalgia    COPD (chronic obstructive pulmonary disease) (HCC)    GERD (gastroesophageal reflux disease)    Back pain     Orientation RESPIRATION BLADDER Height & Weight     Self  O2 (3L) External catheter Weight: 204 lb 12.9 oz (92.9 kg) Height:  5\' 3"  (160 cm)  BEHAVIORAL SYMPTOMS/MOOD NEUROLOGICAL BOWEL NUTRITION STATUS      Incontinent Diet (Heart healthy. See d/c summary for updates.)  AMBULATORY STATUS COMMUNICATION OF NEEDS Skin   Extensive Assist Verbally Bruising, Other (Comment) (Moisture associated skin damage. Wound to left pretibial distal.)                       Personal Care Assistance Level of Assistance  Bathing, Dressing, Feeding Bathing Assistance: Maximum assistance Feeding assistance: Limited assistance Dressing Assistance: Maximum assistance     Functional Limitations Info  Sight, Speech, Hearing Sight Info: Impaired Hearing Info: Impaired Speech Info: Adequate    SPECIAL CARE FACTORS FREQUENCY  PT (By licensed PT)     PT Frequency: 5x weekly              Contractures  Additional Factors Info  Code Status, Allergies Code Status Info: DNR Allergies Info: Other- Bandaids, Tape, Lipitor (atorvastatin Calcium), Niaspan (niacin Er)           Current Medications (01/29/2021):  This is the current hospital active medication list Current Facility-Administered Medications   Medication Dose Route Frequency Provider Last Rate Last Admin   cefTRIAXone (ROCEPHIN) 1 g in sodium chloride 0.9 % 100 mL IVPB  1 g Intravenous Q24H Emokpae, Courage, MD 200 mL/hr at 01/29/21 0916 1 g at 01/29/21 0916   Chlorhexidine Gluconate Cloth 2 % PADS 6 each  6 each Topical Daily Adefeso, Oladapo, DO   6 each at 01/29/21 0921   dextromethorphan-guaiFENesin (MUCINEX DM) 30-600 MG per 12 hr tablet 1 tablet  1 tablet Oral BID Adefeso, Oladapo, DO   1 tablet at 01/29/21 1043   doxycycline (VIBRA-TABS) tablet 100 mg  100 mg Oral Q12H Emokpae, Courage, MD   100 mg at 01/29/21 1043   furosemide (LASIX) injection 20 mg  20 mg Intravenous BID Adefeso, Oladapo, DO   20 mg at 01/29/21 0856   pantoprazole (PROTONIX) injection 40 mg  40 mg Intravenous Q24H Adefeso, Oladapo, DO   40 mg at 01/29/21 0112   sodium zirconium cyclosilicate (LOKELMA) packet 10 g  10 g Oral Once Bernadette Hoit, DO         Discharge Medications: Please see discharge summary for a list of discharge medications.  Relevant Imaging Results:  Relevant Lab Results:   Additional Information    Salome Arnt, LCSW

## 2021-01-29 NOTE — Progress Notes (Signed)
*  PRELIMINARY RESULTS* Echocardiogram 2D Echocardiogram has been performed.  Elpidio Anis 01/29/2021, 10:36 AM

## 2021-01-29 NOTE — TOC Initial Note (Addendum)
Transition of Care [facility]) - Initial/Assessment Note    Patient Details  Name: [name] MRN: [id number] Date of Birth: 12-23-1928  Transition of Care [facility]) CM/SW Contact:    Salome Arnt, [employer] Phone Number: 01/29/2021, 10:19 AM  Clinical Narrative:  Pt admitted due to CHF. Pt has been a resident at [facility] for about 5 years. Her daughter, [name] lives nearby and is very involved and supportive. Requesting return to [facility] when medically stable. Per Marianna Fuss at facility, pt is okay to return. Discussed CHF screening with [name] and awaiting return call from [facility] to complete screening. Pt was diagnosed with CHF in March 2020 per [name]. TOC will continue to follow.                 Update: Kerri at [facility] reports pt is on no added salt, consistent carb diet. She takes medications as prescribed. Facility does not do daily weights.   Expected Discharge Plan: Skilled Nursing Facility Barriers to Discharge: Continued Medical Work up   Patient Goals and CMS Choice Patient states their goals for this hospitalization and ongoing recovery are:: return to SNF   Choice offered to / list presented to : Adult Children  Expected Discharge Plan and Services Expected Discharge Plan: [place] In-house Referral: Clinical Social Work     Living arrangements for the past 2 months: [place]                 DME Arranged: N/A                    Prior Living Arrangements/Services Living arrangements for the past 2 months: [place] Lives with:: Facility Resident Patient language and need for interpreter reviewed:: Yes Do you feel safe going back to the place where you live?: Yes      Need for Family Participation in Patient Care: Yes (Comment) Care giver support system in place?: Yes (comment)   Criminal Activity/Legal Involvement Pertinent to Current Situation/Hospitalization: No - Comment as needed  Activities of Daily Living       Permission Sought/Granted Permission sought to share information with : Facility Art therapist granted to share information with : Yes, Verbal Permission Granted     Permission granted to share info w AGENCY: [place] granted to share info w Relationship: SNF     Emotional Assessment   Attitude/Demeanor/Rapport: Unable to Assess Affect (typically observed): Unable to Assess   Alcohol / Substance Use: Not Applicable Psych Involvement: No (comment)  Admission diagnosis:  CHF (congestive heart failure) (HCC) [I50.9] Symptomatic anemia [D64.9] Acute congestive heart failure, unspecified heart failure type [facility]) [I50.9] Patient Active Problem List   Diagnosis Date Noted   Elevated brain natriuretic peptide (BNP) level 01/29/2021   Fall at nursing home 01/28/2021   CHF (congestive heart failure) ([place]) 01/28/2021   GI bleed 16/60/6301   Acute metabolic encephalopathy 60/12/9321   Hyperkalemia 01/28/2021   Hypoalbuminemia due to protein-calorie malnutrition ([place]) 01/28/2021   Edema 01/19/2021   Chronic anemia 10/14/2020   Unspecified protein-calorie malnutrition ([place]) 10/14/2020   Acute on chronic diastolic (congestive) heart failure ([place]) 06/10/2020   Aortic atherosclerosis ([place]) 05/13/2020   Thrombocytopenia ([place]) 01/06/2020   Medicare annual wellness visit, subsequent 02/12/2019   Major depression, recurrent, chronic ([place]) 11/19/2018   Dyslipidemia associated with type 2 diabetes mellitus ([place]) 05/03/2018   Diabetic peripheral neuropathy associated with type 2 diabetes mellitus ([place]) 05/03/2018   Vascular dementia without behavioral disturbance (  [place]) 05/03/2018   Post-menopausal osteoporosis 05/03/2018   Chronic constipation 05/03/2018   Left shoulder pain 07/25/2017   Chronic diastolic heart failure ([place]) 07/03/2017   Atrial fibrillation, chronic ([place]) 05/03/2017   RA (rheumatoid arthritis) ([place]) 05/03/2017   Type 2 diabetes, controlled, with  peripheral neuropathy ([place]) 04/20/2017   Acute on chronic respiratory failure with hypoxia and hypercapnia ([place]) 04/01/2017   CKD stage 3 due to type 2 diabetes mellitus ([place]) 04/01/2017   Depression 03/31/2017   Peripheral neuropathy 08/01/2016   Acute kidney injury superimposed on CKD ([place]) 07/26/2016   Hypertension 07/26/2016   Prolonged QT interval 07/26/2016   Chronic respiratory failure with hypoxia ([place]) 07/21/2013   Hypokalemia 07/21/2013   Atrial fibrillation with RVR ([place]) 07/21/2013   Bilateral knee pain 05/21/2013   Fibromyalgia    COPD (chronic obstructive pulmonary disease) ([place])    GERD (gastroesophageal reflux disease)    Back pain    PCP:  Gerlene Fee, NP Pharmacy:   [facility] - [address] [place], [place] [address] Phone: [phone] Fax: [place], [place] [address] [place] [address] Phone: [phone] Fax: [phone]     Social Determinants of Health ([place]) Interventions    Readmission Risk Interventions Readmission Risk Prevention Plan 01/29/2021  Transportation Screening Complete  HRI or Home Care Consult Complete  Social Work Consult for [place] Planning/Counseling Complete  Palliative Care Screening Not Applicable  Medication Review Press photographer) Complete  Some recent data might be hidden

## 2021-01-29 NOTE — Evaluation (Addendum)
Physical Therapy Evaluation Patient Details Name: [name] MRN: [id number] DOB: 05/19/1928 Today's Date: 01/29/2021  History of Present Illness  [name] is a 87 y.o. female with medical history significant for chronic respiratory failure on supplemental oxygen at 2.5 LPM, CKD stage III, rheumatoid arthritis, A. fib on Eliquis, GERD, COPD who presents to the emergency department via EMS due to altered mental status.  Patient was unable to provide history due to altered mental status, history was obtained from ED physician, ED medical record and daughter at bedside.  Per report, patient was reported to sustain a fall yesterday (11/16) when she hit her head, labs were obtained at her facility and patient's creatinine was noted to be at 2 and hemoglobin at 5.7.  She was reported to have had several days of cough and congestion.  Today, she was noted to be less responsive and lethargic (patient was usually able to maintain reasonable conversation at baseline).  Patient ambulates with a wheelchair and able to self transfer to bed with and without assistance at baseline.   Clinical Impression  Patient presents in bed asleep on 4.5 LPM of O2 w/ SpO2 reading of 100%. Once awake, patient is alert and agreeable for therapy w/ bruises on B UE, demonstrated HOH. O2 lowered to 4 LPM for therapy session, SpO2 averaged 100%. Patient transitioned from supine w/ HOB elevated to sitting at EOB, required hand gestures and tactile cuing to understand therapist's instructions d/t difficulty hearing, demonstrated compliance and slow labored movement, required Min assist. Patient demonstrated fair steadiness on feet using the RW, limited to a few steps at bedside d/t generalized weakness and fatigue, required Min/Mod assist and visual cuing to complete sidesteps. Patient will benefit from continued skilled physical therapy in hospital and recommended venue below to increase strength, balance, endurance for  safe ADLs and gait.        Recommendations for follow up therapy are one component of a multi-disciplinary discharge planning process, led by the attending physician.  Recommendations may be updated based on patient status, additional functional criteria and insurance authorization.  Follow Up Recommendations Skilled nursing-short term rehab (<3 hours/day)    Assistance Recommended at Discharge Frequent or constant Supervision/Assistance  Functional Status Assessment Patient has had a recent decline in their functional status and demonstrates the ability to make significant improvements in function in a reasonable and predictable amount of time.  Equipment Recommendations  None recommended by PT    Recommendations for Other Services       Precautions / Restrictions Precautions Precautions: Fall Restrictions Weight Bearing Restrictions: No      Mobility  Bed Mobility Overal bed mobility: Needs Assistance Bed Mobility: Supine to Sit;Sit to Supine     Supine to sit: Min assist;HOB elevated Sit to supine: Min guard   General bed mobility comments: Slow labored movement, required increased time    Transfers Overall transfer level: Needs assistance Equipment used: Rolling walker (2 wheels) Transfers: Sit to/from Stand Sit to Stand: Mod assist;Min assist           General transfer comment: slow labored movement    Ambulation/Gait Ambulation/Gait assistance: Min assist;Mod assist Gait Distance (Feet): 3 Feet Assistive device: Rolling walker (2 wheels) Gait Pattern/deviations: Decreased step length - right;Decreased step length - left;Decreased stride length Gait velocity: Decreased     General Gait Details: slow labored movement, limited to a few steps at bedside d/t generalized weakness  Stairs  Wheelchair Mobility    Modified Rankin (Stroke Patients Only)       Balance Overall balance assessment: Needs assistance Sitting-balance support:  Feet supported;Single extremity supported Sitting balance-Leahy Scale: Good Sitting balance - Comments: fair/good seated at EOB   Standing balance support: Bilateral upper extremity supported;During functional activity;Reliant on assistive device for balance Standing balance-Leahy Scale: Fair Standing balance comment: using RW                             Pertinent Vitals/Pain Pain Assessment: Faces Faces Pain Scale: Hurts even more Pain Location: B Legs Pain Intervention(s): Limited activity within patient's tolerance;Monitored during session    Home Living Family/patient expects to be discharged to:: [place]: Wheelchair - manual Additional Comments: Per patient's chart.    Prior Function Prior Level of Function : Needs assist             Mobility Comments: Patient ambulates with a wheelchair and able to self transfer to bed with and without assistance at baseline per daughter. ADLs Comments: Assisted by SNF staff as needed.     Hand Dominance        Extremity/Trunk Assessment   Upper Extremity Assessment Upper Extremity Assessment: Defer to OT evaluation    Lower Extremity Assessment Lower Extremity Assessment: Generalized weakness       Communication   Communication: HOH;Expressive difficulties;Other (comment) (Patient unable to speak but opened mouth in attempt to respond to questions.)  Cognition Arousal/Alertness: Awake/alert Behavior During Therapy: WFL for tasks assessed/performed Overall Cognitive Status: Within Functional Limits for tasks assessed                                          General Comments      Exercises     Assessment/Plan    PT Assessment Patient needs continued PT services  PT Problem List Decreased strength;Decreased mobility;Decreased safety awareness;Decreased range of motion;Decreased coordination;Decreased activity tolerance;Decreased  balance;Decreased knowledge of use of DME       PT Treatment Interventions DME instruction;Therapeutic exercise;Gait training;Stair training;Balance training;Functional mobility training;Therapeutic activities;Patient/family education    PT Goals (Current goals can be found in the Care Plan section)  Acute Rehab PT Goals Patient Stated Goal: Return to SNF PT Goal Formulation: With patient Time For Goal Achievement: 02/11/21 Potential to Achieve Goals: Good    Frequency Min 3X/week   Barriers to discharge        Co-evaluation PT/OT/SLP Co-Evaluation/Treatment: Yes Reason for Co-Treatment: To address functional/ADL transfers PT goals addressed during session: Mobility/safety with mobility         AM-PAC PT "6 Clicks" Mobility  Outcome Measure Help needed turning from your back to your side while in a flat bed without using bedrails?: A Lot Help needed moving from lying on your back to sitting on the side of a flat bed without using bedrails?: A Lot Help needed moving to and from a bed to a chair (including a wheelchair)?: A Lot Help needed standing up from a chair using your arms (e.g., wheelchair or bedside chair)?: A Little Help needed to walk in hospital room?: A Lot Help needed climbing 3-5 steps with a railing? : Total 6 Click Score: 12    End of Session  Activity Tolerance: Patient tolerated treatment well;Patient limited by fatigue Patient left: in bed;with call bell/phone within reach;with nursing/sitter in room Nurse Communication: Mobility status PT Visit Diagnosis: Unsteadiness on feet (R26.81);Other abnormalities of gait and mobility (R26.89);Muscle weakness (generalized) (M62.81)    Time: 8372-9021 PT Time Calculation (min) (ACUTE ONLY): 20 min   Charges:   PT Evaluation $PT Eval Moderate Complexity: 1 Mod PT Treatments $Therapeutic Activity: 8-22 mins        Cassie Jones, SPT  During this treatment session, the therapist was present,  participating in and directing the treatment.  12:10 PM, 01/29/21 Lonell Grandchild, MPT Physical Therapist with [facility] 336 [phone] office [phone] mobile phone

## 2021-01-30 ENCOUNTER — Inpatient Hospital Stay (HOSPITAL_COMMUNITY): Payer: Medicare Other

## 2021-01-30 DIAGNOSIS — J9621 Acute and chronic respiratory failure with hypoxia: Secondary | ICD-10-CM | POA: Diagnosis not present

## 2021-01-30 DIAGNOSIS — G9341 Metabolic encephalopathy: Secondary | ICD-10-CM | POA: Diagnosis not present

## 2021-01-30 DIAGNOSIS — R195 Other fecal abnormalities: Secondary | ICD-10-CM | POA: Diagnosis not present

## 2021-01-30 DIAGNOSIS — N179 Acute kidney failure, unspecified: Secondary | ICD-10-CM

## 2021-01-30 DIAGNOSIS — I509 Heart failure, unspecified: Secondary | ICD-10-CM | POA: Diagnosis not present

## 2021-01-30 DIAGNOSIS — D649 Anemia, unspecified: Secondary | ICD-10-CM | POA: Diagnosis not present

## 2021-01-30 DIAGNOSIS — K922 Gastrointestinal hemorrhage, unspecified: Secondary | ICD-10-CM | POA: Diagnosis not present

## 2021-01-30 LAB — RENAL FUNCTION PANEL
Albumin: 2.7 g/dL — ABNORMAL LOW (ref 3.5–5.0)
Anion gap: 6 (ref 5–15)
BUN: 36 mg/dL — ABNORMAL HIGH (ref 8–23)
CO2: 39 mmol/L — ABNORMAL HIGH (ref 22–32)
Calcium: 9.3 mg/dL (ref 8.9–10.3)
Chloride: 99 mmol/L (ref 98–111)
Creatinine, Ser: 1.13 mg/dL — ABNORMAL HIGH (ref 0.44–1.00)
GFR, Estimated: 46 mL/min — ABNORMAL LOW (ref >60)
Glucose, Bld: 116 mg/dL — ABNORMAL HIGH (ref 70–99)
Phosphorus: 2.8 mg/dL (ref 2.5–4.6)
Potassium: 2.3 mmol/L — CL (ref 3.5–5.1)
Sodium: 144 mmol/L (ref 135–145)

## 2021-01-30 LAB — CBC
HCT: 24.9 % — ABNORMAL LOW (ref 36.0–46.0)
Hemoglobin: 7.2 g/dL — ABNORMAL LOW (ref 12.0–15.0)
MCH: 25.9 pg — ABNORMAL LOW (ref 26.0–34.0)
MCHC: 28.9 g/dL — ABNORMAL LOW (ref 30.0–36.0)
MCV: 89.6 fL (ref 80.0–100.0)
Platelets: 22 10*3/uL — CL (ref 150–400)
RBC: 2.78 MIL/uL — ABNORMAL LOW (ref 3.87–5.11)
RDW: 22.4 % — ABNORMAL HIGH (ref 11.5–15.5)
WBC: 1.5 10*3/uL — ABNORMAL LOW (ref 4.0–10.5)
nRBC: 0 % (ref 0.0–0.2)

## 2021-01-30 LAB — MAGNESIUM: Magnesium: 1.9 mg/dL (ref 1.7–2.4)

## 2021-01-30 MED ORDER — IPRATROPIUM-ALBUTEROL 0.5-2.5 (3) MG/3ML IN SOLN
3.0000 mL | Freq: Four times a day (QID) | RESPIRATORY_TRACT | Status: DC
  Administered 2021-01-30 – 2021-01-31 (×5): 3 mL via RESPIRATORY_TRACT
  Filled 2021-01-30 (×5): qty 3

## 2021-01-30 MED ORDER — DULOXETINE HCL 20 MG PO CPEP
40.0000 mg | ORAL_CAPSULE | Freq: Every day | ORAL | Status: DC
  Administered 2021-01-30 – 2021-02-02 (×4): 40 mg via ORAL
  Filled 2021-01-30 (×6): qty 2

## 2021-01-30 MED ORDER — DILTIAZEM HCL ER COATED BEADS 120 MG PO CP24
120.0000 mg | ORAL_CAPSULE | Freq: Every day | ORAL | Status: DC
  Administered 2021-01-31 – 2021-02-02 (×3): 120 mg via ORAL
  Filled 2021-01-30 (×3): qty 1

## 2021-01-30 MED ORDER — IPRATROPIUM-ALBUTEROL 0.5-2.5 (3) MG/3ML IN SOLN: 3.0000 mL | RESPIRATORY_TRACT | Status: DC | PRN

## 2021-01-30 MED ORDER — ENSURE ENLIVE PO LIQD
237.0000 mL | Freq: Two times a day (BID) | ORAL | Status: DC
  Administered 2021-01-30 – 2021-02-02 (×5): 237 mL via ORAL

## 2021-01-30 MED ORDER — TRAMADOL HCL 50 MG PO TABS
25.0000 mg | ORAL_TABLET | Freq: Every evening | ORAL | Status: DC | PRN
  Filled 2021-01-30: qty 1

## 2021-01-30 MED ORDER — IPRATROPIUM-ALBUTEROL 0.5-2.5 (3) MG/3ML IN SOLN: 3.0000 mL | Freq: Four times a day (QID) | RESPIRATORY_TRACT | Status: DC

## 2021-01-30 MED ORDER — POTASSIUM CHLORIDE CRYS ER 20 MEQ PO TBCR
40.0000 meq | EXTENDED_RELEASE_TABLET | Freq: Two times a day (BID) | ORAL | Status: DC
  Administered 2021-01-30: 40 meq via ORAL
  Filled 2021-01-30: qty 2

## 2021-01-30 MED ORDER — FLUTICASONE FUROATE-VILANTEROL 100-25 MCG/ACT IN AEPB
1.0000 | INHALATION_SPRAY | Freq: Every day | RESPIRATORY_TRACT | Status: DC
  Administered 2021-01-31 – 2021-02-03 (×4): 1 via RESPIRATORY_TRACT
  Filled 2021-01-30: qty 28

## 2021-01-30 MED ORDER — VITAMIN B-12 1000 MCG PO TABS
1000.0000 ug | ORAL_TABLET | Freq: Every day | ORAL | Status: DC
  Administered 2021-01-31 – 2021-02-02 (×3): 1000 ug via ORAL
  Filled 2021-01-30 (×3): qty 1

## 2021-01-30 MED ORDER — FLUTICASONE-UMECLIDIN-VILANT 100-62.5-25 MCG/ACT IN AEPB: 1.0000 | INHALATION_SPRAY | Freq: Every day | RESPIRATORY_TRACT | Status: DC

## 2021-01-30 MED ORDER — UMECLIDINIUM BROMIDE 62.5 MCG/ACT IN AEPB
1.0000 | INHALATION_SPRAY | Freq: Every day | RESPIRATORY_TRACT | Status: DC
  Administered 2021-01-31 – 2021-02-03 (×4): 1 via RESPIRATORY_TRACT
  Filled 2021-01-30: qty 7

## 2021-01-30 MED ORDER — POTASSIUM CHLORIDE 20 MEQ PO PACK
40.0000 meq | PACK | Freq: Three times a day (TID) | ORAL | Status: AC
  Administered 2021-01-30 – 2021-01-31 (×3): 40 meq via ORAL
  Filled 2021-01-30 (×3): qty 2

## 2021-01-30 NOTE — Progress Notes (Addendum)
PROGRESS NOTE   [name]  RSW:[id number] DOB: 08-25-1928 DOA: 01/28/2021 PCP: Gerlene Fee, NP   Chief Complaint  Patient presents with   Altered Mental Status   Level of care: Stepdown  Brief Admission History:  87 y.o. female with medical history significant for chronic respiratory failure on supplemental oxygen at 2.5 LPM, CKD stage III, rheumatoid arthritis, A. fib on Eliquis, GERD, COPD admitted on 01/28/2021 with acute metabolic cephalopathy secondary to acute on chronic respiratory failure with hypoxia and hypercapnia in the setting of CHF and COPD exacerbation, compounded by acute on chronic anemia with concern for acute GI bleed.    Assessment & Plan:   Principal Problem:   CHF (congestive heart failure) (HCC) Active Problems:   COPD (chronic obstructive pulmonary disease) (HCC)   GERD (gastroesophageal reflux disease)   Acute kidney injury superimposed on CKD (HCC)   Acute on chronic respiratory failure with hypoxia and hypercapnia (HCC)   Atrial fibrillation, chronic (HCC)   Thrombocytopenia (HCC)   Anemia   Fall at nursing home   GI bleed   Acute metabolic encephalopathy   Hyperkalemia   Hypoalbuminemia due to protein-calorie malnutrition (HCC)   Elevated brain natriuretic peptide (BNP) level   Occult GI bleeding   Acute Anemia  - multifactorial given patient on methotrexate, taking apixaban and has CKD  - s/p 2 units PRBC - Follow Hg closely.  Recheck in AM  - thrombocytopenia - transfuse 2 units platelets 11/20  Acute on chronic respiratory failure with hypoxia - respiratory status is very slow to improve - discussed with family, will monitor for another 24 hours to look for improvements - if no improvements would consider transition to full comfort measures  - continue current management - add duonebs, add solumedrol   HFpEF with acute exacerbation  - Pt responding well to IV lasix and overall net output is negative  - continue for  now - repeat CXR in AM to follow up   Intake/Output Summary (Last 24 hours) at 01/31/2021 1028 Last data filed at 01/31/2021 0900 Gross per 24 hour  Intake 1223.26 ml  Output 2850 ml  Net -1626.74 ml   Chronic atrial fibrillation - DC APIXABAN - discussed with family who are in agreement that risks outweigh potential benefits at this point - resume lower dose of cardizem 11/20 as she is having soft BPs at this time   Acute metabolic encephalopathy - CT without acute findings - slowly improving with therapy   Pancytopenia  - Pt has been taking weekly methotrexate - discussed with family, will discontinue at this time as risks outweigh benefits - platelets continue to drop now down to 17 with oral bleeding - transfuse 2 units platelets 11/20  Question of Rheumatoid Arthritis  - Pt taking weekly methotrexate for "arthritis condition" per daughter - given pancytopenia decision was made this admission with daughter to discontinue methotrexate indefinitely   Stage IV CKD  - continue to follow closely  - avoid nephrotoxic agents   DNR present on admission  - discussed goals of care with family - palliative care consultation requested for goals of care    DVT prophylaxis:  SCDs Code Status: DNR  Family Communication: daughters at bedside 11/19 Disposition: TBD  Status is: Inpatient  Remains inpatient appropriate because: IV medications used for acute care    Consultants:  Palliative care    Procedures:  N/a  Antimicrobials:     Subjective: Pt very hard of hearing, requesting sips of water  Objective: Vitals:   01/30/21 0800 01/30/21 0900 01/30/21 1145 01/30/21 1615  BP: (!) 143/45 (!) 131/108    Pulse: 89 82 88 86  Resp: (!) 23 (!) 22 (!) 23 (!) 21  Temp:   97.8 F (36.6 C) 97.6 F (36.4 C)  TempSrc:   Oral Oral  SpO2: (!) 88% 95% 100% 100%  Weight:      Height:        Intake/Output Summary (Last 24 hours) at 01/30/2021 1618 Last data filed at  01/30/2021 0948 Gross per 24 hour  Intake 2003.65 ml  Output 3825 ml  Net -1821.35 ml   Filed Weights   01/29/21 0500 01/30/21 0500  Weight: 92.9 kg 92 kg    Examination:  General exam: somnolent but arousable, very hard of hearing.    Respiratory system: diffuse rales coarse anterior upper airway congestion slightly improved only,  moderate increased work of breathing.   Cardiovascular system: normal S1 & S2 heard. No JVD, murmurs, rubs, gallops or clicks. No pedal edema. Gastrointestinal system: Abdomen is nondistended, soft and nontender. No organomegaly or masses felt. Normal bowel sounds heard. Central nervous system: Alert and oriented. No focal neurological deficits. Extremities: Symmetric 5 x 5 power. Skin: No rashes, lesions or ulcers Psychiatry: Judgement and insight appear UTD. Mood & affect UTD.   Data Reviewed: I have personally reviewed following labs and imaging studies  CBC: Recent Labs  Lab 01/28/21 1424 01/29/21 0349 01/30/21 0504  WBC 4.5 2.0* 1.5*  NEUTROABS 3.9  --   --   HGB 5.7* 6.1* 7.2*  HCT 20.8* 22.1* 24.9*  MCV 91.6 90.9 89.6  PLT 55* 31* 22*    Basic Metabolic Panel: Recent Labs  Lab 01/28/21 1424 01/29/21 0349 01/29/21 0846 01/29/21 1251 01/29/21 1657 01/30/21 0504  NA 140 142  --   --   --  144  K 5.7* 3.1* 3.3* 2.9* 2.9* 2.3*  CL 105 103  --   --   --  99  CO2 29 32  --   --   --  39*  GLUCOSE 134* 141*  --   --   --  116*  BUN 58* 51*  --   --   --  36*  CREATININE 2.06* 1.61*  --   --   --  1.13*  CALCIUM 9.8 9.3  --   --   --  9.3  MG  --  2.3  --   --   --  1.9  PHOS  --  5.2*  --   --   --  2.8    GFR: Estimated Creatinine Clearance: 35.6 mL/min (A) (by C-G formula based on SCr of 1.13 mg/dL (H)).  Liver Function Tests: Recent Labs  Lab 01/28/21 1634 01/29/21 0349 01/30/21 0504  AST 14* 12*  --   ALT 12 10  --   ALKPHOS 59 57  --   BILITOT 1.3* 1.4*  --   PROT 5.9* 5.8*  --   ALBUMIN 3.0* 2.8* 2.7*     CBG: No results for input(s): GLUCAP in the last 168 hours.  Recent Results (from the past 240 hour(s))  Expectorated Sputum Assessment w Gram Stain, Rflx to Resp Cult     Status: None   Collection Time: 01/28/21 12:53 PM   Specimen: Sputum  Result Value Ref Range Status   Specimen Description SPU  Final   Special Requests NONE  Final   Sputum evaluation   Final  THIS SPECIMEN IS ACCEPTABLE FOR SPUTUM CULTURE Performed at [facility], [address] [place], [place] [address]    Report Status 01/28/2021 FINAL  Final  Culture, Respiratory w Gram Stain     Status: None (Preliminary result)   Collection Time: 01/28/21 12:53 PM   Specimen: Sputum  Result Value Ref Range Status   Specimen Description   Final    SPU Performed at [facility], [address], [place], [place] [address]    Special Requests   Final    NONE Reflexed from [phone] Performed at [facility], [address] [place], [place] [address]    Gram Stain   Final    FEW SQUAMOUS EPITHELIAL CELLS PRESENT MODERATE WBC PRESENT,BOTH PMN AND MONONUCLEAR ABUNDANT GRAM POSITIVE COCCI FEW GRAM NEGATIVE RODS RARE GRAM POSITIVE RODS Performed at [facility], [place] [address], [place], [place] [address]    Culture PENDING  Incomplete   Report Status PENDING  Incomplete  Resp Panel by RT-PCR (Flu A&B, Covid) Nasopharyngeal Swab     Status: None   Collection Time: 01/28/21  4:30 PM   Specimen: Nasopharyngeal Swab; Nasopharyngeal(NP) swabs in vial transport medium  Result Value Ref Range Status   SARS Coronavirus 2 by RT PCR NEGATIVE NEGATIVE Final    Comment: (NOTE) SARS-CoV-2 target nucleic acids are NOT DETECTED.  The SARS-CoV-2 RNA is generally detectable in upper respiratory specimens during the acute phase of infection. The lowest concentration of SARS-CoV-2 viral copies this assay can detect is 138 copies/mL. A negative result does not preclude SARS-Cov-2 infection and should not be used as the sole  basis for treatment or other patient management decisions. A negative result may occur with  improper specimen collection/handling, submission of specimen other than nasopharyngeal swab, presence of viral mutation(s) within the areas targeted by this assay, and inadequate number of viral copies(<138 copies/mL). A negative result must be combined with clinical observations, patient history, and epidemiological information. The expected result is Negative.  Fact Sheet for Patients:  EntrepreneurPulse.com.au  Fact Sheet for Healthcare Providers:  IncredibleEmployment.be  This test is no t yet approved or cleared by the [place] FDA and  has been authorized for detection and/or diagnosis of SARS-CoV-2 by FDA under an Emergency Use Authorization (EUA). This EUA will remain  in effect (meaning this test can be used) for the duration of the COVID-19 declaration under Section 564(b)(1) of the Act, 21 U.S.C.section 360bbb-3(b)(1), unless the authorization is terminated  or revoked sooner.       Influenza A by PCR NEGATIVE NEGATIVE Final   Influenza B by PCR NEGATIVE NEGATIVE Final    Comment: (NOTE) The Xpert Xpress SARS-CoV-2/FLU/RSV plus assay is intended as an aid in the diagnosis of influenza from Nasopharyngeal swab specimens and should not be used as a sole basis for treatment. Nasal washings and aspirates are unacceptable for Xpert Xpress SARS-CoV-2/FLU/RSV testing.  Fact Sheet for Patients: EntrepreneurPulse.com.au  Fact Sheet for Healthcare Providers: IncredibleEmployment.be  This test is not yet approved or cleared by the [place] FDA and has been authorized for detection and/or diagnosis of SARS-CoV-2 by FDA under an Emergency Use Authorization (EUA). This EUA will remain in effect (meaning this test can be used) for the duration of the COVID-19 declaration under Section 564(b)(1) of the Act,  21 U.S.C. section 360bbb-3(b)(1), unless the authorization is terminated or revoked.  Performed at [facility], [address], [place], [place] [address]   MRSA Next Gen by PCR, Nasal  Status: None   Collection Time: 01/29/21  5:09 AM   Specimen: Nasal Mucosa; Nasal Swab  Result Value Ref Range Status   MRSA by PCR Next Gen NOT DETECTED NOT DETECTED Final    Comment: (NOTE) The GeneXpert MRSA Assay (FDA approved for NASAL specimens only), is one component of a comprehensive MRSA colonization surveillance program. It is not intended to diagnose MRSA infection nor to guide or monitor treatment for MRSA infections. Test performance is not FDA approved in patients less than 68 years old. Performed at [facility], [address], [place], [place] [address]      Radiology Studies: CT Head Wo Contrast  Result Date: 01/28/2021 CLINICAL DATA:  Head trauma, minor (Age >= 65y). Altered mental status. Fall. EXAM: CT HEAD WITHOUT CONTRAST TECHNIQUE: Contiguous axial images were obtained from the base of the skull through the vertex without intravenous contrast. COMPARISON:  04/10/2017 FINDINGS: Brain: There is atrophy and chronic small vessel disease changes. No acute intracranial abnormality. Specifically, no hemorrhage, hydrocephalus, mass lesion, acute infarction, or significant intracranial injury. Vascular: No hyperdense vessel or unexpected calcification. Skull: No acute calvarial abnormality. Sinuses/Orbits: No acute findings Other: None IMPRESSION: Atrophy, chronic microvascular disease. No acute intracranial abnormality. Electronically Signed   By: Rolm Baptise M.D.   On: 01/28/2021 18:21   DG Chest Portable 1 View  Result Date: 01/28/2021 CLINICAL DATA:  Cough and congestion.  Acute renal failure. EXAM: PORTABLE CHEST 1 VIEW COMPARISON:  10/26/2020 FINDINGS: Chronic cardiomegaly. Unchanged mediastinal contours. Aortic atherosclerosis. Diffuse interstitial and bronchial thickening  suspicious for pulmonary edema, worsened in the interim. Suspected small pleural effusions. Additional patchy bibasilar opacities. No pneumothorax. Degenerative change of both shoulders. No acute osseous abnormalities are seen. The bones are diffusely under mineralized. IMPRESSION: 1. Cardiomegaly with pulmonary edema and small pleural effusions consistent with CHF. 2. Patchy bibasilar opacities, atelectasis versus pneumonia. Electronically Signed   By: Keith Rake M.D.   On: 01/28/2021 18:15   DG Hand Complete Left  Result Date: 01/28/2021 CLINICAL DATA:  Fall today. EXAM: LEFT HAND - COMPLETE 3+ VIEW COMPARISON:  None. FINDINGS: No acute fracture or dislocation. The bones are diffusely under mineralized. Osteoarthritis of the digits primarily affecting the distal interphalangeal joints. Mild osteoarthritis of the thumb at the carpal metacarpal joint. No erosive change. No focal soft tissue abnormalities. IMPRESSION: 1. No acute fracture or dislocation of the left hand. 2. Osteoarthritis and osteopenia. Electronically Signed   By: Keith Rake M.D.   On: 01/28/2021 18:14   ECHOCARDIOGRAM COMPLETE  Result Date: 01/29/2021    ECHOCARDIOGRAM REPORT   Patient Name:   [name] Date of Exam: 01/29/2021 Medical Rec #:  [id number]          Height:       63.0 in Accession #:    [id number]         Weight:       204.8 lb Date of Birth:  February 10, 1929          BSA:          1.953 m Patient Age:    74 years           BP:           111/56 mmHg Patient Gender: F                  HR:           81 bpm. Exam Location:  [facility] Procedure: 2D Echo, Cardiac Doppler  and Color Doppler Indications:    CHF-Acute Diastolic  History:        Patient has prior history of Echocardiogram examinations, most                 recent 07/27/2016. CHF, COPD, Arrythmias:Atrial Fibrillation;                 Risk Factors:Hypertension, Diabetes and Dyslipidemia.  Sonographer:    DBB Referring Phys: [id number] OLADAPO ADEFESO  IMPRESSIONS  1. Left ventricular ejection fraction, by estimation, is 65 to 70%. The left ventricle has normal function. The left ventricle has no regional wall motion abnormalities. The left ventricular internal cavity size was mildly dilated. There is mild left ventricular hypertrophy. Left ventricular diastolic parameters are indeterminate.  2. Right ventricular systolic function is normal. The right ventricular size is mildly enlarged. There is moderately elevated pulmonary artery systolic pressure. The estimated right ventricular systolic pressure is 01.0 mmHg.  3. Left atrial size was severely dilated.  4. Right atrial size was severely dilated.  5. The mitral valve is abnormal. Moderate mitral valve regurgitation. Moderate mitral annular calcification.  6. Tricuspid valve regurgitation is moderate.  7. The aortic valve is tricuspid. There is moderate calcification of the aortic valve. Aortic valve regurgitation is not visualized. Moderate aortic valve stenosis. Aortic valve mean gradient measures 19.5 mmHg. Dimentionless index 0.43.  8. The inferior vena cava is dilated in size with >50% respiratory variability, suggesting right atrial pressure of 8 mmHg. Comparison(s): No prior Echocardiogram. FINDINGS  Left Ventricle: Left ventricular ejection fraction, by estimation, is 65 to 70%. The left ventricle has normal function. The left ventricle has no regional wall motion abnormalities. The left ventricular internal cavity size was mildly dilated. There is  mild left ventricular hypertrophy. Left ventricular diastolic function could not be evaluated due to atrial fibrillation. Left ventricular diastolic parameters are indeterminate. Right Ventricle: The right ventricular size is mildly enlarged. No increase in right ventricular wall thickness. Right ventricular systolic function is normal. There is moderately elevated pulmonary artery systolic pressure. The tricuspid regurgitant velocity is 3.23 m/s, and with an  assumed right atrial pressure of 8 mmHg, the estimated right ventricular systolic pressure is 27.2 mmHg. Left Atrium: Left atrial size was severely dilated. Right Atrium: Right atrial size was severely dilated. Pericardium: There is no evidence of pericardial effusion. Mitral Valve: The mitral valve is abnormal. Moderate mitral annular calcification. Moderate mitral valve regurgitation. MV peak gradient, 11.8 mmHg. The mean mitral valve gradient is 2.0 mmHg. Tricuspid Valve: The tricuspid valve is grossly normal. Tricuspid valve regurgitation is moderate. Aortic Valve: The aortic valve is tricuspid. There is moderate calcification of the aortic valve. There is mild aortic valve annular calcification. Aortic valve regurgitation is not visualized. Moderate aortic stenosis is present. Aortic valve mean gradient measures 19.5 mmHg. Aortic valve peak gradient measures 34.6 mmHg. Aortic valve area, by VTI measures 1.34 cm. Pulmonic Valve: The pulmonic valve was grossly normal. Pulmonic valve regurgitation is trivial. Aorta: The aortic root is normal in size and structure. Venous: The inferior vena cava is dilated in size with greater than 50% respiratory variability, suggesting right atrial pressure of 8 mmHg. IAS/Shunts: No atrial level shunt detected by color flow Doppler.  LEFT VENTRICLE PLAX 2D LVIDd:         5.90 cm   Diastology LVIDs:         3.40 cm   LV e' medial:    6.31 cm/s LV PW:  0.90 cm   LV E/e' medial:  23.9 LV IVS:        1.20 cm   LV e' lateral:   9.14 cm/s LVOT diam:     2.00 cm   LV E/e' lateral: 16.5 LV SV:         88 LV SV Index:   45 LVOT Area:     3.14 cm  RIGHT VENTRICLE RV Basal diam:  3.50 cm RV Mid diam:    3.60 cm RV S prime:     16.10 cm/s TAPSE (M-mode): 2.2 cm LEFT ATRIUM              Index        RIGHT ATRIUM           Index LA diam:        5.60 cm  2.87 cm/m   RA Area:     30.00 cm LA Vol (A2C):   169.0 ml 86.52 ml/m  RA Volume:   108.00 ml 55.29 ml/m LA Vol (A4C):   129.0 ml  66.04 ml/m LA Biplane Vol: 149.0 ml 76.28 ml/m  AORTIC VALVE                     PULMONIC VALVE AV Area (Vmax):    1.69 cm      PV Vmax:       1.12 m/s AV Area (Vmean):   1.45 cm      PV Peak grad:  5.0 mmHg AV Area (VTI):     1.34 cm AV Vmax:           294.00 cm/s AV Vmean:          204.500 cm/s AV VTI:            0.657 m AV Peak Grad:      34.6 mmHg AV Mean Grad:      19.5 mmHg LVOT Vmax:         158.00 cm/s LVOT Vmean:        94.400 cm/s LVOT VTI:          0.281 m LVOT/AV VTI ratio: 0.43  AORTA Ao Root diam: 2.60 cm Ao Asc diam:  3.40 cm MITRAL VALVE                TRICUSPID VALVE MV Area (PHT): 2.88 cm     TR Peak grad:   41.7 mmHg MV Area VTI:   1.93 cm     TR Vmax:        323.00 cm/s MV Peak grad:  11.8 mmHg MV Mean grad:  2.0 mmHg     SHUNTS MV Vmax:       1.72 m/s     Systemic VTI:  0.28 m MV Vmean:      66.6 cm/s    Systemic Diam: 2.00 cm MV Decel Time: 263 msec MV E velocity: 151.00 cm/s Rozann Lesches MD Electronically signed by Rozann Lesches MD Signature Date/Time: 01/29/2021/10:57:55 AM    Final     Scheduled Meds:  Chlorhexidine Gluconate Cloth  6 each Topical Daily   dextromethorphan-guaiFENesin  1 tablet Oral BID   [START ON 01/31/2021] diltiazem  120 mg Oral Daily   DULoxetine  40 mg Oral Daily   fluticasone furoate-vilanterol  1 puff Inhalation Daily   furosemide  20 mg Intravenous BID   pantoprazole (PROTONIX) IV  40 mg Intravenous Q24H   potassium chloride  40 mEq Oral BID   umeclidinium  bromide  1 puff Inhalation Daily   [START ON 01/31/2021] vitamin B-12  1,000 mcg Oral Daily   Continuous Infusions:  cefTRIAXone (ROCEPHIN)  IV 1 g (01/30/21 0827)    LOS: 2 days   Time spent: 35 minutes   Toshi Ishii Wynetta Emery, MD How to contact the [facility] Attending or Consulting provider [place] or covering provider during after hours [place], for this patient?  Check the care team in [facility] and look for a) attending/consulting [facility] provider listed and b) the [facility] team listed Log into  [email or web address] and use [place]'s universal password to access. If you do not have the password, please contact the hospital operator. Locate the [facility] provider you are looking for under [facility] and page to a number that you can be directly reached. If you still have difficulty reaching the provider, please page the [facility] (Director on Call) for the Hospitalists listed on amion for assistance.  01/30/2021, 4:18 PM

## 2021-01-30 NOTE — Progress Notes (Signed)
Had a telephone conversation with [name] (patient's daughter) regarding the passing of information from doctors/ nurses to family members who are visiting and the obtaining of any histories about the patient. [name] wants to be the only one that information is shared with and to be the only one that any questions are asked about the patient, [name].  [name]'s daughter [name] Dunn be contacted to get in touch with her mom Becky Munro if we Dunn not get in touch with [name] initially. [name] also said that we are welcome to call anytime day/ night if anything comes up involving her mother's care.

## 2021-01-30 NOTE — Progress Notes (Signed)
Family visiting and patient was asleep with oxygen saturations of 92% on 5 lpm nasal cannula. Family requested to be placed back on veni-mask which I did at the 35% flow rate.

## 2021-01-30 NOTE — Progress Notes (Signed)
Subjective:  Patient has no complaints.  According to daughter [name] who is at bedside she has been taking sips of water.  Earlier in the day she was able to swallow potassium tablet.  She did regurgitated water.  No melena or rectal bleeding reported.  Current Medications:  Current Facility-Administered Medications:    cefTRIAXone (ROCEPHIN) 1 g in sodium chloride 0.9 % 100 mL IVPB, 1 g, Intravenous, Q24H, Emokpae, Courage, MD, Last Rate: 200 mL/hr at 01/30/21 0827, 1 g at 01/30/21 0827   Chlorhexidine Gluconate Cloth 2 % PADS 6 each, 6 each, Topical, Daily, Adefeso, Oladapo, DO, 6 each at 01/29/21 0921   dextromethorphan-guaiFENesin (MUCINEX DM) 30-600 MG per 12 hr tablet 1 tablet, 1 tablet, Oral, BID, Adefeso, Oladapo, DO, 1 tablet at 01/29/21 2214   furosemide (LASIX) injection 20 mg, 20 mg, Intravenous, BID, Adefeso, Oladapo, DO, 20 mg at 01/30/21 0747   pantoprazole (PROTONIX) injection 40 mg, 40 mg, Intravenous, Q24H, Adefeso, Oladapo, DO, 40 mg at 01/30/21 0001   potassium chloride SA (KLOR-CON) CR tablet 40 mEq, 40 mEq, Oral, BID, Johnson, Clanford L, MD, 40 mEq at 01/30/21 0829   Objective: Blood pressure (!) 131/108, pulse 88, temperature 97.8 F (36.6 C), temperature source Oral, resp. rate (!) 23, height $RemoveBe'5\' 3"'[id number]$  (1.6 m), weight 92 kg, SpO2 100 %. Patient is alert and responds appropriate to simple questions. She is pale. Abdomen is full but soft and nontender with organomegaly or masses. She has extensive bruising to both upper extremities.  Labs/studies Results:   CBC Latest Ref Rng & Units 01/30/2021 01/29/2021 01/28/2021  WBC 4.0 - 10.5 K/uL 1.5(L) 2.0(L) 4.5  Hemoglobin 12.0 - 15.0 g/dL 7.2(L) 6.1(LL) 5.7(LL)  Hematocrit 36.0 - 46.0 % 24.9(L) 22.1(L) 20.8(L)  Platelets 150 - 400 K/uL 22(LL) 31(L) 55(L)    CMP Latest Ref Rng & Units 01/30/2021 01/29/2021 01/29/2021  Glucose 70 - 99 mg/dL 116(H) - -  BUN 8 - 23 mg/dL 36(H) - -  Creatinine 0.44 - 1.00 mg/dL 1.13(H) -  -  Sodium 135 - 145 mmol/L 144 - -  Potassium 3.5 - 5.1 mmol/L 2.3(LL) 2.9(L) 2.9(L)  Chloride 98 - 111 mmol/L 99 - -  CO2 22 - 32 mmol/L 39(H) - -  Calcium 8.9 - 10.3 mg/dL 9.3 - -  Total Protein 6.5 - 8.1 g/dL - - -  Total Bilirubin 0.3 - 1.2 mg/dL - - -  Alkaline Phos 38 - 126 U/L - - -  AST 15 - 41 U/L - - -  ALT 0 - 44 U/L - - -    Hepatic Function Latest Ref Rng & Units 01/30/2021 01/29/2021 01/28/2021  Total Protein 6.5 - 8.1 g/dL - 5.8(L) 5.9(L)  Albumin 3.5 - 5.0 g/dL 2.7(L) 2.8(L) 3.0(L)  AST 15 - 41 U/L - 12(L) 14(L)  ALT 0 - 44 U/L - 10 12  Alk Phosphatase 38 - 126 U/L - 57 59  Total Bilirubin 0.3 - 1.2 mg/dL - 1.4(H) 1.3(H)  Bilirubin, Direct 0.0 - 0.2 mg/dL - - 0.4(H)      Assessment:  #1.  GI bleed.  I wonder if heme positive stool is secondary to swallowed blood because she did have tongue bite.  She has a history of peptic ulcer disease with remote surgery and she is being treated like she may have peptic ulcer disease.  No indication for endoscopic intervention.  #2.  Pancytopenia.  She has critically low platelet count and WBCs.  Methotrexate is not listed as one  of her medications.  Last dose was 01/24/2021(nursing staff nice enough to call and set her to confirm this).  #3.  History of fibromyalgia.  Patient is on prednisone.  Dose unclear.  I suspect she has adrenal suppression and needs to be on steroid.  We will leave the dose up to Dr. Wynetta Emery.  #4.  History of atrial fibrillation.  Eliquis is on hold.  Given history of falling episodes  need for anticoagulation should be reevaluated.   Recommendations  Avoid doxycycline while oral route as there is risk of esophagitis if medicine stays in her esophagus. CBC with differential in AM.  Please note patient's condition discussed with the daughter Ms. Catarina Hartshorn earlier today.

## 2021-01-31 DIAGNOSIS — D649 Anemia, unspecified: Secondary | ICD-10-CM | POA: Diagnosis not present

## 2021-01-31 DIAGNOSIS — G9341 Metabolic encephalopathy: Secondary | ICD-10-CM | POA: Diagnosis not present

## 2021-01-31 DIAGNOSIS — R195 Other fecal abnormalities: Secondary | ICD-10-CM | POA: Diagnosis not present

## 2021-01-31 DIAGNOSIS — I509 Heart failure, unspecified: Secondary | ICD-10-CM | POA: Diagnosis not present

## 2021-01-31 DIAGNOSIS — N179 Acute kidney failure, unspecified: Secondary | ICD-10-CM | POA: Diagnosis not present

## 2021-01-31 DIAGNOSIS — I482 Chronic atrial fibrillation, unspecified: Secondary | ICD-10-CM | POA: Diagnosis not present

## 2021-01-31 DIAGNOSIS — K922 Gastrointestinal hemorrhage, unspecified: Secondary | ICD-10-CM | POA: Diagnosis not present

## 2021-01-31 DIAGNOSIS — W19XXXD Unspecified fall, subsequent encounter: Secondary | ICD-10-CM

## 2021-01-31 LAB — TYPE AND SCREEN
ABO/RH(D): O POS
Antibody Screen: NEGATIVE
Unit division: 0
Unit division: 0

## 2021-01-31 LAB — BPAM RBC
Blood Product Expiration Date: 202212182359
Blood Product Expiration Date: 202212222359
ISSUE DATE / TIME: 202211171826
ISSUE DATE / TIME: 202211182051
Unit Type and Rh: 5100
Unit Type and Rh: 5100

## 2021-01-31 LAB — BASIC METABOLIC PANEL
Anion gap: 5 (ref 5–15)
BUN: 27 mg/dL — ABNORMAL HIGH (ref 8–23)
CO2: 40 mmol/L — ABNORMAL HIGH (ref 22–32)
Calcium: 9.6 mg/dL (ref 8.9–10.3)
Chloride: 100 mmol/L (ref 98–111)
Creatinine, Ser: 0.97 mg/dL (ref 0.44–1.00)
GFR, Estimated: 56 mL/min — ABNORMAL LOW (ref >60)
Glucose, Bld: 135 mg/dL — ABNORMAL HIGH (ref 70–99)
Potassium: 3.4 mmol/L — ABNORMAL LOW (ref 3.5–5.1)
Sodium: 145 mmol/L (ref 135–145)

## 2021-01-31 LAB — CBC
HCT: 29.3 % — ABNORMAL LOW (ref 36.0–46.0)
Hemoglobin: 8.3 g/dL — ABNORMAL LOW (ref 12.0–15.0)
MCH: 26.2 pg (ref 26.0–34.0)
MCHC: 28.3 g/dL — ABNORMAL LOW (ref 30.0–36.0)
MCV: 92.4 fL (ref 80.0–100.0)
Platelets: 17 10*3/uL — CL (ref 150–400)
RBC: 3.17 MIL/uL — ABNORMAL LOW (ref 3.87–5.11)
RDW: 22.7 % — ABNORMAL HIGH (ref 11.5–15.5)
WBC: 2.7 10*3/uL — ABNORMAL LOW (ref 4.0–10.5)
nRBC: 0 % (ref 0.0–0.2)

## 2021-01-31 MED ORDER — IPRATROPIUM-ALBUTEROL 0.5-2.5 (3) MG/3ML IN SOLN
3.0000 mL | Freq: Three times a day (TID) | RESPIRATORY_TRACT | Status: DC
  Administered 2021-02-01 – 2021-02-03 (×8): 3 mL via RESPIRATORY_TRACT
  Filled 2021-01-31 (×8): qty 3

## 2021-01-31 MED ORDER — METHYLPREDNISOLONE SODIUM SUCC 40 MG IJ SOLR
40.0000 mg | Freq: Two times a day (BID) | INTRAMUSCULAR | Status: DC
  Administered 2021-01-31 – 2021-02-01 (×3): 40 mg via INTRAVENOUS
  Filled 2021-01-31 (×3): qty 1

## 2021-01-31 MED ORDER — PANTOPRAZOLE SODIUM 40 MG PO TBEC
40.0000 mg | DELAYED_RELEASE_TABLET | Freq: Every day | ORAL | Status: DC
  Administered 2021-02-01 – 2021-02-02 (×2): 40 mg via ORAL
  Filled 2021-01-31 (×2): qty 1

## 2021-01-31 MED ORDER — SODIUM CHLORIDE 0.9% IV SOLUTION: Freq: Once | INTRAVENOUS | Status: DC

## 2021-01-31 NOTE — Progress Notes (Signed)
Notified Dr. Wynetta Emery of critical platelet count of 17

## 2021-01-31 NOTE — Progress Notes (Signed)
PROGRESS NOTE   [name]  OMV:[id number] DOB: 27-Jun-1928 DOA: 01/28/2021 PCP: Gerlene Fee, NP   Chief Complaint  Patient presents with   Altered Mental Status   Level of care: Telemetry  Brief Admission History:  87 y.o. female with medical history significant for chronic respiratory failure on supplemental oxygen at 2.5 LPM, CKD stage III, rheumatoid arthritis, A. fib on Eliquis, GERD, COPD admitted on 01/28/2021 with acute metabolic cephalopathy secondary to acute on chronic respiratory failure with hypoxia and hypercapnia in the setting of CHF and COPD exacerbation, compounded by acute on chronic anemia with concern for acute GI bleed.    Assessment & Plan:   Principal Problem:   CHF (congestive heart failure) (HCC) Active Problems:   COPD (chronic obstructive pulmonary disease) (HCC)   GERD (gastroesophageal reflux disease)   Acute kidney injury superimposed on CKD (HCC)   Acute on chronic respiratory failure with hypoxia and hypercapnia (HCC)   Atrial fibrillation, chronic (HCC)   Thrombocytopenia (HCC)   Anemia   Fall at nursing home   GI bleed   Acute metabolic encephalopathy   Hyperkalemia   Hypoalbuminemia due to protein-calorie malnutrition (HCC)   Elevated brain natriuretic peptide (BNP) level   Occult GI bleeding   Acute Anemia  - multifactorial given patient on methotrexate, taking apixaban and has CKD  - s/p 2 units PRBC - Follow Hg closely.  Recheck in AM  - thrombocytopenia - transfuse 2 units platelets 11/20  Acute on chronic respiratory failure with hypoxia - respiratory status is very slow to improve - discussed with family, will monitor for another 24 hours to look for improvements - if no improvements would consider transition to full comfort measures  - continue current management - add duonebs, add solumedrol   HFpEF with acute exacerbation  - Pt responding well to IV lasix and overall net output is negative  - continue for  now - repeat CXR in AM to follow up   Intake/Output Summary (Last 24 hours) at 01/31/2021 1821 Last data filed at 01/31/2021 1700 Gross per 24 hour  Intake 1854 ml  Output 2050 ml  Net -196 ml   Chronic atrial fibrillation - DC APIXABAN - discussed with family who are in agreement that risks outweigh potential benefits at this point - resume lower dose of cardizem 11/20 as she is having soft BPs at this time   Acute metabolic encephalopathy - CT without acute findings - slowly improving with therapy   Pancytopenia  - Pt has been taking weekly methotrexate - discussed with family, will discontinue at this time as risks outweigh benefits - platelets continue to drop now down to 17 with oral bleeding - transfuse 2 units platelets 11/20  Question of Rheumatoid Arthritis  - Pt taking weekly methotrexate for "arthritis condition" per daughter - given pancytopenia decision was made this admission with daughter to discontinue methotrexate indefinitely   Stage IV CKD  - continue to follow closely  - avoid nephrotoxic agents   DNR present on admission  - discussed goals of care with family - palliative care consultation requested for goals of care    DVT prophylaxis:  SCDs Code Status: DNR  Family Communication: daughters at bedside 11/19 Disposition: TBD  Status is: Inpatient  Remains inpatient appropriate because: IV medications used for acute care    Consultants:  Palliative care    Procedures:  N/a  Antimicrobials:     Subjective: Coarse breath sounds but more awake today.  Objective: Vitals:   01/31/21 1400 01/31/21 1421 01/31/21 1454 01/31/21 1549  BP: 128/73  (!) 136/57   Pulse: 97  94 91  Resp: (!) 22  (!) 24 (!) 22  Temp:   98 F (36.7 C) 97.9 F (36.6 C)  TempSrc:   Axillary Oral  SpO2: 99% 92% 97% 100%  Weight:      Height:        Intake/Output Summary (Last 24 hours) at 01/31/2021 1821 Last data filed at 01/31/2021 1700 Gross per 24 hour   Intake 1854 ml  Output 2050 ml  Net -196 ml   Filed Weights   01/29/21 0500 01/30/21 0500 01/31/21 0500  Weight: 92.9 kg 92 kg 91.2 kg    Examination:  General exam: somnolent but arousable, very hard of hearing.    Respiratory system: diffuse rales coarse anterior upper airway congestion slightly improved only,  moderate increased work of breathing.   Cardiovascular system: normal S1 & S2 heard. No JVD, murmurs, rubs, gallops or clicks. No pedal edema. Gastrointestinal system: Abdomen is nondistended, soft and nontender. No organomegaly or masses felt. Normal bowel sounds heard. Central nervous system: Alert and oriented. No focal neurological deficits. Extremities: Symmetric 5 x 5 power. Skin: No rashes, lesions or ulcers Psychiatry: Judgement and insight appear UTD. Mood & affect UTD.   Data Reviewed: I have personally reviewed following labs and imaging studies  CBC: Recent Labs  Lab 01/28/21 1424 01/29/21 0349 01/30/21 0504 01/31/21 0729  WBC 4.5 2.0* 1.5* 2.7*  NEUTROABS 3.9  --   --   --   HGB 5.7* 6.1* 7.2* 8.3*  HCT 20.8* 22.1* 24.9* 29.3*  MCV 91.6 90.9 89.6 92.4  PLT 55* 31* 22* 17*    Basic Metabolic Panel: Recent Labs  Lab 01/28/21 1424 01/29/21 0349 01/29/21 0846 01/29/21 1251 01/29/21 1657 01/30/21 0504 01/31/21 0729  NA 140 142  --   --   --  144 145  K 5.7* 3.1* 3.3* 2.9* 2.9* 2.3* 3.4*  CL 105 103  --   --   --  99 100  CO2 29 32  --   --   --  39* 40*  GLUCOSE 134* 141*  --   --   --  116* 135*  BUN 58* 51*  --   --   --  36* 27*  CREATININE 2.06* 1.61*  --   --   --  1.13* 0.97  CALCIUM 9.8 9.3  --   --   --  9.3 9.6  MG  --  2.3  --   --   --  1.9  --   PHOS  --  5.2*  --   --   --  2.8  --     GFR: Estimated Creatinine Clearance: 41.3 mL/min (by C-G formula based on SCr of 0.97 mg/dL).  Liver Function Tests: Recent Labs  Lab 01/28/21 1634 01/29/21 0349 01/30/21 0504  AST 14* 12*  --   ALT 12 10  --   ALKPHOS 59 57  --    BILITOT 1.3* 1.4*  --   PROT 5.9* 5.8*  --   ALBUMIN 3.0* 2.8* 2.7*    CBG: No results for input(s): GLUCAP in the last 168 hours.  Recent Results (from the past 240 hour(s))  Expectorated Sputum Assessment w Gram Stain, Rflx to Resp Cult     Status: None   Collection Time: 01/28/21 12:53 PM   Specimen: Sputum  Result Value Ref Range  Status   Specimen Description SPU  Final   Special Requests NONE  Final   Sputum evaluation   Final    THIS SPECIMEN IS ACCEPTABLE FOR SPUTUM CULTURE Performed at [facility], [address] [place], [place] [address]    Report Status 01/28/2021 FINAL  Final  Culture, Respiratory w Gram Stain     Status: None (Preliminary result)   Collection Time: 01/28/21 12:53 PM   Specimen: Sputum  Result Value Ref Range Status   Specimen Description   Final    SPU Performed at [facility], [address] [place], [place] [address]    Special Requests   Final    NONE Reflexed from [id number] Performed at [facility], [address], [place], [place] [address]    Gram Stain   Final    FEW SQUAMOUS EPITHELIAL CELLS PRESENT MODERATE WBC PRESENT,BOTH PMN AND MONONUCLEAR ABUNDANT GRAM POSITIVE COCCI FEW GRAM NEGATIVE RODS RARE GRAM POSITIVE RODS    Culture   Final    CULTURE REINCUBATED FOR BETTER GROWTH Performed at [facility], [place] [address], [place], [place] [address]    Report Status PENDING  Incomplete  Resp Panel by RT-PCR (Flu A&B, Covid) Nasopharyngeal Swab     Status: None   Collection Time: 01/28/21  4:30 PM   Specimen: Nasopharyngeal Swab; Nasopharyngeal(NP) swabs in vial transport medium  Result Value Ref Range Status   SARS Coronavirus 2 by RT PCR NEGATIVE NEGATIVE Final    Comment: (NOTE) SARS-CoV-2 target nucleic acids are NOT DETECTED.  The SARS-CoV-2 RNA is generally detectable in upper respiratory specimens during the acute phase of infection. The lowest concentration of SARS-CoV-2 viral copies this assay can detect is 138  copies/mL. A negative result does not preclude SARS-Cov-2 infection and should not be used as the sole basis for treatment or other patient management decisions. A negative result may occur with  improper specimen collection/handling, submission of specimen other than nasopharyngeal swab, presence of viral mutation(s) within the areas targeted by this assay, and inadequate number of viral copies(<138 copies/mL). A negative result must be combined with clinical observations, patient history, and epidemiological information. The expected result is Negative.  Fact Sheet for Patients:  EntrepreneurPulse.com.au  Fact Sheet for Healthcare Providers:  IncredibleEmployment.be  This test is no t yet approved or cleared by the [place] FDA and  has been authorized for detection and/or diagnosis of SARS-CoV-2 by FDA under an Emergency Use Authorization (EUA). This EUA will remain  in effect (meaning this test can be used) for the duration of the COVID-19 declaration under Section 564(b)(1) of the Act, 21 U.S.C.section 360bbb-3(b)(1), unless the authorization is terminated  or revoked sooner.       Influenza A by PCR NEGATIVE NEGATIVE Final   Influenza B by PCR NEGATIVE NEGATIVE Final    Comment: (NOTE) The Xpert Xpress SARS-CoV-2/FLU/RSV plus assay is intended as an aid in the diagnosis of influenza from Nasopharyngeal swab specimens and should not be used as a sole basis for treatment. Nasal washings and aspirates are unacceptable for Xpert Xpress SARS-CoV-2/FLU/RSV testing.  Fact Sheet for Patients: EntrepreneurPulse.com.au  Fact Sheet for Healthcare Providers: IncredibleEmployment.be  This test is not yet approved or cleared by the [place] FDA and has been authorized for detection and/or diagnosis of SARS-CoV-2 by FDA under an Emergency Use Authorization (EUA). This EUA will remain in effect (meaning  this test can be used) for the duration of the COVID-19 declaration under Section 564(b)(1) of  the Act, 21 U.S.C. section 360bbb-3(b)(1), unless the authorization is terminated or revoked.  Performed at [facility], [address] [place], [place] [address]   MRSA Next Gen by PCR, Nasal     Status: None   Collection Time: 01/29/21  5:09 AM   Specimen: Nasal Mucosa; Nasal Swab  Result Value Ref Range Status   MRSA by PCR Next Gen NOT DETECTED NOT DETECTED Final    Comment: (NOTE) The GeneXpert MRSA Assay (FDA approved for NASAL specimens only), is one component of a comprehensive MRSA colonization surveillance program. It is not intended to diagnose MRSA infection nor to guide or monitor treatment for MRSA infections. Test performance is not FDA approved in patients less than 61 years old. Performed at [facility], [address] [place], [place] [address]      Radiology Studies: [facility] Chest [facility] 1 View  Result Date: 01/30/2021 CLINICAL DATA:  Dyspnea EXAM: PORTABLE CHEST 1 VIEW COMPARISON:  01/28/2021 FINDINGS: Single frontal view of the chest demonstrates a stable cardiac silhouette. Central vascular congestion with diffuse interstitial prominence, bibasilar consolidation, bilateral effusions unchanged, compatible with congestive heart failure. No pneumothorax. No acute bony abnormalities. IMPRESSION: 1. Stable congestive heart failure. Electronically Signed   By: Randa Ngo M.D.   On: 01/30/2021 17:57    Scheduled Meds:  sodium chloride   Intravenous Once   Chlorhexidine Gluconate Cloth  6 each Topical Daily   dextromethorphan-guaiFENesin  1 tablet Oral BID   diltiazem  120 mg Oral Daily   DULoxetine  40 mg Oral Daily   feeding supplement  237 mL Oral BID BM   fluticasone furoate-vilanterol  1 puff Inhalation Daily   furosemide  20 mg Intravenous BID   ipratropium-albuterol  3 mL Nebulization Q6H   methylPREDNISolone (SOLU-MEDROL) injection  40 mg Intravenous Q12H   [START  ON 02/01/2021] pantoprazole  40 mg Oral QAC breakfast   umeclidinium bromide  1 puff Inhalation Daily   vitamin B-12  1,000 mcg Oral Daily   Continuous Infusions:   LOS: 3 days   Time spent: 35 minutes   Cadarius Nevares Wynetta Emery, MD How to contact the [facility] Attending or Consulting provider [place] or covering provider during after hours [place], for this patient?  Check the care team in [facility] and look for a) attending/consulting [facility] provider listed and b) the [facility] team listed Log into [email or web address] and use [place]'s universal password to access. If you do not have the password, please contact the hospital operator. Locate the [facility] provider you are looking for under [facility] and page to a number that you can be directly reached. If you still have difficulty reaching the provider, please page the [facility] (Director on Call) for the Hospitalists listed on amion for assistance.  01/31/2021, 6:21 PM

## 2021-01-31 NOTE — Progress Notes (Signed)
Subjective:  According to nursing staff patient has been coughing.  Sputum is blood-tinged.  She has not had melena or rectal bleeding.  She also has not experienced hematemesis.  Current Medications:  Current Facility-Administered Medications:    0.9 %  sodium chloride infusion (Manually program via Guardrails IV Fluids), , Intravenous, Once, Johnson, Clanford L, MD   Chlorhexidine Gluconate Cloth 2 % PADS 6 each, 6 each, Topical, Daily, Adefeso, Oladapo, DO, 6 each at 01/30/21 2007   dextromethorphan-guaiFENesin ([place] DM) 30-600 MG per 12 hr tablet 1 tablet, 1 tablet, Oral, BID, Adefeso, Oladapo, DO, 1 tablet at 01/31/21 1018   diltiazem (CARDIZEM CD) 24 hr capsule 120 mg, 120 mg, Oral, Daily, Johnson, Clanford L, MD, 120 mg at 01/31/21 1019   DULoxetine (CYMBALTA) DR capsule 40 mg, 40 mg, Oral, Daily, Johnson, Clanford L, MD, 40 mg at 01/31/21 1018   feeding supplement (ENSURE ENLIVE / ENSURE PLUS) liquid 237 mL, 237 mL, Oral, BID BM, Johnson, Clanford L, MD, 237 mL at 01/30/21 1651   fluticasone furoate-vilanterol (BREO ELLIPTA) 100-25 MCG/ACT 1 puff, 1 puff, Inhalation, Daily, Johnson, Clanford L, MD, 1 puff at 01/31/21 0845   furosemide (LASIX) injection 20 mg, 20 mg, Intravenous, BID, Adefeso, Oladapo, DO, 20 mg at 01/31/21 0814   ipratropium-albuterol (DUONEB) 0.5-2.5 (3) MG/3ML nebulizer solution 3 mL, 3 mL, Nebulization, Q4H PRN, Johnson, Clanford L, MD   ipratropium-albuterol (DUONEB) 0.5-2.5 (3) MG/3ML nebulizer solution 3 mL, 3 mL, Nebulization, Q6H, Johnson, Clanford L, MD, 3 mL at 01/31/21 0815   methylPREDNISolone sodium succinate (SOLU-MEDROL) 40 mg/mL injection 40 mg, 40 mg, Intravenous, Q12H, Johnson, Clanford L, MD   pantoprazole (PROTONIX) injection 40 mg, 40 mg, Intravenous, Q24H, Adefeso, Oladapo, DO, 40 mg at 01/31/21 0027   traMADol (ULTRAM) tablet 25 mg, 25 mg, Oral, QHS PRN, Johnson, Clanford L, MD   umeclidinium bromide (INCRUSE ELLIPTA) 62.5 MCG/ACT 1 puff, 1  puff, Inhalation, Daily, Johnson, Clanford L, MD, 1 puff at 01/31/21 0845   vitamin B-12 (CYANOCOBALAMIN) tablet 1,000 mcg, 1,000 mcg, Oral, Daily, Johnson, Clanford L, MD, 1,000 mcg at 01/31/21 1020   Objective: Blood pressure (!) 99/38, pulse 91, temperature 98.5 F (36.9 C), temperature source Oral, resp. rate (!) 22, height 5' 3" (1.6 m), weight 91.2 kg, SpO2 96 %. Patient is alert and is being fed by her daughter.  She manifests no difficulty in swallowing.   Labs/studies Results:   CBC Latest Ref Rng & Units 01/31/2021 01/30/2021 01/29/2021  WBC 4.0 - 10.5 K/uL 2.7(L) 1.5(L) 2.0(L)  Hemoglobin 12.0 - 15.0 g/dL 8.3(L) 7.2(L) 6.1(LL)  Hematocrit 36.0 - 46.0 % 29.3(L) 24.9(L) 22.1(L)  Platelets 150 - 400 K/uL 17(LL) 22(LL) 31(L)    CMP Latest Ref Rng & Units 01/31/2021 01/30/2021 01/29/2021  Glucose 70 - 99 mg/dL 135(H) 116(H) -  BUN 8 - 23 mg/dL 27(H) 36(H) -  Creatinine 0.44 - 1.00 mg/dL 0.97 1.13(H) -  Sodium 135 - 145 mmol/L 145 144 -  Potassium 3.5 - 5.1 mmol/L 3.4(L) 2.3(LL) 2.9(L)  Chloride 98 - 111 mmol/L 100 99 -  CO2 22 - 32 mmol/L 40(H) 39(H) -  Calcium 8.9 - 10.3 mg/dL 9.6 9.3 -  Total Protein 6.5 - 8.1 g/dL - - -  Total Bilirubin 0.3 - 1.2 mg/dL - - -  Alkaline Phos 38 - 126 U/L - - -  AST 15 - 41 U/L - - -  ALT 0 - 44 U/L - - -    Hepatic Function Latest Ref  Rng & Units 01/30/2021 01/29/2021 01/28/2021  Total Protein 6.5 - 8.1 g/dL - 5.8(L) 5.9(L)  Albumin 3.5 - 5.0 g/dL 2.7(L) 2.8(L) 3.0(L)  AST 15 - 41 U/L - 12(L) 14(L)  ALT 0 - 44 U/L - 10 12  Alk Phosphatase 38 - 126 U/L - 57 59  Total Bilirubin 0.3 - 1.2 mg/dL - 1.4(H) 1.3(H)  Bilirubin, Direct 0.0 - 0.2 mg/dL - - 0.4(H)      Assessment:  #1.  Pancytopenia most likely secondary to methotrexate.  Leukopenia has improved but platelet count has drifted down.  She is to receive platelet transfusion today.  H&H is coming up.  #2.  Heme positive stool.  She has hemoptysis.  Suspect this may be due to  swallowed blood.  She has remote history of peptic ulcer disease with surgery.  No indication for any endoscopic intervention.  #3.  Patient is on methotrexate for fibromyalgia.  She has never been diagnosed with rheumatoid arthritis.  If she has to go back on this medication dose has to be much smaller and she will need to be followed closely.  #4.  Atrial fibrillation.  Anticoagulation on hold.  It does not appear to me that patient is a candidate for anticoagulation because of history of falls.  Recommendations  Change pantoprazole to oral route. Will sign off.

## 2021-02-01 ENCOUNTER — Encounter (HOSPITAL_COMMUNITY): Payer: Self-pay | Admitting: Internal Medicine

## 2021-02-01 ENCOUNTER — Inpatient Hospital Stay (HOSPITAL_COMMUNITY): Payer: Medicare Other

## 2021-02-01 DIAGNOSIS — Z515 Encounter for palliative care: Secondary | ICD-10-CM

## 2021-02-01 DIAGNOSIS — L8990 Pressure ulcer of unspecified site, unspecified stage: Secondary | ICD-10-CM | POA: Insufficient documentation

## 2021-02-01 DIAGNOSIS — Z7189 Other specified counseling: Secondary | ICD-10-CM

## 2021-02-01 LAB — CBC WITH DIFFERENTIAL/PLATELET
Basophils Absolute: 0 10*3/uL (ref 0.0–0.1)
Basophils Relative: 0 %
Eosinophils Absolute: 0 10*3/uL (ref 0.0–0.5)
Eosinophils Relative: 0 %
HCT: 22.6 % — ABNORMAL LOW (ref 36.0–46.0)
Hemoglobin: 6.6 g/dL — CL (ref 12.0–15.0)
Lymphocytes Relative: 26 %
Lymphs Abs: 0.6 10*3/uL — ABNORMAL LOW (ref 0.7–4.0)
MCH: 26.4 pg (ref 26.0–34.0)
MCHC: 29.2 g/dL — ABNORMAL LOW (ref 30.0–36.0)
MCV: 90.4 fL (ref 80.0–100.0)
Monocytes Absolute: 0 10*3/uL — ABNORMAL LOW (ref 0.1–1.0)
Monocytes Relative: 1 %
Neutro Abs: 1.7 10*3/uL (ref 1.7–7.7)
Neutrophils Relative %: 73 %
Platelets: 24 10*3/uL — CL (ref 150–400)
RBC: 2.5 MIL/uL — ABNORMAL LOW (ref 3.87–5.11)
RDW: 22.5 % — ABNORMAL HIGH (ref 11.5–15.5)
WBC: 2.3 10*3/uL — ABNORMAL LOW (ref 4.0–10.5)
nRBC: 0 % (ref 0.0–0.2)

## 2021-02-01 LAB — BPAM PLATELET PHERESIS
Blood Product Expiration Date: 202211222359
Blood Product Expiration Date: 202211222359
ISSUE DATE / TIME: 202211201037
ISSUE DATE / TIME: 202211201239
Unit Type and Rh: 5100
Unit Type and Rh: 6200

## 2021-02-01 LAB — HEMOGLOBIN AND HEMATOCRIT, BLOOD
HCT: 22.8 % — ABNORMAL LOW (ref 36.0–46.0)
Hemoglobin: 6.7 g/dL — CL (ref 12.0–15.0)

## 2021-02-01 LAB — BASIC METABOLIC PANEL
Anion gap: 4 — ABNORMAL LOW (ref 5–15)
BUN: 28 mg/dL — ABNORMAL HIGH (ref 8–23)
CO2: 40 mmol/L — ABNORMAL HIGH (ref 22–32)
Calcium: 9.7 mg/dL (ref 8.9–10.3)
Chloride: 94 mmol/L — ABNORMAL LOW (ref 98–111)
Creatinine, Ser: 0.87 mg/dL (ref 0.44–1.00)
GFR, Estimated: >60 mL/min (ref >60)
Glucose, Bld: 170 mg/dL — ABNORMAL HIGH (ref 70–99)
Potassium: 3.3 mmol/L — ABNORMAL LOW (ref 3.5–5.1)
Sodium: 138 mmol/L (ref 135–145)

## 2021-02-01 LAB — CULTURE, RESPIRATORY W GRAM STAIN: Culture: NORMAL

## 2021-02-01 LAB — PREPARE RBC (CROSSMATCH)

## 2021-02-01 LAB — PREPARE PLATELET PHERESIS
Unit division: 0
Unit division: 0

## 2021-02-01 LAB — MAGNESIUM: Magnesium: 1.9 mg/dL (ref 1.7–2.4)

## 2021-02-01 MED ORDER — ACETAMINOPHEN 325 MG PO TABS
650.0000 mg | ORAL_TABLET | Freq: Four times a day (QID) | ORAL | Status: DC | PRN
  Administered 2021-02-01: 650 mg via ORAL
  Filled 2021-02-01: qty 2

## 2021-02-01 MED ORDER — BIOTENE DRY MOUTH MT LIQD: 15.0000 mL | OROMUCOSAL | Status: DC | PRN

## 2021-02-01 MED ORDER — FUROSEMIDE 10 MG/ML IJ SOLN
30.0000 mg | Freq: Two times a day (BID) | INTRAMUSCULAR | Status: DC
  Administered 2021-02-01 – 2021-02-03 (×5): 30 mg via INTRAVENOUS
  Filled 2021-02-01 (×4): qty 4

## 2021-02-01 MED ORDER — POTASSIUM CHLORIDE 20 MEQ PO PACK
40.0000 meq | PACK | Freq: Two times a day (BID) | ORAL | Status: DC
  Administered 2021-02-01 – 2021-02-02 (×3): 40 meq via ORAL
  Filled 2021-02-01 (×3): qty 2

## 2021-02-01 MED ORDER — SODIUM CHLORIDE 0.9% IV SOLUTION: Freq: Once | INTRAVENOUS | Status: AC

## 2021-02-01 MED ORDER — METHYLPREDNISOLONE SODIUM SUCC 40 MG IJ SOLR
40.0000 mg | Freq: Every day | INTRAMUSCULAR | Status: DC
  Administered 2021-02-02: 40 mg via INTRAVENOUS
  Filled 2021-02-01: qty 1

## 2021-02-01 MED ORDER — PHENOL 1.4 % MT LIQD
2.0000 | OROMUCOSAL | Status: DC | PRN
  Administered 2021-02-01: 2 via OROMUCOSAL
  Filled 2021-02-01: qty 177

## 2021-02-01 MED ORDER — FUROSEMIDE 10 MG/ML IJ SOLN
20.0000 mg | Freq: Once | INTRAMUSCULAR | Status: AC
  Administered 2021-02-01: 20 mg via INTRAVENOUS

## 2021-02-01 MED ORDER — TRAMADOL HCL 50 MG PO TABS: 25.0000 mg | ORAL_TABLET | Freq: Four times a day (QID) | ORAL | Status: DC | PRN

## 2021-02-01 NOTE — Care Management Important Message (Signed)
Important Message  Patient Details  Name: [name] MRN: [id number] Date of Birth: 04-Dec-1928   Medicare Important Message Given:  Yes     Tommy Medal 02/01/2021, 11:14 AM

## 2021-02-01 NOTE — Progress Notes (Signed)
PROGRESS NOTE   [name]  ZOX:[id number] DOB: September 17, 1928 DOA: 01/28/2021 PCP: Gerlene Fee, NP   Chief Complaint  Patient presents with   Altered Mental Status   Level of care: Telemetry  Brief Admission History:  87 y.o. female with medical history significant for chronic respiratory failure on supplemental oxygen at 2.5 LPM, CKD stage III, rheumatoid arthritis, A. fib on Eliquis, GERD, COPD admitted on 01/28/2021 with acute metabolic cephalopathy secondary to acute on chronic respiratory failure with hypoxia and hypercapnia in the setting of CHF and COPD exacerbation, compounded by acute on chronic anemia with concern for acute GI bleed.    Assessment & Plan:   Principal Problem:   CHF (congestive heart failure) (HCC) Active Problems:   COPD (chronic obstructive pulmonary disease) (HCC)   GERD (gastroesophageal reflux disease)   Acute kidney injury superimposed on CKD (HCC)   Acute on chronic respiratory failure with hypoxia and hypercapnia (HCC)   Atrial fibrillation, chronic (HCC)   Thrombocytopenia (HCC)   Anemia   Fall at nursing home   GI bleed   Acute metabolic encephalopathy   Hyperkalemia   Hypoalbuminemia due to protein-calorie malnutrition (HCC)   Elevated brain natriuretic peptide (BNP) level   Occult GI bleeding   Pressure injury of skin   Acute Anemia  - multifactorial given patient on methotrexate, taking apixaban and has CKD  - s/p 2 units PRBC - Follow Hg closely.  Recheck in AM  - thrombocytopenia - transfused 2 units platelets 11/20 - Hg down to 6.6.  Transfuse another 1 unit PRBC 11/21  Acute on chronic respiratory failure with hypoxia - respiratory status is very slow to improve - discussed with family, will monitor for another 24 hours to look for improvements - if no improvements would consider transition to full comfort measures  - continue current management - add duonebs, add solumedrol  - ongoing palliative conversation with  family   HFpEF with acute exacerbation  - Pt is on IV lasix and overall net output is negative  - continue for now - repeat CXR with worsening edema  - increased lasix to 30 mg BID   Intake/Output Summary (Last 24 hours) at 02/01/2021 1514 Last data filed at 02/01/2021 1336 Gross per 24 hour  Intake 1094 ml  Output 1401 ml  Net -307 ml   Chronic atrial fibrillation - DC APIXABAN - discussed with family who are in agreement that risks outweigh potential benefits at this point - resume lower dose of cardizem 11/20 as she is having soft BPs at this time   Acute metabolic encephalopathy - CT without acute findings - slowly improving with therapy   Pancytopenia  - Pt has been taking weekly methotrexate - discussed with family, will discontinue at this time as risks outweigh benefits - platelets continue to drop now down to 17 with oral bleeding - transfuse 2 units platelets 11/20  Question of Rheumatoid Arthritis  - Pt taking weekly methotrexate for "arthritis condition" per daughter - given pancytopenia decision was made this admission with daughter to discontinue methotrexate indefinitely   Stage IV CKD  - continue to follow closely  - avoid nephrotoxic agents   DNR present on admission  - discussed goals of care with family - palliative care consultation requested for goals of care    DVT prophylaxis:  SCDs Code Status: DNR  Family Communication: daughters at bedside 11/19, 11/21 Disposition: TBD  Status is: Inpatient  Remains inpatient appropriate because: IV medications used for acute  care    Consultants:  Palliative care    Procedures:  N/a  Antimicrobials:     Subjective: Appears more weakened today with Hg down.  Having sore mouth.      Objective: Vitals:   02/01/21 1044 02/01/21 1050 02/01/21 1330 02/01/21 1412  BP: 127/77 127/77 (!) 149/73   Pulse: 90 90 94   Resp: 20  (!) 23   Temp: 98.1 F (36.7 C) 98.1 F (36.7 C) 98.9 F (37.2 C)    TempSrc:  Axillary Axillary   SpO2:  98% 98% 98%  Weight:      Height:        Intake/Output Summary (Last 24 hours) at 02/01/2021 1514 Last data filed at 02/01/2021 1336 Gross per 24 hour  Intake 1094 ml  Output 1401 ml  Net -307 ml   Filed Weights   01/30/21 0500 01/31/21 0500 02/01/21 0411  Weight: 92 kg 91.2 kg 93.4 kg    Examination:  General exam: somnolent but arousable, very hard of hearing.    Respiratory system: diffuse rales coarse anterior upper airway congestion slightly improved only,  moderate increased work of breathing.   Cardiovascular system: normal S1 & S2 heard. No JVD, murmurs, rubs, gallops or clicks. No pedal edema. Gastrointestinal system: Abdomen is nondistended, soft and nontender. No organomegaly or masses felt. Normal bowel sounds heard. Central nervous system: Alert and oriented. No focal neurological deficits. Extremities: Symmetric 5 x 5 power. Skin: No rashes, lesions or ulcers Psychiatry: Judgement and insight appear UTD. Mood & affect UTD.   Data Reviewed: I have personally reviewed following labs and imaging studies  CBC: Recent Labs  Lab 01/28/21 1424 01/29/21 0349 01/30/21 0504 01/31/21 0729 02/01/21 0417 02/01/21 0725  WBC 4.5 2.0* 1.5* 2.7* 2.3*  --   NEUTROABS 3.9  --   --   --  1.7  --   HGB 5.7* 6.1* 7.2* 8.3* 6.6* 6.7*  HCT 20.8* 22.1* 24.9* 29.3* 22.6* 22.8*  MCV 91.6 90.9 89.6 92.4 90.4  --   PLT 55* 31* 22* 17* 24*  --     Basic Metabolic Panel: Recent Labs  Lab 01/28/21 1424 01/29/21 0349 01/29/21 0846 01/29/21 1251 01/29/21 1657 01/30/21 0504 01/31/21 0729 02/01/21 0417  NA 140 142  --   --   --  144 145 138  K 5.7* 3.1*   < > 2.9* 2.9* 2.3* 3.4* 3.3*  CL 105 103  --   --   --  99 100 94*  CO2 29 32  --   --   --  39* 40* 40*  GLUCOSE 134* 141*  --   --   --  116* 135* 170*  BUN 58* 51*  --   --   --  36* 27* 28*  CREATININE 2.06* 1.61*  --   --   --  1.13* 0.97 0.87  CALCIUM 9.8 9.3  --   --   --  9.3  9.6 9.7  MG  --  2.3  --   --   --  1.9  --  1.9  PHOS  --  5.2*  --   --   --  2.8  --   --    < > = values in this interval not displayed.    GFR: Estimated Creatinine Clearance: 46.7 mL/min (by C-G formula based on SCr of 0.87 mg/dL).  Liver Function Tests: Recent Labs  Lab 01/28/21 1634 01/29/21 0349 01/30/21 0504  AST 14* 12*  --  ALT 12 10  --   ALKPHOS 59 57  --   BILITOT 1.3* 1.4*  --   PROT 5.9* 5.8*  --   ALBUMIN 3.0* 2.8* 2.7*    CBG: No results for input(s): GLUCAP in the last 168 hours.  Recent Results (from the past 240 hour(s))  Expectorated Sputum Assessment w Gram Stain, Rflx to Resp Cult     Status: None   Collection Time: 01/28/21 12:53 PM   Specimen: Sputum  Result Value Ref Range Status   Specimen Description SPU  Final   Special Requests NONE  Final   Sputum evaluation   Final    THIS SPECIMEN IS ACCEPTABLE FOR SPUTUM CULTURE Performed at [facility], [address] [place], [place] [address]    Report Status 01/28/2021 FINAL  Final  Culture, Respiratory w Gram Stain     Status: None   Collection Time: 01/28/21 12:53 PM   Specimen: Sputum  Result Value Ref Range Status   Specimen Description   Final    SPU Performed at [facility], [address] [place], [place] [address]    Special Requests   Final    NONE Reflexed from [id number] Performed at [facility], [address], [place], [place] [address]    Gram Stain   Final    FEW SQUAMOUS EPITHELIAL CELLS PRESENT MODERATE WBC PRESENT,BOTH PMN AND MONONUCLEAR ABUNDANT GRAM POSITIVE COCCI FEW GRAM NEGATIVE RODS RARE GRAM POSITIVE RODS    Culture   Final    FEW Consistent with normal respiratory flora. No Pseudomonas species isolated Performed at [facility] [address], [place], [place] [address]    Report Status 02/01/2021 FINAL  Final  Resp Panel by RT-PCR (Flu A&B, Covid) Nasopharyngeal Swab     Status: None   Collection Time: 01/28/21  4:30 PM   Specimen: Nasopharyngeal  Swab; Nasopharyngeal(NP) swabs in vial transport medium  Result Value Ref Range Status   SARS Coronavirus 2 by RT PCR NEGATIVE NEGATIVE Final    Comment: (NOTE) SARS-CoV-2 target nucleic acids are NOT DETECTED.  The SARS-CoV-2 RNA is generally detectable in upper respiratory specimens during the acute phase of infection. The lowest concentration of SARS-CoV-2 viral copies this assay can detect is 138 copies/mL. A negative result does not preclude SARS-Cov-2 infection and should not be used as the sole basis for treatment or other patient management decisions. A negative result may occur with  improper specimen collection/handling, submission of specimen other than nasopharyngeal swab, presence of viral mutation(s) within the areas targeted by this assay, and inadequate number of viral copies(<138 copies/mL). A negative result must be combined with clinical observations, patient history, and epidemiological information. The expected result is Negative.  Fact Sheet for Patients:  EntrepreneurPulse.com.au  Fact Sheet for Healthcare Providers:  IncredibleEmployment.be  This test is no t yet approved or cleared by the [place] FDA and  has been authorized for detection and/or diagnosis of SARS-CoV-2 by FDA under an Emergency Use Authorization (EUA). This EUA will remain  in effect (meaning this test can be used) for the duration of the COVID-19 declaration under Section 564(b)(1) of the Act, 21 U.S.C.section 360bbb-3(b)(1), unless the authorization is terminated  or revoked sooner.       Influenza A by PCR NEGATIVE NEGATIVE Final   Influenza B by PCR NEGATIVE NEGATIVE Final    Comment: (NOTE) The Xpert Xpress SARS-CoV-2/FLU/RSV plus assay is intended as an aid in the diagnosis of influenza from Nasopharyngeal swab specimens  and should not be used as a sole basis for treatment. Nasal washings and aspirates are unacceptable for Xpert Xpress  SARS-CoV-2/FLU/RSV testing.  Fact Sheet for Patients: EntrepreneurPulse.com.au  Fact Sheet for Healthcare Providers: IncredibleEmployment.be  This test is not yet approved or cleared by the [place] FDA and has been authorized for detection and/or diagnosis of SARS-CoV-2 by FDA under an Emergency Use Authorization (EUA). This EUA will remain in effect (meaning this test can be used) for the duration of the COVID-19 declaration under Section 564(b)(1) of the Act, 21 U.S.C. section 360bbb-3(b)(1), unless the authorization is terminated or revoked.  Performed at [facility], [address], [place], [place] [address]   MRSA Next Gen by PCR, Nasal     Status: None   Collection Time: 01/29/21  5:09 AM   Specimen: Nasal Mucosa; Nasal Swab  Result Value Ref Range Status   MRSA by PCR Next Gen NOT DETECTED NOT DETECTED Final    Comment: (NOTE) The GeneXpert MRSA Assay (FDA approved for NASAL specimens only), is one component of a comprehensive MRSA colonization surveillance program. It is not intended to diagnose MRSA infection nor to guide or monitor treatment for MRSA infections. Test performance is not FDA approved in patients less than 52 years old. Performed at [facility], [address], [place], [place] [address]      Radiology Studies: DG CHEST PORT 1 VIEW  Result Date: 02/01/2021 CLINICAL DATA:  CHF follow-up. EXAM: PORTABLE CHEST 1 VIEW COMPARISON:  Portable chest 01/30/2021. FINDINGS: 4:38 a.m., 02/01/2021. The heart is enlarged. Central vascular distension and generalized interstitial consolidation consistent with edema are again shown. There is no significant improvement or worsening in the interstitial component. The edema follows a basal gradient as before and there are moderate right-greater-than-left pleural effusions and overlying opacities again in the lower lung fields consistent with atelectasis or consolidation. Today there  are increased opacities in the peripheral mid lung areas which could be ground-glass edema or pneumonitis. There are no other new opacities. IMPRESSION: CHF and interstitial edema, today with increased opacities in the peripheral mid lung regions which could be alveolar edema or pneumonitis. No other changes in the overall aeration as described above. Electronically Signed   By: Telford Nab M.D.   On: 02/01/2021 05:51    Scheduled Meds:  sodium chloride   Intravenous Once   Chlorhexidine Gluconate Cloth  6 each Topical Daily   dextromethorphan-guaiFENesin  1 tablet Oral BID   diltiazem  120 mg Oral Daily   DULoxetine  40 mg Oral Daily   feeding supplement  237 mL Oral BID BM   fluticasone furoate-vilanterol  1 puff Inhalation Daily   furosemide  30 mg Intravenous BID   ipratropium-albuterol  3 mL Nebulization TID   methylPREDNISolone (SOLU-MEDROL) injection  40 mg Intravenous Q12H   pantoprazole  40 mg Oral QAC breakfast   potassium chloride  40 mEq Oral BID   umeclidinium bromide  1 puff Inhalation Daily   vitamin B-12  1,000 mcg Oral Daily   Continuous Infusions:   LOS: 4 days   Time spent: 35 minutes   Quenna Doepke Wynetta Emery, MD How to contact the [facility] Attending or Consulting provider [place] or covering provider during after hours [place], for this patient?  Check the care team in [facility] and look for a) attending/consulting [facility] provider listed and b) the [facility] team listed Log into [email or web address] and use [place]'s universal password to access. If you do not have the  password, please contact the hospital operator. Locate the [facility] provider you are looking for under [facility] and page to a number that you can be directly reached. If you still have difficulty reaching the provider, please page the [facility] (Director on Call) for the Hospitalists listed on amion for assistance.  02/01/2021, 3:14 PM

## 2021-02-01 NOTE — Consult Note (Signed)
Consultation Note Date: 02/01/2021   Patient Name: [name]  DOB: 08-Sep-1928  MRN: [id number]  Age / Sex: 87 y.o., female  PCP: Gerlene Fee, NP Referring Physician: Murlean Iba, MD  Reason for Consultation: Establishing goals of care and Psychosocial/spiritual support  HPI/Patient Profile: 87 y.o. female  with past medical history of dementia, cognitive communication deficit, chronic respiratory failure on supplemental oxygen at 2.5 L, DM, CKD 3, A. fib on Eliquis, GERD, diverticulosis, COPD, methotrexate use for RA versus fibromyalgia, back pain, hereditary peripheral neuropathy resident of [facility] where she ca self ambulate with a wheelchair and transfer unaided, admitted on 01/28/2021 with acute anemia, multifactorial and acute on chronic respiratory failure with hypoxia slow to improve/heart failure.   Clinical Assessment and Goals of Care: I have reviewed medical records including EPIC notes, labs and imaging, received report from RN, assessed the patient.  Mrs. Ismael is lying quietly in bed.  She appears acutely/chronically ill and quite frail and elderly.  She will open her eyes and briefly make contact but due to memory loss and hearing loss, she has limited ability to communicate.  Her daughter, Festus Holts, is at bedside.  Festus Holts shares that daughter, Catarina Hartshorn, is healthcare power of attorney.  Festus Holts shares that she low should return later in the day.    Returned to patient room to speak with daughter/HC POA, Catarina Hartshorn regarding diagnosis, prognosis, GOC, EOL wishes, disposition and options.  I introduced Palliative Medicine as specialized medical care for people living with serious illness. It focuses on providing relief from the symptoms and stress of a serious illness. The goal is to improve quality of life for both the patient and the family.    We discussed a brief life review of the  patient.  [name] states that Mrs. Edgley spouse died in 06-12-2007.  He was on comfort care, status post strokes.  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  Mrs. Holstrom has 4 living children, she has lost a daughter to cancer.  This daughter who died with cancer died and she lives home under hospice care.   Mrs. Cerritos started having falls and memory loss, struggled at home for quite some time before spending 2 years at [place].  She has been a resident of [facility] for approximately 5 years now.  We then focused on their current illness.  We talked in detail about heart failure, fluid overload.  We talked about anemia,, hemoglobin, blood loss, recommendations against EGD.  I share my worries about Mrs. Mumaw ability to recover.  I share that just like I will get sick again, so we will she.    We talked about the concept of "treat the treatable, but allowing natural death"/DNR.  We also talked about the concept of "let nature take its course".  [name] shares that Mrs. Vaillancourt has had for quite some time that she is ready to go home and be with Jesus and her husband.  The natural disease trajectory and expectations at EOL  were discussed.  The difference between aggressive medical intervention and comfort care was considered in light of the patient's goals of care.  We talk in detail about what is and is not provided with comfort care.  We talked about symptom management for pain, breathlessness, anxiety.  I share that comfort care means no further labs, IV draws, IV fluids, medicines that are not focused on comfort.  I share that Mrs. Cerro would still be able to receive food and liquids as she so desired.  We talked about residential hospice, facility and we have worked with be their choice.  We talked about comfort care here in the hospital.  Advanced directives, concepts specific to code status, artifical feeding and hydration, and rehospitalization were considered and discussed.   Mrs. Avis is DNR, she made this choice for herself.  Discussed the importance of continued conversation with family and the medical providers regarding overall plan of care and treatment options, ensuring decisions are within the context of the patient's values and GOCs.  Questions and concerns were addressed. The family was encouraged to call with questions or concerns.  PMT will continue to support holistically.  Conference with attending, bedside nursing staff, transition of care team related to patient condition, needs, goals of care, disposition.  PMT to meet with daughter/HC POA, 11/22 at 0900.   HCPOA   HCPOA -daughter, Catarina Hartshorn.  Mrs. Eggleton has 4 living children, is a widow.    SUMMARY OF RECOMMENDATIONS   At this point continue to treat the treatable but no CPR or intubation. Family meeting 11/20 20900. Considering comfort care   Code Status/Advance Care Planning: DNR  Symptom Management:  Per hospitalist, no additional needs at this time.  Palliative Prophylaxis:  Frequent Pain Assessment, Oral Care, and Turn Reposition  Additional Recommendations (Limitations, Scope, Preferences): Continue to treat the treatable, considering to life choices  Psycho-social/Spiritual:  Desire for further Chaplaincy support:no Additional Recommendations: Caregiving  Support/Resources and Education on Hospice  Prognosis:  Unable to determine, based on outcomes.  If family were to focus on comfort and dignity, let nature take its course, 2 weeks or less would be expected.  Even with aggressive treatment, 3 months or less would not be surprising.  Discharge Planning:  To be determined, based on outcomes/family choice.       Primary Diagnoses: Present on Admission:  Fall at nursing home  Thrombocytopenia [facility])  COPD (chronic obstructive pulmonary disease) (HCC)  GERD (gastroesophageal reflux disease)   I have reviewed the medical record, interviewed the patient and  family, and examined the patient. The following aspects are pertinent.  Past Medical History:  Diagnosis Date   Anemia    Atrial fibrillation (HCC)    Back pain    Bacterial pneumonia    Cancer ([place])    Skin    Chronic respiratory failure ([place])    Cognitive communication deficit    COPD (chronic obstructive pulmonary disease) (HCC)    Dementia ([place])    Depression    Diabetes mellitus without complication ([place])    Diverticulosis    Fatty liver    Fibromyalgia    Gastric polyp 09/08/11   at the anastomosis-inflammatory   Generalized anxiety disorder    GERD (gastroesophageal reflux disease)    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)    Hypercholesteremia    Hypertension    Insomnia    Internal hemorrhoids    Muscle weakness    Neuropathy    On home O2    qhs  Osteoporosis    Rheumatoid arteritis ([place])    Ulcer    stomach   Vitamin D deficiency    Social History   Socioeconomic History   Marital status: Widowed    Spouse name: Not on file   Number of children: 6   Years of education: Not on file   Highest education level: Not on file  Occupational History   Occupation: Retired  Tobacco Use   Smoking status: Never   Smokeless tobacco: Never  Vaping Use   Vaping Use: Never used  Substance and Sexual Activity   Alcohol use: No   Drug use: No   Sexual activity: Not Currently  Other Topics Concern   Not on file  Social History Narrative   Long term resident of [facility]    Social Determinants of Health   Financial Resource Strain: Not on file  Food Insecurity: Not on file  Transportation Needs: Not on file  Physical Activity: Not on file  Stress: Not on file  Social Connections: Not on file   Family History  Problem Relation Age of Onset   Heart disease Father    Kidney disease Mother        kidney cancer   Colon cancer Daughter 81   Scheduled Meds:  sodium chloride   Intravenous Once   Chlorhexidine Gluconate Cloth  6 each Topical Daily    dextromethorphan-guaiFENesin  1 tablet Oral BID   diltiazem  120 mg Oral Daily   DULoxetine  40 mg Oral Daily   feeding supplement  237 mL Oral BID BM   fluticasone furoate-vilanterol  1 puff Inhalation Daily   furosemide  30 mg Intravenous BID   ipratropium-albuterol  3 mL Nebulization TID   methylPREDNISolone (SOLU-MEDROL) injection  40 mg Intravenous Q12H   pantoprazole  40 mg Oral QAC breakfast   potassium chloride  40 mEq Oral BID   umeclidinium bromide  1 puff Inhalation Daily   vitamin B-12  1,000 mcg Oral Daily   Continuous Infusions: PRN Meds:.ipratropium-albuterol, phenol, traMADol Medications Prior to Admission:  Prior to Admission medications   Medication Sig Start Date End Date Taking? Authorizing Provider  acetaminophen (TYLENOL) 650 MG CR tablet Take 650 mg by mouth every 8 (eight) hours.   Yes [provider]  Amino Acids-Protein Hydrolys (PRO-STAT) LIQD Take by mouth. liquid; 15-100 gram-kcal/30 mL; oral Three Times A Day  Special Instructions: for albumin 2.8   Yes [provider]  apixaban (ELIQUIS) 2.5 MG TABS tablet Take 2.5 mg by mouth 2 (two) times daily.   Yes [provider]  cefTRIAXone (ROCEPHIN) IVPB Inject 1 g into the vein daily. pnemonia   Yes [provider]  diltiazem (CARDIZEM CD) 300 MG 24 hr capsule Take 300 mg by mouth daily. 02/05/19  Yes [provider]  donepezil (ARICEPT) 10 MG tablet Take 10 mg by mouth at bedtime. For unspecified dementia without behavioral disturbance]   Yes [provider]  DULoxetine (CYMBALTA) 20 MG capsule Take 40 mg by mouth daily.  10/18/19  Yes [provider]  Fluticasone-Umeclidin-Vilant (TRELEGY ELLIPTA) 100-62.5-25 MCG/INH AEPB Inhale 1 puff into the lungs daily.   Yes [provider]  guaiFENesin (MUCINEX) 600 MG 12 hr tablet Take 600 mg by mouth 2 (two) times daily.   Yes [provider]  guaiFENesin (ROBITUSSIN) 100 MG/5ML liquid Take 5  mLs by mouth every 6 (six) hours.   Yes [provider]  ipratropium-albuterol (DUONEB) 0.5-2.5 (3) MG/3ML SOLN Take 3  mLs by nebulization every 6 (six) hours.   Yes [provider]  methotrexate (RHEUMATREX) 2.5 MG tablet Take 15 mg by mouth every Sunday. Caution:Chemotherapy. Protect from light.   Yes [provider]  pantoprazole (PROTONIX) 40 MG tablet Take 40 mg by mouth 2 (two) times daily. 02/05/19  Yes [provider]  potassium chloride SA (K-DUR,KLOR-CON) 20 MEQ tablet Take 40 mEq by mouth 3 (three) times daily. 02/05/19  Yes [provider]  pregabalin (LYRICA) 50 MG capsule Take 1 capsule by mouth  Twice daily Patient taking differently: Take 50 mg by mouth 2 (two) times daily. 01/12/21  Yes Gerlene Fee, NP  torsemide (DEMADEX) 20 MG tablet Take 40 mg by mouth 2 (two) times daily.   Yes [provider]  traMADol (ULTRAM) 50 MG tablet Take 0.5 tablets (25 mg total) by mouth at bedtime. 01/12/21  Yes Gerlene Fee, NP  vitamin B-12 (CYANOCOBALAMIN) 1000 MCG tablet Take 1,000 mcg by mouth daily. 8 am for b12 level of 388   Yes [provider]  denosumab (PROLIA) 60 MG/ML SOSY injection Inject 60 mg into the skin every 6 (six) months.    [provider]  NON FORMULARY Diet Type:  NAS,  Consistent Carbohydrates, Dysphagia 3 05/06/17   [provider]  OXYGEN Inhale 3.5 L/min into the lungs continuous. 05/06/17   [provider]   Allergies  Allergen Reactions   Other     Band Aids    Tape    Lipitor [Atorvastatin Calcium] Rash and Other (See Comments)    Muscle pain   Niaspan [Niacin Er] Rash and Other (See Comments)    Muscle pain   Review of Systems  Unable to perform ROS: Acuity of condition   Physical Exam Vitals and nursing note reviewed.  Constitutional:      General: She is not in acute distress.    Appearance: She is obese. She is ill-appearing.  Cardiovascular:     Rate and  Rhythm: Normal rate.  Skin:    General: Skin is warm and dry.     Coloration: Skin is pale.  Neurological:     Comments: Unable to ask orientation questions due to acuity of condition and hearing loss    Vital Signs: BP (!) 149/73   Pulse 94   Temp 98.9 F (37.2 C) (Axillary)   Resp (!) 23   Ht 5\' 3"  (1.6 m)   Wt 93.4 kg   SpO2 98%   BMI 36.48 kg/m  Pain Scale: 0-10 POSS *See Group Information*: 2-Acceptable,Slightly drowsy, easily aroused Pain Score: 2    SpO2: SpO2: 98 % O2 Device:SpO2: 98 % O2 Flow Rate: .O2 Flow Rate (L/min): 3 L/min  IO: Intake/output summary:  Intake/Output Summary (Last 24 hours) at 02/01/2021 1427 Last data filed at 02/01/2021 1336 Gross per 24 hour  Intake 1106 ml  Output 1401 ml  Net -295 ml    LBM: Last BM Date:  (PTA) Baseline Weight: Weight: 92.9 kg Most recent weight: Weight: 93.4 kg     Palliative Assessment/Data:   Flowsheet Rows    Flowsheet Row Most Recent Value  Intake Tab   Referral Department Hospitalist  Unit at Time of Referral Med/Surg Unit  Palliative Care Primary Diagnosis Cardiac  Date Notified 01/31/21  Palliative Care Type New Palliative care  Reason for referral Clarify Goals of Care  Date of Admission 01/28/21  Date first seen by Palliative Care 02/01/21  # of days Palliative referral  response time 1 Day(s)  # of days IP prior to Palliative referral 3  Clinical Assessment   Palliative Performance Scale Score 30%  Pain Max last 24 hours Not able to report  Pain Min Last 24 hours Not able to report  Dyspnea Max Last 24 Hours Not able to report  Dyspnea Min Last 24 hours Not able to report  Psychosocial & Spiritual Assessment   Palliative Care Outcomes        Time In: 1400 Time Out: 1510 Time Total: 70 minutse  Greater than 50%  of this time was spent counseling and coordinating care related to the above assessment and plan.  Signed by: Drue Novel, NP   Please contact Palliative Medicine Team  phone at [phone] for questions and concerns.  For individual provider: See Shea Evans

## 2021-02-02 DIAGNOSIS — Z7189 Other specified counseling: Secondary | ICD-10-CM

## 2021-02-02 DIAGNOSIS — Z515 Encounter for palliative care: Secondary | ICD-10-CM

## 2021-02-02 LAB — BPAM RBC
Blood Product Expiration Date: 202212222359
ISSUE DATE / TIME: 202211211021
Unit Type and Rh: 5100

## 2021-02-02 LAB — TYPE AND SCREEN
ABO/RH(D): O POS
Antibody Screen: NEGATIVE
Unit division: 0

## 2021-02-02 LAB — CBC
HCT: 25.2 % — ABNORMAL LOW (ref 36.0–46.0)
Hemoglobin: 7.2 g/dL — ABNORMAL LOW (ref 12.0–15.0)
MCH: 25.4 pg — ABNORMAL LOW (ref 26.0–34.0)
MCHC: 28.6 g/dL — ABNORMAL LOW (ref 30.0–36.0)
MCV: 89 fL (ref 80.0–100.0)
Platelets: 22 10*3/uL — CL (ref 150–400)
RBC: 2.83 MIL/uL — ABNORMAL LOW (ref 3.87–5.11)
RDW: 22.9 % — ABNORMAL HIGH (ref 11.5–15.5)
WBC: 2.1 10*3/uL — ABNORMAL LOW (ref 4.0–10.5)
nRBC: 0 % (ref 0.0–0.2)

## 2021-02-02 MED ORDER — ACETAMINOPHEN 650 MG RE SUPP: 650.0000 mg | Freq: Four times a day (QID) | RECTAL | Status: DC | PRN

## 2021-02-02 MED ORDER — LORAZEPAM 2 MG/ML IJ SOLN: 1.0000 mg | INTRAMUSCULAR | Status: DC | PRN

## 2021-02-02 MED ORDER — LORAZEPAM 1 MG PO TABS: 1.0000 mg | ORAL_TABLET | ORAL | Status: DC | PRN

## 2021-02-02 MED ORDER — GLYCOPYRROLATE 1 MG PO TABS: 1.0000 mg | ORAL_TABLET | ORAL | Status: DC | PRN

## 2021-02-02 MED ORDER — MORPHINE SULFATE (CONCENTRATE) 10 MG/0.5ML PO SOLN
2.6000 mg | ORAL | Status: DC | PRN
  Administered 2021-02-03 (×2): 2.6 mg via ORAL
  Filled 2021-02-02 (×2): qty 0.5

## 2021-02-02 MED ORDER — GLYCOPYRROLATE 0.2 MG/ML IJ SOLN
0.2000 mg | INTRAMUSCULAR | Status: DC | PRN
  Administered 2021-02-03 (×2): 0.2 mg via INTRAVENOUS
  Filled 2021-02-02 (×2): qty 1

## 2021-02-02 MED ORDER — ACETAMINOPHEN 325 MG PO TABS: 650.0000 mg | ORAL_TABLET | Freq: Four times a day (QID) | ORAL | Status: DC | PRN

## 2021-02-02 MED ORDER — BIOTENE DRY MOUTH MT LIQD: 15.0000 mL | OROMUCOSAL | Status: DC | PRN

## 2021-02-02 MED ORDER — GLYCOPYRROLATE 0.2 MG/ML IJ SOLN: 0.2000 mg | INTRAMUSCULAR | Status: DC | PRN

## 2021-02-02 MED ORDER — POLYVINYL ALCOHOL 1.4 % OP SOLN: 1.0000 [drp] | Freq: Four times a day (QID) | OPHTHALMIC | Status: DC | PRN

## 2021-02-02 MED ORDER — LORAZEPAM 2 MG/ML PO CONC
1.0000 mg | ORAL | Status: DC | PRN
  Filled 2021-02-02: qty 0.5

## 2021-02-02 NOTE — TOC Progression Note (Addendum)
Transition of Care [facility]) - Progression Note    Patient Details  Name: [name] MRN: [id number] Date of Birth: Jul 29, 1928  Transition of Care [facility]) CM/SW Contact  Salome Arnt, [place] Phone Number: 02/02/2021, 3:40 PM  Clinical Narrative:  Per palliative, family requests residential hospice. Cassandra at hospice notified. Dr. Sonny Dandy requesting to speak with palliative or MD. Both were notified and contact information provided. TOC will follow up in AM.    Update: Palliative and MD have spoken with Dr. Sonny Dandy and he does not feel pt is appropriate for [facility]. Discussed with daughter and she is aware pt can return to [facility] with hospice or home with hospice. She will talk to family and consider overnight. [name] requesting full comfort care here now. MD and palliative notified.    Expected Discharge Plan: [place] Barriers to Discharge: Continued Medical Work up  Expected Discharge Plan and Services Expected Discharge Plan: [place] In-house Referral: Clinical Social Work     Living arrangements for the past 2 months: [place]                 DME Arranged: N/A                     Social Determinants of Health (SDOH) Interventions    Readmission Risk Interventions Readmission Risk Prevention Plan 01/29/2021  Transportation Screening Complete  HRI or [place] Complete  Social Work Consult for [place] Planning/Counseling Complete  Palliative Care Screening Not Applicable  Medication Review Press photographer) Complete  Some recent data might be hidden

## 2021-02-02 NOTE — Progress Notes (Signed)
Critical Platelet 22 resulted MD notified.

## 2021-02-02 NOTE — Progress Notes (Incomplete Revision)
Palliative: Mrs. Spainhower is lying quietly in bed.  She appears acutely/chronically ill and quite frail, morbidly obese.  She looks at me, making and somewhat keeping eye contact.  She has known dementia and profound hearing loss and is very difficult to communicate with.  She does smile when I speak with her.  I am not sure if she can make her basic needs known.  Present today at bedside is daughter/HC POA, Catarina Hartshorn and her husband [place].  We talked about Mrs. Kaine acute health concerns including, but not limited to, heart failure and the treatment plan GI bleed and hemoglobin levels.  We talked about blood transfusion and platelet transfusion.  We talk in detail about dispositions options including, but not limited to, LTAC, returning to [facility] with hospice care, going to she lives home with hospice care, residential hospice.  We talk in detail about what is and is not provided in each of these locations.  [name] shares that she is unsure how to care for her mother, what choices to make.  She shares that she has been encouraged that Mrs. Diles is more alert today, and is feeding herself.  We talked again today about how to make choices for loved ones.  We review Mrs. Lansdowne 5 wishes document (under ACP in epic) that she completed in 2011.  We talked about prognosis with permission.  I share that if family were to elect comfort and dignity, residential hospice, 2 weeks would be expected.  I share that even with aggressive treatment months would not be surprising due to Mrs. Friedhoff chronic illness burden and her acute illness with GI bleed and heart failure exacerbation that have not been resolved.  I return later in the day to speak with daughter/HC POA, [name], and eldest daughter Juliann Pulse.  They shared that they had a good visit at hospice and their questions were answered.  She will request that Mrs. Ishmael be referred for residential hospice, comfort care.  If/when accepted to residential  hospice she would be transition to full comfort care.  All questions answered, team updated.  Detail conference with attending, bedside nursing staff, transition of care team related to patient condition, needs, goals of care, disposition.  Addendum: Phone conference with Dr. Sonny Dandy, medical director for hospice of [facility].  At this time he declines to accept Mrs. Spivack to residential hospice. Conference with attending, bedside nursing staff and transition of care team. Call to daughter/HC POA, [name].  [name] request that we transition to full comfort care at this time.  Orders adjusted. PMT to meet at bedside 11/23 @ 0930  Plan:    At this point continue to treat the treatable but no CPR or intubation.  [name] and her husband are going to [facility] for a tour.  PMT to follow later after lunch. Prognosis: If family elects full comfort care in 2 weeks or less would be anticipated based on heart failure, fluid overload, suspected GI bleed with downward trend in hemoglobin.  65 minutes, extended time Quinn Axe, NP Palliative medicine team Team phone [phone] Greater than 50% of this time was spent counseling and coordinating care related to the above assessment and plan.

## 2021-02-02 NOTE — Progress Notes (Addendum)
Palliative: Mrs. Madej is lying quietly in bed.  She appears acutely/chronically ill and quite frail, morbidly obese.  She looks at me, making and somewhat keeping eye contact.  She has known dementia and profound hearing loss and is very difficult to communicate with.  She does smile when I speak with her.  I am not sure if she can make her basic needs known.  Present today at bedside is daughter/HC POA, Catarina Hartshorn and her husband [place].  We talked about Mrs. Orihuela acute health concerns including, but not limited to, heart failure and the treatment plan GI bleed and hemoglobin levels.  We talked about blood transfusion and platelet transfusion.  We talk in detail about dispositions options including, but not limited to, LTAC, returning to [facility] with hospice care, going to she lives home with hospice care, residential hospice.  We talk in detail about what is and is not provided in each of these locations.  [name] shares that she is unsure how to care for her mother, what choices to make.  She shares that she has been encouraged that Mrs. Alcaraz is more alert today, and is feeding herself.  We talked again today about how to make choices for loved ones.  We review Mrs. Hollingworth 5 wishes document (under ACP in epic) that she completed in 2011.  We talked about prognosis with permission.  I share that if family were to elect comfort and dignity, residential hospice, 2 weeks would be expected.  I share that even with aggressive treatment months would not be surprising due to Mrs. Komperda chronic illness burden and her acute illness with GI bleed and heart failure exacerbation that have not been resolved.  I return later in the day to speak with daughter/HC POA, [name], and eldest daughter Juliann Pulse.  They shared that they had a good visit at hospice and their questions were answered.  She will request that Mrs. Berthold be referred for residential hospice, comfort care.  If/when accepted to residential  hospice she would be transition to full comfort care.  All questions answered, team updated.  Detail conference with attending, bedside nursing staff, transition of care team related to patient condition, needs, goals of care, disposition.  Addendum: Phone conference with Dr. Sonny Dandy, medical director for hospice of [facility].  At this time he declines to accept Mrs. Vanmetre to residential hospice. Conference with attending, bedside nursing staff and transition of care team. Call to daughter/HC POA, [name].  [name] request that we transition to full comfort care at this time.  Orders adjusted. PMT to meet at bedside 11/23 @ 0930  Plan:    At this point continue to treat the treatable but no CPR or intubation.  [name] and her husband are going to [facility] for a tour.  PMT to follow later after lunch. Prognosis: If family elects full comfort care in 2 weeks or less would be anticipated based on heart failure, fluid overload, suspected GI bleed with downward trend in hemoglobin.  65 minutes, extended time Quinn Axe, NP Palliative medicine team Team phone [phone] Greater than 50% of this time was spent counseling and coordinating care related to the above assessment and plan.

## 2021-02-02 NOTE — Progress Notes (Signed)
PROGRESS NOTE   [name]  ZOX:[id number] DOB: 05/15/1928 DOA: 01/28/2021 PCP: Gerlene Fee, NP   Chief Complaint  Patient presents with   Altered Mental Status   Level of care: Telemetry  Brief Admission History:  87 y.o. female with medical history significant for chronic respiratory failure on supplemental oxygen at 2.5 LPM, CKD stage III, rheumatoid arthritis, A. fib on Eliquis, GERD, COPD admitted on 01/28/2021 with acute metabolic cephalopathy secondary to acute on chronic respiratory failure with hypoxia and hypercapnia in the setting of CHF and COPD exacerbation, compounded by acute on chronic anemia with concern for acute GI bleed.    Assessment & Plan:   Principal Problem:   CHF (congestive heart failure) (HCC) Active Problems:   COPD (chronic obstructive pulmonary disease) (HCC)   GERD (gastroesophageal reflux disease)   Acute kidney injury superimposed on CKD (HCC)   Acute on chronic respiratory failure with hypoxia and hypercapnia (HCC)   Atrial fibrillation, chronic (HCC)   Thrombocytopenia (HCC)   Anemia   Fall at nursing home   GI bleed   Acute metabolic encephalopathy   Hyperkalemia   Hypoalbuminemia due to protein-calorie malnutrition (HCC)   Elevated brain natriuretic peptide (BNP) level   Occult GI bleeding   Pressure injury of skin   Goals of care, counseling/discussion   Palliative care by specialist   Encounter for hospice care discussion   Acute Anemia  - multifactorial given patient on methotrexate, taking apixaban and has CKD  - s/p 2 units PRBC - Follow Hg closely.  Recheck in AM  - thrombocytopenia - transfused 2 units platelets 11/20 - Hg down to 6.6.  Transfused another 1 unit PRBC 11/21  Acute on chronic respiratory failure with hypoxia - respiratory status is very slow to improve - discussed with family, will monitor for another 24 hours to look for improvements - if no improvements would consider transition to full  comfort measures  - continue current management - add duonebs, add solumedrol  - ongoing palliative conversation with family  - Pt transitioned to full comfort care 11/22.  HFpEF with acute exacerbation  - Pt is on IV lasix and overall net output is negative  - continue for now - repeat CXR with worsening edema  - Pt transitioned to full comfort care 11/22.  Chronic atrial fibrillation - DC APIXABAN - discussed with family who are in agreement that risks outweigh potential benefits at this point - Pt transitioned to full comfort care 11/22.  Acute metabolic encephalopathy - CT without acute findings - Pt transitioned to full comfort care 11/22.  Pancytopenia  - Pt has been taking weekly methotrexate - discussed with family, will discontinue at this time as risks outweigh benefits - platelets continue to drop now down to 17 with oral bleeding - transfused 2 units platelets 11/20 - Pt transitioned to full comfort care 11/22.  Question of Rheumatoid Arthritis  - Pt taking weekly methotrexate for "arthritis condition" per daughter - given pancytopenia decision was made this admission with daughter to discontinue methotrexate indefinitely  - Pt transitioned to full comfort care 11/22.  Stage IV CKD  - continue to follow closely  - avoid nephrotoxic agents  - Pt transitioned to full comfort care 11/22.  DNR present on admission  - discussed goals of care with family - Pt transitioned to full comfort care 11/22.   DVT prophylaxis:  SCDs Code Status: DNR  Family Communication: daughters at bedside 11/19, 11/21,11/22 Disposition: residential hospice on 11/23 Status  is: Inpatient  Remains inpatient appropriate because: IV medications used for acute care    Consultants:  Palliative care    Procedures:  N/a  Antimicrobials:     Subjective: Pt appears comfortable and eating with daughter.       Objective: Vitals:   02/02/21 0814 02/02/21 0900 02/02/21 1401 02/02/21  1411  BP:  (!) 148/70  (!) 147/77  Pulse:  98  96  Resp:  18  (!) 22  Temp:  98.7 F (37.1 C)  98.2 F (36.8 C)  TempSrc:  Oral  Oral  SpO2: 96% 99% 98% 96%  Weight:      Height:        Intake/Output Summary (Last 24 hours) at 02/02/2021 1814 Last data filed at 02/02/2021 1200 Gross per 24 hour  Intake 0 ml  Output --  Net 0 ml   Filed Weights   01/31/21 0500 02/01/21 0411 02/02/21 0500  Weight: 91.2 kg 93.4 kg 93.7 kg    Examination:  General exam: somnolent but arousable, very hard of hearing.    Respiratory system: diffuse rales coarse anterior upper airway congestion slightly improved only,  moderate increased work of breathing.   Cardiovascular system: normal S1 & S2 heard. No JVD, murmurs, rubs, gallops or clicks. No pedal edema. Gastrointestinal system: Abdomen is nondistended, soft and nontender. No organomegaly or masses felt. Normal bowel sounds heard. Central nervous system: Alert and oriented. No focal neurological deficits. Extremities: Symmetric 5 x 5 power. Skin: No rashes, lesions or ulcers Psychiatry: Judgement and insight appear UTD. Mood & affect UTD.   Data Reviewed: I have personally reviewed following labs and imaging studies  CBC: Recent Labs  Lab 01/28/21 1424 01/29/21 0349 01/30/21 0504 01/31/21 0729 02/01/21 0417 02/01/21 0725 02/02/21 0802  WBC 4.5 2.0* 1.5* 2.7* 2.3*  --  2.1*  NEUTROABS 3.9  --   --   --  1.7  --   --   HGB 5.7* 6.1* 7.2* 8.3* 6.6* 6.7* 7.2*  HCT 20.8* 22.1* 24.9* 29.3* 22.6* 22.8* 25.2*  MCV 91.6 90.9 89.6 92.4 90.4  --  89.0  PLT 55* 31* 22* 17* 24*  --  22*    Basic Metabolic Panel: Recent Labs  Lab 01/28/21 1424 01/29/21 0349 01/29/21 0846 01/29/21 1251 01/29/21 1657 01/30/21 0504 01/31/21 0729 02/01/21 0417  NA 140 142  --   --   --  144 145 138  K 5.7* 3.1*   < > 2.9* 2.9* 2.3* 3.4* 3.3*  CL 105 103  --   --   --  99 100 94*  CO2 29 32  --   --   --  39* 40* 40*  GLUCOSE 134* 141*  --   --   --   116* 135* 170*  BUN 58* 51*  --   --   --  36* 27* 28*  CREATININE 2.06* 1.61*  --   --   --  1.13* 0.97 0.87  CALCIUM 9.8 9.3  --   --   --  9.3 9.6 9.7  MG  --  2.3  --   --   --  1.9  --  1.9  PHOS  --  5.2*  --   --   --  2.8  --   --    < > = values in this interval not displayed.    GFR: Estimated Creatinine Clearance: 46.7 mL/min (by C-G formula based on SCr of 0.87 mg/dL).  Liver Function  Tests: Recent Labs  Lab 01/28/21 1634 01/29/21 0349 01/30/21 0504  AST 14* 12*  --   ALT 12 10  --   ALKPHOS 59 57  --   BILITOT 1.3* 1.4*  --   PROT 5.9* 5.8*  --   ALBUMIN 3.0* 2.8* 2.7*    CBG: No results for input(s): GLUCAP in the last 168 hours.  Recent Results (from the past 240 hour(s))  Expectorated Sputum Assessment w Gram Stain, Rflx to Resp Cult     Status: None   Collection Time: 01/28/21 12:53 PM   Specimen: Sputum  Result Value Ref Range Status   Specimen Description SPU  Final   Special Requests NONE  Final   Sputum evaluation   Final    THIS SPECIMEN IS ACCEPTABLE FOR SPUTUM CULTURE Performed at [facility], [address], [place], [place] [address]    Report Status 01/28/2021 FINAL  Final  Culture, Respiratory w Gram Stain     Status: None   Collection Time: 01/28/21 12:53 PM   Specimen: Sputum  Result Value Ref Range Status   Specimen Description   Final    SPU Performed at [facility], [address], [place], [place] [address]    Special Requests   Final    NONE Reflexed from [id number] Performed at [facility], [address], [place], [place] [address]    Gram Stain   Final    FEW SQUAMOUS EPITHELIAL CELLS PRESENT MODERATE WBC PRESENT,BOTH PMN AND MONONUCLEAR ABUNDANT GRAM POSITIVE COCCI FEW GRAM NEGATIVE RODS RARE GRAM POSITIVE RODS    Culture   Final    FEW Consistent with normal respiratory flora. No Pseudomonas species isolated Performed at [facility] [address] [place], [place] [address]    Report Status 02/01/2021 FINAL   Final  Resp Panel by RT-PCR (Flu A&B, Covid) Nasopharyngeal Swab     Status: None   Collection Time: 01/28/21  4:30 PM   Specimen: Nasopharyngeal Swab; Nasopharyngeal(NP) swabs in vial transport medium  Result Value Ref Range Status   SARS Coronavirus 2 by RT PCR NEGATIVE NEGATIVE Final    Comment: (NOTE) SARS-CoV-2 target nucleic acids are NOT DETECTED.  The SARS-CoV-2 RNA is generally detectable in upper respiratory specimens during the acute phase of infection. The lowest concentration of SARS-CoV-2 viral copies this assay can detect is 138 copies/mL. A negative result does not preclude SARS-Cov-2 infection and should not be used as the sole basis for treatment or other patient management decisions. A negative result may occur with  improper specimen collection/handling, submission of specimen other than nasopharyngeal swab, presence of viral mutation(s) within the areas targeted by this assay, and inadequate number of viral copies(<138 copies/mL). A negative result must be combined with clinical observations, patient history, and epidemiological information. The expected result is Negative.  Fact Sheet for Patients:  EntrepreneurPulse.com.au  Fact Sheet for Healthcare Providers:  IncredibleEmployment.be  This test is no t yet approved or cleared by the [place] FDA and  has been authorized for detection and/or diagnosis of SARS-CoV-2 by FDA under an Emergency Use Authorization (EUA). This EUA will remain  in effect (meaning this test can be used) for the duration of the COVID-19 declaration under Section 564(b)(1) of the Act, 21 U.S.C.section 360bbb-3(b)(1), unless the authorization is terminated  or revoked sooner.       Influenza A by PCR NEGATIVE NEGATIVE Final   Influenza B by PCR NEGATIVE NEGATIVE Final    Comment: (NOTE) The  Xpert Xpress SARS-CoV-2/FLU/RSV plus assay is intended as an aid in the diagnosis of influenza from  Nasopharyngeal swab specimens and should not be used as a sole basis for treatment. Nasal washings and aspirates are unacceptable for Xpert Xpress SARS-CoV-2/FLU/RSV testing.  Fact Sheet for Patients: EntrepreneurPulse.com.au  Fact Sheet for Healthcare Providers: IncredibleEmployment.be  This test is not yet approved or cleared by the [place] FDA and has been authorized for detection and/or diagnosis of SARS-CoV-2 by FDA under an Emergency Use Authorization (EUA). This EUA will remain in effect (meaning this test can be used) for the duration of the COVID-19 declaration under Section 564(b)(1) of the Act, 21 U.S.C. section 360bbb-3(b)(1), unless the authorization is terminated or revoked.  Performed at [facility], [address] [place], [place] [address]   MRSA Next Gen by PCR, Nasal     Status: None   Collection Time: 01/29/21  5:09 AM   Specimen: Nasal Mucosa; Nasal Swab  Result Value Ref Range Status   MRSA by PCR Next Gen NOT DETECTED NOT DETECTED Final    Comment: (NOTE) The GeneXpert MRSA Assay (FDA approved for NASAL specimens only), is one component of a comprehensive MRSA colonization surveillance program. It is not intended to diagnose MRSA infection nor to guide or monitor treatment for MRSA infections. Test performance is not FDA approved in patients less than 51 years old. Performed at [facility], [address] [place],  [address]      Radiology Studies: DG CHEST PORT 1 VIEW  Result Date: 02/01/2021 CLINICAL DATA:  CHF follow-up. EXAM: PORTABLE CHEST 1 VIEW COMPARISON:  Portable chest 01/30/2021. FINDINGS: 4:38 a.m., 02/01/2021. The heart is enlarged. Central vascular distension and generalized interstitial consolidation consistent with edema are again shown. There is no significant improvement or worsening in the interstitial component. The edema follows a basal gradient as before and there are moderate  right-greater-than-left pleural effusions and overlying opacities again in the lower lung fields consistent with atelectasis or consolidation. Today there are increased opacities in the peripheral mid lung areas which could be ground-glass edema or pneumonitis. There are no other new opacities. IMPRESSION: CHF and interstitial edema, today with increased opacities in the peripheral mid lung regions which could be alveolar edema or pneumonitis. No other changes in the overall aeration as described above. Electronically Signed   By: Telford Nab M.D.   On: 02/01/2021 05:51    Scheduled Meds:  sodium chloride   Intravenous Once   dextromethorphan-guaiFENesin  1 tablet Oral BID   fluticasone furoate-vilanterol  1 puff Inhalation Daily   furosemide  30 mg Intravenous BID   ipratropium-albuterol  3 mL Nebulization TID   umeclidinium bromide  1 puff Inhalation Daily   Continuous Infusions:   LOS: 5 days   Time spent: 35 minutes   Becky Bean Wynetta Emery, MD How to contact the [facility] Attending or Consulting provider [place] or covering provider during after hours [place], for this patient?  Check the care team in [facility] and look for a) attending/consulting TRH provider listed and b) the [facility] team listed Log into [email or web address] and use [place]'s universal password to access. If you do not have the password, please contact the hospital operator. Locate the [facility] provider you are looking for under [facility] and page to a number that you can be directly reached. If you still have difficulty reaching the provider, please page the [facility] (Director on Call) for the Hospitalists listed on amion for assistance.  02/02/2021, 6:14  PM    

## 2021-02-02 NOTE — TOC Progression Note (Addendum)
Transition of Care [facility]) - Progression Note    Patient Details  Name: [name] MRN: [id number] Date of Birth: 1928-04-02  Transition of Care [facility]) CM/SW Contact  Boneta Lucks, RN Phone Number: 02/02/2021, 1:42 PM  Clinical Narrative:  Family went to [facility] to tour and ask question. They want a meeting with Palliative to determine to set goals. Palliative is aware.    Expected Discharge Plan: [place] Barriers to Discharge: Continued Medical Work up  Expected Discharge Plan and Services Expected Discharge Plan: [place] In-house Referral: Clinical Social Work    Living arrangements for the past 2 months: [place]                 DME Arranged: N/A    Readmission Risk Interventions Readmission Risk Prevention Plan 01/29/2021  Transportation Screening Complete  HRI or Home Care Consult Complete  Social Work Consult for [place] Planning/Counseling Complete  Palliative Care Screening Not Applicable  Medication Review Press photographer) Complete  Some recent data might be hidden

## 2021-02-03 LAB — RESP PANEL BY RT-PCR (FLU A&B, COVID) ARPGX2
Influenza A by PCR: NEGATIVE
Influenza B by PCR: NEGATIVE
SARS Coronavirus 2 by RT PCR: NEGATIVE

## 2021-02-03 MED ORDER — BIOTENE DRY MOUTH MT LIQD: 15.0000 mL | OROMUCOSAL | 0 refills | Status: AC | PRN

## 2021-02-03 MED ORDER — PHENOL 1.4 % MT LIQD: 2.0000 | OROMUCOSAL | 0 refills | Status: AC | PRN

## 2021-02-03 MED ORDER — POLYVINYL ALCOHOL 1.4 % OP SOLN: 1.0000 [drp] | Freq: Four times a day (QID) | OPHTHALMIC | 0 refills | Status: AC | PRN

## 2021-02-03 NOTE — Plan of Care (Signed)

## 2021-02-03 NOTE — Progress Notes (Signed)
Report called to [place] Levada Dy). Patient transported to [place] via EMS family at bedside at time of transport.

## 2021-02-03 NOTE — Discharge Summary (Signed)
Physician Discharge Summary  [name]:[id number] DOB: Jan 14, 1929 DOA: 01/28/2021  PCP: Gerlene Fee, NP  Admit date: 01/28/2021  Discharge date: 02/03/2021  Admitted From:Home  Disposition:  Hospice facility  Recommendations for Outpatient Follow-up:  Follow up with hospice facility  Home Health:None  Equipment/Devices:None  Discharge Condition:Stable  CODE STATUS: DNR  Diet recommendation: Heart Healthy  Brief/Interim Summary: 87 y.o. female with medical history significant for chronic respiratory failure on supplemental oxygen at 2.5 LPM, CKD stage III, rheumatoid arthritis, A. fib on Eliquis, GERD, COPD admitted on 01/28/2021 with acute metabolic cephalopathy secondary to acute on chronic respiratory failure with hypoxia and hypercapnia in the setting of CHF and COPD exacerbation, compounded by acute on chronic anemia with concern for acute GI bleed.  She was given 2 units of PRBC as well as platelets on 11/20 and then given another unit of PRBC on 11/21 given decreasing hemoglobin levels.  She was noted to have worsening hypoxemia as well as heart failure.  She was seen by palliative care and eventually transition to full comfort care per patient and family wishes.  She is now ready for discharge to residential hospice and overall has poor long-term prognosis.  She is overall stable for discharge with no other acute events or concerns noted throughout the course of the stay.  Discharge Diagnoses:  Principal Problem:   CHF (congestive heart failure) (HCC) Active Problems:   COPD (chronic obstructive pulmonary disease) (HCC)   GERD (gastroesophageal reflux disease)   Acute kidney injury superimposed on CKD (HCC)   Acute on chronic respiratory failure with hypoxia and hypercapnia (HCC)   Atrial fibrillation, chronic (HCC)   Thrombocytopenia (HCC)   Anemia   Fall at nursing home   GI bleed   Acute metabolic encephalopathy   Hyperkalemia   Hypoalbuminemia  due to protein-calorie malnutrition (HCC)   Elevated brain natriuretic peptide (BNP) level   Occult GI bleeding   Pressure injury of skin   Goals of care, counseling/discussion   Palliative care by specialist   Encounter for hospice care discussion  Principal discharge diagnosis: Acute symptomatic anemia with pancytopenia in the setting of rheumatoid arthritis with methotrexate use.  Acute hypoxemic respiratory failure secondary to acute diastolic CHF exacerbation as well as COPD exacerbation.  Discharge Instructions  Discharge Instructions     Diet - low sodium heart healthy   Complete by: As directed    Increase activity slowly   Complete by: As directed    No wound care   Complete by: As directed       Allergies as of 02/03/2021       Reactions   Other    Band Aids    Tape    Lipitor [atorvastatin Calcium] Rash, Other (See Comments)   Muscle pain   Niaspan [niacin Er] Rash, Other (See Comments)   Muscle pain        Medication List     STOP taking these medications    apixaban 2.5 MG Tabs tablet Commonly known as: ELIQUIS   cefTRIAXone  IVPB Commonly known as: ROCEPHIN   denosumab 60 MG/ML Sosy injection Commonly known as: PROLIA   diltiazem 300 MG 24 hr capsule Commonly known as: CARDIZEM CD   methotrexate 2.5 MG tablet Commonly known as: RHEUMATREX   potassium chloride SA 20 MEQ tablet Commonly known as: KLOR-CON   pregabalin 50 MG capsule Commonly known as: LYRICA   Pro-Stat Liqd   torsemide 20 MG tablet Commonly known as: DEMADEX  traMADol 50 MG tablet Commonly known as: ULTRAM   Trelegy Ellipta 100-62.5-25 MCG/ACT Aepb Generic drug: Fluticasone-Umeclidin-Vilant   vitamin B-12 1000 MCG tablet Commonly known as: CYANOCOBALAMIN       TAKE these medications    acetaminophen 650 MG CR tablet Commonly known as: TYLENOL Take 650 mg by mouth every 8 (eight) hours.   antiseptic oral rinse Liqd Apply 15 mLs topically as needed for  dry mouth.   donepezil 10 MG tablet Commonly known as: ARICEPT Take 10 mg by mouth at bedtime. For unspecified dementia without behavioral disturbance]   DULoxetine 20 MG capsule Commonly known as: CYMBALTA Take 40 mg by mouth daily.   guaiFENesin 100 MG/5ML liquid Commonly known as: ROBITUSSIN Take 5 mLs by mouth every 6 (six) hours.   guaiFENesin 600 MG 12 hr tablet Commonly known as: MUCINEX Take 600 mg by mouth 2 (two) times daily.   ipratropium-albuterol 0.5-2.5 (3) MG/3ML Soln Commonly known as: DUONEB Take 3 mLs by nebulization every 6 (six) hours.   NON FORMULARY Diet Type:  NAS,  Consistent Carbohydrates, Dysphagia 3   OXYGEN Inhale 3.5 L/min into the lungs continuous.   pantoprazole 40 MG tablet Commonly known as: PROTONIX Take 40 mg by mouth 2 (two) times daily.   phenol 1.4 % Liqd Commonly known as: CHLORASEPTIC Use as directed 2 sprays in the mouth or throat as needed for throat irritation / pain.   polyvinyl alcohol 1.4 % ophthalmic solution Commonly known as: LIQUIFILM TEARS Place 1 drop into both eyes 4 (four) times daily as needed for dry eyes.        Contact information for after-discharge care     [facility] .   Service: Inpatient Hospice Contact information: [address] [address] [phone]                    Allergies  Allergen Reactions   Other     Band Aids    Tape    Lipitor [Atorvastatin Calcium] Rash and Other (See Comments)    Muscle pain   Niaspan [Niacin Er] Rash and Other (See Comments)    Muscle pain    Consultations: Palliative care GI   Procedures/Studies: CT Head Wo Contrast  Result Date: 01/28/2021 CLINICAL DATA:  Head trauma, minor (Age >= 65y). Altered mental status. Fall. EXAM: CT HEAD WITHOUT CONTRAST TECHNIQUE: Contiguous axial images were obtained from the base of the skull through the vertex without intravenous contrast.  COMPARISON:  04/10/2017 FINDINGS: Brain: There is atrophy and chronic small vessel disease changes. No acute intracranial abnormality. Specifically, no hemorrhage, hydrocephalus, mass lesion, acute infarction, or significant intracranial injury. Vascular: No hyperdense vessel or unexpected calcification. Skull: No acute calvarial abnormality. Sinuses/Orbits: No acute findings Other: None IMPRESSION: Atrophy, chronic microvascular disease. No acute intracranial abnormality. Electronically Signed   By: Rolm Baptise M.D.   On: 01/28/2021 18:21   DG CHEST PORT 1 VIEW  Result Date: 02/01/2021 CLINICAL DATA:  CHF follow-up. EXAM: PORTABLE CHEST 1 VIEW COMPARISON:  Portable chest 01/30/2021. FINDINGS: 4:38 a.m., 02/01/2021. The heart is enlarged. Central vascular distension and generalized interstitial consolidation consistent with edema are again shown. There is no significant improvement or worsening in the interstitial component. The edema follows a basal gradient as before and there are moderate right-greater-than-left pleural effusions and overlying opacities again in the lower lung fields consistent with atelectasis or consolidation. Today there are increased opacities in the  peripheral mid lung areas which could be ground-glass edema or pneumonitis. There are no other new opacities. IMPRESSION: CHF and interstitial edema, today with increased opacities in the peripheral mid lung regions which could be alveolar edema or pneumonitis. No other changes in the overall aeration as described above. Electronically Signed   By: Telford Nab M.D.   On: 02/01/2021 05:51   DG Chest Port 1 View  Result Date: 01/30/2021 CLINICAL DATA:  Dyspnea EXAM: PORTABLE CHEST 1 VIEW COMPARISON:  01/28/2021 FINDINGS: Single frontal view of the chest demonstrates a stable cardiac silhouette. Central vascular congestion with diffuse interstitial prominence, bibasilar consolidation, bilateral effusions unchanged, compatible with  congestive heart failure. No pneumothorax. No acute bony abnormalities. IMPRESSION: 1. Stable congestive heart failure. Electronically Signed   By: Randa Ngo M.D.   On: 01/30/2021 17:57   DG Chest Portable 1 View  Result Date: 01/28/2021 CLINICAL DATA:  Cough and congestion.  Acute renal failure. EXAM: PORTABLE CHEST 1 VIEW COMPARISON:  10/26/2020 FINDINGS: Chronic cardiomegaly. Unchanged mediastinal contours. Aortic atherosclerosis. Diffuse interstitial and bronchial thickening suspicious for pulmonary edema, worsened in the interim. Suspected small pleural effusions. Additional patchy bibasilar opacities. No pneumothorax. Degenerative change of both shoulders. No acute osseous abnormalities are seen. The bones are diffusely under mineralized. IMPRESSION: 1. Cardiomegaly with pulmonary edema and small pleural effusions consistent with CHF. 2. Patchy bibasilar opacities, atelectasis versus pneumonia. Electronically Signed   By: Keith Rake M.D.   On: 01/28/2021 18:15   DG Hand Complete Left  Result Date: 01/28/2021 CLINICAL DATA:  Fall today. EXAM: LEFT HAND - COMPLETE 3+ VIEW COMPARISON:  None. FINDINGS: No acute fracture or dislocation. The bones are diffusely under mineralized. Osteoarthritis of the digits primarily affecting the distal interphalangeal joints. Mild osteoarthritis of the thumb at the carpal metacarpal joint. No erosive change. No focal soft tissue abnormalities. IMPRESSION: 1. No acute fracture or dislocation of the left hand. 2. Osteoarthritis and osteopenia. Electronically Signed   By: Keith Rake M.D.   On: 01/28/2021 18:14   ECHOCARDIOGRAM COMPLETE  Result Date: 01/29/2021    ECHOCARDIOGRAM REPORT   Patient Name:   JALYSSA FLEISHER Date of Exam: 01/29/2021 Medical Rec #:  [id number]          Height:       63.0 in Accession #:    [id number]         Weight:       204.8 lb Date of Birth:  1928-08-01          BSA:          1.953 m Patient Age:    18 years            BP:           111/56 mmHg Patient Gender: F                  HR:           81 bpm. Exam Location:  [facility] Procedure: 2D Echo, Cardiac Doppler and Color Doppler Indications:    CHF-Acute Diastolic  History:        Patient has prior history of Echocardiogram examinations, most                 recent 07/27/2016. CHF, COPD, Arrythmias:Atrial Fibrillation;                 Risk Factors:Hypertension, Diabetes and Dyslipidemia.  Sonographer:    DBB Referring Phys: [id number] OLADAPO ADEFESO IMPRESSIONS  1. Left ventricular ejection fraction, by estimation, is 65 to 70%. The left ventricle has normal function. The left ventricle has no regional wall motion abnormalities. The left ventricular internal cavity size was mildly dilated. There is mild left ventricular hypertrophy. Left ventricular diastolic parameters are indeterminate.  2. Right ventricular systolic function is normal. The right ventricular size is mildly enlarged. There is moderately elevated pulmonary artery systolic pressure. The estimated right ventricular systolic pressure is 20.9 mmHg.  3. Left atrial size was severely dilated.  4. Right atrial size was severely dilated.  5. The mitral valve is abnormal. Moderate mitral valve regurgitation. Moderate mitral annular calcification.  6. Tricuspid valve regurgitation is moderate.  7. The aortic valve is tricuspid. There is moderate calcification of the aortic valve. Aortic valve regurgitation is not visualized. Moderate aortic valve stenosis. Aortic valve mean gradient measures 19.5 mmHg. Dimentionless index 0.43.  8. The inferior vena cava is dilated in size with >50% respiratory variability, suggesting right atrial pressure of 8 mmHg. Comparison(s): No prior Echocardiogram. FINDINGS  Left Ventricle: Left ventricular ejection fraction, by estimation, is 65 to 70%. The left ventricle has normal function. The left ventricle has no regional wall motion abnormalities. The left ventricular internal cavity size was  mildly dilated. There is  mild left ventricular hypertrophy. Left ventricular diastolic function could not be evaluated due to atrial fibrillation. Left ventricular diastolic parameters are indeterminate. Right Ventricle: The right ventricular size is mildly enlarged. No increase in right ventricular wall thickness. Right ventricular systolic function is normal. There is moderately elevated pulmonary artery systolic pressure. The tricuspid regurgitant velocity is 3.23 m/s, and with an assumed right atrial pressure of 8 mmHg, the estimated right ventricular systolic pressure is 47.0 mmHg. Left Atrium: Left atrial size was severely dilated. Right Atrium: Right atrial size was severely dilated. Pericardium: There is no evidence of pericardial effusion. Mitral Valve: The mitral valve is abnormal. Moderate mitral annular calcification. Moderate mitral valve regurgitation. MV peak gradient, 11.8 mmHg. The mean mitral valve gradient is 2.0 mmHg. Tricuspid Valve: The tricuspid valve is grossly normal. Tricuspid valve regurgitation is moderate. Aortic Valve: The aortic valve is tricuspid. There is moderate calcification of the aortic valve. There is mild aortic valve annular calcification. Aortic valve regurgitation is not visualized. Moderate aortic stenosis is present. Aortic valve mean gradient measures 19.5 mmHg. Aortic valve peak gradient measures 34.6 mmHg. Aortic valve area, by VTI measures 1.34 cm. Pulmonic Valve: The pulmonic valve was grossly normal. Pulmonic valve regurgitation is trivial. Aorta: The aortic root is normal in size and structure. Venous: The inferior vena cava is dilated in size with greater than 50% respiratory variability, suggesting right atrial pressure of 8 mmHg. IAS/Shunts: No atrial level shunt detected by color flow Doppler.  LEFT VENTRICLE PLAX 2D LVIDd:         5.90 cm   Diastology LVIDs:         3.40 cm   LV e' medial:    6.31 cm/s LV PW:         0.90 cm   LV E/e' medial:  23.9 LV IVS:         1.20 cm   LV e' lateral:   9.14 cm/s LVOT diam:     2.00 cm   LV E/e' lateral: 16.5 LV SV:         88 LV SV Index:   45 LVOT Area:     3.14 cm  RIGHT VENTRICLE RV Basal diam:  3.50 cm  RV Mid diam:    3.60 cm RV S prime:     16.10 cm/s TAPSE (M-mode): 2.2 cm LEFT ATRIUM              Index        RIGHT ATRIUM           Index LA diam:        5.60 cm  2.87 cm/m   RA Area:     30.00 cm LA Vol (A2C):   169.0 ml 86.52 ml/m  RA Volume:   108.00 ml 55.29 ml/m LA Vol (A4C):   129.0 ml 66.04 ml/m LA Biplane Vol: 149.0 ml 76.28 ml/m  AORTIC VALVE                     PULMONIC VALVE AV Area (Vmax):    1.69 cm      PV Vmax:       1.12 m/s AV Area (Vmean):   1.45 cm      PV Peak grad:  5.0 mmHg AV Area (VTI):     1.34 cm AV Vmax:           294.00 cm/s AV Vmean:          204.500 cm/s AV VTI:            0.657 m AV Peak Grad:      34.6 mmHg AV Mean Grad:      19.5 mmHg LVOT Vmax:         158.00 cm/s LVOT Vmean:        94.400 cm/s LVOT VTI:          0.281 m LVOT/AV VTI ratio: 0.43  AORTA Ao Root diam: 2.60 cm Ao Asc diam:  3.40 cm MITRAL VALVE                TRICUSPID VALVE MV Area (PHT): 2.88 cm     TR Peak grad:   41.7 mmHg MV Area VTI:   1.93 cm     TR Vmax:        323.00 cm/s MV Peak grad:  11.8 mmHg MV Mean grad:  2.0 mmHg     SHUNTS MV Vmax:       1.72 m/s     Systemic VTI:  0.28 m MV Vmean:      66.6 cm/s    Systemic Diam: 2.00 cm MV Decel Time: 263 msec MV E velocity: 151.00 cm/s Rozann Lesches MD Electronically signed by Rozann Lesches MD Signature Date/Time: 01/29/2021/10:57:55 AM    Final      Discharge Exam: Vitals:   02/03/21 0550 02/03/21 0850  BP: 140/63   Pulse: 91   Resp: 20   Temp: 98.6 F (37 C)   SpO2: 91% 92%   Vitals:   02/02/21 1411 02/02/21 2040 02/03/21 0550 02/03/21 0850  BP: (!) 147/77  140/63   Pulse: 96  91   Resp: (!) 22  20   Temp: 98.2 F (36.8 C)  98.6 F (37 C)   TempSrc: Oral     SpO2: 96% (!) 70% 91% 92%  Weight:      Height:        General: Pt is alert,  awake, not in acute distress Cardiovascular: RRR, S1/S2 +, no rubs, no gallops Respiratory: CTA bilaterally, no wheezing, no rhonchi, currently on oxygen nasal cannula Abdominal: Soft, NT, ND, bowel sounds + Extremities: no edema, no cyanosis    The results of significant  diagnostics from this hospitalization (including imaging, microbiology, ancillary and laboratory) are listed below for reference.     Microbiology: Recent Results (from the past 240 hour(s))  Expectorated Sputum Assessment w Gram Stain, Rflx to Resp Cult     Status: None   Collection Time: 01/28/21 12:53 PM   Specimen: Sputum  Result Value Ref Range Status   Specimen Description SPU  Final   Special Requests NONE  Final   Sputum evaluation   Final    THIS SPECIMEN IS ACCEPTABLE FOR SPUTUM CULTURE Performed at [facility], [address], [place], [place] [address]    Report Status 01/28/2021 FINAL  Final  Culture, Respiratory w Gram Stain     Status: None   Collection Time: 01/28/21 12:53 PM   Specimen: Sputum  Result Value Ref Range Status   Specimen Description   Final    SPU Performed at [facility], [address], [place], [place] [address]    Special Requests   Final    NONE Reflexed from [id number] Performed at [facility], [address] [place], [place] [address]    Gram Stain   Final    FEW SQUAMOUS EPITHELIAL CELLS PRESENT MODERATE WBC PRESENT,BOTH PMN AND MONONUCLEAR ABUNDANT GRAM POSITIVE COCCI FEW GRAM NEGATIVE RODS RARE GRAM POSITIVE RODS    Culture   Final    FEW Consistent with normal respiratory flora. No Pseudomonas species isolated Performed at [facility] [address] [place], [place] [address]    Report Status 02/01/2021 FINAL  Final  Resp Panel by RT-PCR (Flu A&B, Covid) Nasopharyngeal Swab     Status: None   Collection Time: 01/28/21  4:30 PM   Specimen: Nasopharyngeal Swab; Nasopharyngeal(NP) swabs in vial transport medium  Result Value Ref Range Status   SARS  Coronavirus 2 by RT PCR NEGATIVE NEGATIVE Final    Comment: (NOTE) SARS-CoV-2 target nucleic acids are NOT DETECTED.  The SARS-CoV-2 RNA is generally detectable in upper respiratory specimens during the acute phase of infection. The lowest concentration of SARS-CoV-2 viral copies this assay can detect is 138 copies/mL. A negative result does not preclude SARS-Cov-2 infection and should not be used as the sole basis for treatment or other patient management decisions. A negative result may occur with  improper specimen collection/handling, submission of specimen other than nasopharyngeal swab, presence of viral mutation(s) within the areas targeted by this assay, and inadequate number of viral copies(<138 copies/mL). A negative result must be combined with clinical observations, patient history, and epidemiological information. The expected result is Negative.  Fact Sheet for Patients:  EntrepreneurPulse.com.au  Fact Sheet for Healthcare Providers:  IncredibleEmployment.be  This test is no t yet approved or cleared by the [place] FDA and  has been authorized for detection and/or diagnosis of SARS-CoV-2 by FDA under an Emergency Use Authorization (EUA). This EUA will remain  in effect (meaning this test can be used) for the duration of the COVID-19 declaration under Section 564(b)(1) of the Act, 21 U.S.C.section 360bbb-3(b)(1), unless the authorization is terminated  or revoked sooner.       Influenza A by PCR NEGATIVE NEGATIVE Final   Influenza B by PCR NEGATIVE NEGATIVE Final    Comment: (NOTE) The Xpert Xpress SARS-CoV-2/FLU/RSV plus assay is intended as an aid in the diagnosis of influenza from Nasopharyngeal swab specimens and should not be used as a sole basis for treatment. Nasal washings and aspirates are unacceptable for Xpert Xpress SARS-CoV-2/FLU/RSV testing.  Fact Sheet for  Patients: EntrepreneurPulse.com.au  Fact Sheet for Healthcare Providers: IncredibleEmployment.be  This test is not yet approved or cleared by the [place] FDA and has been authorized for detection and/or diagnosis of SARS-CoV-2 by FDA under an Emergency Use Authorization (EUA). This EUA will remain in effect (meaning this test can be used) for the duration of the COVID-19 declaration under Section 564(b)(1) of the Act, 21 U.S.C. section 360bbb-3(b)(1), unless the authorization is terminated or revoked.  Performed at [facility], [address] [place], [place] [address]   MRSA Next Gen by PCR, Nasal     Status: None   Collection Time: 01/29/21  5:09 AM   Specimen: Nasal Mucosa; Nasal Swab  Result Value Ref Range Status   MRSA by PCR Next Gen NOT DETECTED NOT DETECTED Final    Comment: (NOTE) The GeneXpert MRSA Assay (FDA approved for NASAL specimens only), is one component of a comprehensive MRSA colonization surveillance program. It is not intended to diagnose MRSA infection nor to guide or monitor treatment for MRSA infections. Test performance is not FDA approved in patients less than 31 years old. Performed at [facility], [address], [place], [place] [address]      Labs: BNP (last 3 results) Recent Labs    01/28/21 1633  BNP 410.3*   Basic Metabolic Panel: Recent Labs  Lab 01/28/21 1424 01/29/21 0349 01/29/21 0846 01/29/21 1251 01/29/21 1657 01/30/21 0504 01/31/21 0729 02/01/21 0417  NA 140 142  --   --   --  144 145 138  K 5.7* 3.1*   < > 2.9* 2.9* 2.3* 3.4* 3.3*  CL 105 103  --   --   --  99 100 94*  CO2 29 32  --   --   --  39* 40* 40*  GLUCOSE 134* 141*  --   --   --  116* 135* 170*  BUN 58* 51*  --   --   --  36* 27* 28*  CREATININE 2.06* 1.61*  --   --   --  1.13* 0.97 0.87  CALCIUM 9.8 9.3  --   --   --  9.3 9.6 9.7  MG  --  2.3  --   --   --  1.9  --  1.9  PHOS  --  5.2*  --   --   --  2.8  --   --     < > = values in this interval not displayed.   Liver Function Tests: Recent Labs  Lab 01/28/21 1634 01/29/21 0349 01/30/21 0504  AST 14* 12*  --   ALT 12 10  --   ALKPHOS 59 57  --   BILITOT 1.3* 1.4*  --   PROT 5.9* 5.8*  --   ALBUMIN 3.0* 2.8* 2.7*   No results for input(s): LIPASE, AMYLASE in the last 168 hours. Recent Labs  Lab 01/28/21 1633  AMMONIA 17   CBC: Recent Labs  Lab 01/28/21 1424 01/29/21 0349 01/30/21 0504 01/31/21 0729 02/01/21 0417 02/01/21 0725 02/02/21 0802  WBC 4.5 2.0* 1.5* 2.7* 2.3*  --  2.1*  NEUTROABS 3.9  --   --   --  1.7  --   --   HGB 5.7* 6.1* 7.2* 8.3* 6.6* 6.7* 7.2*  HCT 20.8* 22.1* 24.9* 29.3* 22.6* 22.8* 25.2*  MCV 91.6 90.9 89.6 92.4 90.4  --  89.0  PLT 55* 31* 22* 17* 24*  --  22*   Cardiac Enzymes: No results  for input(s): CKTOTAL, CKMB, CKMBINDEX, TROPONINI in the last 168 hours. BNP: Invalid input(s): POCBNP CBG: No results for input(s): GLUCAP in the last 168 hours. D-Dimer No results for input(s): DDIMER in the last 72 hours. Hgb A1c No results for input(s): HGBA1C in the last 72 hours. Lipid Profile No results for input(s): CHOL, HDL, LDLCALC, TRIG, CHOLHDL, LDLDIRECT in the last 72 hours. Thyroid function studies No results for input(s): TSH, T4TOTAL, T3FREE, THYROIDAB in the last 72 hours.  Invalid input(s): FREET3 Anemia work up No results for input(s): VITAMINB12, FOLATE, FERRITIN, TIBC, IRON, RETICCTPCT in the last 72 hours. Urinalysis    Component Value Date/Time   COLORURINE STRAW (A) 01/28/2021 1926   APPEARANCEUR CLEAR 01/28/2021 1926   LABSPEC 1.008 01/28/2021 1926   PHURINE 5.0 01/28/2021 1926   GLUCOSEU NEGATIVE 01/28/2021 1926   HGBUR MODERATE (A) 01/28/2021 1926   BILIRUBINUR NEGATIVE 01/28/2021 1926   KETONESUR NEGATIVE 01/28/2021 1926   PROTEINUR NEGATIVE 01/28/2021 1926   UROBILINOGEN 1.0 10/11/2014 1428   NITRITE NEGATIVE 01/28/2021 1926   LEUKOCYTESUR MODERATE (A) 01/28/2021 1926    Sepsis Labs Invalid input(s): PROCALCITONIN,  WBC,  LACTICIDVEN Microbiology Recent Results (from the past 240 hour(s))  Expectorated Sputum Assessment w Gram Stain, Rflx to Resp Cult     Status: None   Collection Time: 01/28/21 12:53 PM   Specimen: Sputum  Result Value Ref Range Status   Specimen Description SPU  Final   Special Requests NONE  Final   Sputum evaluation   Final    THIS SPECIMEN IS ACCEPTABLE FOR SPUTUM CULTURE Performed at [facility], [address] [place], [place] [address]    Report Status 01/28/2021 FINAL  Final  Culture, Respiratory w Gram Stain     Status: None   Collection Time: 01/28/21 12:53 PM   Specimen: Sputum  Result Value Ref Range Status   Specimen Description   Final    SPU Performed at [facility], [address] [place], [place] [address]    Special Requests   Final    NONE Reflexed from [id number] Performed at [facility], [address] [place], [place] [address]    Gram Stain   Final    FEW SQUAMOUS EPITHELIAL CELLS PRESENT MODERATE WBC PRESENT,BOTH PMN AND MONONUCLEAR ABUNDANT GRAM POSITIVE COCCI FEW GRAM NEGATIVE RODS RARE GRAM POSITIVE RODS    Culture   Final    FEW Consistent with normal respiratory flora. No Pseudomonas species isolated Performed at [facility] [address], [place], [place] [address]    Report Status 02/01/2021 FINAL  Final  Resp Panel by RT-PCR (Flu A&B, Covid) Nasopharyngeal Swab     Status: None   Collection Time: 01/28/21  4:30 PM   Specimen: Nasopharyngeal Swab; Nasopharyngeal(NP) swabs in vial transport medium  Result Value Ref Range Status   SARS Coronavirus 2 by RT PCR NEGATIVE NEGATIVE Final    Comment: (NOTE) SARS-CoV-2 target nucleic acids are NOT DETECTED.  The SARS-CoV-2 RNA is generally detectable in upper respiratory specimens during the acute phase of infection. The lowest concentration of SARS-CoV-2 viral copies this assay can detect is 138 copies/mL. A negative result does  not preclude SARS-Cov-2 infection and should not be used as the sole basis for treatment or other patient management decisions. A negative result may occur with  improper specimen collection/handling, submission of specimen other than nasopharyngeal swab, presence of viral mutation(s) within the areas targeted by this assay, and inadequate number of viral copies(<138 copies/mL). A  negative result must be combined with clinical observations, patient history, and epidemiological information. The expected result is Negative.  Fact Sheet for Patients:  EntrepreneurPulse.com.au  Fact Sheet for Healthcare Providers:  IncredibleEmployment.be  This test is no t yet approved or cleared by the [place] FDA and  has been authorized for detection and/or diagnosis of SARS-CoV-2 by FDA under an Emergency Use Authorization (EUA). This EUA will remain  in effect (meaning this test can be used) for the duration of the COVID-19 declaration under Section 564(b)(1) of the Act, 21 U.S.C.section 360bbb-3(b)(1), unless the authorization is terminated  or revoked sooner.       Influenza A by PCR NEGATIVE NEGATIVE Final   Influenza B by PCR NEGATIVE NEGATIVE Final    Comment: (NOTE) The Xpert Xpress SARS-CoV-2/FLU/RSV plus assay is intended as an aid in the diagnosis of influenza from Nasopharyngeal swab specimens and should not be used as a sole basis for treatment. Nasal washings and aspirates are unacceptable for Xpert Xpress SARS-CoV-2/FLU/RSV testing.  Fact Sheet for Patients: EntrepreneurPulse.com.au  Fact Sheet for Healthcare Providers: IncredibleEmployment.be  This test is not yet approved or cleared by the [place] FDA and has been authorized for detection and/or diagnosis of SARS-CoV-2 by FDA under an Emergency Use Authorization (EUA). This EUA will remain in effect (meaning this test can be used) for the  duration of the COVID-19 declaration under Section 564(b)(1) of the Act, 21 U.S.C. section 360bbb-3(b)(1), unless the authorization is terminated or revoked.  Performed at [facility], [address], [place], [place] [address]   MRSA Next Gen by PCR, Nasal     Status: None   Collection Time: 01/29/21  5:09 AM   Specimen: Nasal Mucosa; Nasal Swab  Result Value Ref Range Status   MRSA by PCR Next Gen NOT DETECTED NOT DETECTED Final    Comment: (NOTE) The GeneXpert MRSA Assay (FDA approved for NASAL specimens only), is one component of a comprehensive MRSA colonization surveillance program. It is not intended to diagnose MRSA infection nor to guide or monitor treatment for MRSA infections. Test performance is not FDA approved in patients less than 17 years old. Performed at [facility], [address] [place], [place] [address]      Time coordinating discharge: 35 minutes  SIGNED:   Rodena Goldmann, DO [facility] 02/03/2021, 11:45 AM  If 7PM-7AM, please contact night-coverage [email or web address]

## 2021-02-03 NOTE — TOC Transition Note (Signed)
Transition of Care [facility]) - CM/SW Discharge Note   Patient Details  Name: [name] MRN: [id number] Date of Birth: September 15, 1928  Transition of Care [facility]) CM/SW Contact:  Boneta Lucks, RN Phone Number: 02/03/2021, 1:34 PM   Clinical Narrative:   [name] ready [facility] patient at the [facility]. RN to call report. Med necessity printed on 300. TOC will call EMS when RN is ready.    Final next level of care: [place] Barriers to Discharge: Barriers Resolved   Patient Goals and CMS Choice Patient states their goals for this hospitalization and ongoing recovery are:: agreeable to Hospice CMS Medicare.gov Compare Post Acute Care list provided to:: Patient Represenative (must comment) Choice offered to / list presented to : Adult Children  Discharge Placement              Patient chooses bed at:  [facility]) Patient to be transferred to facility by: EMS   Patient and family notified of of transfer: 02/03/21  Discharge Plan and Services In-house Referral: Clinical Social Work             DME Arranged: N/A   Readmission Risk Interventions Readmission Risk Prevention Plan 01/29/2021  Transportation Screening Complete  HRI or Home Care Consult Complete  Social Work Consult for [place] Planning/Counseling Complete  Palliative Care Screening Not Applicable  Medication Review Press photographer) Complete  Some recent data might be hidden

## 2021-02-03 NOTE — Progress Notes (Signed)
Pt spo2 71% on room air, pt o2 cannula was disconnected from o2 bottle, cannula reconnected to o2 and spo2 back up to mid 90's with breathing treatment. Nurse informed

## 2021-02-03 NOTE — Progress Notes (Signed)
Palliative: [name] is resting comfortably in bed with her daughters at bedside.  She has been transition to full comfort care.  She appears relatively comfortable, but has some work of breathing noted.  Nursing staff requested for as needed symptom management.  Daughter/healthcare agent, [name], is at bedside.  We talked about residential hospice placement at [place] [facility].  It is anticipated that [name] would be transferring today.  We talked about symptom management for pain, anxiety, breathlessness.  Questions answered.  Orders reviewed and adjusted as needed.  Conference with attending, bedside nursing staff, transition of care team related to patient condition, needs, goals of care, disposition. DNR/goldenrod form on chart.  Plan: Comfort and dignity at end-of-life, residential hospice at [place]. Prognosis: 2 weeks or less is anticipated based on chronic illness burden including, but not limited to heart failure with fluid overload and hypoxia, GI bleed with unknown source and decreasing hemoglobin, anticipate related flare of fibromyalgia/RA due to inability to continue methotrexate.  61 minutes Quinn Axe, NP Palliative medicine team Team phone [phone] Greater than 50% of this time was spent counseling and coordinating care related to the above assessment and plan.

## 2021-02-03 NOTE — Care Management Important Message (Signed)
Important Message  Patient Details  Name: [name] MRN: [id number] Date of Birth: 1928/06/12   Medicare Important Message Given:  Yes  Late entry, given 02/02/21   Tommy Medal 02/03/2021, 12:02 PM

## 2021-02-11 ENCOUNTER — Other Ambulatory Visit: Payer: Self-pay | Admitting: Adult Health

## 2021-02-11 DEATH — deceased

## 2022-02-04 IMAGING — DX DG CHEST 2V
2 series · 2 of 2 positions shown · non-contrast
Comparison: September 29, 2017.

CLINICAL DATA: Cough, wheezing.

EXAM:
CHEST - 2 VIEW

[chest lat]
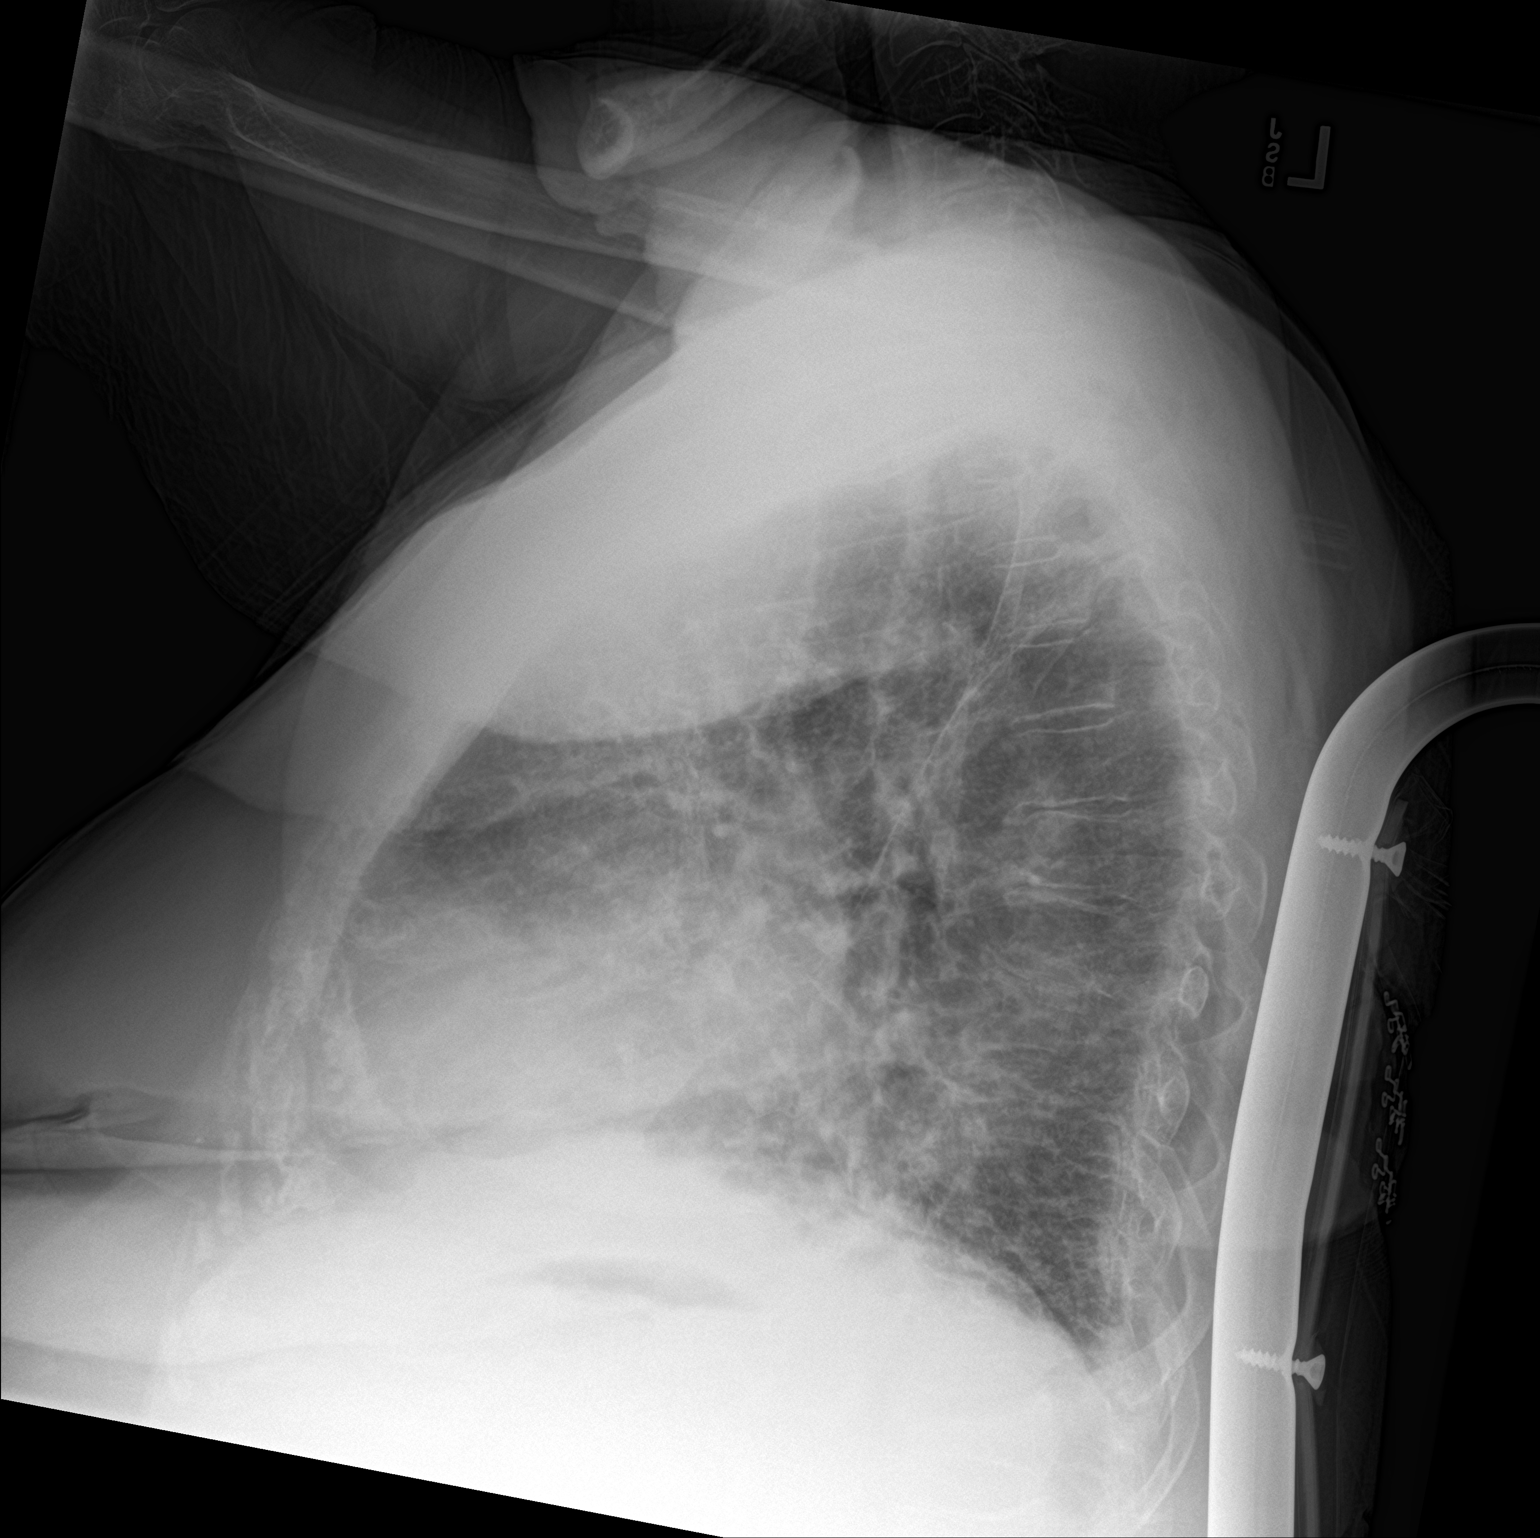

[chest ap]
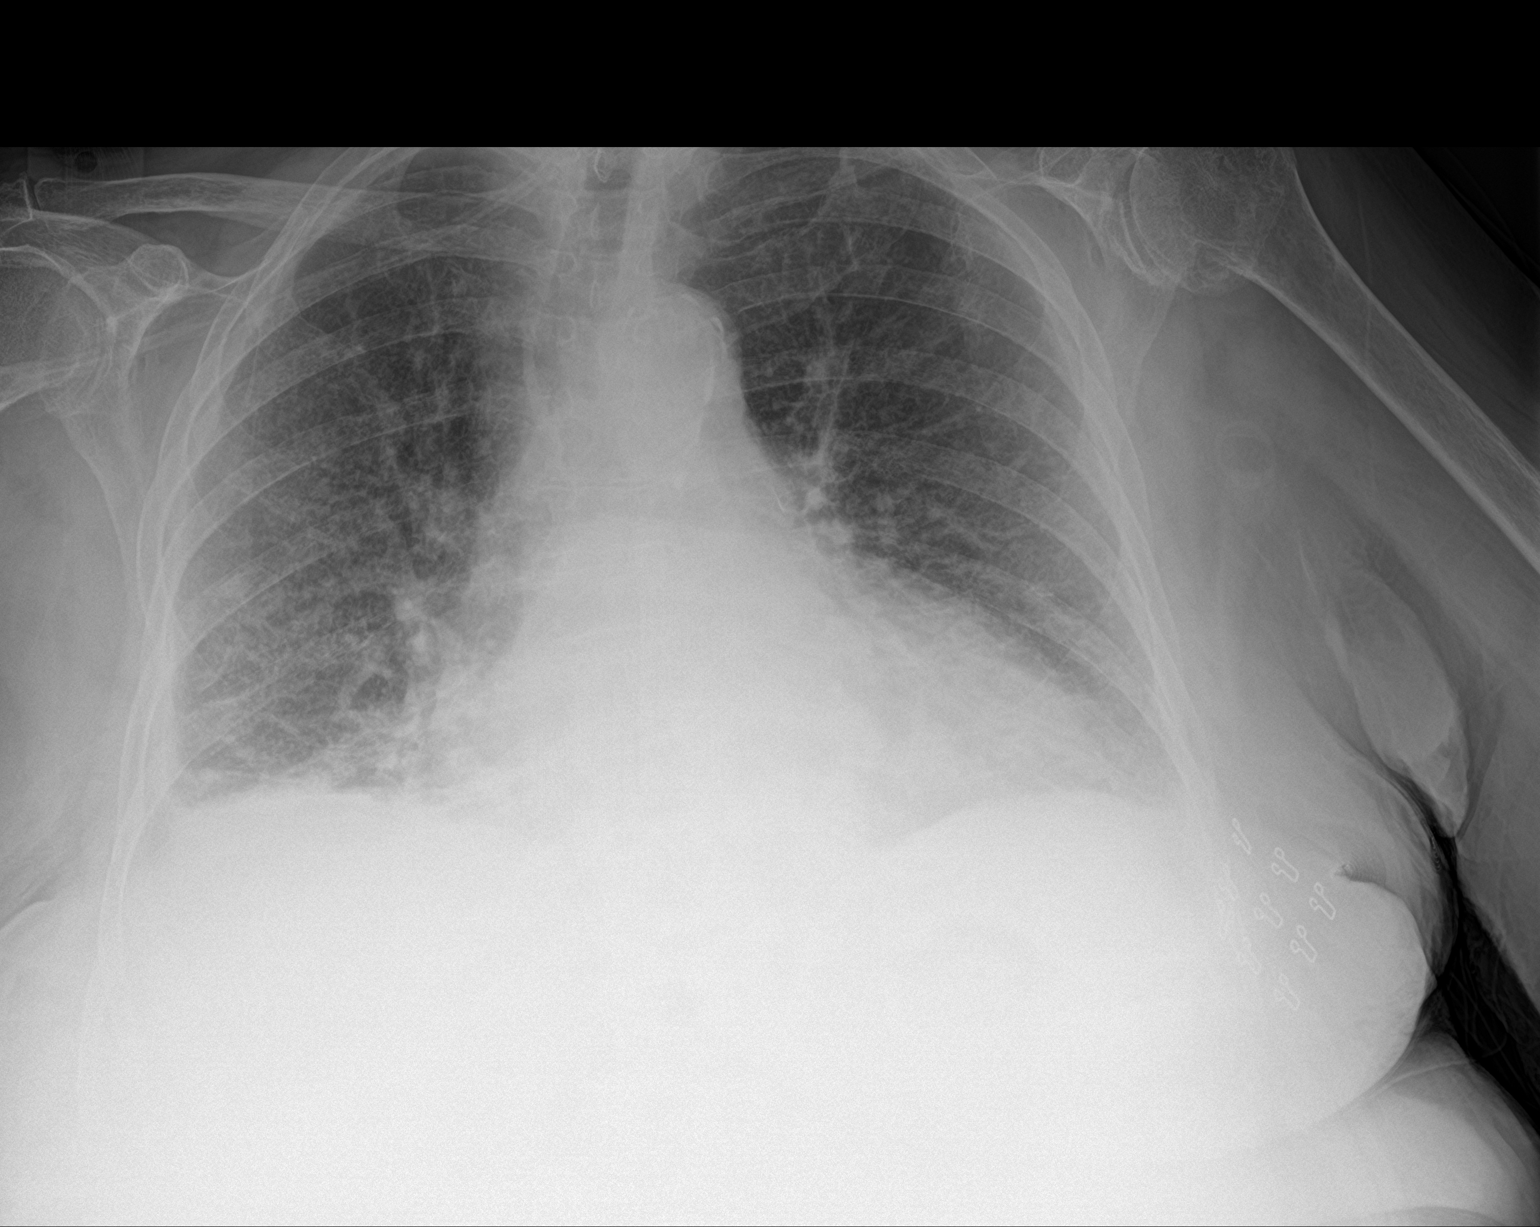

[2 of 2 positions shown; findings below may reference images not displayed]

FINDINGS: Stable cardiomegaly. No pneumothorax is noted. Mild bibasilar
subsegmental atelectasis or edema is noted. Small right pleural
effusion is noted. Bony thorax is unremarkable.
IMPRESSION: Mild bibasilar subsegmental atelectasis or edema is noted. Small
right pleural effusion is noted.

Aortic Atherosclerosis ([id number]).
